# Patient Record
Sex: Female | Born: 1960
Health system: Southern US, Community
[De-identification: ages and names within clinical notes are randomized; demographics above are authoritative.]

## PROBLEM LIST (undated history)

## (undated) DIAGNOSIS — E119 Type 2 diabetes mellitus without complications: Secondary | ICD-10-CM

## (undated) DIAGNOSIS — G43009 Migraine without aura, not intractable, without status migrainosus: Secondary | ICD-10-CM

## (undated) DIAGNOSIS — I2510 Atherosclerotic heart disease of native coronary artery without angina pectoris: Secondary | ICD-10-CM

## (undated) DIAGNOSIS — E785 Hyperlipidemia, unspecified: Secondary | ICD-10-CM

## (undated) DIAGNOSIS — I739 Peripheral vascular disease, unspecified: Secondary | ICD-10-CM

## (undated) DIAGNOSIS — I2699 Other pulmonary embolism without acute cor pulmonale: Secondary | ICD-10-CM

## (undated) DIAGNOSIS — F419 Anxiety disorder, unspecified: Secondary | ICD-10-CM

## (undated) DIAGNOSIS — R51 Headache: Secondary | ICD-10-CM

## (undated) DIAGNOSIS — F319 Bipolar disorder, unspecified: Secondary | ICD-10-CM

## (undated) DIAGNOSIS — I214 Non-ST elevation (NSTEMI) myocardial infarction: Secondary | ICD-10-CM

## (undated) DIAGNOSIS — I10 Essential (primary) hypertension: Secondary | ICD-10-CM

## (undated) HISTORY — DX: Peripheral vascular disease, unspecified: I73.9

## (undated) HISTORY — DX: Other pulmonary embolism without acute cor pulmonale: I26.99

## (undated) HISTORY — DX: Hyperlipidemia, unspecified: E78.5

## (undated) HISTORY — DX: Anxiety disorder, unspecified: F41.9

## (undated) HISTORY — DX: Essential (primary) hypertension: I10

## (undated) HISTORY — DX: Migraine without aura, not intractable, without status migrainosus: G43.009

## (undated) HISTORY — DX: Headache: R51

## (undated) HISTORY — DX: Non-ST elevation (NSTEMI) myocardial infarction: I21.4

## (undated) HISTORY — DX: Atherosclerotic heart disease of native coronary artery without angina pectoris: I25.10

---

## 1999-09-02 ENCOUNTER — Other Ambulatory Visit: Admission: RE | Admit: 1999-09-02 | Discharge: 1999-09-02 | Payer: Self-pay | Admitting: Family Medicine

## 2001-02-27 ENCOUNTER — Encounter: Payer: Self-pay | Admitting: Family Medicine

## 2001-02-27 ENCOUNTER — Encounter: Admission: RE | Admit: 2001-02-27 | Discharge: 2001-02-27 | Payer: Self-pay | Admitting: Family Medicine

## 2001-09-28 ENCOUNTER — Encounter: Payer: Self-pay | Admitting: Family Medicine

## 2001-09-28 ENCOUNTER — Encounter: Admission: RE | Admit: 2001-09-28 | Discharge: 2001-09-28 | Payer: Self-pay | Admitting: Family Medicine

## 2002-01-16 ENCOUNTER — Other Ambulatory Visit: Admission: RE | Admit: 2002-01-16 | Discharge: 2002-01-16 | Payer: Self-pay | Admitting: Obstetrics and Gynecology

## 2003-03-11 ENCOUNTER — Other Ambulatory Visit: Admission: RE | Admit: 2003-03-11 | Discharge: 2003-03-11 | Payer: Self-pay | Admitting: Obstetrics and Gynecology

## 2003-07-23 ENCOUNTER — Encounter (HOSPITAL_COMMUNITY): Admission: RE | Admit: 2003-07-23 | Discharge: 2003-10-17 | Payer: Self-pay | Admitting: Family Medicine

## 2004-04-02 ENCOUNTER — Other Ambulatory Visit: Admission: RE | Admit: 2004-04-02 | Discharge: 2004-04-02 | Payer: Self-pay | Admitting: Obstetrics and Gynecology

## 2004-04-24 ENCOUNTER — Emergency Department (HOSPITAL_COMMUNITY): Admission: EM | Admit: 2004-04-24 | Discharge: 2004-04-25 | Payer: Self-pay

## 2004-05-31 DIAGNOSIS — I2510 Atherosclerotic heart disease of native coronary artery without angina pectoris: Secondary | ICD-10-CM | POA: Insufficient documentation

## 2004-05-31 DIAGNOSIS — I25119 Atherosclerotic heart disease of native coronary artery with unspecified angina pectoris: Secondary | ICD-10-CM | POA: Insufficient documentation

## 2004-05-31 HISTORY — DX: Atherosclerotic heart disease of native coronary artery without angina pectoris: I25.10

## 2004-05-31 HISTORY — PX: CORONARY ARTERY BYPASS GRAFT: SHX141

## 2004-07-07 ENCOUNTER — Ambulatory Visit: Payer: Self-pay | Admitting: Cardiology

## 2004-07-07 ENCOUNTER — Inpatient Hospital Stay (HOSPITAL_COMMUNITY): Admission: EM | Admit: 2004-07-07 | Discharge: 2004-07-14 | Payer: Self-pay | Admitting: Emergency Medicine

## 2004-07-28 ENCOUNTER — Ambulatory Visit: Payer: Self-pay | Admitting: *Deleted

## 2004-10-13 ENCOUNTER — Ambulatory Visit: Payer: Self-pay | Admitting: Cardiology

## 2004-12-31 ENCOUNTER — Encounter: Admission: RE | Admit: 2004-12-31 | Discharge: 2004-12-31 | Payer: Self-pay | Admitting: Cardiothoracic Surgery

## 2005-01-03 ENCOUNTER — Emergency Department (HOSPITAL_COMMUNITY): Admission: EM | Admit: 2005-01-03 | Discharge: 2005-01-03 | Payer: Self-pay | Admitting: Emergency Medicine

## 2005-01-30 ENCOUNTER — Emergency Department (HOSPITAL_COMMUNITY): Admission: EM | Admit: 2005-01-30 | Discharge: 2005-01-30 | Payer: Self-pay | Admitting: Family Medicine

## 2005-03-03 IMAGING — CR DG CHEST 2V
2 series · 2 of 2 positions shown · non-contrast
Comparison: 07/07/04.

CLINICAL DATA: Preoperative respiratory exam.  Chest pain. 
 CHEST ? TWO VIEW:

[view not recorded (1 of 2)]
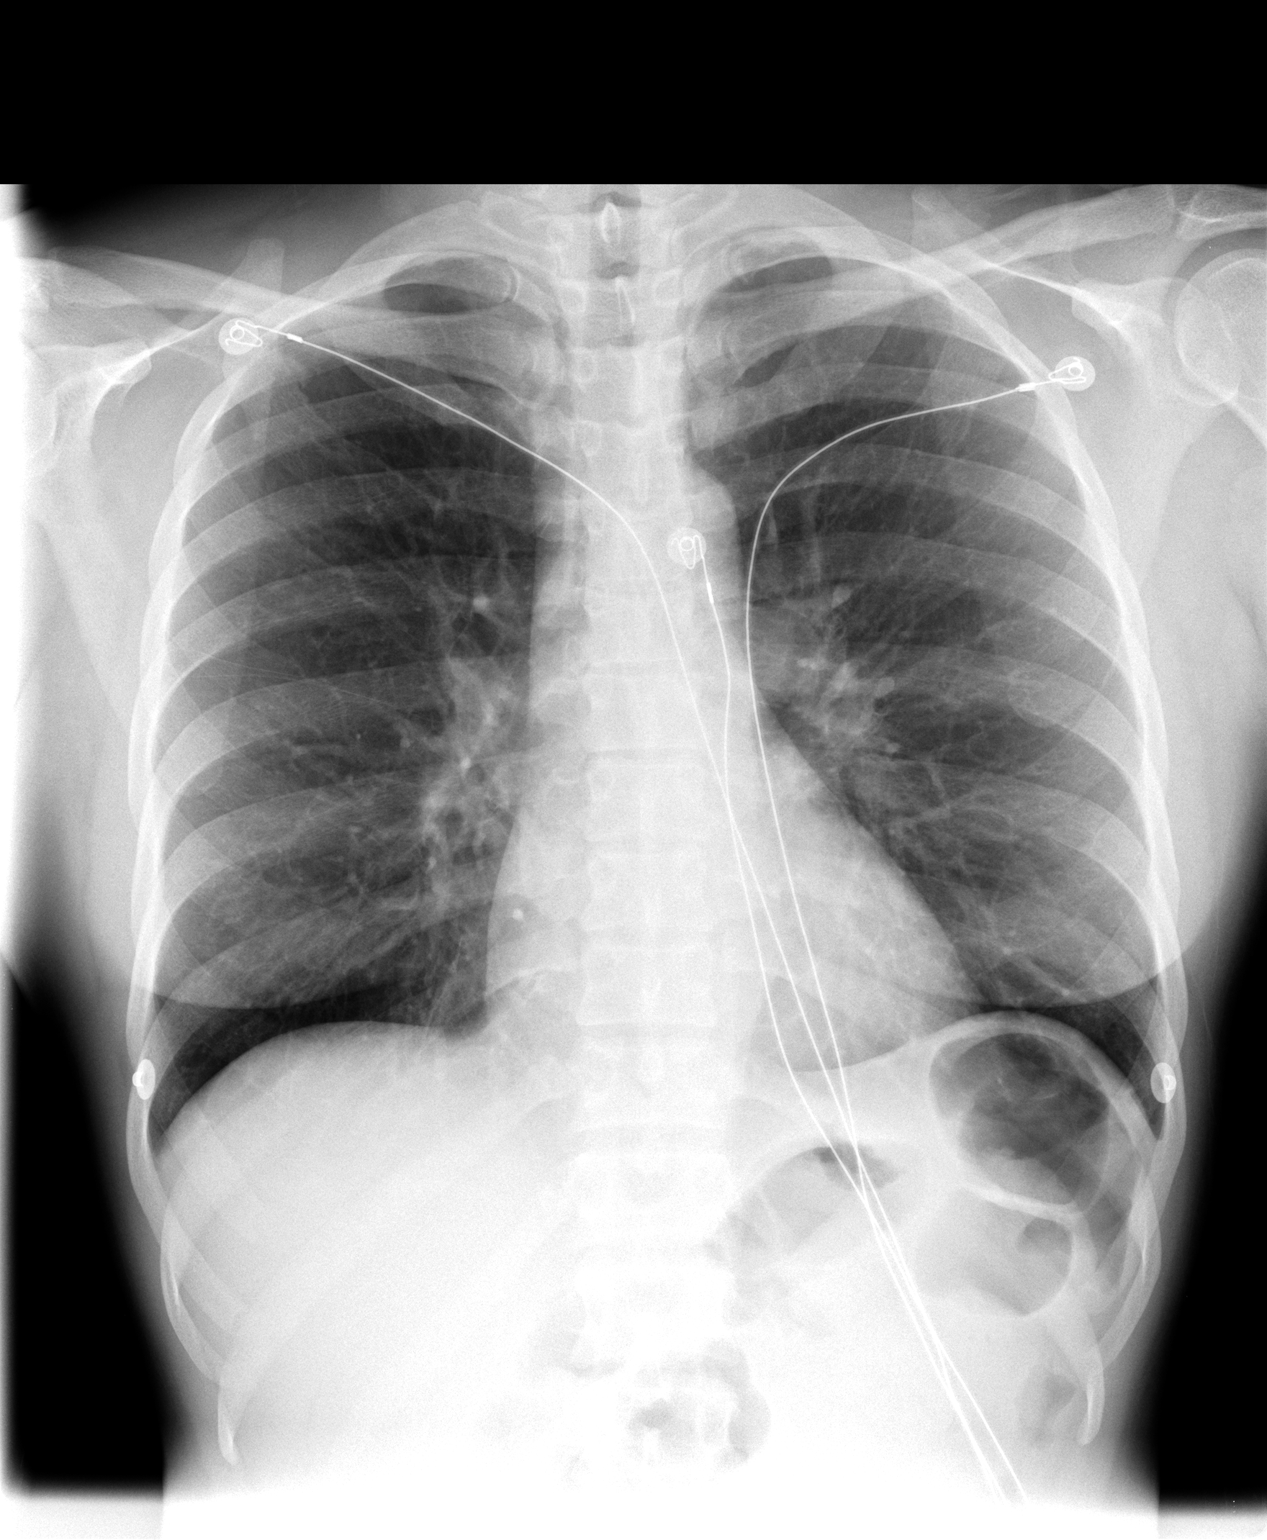

[view not recorded (2 of 2)]
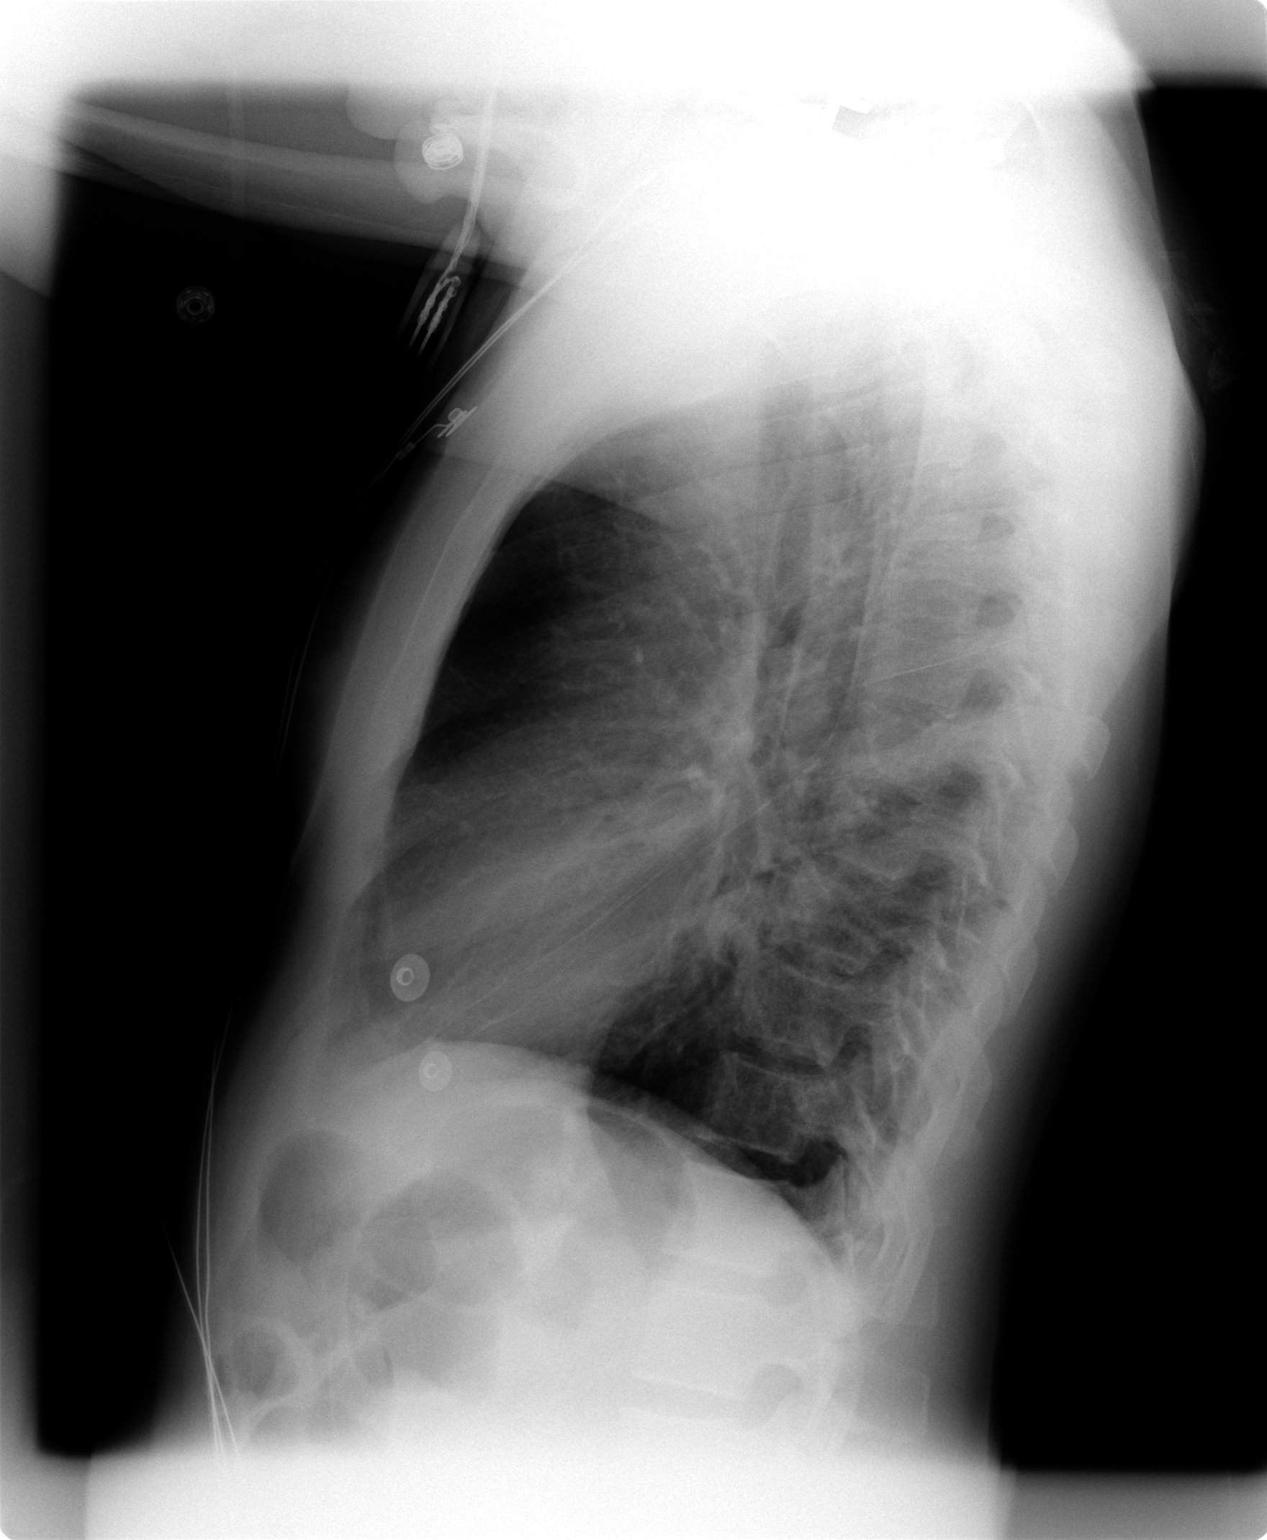

[2 of 2 positions shown; findings below may reference images not displayed]

Heart size and vascularity are normal and the lungs are clear.  No bony abnormality.
IMPRESSION: Normal chest.

## 2005-03-05 IMAGING — CR DG CHEST 1V PORT
1 series · 1 of 1 positions shown · non-contrast
Comparison: 07/09/04.

CLINICAL DATA: Postop CABG. 
 PORTABLE CHEST ONE VIEW 07/10/04:
 Exam 0666 hours.

[view not recorded]
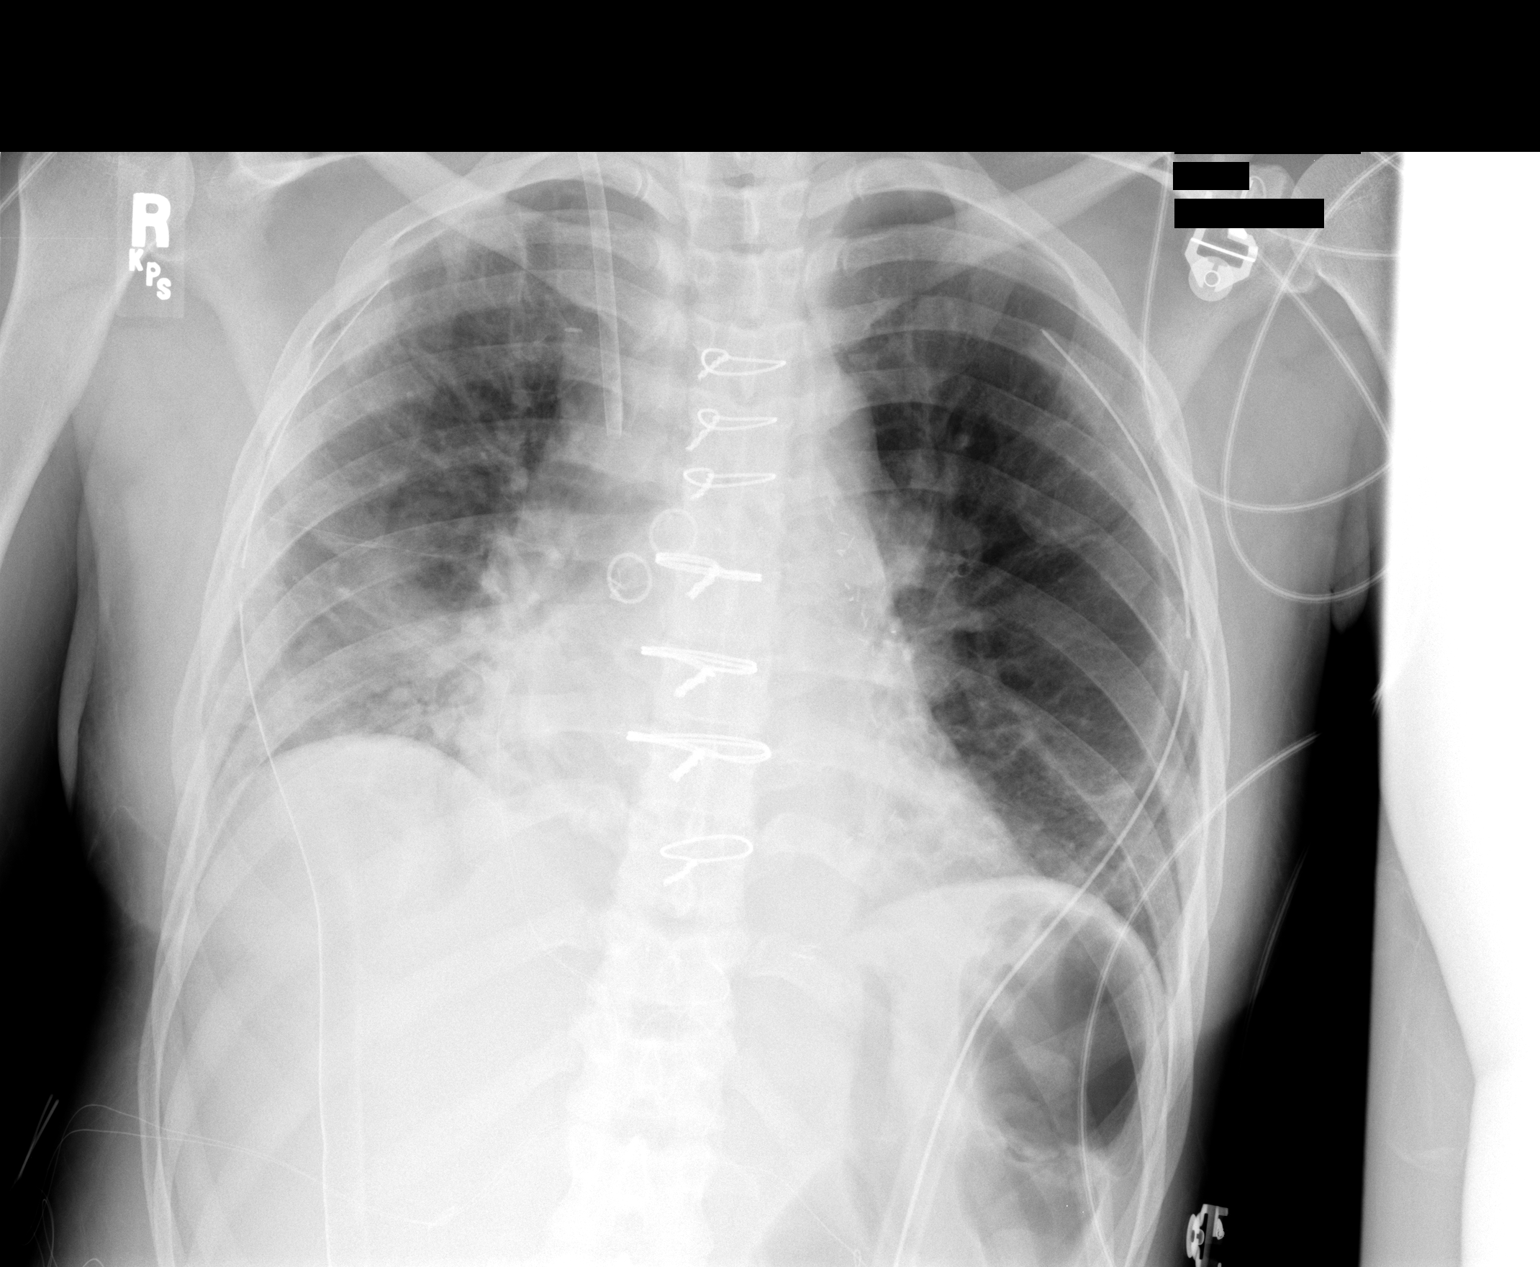

[1 of 1 positions shown; findings below may reference images not displayed]

FINDINGS: The Swan Ganz catheter and central chest tubes have been removed in the interval.  Lateral chest tubes remain bilaterally, and there is a right IJ sheath extending into the superior vena cava.  There is no pneumothorax.  Bilateral perihilar and lower lobe pulmonary opacities, remain, most compatible with atelectasis.  The aeration of the left lung base has somewhat improved.
IMPRESSION: Improving left basilar aeration and no pneumothorax following partial withdrawal of the support system.

## 2005-05-03 ENCOUNTER — Ambulatory Visit: Payer: Self-pay | Admitting: Cardiology

## 2005-05-22 ENCOUNTER — Emergency Department (HOSPITAL_COMMUNITY): Admission: EM | Admit: 2005-05-22 | Discharge: 2005-05-23 | Payer: Self-pay | Admitting: Emergency Medicine

## 2005-05-23 ENCOUNTER — Emergency Department (HOSPITAL_COMMUNITY): Admission: EM | Admit: 2005-05-23 | Discharge: 2005-05-23 | Payer: Self-pay | Admitting: *Deleted

## 2005-05-26 ENCOUNTER — Other Ambulatory Visit: Admission: RE | Admit: 2005-05-26 | Discharge: 2005-05-26 | Payer: Self-pay | Admitting: Obstetrics and Gynecology

## 2005-05-31 HISTORY — PX: CARDIAC CATHETERIZATION: SHX172

## 2005-06-23 ENCOUNTER — Ambulatory Visit: Payer: Self-pay | Admitting: Cardiology

## 2005-06-28 ENCOUNTER — Ambulatory Visit: Payer: Self-pay | Admitting: Cardiology

## 2005-06-30 ENCOUNTER — Inpatient Hospital Stay (HOSPITAL_BASED_OUTPATIENT_CLINIC_OR_DEPARTMENT_OTHER): Admission: RE | Admit: 2005-06-30 | Discharge: 2005-06-30 | Payer: Self-pay | Admitting: Cardiology

## 2005-06-30 ENCOUNTER — Ambulatory Visit: Payer: Self-pay | Admitting: Cardiology

## 2005-07-12 ENCOUNTER — Ambulatory Visit: Payer: Self-pay | Admitting: Internal Medicine

## 2005-07-23 ENCOUNTER — Ambulatory Visit: Payer: Self-pay | Admitting: Cardiology

## 2005-08-09 ENCOUNTER — Ambulatory Visit: Payer: Self-pay | Admitting: Internal Medicine

## 2005-09-28 ENCOUNTER — Ambulatory Visit: Payer: Self-pay | Admitting: Internal Medicine

## 2005-10-12 ENCOUNTER — Ambulatory Visit: Payer: Self-pay | Admitting: Internal Medicine

## 2005-10-21 ENCOUNTER — Encounter: Admission: RE | Admit: 2005-10-21 | Discharge: 2005-10-21 | Payer: Self-pay | Admitting: Cardiothoracic Surgery

## 2005-12-16 ENCOUNTER — Ambulatory Visit: Payer: Self-pay | Admitting: Cardiology

## 2005-12-31 ENCOUNTER — Ambulatory Visit (HOSPITAL_COMMUNITY): Admission: RE | Admit: 2005-12-31 | Discharge: 2005-12-31 | Payer: Self-pay | Admitting: Cardiology

## 2006-01-12 ENCOUNTER — Encounter: Admission: RE | Admit: 2006-01-12 | Discharge: 2006-01-12 | Payer: Self-pay | Admitting: Internal Medicine

## 2006-07-05 ENCOUNTER — Ambulatory Visit: Payer: Self-pay | Admitting: Cardiology

## 2006-07-05 LAB — CONVERTED CEMR LAB
ALT: 12 units/L (ref 0–40)
AST: 15 units/L (ref 0–37)
Albumin: 3.7 g/dL (ref 3.5–5.2)
Alkaline Phosphatase: 86 units/L (ref 39–117)
Bilirubin, Direct: 0.1 mg/dL (ref 0.0–0.3)
Cholesterol: 178 mg/dL (ref 0–200)
HDL: 44.2 mg/dL (ref >39.0)
LDL Cholesterol: 118 mg/dL — ABNORMAL HIGH (ref 0–99)
Total Bilirubin: 0.4 mg/dL (ref 0.3–1.2)
Total CHOL/HDL Ratio: 4
Total Protein: 6.9 g/dL (ref 6.0–8.3)
Triglycerides: 80 mg/dL (ref 0–149)
VLDL: 16 mg/dL (ref 0–40)

## 2006-08-15 ENCOUNTER — Ambulatory Visit: Payer: Self-pay | Admitting: Cardiology

## 2006-08-15 LAB — CONVERTED CEMR LAB
ALT: 13 units/L (ref 0–40)
AST: 22 units/L (ref 0–37)
Albumin: 3.7 g/dL (ref 3.5–5.2)
Alkaline Phosphatase: 77 units/L (ref 39–117)
Bilirubin, Direct: 0.1 mg/dL (ref 0.0–0.3)
Cholesterol: 125 mg/dL (ref 0–200)
HDL: 51.6 mg/dL (ref >39.0)
LDL Cholesterol: 63 mg/dL (ref 0–99)
Total Bilirubin: 0.5 mg/dL (ref 0.3–1.2)
Total CHOL/HDL Ratio: 2.4
Total Protein: 6.5 g/dL (ref 6.0–8.3)
Triglycerides: 54 mg/dL (ref 0–149)
VLDL: 11 mg/dL (ref 0–40)

## 2006-09-02 ENCOUNTER — Ambulatory Visit: Payer: Self-pay | Admitting: Internal Medicine

## 2006-09-02 LAB — CONVERTED CEMR LAB
T3, Free: 3.5 pg/mL (ref 2.3–4.2)
T4, Total: 7.2 ug/dL (ref 5.0–12.5)
TSH: 0.54 microintl units/mL (ref 0.35–5.50)

## 2006-10-08 ENCOUNTER — Emergency Department (HOSPITAL_COMMUNITY): Admission: EM | Admit: 2006-10-08 | Discharge: 2006-10-08 | Payer: Self-pay | Admitting: Emergency Medicine

## 2007-01-31 ENCOUNTER — Encounter: Payer: Self-pay | Admitting: *Deleted

## 2007-01-31 DIAGNOSIS — G43009 Migraine without aura, not intractable, without status migrainosus: Secondary | ICD-10-CM | POA: Insufficient documentation

## 2007-01-31 DIAGNOSIS — I2510 Atherosclerotic heart disease of native coronary artery without angina pectoris: Secondary | ICD-10-CM | POA: Insufficient documentation

## 2007-01-31 DIAGNOSIS — I10 Essential (primary) hypertension: Secondary | ICD-10-CM | POA: Diagnosis present

## 2007-01-31 DIAGNOSIS — E785 Hyperlipidemia, unspecified: Secondary | ICD-10-CM | POA: Insufficient documentation

## 2007-01-31 DIAGNOSIS — I25119 Atherosclerotic heart disease of native coronary artery with unspecified angina pectoris: Secondary | ICD-10-CM | POA: Insufficient documentation

## 2007-01-31 DIAGNOSIS — F411 Generalized anxiety disorder: Secondary | ICD-10-CM | POA: Insufficient documentation

## 2007-01-31 HISTORY — DX: Migraine without aura, not intractable, without status migrainosus: G43.009

## 2007-04-20 ENCOUNTER — Encounter: Admission: RE | Admit: 2007-04-20 | Discharge: 2007-04-20 | Payer: Self-pay | Admitting: Cardiothoracic Surgery

## 2007-04-20 ENCOUNTER — Ambulatory Visit: Payer: Self-pay | Admitting: Cardiothoracic Surgery

## 2007-06-09 ENCOUNTER — Emergency Department (HOSPITAL_COMMUNITY): Admission: EM | Admit: 2007-06-09 | Discharge: 2007-06-09 | Payer: Self-pay | Admitting: Emergency Medicine

## 2008-02-01 ENCOUNTER — Emergency Department (HOSPITAL_COMMUNITY): Admission: EM | Admit: 2008-02-01 | Discharge: 2008-02-01 | Payer: Self-pay | Admitting: Emergency Medicine

## 2008-02-08 ENCOUNTER — Emergency Department (HOSPITAL_COMMUNITY): Admission: EM | Admit: 2008-02-08 | Discharge: 2008-02-09 | Payer: Self-pay | Admitting: Emergency Medicine

## 2008-04-26 ENCOUNTER — Emergency Department (HOSPITAL_COMMUNITY): Admission: EM | Admit: 2008-04-26 | Discharge: 2008-04-26 | Payer: Self-pay | Admitting: Emergency Medicine

## 2008-06-12 DIAGNOSIS — R5383 Other fatigue: Secondary | ICD-10-CM

## 2008-06-12 DIAGNOSIS — R5381 Other malaise: Secondary | ICD-10-CM | POA: Insufficient documentation

## 2008-07-15 ENCOUNTER — Ambulatory Visit: Payer: Self-pay | Admitting: Cardiology

## 2008-07-18 ENCOUNTER — Ambulatory Visit: Payer: Self-pay | Admitting: Cardiology

## 2008-07-18 ENCOUNTER — Ambulatory Visit: Payer: Self-pay

## 2008-07-18 LAB — CONVERTED CEMR LAB
ALT: 16 units/L (ref 0–35)
AST: 16 units/L (ref 0–37)
Albumin: 4.1 g/dL (ref 3.5–5.2)
Alkaline Phosphatase: 87 units/L (ref 39–117)
Bilirubin, Direct: 0.1 mg/dL (ref 0.0–0.3)
Cholesterol: 216 mg/dL (ref 0–200)
Direct LDL: 145.2 mg/dL
HDL: 50.9 mg/dL (ref >39.0)
Total Bilirubin: 0.7 mg/dL (ref 0.3–1.2)
Total CHOL/HDL Ratio: 4.2
Total Protein: 7.2 g/dL (ref 6.0–8.3)
Triglycerides: 73 mg/dL (ref 0–149)
VLDL: 15 mg/dL (ref 0–40)

## 2008-09-23 ENCOUNTER — Emergency Department (HOSPITAL_COMMUNITY): Admission: EM | Admit: 2008-09-23 | Discharge: 2008-09-23 | Payer: Self-pay | Admitting: Emergency Medicine

## 2009-04-17 ENCOUNTER — Encounter (INDEPENDENT_AMBULATORY_CARE_PROVIDER_SITE_OTHER): Payer: Self-pay | Admitting: *Deleted

## 2009-07-11 LAB — HM MAMMOGRAPHY: HM Mammogram: NORMAL

## 2009-07-24 ENCOUNTER — Ambulatory Visit: Payer: Self-pay | Admitting: Cardiology

## 2009-08-08 ENCOUNTER — Telehealth: Payer: Self-pay | Admitting: Cardiology

## 2009-11-28 LAB — HM PAP SMEAR: HM Pap smear: NORMAL

## 2010-03-11 ENCOUNTER — Encounter: Admission: RE | Admit: 2010-03-11 | Discharge: 2010-03-11 | Payer: Self-pay | Admitting: Obstetrics and Gynecology

## 2010-06-20 ENCOUNTER — Encounter: Payer: Self-pay | Admitting: Obstetrics and Gynecology

## 2010-06-20 ENCOUNTER — Encounter: Payer: Self-pay | Admitting: Family Medicine

## 2010-06-21 ENCOUNTER — Encounter: Payer: Self-pay | Admitting: Internal Medicine

## 2010-06-21 ENCOUNTER — Encounter: Payer: Self-pay | Admitting: Obstetrics and Gynecology

## 2010-06-21 ENCOUNTER — Encounter: Payer: Self-pay | Admitting: Cardiothoracic Surgery

## 2010-06-30 NOTE — Assessment & Plan Note (Signed)
Summary: 1 YR F/U  Medications Added SIMVASTATIN 80 MG TABS (SIMVASTATIN) 1 by mouth daily      Allergies Added: NKDA  Visit Type:  Follow-up Primary Provider:  None  CC:  CAD.  History of Present Illness: The patient presents for followup. Since I last saw her she has had no new cardiovascular complaints. She is back to working. Her insurance with her new job has not yet kicked in. She still needs generic medications. She is exercising routinely. With her walker she does daily she is not getting any chest pressure, neck or arm discomfort. Not having any palpitations, presyncope or syncope. She's not having any PND or orthopnea. She denies any of the arm discomfort that was her previous angina. Of note I reviewed her lipid profile from last year. I had to switch her for cost considerations to simvastatin 80 mg. She has not yet had a repeat lipid profile. At that time her LDL was in the 140s.  Current Medications (verified): 1)  Simvastatin 80 Mg Tabs (Simvastatin) .Marland Kitchen.. 1 By Mouth Daily 2)  Toprol Xl 50 Mg  Tb24 (Metoprolol Succinate) .... One By Mouth Qd 3)  Adult Aspirin Low Strength 81 Mg  Tbdp (Aspirin) .... One By Mouth Qd 4)  Alprazolam 0.5 Mg  Tabs (Alprazolam) .... One Three Times A Day As Needed 5)  Nitroquick 0.4 Mg  Subl (Nitroglycerin) .... Take As Needed For Chest Pain  Allergies (verified): No Known Drug Allergies  Past History:  Past Surgical History: Coronary artery bypass   (The last catheterization was in January 2007.  The left main was normal, the LAD had focal 95% stenosis followed by mid long 75% stenosis.  The circumflex had proximal severe diffuse disease.  OM1 and OM2 had proximal severe diffuse disease.  The right coronary artery had 50% mid stenosis followed by long 70% stenosis of the PDA.  The PDA had proximal 80% stenosis.  LIMA to the LAD was widely patent, a free right internal mammary artery to the PDA was patent.  Saphenous vein graft and OM1  and OM2 sequential was widely patent, a free radial was anastomosed to the proximal segment of the saphenous vein graft.  The distal anastomosis was to the ramus intermediate.  It was free of high-grade disease.  The EF was 65%.),  Review of Systems       As stated in the HPI and negative for all other systems.   Vital Signs:  Patient profile:   50 year old female Height:      65 inches Weight:      138 pounds BMI:     23.05 Pulse rate:   71 / minute Resp:     16 per minute BP sitting:   108 / 64  (right arm)  Vitals Entered By: Marrion Coy, CNA (July 24, 2009 3:37 PM)  Physical Exam  General:  Well developed, well nourished, in no acute distress. Head:  normocephalic and atraumatic Eyes:  PERRLA/EOM intact; conjunctiva and lids normal. Mouth:  Teeth, gums and palate normal. Oral mucosa normal. Neck:  Neck supple, no JVD. No masses, thyromegaly or abnormal cervical nodes. Chest Wall:  well-healed sternotomy scar Lungs:  Clear bilaterally to auscultation and percussion. Abdomen:  Bowel sounds positive; abdomen soft and non-tender without masses, organomegaly, or hernias noted. No hepatosplenomegaly. Msk:  Back normal, normal gait. Muscle strength and tone normal. Extremities:  sore on the right radial region Neurologic:  Alert and oriented x 3. Skin:  Intact  without lesions or rashes. Cervical Nodes:  no significant adenopathy Axillary Nodes:  no significant adenopathy Inguinal Nodes:  no significant adenopathy Psych:  Normal affect.   Detailed Cardiovascular Exam  Neck    Carotids: Carotids full and equal bilaterally without bruits.      Neck Veins: Normal, no JVD.    Heart    Inspection: no deformities or lifts noted.      Palpation: normal PMI with no thrills palpable.      Auscultation: regular rate and rhythm, S1, S2 without murmurs, rubs, gallops, or clicks.    Vascular    Abdominal Aorta: no palpable masses, pulsations, or audible bruits.       Femoral Pulses: normal femoral pulses bilaterally.      Pedal Pulses: normal pedal pulses bilaterally.      Radial Pulses: absent right radial pulse, normal left      Peripheral Circulation: no clubbing, cyanosis, or edema noted with normal capillary refill.     EKG  Procedure date:  07/24/2009  Findings:      sinus rhythm, rate 71, axis within normal limits, intervals within normal limits, no acute ST-T wave changes.  Impression & Recommendations:  Problem # 1:  CORONARY ARTERY DISEASE (ICD-414.00) The patient is doing well aspect of this. She's had no new symptoms. She continues dissipated secondary risk reduction. At this point no change in therapy or further evaluation is indicated.  Problem # 2:  DYSLIPIDEMIA (ICD-272.4) She will come back in the next few days for a lipid profile. She understands the black box warning of simvastatin 80 mg but cannot afford alternatives and agrees to accept the small risk associated with this medicine. We have discussed this. If however she is not at target I will try to convince her even further to switch. I think is more dangerous to have her on no statin rather than 80 mg simvastatin.  Problem # 3:  HYPERTENSION (ICD-401.9) Herblood pressure is well controlled and she will continue to is as listed.  Patient Instructions: 1)  Your physician recommends that you schedule a follow-up appointment in: 12 months with Dr Antoine Poche 2)  Your physician recommends that you return for a FASTING lipid and liver profile: when you can  272.4  v58.69  3)  Your physician recommends that you continue on your current medications as directed. Please refer to the Current Medication list given to you today.

## 2010-06-30 NOTE — Progress Notes (Signed)
Summary: pt need somthing for pain   Phone Note Call from Patient Call back at Work Phone [phone]   Caller: Patient Summary of Call: Pt want something in for pain. Pain in right arm Initial call taken by: Judie Grieve,  August 08, 2009 3:47 PM  Follow-up for Phone Call        Unable to reach pt by phone, requested pt have pain evaluated by [facility] or Urgent Care unless she felt as though the pain is cardiac related in which case she could call MD on call which is Dr Antoine Poche, otherwise she should call back on Monday if there is anything else we can do. Follow-up by: Charolotte Capuchin, RN,  August 08, 2009 5:57 PM

## 2010-06-30 NOTE — Letter (Signed)
Summary: Appointment - Reminder 2  [facility], Main Office  [address]   [place], [place] [address]   Phone: [phone]  Fax: [phone]     April 17, 2009 MRN: [id number]   Bailey Greene [address] [place], [place]  [address]   Dear [name],  Our records indicate that it is time to schedule a follow-up appointment. Dr.Hochrein recommended that you follow up with [place] in Feb,2011 . It is very important that we reach you to schedule this appointment. We look forward to participating in your health care needs. Please contact us at the number listed above at your earliest convenience to schedule your appointment.  If you are unable to make an appointment at this time, give [place] a call so we can update our records.     Sincerely, GESILA,DAVIS  Glass blower/designer

## 2010-07-08 ENCOUNTER — Encounter: Payer: Self-pay | Admitting: Cardiology

## 2010-08-06 ENCOUNTER — Telehealth: Payer: Self-pay | Admitting: Cardiology

## 2010-08-11 NOTE — Progress Notes (Signed)
Summary: NEED TO KNOW IF PT NEED PREMEDS   Phone Note Other Incoming   Caller: DR.LUKE JOHNSON/ [phone] Summary of Call: PT HAVING TEETH PULLED AND DR.JOHNSON  NEED TO KNOW IF PT NEED PREMEDS BEFORE  DENTAL WORK Initial call taken by: Judie Grieve,  August 06, 2010 9:32 AM  Follow-up for Phone Call        I talked with Steward Drone at Dr [facility] Rummage is aware no indication according to [facility] recommendations for antibiotics prior to dental appt--pt's last OV here 07/24/09

## 2010-08-11 NOTE — Letter (Signed)
Summary: [employer]/Black Box Warning  [employer]/Black Box Warning   Imported By: Erle Crocker 08/07/2010 16:07:39  _____________________________________________________________________  External Attachment:    Type:   Image     Comment:   External Document

## 2010-09-07 ENCOUNTER — Other Ambulatory Visit: Payer: Self-pay | Admitting: Cardiology

## 2010-09-07 MED ORDER — SIMVASTATIN 80 MG PO TABS: 80.0000 mg | ORAL_TABLET | Freq: Every day | ORAL | Status: DC

## 2010-09-20 ENCOUNTER — Emergency Department (HOSPITAL_COMMUNITY)
Admission: EM | Admit: 2010-09-20 | Discharge: 2010-09-20 | Disposition: A | Discharge status: Home / Self Care | Payer: Managed Care, Other (non HMO) | Attending: Emergency Medicine | Admitting: Emergency Medicine

## 2010-09-20 DIAGNOSIS — Z79899 Other long term (current) drug therapy: Secondary | ICD-10-CM | POA: Insufficient documentation

## 2010-09-20 DIAGNOSIS — R6889 Other general symptoms and signs: Secondary | ICD-10-CM | POA: Insufficient documentation

## 2010-09-20 DIAGNOSIS — J3489 Other specified disorders of nose and nasal sinuses: Secondary | ICD-10-CM | POA: Insufficient documentation

## 2010-09-20 DIAGNOSIS — R51 Headache: Secondary | ICD-10-CM | POA: Insufficient documentation

## 2010-09-20 DIAGNOSIS — I2510 Atherosclerotic heart disease of native coronary artery without angina pectoris: Secondary | ICD-10-CM | POA: Insufficient documentation

## 2010-09-20 DIAGNOSIS — E785 Hyperlipidemia, unspecified: Secondary | ICD-10-CM | POA: Insufficient documentation

## 2010-09-20 DIAGNOSIS — K089 Disorder of teeth and supporting structures, unspecified: Secondary | ICD-10-CM | POA: Insufficient documentation

## 2010-09-20 DIAGNOSIS — I10 Essential (primary) hypertension: Secondary | ICD-10-CM | POA: Insufficient documentation

## 2010-09-20 DIAGNOSIS — K029 Dental caries, unspecified: Secondary | ICD-10-CM | POA: Insufficient documentation

## 2010-09-22 ENCOUNTER — Encounter: Payer: Self-pay | Admitting: Cardiology

## 2010-09-22 ENCOUNTER — Encounter: Payer: Self-pay | Admitting: *Deleted

## 2010-09-23 ENCOUNTER — Ambulatory Visit (INDEPENDENT_AMBULATORY_CARE_PROVIDER_SITE_OTHER): Payer: Managed Care, Other (non HMO) | Admitting: Cardiology

## 2010-09-23 ENCOUNTER — Encounter: Payer: Self-pay | Admitting: Cardiology

## 2010-09-23 VITALS — BP 120/70 | HR 82 | Ht 65.0 in | Wt 146.0 lb

## 2010-09-23 DIAGNOSIS — I10 Essential (primary) hypertension: Secondary | ICD-10-CM

## 2010-09-23 DIAGNOSIS — E785 Hyperlipidemia, unspecified: Secondary | ICD-10-CM

## 2010-09-23 DIAGNOSIS — E010 Iodine-deficiency related diffuse (endemic) goiter: Secondary | ICD-10-CM | POA: Insufficient documentation

## 2010-09-23 DIAGNOSIS — I2510 Atherosclerotic heart disease of native coronary artery without angina pectoris: Secondary | ICD-10-CM

## 2010-09-23 DIAGNOSIS — E049 Nontoxic goiter, unspecified: Secondary | ICD-10-CM

## 2010-09-23 NOTE — Assessment & Plan Note (Signed)
>>  ASSESSMENT AND PLAN FOR ESSENTIAL HYPERTENSION WRITTEN ON 09/23/2010  3:38 PM BY HOCHREIN, JAMES, MD  Her blood pressure is controlled and she will continue meds as listed.

## 2010-09-23 NOTE — Assessment & Plan Note (Signed)
Her blood pressure is controlled and she will continue meds as listed. 

## 2010-09-23 NOTE — Assessment & Plan Note (Signed)
She will come back for fasting lipid profile with a goal LDL less than 100 and HDL greater than 40.

## 2010-09-23 NOTE — Assessment & Plan Note (Signed)
There has been some time since her last catheterization (2007). She's not exercising as much as I would like. I would like to screen her with an exercise treadmill test and give her a prescription for exercise.

## 2010-09-23 NOTE — Progress Notes (Signed)
HPI The patient presents for followup of her known coronary disease. Since I last saw her she has had no new cardiovascular complaints. She has been exercising occasionally. She is not getting it chest pressure, neck or arm discomfort. She did not have any palpitations, presyncope or syncope. She is not having any PND or orthopnea. She has had no weight gain or edema. She was supposed to come back last year for lipids but apparently this was not done.  Allergies no known allergies  Current Outpatient Prescriptions  Medication Sig Dispense Refill  . metoprolol (TOPROL-XL) 50 MG 24 hr tablet Take 50 mg by mouth daily.        . simvastatin (ZOCOR) 80 MG tablet Take 80 mg by mouth at bedtime.        Marland Kitchen aspirin 81 MG tablet Take 81 mg by mouth daily.        . nitroGLYCERIN (NITROLINGUAL) 0.4 MG/SPRAY spray Place 1 spray under the tongue every 5 (five) minutes as needed.        . simvastatin (ZOCOR) 80 MG tablet Take 1 tablet (80 mg total) by mouth at bedtime.  30 tablet  4    Past Medical History  Diagnosis Date  . HTN (hypertension)   . Anxiety   . CAD (coronary artery disease)   . Dyslipidemia   . Headache     Past Surgical History  Procedure Date  . Coronary artery bypass graft      Coronary artery bypass grafting x5 with a left  internal  mammary to the left anterior descending coronary artery.  Free right  internal mammary to the diagonal coronary artery, sequential reverse  saphenous vein graft to the first and second obtuse marginal, right  artery bypass to the posterior descending coronary artery with endo-vein harvesting.    ROS:  As stated in the HPI and negative for all other systems.  PHYSICAL EXAM BP 120/70  Pulse 82  Ht 5\' 5"  (1.651 m)  Wt 146 lb (66.225 kg)  BMI 24.30 kg/m2 GENERAL:  Well appearing HEENT:  Pupils equal round and reactive, fundi not visualized, oral mucosa unremarkable, dentures NECK:  No jugular venous distention, waveform within normal limits, carotid  upstroke brisk and symmetric, no bruits, positive diffuse thyromegaly LYMPHATICS:  No cervical, inguinal adenopathy LUNGS:  Clear to auscultation bilaterally BACK:  No CVA tenderness CHEST:  Unremarkable HEART:  PMI not displaced or sustained,S1 and S2 within normal limits, no S3, no S4, no clicks, no rubs, no murmurs ABD:  Flat, positive bowel sounds normal in frequency in pitch, no bruits, no rebound, no guarding, no midline pulsatile mass, no hepatomegaly, no splenomegaly EXT:  2 plus pulses throughout except no right radial pulse, no edema, no cyanosis no clubbing, right radial scar SKIN:  No rashes no nodules NEURO:  Cranial nerves II through XII grossly intact, motor grossly intact throughout PSYCH:  Cognitively intact, oriented to person place and time   EKG:  Sinus rhythm, rate 82, axis within normal limits, intervals within normal limits, no acute ST-T wave changes.  ASSESSMENT AND PLAN

## 2010-09-23 NOTE — Patient Instructions (Signed)
You are being scheduled for a treadmill.  On that same day please have a fasting lipid panel and TSH You are being scheduled for an ultrasound of your neck due to the goiter you have Continue current medications

## 2010-09-23 NOTE — Assessment & Plan Note (Signed)
She will get a TSH and thyroid ultrasound.

## 2010-10-05 ENCOUNTER — Telehealth: Payer: Self-pay | Admitting: Internal Medicine

## 2010-10-05 NOTE — Telephone Encounter (Signed)
I cannot write a note to excuse her from work for one week for this problem. She should contact her primary care for an appt and to discuss the headache and the letter.

## 2010-10-05 NOTE — Telephone Encounter (Signed)
Pt would like to speak nurse concerning the lump on her neck.

## 2010-10-05 NOTE — Telephone Encounter (Signed)
Per pt call - states she had to go home from work today d/t a severe h/a and the only thing she notices that is different is the "lump in her neck".  Pt is scheduled for a thyroid u/s on Wednesday and wants a note to stay out of work for one week.  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  Pt is determined her headache is coming from her enlarged thyroid and continues to ask for a note to remain out of work until after the testing.  I instructed her that I will ask Dr Antoine Poche about it and call her back with recommendations.  She states understanding.

## 2010-10-06 NOTE — Telephone Encounter (Signed)
Pt aware to contact her primary care MD for evaluation of h/a and for out of work note

## 2010-10-07 ENCOUNTER — Other Ambulatory Visit (INDEPENDENT_AMBULATORY_CARE_PROVIDER_SITE_OTHER): Payer: Managed Care, Other (non HMO) | Admitting: *Deleted

## 2010-10-07 ENCOUNTER — Ambulatory Visit (HOSPITAL_COMMUNITY)
Admission: RE | Admit: 2010-10-07 | Discharge: 2010-10-07 | Disposition: A | Discharge status: Home / Self Care | Payer: Managed Care, Other (non HMO) | Source: Ambulatory Visit | Attending: Cardiology | Admitting: Cardiology

## 2010-10-07 ENCOUNTER — Encounter: Payer: Managed Care, Other (non HMO) | Admitting: *Deleted

## 2010-10-07 ENCOUNTER — Ambulatory Visit (INDEPENDENT_AMBULATORY_CARE_PROVIDER_SITE_OTHER): Payer: Managed Care, Other (non HMO) | Admitting: Cardiology

## 2010-10-07 DIAGNOSIS — I2510 Atherosclerotic heart disease of native coronary artery without angina pectoris: Secondary | ICD-10-CM

## 2010-10-07 DIAGNOSIS — I10 Essential (primary) hypertension: Secondary | ICD-10-CM

## 2010-10-07 DIAGNOSIS — E785 Hyperlipidemia, unspecified: Secondary | ICD-10-CM

## 2010-10-07 DIAGNOSIS — E049 Nontoxic goiter, unspecified: Secondary | ICD-10-CM | POA: Insufficient documentation

## 2010-10-07 LAB — TSH: TSH: 0.54 u[IU]/mL (ref 0.35–5.50)

## 2010-10-07 LAB — LIPID PANEL
Cholesterol: 163 mg/dL (ref 0–200)
HDL: 53.8 mg/dL (ref >39.00)
LDL Cholesterol: 94 mg/dL (ref 0–99)
Total CHOL/HDL Ratio: 3
Triglycerides: 74 mg/dL (ref 0.0–149.0)
VLDL: 14.8 mg/dL (ref 0.0–40.0)

## 2010-10-07 NOTE — Progress Notes (Signed)
Exercise Treadmill Test  Pre-Exercise Testing Evaluation Rhythm: sinus tachycardia  Rate: 114   PR:  .14 QRS:  .08  QT:  .32 QTc: .44     Test  Exercise Tolerance Test Ordering MD: Angelina Sheriff, MD  Interpreting MD:  Angelina Sheriff, MD  Unique Test No: 1  Treadmill:  1  Indication for ETT: CAD  Contraindication to ETT: No   Stress Modality: exercise - treadmill  Cardiac Imaging Performed: non   Protocol: standard Bruce - maximal  Max BP:  161/89  Max MPHR (bpm): 173 85% MPR (bpm):  147  MPHR obtained (bpm):  171 % MPHR obtained: 98  Reached 85% MPHR (min:sec): 3:50 Total Exercise Time (min-sec):  7:00  Workload in METS:  8.5 Borg Scale: 15  Reason ETT Terminated:  desired heart rate attained    ST Segment Analysis At Rest: normal ST segments - no evidence of significant ST depression With Exercise: no evidence of significant ST depression  Other Information Arrhythmia:  No Angina during ETT:  She did have some atypical chest discomfor mild. Quality of ETT:  diagnostic  ETT Interpretation:  normal - no evidence of ischemia by ST analysis  Comments: The patient had an reasonable exercise tolerance.  There was atypical chest pain.  There was an appropriate level of dyspnea.  There were no arrhythmias, a normal heart rate response and normal BP response.  There were no ischemic ST T wave changes and a normal heart rate recovery.  She did reach her target heart rate early in stage II.  Recommendations: Negative adequate ETT.  No further testing is indicated.  Based on the above I gave the patient a prescription for exercise.

## 2010-10-13 NOTE — Assessment & Plan Note (Signed)
[facility]                            CARDIOLOGY OFFICE NOTE   Bailey Greene, Bailey Greene            MRN:          [id number]  DATE:07/15/2008                            DOB:          26-Aug-1960    REASON FOR PRESENTATION:  Evaluate the patient with coronary artery  disease and arm pain.   HISTORY OF PRESENT ILLNESS:  This patient returns after a couple of year  absence.  She had coronary artery disease with bypass grafting in 2007.  However, she lost her job and her insurance, and so she has not followed  up with anybody.  She does still take her medications.  She has not been  exercising as much as I would like.  She is now working for [employer] in  [place].   She is concerned because she has been having some left arm discomfort.  This has been similar to what she had prior to her bypass.  It has not  been as severe.  It happens at rest.  It is moderate-to-mild discomfort.  It may last all day when it happens.  There is no associated chest  discomfort.  There is no associated nausea, vomiting, or diaphoresis.  It goes away on its own.  She was getting this sporadically, but has not  had any within a week.  She denies any PND or orthopnea.  She had no  palpitation, presyncope, or syncope.   PAST MEDICAL HISTORY:  Coronary artery disease (status post CABG.  The  last catheterization was in January 2007.  The left main was normal, the  LAD had focal 95% stenosis followed by mid long 75% stenosis.  The  circumflex had proximal severe diffuse disease.  OM1 and OM2 had  proximal severe diffuse disease.  The right coronary artery had 50% mid  stenosis followed by long 70% stenosis of the PDA.  The PDA had proximal  80% stenosis.  LIMA to the LAD was widely patent, a free right internal  mammary artery to the PDA was patent.  Saphenous vein graft and OM1 and  OM2 sequential was widely patent, a free radial was anastomosed to the  proximal segment  of the saphenous vein graft.  The distal anastomosis  was to the ramus intermediate.  It was free of high-grade disease.  The  EF was 65%.), previous tobacco use, and dyslipidemia.   ALLERGIES:  None.   MEDICATIONS:  1. Toprol 50 mg daily.  2. Lipitor 40 mg daily.  3. Aspirin 81 mg daily.   REVIEW OF SYSTEMS:  As stated in the HPI and otherwise negative for all  other systems.   PHYSICAL EXAMINATION:  GENERAL:  The patient is pleasant and in no  distress.  VITAL SIGNS:  Blood pressure 116/76, heart rate 89 and regular, weight  139 pounds, body mass index 23.  HEENT:  Eyelids unremarkable; pupils equal, round, and reactive to  light; fundi not visualized; oral mucosa unremarkable.  NECK:  No jugular venous distention at 90 degrees; carotid upstroke  brisk and symmetric; no bruits, no thyromegaly.  LYMPHATICS:  No cervical, axillary, or inguinal adenopathy.  LUNGS:  Clear to auscultation bilaterally.  BACK:  No costovertebral angle tenderness.  CHEST:  Unremarkable except for a well-healed sternotomy scar.  HEART:  PMI not displaced or sustained; S1 and S2 within normal limits;  no S3, no S4; no clicks, no rubs, no murmurs.  ABDOMEN:  Flat; positive bowel sounds, normal in frequency and pitch; no  bruits, no rebound, no guarding; no midline pulsatile mass; no  hepatomegaly, no splenomegaly.  SKIN:  No rashes, no nodules.  EXTREMITIES:  Left radial 2+ pulse, absent right radial, dorsalis pedis  2+, posterior tibialis 2+; no cyanosis, no clubbing, no edema.  NEURO:  Oriented to person, place, and time; cranial nerves II-XII  grossly intact; motor grossly intact.   EKG; sinus rhythm, rate 89, axis within normal limits, intervals within  normal limits, no acute ST-T wave changes.   ASSESSMENT AND PLAN:  1. Coronary artery disease.  The patient does have some arm discomfort      similar to previous angina.  She needs a stress perfusion study to      further evaluate her coronary  anatomy.  The possibility of      obstructive coronary artery disease causing these symptoms is at      least moderately high.  She will have an exercise perfusion study.  2. Dyslipidemia.  She has not had her lipids checked in a while.  Her      last lipid profile in 2008 demonstrated total 125, triglycerides      54, HDL 51, and LDL 63.  This was at target.  However, she wants to      switch to generic, so I am going to switch her simvastatin 80 mg      daily.  I will get a lipid profile when she comes back for her      fasting lipid or for her stress test.  3. Tobacco.  She is not smoking.  She is encouraged not to pick them      up again.  4. Headaches.  The patient did have an ER visit for headaches in      September.  These are no longer a problem.  5. Followup.  I will see her again in 1 year or sooner if she has any      problems.     Rollene Rotunda, MD, [facility]  Electronically Signed    JH/MedQ  DD: 07/15/2008  DT: 07/16/2008  Job #: [id number]

## 2010-10-13 NOTE — Assessment & Plan Note (Signed)
OFFICE VISIT   Bailey Greene, Bailey Greene  DOB:  August 20, 1960                                        April 20, 2007  CHART #:  [id number]   The patient returns today in follow-up after her coronary artery bypass  grafting x5 done in 07/2004.  During her evaluation, a 4 mm right lower  lobe, noncalcified nodule was noted in 07/2004 and again in 12/2004.  It  was a low probability scan.  The patient has been a smoker in the past  but quit more than 20 years ago.  From a cardiac standpoint, she has  been doing well.  She has had no recurrent angina or evidence of  congestive heart failure.  She has had no hemoptysis.   PHYSICAL EXAMINATION:  VITAL SIGNS:  Blood pressure is 125/81, pulse 96,  respiratory rate 18, O2 saturation 99%.  CHEST:  Her sternum is stable  and well healed.  RIGHT UPPER EXTREMITY:  The right forearm incision  from the radial artery harvest reveals she has developed a keloid in the  upper portion of it.  She had been referred to plastic surgery for this  and had some treatment with medication, but she does not remember what  it was.  Re-excision of the scar has not been attempted, but that could  be a consideration if it continues to bother her.   Today, a repeat CT scan was done and shows a stable 4 mm nodule in the  medial right lower lobe that has been followed and has been stable for  more than 2 years.  At this point, no further CT scanning to follow this  is recommended.  We discussed the findings with the patient.  I plan to  see her back p.r.n.   Sheliah Plane, MD  Electronically Signed   EG/MEDQ  D:  04/20/2007  T:  04/20/2007  Job:  [id number]   cc:   Rollene Rotunda, MD, [facility]  Lynett Fish, M.D.

## 2010-10-16 NOTE — Cardiovascular Report (Signed)
Bailey Greene, Bailey Greene NO.:  [id number]   MEDICAL RECORD NO.:  [id number]          PATIENT TYPE:  OIB   LOCATION:  1962                         FACILITY:  [facility]   PHYSICIAN:  Rollene Rotunda, M.D.   DATE OF BIRTH:  1960/07/09   DATE OF PROCEDURE:  06/30/2005  DATE OF DISCHARGE:                              CARDIAC CATHETERIZATION   PRIMARY CARE PHYSICIAN:  Thomos Lemons, D.O.   PROCEDURE:  Left heart catheterization/coronary arteriography.   INDICATIONS FOR PROCEDURE:  Evaluate patient with chest pain and previous  CABG.   PROCEDURE:  Left heart catheterization performed via the right femoral  artery.  The artery was cannulated using the anterior wall puncture.  A #4  French arterial sheath was inserted via the modified Seldinger technique.  Preformed Judkins and pigtail catheter were utilized.  Patient tolerated the  procedure well and left the lab in stable condition.   RESULTS:  Hemodynamics:  AO 95/72, LV 95/12.   Coronaries:  Left main was normal.  The LAD had severe diffuse proximal disease with focal 95% stenosis  compromising septal perforators.  There was long mid 75% stenosis.  The LAD  distal wrapped the apex.  It consisted of two small diagonals.  These  vessels were free of disease.  Circumflex in the AV groove was normal.  There was a small ramus  intermediate (referred to as a diagonal in the operative note) that had  proximal severe diffuse disease, OM-1 and OM-2 both had proximal severe  diffuse disease.  All three of these vessels were seen to fill via grafts.  The right coronary artery was a long dominant vessel.  It had proximal mid  long 50% stenosis.  There was a distal long 70% stenosis before the PDA.  The proximal portion of the PDA had 80% stenosis.  The distal vessel was  free of high grade disease and seemed to fill via a graft.   Grafts:  LIMA to the LAD was widely patent.  Free RIMA to the PDA was patent, though somewhat narrow  in caliber.  Saphenous vein graft sequential to OM-1 and OM-2 was widely patent.  A free radial appeared to be anastomosed at its proximal segment to the  origin of the saphenous vein graft.  The distal anastomosis was to the ramus intermediate (diagonal).  This was a  small graft but was patent and free of high grade disease.   Left ventriculogram:  The left ventriculogram was obtained in the RAO  projection.  EF was 65% with normal wall motion.   CONCLUSION:  Severe three vessel coronary artery disease.  Patent grafts.  Well preserved ejection fraction.   PLAN:  The patient will continue to have medical management and aggressive  risk reduction.           ______________________________  Rollene Rotunda, M.D.     JH/MEDQ  D:  06/30/2005  T:  06/30/2005  Job:  [id number]   cc:   Thomos Lemons, D.O. [facility]  [address] [place], [place] [address]

## 2010-10-16 NOTE — Consult Note (Signed)
NAME:  Bailey Greene, Bailey Greene   ACCOUNT NO.:  [id number]   MEDICAL RECORD NO.:  [id number]          PATIENT TYPE:  INP   LOCATION:  1824                         FACILITY:  [facility]   PHYSICIAN:  Rollene Rotunda, M.D.   DATE OF BIRTH:  1961-04-03   DATE OF CONSULTATION:  07/07/2004  DATE OF DISCHARGE:                                   CONSULTATION   PRIMARY CARE PHYSICIAN:  Dr. Mosetta Putt.   REASON FOR CONSULTATION:  Evaluate patient with arm pain.   HISTORY OF PRESENT ILLNESS:  The patient is a lovely 50 year old African-  American female with no prior cardiac history.  She said that two days prior  to presentation, she noticed left arm discomfort.  It started down near her  thumb and traveled up her left arm and into her upper chest.  It would come  and go, lasting several minutes at a time.  It was not provokable.  It was  not brought on by movement or activity.  It occurred at rest.  It was  severe, 10/10.  She did not have it yesterday for the most part.  However,  last night when she was getting ready to go to bed, she noticed it, and it  was again very severe.  It kept her up.  She finally presented to the  emergency room at 3:30 when it did not go away.  There, she was not noted to  have diagnostic EKG changes, though there was very minimal ST elevation in  the anterior leads and subtle T wave inversion in the anterior leads.  She  was given sublingual nitroglycerin, and she thinks this helped.  She was  also treated with morphine and Dilaudid and now is completely painfree.  We  are now consulted.  She is on heparin.  Her cardiac markers are elevated as  described below.   She says that she had no prior history before this.  She does not exercise  routinely, but she does activities of daily living without bringing on any  chest discomfort, neck discomfort, arm discomfort or activity-induced  nausea, vomiting or excessive diaphoresis.  She had no palpitations,  presyncope or syncope.   She says the discomfort that she was having is unlike any discomfort in the  past.  It is a sharp, throbbing discomfort.  It is 10/10 without associated  nausea, vomiting, diaphoresis or shortness of breath.  There has been  radiation to her upper chest but nowhere else.  She has had no jaw  discomfort.   PAST MEDICAL HISTORY:  The patient has no history of hypertension, diabetes  or hyperlipidemia.  She does have tobacco abuse.   PAST SURGICAL HISTORY:  None.   ALLERGIES:  None.   MEDICATIONS:  Triphasil birth control pills and a muscle relaxant (exact  medication unknown).   SOCIAL HISTORY:  The patient smokes 10-12 cigarettes and has done so for  about 16 years.  She is married.  She has one adult daughter, 56 years old.  She adopted her 29 year old niece.  She works outside the home.  She does  not use any illicit drugs.   FAMILY HISTORY:  Contributory for her father having his first myocardial  infarction in his 30s and dying of coronary artery disease at age 94.  Her  mother died with lupus.   REVIEW OF SYSTEMS:  As stated is in the HPI and otherwise positive for  spasms of her lumbar back.   PHYSICAL EXAMINATION:  GENERAL APPEARANCE:  The patient is in no distress.  VITAL SIGNS:  Blood pressure 105/71, heart rate 100 and regular, afebrile.  HEENT:  Eyes are unremarkable.  Pupils are equal, round and reactive to  light.  Fundi are not visualized.  Oral mucosa unremarkable.  NECK:  No jugular venous distention.  Wave form within normal limits.  Carotid upstrokes brisk and symmetric.  No bruits.  No thyromegaly.  LYMPHATIC:  No cervical, axillary or inguinal adenopathy.  LUNGS:  Clear to auscultation bilaterally.  BACK:  No costovertebral angle tenderness.  CHEST:  Unremarkable.  HEART:  PMI nondisplaced and sustained.  S1 and S2 within normal limits.  No  S3.  No S4.  No murmurs.  ABDOMEN:  Flat.  Positive bowel sounds, normal in frequency and  pitch.  No  bruits, rebound, guarding or midline pulsatile mass.  No hepatomegaly or  splenomegaly.  SKIN:  No rashes.  No nodules.  EXTREMITIES:  Two-plus pulses throughout.  No edema, cyanosis or clubbing.  NEURO:  Oriented to person, place and time.  Cranial nerves II-XII grossly  intact.  Motor grossly intact.   LABORATORY:  Sodium 139, potassium 3.7, chloride 106, BUN 8, creatinine 0.9.  WBC 10.6, hemoglobin 9.8, platelets 392.  Occult fecal blood negative.  Troponins 0.22/0.25, peak CK/MB 253/10.4 with an index of 4.1.   Chest x-ray pending.   ASSESSMENT/PLAN:  1.  Chest discomfort/elevated enzymes/abnormal EKG.  The patient has an      acute coronary syndrome.  She is being managed with heparin and aspirin,      and I will use low-dose beta blockers.  She will be taken to the      catheterization lab.  Risks and benefits have been described.  We will      check a urine pregnancy test, though she is on birth control pills and      denies being pregnant.  2.  Anemia.  The patient is slightly anemic with a negative fecal occult      blood.  We will follow this and check iron.  3.  Risk reduction.  We will get a smoking cessation consult.  We will check      a lipid panel.      JH/MEDQ  D:  07/07/2004  T:  07/07/2004  Job:  [id number]   cc:   Mosetta Putt, M.D.  [address] [place]  [place] [address]  Fax: [phone]

## 2010-10-16 NOTE — Discharge Summary (Signed)
[name], [name] NO.:  [id number]   MEDICAL RECORD NO.:  [id number]          PATIENT TYPE:  INP   LOCATION:  2006                         FACILITY:  [facility]   PHYSICIAN:  Sheliah Plane, MD    DATE OF BIRTH:  May 15, 1961   DATE OF ADMISSION:  07/07/2004  DATE OF DISCHARGE:                                 DISCHARGE SUMMARY   PRIMARY DIAGNOSIS:  Coronary artery disease.   SECONDARY DISCHARGE DIAGNOSIS:  Tobacco abuse.   IN-HOSPITAL OPERATIONS/PROCEDURES:  1.  Cardiac catheterization on July 07, 2004.  2.  Coronary artery bypass grafting x5 with a left internal mammary artery      to the left anterior descending artery, a inferior right internal      mammary to the diagonal coronary artery, a sequential reverse saphenous      vein graft to the first and second obtuse marginal, and a right radial      artery bypass to the posterior descending coronary artery.   HISTORY AND PHYSICAL AND HOSPITAL COURSE:  Bailey Greene is a 50 year old  female who presented with nausea and shortness of breath of approximately 1  month's duration with exertion.  Two days ago, she noted the onset of left  arm primarily but with radiation to the chest.  She attributed this to  carpal tunnel, she does a lot of sitting and typing at a computer.  However,  the pain came on Sunday, 2 days ago, and then again yesterday.  It returned  last evening.  She came to the emergency room on July 07, 2004.  Total CK  was 253 with an MB of 10.4.  EKG was consistent with a questionable septal  infarct.  Because of the symptoms, the patient was given nitro with some  relief and became pain-free after starting heparin.  She underwent cardiac  catheterization on July 07, 2004.  This showed an ejection fraction of 45  to 50%.  There was mid distal anterolateral wall apical and distal inferior  wall hypokinesis.  The left main was not significant.  The left anterior  descending showed a 70%  lesion, followed by 95% segmental disease.  The  circumflex showed a proximal 50%, followed by mid 80% obstruction.  The  right coronary artery had a long 60 to 70% mid lesion, followed by a 60%  narrowing proximal to the PDA.  For details of the patient's past medical  history and physical examination please see dictated history and physical.   On July 08, 2004, Ms. Bessard underwent coronary artery bypass grafting  x5 with a left internal mammary artery to the left anterior descending,  inferior right internal mammary to diagonal coronary artery, sequential  reverse saphenous vein grafts to the first and second obtuse marginal, and a  right radial artery bypass to the posterior descending artery.  The patient  tolerated the procedure well and was transferred up to the intensive care  unit in stable condition.   Postoperatively, the patient was extubated within 24 hours after surgery.  The patient had an unremarkable postoperative course.  She did have some  sinus tachycardia associated  with hypotension.  The patient was monitored  and placed on a Lopressor regimen.  Her blood pressure did elevate and her  heart rate remained slightly tachycardic in the high 90s and low 100s.  The  remainder of her hospital stay was unremarkable.  She was discharged to home  on postoperative day #6 in stable condition.  The patient was ambulating  well, positive postoperative bowel movement, appetite within normal limits.  At discharge, all of her incisions were dry and intact and healing well.   Bailey Greene received instructions on diet, activity level and care of her  incisions.  She was told that she was restricted to drive until released to  do so and no heavy lifting over 10 pounds.  She acknowledged an  understanding.  Bailey Greene was also educated on tobacco cessation since she  does have a history of tobacco abuse.  Currently, the patient has a nicotine  patch, but states that she is doing to  attempt cold [place].  A follow-up  appointment was made with Dr. Tyrone Sage for 3 weeks, July 30, 2004.  A follow-  up appointment is to be made with Ms. Laurin Coder cardiologist for 2 weeks.   DISCHARGE MEDICATIONS:  1.  Aspirin 325 mg p.o. daily.  2.  Toprol XL 25 mg p.o. daily.  3.  Lipitor 20 mg p.o. q.h.s.  4.  Tylox q.4h. p.r.n. pain.  5.  Lasix 40 mg p.o. daily x7 days.  6.  Potassium chloride 40 mEq p.o. daily x7 days.   Currently, we will hold off on starting the patient on an ACE inhibitor due  to episodes of hypotension.   Bailey Greene was given instructions if she develops any drainage or opening  of any of her incisions or any fevers, she is to contact us.  The patient  again acknowledged an understanding.      KMD/MEDQ  D:  07/13/2004  T:  07/13/2004  Job:  [id number]

## 2010-10-16 NOTE — H&P (Signed)
NAME:  Bailey Greene, Bailey Greene   ACCOUNT NO.:  [id number]   MEDICAL RECORD NO.:  [id number]          PATIENT TYPE:  EMS   LOCATION:  MAJO                         FACILITY:  [facility]   PHYSICIAN:  Gertha Calkin, M.D.DATE OF BIRTH:  November 18, 1960   DATE OF ADMISSION:  07/07/2004  DATE OF DISCHARGE:                                HISTORY & PHYSICAL   PRIMARY CARE PHYSICIAN:  Mosetta Putt, M.D.   HISTORY OF PRESENT ILLNESS:  The patient is a 50 year old pleasant African-  American female who has no significant past medical history, who presents  with atypical chest pain.  This chest pain began initially as her usual  carpal tunnel pain in the left forearm earlier yesterday morning but then  around 11:15 last night moved to her chest.  She described the pain as  sharp, nonradiating, had no associated nausea, vomiting, diaphoresis,  palpitations, neck or jaw tenderness.  Her risk factors include positive  family history.  Father had a triple bypass at age 48.  Also her father had  hypertension, diabetes.  Currently she is a tobacco user.  Unknown lipid  profile index.   PAST MEDICAL HISTORY:  None other than some lower back pain and occasional  headaches.   MEDICATIONS:  Over-the-counter ibuprofen and Motrin.  She states that she  has been using ibuprofen for her headaches and back pains approximately for  the past the three years but not routine daily use.  She denies any reflux  symptoms.   ALLERGIES:  None.   SOCIAL HISTORY:  She is married, has no alcohol or IV drug use.  Positive  tobacco, approximately half a pack a week.  She is in [employer].   FAMILY HISTORY:  Positive for father MI at 28 requiring triple bypass  surgery.  She has one brother and two sisters, no medical issues known to  her.  She lives in [place].   REVIEW OF SYSTEMS:  No reflux, headaches, blurred vision, weight changes,  night sweats, nausea, vomiting, constipation, diarrhea, no hematemesis,  hemoptysis, hematochezia or melanotic stools.  No abdominal pain, no GU  symptoms.  Other review of systems negative.  No travel history.  No recent  history of trauma or falls.  No recent surgeries.  Positive for birth  control pills.   PHYSICAL EXAMINATION:  VITAL SIGNS:  Temperature 98.2, blood pressure  118/80, pulse of 98, respiratory rate 20, satting 98% room air.  GENERAL:  She is in no acute distress at time of exam.  HEENT:  Unremarkable.  CARDIOVASCULAR:   Regular rate and rhythm, no murmurs, rubs or gallops.  CHEST:  Clear to auscultation bilaterally with good air movement.  ABDOMEN:  Soft, nontender, nondistended. No organomegaly.  No CVA  tenderness.  Positive bowel sounds.  RECTAL:  Good rectal tone, minimal stool in vault.  Heme negative.  EXTREMITIES:  Without cyanosis, clubbing or edema.  Peripheral pulses are  2+, symmetric upper and lower extremities.  NEUROLOGIC:  Cranial nerves 2 through 12 are intact.  No focal strength  deficits.  No focal sensation deficits.   LABORATORY DATA:  Chest x-ray:  No acute disease, no cardiomegaly.  CT chest  negative for pulmonary embolus or infiltrates.  EKG:  Normal sinus rhythm;  however, there are Q-waves in the anterior leads and T-wave inversion in V1  and V2 and EKG changes consistent with left atrial enlargement.   i-STAT:  creatinine was 0.9.  Point of care markers positive troponin I of  0.22, high normal is 0.05.  i-STAT sodium is 139, potassium 3.7, chloride  106, BUN of 8, glucose 117, bicarb 25, hemoglobin 12.2, hematocrit 36, PTT  of 29.   ASSESSMENT/PLAN:  1.  Atypical chest pain.  2.  Tobacco abuse.  3.  Deep venous thrombosis prophylaxis and GI prophylaxis.   Plan is to admit to tele.  Rule out with enzymes, serial EKGs, check TSH,  fasting lipid profile and 2-D echocardiogram given her abnormal EKG.  Will  empirically start heparin since she is heme negative and follow up with  labs.  Provide tobacco  cessation and nicotine patch during  hospitalization.  Will consult with cardiology if needed.      JD/MEDQ  D:  07/07/2004  T:  07/07/2004  Job:  [id number]   cc:   Mosetta Putt, M.D.  [address] [place]  [place] [address]  Fax: [phone]

## 2010-10-16 NOTE — Cardiovascular Report (Signed)
NAMENEYSA, ARTS NO.:  [id number]   MEDICAL RECORD NO.:  [id number]          PATIENT TYPE:  INP   LOCATION:  1824                         FACILITY:  [facility]   PHYSICIAN:  Arturo Morton. Riley Kill, M.D. [facility] OF BIRTH:  [birth year]   DATE OF PROCEDURE:  07/07/2004  DATE OF DISCHARGE:                              CARDIAC CATHETERIZATION   INDICATIONS:  This is a 50 year old who presents with positive enzymes,  abnormal EKG. She was brought to the catheterization laboratory for urgent  evaluation.   PROCEDURE:  1.  Left heart catheterization.  2.  Selective coronary arteriography.  3.  Selective left ventriculography.  4.  Subclavian angiography.   PROCEDURE:  The patient was brought to the cath lab, prepped and draped in  usual fashion.  Through an anterior puncture, her right femoral artery was  easily entered, a 6-French sheath was placed. Ventriculography was performed  in the RAO projection, and left coronary arteriography was then performed.  During the middle of the procedure, the drapes came up off of the leg, and  the right femoral sheath was then replaced with double-glove technique.  Following this, RCA angiography was performed, followed by bilateral  subclavian angiography. She tolerated the procedure without complication.  The femoral sheath was sewn into place.  Dr. Antoine Poche came to the  laboratory, and it was a consensus opinion that revascularization surgery  should be warranted. I called the CVTS office, and Verlon Au made arrangements  to contact Dr. Tyrone Sage to see her promptly. Intravenous heparin was then  administered, and femoral sheath was sewn into place. She was taken to the  holding area.  I reviewed the films with the family in the viewing room in  detail.   HEMODYNAMIC DATA:  1.  Central aortic pressure 100/72, mean 86.  2.  Left ventricle 99/15.  3.  No gradient pullback across the aortic valve.   ANGIOGRAPHIC DATA:  .  1.   Ventriculography was performed in the RAO projection. Estimated ejection      fraction would be in the 45-50% range. There is mid distal anterolateral      wall, apical and distal inferior wall hypokinesis.  2.  The left main is free of critical disease.  3.  Left anterior descending artery demonstrates 30% proximal narrowing.      There is then diffuse narrowing of 70% crossing the origin of the      diagonal and then a 95% area of segmental disease. There is 70%      narrowing of the large diagonal branch. This represents second diagonal.  4.  The circumflex demonstrates a subtotally occluded small first marginal.      Distal to this, the vessel then trifurcates.  The superior branch has      about 50% narrowing, then 80% mid stenosis. The inferior branch has 40%      ostial narrowing and then probably about 70% mid eccentric stenosis. The      posterolateral branch is smaller and has 75-80% narrowing.  5.  The right coronary artery is a vessel that provides some      collateralization  to the LAD system and has long 60-70% mid-narrowing,      60% narrowing proximal to the PDA.  The proximal PDA has 40%, then there      is a 50% area of narrowing going into the posterolateral system.   CONCLUSION:  1.  Mild reduction in left ventricular function.  2.  Severe critical three-vessel coronary artery disease.   DISPOSITION:  The patient needs revascularization surgery. Surgical consult  was being obtained.      TDS/MEDQ  D:  07/07/2004  T:  07/07/2004  Job:  [id number]   cc:   Rollene Rotunda, M.D.   Mosetta Putt, M.D.  [address] [place]  [place] [address]  Fax: [phone]   CV Lab

## 2010-10-16 NOTE — Letter (Signed)
August 26, 2008    Bailey Greene. Bailey Overlie, MD  [address]  [place], [place] [address]   RE:  Bailey Greene  MRN:  [id number]  /  DOB:  09/08/1960   Dear Dr. Marcelle Greene:   This is a letter concerning Bailey Greene.  The patient is  under my care for coronary disease status post bypass surgery.  She did  have some chest discomfort earlier this year.  However, she had a stress  perfusion study in February that demonstrated a well-preserved ejection  fraction of 69% with no evidence of ischemia or infarct.  Therefore,  based on this and according to ACC/AHA guidelines, the patient is at  acceptable risk for planned hysterectomy.  She should continue the  medications as listed.   If you have any questions about this patient, please do not hesitate to  give me a call.  My cell phone number is [phone].  My beeper number is  [phone].    Sincerely,      Rollene Rotunda, MD, [facility]  Electronically Signed    JH/MedQ  DD: 08/26/2008  DT: 08/27/2008  Job #: [id number]

## 2010-10-16 NOTE — Consult Note (Signed)
NAMESAVANA, SPINA NO.:  [id number]   MEDICAL RECORD NO.:  [id number]          PATIENT TYPE:  INP   LOCATION:  1824                         FACILITY:  [facility]   PHYSICIAN:  Sheliah Plane, MD    DATE OF BIRTH:  11/10/1960   DATE OF CONSULTATION:  07/07/2004  DATE OF DISCHARGE:                                   CONSULTATION   REFERRING PHYSICIAN:  Arturo Morton. Riley Kill, M.D.   PRIMARY CARE PHYSICIAN:  Mosetta Putt, M.D.   REASON FOR CONSULTATION:  Severe coronary artery disease.   HISTORY OF PRESENT ILLNESS:  The patient is a 50 year old female who  presented with mild shortness of breath of approximately 1 month's duration  with exertion.  Two days ago, she noted the onset of left arm pain  primarily, but with radiation into the chest.  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  However, the pain came and went on Sunday, 2 days ago, and then  again yesterday.  It returned last night.  She came to the emergency room  this morning.  Total CK was 253 with MB of 10.4.  EKG was consistent with a  question of a septal infarct.  Because of the symptoms, she was given  nitroglycerin with some relief of symptoms and then became pain free after  starting heparin.  She underwent cardiac catheterization today.  She has no  previous cardiac history.  She has no known hypertension, no known diabetes.  She does smoke x12-13 years with approximately 10 cigarettes per day.  She  is unaware of her lipid status.   FAMILY HISTORY:  Father had myocardial infarction at age 109.  Her mother  died of lupus.  She has one brother age 70, two sisters 63 and 56.  One  sister has lupus and diabetes.  One sister has lupus and sickle cell.   PAST MEDICAL HISTORY:  The patient has had no previous stroke, no  claudication or renal insufficiency.  She has had no previous  hospitalizations and no other medical problems.   PAST  SURGICAL HISTORY:  None.   SOCIAL HISTORY:  She has two children ages 19 and 64.  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  She is married.   MEDICATIONS:  1.  Birth control pills only.  2.  Occasional Advil.   ALLERGIES:  No known drug allergies.   REVIEW OF SYSTEMS:  CARDIOVASCULAR:  Positive for chest pain and mild  exertional shortness of breath.  She denies resting shortness of breath,  denies orthopnea, presyncope, syncope, palpitations or lower extremity  edema.  CONSTITUTIONAL:  No fevers, chills or weight loss.  RESPIRATORY:  Mild shortness of breath with exertion.  GASTROINTESTINAL:  No change in  bowel habits.  No blood in her stool.  MUSCULOSKELETAL:  No indications of  arthritis.  GENITOURINARY:  No urinary symptoms.  Denies any recent  infections.  HEMATOLOGIC:  No problems.  She notes that she has been tested  for sickle cell and this  was negative.  She has one sister with sickle cell.  PSYCHIATRIC:  Denies history.  Denies claudication.  The rest of review of  systems is negative.   PHYSICAL EXAMINATION:  VITAL SIGNS:  Blood pressure 121/78, pulse 86,  respirations 16.  GENERAL:  The patient is awake, alert and neurologically intact.  HEENT:  Pupils equal round and reactive to light.  NECK:  Without jugular venous distention.  LUNGS:  Clear bilaterally.  CARDIAC:  Regular rate and rhythm without murmurs, rubs or gallops.  ABDOMEN:  Benign without palpable masses.  EXTREMITIES:  The patient has a right femoral sheath in.  Full left femoral  pulses.  She has no pedal edema.  Bilateral PT/DP pulses.  The patient is  left-handed.  Left and right hand have both full radial and ulnar pulses.  Dopplers pending.   LABORATORY DATA AND X-RAY FINDINGS:  Sodium 138, creatinine 0.8, BUN 7.  Troponin I 0.62, CK53, MB 10.4.  Urine pregnancy test is negative.  PT is  12.2.  Lipid profile is pending.  Total cholesterol 186.   Cardiac  catheterization was performed by Dr. Riley Kill and reviewed.  The  patient had 40% ejection fraction with significant anterior apical  hypokinesis.  She has a 90-95% LAD lesion, 70% diagonal lesion, very small  intermediate and OM-1 with 50-60% stenosis at trifurcation, 70-80% mid  lesion.  The right coronary artery has a long 60-80% stenosis in the body of  the right coronary and 50-60% at the takeoff of the PD with a reasonable  distal vessel.   RECOMMENDATIONS:  With the patient's unstable anginal presentation,  decreased left ventricular function and significant three-vessel coronary  artery disease not amenable to angioplasty, agree with the recommendation of  coronary artery bypass grafting.  The risks and options have been discussed  with the patient, her husband and daughter in detail.  The risks of death,  stroke, infection, myocardial infarction, bleeding and blood transfusion  have all been discussed.  The patient has had her questions answered and is  willing to proceed.  She currently is pain free, but with her unstable  anginal presentation, will plan to proceed with bypass surgery on this  admission.  Plan for surgery tomorrow morning.  Will continue on heparin  until that time.  Also discussed with the patient the possible use of radial  arteries.  Will obtain preop Doppler studies.  The risk of neurovascular  compromise in the hand was discussed with the patient.  She is left-handed.      EG/MEDQ  D:  07/07/2004  T:  07/07/2004  Job:  [id number]

## 2010-10-16 NOTE — Op Note (Signed)
Bailey Greene, NAM NO.:  [id number]   MEDICAL RECORD NO.:  [id number]          PATIENT TYPE:  INP   LOCATION:  2315                         FACILITY:  [facility]   PHYSICIAN:  Sheliah Plane, MD    DATE OF BIRTH:  05-12-1961   DATE OF PROCEDURE:  07/08/2004  DATE OF DISCHARGE:                                 OPERATIVE REPORT   PREOPERATIVE DIAGNOSIS:  Coronary occlusive disease with unstable angina.   POSTOPERATIVE DIAGNOSIS:  Coronary occlusive disease with unstable angina.   SURGICAL PROCEDURE:  Coronary artery bypass grafting x5 with a left internal  mammary to the left anterior descending coronary artery.  Free right  internal mammary to the diagonal coronary artery, sequential reverse  saphenous vein graft to the first and second obtuse marginal, right radial  artery bypass to the posterior descending coronary artery with endo-vein  harvesting.   SURGEON:  Dr. Tyrone Sage.   FIRST ASSISTANT:  Toribio Harbour, Nurse Practitioner.   BRIEF HISTORY:  The patient is a 50 year old female who presented with a  several day history of unstable anginal symptoms.  Cardiac catheterization  was performed by Dr. Riley Kill which demonstrated greater than 90% stenosis of  the LAD and diagonal, 80% stenosis of the second OM and 70% stenosis of the  first OM and distal circumflex involving a trifurcation, diffuse 70% disease  throughout the right coronary artery with anterior apical hypokinesis.  Because of the patient's unstable anginal symptoms and severe three vessel  coronary artery disease, coronary artery bypass grafting was recommended to  the patient who agreed and signed informed consent.  The patient was left-  handed and types frequently with her work.  Palmar arches were intact  bilaterally.   The patient underwent general endotracheal anesthesia without incident.  The  skin of the chest and legs was prepped with Betadine and draped in the usual  sterile  manner.  The right arm was prepped out also.  Using the curvilinear  incision over the volar aspect of the right forearm, the radial artery was  identified and dissection carried down onto the radial artery and using the  harmonics scalpel, the radial artery was harvested and was of excellent  quality and caliber.  Proximally and distally, the vessel ends were divided  and ligated.  The incision was then closed with a running 3-0 Vicryl suture  in the subcutaneous tissue and a 4-0 subcuticular stitch in the skin edges.  A median sternotomy was performed and the left internal mammary artery was  dissected down its pedicle graft.  The distal artery was divided, had good  free flow.  The vessel was hydrostatically dilated with heparinized saline.  In a similar fashion, the right internal mammary artery was dissected down  as initially as a pedicle graft however, this mammary artery was slightly  shorter and as a pedicle graft, was not of sufficient length to reach the  posterior descending so the vessel was divided proximally and used as a free  graft to the diagonal.  Both the right mammary and the radial were flushed  with heparinized saline with _________ blood.  The pericardium was opened.  The patient had evidence of anterior apical hypokinesis.  She was  systemically heparinized.  The ascending aorta and the right atrium were  cannulated and the aortic root underwent cardioplegia.  The needle was  introduced into the ascending aorta.  The patient was placed on  cardiopulmonary bypass at 2.4 liters per minute/m2.  The sites of the  anastomosis were selected and dissected out epicardially.  The patient's  body temperature was cooled to 30 degrees.  The aortic cross clamp was about  500 cc and cold blood potassium cardioplegia was administered with rapid  diastolic arrest _________ myocardial septal temperature was monitored  throughout the cross clamp.  Attention was turned first to the OM1  and OM2  vessels.  Each of these were opened and admitted a 1.5 mm probe.  Using the  diamond-type, a side-to-side anastomosis was carried out with a second  reverse saphenous vein graft to the first distal part of the first OM.  The  distal extent of the same vein was then carried onto the distal part of the  second OM which was also anastomosed with a running 7-0 Prolene.  Attention  was then turned to the diagonal coronary artery which was opened and was 1.3  to 1.4 mm in size.  Using a running 8-0 Prolene, the free right internal  mammary artery was anastomosed to the diagonal coronary artery.  Attention  was then turned to the posterior descending which was opened and admitted a  1.5 mm probe.  Using a running 8-0 Prolene, the right radial artery graft  was anastomosed to the posterior descending coronary artery.  Attention was  then turned to the left anterior descending coronary artery which was opened  and that portion of the vessel admitted a 1.5 mm probe distally.  Using a  running 8-0 Prolene, the left internal mammary artery was anastomosed to the  left anterior descending coronary artery.  Throughout the procedure,  intermittent blood cardioplegia was administered around the grafts, aortic  cross clamp was removed with a total cross clamp time of 69 minutes.  The  patient spontaneously converted to a sinus rhythm.  Prior to removal of the  cross clamp, the bulldog was removed from the mammary artery with no  evidence of appropriate rise of myocardial septal temperature.  A partial  occlusion clamp was placed on the ascending aorta. Two punch aortotomies  were performed.  The vein graft to the OM1 and 2 was anastomosed to the  ascending aorta.  Off the hood of this vein graft was brought the hood of  the free right internal mammary artery to the diagonal.  Through a separate  punch aortotomy, the proximal right radial artery graft was anastomosed to the ascending aorta.  Air was  evacuated from the grafts and a partial  occlusion clamp was removed.  The sites of anastomosis were inspected and  free of bleeding.  The patient was then ventilated, weaned from  cardiopulmonary bypass and low dose Dopamine.  She remained hemodynamically  stable, was decannulated in the usual fashion.  Protamine sulfate was  administered.  Two right ventricular pacing wires were applied.  Graft  markers were applied.  The fluoroscopy tubes were placed in the left and  right chest. Two mediastinal tubes were left in place.  The pericardium was  reapproximated.  The sternum was closed with a #6 stainless steel wire.  The  fascia was closed with interrupted 0 Vicryl, a running  3-0 Vicryl in the  subcutaneous tissue, a4-0 subcuticular stitch in the skin edges.  Dry  dressings were applied.  Sponge and needle was reported as correct at the  completion of the procedure.  Because of low hematocrit while on bypass, the  patient did require packed red blood cells and because of diffuse oozing  post pump platelet administration was also carried out.      EG/MEDQ  D:  07/09/2004  T:  07/09/2004  Job:  [id number]   cc:   Rollene Rotunda, M.D.

## 2010-10-16 NOTE — Assessment & Plan Note (Signed)
[facility]                              CARDIOLOGY OFFICE NOTE   [name], Greene            MRN:          [id number]  DATE:12/16/2005                            DOB:          1960-12-05    PRIMARY CARE PHYSICIAN:  Barbette Hair. Artist Pais, DO   REASON FOR EVALUATION:  Evaluate patient for coronary disease and fatigue.   HISTORY OF PRESENT ILLNESS:  The patient presents for follow up.  She has  done relatively well since Bailey last saw her, though, she continues to complain  of fatigue.  This keeps her from exercising as much as she knows she should.  She tries to get on a treadmill a couple of times a week for about a half  hour at a time.  With this, she does not have any chest discomfort, neck  discomfort, arm discomfort, activity induced nausea, vomiting or excessive  diaphoresis.  She has no palpitations, presyncope or syncope.  However, she  does not think her exercise tolerance is as it should be.  She says she  sleeps okay.   Of note, she did quit her job which is causing stress and probably related  to her chest pain.   PAST MEDICAL HISTORY:  1.  Coronary artery disease (see the July 23, 2005, note for details).      The patient had a LIMA to the LAD, RIMA to the PDA, saphenous vein graft      sequential to the first obtuse marginal and second obtuse marginal and a      pre-radial to an intermediate.  EF 65%.  2.  Previous tobacco use.  3.  Dyslipidemia.   ALLERGIES:  None.   CURRENT MEDICATIONS:  1.  Aspirin 81 mg daily.  2.  Lipitor 40 mg q.h.s.  3.  Toprol XL 50 mg daily.   REVIEW OF SYSTEMS:  As stated in the HPI, otherwise, negative for other  systems.   PHYSICAL EXAMINATION:  GENERAL APPEARANCE:  The patient is in no distress.  VITAL SIGNS:  Blood pressure 104/66, heart rate 102 and regular, weight 162  pounds.  HEENT:  Eyes:  Unremarkable.  Pupils equal, round and reactive to light.  Fundi not visualized.  Oral  mucosa unremarkable .  NECK:  No JVD, wave form within normal limits.  Carotid upstrokes brisk and  symmetric.  No bruits, thyromegaly.  LYMPHATICS:  No cervical, axillary, inguinal adenopathy.  LUNGS:  Clear to auscultation bilaterally.  BACK:  No costovertebral angle tenderness.  CHEST:  Well-healed sternotomy scar.  HEART:  PMI not displaced, but sustained.  S1, S2 within normal limits.  No  S3, S4 or murmurs.  ABDOMEN:  Flat, positive bowel sounds, normal in frequency and pitch.  No  bruits, rebound, guarding.  No midline pulsatile mass or organomegaly.  SKIN:  No rashes.  No nodules.  EXTREMITIES:  Left radial 2+, Dorsalis pedis pulses 2+, posterior tibialis  2+.  Well-healed right radial harvest site with keloid.  NEUROLOGICAL:  Oriented to person, place and time.  Cranial nerves II-XII  grossly intact.  Motor grossly intact.   ASSESSMENT/PLAN:  1.  Coronary disease.  The patient is having no ongoing symptoms.  No      further cardiovascular testing is suggested.  She will continue with the      medications as listed with the exception as below.  2.  Fatigue.  She does have some thyromegaly.  Bailey am getting a TSH today.  Bailey      have also talked with Dr. Artist Pais and given the thyromegaly, she should have      ultrasound and we will arrange this.  Finally, Bailey am going to have her      stop the Lipitor just for three weeks to see if any of her fatigue      improves.  If it does, she will go back on the Lipitor to see if her      symptoms recur.  If it does not, she will go back on the Lipitor.      Ultimately, if we prove Lipitor as the culprit, Bailey would switch to      another statin.   FOLLOWUP:  Bailey will see the patient back in about four months or so if needed.                               Rollene Rotunda, MD, [facility]    JH/MedQ  DD:  12/16/2005  DT:  12/17/2005  Job #:  [id number]   cc:   Barbette Hair. Artist Pais, DO

## 2010-12-23 ENCOUNTER — Encounter: Payer: Self-pay | Admitting: Family

## 2010-12-23 ENCOUNTER — Other Ambulatory Visit: Payer: Self-pay | Admitting: Hematology & Oncology

## 2010-12-23 ENCOUNTER — Ambulatory Visit (INDEPENDENT_AMBULATORY_CARE_PROVIDER_SITE_OTHER): Payer: Managed Care, Other (non HMO) | Admitting: Family

## 2010-12-23 VITALS — BP 100/70 | HR 66 | Temp 97.8°F | Resp 16 | Ht 64.75 in | Wt 143.0 lb

## 2010-12-23 DIAGNOSIS — R609 Edema, unspecified: Secondary | ICD-10-CM

## 2010-12-23 DIAGNOSIS — M7989 Other specified soft tissue disorders: Secondary | ICD-10-CM

## 2010-12-23 DIAGNOSIS — E049 Nontoxic goiter, unspecified: Secondary | ICD-10-CM

## 2010-12-23 LAB — TSH: TSH: 0.85 u[IU]/mL (ref 0.350–4.500)

## 2010-12-23 MED ORDER — TOPIRAMATE 25 MG PO TABS: 25.0000 mg | ORAL_TABLET | Freq: Two times a day (BID) | ORAL | Status: DC

## 2010-12-23 MED ORDER — CITALOPRAM HYDROBROMIDE 20 MG PO TABS: 20.0000 mg | ORAL_TABLET | Freq: Every day | ORAL | Status: DC

## 2010-12-23 NOTE — Patient Instructions (Addendum)
Citalopram 20mg .  1/2 tab once daily for 7 days, then increase to a full tablet once daily on week two. Follow up in 1 month. Complete lab work on the first floor.

## 2010-12-23 NOTE — Progress Notes (Signed)
  Subjective:    Patient ID: Bailey Greene, female    DOB: 1961-04-22, 50 y.o.   MRN: [id number]  HPI  [name] is a 50 year old female who presents today to establish care.  1. Edema-  Pt reports + swelling in her left arm.  Notes increased pain in her left arm.  + associated soreness of the left arm.  Denies associated erythema, warmth, or fever.  Symptoms started 5 days ago.   Denies associated shortness of breath.  2. CAD- reports CABG surgery 2006 (at [facility]).  Dad- had CAD- deceased at age 69  3. Goiter- She reports that she has a history of enlarged thyroid.   4. HTN- She is currently on toprol, and well controlled. No associated S/S related to HTN.   Quality: chronic Modifying factor: meds Duration: Quite sometime Associated S/S: None.  The patient denies the following associated symptoms: Chest pain, dyspnea, blurred vision, or lower extremity edema.     Review of Systems  Constitutional: Negative for unexpected weight change.  HENT: Negative for hearing loss.   Eyes: Negative for visual disturbance.  Respiratory: Negative for shortness of breath.   Cardiovascular: Negative for chest pain.  Gastrointestinal: Negative for nausea, vomiting and diarrhea.  Genitourinary:       LMP was July   BP Readings from Last 3 Encounters:  12/23/10 100/70  09/23/10 120/70  07/24/09 108/64       Objective:   Physical Exam  Constitutional: She appears well-developed and well-nourished.  HENT:  Head: Normocephalic and atraumatic.  Cardiovascular: Normal rate and regular rhythm.   Pulmonary/Chest: Effort normal and breath sounds normal.  Musculoskeletal:       + mild swelling noted of the left forearm and hand without tenderness or erythema.  Slight prominence of veins noted in left forearm.  Psychiatric: She has a normal mood and affect. Her speech is normal and behavior is normal.          Assessment & Plan:

## 2010-12-24 ENCOUNTER — Telehealth: Payer: Self-pay | Admitting: *Deleted

## 2010-12-24 ENCOUNTER — Other Ambulatory Visit (HOSPITAL_BASED_OUTPATIENT_CLINIC_OR_DEPARTMENT_OTHER): Payer: Managed Care, Other (non HMO)

## 2010-12-24 NOTE — Telephone Encounter (Signed)
Received call from imaging that pt did not show for her ultrasound at 10:30 this morning. I have not received any messages from pt today. Attempted to contact pt at home number and left message to return my call.

## 2010-12-25 ENCOUNTER — Ambulatory Visit (HOSPITAL_BASED_OUTPATIENT_CLINIC_OR_DEPARTMENT_OTHER)
Admission: RE | Admit: 2010-12-25 | Discharge: 2010-12-25 | Disposition: A | Discharge status: Home / Self Care | Payer: Managed Care, Other (non HMO) | Source: Ambulatory Visit | Attending: Family | Admitting: Family

## 2010-12-25 ENCOUNTER — Encounter: Payer: Self-pay | Admitting: Family

## 2010-12-25 DIAGNOSIS — M7989 Other specified soft tissue disorders: Secondary | ICD-10-CM

## 2010-12-25 DIAGNOSIS — M79609 Pain in unspecified limb: Secondary | ICD-10-CM | POA: Insufficient documentation

## 2010-12-25 DIAGNOSIS — R609 Edema, unspecified: Secondary | ICD-10-CM

## 2010-12-25 NOTE — Telephone Encounter (Signed)
Pt returned my call at 5pm yesterday. U/s has been scheduled for today at 2:30pm.

## 2010-12-28 DIAGNOSIS — M7989 Other specified soft tissue disorders: Secondary | ICD-10-CM | POA: Insufficient documentation

## 2010-12-28 NOTE — Assessment & Plan Note (Signed)
TSH normal.  She had an ultrasound 4/12- results reviewed: Three small cystic lesions. Little change from prior study.  None meet criteria for biopsy.

## 2010-12-28 NOTE — Assessment & Plan Note (Signed)
No erythema, afebrile. Does not appear to be cellulitis. Clinical concern for possibility of DVT- but ultrasound was negative.

## 2011-01-20 ENCOUNTER — Telehealth: Payer: Self-pay | Admitting: *Deleted

## 2011-01-20 NOTE — Telephone Encounter (Signed)
Records received from Dr Edmon Crape and forwarded to Provider for review.

## 2011-01-27 ENCOUNTER — Encounter: Payer: Self-pay | Admitting: Family

## 2011-01-27 ENCOUNTER — Ambulatory Visit (INDEPENDENT_AMBULATORY_CARE_PROVIDER_SITE_OTHER): Payer: Managed Care, Other (non HMO) | Admitting: Family

## 2011-01-27 DIAGNOSIS — F411 Generalized anxiety disorder: Secondary | ICD-10-CM

## 2011-01-27 DIAGNOSIS — M7989 Other specified soft tissue disorders: Secondary | ICD-10-CM

## 2011-01-27 MED ORDER — PAROXETINE HCL 20 MG PO TABS: 20.0000 mg | ORAL_TABLET | Freq: Every day | ORAL | Status: DC

## 2011-01-27 NOTE — Assessment & Plan Note (Signed)
She reports no significant improvement. Will try switching her from citalopram to paxil and plan to bring her back in 4-6 weeks. If no improvement at that time consider addition of a prn benzo.

## 2011-01-27 NOTE — Patient Instructions (Signed)
Stop citalopram, start paroxetine. Follow up in 4-6 weeks.

## 2011-01-27 NOTE — Progress Notes (Signed)
Subjective:    Patient ID: Bailey Greene, female    DOB: [birth year], 50 y.o.   MRN: [id number]  HPI  Bailey Greene is a 50 yr old female who presents today for follow up.  Anxiety- reports that she gets, "overly excited about things." She reports that the citalopram- makes her sleepy.  She continues citalopram 20mg  once daily.  Symptoms occur about once a day.  Happens the most at work.  She works in Clinical biochemist.  Reports tearfulness, not every day.  About 3 times a week.  She is sleeping well once she falls asleep, though it takes her about an hour.    Swelling of left arm- Pt reports resolution.   Review of Systems See HPI  Past Medical History  Diagnosis Date  . HTN (hypertension)   . Anxiety   . CAD (coronary artery disease)   . Dyslipidemia   . Headache   . Migraine     History   Social History  . Marital Status: Legally Separated    Spouse Name: N/A    Number of Children: N/A  . Years of Education: N/A   Occupational History  . Not on file.   Social History Main Topics  . Smoking status: Former Games developer  . Smokeless tobacco: Not on file  . Alcohol Use: No  . Drug Use: No  . Sexually Active: Not on file   Other Topics Concern  . Not on file   Social History Narrative   Regular exercise:  3 days weeklyCaffeine Use:  1 soda dailyOne child biological daughter born in 43 and an adopted niece.[employer] [place]- Mortgage specialistMarried- may be divorcing.     Past Surgical History  Procedure Date  . Coronary artery bypass graft 2006     Coronary artery bypass grafting x5 with a left  internal  mammary to the left anterior descending coronary artery.  Free right  internal mammary to the diagonal coronary artery, sequential reverse  saphenous vein graft to the first and second obtuse marginal, right  artery bypass to the posterior descending coronary artery with endo-vein harvesting.  . Cesarean section 1984    Family History  Problem Relation Age of Onset   . Lupus Mother   . Heart attack Father   . Diabetes Paternal Grandmother   . Hypertension Paternal Grandmother   . Stroke Neg Hx   . Kidney disease Neg Hx     No Known Allergies  Current Outpatient Prescriptions on File Prior to Visit  Medication Sig Dispense Refill  . aspirin 81 MG tablet Take 81 mg by mouth daily.        . metoprolol (TOPROL-XL) 50 MG 24 hr tablet Take 50 mg by mouth daily.        . nitroGLYCERIN (NITROLINGUAL) 0.4 MG/SPRAY spray Place 1 spray under the tongue every 5 (five) minutes as needed.        . simvastatin (ZOCOR) 80 MG tablet Take 1 tablet (80 mg total) by mouth at bedtime.  30 tablet  4  . SUMAtriptan (IMITREX) 100 MG tablet Take 1 tablet by mouth as needed.        BP 116/76  Pulse 78  Temp(Src) 97.6 F (36.4 C) (Oral)  Resp 16  Ht 5' 4.75" (1.645 m)  Wt 143 lb 0.6 oz (64.883 kg)  BMI 23.99 kg/m2  LMP 11/28/2009       Objective:   Physical Exam  Constitutional: She appears well-developed and well-nourished.  HENT:  Head: Normocephalic  and atraumatic.  Cardiovascular: Normal rate and regular rhythm.   Pulmonary/Chest: Effort normal and breath sounds normal.  Psychiatric: She has a normal mood and affect. Her behavior is normal. Judgment and thought content normal.          Assessment & Plan:

## 2011-01-27 NOTE — Assessment & Plan Note (Signed)
Resolved

## 2011-03-03 ENCOUNTER — Ambulatory Visit: Payer: Managed Care, Other (non HMO) | Admitting: Family

## 2011-03-03 DIAGNOSIS — Z0289 Encounter for other administrative examinations: Secondary | ICD-10-CM

## 2011-03-16 ENCOUNTER — Other Ambulatory Visit: Payer: Self-pay

## 2011-03-16 MED ORDER — SIMVASTATIN 80 MG PO TABS: 80.0000 mg | ORAL_TABLET | Freq: Every day | ORAL | Status: DC

## 2011-05-19 ENCOUNTER — Other Ambulatory Visit: Payer: Self-pay | Admitting: *Deleted

## 2011-05-19 MED ORDER — METOPROLOL SUCCINATE ER 50 MG PO TB24: 50.0000 mg | ORAL_TABLET | Freq: Every day | ORAL | Status: DC

## 2011-06-21 ENCOUNTER — Ambulatory Visit (INDEPENDENT_AMBULATORY_CARE_PROVIDER_SITE_OTHER): Payer: Managed Care, Other (non HMO)

## 2011-06-21 DIAGNOSIS — N949 Unspecified condition associated with female genital organs and menstrual cycle: Secondary | ICD-10-CM

## 2011-06-21 DIAGNOSIS — M62838 Other muscle spasm: Secondary | ICD-10-CM

## 2011-07-05 ENCOUNTER — Ambulatory Visit: Payer: Managed Care, Other (non HMO)

## 2011-07-05 ENCOUNTER — Ambulatory Visit (INDEPENDENT_AMBULATORY_CARE_PROVIDER_SITE_OTHER): Payer: Managed Care, Other (non HMO) | Admitting: Internal Medicine

## 2011-07-05 VITALS — BP 109/71 | HR 76 | Temp 98.1°F | Resp 16 | Ht 65.0 in | Wt 146.0 lb

## 2011-07-05 DIAGNOSIS — M47812 Spondylosis without myelopathy or radiculopathy, cervical region: Secondary | ICD-10-CM

## 2011-07-05 DIAGNOSIS — M546 Pain in thoracic spine: Secondary | ICD-10-CM

## 2011-07-05 DIAGNOSIS — M542 Cervicalgia: Secondary | ICD-10-CM

## 2011-07-05 DIAGNOSIS — M5030 Other cervical disc degeneration, unspecified cervical region: Secondary | ICD-10-CM

## 2011-07-05 DIAGNOSIS — M545 Low back pain, unspecified: Secondary | ICD-10-CM

## 2011-07-05 MED ORDER — PREDNISONE 20 MG PO TABS: ORAL_TABLET | ORAL | Status: DC

## 2011-07-05 MED ORDER — MELOXICAM 15 MG PO TABS: 15.0000 mg | ORAL_TABLET | Freq: Every day | ORAL | Status: DC

## 2011-07-05 MED ORDER — MELOXICAM 15 MG PO TABS: 15.0000 mg | ORAL_TABLET | Freq: Every day | ORAL | Status: AC

## 2011-07-05 NOTE — Progress Notes (Signed)
  Subjective:    Patient ID: Bailey Greene, female    DOB: Aug 02, 1960, 51 y.o.   MRN: [id number]  HPI    Review of Systems     Objective:   Physical Exam       [facility] reading (PRIMARY) by  Dr. Merla Riches.narrow at 3-4 with degen changes   Assessment & Plan:

## 2011-07-05 NOTE — Progress Notes (Signed)
  Subjective:    Patient ID: Bailey Greene, female    DOB: 06/10/1960, 51 y.o.   MRN: [id number]  HPI Patient presents with persistent neck pain, worse on the left than the right. She was seen here 06/21/2011 for same. Cyclobenzaprine and Vicodin have helped only minimally and temporarily. Switched from ibuprofen to Aleve to try to increase efficacy and produced GI upset. She continues to have pain from the neck radiating down into the back on the left.  Low back pain began in the last several days after she helped her daughter move.  No paresthesias. No extremity weakness. No saddle anesthesia. No loss of bowel or bladder control.   Review of Systems  Constitutional: Negative for fever and chills.  Genitourinary: Negative for dysuria, urgency, frequency and hematuria.  Musculoskeletal: Positive for myalgias and back pain. Negative for arthralgias and gait problem.  Neurological: Negative for weakness.       Objective:   Physical Exam  Vitals reviewed. Constitutional: She is oriented to person, place, and time. She appears well-developed and well-nourished.  HENT:  Head: Normocephalic.  Eyes: Pupils are equal, round, and reactive to light.  Neck: Normal range of motion. Neck supple. No thyromegaly present.  Cardiovascular: Normal rate, regular rhythm and normal heart sounds.   Pulmonary/Chest: Effort normal and breath sounds normal.  Musculoskeletal: Normal range of motion.       Cervical back: She exhibits tenderness, pain and spasm. She exhibits normal range of motion, no bony tenderness, no swelling, no edema and no deformity.       Thoracic back: She exhibits tenderness and pain.       Back:  Neurological: She is alert and oriented to person, place, and time. She has normal strength and normal reflexes. No cranial nerve deficit or sensory deficit.  Skin: Skin is warm and dry.          Assessment & Plan:  1. 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  Prednisone taper followed by daily meloxicam. Refer for physical therapy. If symptoms persist will proceed with MRI.  2. Back pain. Exacerbated by activity. Treatment as above for neck pain.

## 2011-07-05 NOTE — Patient Instructions (Signed)
Do not take Ibuprofen or Naproxen.  Begin the Meloxicam AFTER completing the Prednisone.  You have been referred to physical therapy.  If your symptoms don't improve, we'll need to do additional evaluation.

## 2011-07-06 ENCOUNTER — Encounter: Payer: Self-pay | Admitting: Family

## 2011-07-06 ENCOUNTER — Telehealth: Payer: Self-pay | Admitting: Family

## 2011-07-06 ENCOUNTER — Ambulatory Visit (INDEPENDENT_AMBULATORY_CARE_PROVIDER_SITE_OTHER): Payer: Managed Care, Other (non HMO) | Admitting: Family

## 2011-07-06 DIAGNOSIS — Z0000 Encounter for general adult medical examination without abnormal findings: Secondary | ICD-10-CM | POA: Insufficient documentation

## 2011-07-06 LAB — HEPATIC FUNCTION PANEL
ALT: 18 U/L (ref 0–35)
AST: 19 U/L (ref 0–37)
Albumin: 5 g/dL (ref 3.5–5.2)
Alkaline Phosphatase: 93 U/L (ref 39–117)
Bilirubin, Direct: 0.2 mg/dL (ref 0.0–0.3)
Indirect Bilirubin: 0.6 mg/dL (ref 0.0–0.9)
Total Bilirubin: 0.8 mg/dL (ref 0.3–1.2)
Total Protein: 7.2 g/dL (ref 6.0–8.3)

## 2011-07-06 LAB — CBC WITH DIFFERENTIAL/PLATELET
Basophils Absolute: 0 10*3/uL (ref 0.0–0.1)
Basophils Relative: 0 % (ref 0–1)
Eosinophils Absolute: 0.1 10*3/uL (ref 0.0–0.7)
Eosinophils Relative: 2 % (ref 0–5)
HCT: 40.9 % (ref 36.0–46.0)
Hemoglobin: 14.3 g/dL (ref 12.0–15.0)
Lymphocytes Relative: 49 % — ABNORMAL HIGH (ref 12–46)
Lymphs Abs: 3 10*3/uL (ref 0.7–4.0)
MCH: 30.8 pg (ref 26.0–34.0)
MCHC: 35 g/dL (ref 30.0–36.0)
MCV: 88.1 fL (ref 78.0–100.0)
Monocytes Absolute: 0.5 10*3/uL (ref 0.1–1.0)
Monocytes Relative: 8 % (ref 3–12)
Neutro Abs: 2.5 10*3/uL (ref 1.7–7.7)
Neutrophils Relative %: 41 % — ABNORMAL LOW (ref 43–77)
Platelets: 228 10*3/uL (ref 150–400)
RBC: 4.64 MIL/uL (ref 3.87–5.11)
RDW: 13.6 % (ref 11.5–15.5)
WBC: 6.1 10*3/uL (ref 4.0–10.5)

## 2011-07-06 LAB — LIPID PANEL
Cholesterol: 166 mg/dL (ref 0–200)
HDL: 59 mg/dL (ref >39)
LDL Cholesterol: 90 mg/dL (ref 0–99)
Total CHOL/HDL Ratio: 2.8 Ratio
Triglycerides: 85 mg/dL (ref <150)
VLDL: 17 mg/dL (ref 0–40)

## 2011-07-06 LAB — TSH: TSH: 0.845 u[IU]/mL (ref 0.350–4.500)

## 2011-07-06 NOTE — Telephone Encounter (Signed)
Notified pt. 

## 2011-07-06 NOTE — Telephone Encounter (Signed)
Reviewed EKG with Dr. Antoine Poche today.  He is not concerned about TWI in V1/V2- looks "ok."   Bethann Berkshire, would you please call pt this afternoon and let her know that I reviewed ekg with Dr. Antoine Poche and he thinks it looks ok.

## 2011-07-06 NOTE — Assessment & Plan Note (Addendum)
51 yr old female for cpx today.  Will obtain fasting laboratories.  Ordered mammogram.  Reports Tdap up to date, declines flu shot. Encouraged her to continue her regular walking. EKG performed in the office is reviewed- see phone note from today.

## 2011-07-06 NOTE — Progress Notes (Signed)
Subjective:    Patient ID: Bailey Greene, female    DOB: 27-Nov-1960, 51 y.o.   MRN: [id number]  HPI  CPX-  Last tetanus-  <10 yrs ago.  Not sure where she had it. Last mammogram 2 yrs ago. Declines flu shot.   She reports that she is exercising- walking 3 times a week for 45 minutes.  Last pap smear >1 yr ago.  Declines pap smear today- she follows with GYN.    Review of Systems  Constitutional: Negative for unexpected weight change.  HENT: Positive for hearing loss.   Eyes: Negative for visual disturbance.  Respiratory: Negative for cough.   Cardiovascular: Negative for chest pain, palpitations and leg swelling.  Gastrointestinal: Negative for nausea, vomiting and diarrhea.  Genitourinary:       Last period was 2 years ago. Hot flashes have stopped  Musculoskeletal: Negative for joint swelling and arthralgias.  Neurological: Negative for headaches.  Hematological: Negative for adenopathy.  Psychiatric/Behavioral:       Denies depression/anxiety   Past Medical History  Diagnosis Date  . HTN (hypertension)   . Anxiety   . CAD (coronary artery disease)   . Dyslipidemia   . Headache   . Migraine     History   Social History  . Marital Status: Legally Separated    Spouse Name: N/A    Number of Children: N/A  . Years of Education: N/A   Occupational History  . Not on file.   Social History Main Topics  . Smoking status: Former Games developer  . Smokeless tobacco: Not on file  . Alcohol Use: No  . Drug Use: No  . Sexually Active: Not on file   Other Topics Concern  . Not on file   Social History Narrative   Regular exercise:  3 days weeklyCaffeine Use:  1 soda dailyOne child biological daughter born in 53 and an adopted niece.[employer] [place]- Mortgage specialistMarried- may be divorcing.     Past Surgical History  Procedure Date  . Coronary artery bypass graft 2006     Coronary artery bypass grafting x5 with a left  internal  mammary to the left anterior  descending coronary artery.  Free right  internal mammary to the diagonal coronary artery, sequential reverse  saphenous vein graft to the first and second obtuse marginal, right  artery bypass to the posterior descending coronary artery with endo-vein harvesting.  . Cesarean section 1984    Family History  Problem Relation Age of Onset  . Lupus Mother   . Heart attack Father   . Diabetes Paternal Grandmother   . Hypertension Paternal Grandmother   . Stroke Neg Hx   . Kidney disease Neg Hx     No Known Allergies  Current Outpatient Prescriptions on File Prior to Visit  Medication Sig Dispense Refill  . aspirin 81 MG tablet Take 81 mg by mouth daily.        . cyclobenzaprine (FLEXERIL) 5 MG tablet Take 5 mg by mouth 2 (two) times daily as needed.      . meloxicam (MOBIC) 15 MG tablet Take 1 tablet (15 mg total) by mouth daily.  30 tablet  1  . metoprolol (TOPROL-XL) 50 MG 24 hr tablet Take 1 tablet (50 mg total) by mouth daily.  30 tablet  6  . nitroGLYCERIN (NITROLINGUAL) 0.4 MG/SPRAY spray Place 1 spray under the tongue every 5 (five) minutes as needed.        . simvastatin (ZOCOR) 80 MG tablet Take  1 tablet (80 mg total) by mouth at bedtime.  30 tablet  4  . SUMAtriptan (IMITREX) 100 MG tablet Take 1 tablet by mouth as needed.        BP 100/82  Pulse 92  Temp(Src) 98.2 F (36.8 C) (Oral)  Resp 16  Wt 145 lb 0.6 oz (65.79 kg)  SpO2 98%  LMP 11/28/2009       Objective:   Physical Exam Physical Exam  Constitutional: He is oriented to person, place, and time. He appears well-developed and well-nourished. No distress.  HENT:  Head: Normocephalic and atraumatic.  Right Ear: Tympanic membrane and ear canal normal.  Left Ear: Tympanic membrane and ear canal normal.  Mouth/Throat: Oropharynx is clear and moist.  Eyes: Pupils are equal, round, and reactive to light. No scleral icterus.  Neck: Normal range of motion.  thyromegaly present R>L Cardiovascular: Normal rate and  regular rhythm.   No murmur heard. Pulmonary/Chest: Effort normal and breath sounds normal. No respiratory distress. He has no wheezes. He has no rales. He exhibits no tenderness.  Abdominal: Soft. Bowel sounds are normal. He exhibits no distension and no mass. There is no tenderness. There is no rebound and no guarding.  Musculoskeletal: He exhibits no edema.  Lymphadenopathy:    He has no cervical adenopathy.  Neurological: He is alert and oriented to person, place, and time. He has normal reflexes. He exhibits normal muscle tone. Coordination normal.  Skin: Skin is warm and dry.  Psychiatric: He has a normal mood and affect. His behavior is normal. Judgment and thought content normal.  Breasts: Examined lying Right: Without masses, retractions, discharge or axillary adenopathy.  Left: Without masses, retractions, discharge or axillary adenopathy.  Pelvic: deferred to GYN         Assessment & Plan:          Assessment & Plan:

## 2011-07-06 NOTE — Patient Instructions (Signed)
Please complete your blood work prior to leaving today.  Schedule mammogram on the first floor.  Schedule Pap smear with your GYN. Follow up in 6 months, sooner if problems or concerns.

## 2011-07-07 ENCOUNTER — Encounter: Payer: Self-pay | Admitting: Family

## 2011-07-07 LAB — BASIC METABOLIC PANEL WITH GFR
BUN: 11 mg/dL (ref 6–23)
CO2: 27 mEq/L (ref 19–32)
Calcium: 10.5 mg/dL (ref 8.4–10.5)
Chloride: 105 mEq/L (ref 96–112)
Creat: 0.87 mg/dL (ref 0.50–1.10)
GFR, Est African American: >89 mL/min
GFR, Est Non African American: 79 mL/min
Glucose, Bld: 78 mg/dL (ref 70–99)
Potassium: 4.3 mEq/L (ref 3.5–5.3)
Sodium: 142 mEq/L (ref 135–145)

## 2011-07-13 DIAGNOSIS — Z0271 Encounter for disability determination: Secondary | ICD-10-CM

## 2011-08-11 ENCOUNTER — Other Ambulatory Visit: Payer: Self-pay | Admitting: Cardiology

## 2011-08-11 MED ORDER — METOPROLOL SUCCINATE ER 50 MG PO TB24: 50.0000 mg | ORAL_TABLET | Freq: Every day | ORAL | Status: DC

## 2011-08-11 MED ORDER — SIMVASTATIN 80 MG PO TABS: 80.0000 mg | ORAL_TABLET | Freq: Every day | ORAL | Status: DC

## 2011-08-30 HISTORY — PX: BUNIONECTOMY: SHX129

## 2012-01-05 ENCOUNTER — Ambulatory Visit: Payer: Managed Care, Other (non HMO) | Admitting: Family

## 2012-02-25 ENCOUNTER — Other Ambulatory Visit: Payer: Self-pay | Admitting: Cardiology

## 2012-04-26 ENCOUNTER — Other Ambulatory Visit: Payer: Self-pay | Admitting: Cardiology

## 2012-06-22 ENCOUNTER — Encounter: Payer: Self-pay | Admitting: Family

## 2012-06-22 ENCOUNTER — Telehealth: Payer: Self-pay | Admitting: Family

## 2012-06-22 ENCOUNTER — Other Ambulatory Visit: Payer: Self-pay | Admitting: Family

## 2012-06-22 ENCOUNTER — Ambulatory Visit (INDEPENDENT_AMBULATORY_CARE_PROVIDER_SITE_OTHER): Payer: Managed Care, Other (non HMO) | Admitting: Family

## 2012-06-22 VITALS — BP 100/70 | HR 90 | Temp 98.5°F | Resp 16 | Ht 64.75 in | Wt 149.0 lb

## 2012-06-22 DIAGNOSIS — G43009 Migraine without aura, not intractable, without status migrainosus: Secondary | ICD-10-CM

## 2012-06-22 DIAGNOSIS — Z0000 Encounter for general adult medical examination without abnormal findings: Secondary | ICD-10-CM

## 2012-06-22 DIAGNOSIS — Z23 Encounter for immunization: Secondary | ICD-10-CM

## 2012-06-22 DIAGNOSIS — Z1231 Encounter for screening mammogram for malignant neoplasm of breast: Secondary | ICD-10-CM

## 2012-06-22 DIAGNOSIS — I10 Essential (primary) hypertension: Secondary | ICD-10-CM

## 2012-06-22 DIAGNOSIS — F411 Generalized anxiety disorder: Secondary | ICD-10-CM

## 2012-06-22 DIAGNOSIS — E049 Nontoxic goiter, unspecified: Secondary | ICD-10-CM

## 2012-06-22 LAB — HEPATIC FUNCTION PANEL
ALT: 23 U/L (ref 0–35)
AST: 21 U/L (ref 0–37)
Albumin: 5.1 g/dL (ref 3.5–5.2)
Alkaline Phosphatase: 115 U/L (ref 39–117)
Bilirubin, Direct: 0.1 mg/dL (ref 0.0–0.3)
Indirect Bilirubin: 0.5 mg/dL (ref 0.0–0.9)
Total Bilirubin: 0.6 mg/dL (ref 0.3–1.2)
Total Protein: 7.2 g/dL (ref 6.0–8.3)

## 2012-06-22 LAB — CBC WITH DIFFERENTIAL/PLATELET
Basophils Absolute: 0 10*3/uL (ref 0.0–0.1)
Basophils Relative: 0 % (ref 0–1)
Eosinophils Absolute: 0.1 10*3/uL (ref 0.0–0.7)
Eosinophils Relative: 1 % (ref 0–5)
HCT: 41.7 % (ref 36.0–46.0)
Hemoglobin: 14.4 g/dL (ref 12.0–15.0)
Lymphocytes Relative: 43 % (ref 12–46)
Lymphs Abs: 2.7 10*3/uL (ref 0.7–4.0)
MCH: 30.5 pg (ref 26.0–34.0)
MCHC: 34.5 g/dL (ref 30.0–36.0)
MCV: 88.3 fL (ref 78.0–100.0)
Monocytes Absolute: 0.5 10*3/uL (ref 0.1–1.0)
Monocytes Relative: 8 % (ref 3–12)
Neutro Abs: 3.1 10*3/uL (ref 1.7–7.7)
Neutrophils Relative %: 48 % (ref 43–77)
Platelets: 229 10*3/uL (ref 150–400)
RBC: 4.72 MIL/uL (ref 3.87–5.11)
RDW: 14.1 % (ref 11.5–15.5)
WBC: 6.4 10*3/uL (ref 4.0–10.5)

## 2012-06-22 LAB — BASIC METABOLIC PANEL WITH GFR
BUN: 10 mg/dL (ref 6–23)
CO2: 30 mEq/L (ref 19–32)
Calcium: 10.4 mg/dL (ref 8.4–10.5)
Chloride: 103 mEq/L (ref 96–112)
Creat: 0.72 mg/dL (ref 0.50–1.10)
GFR, Est African American: >89 mL/min
GFR, Est Non African American: >89 mL/min
Glucose, Bld: 134 mg/dL — ABNORMAL HIGH (ref 70–99)
Potassium: 4.4 mEq/L (ref 3.5–5.3)
Sodium: 139 mEq/L (ref 135–145)

## 2012-06-22 LAB — LIPID PANEL
Cholesterol: 240 mg/dL — ABNORMAL HIGH (ref 0–200)
HDL: 70 mg/dL (ref >39)
LDL Cholesterol: 148 mg/dL — ABNORMAL HIGH (ref 0–99)
Total CHOL/HDL Ratio: 3.4 Ratio
Triglycerides: 112 mg/dL (ref <150)
VLDL: 22 mg/dL (ref 0–40)

## 2012-06-22 LAB — T4, FREE: Free T4: 1.27 ng/dL (ref 0.80–1.80)

## 2012-06-22 LAB — TSH: TSH: 0.846 u[IU]/mL (ref 0.350–4.500)

## 2012-06-22 LAB — T3, FREE: T3, Free: 3.1 pg/mL (ref 2.3–4.2)

## 2012-06-22 MED ORDER — SERTRALINE HCL 50 MG PO TABS: 50.0000 mg | ORAL_TABLET | Freq: Every day | ORAL | Status: DC

## 2012-06-22 MED ORDER — METOPROLOL SUCCINATE ER 50 MG PO TB24: ORAL_TABLET | ORAL | Status: DC

## 2012-06-22 MED ORDER — KETOROLAC TROMETHAMINE 60 MG/2ML IM SOLN
60.0000 mg | Freq: Once | INTRAMUSCULAR | Status: AC
  Administered 2012-06-22: 60 mg via INTRAMUSCULAR

## 2012-06-22 MED ORDER — SUMATRIPTAN SUCCINATE 100 MG PO TABS: ORAL_TABLET | ORAL | Status: DC

## 2012-06-22 NOTE — Assessment & Plan Note (Signed)
Deteriorated.  Will continue Beta blocker for prophylaxis, IM toradol here today in office, imitrex prn (pt was advised re: rare risk of serotonin syndrome in conjunction with zoloft).

## 2012-06-22 NOTE — Assessment & Plan Note (Signed)
We discussed continuing healthy diet/exercise.  Obtain fasting labs, due for mammo/baseline Dexa.  Tdap, declines flu shot.

## 2012-06-22 NOTE — Assessment & Plan Note (Signed)
She is concerned re: decreased BP.  Will cut toprol down to 25mg  once daily.

## 2012-06-22 NOTE — Assessment & Plan Note (Signed)
>>  ASSESSMENT AND PLAN FOR ESSENTIAL HYPERTENSION WRITTEN ON 06/22/2012  9:04 AM BY O'SULLIVAN, Anthonio Mizzell, NP  She is concerned re: decreased BP.  Will cut toprol  down to 25mg  once daily.

## 2012-06-22 NOTE — Patient Instructions (Addendum)
Complete your blood work prior to leaving. Schedule bone density at the front desk.  Start Sertraline 1/2 tablet by mouth daily for one week, then increase to a full tablet once daily on second week as tolerated. Schedule mammogram and neck ultrasound on the first floor.  Please schedule a follow up appointment in 1 month.

## 2012-06-22 NOTE — Assessment & Plan Note (Signed)
Thyroid tender and swollen today.  Repeat thyroid ultrasound, TSH, free T3/T4.

## 2012-06-22 NOTE — Assessment & Plan Note (Signed)
Deteriorated. Sertraline I instructed pt to start 1/2 tablet once daily for 1 week and then increase to a full tablet once daily on week two as tolerated.  We discussed common side effects such as nausea, drowsiness and weight gain.  Also discussed rare but serious side effect of suicide ideation.  She is instructed to discontinue medication go directly to ED if this occurs.  Pt verbalizes understanding.  Plan follow up in 1 month to evaluate progress.

## 2012-06-22 NOTE — Progress Notes (Signed)
Subjective:    Patient ID: Opel Lejeune, female    DOB: 07-Nov-1960, 52 y.o.   MRN: [id number]  HPI   Patient presents today for complete physical.  LMP 2011  Immunizations: Due for tetanus.  Declines flu Diet: Diet is generally healthy Exercise: walks/excercise video 2x a week. Dexa: never Pap Smear: July 2013 normal Mammogram: due   Anxiety- reports that this has worsened.  Reports that she has been having panic attacks at work 1-2 times.  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  Migraines- since Monday- tried toprol and Excedrin migraine.  Started Monday- did not go to work on Tuesday or Wednesday.  Intermittent HA.  + nausea, + photophobia. HA worse right frontal area.   Review of Systems  Constitutional:       Has had some weight gain  HENT: Negative for hearing loss and congestion.   Eyes: Negative for visual disturbance.  Respiratory: Negative for cough.   Cardiovascular: Negative for leg swelling.  Gastrointestinal: Negative for nausea, vomiting and diarrhea.  Genitourinary: Negative for dysuria and frequency.  Musculoskeletal: Negative for myalgias and arthralgias.  Skin: Negative for rash.  Neurological: Positive for headaches.  Hematological: Does not bruise/bleed easily.  Psychiatric/Behavioral:       Denies depression   Past Medical History  Diagnosis Date  . HTN (hypertension)   . Anxiety   . CAD (coronary artery disease)   . Dyslipidemia   . Headache   . Migraine     History   Social History  . Marital Status: Legally Separated    Spouse Name: N/A    Number of Children: N/A  . Years of Education: N/A   Occupational History  . Not on file.   Social History Main Topics  . Smoking status: Former Games developer  . Smokeless tobacco: Never Used  . Alcohol Use: No  . Drug Use: No  . Sexually Active: Not on file   Other Topics Concern  . Not on file   Social History Narrative   Regular exercise:  3 days weeklyCaffeine Use:  1  soda dailyOne child biological daughter born in 25 and an adopted niece.[employer] [place]- Mortgage specialistMarried- may be divorcing.     Past Surgical History  Procedure Date  . Coronary artery bypass graft 2006     Coronary artery bypass grafting x5 with a left  internal  mammary to the left anterior descending coronary artery.  Free right  internal mammary to the diagonal coronary artery, sequential reverse  saphenous vein graft to the first and second obtuse marginal, right  artery bypass to the posterior descending coronary artery with endo-vein harvesting.  . Cesarean section 1984  . Bunionectomy 08/2011    right foot    Family History  Problem Relation Age of Onset  . Lupus Mother   . Heart attack Father   . Diabetes Paternal Grandmother   . Hypertension Paternal Grandmother   . Stroke Neg Hx   . Kidney disease Neg Hx     No Known Allergies  Current Outpatient Prescriptions on File Prior to Visit  Medication Sig Dispense Refill  . aspirin 81 MG tablet Take 81 mg by mouth daily.        . cyclobenzaprine (FLEXERIL) 5 MG tablet Take 5 mg by mouth 2 (two) times daily as needed.      . meloxicam (MOBIC) 15 MG tablet Take 1 tablet (15 mg total) by mouth daily.  30 tablet  1  .  metoprolol succinate (TOPROL-XL) 50 MG 24 hr tablet One half tablet by mouth daily  45 tablet  1  . nitroGLYCERIN (NITROLINGUAL) 0.4 MG/SPRAY spray Place 1 spray under the tongue every 5 (five) minutes as needed.        . simvastatin (ZOCOR) 80 MG tablet TAKE 1 TABLET AT BEDTIME  30 tablet  0  . SUMAtriptan (IMITREX) 100 MG tablet One tablet at start of migraine.  May repeat once 2 hours later as needed, max 2 tabs per day.  10 tablet  3  . sertraline (ZOLOFT) 50 MG tablet Take 1 tablet (50 mg total) by mouth daily.  30 tablet  0    BP 100/70  Pulse 90  Temp 98.5 F (36.9 C) (Oral)  Resp 16  Ht 5' 4.75" (1.645 m)  Wt 149 lb (67.586 kg)  BMI 24.99 kg/m2  SpO2 100%  LMP 11/28/2009         Objective:   Physical Exam  Physical Exam  Constitutional: She is oriented to person, place, and time. She appears well-developed and well-nourished. No distress.  HENT:  Head: Normocephalic and atraumatic.  Right Ear: Tympanic membrane and ear canal normal.  Left Ear: Tympanic membrane and ear canal normal.  Mouth/Throat: Oropharynx is clear and moist.  Eyes: Pupils are equal, round, and reactive to light. No scleral icterus.  Neck: Normal range of motion. Thyromegaly is noted Cardiovascular: Normal rate and regular rhythm.   No murmur heard. Pulmonary/Chest: Effort normal and breath sounds normal. No respiratory distress. He has no wheezes. She has no rales. She exhibits no tenderness.  Abdominal: Soft. Bowel sounds are normal. He exhibits no distension and no mass. There is no tenderness. There is no rebound and no guarding.  Musculoskeletal: She exhibits no edema.  Lymphadenopathy:    She has no cervical adenopathy.  Neurological: She is alert and oriented to person, place, and time. She has normal reflexes. She exhibits normal muscle tone. Coordination normal.  Skin: Skin is warm and dry.  Psychiatric: She has a normal mood and affect. Her behavior is normal. Judgment and thought content normal.  Breast pelvic- deferred to GYN        Assessment & Plan:        Assessment & Plan:

## 2012-06-22 NOTE — Telephone Encounter (Signed)
Patient is requesting a copy of the form for her physical to be mailed to her.

## 2012-06-23 LAB — URINALYSIS, ROUTINE W REFLEX MICROSCOPIC
Bilirubin Urine: NEGATIVE
Glucose, UA: NEGATIVE mg/dL
Hgb urine dipstick: NEGATIVE
Ketones, ur: NEGATIVE mg/dL
Leukocytes, UA: NEGATIVE
Nitrite: NEGATIVE
Protein, ur: NEGATIVE mg/dL
Specific Gravity, Urine: 1.014 (ref 1.005–1.030)
Urobilinogen, UA: 0.2 mg/dL (ref 0.0–1.0)
pH: 6 (ref 5.0–8.0)

## 2012-06-25 ENCOUNTER — Other Ambulatory Visit: Payer: Self-pay | Admitting: Cardiology

## 2012-06-26 ENCOUNTER — Inpatient Hospital Stay: Admission: RE | Admit: 2012-06-26 | Payer: Managed Care, Other (non HMO) | Source: Ambulatory Visit

## 2012-06-28 ENCOUNTER — Telehealth: Payer: Self-pay | Admitting: Family

## 2012-06-28 NOTE — Telephone Encounter (Signed)
Notified pt that waist circumference is missing from her form. She will get measurement and call back with info. Form forwarded to Provider for signature. Advised pt that FMLA paperwork has not been received yet. Gave fax # and asked her to refax it tomorrow. Pt needs FMLA completed for recent migraine (last Tues, Wed and Thursday and for possible future episodes as she states she is currently having headaches every day). Awaiting paperwork.

## 2012-06-28 NOTE — Telephone Encounter (Signed)
Patient had faxed FMLA forms yesterday and would like to know if Melissa had received the fax.  Also, patient wants to know if the insurance form for her physical had been faxed.

## 2012-06-29 NOTE — Telephone Encounter (Signed)
Received FMLA paperwork via fax. Copy is illegible. Left detailed message on pt's cell# requesting her to drop off a copy to our office for completion as faxed copy was unreadable.

## 2012-07-02 ENCOUNTER — Other Ambulatory Visit: Payer: Self-pay | Admitting: Cardiology

## 2012-07-04 NOTE — Telephone Encounter (Signed)
Form completed and faxed to [employer] at [phone] and mailed to pt's home address.

## 2012-07-04 NOTE — Telephone Encounter (Signed)
Both sets received and forwarded to Provider for completion.

## 2012-07-04 NOTE — Telephone Encounter (Signed)
Left detailed message informing patient that we have not received FMLA paperwork as of yet. I informed her to refax or drop paperwork off at our office.

## 2012-07-04 NOTE — Telephone Encounter (Signed)
Pt called this morning stating she was refaxing FMLA paperwork and deadline is 07/05/12. Advised pt I could not guarantee completion by that date and to contact human resources for an extension. Please call pt and let her know that I still have not received her papers.

## 2012-07-05 ENCOUNTER — Inpatient Hospital Stay: Admission: RE | Admit: 2012-07-05 | Payer: Managed Care, Other (non HMO) | Source: Ambulatory Visit

## 2012-07-05 NOTE — Addendum Note (Signed)
Addended by: Sandford Craze on: 07/05/2012 09:39 AM   Modules accepted: Orders

## 2012-07-07 ENCOUNTER — Ambulatory Visit (INDEPENDENT_AMBULATORY_CARE_PROVIDER_SITE_OTHER): Payer: Managed Care, Other (non HMO) | Admitting: Family

## 2012-07-07 ENCOUNTER — Encounter: Payer: Self-pay | Admitting: Family

## 2012-07-07 VITALS — BP 120/80 | HR 79 | Temp 99.1°F | Resp 16 | Ht 64.75 in | Wt 150.0 lb

## 2012-07-07 DIAGNOSIS — J329 Chronic sinusitis, unspecified: Secondary | ICD-10-CM | POA: Insufficient documentation

## 2012-07-07 DIAGNOSIS — G43909 Migraine, unspecified, not intractable, without status migrainosus: Secondary | ICD-10-CM

## 2012-07-07 DIAGNOSIS — G43009 Migraine without aura, not intractable, without status migrainosus: Secondary | ICD-10-CM

## 2012-07-07 MED ORDER — AMITRIPTYLINE HCL 25 MG PO TABS: 25.0000 mg | ORAL_TABLET | Freq: Every day | ORAL | Status: DC

## 2012-07-07 MED ORDER — KETOROLAC TROMETHAMINE 30 MG/ML IJ SOLN
60.0000 mg | Freq: Once | INTRAMUSCULAR | Status: AC
  Administered 2012-07-07: 60 mg via INTRAMUSCULAR

## 2012-07-07 MED ORDER — AMOXICILLIN-POT CLAVULANATE 875-125 MG PO TABS: 1.0000 | ORAL_TABLET | Freq: Two times a day (BID) | ORAL | Status: DC

## 2012-07-07 NOTE — Assessment & Plan Note (Signed)
Toradol IM in office, add elavil for prophylaxis, continue prn imitrex, beta blocker, refer to neur for further eval.

## 2012-07-07 NOTE — Assessment & Plan Note (Signed)
Low grade temp, right maxillary sinus pressure, rx with augmentin- may be contributing to HA as well.

## 2012-07-07 NOTE — Progress Notes (Signed)
Subjective:    Patient ID: Bailey Greene, female    DOB: 10-08-1960, 52 y.o.   MRN: [id number]  HPI  Bailey Greene is a 52 yr old female who presents today with chief complaint of headache.  Headache waxes and wanes.  Reports that HA is located on the right side of her face. She took excedrin.  She denies cold symptoms.  She does not some pressure adjacent to the right nostril.  She notes some improvement with imitrex, "eventually."   Review of Systems See HPI  Past Medical History  Diagnosis Date  . HTN (hypertension)   . Anxiety   . CAD (coronary artery disease)   . Dyslipidemia   . Headache   . Migraine     History   Social History  . Marital Status: Legally Separated    Spouse Name: N/A    Number of Children: N/A  . Years of Education: N/A   Occupational History  . Not on file.   Social History Main Topics  . Smoking status: Former Games developer  . Smokeless tobacco: Never Used  . Alcohol Use: No  . Drug Use: No  . Sexually Active: Not on file   Other Topics Concern  . Not on file   Social History Narrative   Regular exercise:  3 days weeklyCaffeine Use:  1 soda dailyOne child biological daughter born in 68 and an adopted niece.[employer] [place]- Mortgage specialistMarried- may be divorcing.     Past Surgical History  Procedure Date  . Coronary artery bypass graft 2006     Coronary artery bypass grafting x5 with a left  internal  mammary to the left anterior descending coronary artery.  Free right  internal mammary to the diagonal coronary artery, sequential reverse  saphenous vein graft to the first and second obtuse marginal, right  artery bypass to the posterior descending coronary artery with endo-vein harvesting.  . Cesarean section 1984  . Bunionectomy 08/2011    right foot    Family History  Problem Relation Age of Onset  . Lupus Mother   . Heart attack Father   . Diabetes Paternal Grandmother   . Hypertension Paternal Grandmother   . Stroke Neg  Hx   . Kidney disease Neg Hx     No Known Allergies  Current Outpatient Prescriptions on File Prior to Visit  Medication Sig Dispense Refill  . aspirin 81 MG tablet Take 81 mg by mouth daily.        . cyclobenzaprine (FLEXERIL) 5 MG tablet Take 5 mg by mouth 2 (two) times daily as needed.      . metoprolol succinate (TOPROL-XL) 50 MG 24 hr tablet TAKE 1 TABLET DAILY  30 tablet  0  . nitroGLYCERIN (NITROLINGUAL) 0.4 MG/SPRAY spray Place 1 spray under the tongue every 5 (five) minutes as needed.        . sertraline (ZOLOFT) 50 MG tablet Take 1 tablet (50 mg total) by mouth daily.  30 tablet  0  . simvastatin (ZOCOR) 80 MG tablet TAKE 1 TABLET AT BEDTIME  30 tablet  6  . SUMAtriptan (IMITREX) 100 MG tablet One tablet at start of migraine.  May repeat once 2 hours later as needed, max 2 tabs per day.  10 tablet  3    BP 120/80  Pulse 79  Temp 99.1 F (37.3 C) (Oral)  Resp 16  Ht 5' 4.75" (1.645 m)  Wt 150 lb (68.04 kg)  BMI 25.15 kg/m2  SpO2 99%  LMP  11/28/2009       Objective:   Physical Exam  Constitutional: She is oriented to person, place, and time. She appears well-developed and well-nourished. No distress.  HENT:  Head: Normocephalic and atraumatic.  Right Ear: Tympanic membrane and ear canal normal.  Left Ear: Tympanic membrane and ear canal normal.       R maxillary sinus tenderness to palpation  Cardiovascular: Normal rate and regular rhythm.   Pulmonary/Chest: Effort normal and breath sounds normal.  Abdominal: Soft. Bowel sounds are normal. She exhibits no distension and no mass. There is no tenderness. There is no rebound and no guarding.  Musculoskeletal: She exhibits no edema.  Neurological: She is alert and oriented to person, place, and time.  Skin: Skin is warm and dry.  Psychiatric: She has a normal mood and affect. Her behavior is normal. Judgment and thought content normal.          Assessment & Plan:

## 2012-07-07 NOTE — Telephone Encounter (Signed)
Forms completed and faxed to [facility]. Pt notified.

## 2012-07-07 NOTE — Patient Instructions (Addendum)
You will be contacted about your referral to neurology.  Please let us know if you have not heard back within 1 week about your referral. Follow up in 6 weeks, sooner if symptoms worsen or if they do not improve.

## 2012-07-07 NOTE — Addendum Note (Signed)
Addended by: Mervin Kung A on: 07/07/2012 12:17 PM   Modules accepted: Orders

## 2012-07-17 ENCOUNTER — Telehealth: Payer: Self-pay | Admitting: Family

## 2012-07-17 NOTE — Telephone Encounter (Signed)
Pt called back and left message on voicemail that she can only take Imitrex at certain times of a day and she continues to have headaches. Wants to know if we can prescribe something else for her to try? Please advise.

## 2012-07-17 NOTE — Telephone Encounter (Signed)
Patient Information:  Caller Name: Kairie  Phone: [phone]  Patient: Bailey Greene, Bailey Greene  Gender: Female  DOB: Oct 05, 1960  Age: 52 Years  PCP: Sandford Craze (Adults only)  Pregnant: No  Office Follow Up:  Does the office need to follow up with this patient?: No  Instructions For The Office: N/A  RN Note:  Pt states she has had ongoing migraine for 3 weeks.  Pain is severe.  No appts available, pt agrees to go to ED.  Symptoms  Reason For Call & Symptoms: Migraine  Reviewed Health History In EMR: Yes  Reviewed Medications In EMR: Yes  Reviewed Allergies In EMR: Yes  Reviewed Surgeries / Procedures: Yes  Date of Onset of Symptoms: 06/26/2012  Treatments Tried: Imitrex  Treatments Tried Worked: No OB / GYN:  LMP: Unknown  Guideline(s) Used:  Headache  Disposition Per Guideline:   Go to ED Now (or to Office with PCP Approval)  Reason For Disposition Reached:   Severe pain in one eye  Advice Given:  Call Back If:  You become worse.

## 2012-07-18 NOTE — Telephone Encounter (Signed)
[name], What is status of pt's neuro referral.

## 2012-07-18 NOTE — Telephone Encounter (Signed)
Patient referred to [facility], Neurology , Dr Tat will not see her,  Dr Smiley Houseman not   Credentialed ,  I sent request to [facility] yesterday

## 2012-07-23 ENCOUNTER — Encounter (HOSPITAL_COMMUNITY): Payer: Self-pay | Admitting: Emergency Medicine

## 2012-07-23 ENCOUNTER — Emergency Department (HOSPITAL_COMMUNITY)
Admission: EM | Admit: 2012-07-23 | Discharge: 2012-07-23 | Disposition: A | Discharge status: Home / Self Care | Payer: Managed Care, Other (non HMO) | Attending: Emergency Medicine | Admitting: Emergency Medicine

## 2012-07-23 DIAGNOSIS — Z87891 Personal history of nicotine dependence: Secondary | ICD-10-CM | POA: Insufficient documentation

## 2012-07-23 DIAGNOSIS — G43909 Migraine, unspecified, not intractable, without status migrainosus: Secondary | ICD-10-CM

## 2012-07-23 DIAGNOSIS — F411 Generalized anxiety disorder: Secondary | ICD-10-CM | POA: Insufficient documentation

## 2012-07-23 DIAGNOSIS — Z79899 Other long term (current) drug therapy: Secondary | ICD-10-CM | POA: Insufficient documentation

## 2012-07-23 DIAGNOSIS — Z951 Presence of aortocoronary bypass graft: Secondary | ICD-10-CM | POA: Insufficient documentation

## 2012-07-23 DIAGNOSIS — E785 Hyperlipidemia, unspecified: Secondary | ICD-10-CM | POA: Insufficient documentation

## 2012-07-23 DIAGNOSIS — I10 Essential (primary) hypertension: Secondary | ICD-10-CM | POA: Insufficient documentation

## 2012-07-23 DIAGNOSIS — I2510 Atherosclerotic heart disease of native coronary artery without angina pectoris: Secondary | ICD-10-CM | POA: Insufficient documentation

## 2012-07-23 MED ORDER — METOCLOPRAMIDE HCL 5 MG/ML IJ SOLN
10.0000 mg | Freq: Once | INTRAMUSCULAR | Status: AC
  Administered 2012-07-23: 10 mg via INTRAVENOUS
  Filled 2012-07-23: qty 2

## 2012-07-23 MED ORDER — SODIUM CHLORIDE 0.9 % IV BOLUS (SEPSIS)
1000.0000 mL | Freq: Once | INTRAVENOUS | Status: AC
  Administered 2012-07-23: 1000 mL via INTRAVENOUS

## 2012-07-23 MED ORDER — DIPHENHYDRAMINE HCL 50 MG/ML IJ SOLN
25.0000 mg | Freq: Once | INTRAMUSCULAR | Status: AC
  Administered 2012-07-23: 25 mg via INTRAVENOUS
  Filled 2012-07-23: qty 1

## 2012-07-23 MED ORDER — KETOROLAC TROMETHAMINE 30 MG/ML IJ SOLN
30.0000 mg | Freq: Once | INTRAMUSCULAR | Status: AC
  Administered 2012-07-23: 30 mg via INTRAVENOUS
  Filled 2012-07-23: qty 1

## 2012-07-23 NOTE — ED Provider Notes (Signed)
History     CSN: [id number]  Arrival date & time 07/23/12  1316   First MD Initiated Contact with Patient 07/23/12 1504      Chief Complaint  Patient presents with  . Headache    (Consider location/radiation/quality/duration/timing/severity/associated sxs/prior treatment) HPI Patient is a 52 yo female who presents with a  Headache.  The patient has had migraine headaches for 2 years.  Usually they can last hours to days, they occur on the right side of her face and complains of photosensitivity when she has these migraines.  The headache she currently has started last night.  Denies nausea, vomiting, fever, neck stiffness,weakness, numbness, changes in her migraines, trauma, blurry vision or diplopia.  Her PCP started her on Imitrex which doesn't help too much.  She has tried OTC medications but they have failed to give her any relief.  She is scheduling at appointment to go see a headache specialist.     Past Medical History  Diagnosis Date  . HTN (hypertension)   . Anxiety   . CAD (coronary artery disease)   . Dyslipidemia   . Headache   . Migraine     Past Surgical History  Procedure Laterality Date  . Coronary artery bypass graft  2006     Coronary artery bypass grafting x5 with a left  internal  mammary to the left anterior descending coronary artery.  Free right  internal mammary to the diagonal coronary artery, sequential reverse  saphenous vein graft to the first and second obtuse marginal, right  artery bypass to the posterior descending coronary artery with endo-vein harvesting.  . Cesarean section  1984  . Bunionectomy  08/2011    right foot    Family History  Problem Relation Age of Onset  . Lupus Mother   . Heart attack Father   . Diabetes Paternal Grandmother   . Hypertension Paternal Grandmother   . Stroke Neg Hx   . Kidney disease Neg Hx     History  Substance Use Topics  . Smoking status: Former Games developer  . Smokeless tobacco: Never Used  . Alcohol Use:  No    OB History   Grav Para Term Preterm Abortions TAB SAB Ect Mult Living                  Review of Systems All other systems negative except as documented in the HPI. All pertinent positives and negatives as reviewed in the HPI.  Allergies  Review of patient's allergies indicates no known allergies.  Home Medications   Current Outpatient Rx  Name  Route  Sig  Dispense  Refill  . amitriptyline (ELAVIL) 25 MG tablet   Oral   Take 1 tablet (25 mg total) by mouth at bedtime.   30 tablet   2   . amoxicillin-clavulanate (AUGMENTIN) 875-125 MG per tablet   Oral   Take 1 tablet by mouth 2 (two) times daily.   20 tablet   0   . ibuprofen (ADVIL,MOTRIN) 200 MG tablet   Oral   Take 400 mg by mouth every 6 (six) hours as needed for pain.         . metoprolol succinate (TOPROL-XL) 50 MG 24 hr tablet      TAKE 1 TABLET DAILY   30 tablet   0     Patient needs to schedule follow up appointment fo ...   . naproxen sodium (ANAPROX) 220 MG tablet   Oral   Take 440 mg by  mouth 2 (two) times daily as needed (for pain).         . simvastatin (ZOCOR) 80 MG tablet      TAKE 1 TABLET AT BEDTIME   30 tablet   6     .Marland KitchenPatient needs to contact office to schedule  App ...   . SUMAtriptan (IMITREX) 100 MG tablet      One tablet at start of migraine.  May repeat once 2 hours later as needed, max 2 tabs per day.   10 tablet   3     BP 123/86  Pulse 82  Temp(Src) 98.2 F (36.8 C)  Resp 16  SpO2 96%  LMP 11/28/2009  Physical Exam  Nursing note and vitals reviewed. Constitutional: She is oriented to person, place, and time. She appears well-developed and well-nourished. No distress.  HENT:  Head: Normocephalic and atraumatic.  Mouth/Throat: Oropharynx is clear and moist.  Eyes: Conjunctivae and EOM are normal. Pupils are equal, round, and reactive to light. Right eye exhibits no discharge. Left eye exhibits no discharge.  Neck: Normal range of motion. Neck supple.   Cardiovascular: Normal rate, regular rhythm and normal heart sounds.  Exam reveals no gallop and no friction rub.   No murmur heard. Pulmonary/Chest: Effort normal and breath sounds normal. No respiratory distress. She has no wheezes. She has no rales.  Musculoskeletal: Normal range of motion. She exhibits no edema.  Lymphadenopathy:    She has no cervical adenopathy.  Neurological: She is alert and oriented to person, place, and time. No cranial nerve deficit. She exhibits normal muscle tone. Coordination normal.  Skin: Skin is warm and dry. No rash noted. She is not diaphoretic.  Psychiatric: She has a normal mood and affect. Her behavior is normal. Judgment and thought content normal.    ED Course  Procedures (including critical care time)  04:07PM Patient is feeling completely resolved of her HA. The patient will receive the rest of her fluids. She is given the plan and agrees. All questions were answered.   MDM          Carlyle Dolly, PA-C 07/23/12 1630

## 2012-07-23 NOTE — ED Notes (Signed)
Pt. Stated. i've had a headache for 2 weeks now. I'm on Imitrx

## 2012-07-23 NOTE — ED Notes (Signed)
MD at bedside. 

## 2012-07-23 NOTE — ED Notes (Signed)
Blanket given, husband at the bedside

## 2012-07-24 NOTE — ED Provider Notes (Signed)
Medical screening examination/treatment/procedure(s) were performed by non-physician practitioner and as supervising physician I was immediately available for consultation/collaboration.   Carleene Cooper III, MD 07/24/12 [phone]

## 2012-07-26 ENCOUNTER — Ambulatory Visit (INDEPENDENT_AMBULATORY_CARE_PROVIDER_SITE_OTHER)
Admission: RE | Admit: 2012-07-26 | Discharge: 2012-07-26 | Disposition: A | Discharge status: Home / Self Care | Payer: Managed Care, Other (non HMO) | Source: Ambulatory Visit | Attending: Family | Admitting: Family

## 2012-07-26 ENCOUNTER — Other Ambulatory Visit: Payer: Self-pay | Admitting: Specialist

## 2012-07-26 DIAGNOSIS — G44309 Post-traumatic headache, unspecified, not intractable: Secondary | ICD-10-CM

## 2012-07-26 DIAGNOSIS — Z0000 Encounter for general adult medical examination without abnormal findings: Secondary | ICD-10-CM

## 2012-07-27 ENCOUNTER — Ambulatory Visit (HOSPITAL_BASED_OUTPATIENT_CLINIC_OR_DEPARTMENT_OTHER)
Admission: RE | Admit: 2012-07-27 | Discharge: 2012-07-27 | Disposition: A | Discharge status: Home / Self Care | Payer: Managed Care, Other (non HMO) | Source: Ambulatory Visit | Attending: Family | Admitting: Family

## 2012-07-27 ENCOUNTER — Encounter: Payer: Self-pay | Admitting: Family

## 2012-07-27 ENCOUNTER — Telehealth: Payer: Self-pay | Admitting: Family

## 2012-07-27 ENCOUNTER — Ambulatory Visit (INDEPENDENT_AMBULATORY_CARE_PROVIDER_SITE_OTHER): Payer: Managed Care, Other (non HMO) | Admitting: Family

## 2012-07-27 VITALS — BP 102/76 | HR 95 | Temp 98.5°F | Resp 16 | Ht 64.75 in | Wt 151.1 lb

## 2012-07-27 DIAGNOSIS — Z1231 Encounter for screening mammogram for malignant neoplasm of breast: Secondary | ICD-10-CM

## 2012-07-27 DIAGNOSIS — G43009 Migraine without aura, not intractable, without status migrainosus: Secondary | ICD-10-CM

## 2012-07-27 DIAGNOSIS — F411 Generalized anxiety disorder: Secondary | ICD-10-CM

## 2012-07-27 DIAGNOSIS — E041 Nontoxic single thyroid nodule: Secondary | ICD-10-CM | POA: Insufficient documentation

## 2012-07-27 DIAGNOSIS — I2510 Atherosclerotic heart disease of native coronary artery without angina pectoris: Secondary | ICD-10-CM

## 2012-07-27 DIAGNOSIS — E049 Nontoxic goiter, unspecified: Secondary | ICD-10-CM

## 2012-07-27 MED ORDER — SERTRALINE HCL 50 MG PO TABS: 50.0000 mg | ORAL_TABLET | Freq: Every day | ORAL | Status: DC

## 2012-07-27 NOTE — Assessment & Plan Note (Signed)
She is not on aspirin. Will add.

## 2012-07-27 NOTE — Progress Notes (Signed)
Subjective:    Patient ID: Bailey Greene, female    DOB: Oct 27, 1960, 52 y.o.   MRN: [id number]  HPI  [name] is a 52 yr old female who presents today for follow up of her anxiety.  Last visit sertraline was added to her regimen. She reports tolerating sertraline without side effects.  Has noted improvement in her anxiety symptoms. Reports that things are going well at home.  Migraine- she [facility].  Imitrex was d/c'd. She is no longer taking any otc NSAIDS.  Topamax and prn baclofen was added to her regimen. She was recently seen in the ED for her headache.  She has taken the week off from work due to HA and anxiety to "get myself together."    Review of Systems    see HPI  Past Medical History  Diagnosis Date  . HTN (hypertension)   . Anxiety   . CAD (coronary artery disease)   . Dyslipidemia   . Headache   . Migraine     History   Social History  . Marital Status: Legally Separated    Spouse Name: N/A    Number of Children: N/A  . Years of Education: N/A   Occupational History  . Not on file.   Social History Main Topics  . Smoking status: Former Games developer  . Smokeless tobacco: Never Used  . Alcohol Use: No  . Drug Use: No  . Sexually Active: Not on file   Other Topics Concern  . Not on file   Social History Narrative   Regular exercise:  3 days weekly   Caffeine Use:  1 soda daily   One child biological daughter born in 77 and an adopted niece.   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           Past Surgical History  Procedure Laterality Date  . Coronary artery bypass graft  2006     Coronary artery bypass grafting x5 with a left  internal  mammary to the left anterior descending coronary artery.  Free right  internal mammary to the diagonal coronary artery, sequential reverse  saphenous vein graft to the first and second obtuse marginal, right  artery bypass to the posterior  descending coronary artery with endo-vein harvesting.  . Cesarean section  1984  . Bunionectomy  08/2011    right foot    Family History  Problem Relation Age of Onset  . Lupus Mother   . Heart attack Father   . Diabetes Paternal Grandmother   . Hypertension Paternal Grandmother   . Stroke Neg Hx   . Kidney disease Neg Hx     No Known Allergies  Current Outpatient Prescriptions on File Prior to Visit  Medication Sig Dispense Refill  . amitriptyline (ELAVIL) 25 MG tablet Take 1 tablet (25 mg total) by mouth at bedtime.  30 tablet  2  . metoprolol succinate (TOPROL-XL) 50 MG 24 hr tablet TAKE 1 TABLET DAILY  30 tablet  0  . simvastatin (ZOCOR) 80 MG tablet TAKE 1 TABLET AT BEDTIME  30 tablet  6   No current facility-administered medications on file prior to visit.    BP 102/76  Pulse 95  Temp(Src) 98.5 F (36.9 C) (Oral)  Resp 16  Ht 5' 4.75" (1.645 m)  Wt 151 lb 1.3 oz (68.529 kg)  BMI 25.32 kg/m2  SpO2 96%  LMP 11/28/2009    Objective:   Physical  Exam  Constitutional: She appears well-developed and well-nourished. No distress.  Psychiatric: She has a normal mood and affect. Her behavior is normal. Judgment and thought content normal.          Assessment & Plan:

## 2012-07-27 NOTE — Telephone Encounter (Signed)
Left voicemail requesting pt to call office 

## 2012-07-27 NOTE — Telephone Encounter (Signed)
Pt.notified

## 2012-07-27 NOTE — Telephone Encounter (Signed)
Pls call pt and let her know that I have reviewed her chart.  I would like her to add aspirin 81mg  once daily for heart protection.

## 2012-07-27 NOTE — Assessment & Plan Note (Signed)
Improved on sertraline. Continue same.

## 2012-07-27 NOTE — Patient Instructions (Addendum)
Please schedule a follow up appointment in 3 months.

## 2012-07-27 NOTE — Assessment & Plan Note (Signed)
Now following at [facility] clinic, defer management to neuro.

## 2012-08-01 ENCOUNTER — Telehealth: Payer: Self-pay | Admitting: *Deleted

## 2012-08-01 NOTE — Telephone Encounter (Signed)
Received call from pt requesting appt with [place] due to continued headaches that are affecting her ability to perform her routine job duties. Spoke with Raffaella Edison and advised her that she should [facility] as she was referred to them for management of her headaches. Pt voices understanding and will contact them.

## 2012-08-02 ENCOUNTER — Ambulatory Visit: Payer: Managed Care, Other (non HMO) | Admitting: Family

## 2012-08-04 ENCOUNTER — Other Ambulatory Visit: Payer: Managed Care, Other (non HMO)

## 2012-08-11 ENCOUNTER — Telehealth: Payer: Self-pay | Admitting: *Deleted

## 2012-08-11 NOTE — Telephone Encounter (Signed)
Pt called stating she needs additional FMLA paperwork completed for most recent office visit as well as thyroid ultrasound date. Pt states she will have dates that need coverage and she will drop form off at office today.

## 2012-08-11 NOTE — Telephone Encounter (Signed)
Pt dropped off paperwork and it has been forwarded to Bartolo Montanye. Pt would like [place] to call her when paperwork is complete.

## 2012-08-14 ENCOUNTER — Telehealth: Payer: Self-pay | Admitting: *Deleted

## 2012-08-14 NOTE — Telephone Encounter (Signed)
Pt left message requesting status of FMLA paperwork she dropped off on Friday.  Please advise.

## 2012-08-14 NOTE — Telephone Encounter (Signed)
Paperwork is not yet ready, it should be ready in the next 1-2 days and we will contact her when complete.

## 2012-08-14 NOTE — Telephone Encounter (Signed)
Notified pt. 

## 2012-08-15 NOTE — Telephone Encounter (Signed)
Paperwork is complete.  Please advise her that there is a charge for [employer] paperwork.

## 2012-08-15 NOTE — Telephone Encounter (Signed)
Forms faxed to [employer] leave [employer] disability at [phone]. Notified pt.

## 2012-08-16 ENCOUNTER — Telehealth: Payer: Self-pay | Admitting: *Deleted

## 2012-08-16 NOTE — Telephone Encounter (Signed)
Received fax from [facility] stating insurance will now cover #12 tablets for a 25 day supply of sumatriptan and they are requesting new rx. Please advise if ok to send.

## 2012-08-17 MED ORDER — SUMATRIPTAN SUCCINATE 100 MG PO TABS: ORAL_TABLET | ORAL | Status: DC

## 2012-08-17 NOTE — Telephone Encounter (Signed)
OK to send pls.

## 2012-08-23 ENCOUNTER — Ambulatory Visit (INDEPENDENT_AMBULATORY_CARE_PROVIDER_SITE_OTHER): Payer: Managed Care, Other (non HMO) | Admitting: Family

## 2012-08-23 ENCOUNTER — Telehealth: Payer: Self-pay | Admitting: Family

## 2012-08-23 ENCOUNTER — Encounter: Payer: Self-pay | Admitting: Family

## 2012-08-23 ENCOUNTER — Other Ambulatory Visit: Payer: Self-pay | Admitting: Cardiology

## 2012-08-23 VITALS — BP 98/70 | HR 100 | Temp 98.4°F | Resp 16 | Wt 143.1 lb

## 2012-08-23 DIAGNOSIS — R634 Abnormal weight loss: Secondary | ICD-10-CM | POA: Insufficient documentation

## 2012-08-23 DIAGNOSIS — I2510 Atherosclerotic heart disease of native coronary artery without angina pectoris: Secondary | ICD-10-CM

## 2012-08-23 DIAGNOSIS — G43009 Migraine without aura, not intractable, without status migrainosus: Secondary | ICD-10-CM

## 2012-08-23 DIAGNOSIS — F411 Generalized anxiety disorder: Secondary | ICD-10-CM

## 2012-08-23 LAB — TSH: TSH: 0.534 u[IU]/mL (ref 0.350–4.500)

## 2012-08-23 MED ORDER — METOPROLOL SUCCINATE ER 50 MG PO TB24: 50.0000 mg | ORAL_TABLET | Freq: Every day | ORAL | Status: DC

## 2012-08-23 NOTE — Patient Instructions (Addendum)
Complete your lab work prior to leaving.  Please follow up in 6 weeks. Make sure that you are eating and drinking adequately throughout the day. You can try adding ensure one can twice daily if continued weight loss.

## 2012-08-23 NOTE — Assessment & Plan Note (Addendum)
Unchanged. She continues to follow with the headache clinic for trigger point injections. She has requested a continuous time off from work to try to get her headaches under control.  A letter has been provided to the pt for medical leave through 4/14.

## 2012-08-23 NOTE — Assessment & Plan Note (Signed)
Stable per patient, but will continue to monitor closely off of zoloft.

## 2012-08-23 NOTE — Assessment & Plan Note (Signed)
Add ASA 81mg  once daily for cardiac protection.  Continue toprol and statin.

## 2012-08-23 NOTE — Assessment & Plan Note (Signed)
Likely due to topamax.  Will repeat TSH.  She tells me that the MD at [facility] center is aware of her weight loss since starting topamax.  I have advised her to make sure she is getting enough calories throughout the day and if not to add ensure.

## 2012-08-23 NOTE — Telephone Encounter (Signed)
Pls call pt and let her know that I reviewed her medications and would like her to add aspirin 81mg  once daily for cardiac protections.

## 2012-08-23 NOTE — Progress Notes (Signed)
Subjective:    Patient ID: Bailey Greene, female    DOB: [birth year], 52 y.o.   MRN: [id number]  HPI  Bailey Greene is a 52 yr old female who presents today for follow up.  1) Migraines-  Reports that her migraines have been severe.  Reports that she wakes up every morning with a headache.  Last 1-2 hours.    Recently started trigger point injections.  She had her first session on Friday and will continue every 2 weeks for 4 sessions.  She reports that she felt good for first few days after trigger point injections.  On topamax and toprol. Reports that she is having trouble completing her work due to the headaches.  2) Weight loss- notes 7 pound weight loss in last 1 month. This is since being started on topamax for her headaches.    3) Anxiety- She reports that she is doing well off of the zoloft.  She felt that the zoloft was causing her to be nauseas and so she stopped.     Review of Systems See HPI  Past Medical History  Diagnosis Date  . HTN (hypertension)   . Anxiety   . CAD (coronary artery disease)   . Dyslipidemia   . Headache   . Migraine     History   Social History  . Marital Status: Legally Separated    Spouse Name: N/A    Number of Children: N/A  . Years of Education: N/A   Occupational History  . Not on file.   Social History Main Topics  . Smoking status: Former Games developer  . Smokeless tobacco: Never Used  . Alcohol Use: No  . Drug Use: No  . Sexually Active: Not on file   Other Topics Concern  . Not on file   Social History Narrative   Regular exercise:  3 days weekly   Caffeine Use:  1 soda daily   One child biological daughter born in 49 and an adopted niece.   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           Past Surgical History  Procedure Laterality Date  . Coronary artery bypass graft  2006     Coronary artery bypass grafting x5 with a left  internal  mammary to the left anterior descending coronary  artery.  Free right  internal mammary to the diagonal coronary artery, sequential reverse  saphenous vein graft to the first and second obtuse marginal, right  artery bypass to the posterior descending coronary artery with endo-vein harvesting.  . Cesarean section  1984  . Bunionectomy  08/2011    right foot    Family History  Problem Relation Age of Onset  . Lupus Mother   . Heart attack Father   . Diabetes Paternal Grandmother   . Hypertension Paternal Grandmother   . Stroke Neg Hx   . Kidney disease Neg Hx     No Known Allergies  Current Outpatient Prescriptions on File Prior to Visit  Medication Sig Dispense Refill  . metoprolol succinate (TOPROL-XL) 50 MG 24 hr tablet TAKE 1 TABLET DAILY  30 tablet  0  . simvastatin (ZOCOR) 80 MG tablet TAKE 1 TABLET AT BEDTIME  30 tablet  6  . topiramate (TOPAMAX) 25 MG tablet Take 75 mg by mouth 2 (two) times daily.       No current facility-administered medications on file prior to visit.    BP 98/70  Pulse 100  Temp(Src)  98.4 F (36.9 C) (Oral)  Resp 16  Wt 143 lb 1.9 oz (64.919 kg)  BMI 23.99 kg/m2  SpO2 99%  LMP 11/28/2009       Objective:   Physical Exam  Constitutional: She is oriented to person, place, and time. She appears well-developed and well-nourished. No distress.  HENT:  Head: Normocephalic and atraumatic.  Cardiovascular: Normal rate and regular rhythm.   No murmur heard. Pulmonary/Chest: Effort normal and breath sounds normal. No respiratory distress. She has no wheezes. She has no rales. She exhibits no tenderness.  Musculoskeletal: She exhibits no edema.  Neurological: She is alert and oriented to person, place, and time.  Psychiatric: Her behavior is normal. Judgment and thought content normal.  Tearful  Upon discussion of her severe headaches          Assessment & Plan:

## 2012-08-24 ENCOUNTER — Telehealth: Payer: Self-pay | Admitting: *Deleted

## 2012-08-24 NOTE — Telephone Encounter (Signed)
Received FMLA paperwork and forwarded to Provider.

## 2012-08-25 NOTE — Telephone Encounter (Signed)
Patient informed, understood & agreed/SLS  

## 2012-08-30 DIAGNOSIS — Z0279 Encounter for issue of other medical certificate: Secondary | ICD-10-CM

## 2012-08-31 ENCOUNTER — Encounter: Payer: Self-pay | Admitting: Family

## 2012-08-31 ENCOUNTER — Telehealth: Payer: Self-pay | Admitting: Family

## 2012-08-31 NOTE — Telephone Encounter (Signed)
I am completing her disability paperwork. I need to know the dated she was out of work pls.

## 2012-09-01 NOTE — Telephone Encounter (Signed)
Spoke to patient and informed her that forms were ready for pickup.

## 2012-09-01 NOTE — Telephone Encounter (Signed)
Spoke with pt, dates began 08/16/12 through 09/11/12.

## 2012-09-01 NOTE — Telephone Encounter (Signed)
pls call pt and let her know that her disability forms are available for pick up at the front desk.

## 2012-09-04 ENCOUNTER — Telehealth: Payer: Self-pay | Admitting: *Deleted

## 2012-09-04 NOTE — Telephone Encounter (Signed)
Received fax from Corky Sing, RN from [facility] Condition Management Program requesting (recent lab work, medications and treatment plan). Signed release granting access to PHI was not included in this fax. 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 She is aware and gives verbal consent to release information. Info printed and faxed to [phone], ph) [phone]  Ext [phone].

## 2012-10-04 ENCOUNTER — Ambulatory Visit: Payer: Managed Care, Other (non HMO) | Admitting: Family

## 2012-10-04 DIAGNOSIS — Z0289 Encounter for other administrative examinations: Secondary | ICD-10-CM

## 2012-10-05 ENCOUNTER — Ambulatory Visit: Payer: Managed Care, Other (non HMO) | Admitting: Cardiology

## 2012-10-27 ENCOUNTER — Ambulatory Visit: Payer: Managed Care, Other (non HMO) | Admitting: Family

## 2012-10-27 DIAGNOSIS — Z0289 Encounter for other administrative examinations: Secondary | ICD-10-CM

## 2012-11-30 ENCOUNTER — Telehealth: Payer: Self-pay | Admitting: *Deleted

## 2012-11-30 MED ORDER — SIMVASTATIN 80 MG PO TABS: ORAL_TABLET | ORAL | Status: DC

## 2012-11-30 MED ORDER — METOPROLOL SUCCINATE ER 50 MG PO TB24: 50.0000 mg | ORAL_TABLET | Freq: Every day | ORAL | Status: DC

## 2012-11-30 NOTE — Telephone Encounter (Signed)
Received message from pt requesting refills on metoprolol and simvastatin. Refills sent. Left detailed message on cell# re: completion and need to follow up with cardiology for further refills per Rx note in EPIC.

## 2012-12-20 ENCOUNTER — Ambulatory Visit (INDEPENDENT_AMBULATORY_CARE_PROVIDER_SITE_OTHER): Payer: Managed Care, Other (non HMO) | Admitting: Physician Assistant

## 2012-12-20 ENCOUNTER — Encounter: Payer: Self-pay | Admitting: Physician Assistant

## 2012-12-20 VITALS — BP 110/70 | HR 104 | Ht 65.0 in | Wt 148.0 lb

## 2012-12-20 DIAGNOSIS — I10 Essential (primary) hypertension: Secondary | ICD-10-CM

## 2012-12-20 DIAGNOSIS — E785 Hyperlipidemia, unspecified: Secondary | ICD-10-CM

## 2012-12-20 DIAGNOSIS — I2510 Atherosclerotic heart disease of native coronary artery without angina pectoris: Secondary | ICD-10-CM

## 2012-12-20 MED ORDER — METOPROLOL SUCCINATE ER 50 MG PO TB24: 50.0000 mg | ORAL_TABLET | Freq: Every day | ORAL | Status: DC

## 2012-12-20 MED ORDER — SIMVASTATIN 80 MG PO TABS: ORAL_TABLET | ORAL | Status: DC

## 2012-12-20 NOTE — Assessment & Plan Note (Signed)
Patient's doing well without chest pain. Unfortunately she has stopped all her medications. Had a long discussion with her about health maintenance. She says she'll restart her medications. Weight loss recommended and exercise program also recommended

## 2012-12-20 NOTE — Patient Instructions (Addendum)
PLEASE FOLLOW A LOW FAT DIET  EXERCISE MORE   NO CHANGES WERE MADE TODAY  REFILLS WERE SENT IN FOR METOPROLOL AND SIMVASTATIN  PLEASE FOLLOW UP WITH DR. HOCHREIN IN 2-3 MONTHS

## 2012-12-20 NOTE — Assessment & Plan Note (Signed)
Cholesterol panel was much worse in January. Patient was not taking her Zocor regularly and not eating properly. Recommend getting back on her Zocor on a regular basis and low-fat diet.

## 2012-12-20 NOTE — Assessment & Plan Note (Signed)
Blood pressure is stable. Recommend taking Toprol on a regular basis.

## 2012-12-20 NOTE — Progress Notes (Signed)
HPI:  This is a 52 year old African American female patient of Dr. Antoine Poche who has history of coronary artery disease status post CABG x5 in 2006 followup cath in 2007 showed patent grafts and improved LV function EF 65%. She had a normal stress test in May 2012 The patient hasn't been seen since then. She comes in today because she needs her medicines refilled. She does admit to taking them sporadically and not on a regular basis. She saw Dr. Lendell Caprice back in January because she was having a lot of trouble with migraines and she was told her cholesterol was high. The patient has not been taking her Zocor regularly, has gained weight and is not exercising. She knows she has to do better and taking care of her health. She is not smoking.  Patient denies any chest pain, palpitations, dyspnea, dyspnea on exertion, dizziness, or presyncope.  No Known Allergies  Current Outpatient Prescriptions on File Prior to Visit: aspirin EC 81 MG tablet, Take 81 mg by mouth daily., Disp: , Rfl:  topiramate (TOPAMAX) 25 MG tablet, Take 75 mg by mouth 2 (two) times daily., Disp: , Rfl:   No current facility-administered medications on file prior to visit.   Past Medical History:   HTN (hypertension)                                           Anxiety                                                      CAD (coronary artery disease)                                Dyslipidemia                                                 Headache(784.0)                                              Migraine                                                     COMMON MIGRAINE                                 01/31/2007       Comment:Qualifier: Diagnosis of  By: Cheri Guppy    Past Surgical History:   CORONARY ARTERY BYPASS GRAFT                     2006           Comment: Coronary artery bypass grafting x5 with a left  internal  mammary to the left anterior               descending coronary artery.  Free right                 internal mammary to the diagonal coronary               artery, sequential reverse  saphenous vein               graft to the first and second obtuse marginal,               right  artery bypass to the posterior               descending coronary artery with endo-vein               harvesting.   CESAREAN SECTION                                 1984         BUNIONECTOMY                                     08/2011         Comment:right foot  Review of patient's family history indicates:   Lupus                          Mother                   Heart attack                   Father                   Diabetes                       Paternal Grandmother     Hypertension                   Paternal Grandmother     Stroke                         Neg Hx                   Kidney disease                 Neg Hx                   Social History   Marital Status: Legally Separated   Spouse Name:                      Years of Education:                 Number of children:             Occupational History   None on file  Social History Main Topics   Smoking Status: Former Smoker                   Packs/Day: 0.00  Years:         Smokeless Status: Never Used  Alcohol Use: No             Drug Use: No             Sexual Activity: Not on file        Other Topics            Concern   None on file  Social History Narrative   Regular exercise:  3 days weekly   Caffeine Use:  1 soda daily   One child biological daughter born in 59 and an adopted niece.   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       ROS: See history of present illness otherwise negative   PHYSICAL EXAM: Well-nournished, in no acute distress. Neck: No JVD, HJR, Bruit, or thyroid enlargement  Lungs: No tachypnea, clear without wheezing, rales, or rhonchi  Cardiovascular: Rapid rate and rhythm 106 beats per minute, PMI not displaced, 2/6 systolic murmur at the  left sternal border, no gallops, bruit, thrill, or heave.  Abdomen: BS normal. Soft without organomegaly, masses, lesions or tenderness.  Extremities: without cyanosis, clubbing or edema. Good distal pulses bilateral  SKin: Warm, no lesions or rashes   Musculoskeletal: No deformities  Neuro: no focal signs  BP 110/70  Pulse 104  Ht 5\' 5"  (1.651 m)  Wt 148 lb (67.132 kg)  BMI 24.63 kg/m2  LMP 11/28/2009

## 2012-12-20 NOTE — Assessment & Plan Note (Signed)
>>  ASSESSMENT AND PLAN FOR ESSENTIAL HYPERTENSION WRITTEN ON 12/20/2012  3:13 PM BY LENZE, MICHELE M, PA-C  Blood pressure is stable. Recommend taking Toprol  on a regular basis.

## 2013-01-10 ENCOUNTER — Ambulatory Visit (INDEPENDENT_AMBULATORY_CARE_PROVIDER_SITE_OTHER): Payer: Managed Care, Other (non HMO) | Admitting: Family Medicine

## 2013-01-10 VITALS — BP 118/78 | HR 81 | Temp 98.2°F | Resp 16 | Ht 65.8 in | Wt 150.8 lb

## 2013-01-10 DIAGNOSIS — M545 Low back pain, unspecified: Secondary | ICD-10-CM

## 2013-01-10 MED ORDER — CYCLOBENZAPRINE HCL 10 MG PO TABS: 10.0000 mg | ORAL_TABLET | Freq: Three times a day (TID) | ORAL | Status: DC | PRN

## 2013-01-10 MED ORDER — MELOXICAM 15 MG PO TABS: 15.0000 mg | ORAL_TABLET | Freq: Every day | ORAL | Status: DC

## 2013-01-10 NOTE — Patient Instructions (Signed)
Thank you for coming in today 1. Please continue to move - walking is good. Frequent breaks at work 2. Take meloxicam once daily for 10 days then as needed. 3. Take muscle relaxer at night. 4. Use heat 3 x per day 5. Do back exercises at least once per day.

## 2013-01-10 NOTE — Progress Notes (Signed)
  Subjective:    Patient ID: Bailey Greene, female    DOB: 03-21-1961, 52 y.o.   MRN: [id number]  Back Pain Pertinent negatives include no fever.   Patient presents with back pain. Started with low back but now moving up back. Started on Sunday. No injury or trigger. H/o problems with LBP. Worsened with work today. Sits all day doing customer service. Taking aleve and motrin and tried heating pad and ice. These did not help. No radiation down back. No numbness, tingling, or weakness. Previously low back pain improves with muscle relaxer and pain medication and anti-inflammatory.   Review of Systems  Constitutional: Negative for fever, chills and unexpected weight change.  Respiratory: Negative for shortness of breath.   Genitourinary: Negative for difficulty urinating.  Musculoskeletal: Positive for back pain.       Objective:   Physical Exam  Constitutional: She appears well-developed and well-nourished.  HENT:  Head: Normocephalic and atraumatic.  Eyes: Conjunctivae and EOM are normal.  Neck: Normal range of motion.  Cardiovascular: Normal rate and regular rhythm.   Pulmonary/Chest: Effort normal.  Musculoskeletal:       Thoracic back: She exhibits decreased range of motion, tenderness, bony tenderness, pain and spasm.       Lumbar back: She exhibits decreased range of motion, tenderness, bony tenderness, pain and spasm.  Skin: Skin is warm and dry.       Assessment & Plan:  1. Muscular back pain - Mobic 15 mg x 10 days then prn - Flexeril qhs prn spasm - LBP home exercises - Heat tid - Frequent walking breaks - Consider massage therapy

## 2013-01-10 NOTE — Progress Notes (Signed)
Patient discussed with Dr. Voss. Agree with assessment and plan of care per her note.   

## 2013-02-27 ENCOUNTER — Encounter: Payer: Self-pay | Admitting: Cardiology

## 2013-02-27 ENCOUNTER — Other Ambulatory Visit: Payer: Self-pay | Admitting: Cardiology

## 2013-02-27 ENCOUNTER — Ambulatory Visit (INDEPENDENT_AMBULATORY_CARE_PROVIDER_SITE_OTHER): Payer: Managed Care, Other (non HMO) | Admitting: Cardiology

## 2013-02-27 VITALS — BP 128/86 | HR 84 | Ht 65.0 in | Wt 152.0 lb

## 2013-02-27 DIAGNOSIS — E785 Hyperlipidemia, unspecified: Secondary | ICD-10-CM

## 2013-02-27 DIAGNOSIS — I2510 Atherosclerotic heart disease of native coronary artery without angina pectoris: Secondary | ICD-10-CM

## 2013-02-27 DIAGNOSIS — Z0000 Encounter for general adult medical examination without abnormal findings: Secondary | ICD-10-CM

## 2013-02-27 MED ORDER — METOPROLOL SUCCINATE ER 50 MG PO TB24: 50.0000 mg | ORAL_TABLET | Freq: Every day | ORAL | Status: DC

## 2013-02-27 NOTE — Progress Notes (Signed)
HPI The patient presents for followup of a history of coronary artery disease status post CABG x5 in 2006 followup cath in 2007 showed patent grafts and improved LV function EF 65%. She had a normal stress test in May 2012.  She was gone from our clinic for a while and actually out of medications.  She saw our PA not long ago and she was describing some palpitations however, she had been off of her beta blocker. She says she does get some racing heartbeats occasionally and she might have some chest discomfort at that time but this is with emotional stress. She's been doing her treadmill exercises recently and does not get any of this discomfort.  She does not think that her symptoms are similar to her previous angina.  No Known Allergies  Current Outpatient Prescriptions  Medication Sig Dispense Refill  . aspirin EC 81 MG tablet Take 81 mg by mouth daily.      . metoprolol succinate (TOPROL-XL) 50 MG 24 hr tablet Take 50 mg by mouth daily. Take with or immediately following a meal.      . simvastatin (ZOCOR) 80 MG tablet TAKE 1 TABLET AT BEDTIME  90 tablet  0   No current facility-administered medications for this visit.    Past Medical History  Diagnosis Date  . HTN (hypertension)   . Anxiety   . CAD (coronary artery disease)   . Dyslipidemia   . Headache(784.0)   . Migraine   . 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    Qualifier: Diagnosis of  By: Cheri Guppy      Past Surgical History  Procedure Laterality Date  . Coronary artery bypass graft  2006     Coronary artery bypass grafting x5 with a left  internal  mammary to the left anterior descending coronary artery.  Free right  internal mammary to the diagonal coronary artery, sequential reverse  saphenous vein graft to the first and second obtuse marginal, right  artery bypass to the posterior descending coronary artery with endo-vein harvesting.  . Cesarean section  1984  . Bunionectomy  08/2011    right foot    ROS:  As stated in  the HPI and negative for all other systems.  PHYSICAL EXAM BP 128/86  Pulse 84  Ht 5\' 5"  (1.651 m)  Wt 152 lb (68.947 kg)  BMI 25.29 kg/m2  LMP 11/28/2009 GENERAL:  Well appearing HEENT:  Pupils equal round and reactive, fundi not visualized, oral mucosa unremarkable NECK:  No jugular venous distention, waveform within normal limits, carotid upstroke brisk and symmetric, no bruits, no thyromegaly LYMPHATICS:  No cervical, inguinal adenopathy LUNGS:  Clear to auscultation bilaterally BACK:  No CVA tenderness CHEST:  Unremarkable HEART:  PMI not displaced or sustained,S1 and S2 within normal limits, no S3, no S4, no clicks, no rubs, no murmurs ABD:  Flat, positive bowel sounds normal in frequency in pitch, no bruits, no rebound, no guarding, no midline pulsatile mass, no hepatomegaly, no splenomegaly EXT:  2 plus pulses throughout, no edema, no cyanosis no clubbing SKIN:  No rashes no nodules NEURO:  Cranial nerves II through XII grossly intact, motor grossly intact throughout PSYCH:  Cognitively intact, oriented to person place and time  EKG:  Sinus rhythm, rate 86, axis within normal limits, intervals within normal limits, no acute ST-T wave changes.  ASSESSMENT AND PLAN  CAD:  Her symptoms are somewhat atypical.  She's had no new symptoms since her stress test in 2012. No change  in therapy is indicated.  DYSLIPIDEMIA:  The patient will come back for fasting lipid profile now that she is back on her simvastatin.  HTN:  Her blood pressure is well controlled. She will continue on meds as listed.

## 2013-02-27 NOTE — Patient Instructions (Addendum)
The current medical regimen is effective;  continue present plan and medications.  Please return fasting for Lipid profile.  Follow up in 1 year with Dr Antoine Poche.  You will receive a letter in the mail 2 months before you are due.  Please call us when you receive this letter to schedule your follow up appointment.

## 2013-03-19 ENCOUNTER — Other Ambulatory Visit: Payer: Managed Care, Other (non HMO)

## 2013-04-17 ENCOUNTER — Other Ambulatory Visit: Payer: Self-pay

## 2013-04-17 DIAGNOSIS — I2510 Atherosclerotic heart disease of native coronary artery without angina pectoris: Secondary | ICD-10-CM

## 2013-04-17 MED ORDER — SIMVASTATIN 80 MG PO TABS: ORAL_TABLET | ORAL | Status: DC

## 2013-04-30 ENCOUNTER — Ambulatory Visit (INDEPENDENT_AMBULATORY_CARE_PROVIDER_SITE_OTHER): Payer: Managed Care, Other (non HMO) | Admitting: Family

## 2013-04-30 ENCOUNTER — Encounter: Payer: Self-pay | Admitting: Family

## 2013-04-30 VITALS — BP 106/78 | HR 94 | Temp 98.4°F | Resp 16 | Ht 64.75 in | Wt 153.0 lb

## 2013-04-30 DIAGNOSIS — M79609 Pain in unspecified limb: Secondary | ICD-10-CM

## 2013-04-30 DIAGNOSIS — M79641 Pain in right hand: Secondary | ICD-10-CM

## 2013-04-30 NOTE — Progress Notes (Signed)
Subjective:    Patient ID: Bailey Greene, female    DOB: 02/14/1961, 52 y.o.   MRN: [id number]  HPI  Bailey Greene is a 52 yr old female who presents today with chief complaint of bilateral "cysts" on her hands.  Reports present x several years but recently worsening. Reports associated  Pain, hand numbness. Types a lot at work.   Review of Systems   See HPI  Past Medical History  Diagnosis Date  . HTN (hypertension)   . Anxiety   . CAD (coronary artery disease)   . Dyslipidemia   . Headache(784.0)   . Migraine   . 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    Qualifier: Diagnosis of  By: Cheri Guppy      History   Social History  . Marital Status: Legally Separated    Spouse Name: N/A    Number of Children: N/A  . Years of Education: N/A   Occupational History  . Not on file.   Social History Main Topics  . Smoking status: Former Games developer  . Smokeless tobacco: Never Used  . Alcohol Use: No  . Drug Use: No  . Sexual Activity: Yes   Other Topics Concern  . Not on file   Social History Narrative   Regular exercise:  3 days weekly   Caffeine Use:  1 soda daily   One child biological daughter born in 38 and an adopted niece.   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           Past Surgical History  Procedure Laterality Date  . Coronary artery bypass graft  2006     Coronary artery bypass grafting x5 with a left  internal  mammary to the left anterior descending coronary artery.  Free right  internal mammary to the diagonal coronary artery, sequential reverse  saphenous vein graft to the first and second obtuse marginal, right  artery bypass to the posterior descending coronary artery with endo-vein harvesting.  . Cesarean section  1984  . Bunionectomy  08/2011    right foot    Family History  Problem Relation Age of Onset  . Lupus Mother   . Heart attack Father   . Diabetes Paternal Grandmother   . Hypertension Paternal  Grandmother   . Stroke Neg Hx   . Kidney disease Neg Hx     No Known Allergies  Current Outpatient Prescriptions on File Prior to Visit  Medication Sig Dispense Refill  . aspirin EC 81 MG tablet Take 81 mg by mouth daily.      . metoprolol succinate (TOPROL-XL) 50 MG 24 hr tablet Take 1 tablet (50 mg total) by mouth daily. Take with or immediately following a meal.  30 tablet  11  . simvastatin (ZOCOR) 80 MG tablet TAKE 1 TABLET AT BEDTIME  90 tablet  4   No current facility-administered medications on file prior to visit.    BP 106/78  Pulse 94  Temp(Src) 98.4 F (36.9 C) (Oral)  Resp 16  Ht 5' 4.75" (1.645 m)  Wt 153 lb (69.4 kg)  BMI 25.65 kg/m2  SpO2 97%  LMP 11/28/2009       Objective:   Physical Exam  Constitutional: She is oriented to person, place, and time. She appears well-developed and well-nourished. No distress.  HENT:  Head: Normocephalic and atraumatic.  Cardiovascular: Normal rate and regular rhythm.   No murmur heard. Pulmonary/Chest: Effort normal and breath sounds normal. No respiratory distress. She  has no wheezes. She has no rales. She exhibits no tenderness.  Musculoskeletal: She exhibits no edema.  Decreased flexion bilateral wrists + mild swelling at carpometacapal joint bilaterally R>L  Neurological: She is alert and oriented to person, place, and time.  Psychiatric: She has a normal mood and affect. Her behavior is normal. Judgment and thought content normal.          Assessment & Plan:  Discussed with pt re: to schedule a follow up appointment some time in the next few months for routine follow up.She declines flu shot.

## 2013-04-30 NOTE — Patient Instructions (Signed)
You will be contacted about your referral to Dr. Pearletha Forge.  Continue to wear your wrist brace.

## 2013-05-02 ENCOUNTER — Ambulatory Visit (INDEPENDENT_AMBULATORY_CARE_PROVIDER_SITE_OTHER): Payer: Managed Care, Other (non HMO) | Admitting: Family Medicine

## 2013-05-02 ENCOUNTER — Encounter: Payer: Self-pay | Admitting: Family Medicine

## 2013-05-02 VITALS — BP 133/91 | HR 97 | Ht 64.0 in | Wt 153.0 lb

## 2013-05-02 DIAGNOSIS — G5600 Carpal tunnel syndrome, unspecified upper limb: Secondary | ICD-10-CM

## 2013-05-02 MED ORDER — PREDNISONE (PAK) 10 MG PO TABS: ORAL_TABLET | ORAL | Status: DC

## 2013-05-02 NOTE — Patient Instructions (Signed)
You have carpal tunnel syndrome. Wear the wrist splint at nighttime and as often as possible during the day Take prednisone dose pack as directed for 6 days. After this can start taking aleve 2 tabs twice a day with food OR ibuprofen 600mg  three times a day with food for pain and inflammation. Corticosteroid injection is a consideration to help with pain and inflammation if not improving. Consider nerve conduction studies if symptoms persist beyond 6 weeks. Surgery tends to work well for this if you do not improve with conservative care. Follow up with me in 5-6 weeks.

## 2013-05-03 ENCOUNTER — Encounter: Payer: Self-pay | Admitting: Family Medicine

## 2013-05-03 DIAGNOSIS — G5600 Carpal tunnel syndrome, unspecified upper limb: Secondary | ICD-10-CM | POA: Insufficient documentation

## 2013-05-03 NOTE — Progress Notes (Signed)
Patient ID: Bailey Greene, female   DOB: 03/13/1961, 52 y.o.   MRN: [id number]  PCP: Lemont Fillers., NP  Subjective:   HPI: Patient is a 52 y.o. female here for right hand pain.  Patient reports over the past 2 weeks she has had pain volar aspect of right hand/wrist. Associated with tingling into middle 3 digits though starting to lose feeling in thumb too. Started getting same symptoms on left side also. Just started wearing a brace on right - didn't tolerate cockup wrist splint she was provided though. Worse at night. Taking otc aleve.  Past Medical History  Diagnosis Date  . HTN (hypertension)   . Anxiety   . CAD (coronary artery disease)   . Dyslipidemia   . Headache(784.0)   . Migraine   . 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    Qualifier: Diagnosis of  By: Cheri Guppy      Current Outpatient Prescriptions on File Prior to Visit  Medication Sig Dispense Refill  . aspirin EC 81 MG tablet Take 81 mg by mouth daily.      . metoprolol succinate (TOPROL-XL) 50 MG 24 hr tablet Take 1 tablet (50 mg total) by mouth daily. Take with or immediately following a meal.  30 tablet  11  . simvastatin (ZOCOR) 80 MG tablet TAKE 1 TABLET AT BEDTIME  90 tablet  4   No current facility-administered medications on file prior to visit.    Past Surgical History  Procedure Laterality Date  . Coronary artery bypass graft  2006     Coronary artery bypass grafting x5 with a left  internal  mammary to the left anterior descending coronary artery.  Free right  internal mammary to the diagonal coronary artery, sequential reverse  saphenous vein graft to the first and second obtuse marginal, right  artery bypass to the posterior descending coronary artery with endo-vein harvesting.  . Cesarean section  1984  . Bunionectomy  08/2011    right foot    No Known Allergies  History   Social History  . Marital Status: Legally Separated    Spouse Name: N/A    Number of Children: N/A  .  Years of Education: N/A   Occupational History  . Not on file.   Social History Main Topics  . Smoking status: Former Games developer  . Smokeless tobacco: Never Used  . Alcohol Use: No  . Drug Use: No  . Sexual Activity: Yes   Other Topics Concern  . Not on file   Social History Narrative   Regular exercise:  3 days weekly   Caffeine Use:  1 soda daily   One child biological daughter born in 7 and an adopted niece.   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           Family History  Problem Relation Age of Onset  . Lupus Mother   . Heart attack Father   . Diabetes Father   . Hypertension Father   . Diabetes Paternal Grandmother   . Hypertension Paternal Grandmother   . Stroke Neg Hx   . Kidney disease Neg Hx   . Hyperlipidemia Neg Hx   . Sudden death Neg Hx     BP 133/91  Pulse 97  Ht 5\' 4"  (1.626 m)  Wt 153 lb (69.4 kg)  BMI 26.25 kg/m2  LMP 11/28/2009  Review of Systems: See HPI above.    Objective:  Physical Exam:  Gen: NAD  Bilateral hands/wrists: No gross  deformity, swelling, bruising, atrophy. TTP volar wrists.  No other TTP wrists, hands. FROM wrists, digits.  4/5 strength finger abduction.  5/5 finger extension and thumb opposition. + tinels bilterally.  Negative phalens. Sensation diminished in palms and to 2nd-4th digits.    Assessment & Plan:  1. Bilateral carpal tunnel syndrome - wrist braces at bedtime and as often as possible during the day.  Prednisone dose pack then transition to nsaids.  Consider injections, nerve conduction studies if not improving.

## 2013-05-03 NOTE — Assessment & Plan Note (Signed)
wrist braces at bedtime and as often as possible during the day. Prednisone dose pack then transition to nsaids. Consider injections, nerve conduction studies if not improving.

## 2013-05-07 ENCOUNTER — Encounter: Payer: Managed Care, Other (non HMO) | Admitting: Family

## 2013-05-17 ENCOUNTER — Telehealth: Payer: Self-pay | Admitting: Family Medicine

## 2013-05-17 NOTE — Telephone Encounter (Signed)
Would recommend continuing with the braces and ensure she finished the prednisone.  We could try injections next week if she wanted to.  I've been filling out her FMLA paperwork also but am confused - is it for the two days she missed that are listed on the first page (Dec 2 and 3)?  I want to make sure I'm completing this properly.

## 2013-05-28 ENCOUNTER — Encounter: Payer: Self-pay | Admitting: Family Medicine

## 2013-05-28 ENCOUNTER — Ambulatory Visit (INDEPENDENT_AMBULATORY_CARE_PROVIDER_SITE_OTHER): Payer: Managed Care, Other (non HMO) | Admitting: Family Medicine

## 2013-05-28 VITALS — BP 129/82 | HR 97 | Ht 65.0 in | Wt 151.0 lb

## 2013-05-28 DIAGNOSIS — G5600 Carpal tunnel syndrome, unspecified upper limb: Secondary | ICD-10-CM

## 2013-05-28 NOTE — Patient Instructions (Signed)
You have carpal tunnel syndrome. Continue the wrist braces as you have been. Consider either the cortisone injections or the nerve conduction studies - call me with which one you would like to do.

## 2013-05-30 ENCOUNTER — Encounter: Payer: Self-pay | Admitting: Family Medicine

## 2013-05-30 NOTE — Progress Notes (Signed)
Patient ID: Bailey Greene, female   DOB: [birth year], 52 y.o.   MRN: [id number]  PCP: Lemont Fillers., NP  Subjective:   HPI: Patient is a 52 y.o. female here for right hand pain.  12/3: Patient reports over the past 2 weeks she has had pain volar aspect of right hand/wrist. Associated with tingling into middle 3 digits though starting to lose feeling in thumb too. Started getting same symptoms on left side also. Just started wearing a brace on right - didn't tolerate cockup wrist splint she was provided though. Worse at night. Taking otc aleve.  12/29: Patient reports she feels about the same. Tingling into middle 3rd digits, thumb some also. Wearing braces on both sides. Taking aleve.  Past Medical History  Diagnosis Date  . HTN (hypertension)   . Anxiety   . CAD (coronary artery disease)   . Dyslipidemia   . Headache(784.0)   . Migraine   . 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    Qualifier: Diagnosis of  By: Cheri Guppy      Current Outpatient Prescriptions on File Prior to Visit  Medication Sig Dispense Refill  . aspirin EC 81 MG tablet Take 81 mg by mouth daily.      . metoprolol succinate (TOPROL-XL) 50 MG 24 hr tablet Take 1 tablet (50 mg total) by mouth daily. Take with or immediately following a meal.  30 tablet  11  . predniSONE (STERAPRED UNI-PAK) 10 MG tablet 6 tabs po day 1, 5 tabs po day 2, 4 tabs po day 3, 3 tabs po day 4, 2 tabs po day 5, 1 tab po day 6  21 tablet  0  . simvastatin (ZOCOR) 80 MG tablet TAKE 1 TABLET AT BEDTIME  90 tablet  4   No current facility-administered medications on file prior to visit.    Past Surgical History  Procedure Laterality Date  . Coronary artery bypass graft  2006     Coronary artery bypass grafting x5 with a left  internal  mammary to the left anterior descending coronary artery.  Free right  internal mammary to the diagonal coronary artery, sequential reverse  saphenous vein graft to the first and second  obtuse marginal, right  artery bypass to the posterior descending coronary artery with endo-vein harvesting.  . Cesarean section  1984  . Bunionectomy  08/2011    right foot    No Known Allergies  History   Social History  . Marital Status: Legally Separated    Spouse Name: N/A    Number of Children: N/A  . Years of Education: N/A   Occupational History  . Not on file.   Social History Main Topics  . Smoking status: Former Games developer  . Smokeless tobacco: Never Used  . Alcohol Use: No  . Drug Use: No  . Sexual Activity: Yes   Other Topics Concern  . Not on file   Social History Narrative   Regular exercise:  3 days weekly   Caffeine Use:  1 soda daily   One child biological daughter born in 19 and an adopted niece.   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           Family History  Problem Relation Age of Onset  . Lupus Mother   . Heart attack Father   . Diabetes Father   . Hypertension Father   . Diabetes Paternal Grandmother   . Hypertension Paternal Grandmother   . Stroke Neg Hx   .  Kidney disease Neg Hx   . Hyperlipidemia Neg Hx   . Sudden death Neg Hx     BP 129/82  Pulse 97  Ht 5\' 5"  (1.651 m)  Wt 151 lb (68.493 kg)  BMI 25.13 kg/m2  LMP 11/28/2009  Review of Systems: See HPI above.    Objective:  Physical Exam:  Gen: NAD  Bilateral hands/wrists: No gross deformity, swelling, bruising, atrophy. TTP volar wrists.  No other TTP wrists, hands. FROM wrists, digits.  4/5 strength finger abduction.  5/5 finger extension and thumb opposition. + tinels bilterally.  Negative phalens. Sensation diminished in palms and to 2nd-4th digits.    Assessment & Plan:  1. Bilateral carpal tunnel syndrome - Discussed moving ahead with injections or nerve conduction studies.  She would like to consider these options before moving forward - will call us regarding what she decides.  Continue wrist braces, aleve in meantime.

## 2013-05-30 NOTE — Assessment & Plan Note (Signed)
Bilateral carpal tunnel syndrome - Discussed moving ahead with injections or nerve conduction studies.  She would like to consider these options before moving forward - will call us regarding what she decides.  Continue wrist braces, aleve in meantime.

## 2013-06-11 ENCOUNTER — Ambulatory Visit: Payer: Managed Care, Other (non HMO) | Admitting: Family Medicine

## 2013-07-11 ENCOUNTER — Encounter: Payer: Self-pay | Admitting: Family Medicine

## 2013-07-11 ENCOUNTER — Ambulatory Visit (INDEPENDENT_AMBULATORY_CARE_PROVIDER_SITE_OTHER): Payer: Managed Care, Other (non HMO) | Admitting: Family Medicine

## 2013-07-11 ENCOUNTER — Ambulatory Visit: Payer: Managed Care, Other (non HMO) | Admitting: Family Medicine

## 2013-07-11 VITALS — BP 114/75 | HR 83 | Ht 65.0 in | Wt 152.0 lb

## 2013-07-11 DIAGNOSIS — G5600 Carpal tunnel syndrome, unspecified upper limb: Secondary | ICD-10-CM

## 2013-07-17 ENCOUNTER — Encounter: Payer: Self-pay | Admitting: Family Medicine

## 2013-07-17 NOTE — Assessment & Plan Note (Signed)
Bilateral injections given today into carpal tunnels.  Continue with bracing, nsaids.  Consider NCVs/EMGs if does not improve with injections.  After informed written consent patient was seated in chair in exam room.  Ultrasound used to identify carpal tunnel between left median nerve and ulnar artery.  After alcohol prep, left carpal tunnel was injected with 2:1 marcaine: depomedrol.  Patient tolerated procedure well without immediate complications.  After informed written consent patient was seated in chair in exam room.  Ultrasound used to identify carpal tunnel between right median nerve and ulnar artery.  After alcohol prep, right carpal tunnel was injected with 2:1 marcaine: depomedrol.  Patient tolerated procedure well without immediate complications.

## 2013-07-17 NOTE — Progress Notes (Signed)
Patient ID: Bailey Greene, female   DOB: 1960/09/26, 53 y.o.   MRN: [id number]  PCP: Lemont Fillers., NP  Subjective:   HPI: Patient is a 53 y.o. female here for right hand pain.  12/3: Patient reports over the past 2 weeks she has had pain volar aspect of right hand/wrist. Associated with tingling into middle 3 digits though starting to lose feeling in thumb too. Started getting same symptoms on left side also. Just started wearing a brace on right - didn't tolerate cockup wrist splint she was provided though. Worse at night. Taking otc aleve.  12/29: Patient reports she feels about the same. Tingling into middle 3rd digits, thumb some also. Wearing braces on both sides. Taking aleve.  07/11/13: Patient returns requesting injections for bilateral carpal tunnel. Despite bracing she continues to have tingling, pain in hands into thumb through middle digits. No other complaints.  Past Medical History  Diagnosis Date  . HTN (hypertension)   . Anxiety   . CAD (coronary artery disease)   . Dyslipidemia   . Headache(784.0)   . Migraine   . 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    Qualifier: Diagnosis of  By: Cheri Guppy      Current Outpatient Prescriptions on File Prior to Visit  Medication Sig Dispense Refill  . aspirin EC 81 MG tablet Take 81 mg by mouth daily.      . metoprolol succinate (TOPROL-XL) 50 MG 24 hr tablet Take 1 tablet (50 mg total) by mouth daily. Take with or immediately following a meal.  30 tablet  11  . predniSONE (STERAPRED UNI-PAK) 10 MG tablet 6 tabs po day 1, 5 tabs po day 2, 4 tabs po day 3, 3 tabs po day 4, 2 tabs po day 5, 1 tab po day 6  21 tablet  0  . simvastatin (ZOCOR) 80 MG tablet TAKE 1 TABLET AT BEDTIME  90 tablet  4   No current facility-administered medications on file prior to visit.    Past Surgical History  Procedure Laterality Date  . Coronary artery bypass graft  2006     Coronary artery bypass grafting x5 with a left   internal  mammary to the left anterior descending coronary artery.  Free right  internal mammary to the diagonal coronary artery, sequential reverse  saphenous vein graft to the first and second obtuse marginal, right  artery bypass to the posterior descending coronary artery with endo-vein harvesting.  . Cesarean section  1984  . Bunionectomy  08/2011    right foot    No Known Allergies  History   Social History  . Marital Status: Legally Separated    Spouse Name: N/A    Number of Children: N/A  . Years of Education: N/A   Occupational History  . Not on file.   Social History Main Topics  . Smoking status: Former Games developer  . Smokeless tobacco: Never Used  . Alcohol Use: No  . Drug Use: No  . Sexual Activity: Yes   Other Topics Concern  . Not on file   Social History Narrative   Regular exercise:  3 days weekly   Caffeine Use:  1 soda daily   One child biological daughter born in 27 and an adopted niece.   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           Family History  Problem Relation Age of Onset  . Lupus Mother   . Heart attack Father   .  Diabetes Father   . Hypertension Father   . Diabetes Paternal Grandmother   . Hypertension Paternal Grandmother   . Stroke Neg Hx   . Kidney disease Neg Hx   . Hyperlipidemia Neg Hx   . Sudden death Neg Hx     BP 114/75  Pulse 83  Ht 5\' 5"  (1.651 m)  Wt 152 lb (68.947 kg)  BMI 25.29 kg/m2  LMP 11/28/2009  Review of Systems: See HPI above.    Objective:  Physical Exam:  Gen: NAD  Bilateral hands/wrists: No gross deformity, swelling, bruising, atrophy. TTP volar wrists.  No other TTP wrists, hands. FROM wrists, digits.  4/5 strength finger abduction.  5/5 finger extension and thumb opposition. + tinels bilterally.  Negative phalens. Sensation diminished in palms and to 1st-4th digits.    Assessment & Plan:  1. Bilateral carpal tunnel syndrome - Bilateral injections given today  into carpal tunnels.  Continue with bracing, nsaids.  Consider NCVs/EMGs if does not improve with injections.  After informed written consent patient was seated in chair in exam room.  Ultrasound used to identify carpal tunnel between left median nerve and ulnar artery.  After alcohol prep, left carpal tunnel was injected with 2:1 marcaine: depomedrol.  Patient tolerated procedure well without immediate complications.  After informed written consent patient was seated in chair in exam room.  Ultrasound used to identify carpal tunnel between right median nerve and ulnar artery.  After alcohol prep, right carpal tunnel was injected with 2:1 marcaine: depomedrol.  Patient tolerated procedure well without immediate complications.

## 2013-07-19 ENCOUNTER — Encounter: Payer: Self-pay | Admitting: Family Medicine

## 2013-07-19 ENCOUNTER — Ambulatory Visit (INDEPENDENT_AMBULATORY_CARE_PROVIDER_SITE_OTHER): Payer: Managed Care, Other (non HMO) | Admitting: Family Medicine

## 2013-07-19 VITALS — BP 121/83 | HR 85 | Ht 65.0 in | Wt 152.0 lb

## 2013-07-19 DIAGNOSIS — G5600 Carpal tunnel syndrome, unspecified upper limb: Secondary | ICD-10-CM

## 2013-07-19 NOTE — Progress Notes (Addendum)
Patient ID: Bailey Greene, female   DOB: 07/15/1960, 53 y.o.   MRN: [id number]  PCP: Bailey Fillers'SULLIVAN,MELISSA S., NP  Subjective:   HPI: Patient is a 53 y.o. female here for right hand pain.  12/3: Patient reports over the past 2 weeks she has had pain volar aspect of right hand/wrist. Associated with tingling into middle 3 digits though starting to lose feeling in thumb too. Started getting same symptoms on left side also. Just started wearing a brace on right - didn't tolerate cockup wrist splint she was provided though. Worse at night. Taking otc aleve.  12/29: Patient reports she feels about the same. Tingling into middle 3rd digits, thumb some also. Wearing braces on both sides. Taking aleve.  07/11/13: Patient returns requesting injections for bilateral carpal tunnel. Despite bracing she continues to have tingling, pain in hands into thumb through middle digits. No other complaints.  07/19/13: Patient reports unfortunately injections have not helped with her wrists. Continues with tingling in middle 3rd digits, thumb. Worse at work with typing. Some swelling still in wrists.  Past Medical History  Diagnosis Date  . HTN (hypertension)   . Anxiety   . CAD (coronary artery disease)   . Dyslipidemia   . Headache(784.0)   . Migraine   . 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    Qualifier: Diagnosis of  By: Cheri GuppyAlleyne, Tiffany      Current Outpatient Prescriptions on File Prior to Visit  Medication Sig Dispense Refill  . aspirin EC 81 MG tablet Take 81 mg by mouth daily.      . metoprolol succinate (TOPROL-XL) 50 MG 24 hr tablet Take 1 tablet (50 mg total) by mouth daily. Take with or immediately following a meal.  30 tablet  11  . predniSONE (STERAPRED UNI-PAK) 10 MG tablet 6 tabs po day 1, 5 tabs po day 2, 4 tabs po day 3, 3 tabs po day 4, 2 tabs po day 5, 1 tab po day 6  21 tablet  0  . simvastatin (ZOCOR) 80 MG tablet TAKE 1 TABLET AT BEDTIME  90 tablet  4   No current  facility-administered medications on file prior to visit.    Past Surgical History  Procedure Laterality Date  . Coronary artery bypass graft  2006     Coronary artery bypass grafting x5 with a left  internal  mammary to the left anterior descending coronary artery.  Free right  internal mammary to the diagonal coronary artery, sequential reverse  saphenous vein graft to the first and second obtuse marginal, right  artery bypass to the posterior descending coronary artery with endo-vein harvesting.  . Cesarean section  1984  . Bunionectomy  08/2011    right foot    No Known Allergies  History   Social History  . Marital Status: Legally Separated    Spouse Name: N/A    Number of Children: N/A  . Years of Education: N/A   Occupational History  . Not on file.   Social History Main Topics  . Smoking status: Former Games developermoker  . Smokeless tobacco: Never Used  . Alcohol Use: No  . Drug Use: No  . Sexual Activity: Yes   Other Topics Concern  . Not on file   Social History Narrative   Regular exercise:  3 days weekly   Caffeine Use:  1 soda daily   One child biological daughter born in [id number] and an adopted niece.   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  Family History  Problem Relation Age of Onset  . Lupus Mother   . Heart attack Father   . Diabetes Father   . Hypertension Father   . Diabetes Paternal Grandmother   . Hypertension Paternal Grandmother   . Stroke Neg Hx   . Kidney disease Neg Hx   . Hyperlipidemia Neg Hx   . Sudden death Neg Hx     BP 121/83  Pulse 85  Ht 5\' 5"  (1.651 m)  Wt 152 lb (68.947 kg)  BMI 25.29 kg/m2  LMP 11/28/2009  Review of Systems: See HPI above.    Objective:  Physical Exam:  Gen: NAD  Bilateral hands/wrists: No gross deformity, swelling, bruising, atrophy. TTP volar wrists.  No other TTP wrists, hands. FROM wrists, digits.  4/5 strength finger abduction.  5/5 finger extension and thumb  opposition. + tinels bilterally.  Negative phalens. Sensation diminished in palms and to 1st-4th digits.    Assessment & Plan:  1. Bilateral carpal tunnel syndrome - Injections and wrist braces not helping her to this point.  Will move forward with NCVs/EMGs to confirm diagnosis, rule out other nerve impingement conditions. Continue bracing in meantime.  Out of work until we have the results - likely will need surgical referral if diagnosis confirmed.  Addendum:  NCV/EMGs reviewed - confirm moderate right and mild left carpal tunnel.  Given she has not improved with conservative treatment will go ahead with hand surgery referral to discuss releases.

## 2013-07-19 NOTE — Patient Instructions (Signed)
We will go ahead with nerve conduction studies at this point to assess the severity of carpal tunnel and ensure it's not a different nerve impingement.  I will contact you the business day following these to go over results. Out of work in meantime.

## 2013-07-19 NOTE — Assessment & Plan Note (Signed)
Injections and wrist braces not helping her to this point.  Will move forward with NCVs/EMGs to confirm diagnosis, rule out other nerve impingement conditions. Continue bracing in meantime.  Out of work until we have the results - likely will need surgical referral if diagnosis confirmed.

## 2013-07-20 NOTE — Addendum Note (Signed)
Addended by: Lenda KelpHUDNALL, SHANE R on: 07/20/2013 09:20 AM   Modules accepted: Orders

## 2013-08-14 ENCOUNTER — Encounter (INDEPENDENT_AMBULATORY_CARE_PROVIDER_SITE_OTHER): Payer: Self-pay | Admitting: Radiology

## 2013-08-14 ENCOUNTER — Ambulatory Visit (INDEPENDENT_AMBULATORY_CARE_PROVIDER_SITE_OTHER): Payer: Managed Care, Other (non HMO) | Admitting: Diagnostic Neuroimaging

## 2013-08-14 DIAGNOSIS — R200 Anesthesia of skin: Secondary | ICD-10-CM

## 2013-08-14 DIAGNOSIS — G5600 Carpal tunnel syndrome, unspecified upper limb: Secondary | ICD-10-CM

## 2013-08-14 DIAGNOSIS — R209 Unspecified disturbances of skin sensation: Secondary | ICD-10-CM

## 2013-08-14 DIAGNOSIS — Z0289 Encounter for other administrative examinations: Secondary | ICD-10-CM

## 2013-08-14 NOTE — Procedures (Signed)
   [facility]  NCS (NERVE CONDUCTION STUDY) WITH EMG (ELECTROMYOGRAPHY) REPORT   STUDY DATE: 08/14/13 PATIENT NAME: Bailey Greene DOB: 05/07/1961 MRN: [id number]  ORDERING CLINICIAN: Norton BlizzardShane Hudnall, MD  TECHNOLOGIST: Kaylyn LimSue Fox ELECTROMYOGRAPHER: Glenford BayleyVikram R. Caryl Fate, MD  CLINICAL INFORMATION: 53 year old female with right greater than left hand numbness/pain.  FINDINGS: NERVE CONDUCTION STUDY: Bilateral median motor responses have prolonged distal latencies (right 5.4 ms, left 4.8 ms, normal less than or equal to 4.2 ms), normal amplitudes, normal conduction velocities and normal F-wave latencies. Bilateral ulnar motor responses and F-wave latencies are normal.  Right median sensory response is normal amplitude and slow conduction velocity (47 m/s). Left median and bilateral ulnar sensory responses are normal.  Right median transcarpal mixed nerve responses normal amplitude and slow conduction velocity (40 m/s). Left median and bilateral ulnar transcarpal mixed nerve responses are normal.  NEEDLE ELECTROMYOGRAPHY: Needle examination of right upper extremity (deltoid, biceps, triceps, flexor carpi radialis, first dorsal interosseous) shows no abnormal spontaneous activity at rest and normal motor unit recruitment on exertion.  IMPRESSION:  Abnormal study demonstrating moderate right and mild left median neuropathies at the wrists consistent with moderate right and mild left carpal tunnel syndromes.   INTERPRETING PHYSICIAN:  Suanne MarkerVIKRAM R. Arlesia Kiel, MD Certified in Neurology, Neurophysiology and Neuroimaging  [facility] [address], [address] [place], [place] [id number] [phone]

## 2013-08-15 NOTE — Addendum Note (Signed)
Addended by: Lenda KelpHUDNALL, SHANE R on: 08/15/2013 03:42 PM   Modules accepted: Orders

## 2013-08-16 ENCOUNTER — Telehealth: Payer: Self-pay | Admitting: Family Medicine

## 2013-08-16 DIAGNOSIS — G5600 Carpal tunnel syndrome, unspecified upper limb: Secondary | ICD-10-CM

## 2013-08-16 MED ORDER — HYDROCODONE-ACETAMINOPHEN 5-325 MG PO TABS: 1.0000 | ORAL_TABLET | Freq: Four times a day (QID) | ORAL | Status: DC | PRN

## 2013-08-16 NOTE — Telephone Encounter (Signed)
Patient would like to try the Norco 5/325.

## 2013-08-16 NOTE — Telephone Encounter (Signed)
Please let me know when her hand surgeon referral appointment is.  We can give her something stronger for until she gets in with them.  Thanks!

## 2013-08-16 NOTE — Telephone Encounter (Signed)
Notified by MA patient's appointment is 3/24.  If patient would like she can have Norco 5/325, 1 tab every 6 hours as needed for severe pain, 20 tablets with 0 refills.  Will print this out if she wants.

## 2013-08-20 ENCOUNTER — Encounter: Payer: Managed Care, Other (non HMO) | Admitting: Diagnostic Neuroimaging

## 2014-01-28 ENCOUNTER — Telehealth: Payer: Self-pay | Admitting: Family

## 2014-01-28 DIAGNOSIS — E041 Nontoxic single thyroid nodule: Secondary | ICD-10-CM

## 2014-01-28 NOTE — Telephone Encounter (Signed)
Please contact pt and let her know that I reviewed her record and see that she is due for a follow up thyroid ultrasound to monitor the thyroid cyst/nodule which was noted on previous thyroid US.  I have pended below.

## 2014-01-28 NOTE — Telephone Encounter (Signed)
Left detailed message on voicemail and to call if any questions. 

## 2014-01-30 ENCOUNTER — Ambulatory Visit (HOSPITAL_BASED_OUTPATIENT_CLINIC_OR_DEPARTMENT_OTHER): Payer: Managed Care, Other (non HMO)

## 2014-03-26 ENCOUNTER — Other Ambulatory Visit: Payer: Self-pay | Admitting: Cardiology

## 2014-04-03 ENCOUNTER — Ambulatory Visit (INDEPENDENT_AMBULATORY_CARE_PROVIDER_SITE_OTHER): Payer: Managed Care, Other (non HMO) | Admitting: Cardiology

## 2014-04-03 ENCOUNTER — Encounter: Payer: Self-pay | Admitting: Cardiology

## 2014-04-03 VITALS — BP 119/81 | HR 77 | Ht 65.0 in | Wt 149.5 lb

## 2014-04-03 DIAGNOSIS — I2510 Atherosclerotic heart disease of native coronary artery without angina pectoris: Secondary | ICD-10-CM

## 2014-04-03 DIAGNOSIS — I10 Essential (primary) hypertension: Secondary | ICD-10-CM

## 2014-04-03 MED ORDER — METOPROLOL SUCCINATE ER 25 MG PO TB24: 25.0000 mg | ORAL_TABLET | Freq: Every day | ORAL | Status: DC

## 2014-04-03 NOTE — Patient Instructions (Signed)
Start Toprol XL 25 mg daily  Lipid Profile [facility]    Your physician wants you to follow-up in: 1 year. You will receive a reminder letter in the mail two months in advance. If you don't receive a letter, please call our office to schedule the follow-up appointment.

## 2014-04-03 NOTE — Progress Notes (Signed)
HPI The patient presents for followup of a history of coronary artery disease status post CABG x5 in 2006 followup cath in 2007 showed patent grafts and improved LV function EF 65%. She had a normal stress test in May 2012.  Since I last saw her she has done well. She denies any cardiovascular symptoms.  She denies any chest pressure, neck or arm discomfort. She has no palpitations, presyncope or syncope. She has no weight gain or edema. She does work routinely and does some exercising at a gym.  No Known Allergies  Current Outpatient Prescriptions  Medication Sig Dispense Refill  . aspirin EC 81 MG tablet Take 81 mg by mouth daily.    . metoprolol succinate (TOPROL-XL) 50 MG 24 hr tablet TAKE 1 TABLET (50 MG TOTAL) BY MOUTH DAILY. 30 tablet 0  . simvastatin (ZOCOR) 80 MG tablet TAKE 1 TABLET AT BEDTIME 90 tablet 4   No current facility-administered medications for this visit.    Past Medical History  Diagnosis Date  . HTN (hypertension)   . Anxiety   . CAD (coronary artery disease)   . Dyslipidemia   . Headache(784.0)   . Migraine   . 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    Qualifier: Diagnosis of  By: Cheri GuppyAlleyne, Tiffany      Past Surgical History  Procedure Laterality Date  . Coronary artery bypass graft  2006     Coronary artery bypass grafting x5 with a left  internal  mammary to the left anterior descending coronary artery.  Free right  internal mammary to the diagonal coronary artery, sequential reverse  saphenous vein graft to the first and second obtuse marginal, right  artery bypass to the posterior descending coronary artery with endo-vein harvesting.  . Cesarean section  1984  . Bunionectomy  08/2011    right foot    ROS:  As stated in the HPI and negative for all other systems.  PHYSICAL EXAM BP 119/81 mmHg  Pulse 77  Ht 5\' 5"  (1.651 m)  Wt 149 lb 8 oz (67.813 kg)  BMI 24.88 kg/m2  LMP 11/28/2009 GENERAL:  Well appearing HEENT:  Pupils equal round and reactive, fundi  not visualized, oral mucosa unremarkable NECK:  No jugular venous distention, waveform within normal limits, carotid upstroke brisk and symmetric, no bruits, no thyromegaly LYMPHATICS:  No cervical, inguinal adenopathy LUNGS:  Clear to auscultation bilaterally BACK:  No CVA tenderness CHEST:  Unremarkable HEART:  PMI not displaced or sustained,S1 and S2 within normal limits, no S3, no S4, no clicks, no rubs, no murmurs ABD:  Flat, positive bowel sounds normal in frequency in pitch, no bruits, no rebound, no guarding, no midline pulsatile mass, no hepatomegaly, no splenomegaly EXT:  2 plus pulses throughout, no edema, no cyanosis no clubbing SKIN:  No rashes no nodules NEURO:  Cranial nerves II through XII grossly intact, motor grossly intact throughout PSYCH:  Cognitively intact, oriented to person place and time  EKG:  Sinus rhythm, rate 77, axis within normal limits, intervals within normal limits, no acute ST-T wave changes.  04/03/2014  ASSESSMENT AND PLAN  CAD:  Her symptoms are somewhat atypical.  She's had no new symptoms since her stress test in 2012. No change in therapy is indicated.  DYSLIPIDEMIA:   She has not had her blood checked and we will put her in the computer to have this done she understands the importance of this. I  HTN:  Her blood pressure is well controlled and actually is  lower than previous. I am going to reduce her metoprolol slightly.

## 2014-04-04 ENCOUNTER — Other Ambulatory Visit: Payer: Self-pay | Admitting: Cardiology

## 2014-04-04 MED ORDER — METOPROLOL SUCCINATE ER 25 MG PO TB24: 25.0000 mg | ORAL_TABLET | Freq: Every day | ORAL | Status: DC

## 2014-06-05 ENCOUNTER — Other Ambulatory Visit: Payer: Self-pay | Admitting: Cardiology

## 2014-11-10 ENCOUNTER — Other Ambulatory Visit: Payer: Self-pay | Admitting: Cardiology

## 2014-11-11 ENCOUNTER — Other Ambulatory Visit: Payer: Self-pay | Admitting: *Deleted

## 2014-11-11 MED ORDER — METOPROLOL SUCCINATE ER 25 MG PO TB24: 25.0000 mg | ORAL_TABLET | Freq: Every day | ORAL | Status: DC

## 2014-11-28 ENCOUNTER — Other Ambulatory Visit: Payer: Self-pay | Admitting: Cardiology

## 2014-11-29 ENCOUNTER — Other Ambulatory Visit: Payer: Self-pay | Admitting: *Deleted

## 2014-11-29 MED ORDER — SIMVASTATIN 80 MG PO TABS: 80.0000 mg | ORAL_TABLET | Freq: Every day | ORAL | Status: DC

## 2014-11-29 NOTE — Telephone Encounter (Signed)
Please advise on refill. Patient has not had her lipids checked since 2014, and Dr Antoine PocheHochrein discussed the importance of this with her at her last ov. Labs were scheduled twice for her but she cancelled both appointments.

## 2014-12-26 ENCOUNTER — Telehealth: Payer: Self-pay | Admitting: Cardiology

## 2014-12-26 ENCOUNTER — Ambulatory Visit (INDEPENDENT_AMBULATORY_CARE_PROVIDER_SITE_OTHER): Payer: Managed Care, Other (non HMO) | Admitting: Internal Medicine

## 2014-12-26 ENCOUNTER — Encounter: Payer: Self-pay | Admitting: Internal Medicine

## 2014-12-26 VITALS — BP 120/56 | HR 77 | Ht 65.0 in | Wt 149.7 lb

## 2014-12-26 DIAGNOSIS — R0602 Shortness of breath: Secondary | ICD-10-CM

## 2014-12-26 DIAGNOSIS — F411 Generalized anxiety disorder: Secondary | ICD-10-CM

## 2014-12-26 DIAGNOSIS — R0789 Other chest pain: Secondary | ICD-10-CM | POA: Diagnosis not present

## 2014-12-26 DIAGNOSIS — Z951 Presence of aortocoronary bypass graft: Secondary | ICD-10-CM | POA: Diagnosis not present

## 2014-12-26 DIAGNOSIS — R079 Chest pain, unspecified: Secondary | ICD-10-CM

## 2014-12-26 DIAGNOSIS — E785 Hyperlipidemia, unspecified: Secondary | ICD-10-CM

## 2014-12-26 MED ORDER — NITROGLYCERIN 0.4 MG SL SUBL: 0.4000 mg | SUBLINGUAL_TABLET | SUBLINGUAL | Status: DC | PRN

## 2014-12-26 NOTE — Telephone Encounter (Signed)
Patient states she has been experiencing "chest pain and tightness" for the past two days. She states that it "comes and goes" on it's own accord. She is also having left arm pain as well. She denies recent injury. Denies SOB or dizziness. She verbalizes that she feels the pain is cardiac in nature. Was hoping to be seen today in office. Scheduled to see Dr. Rennis Golden at 2:30pm, as Dr. Antoine Poche is not in office today. Patient verbalized understanding and appreciation for working her in today's schedule.

## 2014-12-26 NOTE — Patient Instructions (Signed)
Your physician has requested that you have an exercise stress [facility] tomorrow July 29th per Dr. Rennis Golden. For further information please visit [email or web address] Please follow instruction sheet, as given.  Your physician has recommended you make the following change in your medication..  >> Dr. Rennis Golden prescribed nitroglycerin to use as needed for chest pain  >> dissolve 1 tablet under tongue every 5 minutes as needed for pain - MAX 3 doses  Your physician recommends that you schedule a follow-up appointment with Dr. Antoine Poche next week

## 2014-12-26 NOTE — Telephone Encounter (Signed)
Pt c/o of Chest Pain: STAT if CP now or developed within 24 hours  1. Are you having CP right now? No   2. Are you experiencing any other symptoms (ex. SOB, nausea, vomiting, sweating)? Pain in her left arm   3. How long have you been experiencing CP? 2 days   4. Is your CP continuous or coming and going? Coming and going   5. Have you taken Nitroglycerin? No  ?

## 2014-12-27 ENCOUNTER — Encounter (HOSPITAL_COMMUNITY): Payer: Self-pay

## 2014-12-27 ENCOUNTER — Ambulatory Visit (HOSPITAL_COMMUNITY): Payer: Managed Care, Other (non HMO) | Attending: Cardiology

## 2014-12-27 DIAGNOSIS — R002 Palpitations: Secondary | ICD-10-CM | POA: Insufficient documentation

## 2014-12-27 DIAGNOSIS — I2510 Atherosclerotic heart disease of native coronary artery without angina pectoris: Secondary | ICD-10-CM | POA: Insufficient documentation

## 2014-12-27 DIAGNOSIS — R0602 Shortness of breath: Secondary | ICD-10-CM | POA: Diagnosis not present

## 2014-12-27 DIAGNOSIS — Z951 Presence of aortocoronary bypass graft: Secondary | ICD-10-CM | POA: Insufficient documentation

## 2014-12-27 DIAGNOSIS — R079 Chest pain, unspecified: Secondary | ICD-10-CM | POA: Insufficient documentation

## 2014-12-27 LAB — MYOCARDIAL PERFUSION IMAGING
Estimated workload: 9.3 METS
Exercise duration (min): 7 min
Exercise duration (sec): 30 s
LV dias vol: 80 mL
LV sys vol: 21 mL
MPHR: 168 {beats}/min
Peak HR: 146 {beats}/min
Percent HR: 87 %
RATE: 0.31
Rest HR: 68 {beats}/min
SDS: 3
SRS: 4
SSS: 7
TID: 1.04

## 2014-12-27 MED ORDER — TECHNETIUM TC 99M SESTAMIBI GENERIC - CARDIOLITE
10.9000 | Freq: Once | INTRAVENOUS | Status: AC | PRN
  Administered 2014-12-27: 10.9 via INTRAVENOUS

## 2014-12-27 MED ORDER — TECHNETIUM TC 99M SESTAMIBI GENERIC - CARDIOLITE
31.9000 | Freq: Once | INTRAVENOUS | Status: AC | PRN
  Administered 2014-12-27: 31.9 via INTRAVENOUS

## 2014-12-27 NOTE — Progress Notes (Signed)
CC: Chest pain  HPI The patient presents for followup of a history of coronary artery disease status post CABG x5 in 2006 followup cath in 2007 showed patent grafts and improved LV function EF 65%. She had a normal stress test in May 2012.  Since I last saw her she has done well. She denies any cardiovascular symptoms.  She denies any chest pressure, neck or arm discomfort. She has no palpitations, presyncope or syncope. She has no weight gain or edema. She does work routinely and does some exercising at a gym.  I saw Bailey Greene today's a work in for Dr. Antoine Poche for chest pain. Her past medical history as above is significant for bypass in 2006. Her last stress test was in 2012 which was normal. She reports she's been under significant stress recently and increased anxiety. She is also complaining of pressure and heaviness in the left chest with radiation down her left arm. She's had a couple episodes which were present at rest. She is also felt a little more short of breath with exercise. Her EKG today shows no evidence of ischemia, however given her compelling history I cannot exclude coronary disease.  No Known Allergies  Current Outpatient Prescriptions  Medication Sig Dispense Refill  . aspirin EC 81 MG tablet Take 81 mg by mouth daily.    . metoprolol succinate (TOPROL XL) 25 MG 24 hr tablet Take 1 tablet (25 mg total) by mouth daily. 90 tablet 3  . simvastatin (ZOCOR) 80 MG tablet Take 1 tablet (80 mg total) by mouth at bedtime. 90 tablet 3  . nitroGLYCERIN (NITROSTAT) 0.4 MG SL tablet Place 1 tablet (0.4 mg total) under the tongue every 5 (five) minutes as needed for chest pain. 25 tablet 3   No current facility-administered medications for this visit.    Past Medical History  Diagnosis Date  . HTN (hypertension)   . Anxiety   . CAD (coronary artery disease)   . Dyslipidemia   . Headache(784.0)   . Migraine   . 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    Qualifier: Diagnosis of  By:  Cheri Guppy    . Hyperlipidemia     Past Surgical History  Procedure Laterality Date  . Coronary artery bypass graft  2006     Coronary artery bypass grafting x5 with a left  internal  mammary to the left anterior descending coronary artery.  Free right  internal mammary to the diagonal coronary artery, sequential reverse  saphenous vein graft to the first and second obtuse marginal, right  artery bypass to the posterior descending coronary artery with endo-vein harvesting.  . Cesarean section  1984  . Bunionectomy  08/2011    right foot    ROS:  As stated in the HPI and negative for all other systems.  PHYSICAL EXAM BP 120/56 mmHg  Pulse 77  Ht  (1.651 m)  Wt 149 lb 11.2 oz (67.903 kg)  BMI 24.91 kg/m2  LMP 11/28/2009 GENERAL:  Well appearing HEENT:  Pupils equal round and reactive, fundi not visualized, oral mucosa unremarkable NECK:  No jugular venous distention, waveform within normal limits, carotid upstroke brisk and symmetric, no bruits, no thyromegaly LYMPHATICS:  No cervical, inguinal adenopathy LUNGS:  Clear to auscultation bilaterally BACK:  No CVA tenderness CHEST:  Unremarkable HEART:  PMI not displaced or sustained,S1 and S2 within normal limits, no S3, no S4, no clicks, no rubs, no murmurs ABD:  Flat, positive bowel sounds normal in frequency in pitch,  no bruits, no rebound, no guarding, no midline pulsatile mass, no hepatomegaly, no splenomegaly EXT:  2 plus pulses throughout, no edema, no cyanosis no clubbing SKIN:  No rashes no nodules NEURO:  Cranial nerves II through XII grossly intact, motor grossly intact throughout PSYCH:  Cognitively intact, oriented to person place and time  EKG:  Sinus rhythm, rate 77, axis within normal limits, intervals within normal limits, no acute ST-T wave changes.  12/27/2014  ASSESSMENT AND PLAN  CHEST PAIN: 2 days of progressive increase in chest discomfort and left arm pain. Some shortness of breath and symptoms  similar to prior to her bypass. I recommend she undergo nuclear stress testing and follow-up with Dr. Antoine Poche.  CAD: CABG 5 in 2006, plan for repeat stress testing.  DYSLIPIDEMIA:   She has not had her blood checked and we will put her in the computer to have this done she understands the importance of this. I  HTN:  Her blood pressure is well controlled and actually is lower than previous. I am going to reduce her metoprolol slightly.  Anxiety: Under significant life stress  Chrystie Nose, MD, [facility] Attending Cardiologist [facility]

## 2014-12-31 ENCOUNTER — Telehealth: Payer: Self-pay | Admitting: Internal Medicine

## 2014-12-31 NOTE — Telephone Encounter (Signed)
Returned call to patient 12/27/14 Celine Ahr results not available.Dr.Hilty out of office this week.Will sent message to Dr.Hilty.

## 2014-12-31 NOTE — Telephone Encounter (Signed)
Patient is returning a call regarding her test results.

## 2014-12-31 NOTE — Telephone Encounter (Signed)
Left message for pt to call.

## 2014-12-31 NOTE — Telephone Encounter (Signed)
Pt called in stating that she had a stress test on 7/29 and would like to know if those results are available. Please call  Thanks

## 2015-01-06 NOTE — Telephone Encounter (Signed)
Patient would like her test results--ok to leave a message.

## 2015-01-06 NOTE — Telephone Encounter (Signed)
LM for patient that MD has been out of office and she will be notified of results when he has reviewed them.

## 2015-01-07 NOTE — Telephone Encounter (Signed)
Stress test was negative.  Dr. Rexene Edison

## 2015-01-08 NOTE — Telephone Encounter (Signed)
Patient notified of results.

## 2015-01-09 ENCOUNTER — Telehealth: Payer: Self-pay | Admitting: Internal Medicine

## 2015-01-09 NOTE — Telephone Encounter (Signed)
Patient brought FMLA forms by office late on 01/08/15 for Dr Rennis Golden to complete and sign..  Received signed Auth, Check Pmt and FMLA forms ([employer]  Sent to [employer] to process.  Sent via Courier on 01/09/15. lp

## 2015-01-16 ENCOUNTER — Telehealth: Payer: Self-pay | Admitting: Internal Medicine

## 2015-01-16 NOTE — Telephone Encounter (Signed)
Received FMLA forms back from [employer] for Dr Rennis Golden to review and sign.  FMLA forms put in Dr Blanchie Dessert correspondence to review for 01/20/15. lp

## 2015-01-24 ENCOUNTER — Telehealth: Payer: Self-pay | Admitting: Internal Medicine

## 2015-01-24 NOTE — Telephone Encounter (Signed)
Received signed FMLA forms ([employer]) back from Dr Rennis Golden.  Faxed to [facility] & mailed patient copy of form. lp

## 2015-03-31 ENCOUNTER — Ambulatory Visit (INDEPENDENT_AMBULATORY_CARE_PROVIDER_SITE_OTHER): Payer: Managed Care, Other (non HMO)

## 2015-03-31 ENCOUNTER — Telehealth: Payer: Self-pay | Admitting: Cardiology

## 2015-03-31 ENCOUNTER — Ambulatory Visit (INDEPENDENT_AMBULATORY_CARE_PROVIDER_SITE_OTHER): Payer: Managed Care, Other (non HMO) | Admitting: Internal Medicine

## 2015-03-31 VITALS — BP 120/72 | HR 82 | Temp 98.3°F | Resp 16 | Ht 65.0 in | Wt 151.6 lb

## 2015-03-31 DIAGNOSIS — M545 Low back pain, unspecified: Secondary | ICD-10-CM

## 2015-03-31 DIAGNOSIS — S39012A Strain of muscle, fascia and tendon of lower back, initial encounter: Secondary | ICD-10-CM

## 2015-03-31 MED ORDER — CYCLOBENZAPRINE HCL 10 MG PO TABS: 10.0000 mg | ORAL_TABLET | Freq: Every day | ORAL | Status: DC

## 2015-03-31 MED ORDER — MELOXICAM 15 MG PO TABS: 15.0000 mg | ORAL_TABLET | Freq: Every day | ORAL | Status: DC

## 2015-03-31 NOTE — Telephone Encounter (Signed)
Left message to call.

## 2015-03-31 NOTE — Progress Notes (Signed)
Subjective:  This chart was scribed for Ellamae Siaobert Koya Hunger, MD by Andrew Auaven Small, ED Scribe. This patient was seen in room 3 and the patient's care was started at 3:12 PM.   Patient ID: Bailey Greene, female    DOB: 09/16/1960, 54 y.o.   MRN: [id number]  HPI Chief Complaint  Patient presents with  . Back Pain    lower and middle back, x 1 week, describes pain as sharp, worse when moving around   HPI Comments: Bailey Greene is a 54 y.o. female who presents to the [facility] complaining of sharp middle-low back pain that began 1 week. She denies injury or fall. Went to zumba 1 day prior to back pain but this is normal for her. She has worsening pain with certain movements such as lifting heavy objects, sitting for long periods of time and changing position. She feels as if she needs to pop her back. She denies hx of back pain.  She denies dysuria and abdominal pain.  Pt has not been to work for a week due to pain.  No hx of osteoporosis. Is postmenopausal  Hx of CAD. CABG x 5 10 years ago.  Hx of Carpal tunnel.   Past Medical History  Diagnosis Date  . HTN (hypertension)   . Anxiety   . CAD (coronary artery disease)   . Dyslipidemia   . Headache(784.0)   . Migraine   . 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    Qualifier: Diagnosis of  By: Cheri GuppyAlleyne, Tiffany    . Hyperlipidemia    Prior to Admission medications   Medication Sig Start Date End Date Taking? Authorizing Provider  aspirin EC 81 MG tablet Take 81 mg by mouth daily.   Yes Historical Provider, MD  metoprolol succinate (TOPROL XL) 25 MG 24 hr tablet Take 1 tablet (25 mg total) by mouth daily. 11/11/14  Yes Rollene RotundaJames Hochrein, MD  nitroGLYCERIN (NITROSTAT) 0.4 MG SL tablet Place 1 tablet (0.4 mg total) under the tongue every 5 (five) minutes as needed for chest pain. 12/26/14  Yes Chrystie NoseKenneth C Hilty, MD  simvastatin (ZOCOR) 80 MG tablet Take 1 tablet (80 mg total) by mouth at bedtime. 11/29/14  Yes Rollene RotundaJames Hochrein, MD   Review  of Systems  Constitutional: Negative for fever and chills.  Respiratory: Negative for cough and shortness of breath.   Gastrointestinal: Negative for nausea, abdominal pain, diarrhea and constipation.  Genitourinary: Negative for dysuria and difficulty urinating.  Musculoskeletal: Positive for myalgias and back pain. Negative for gait problem.  Neurological: Negative for weakness and numbness.       Objective:   Physical Exam  Constitutional: She is oriented to person, place, and time. She appears well-developed and well-nourished. No distress.  HENT:  Head: Normocephalic and atraumatic.  Eyes: Conjunctivae and EOM are normal.  Neck: Neck supple.  Cardiovascular: Normal rate, regular rhythm and normal heart sounds.   Pulmonary/Chest: Effort normal and breath sounds normal. She has no wheezes. She has no rales.  Musculoskeletal: Normal range of motion.  TTP over the upper lumbar area and bilaterally over lumbar muscles. Straight leg raise is negative. Pain is worse with twisting and bending. TTP especially over spinous process L2-3. No sensory or motor loss in extremities.   Neurological: She is alert and oriented to person, place, and time.  Skin: Skin is warm and dry.  Psychiatric: She has a normal mood and affect. Her behavior is normal.  Nursing note and vitals reviewed.  Filed Vitals:  03/31/15 1422  BP: 120/72  Pulse: 82  Temp: 98.3 F (36.8 C)  TempSrc: Oral  Resp: 16  Height:  (1.651 m)  Weight: 151 lb 9.6 oz (68.765 kg)  SpO2: 96%   [facility] reading (PRIMARY) by Dr. Merla Riches. Lumbar spine-Spine appears normal without compression fracture  Assessment & Plan:   1. Midline low back pain without sciatica   2. Lumbar strain, initial encounter      Heat/stretch OOW this week(and last) Meds ordered this encounter  Medications  . cyclobenzaprine (FLEXERIL) 10 MG tablet    Sig: Take 1 tablet (10 mg total) by mouth at bedtime.    Dispense:  15 tablet    Refill:   0  . meloxicam (MOBIC) 15 MG tablet    Sig: Take 1 tablet (15 mg total) by mouth daily.    Dispense:  30 tablet    Refill:  0  f/u 1 week if not ready for work  By signing my name below, I, Raven Small, attest that this documentation has been prepared under the direction and in the presence of Ellamae Sia, MD.  Electronically Signed: Andrew Au, ED Scribe. 03/31/2015. 3:33 PM.  I have completed the patient encounter in its entirety as documented by the scribe, with editing by me where necessary. Natallia Stellmach P. Merla Riches, M.D.

## 2015-03-31 NOTE — Telephone Encounter (Signed)
Patient states she has had chest pain off and on for the past 2-3 days. She did schedule to see Dr. [place] LionsHochrien tomorrow at 8:00 am.

## 2015-04-01 ENCOUNTER — Encounter: Payer: Self-pay | Admitting: Cardiology

## 2015-04-01 ENCOUNTER — Ambulatory Visit (INDEPENDENT_AMBULATORY_CARE_PROVIDER_SITE_OTHER): Payer: Managed Care, Other (non HMO) | Admitting: Cardiology

## 2015-04-01 VITALS — BP 118/80 | HR 89 | Ht 65.0 in | Wt 150.3 lb

## 2015-04-01 DIAGNOSIS — E785 Hyperlipidemia, unspecified: Secondary | ICD-10-CM

## 2015-04-01 DIAGNOSIS — I2510 Atherosclerotic heart disease of native coronary artery without angina pectoris: Secondary | ICD-10-CM | POA: Diagnosis not present

## 2015-04-01 DIAGNOSIS — I10 Essential (primary) hypertension: Secondary | ICD-10-CM

## 2015-04-01 MED ORDER — ISOSORBIDE MONONITRATE ER 30 MG PO TB24: 30.0000 mg | ORAL_TABLET | Freq: Every day | ORAL | Status: DC

## 2015-04-01 NOTE — Patient Instructions (Addendum)
Your physician wants you to follow-up in: 6 Months. You will receive a reminder letter in the mail two months in advance. If you don't receive a letter, please call our office to schedule the follow-up appointment.  Your physician has recommended you make the following change in your medication: START Isosorbide 30 mg daily  Your physician recommends that you return for lab work Fasting Lipids

## 2015-04-01 NOTE — Progress Notes (Signed)
HPI The patient presents for followup of a history of coronary artery disease status post CABG x5 in 2006 followup cath in 2007 showed patent grafts and improved LV function EF 65%. She had a normal stress test in May 2012.  She presented recently with chest pain.  She again had a Lexiscan Myoview that was negative for ischemia.  She returns for follow up.  She reports that her symptoms happen with stress at work.  She can exercise on her treadmill without bringing on any of these symptoms. She actually says that since she's been off work and she is not needing nitroglycerin. She reports a lot of anxiety related.   No Known Allergies  Current Outpatient Prescriptions  Medication Sig Dispense Refill  . aspirin EC 81 MG tablet Take 81 mg by mouth daily.    . cyclobenzaprine (FLEXERIL) 10 MG tablet Take 1 tablet (10 mg total) by mouth at bedtime. 15 tablet 0  . meloxicam (MOBIC) 15 MG tablet Take 1 tablet (15 mg total) by mouth daily. 30 tablet 0  . metoprolol succinate (TOPROL XL) 25 MG 24 hr tablet Take 1 tablet (25 mg total) by mouth daily. 90 tablet 3  . nitroGLYCERIN (NITROSTAT) 0.4 MG SL tablet Place 1 tablet (0.4 mg total) under the tongue every 5 (five) minutes as needed for chest pain. 25 tablet 3  . simvastatin (ZOCOR) 80 MG tablet Take 1 tablet (80 mg total) by mouth at bedtime. 90 tablet 3   No current facility-administered medications for this visit.    Past Medical History  Diagnosis Date  . HTN (hypertension)   . Anxiety   . CAD (coronary artery disease)   . Dyslipidemia   . Headache(784.0)   . Migraine   . 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    Qualifier: Diagnosis of  By: Cheri Guppy    . Hyperlipidemia     Past Surgical History  Procedure Laterality Date  . Coronary artery bypass graft  2006     Coronary artery bypass grafting x5 with a left  internal  mammary to the left anterior descending coronary artery.  Free right  internal mammary to the diagonal coronary  artery, sequential reverse  saphenous vein graft to the first and second obtuse marginal, right  artery bypass to the posterior descending coronary artery with endo-vein harvesting.  . Cesarean section  1984  . Bunionectomy  08/2011    right foot    ROS:  As stated in the HPI and negative for all other systems.  PHYSICAL EXAM BP 118/80 mmHg  Pulse 89  Ht  (1.651 m)  Wt 150 lb 4.8 oz (68.176 kg)  BMI 25.01 kg/m2  LMP 11/28/2009 GENERAL:  Well appearing NECK:  No jugular venous distention, waveform within normal limits, carotid upstroke brisk and symmetric, no bruits, no thyromegaly LYMPHATICS:  No cervical, inguinal adenopathy LUNGS:  Clear to auscultation bilaterally CHEST:  Unremarkable HEART:  PMI not displaced or sustained,S1 and S2 within normal limits, no S3, no S4, no clicks, no rubs, no murmurs ABD:  Flat, positive bowel sounds normal in frequency in pitch, no bruits, no rebound, no guarding, no midline pulsatile mass, no hepatomegaly, no splenomegaly EXT:  2 plus pulses throughout, no edema, no cyanosis no clubbing SKIN:  No rashes no nodules PSYCH:  Cognitively intact, oriented to person place and time, somewhat anxious appearing and slightly tearful   ASSESSMENT AND PLAN   CAD:  She has some chronic chest pain symptoms and symptoms  related to stress and anxiety. She had a low risk nuclear study. I'm going to start her on Imdur 30 mg daily. She'll let me know if her symptoms worsen.  DYSLIPIDEMIA:   I will have her come back for a lipid profile.    HTN:  Her blood pressure is well controlled and actually is lower than previous. I am going to reduce her metoprolol slightly.

## 2015-04-02 NOTE — Telephone Encounter (Signed)
Seen by Dr. Antoine PocheHochrein on 11/1 - started on Isosorbide & instructed to f/u in 6 mo.

## 2015-04-28 ENCOUNTER — Telehealth: Payer: Self-pay | Admitting: Cardiology

## 2015-04-28 NOTE — Telephone Encounter (Signed)
Received signed Attending Physician Statement Monia Pouch([employer]) back from Dr Antoine PocheHochrein.  Faxed signed Attending Physician Statement Monia Pouch([employer] to [place] on 04/28/15. lp

## 2015-05-06 ENCOUNTER — Other Ambulatory Visit: Payer: Self-pay | Admitting: Internal Medicine

## 2015-05-07 NOTE — Telephone Encounter (Signed)
Do you want to RF? 

## 2015-05-07 NOTE — Telephone Encounter (Signed)
Meds ordered this encounter  Medications  . meloxicam (MOBIC) 15 MG tablet    Sig: TAKE 1 TABLET (15 MG TOTAL) BY MOUTH DAILY.    Dispense:  30 tablet    Refill:  1   Offer fu if not getting well

## 2015-05-07 NOTE — Telephone Encounter (Signed)
LMOM for pt that Dr Merla Richesoolittle RFd her mobic, and to offer f/up if she is not improving.

## 2015-09-11 ENCOUNTER — Telehealth: Payer: Self-pay | Admitting: Cardiology

## 2015-09-11 NOTE — Telephone Encounter (Signed)
[facility] [employer].  Received signed AUTH[facility]Money [facility] and [facility] forms.  Sent to [employer] @ [employer] to process and return for DR Hochrein to complete and sign. Sent to [employer] via [employer] on 09/11/15. lp

## 2015-09-18 ENCOUNTER — Telehealth: Payer: Self-pay | Admitting: Cardiology

## 2015-09-18 NOTE — Telephone Encounter (Signed)
Received [facility] back [facility] for [facility] Antoine PocheHochrein to review, complete and sign.  Forms given to Rande BruntN Thomas for Dr Antoine PocheHochrein to sign. lp

## 2015-09-26 ENCOUNTER — Telehealth: Payer: Self-pay | Admitting: Cardiology

## 2015-09-26 NOTE — Telephone Encounter (Signed)
Received signed FMLA forms back from Dr Antoine PocheHochrein.  Notified patient and [facility]ed to [facility] 09/26/15. lp

## 2015-11-03 ENCOUNTER — Other Ambulatory Visit: Payer: Self-pay | Admitting: Cardiology

## 2015-11-03 NOTE — Telephone Encounter (Signed)
Rx(s) sent to pharmacy electronically.  

## 2015-12-09 ENCOUNTER — Other Ambulatory Visit: Payer: Self-pay | Admitting: Cardiology

## 2016-02-04 ENCOUNTER — Other Ambulatory Visit: Payer: Self-pay | Admitting: Cardiology

## 2016-03-02 ENCOUNTER — Telehealth: Payer: Self-pay | Admitting: Cardiology

## 2016-03-02 NOTE — Telephone Encounter (Signed)
Received FMLA forms from [employer] @ [employer] for Dr Antoine PocheHochrein to review, complete and sign.  Forms given to Max FickleNya Thomas, for Dr Antoine PocheHochrein to sign. lp

## 2016-03-03 ENCOUNTER — Telehealth: Payer: Self-pay | Admitting: Cardiology

## 2016-03-03 NOTE — Telephone Encounter (Signed)
Received Signed FMLA Forms back from Dr Antoine PocheHochrein.  Notified patient forms signed and ready.  (LMOM) lp

## 2016-05-06 ENCOUNTER — Other Ambulatory Visit: Payer: Self-pay | Admitting: Cardiology

## 2016-05-20 ENCOUNTER — Ambulatory Visit: Payer: Managed Care, Other (non HMO) | Admitting: Cardiology

## 2016-06-02 ENCOUNTER — Telehealth: Payer: Self-pay | Admitting: Cardiology

## 2016-06-02 NOTE — Telephone Encounter (Signed)
Received F[facility]A forms from [facility] [employer] for Dr Antoine PocheHochrein to review, complete and sign.  Forms given to Nya T for Dr Hochrein's schedule on 1/4/1. lp

## 2016-06-16 ENCOUNTER — Other Ambulatory Visit: Payer: Self-pay | Admitting: Cardiology

## 2016-06-16 ENCOUNTER — Ambulatory Visit: Payer: Managed Care, Other (non HMO) | Admitting: Cardiology

## 2016-07-07 NOTE — Progress Notes (Signed)
HPI The patient presents for followup of a history of coronary artery disease status post CABG x5 in 2006 followup cath in 2007 showed patent grafts and improved LV function EF 65%. She had a normal stress test in May 2012 and again in July 2016.  Since I last saw her she has had continued chest discomfort occasionally. She's had these on and off for some time and seems to be a similar pattern to previous. She said she hasn't used nitroglycerin in at least a month. She can walk on a treadmill without bringing on the symptoms. She might get them she stressed at work. She might get when she lies down. She might have her heart racing at night occasionally when she lies down but this is not routine. She doesn't describe any new shortness of breath, PND or orthopnea. She doesn't have any weight gain or edema. She doesn't have any presyncope or syncope.   No Known Allergies  Current Outpatient Prescriptions  Medication Sig Dispense Refill  . aspirin EC 81 MG tablet Take 81 mg by mouth daily.    . isosorbide mononitrate (IMDUR) 30 MG 24 hr tablet Take 1 tablet (30 mg total) by mouth daily. 90 tablet 3  . meloxicam (MOBIC) 15 MG tablet TAKE 1 TABLET (15 MG TOTAL) BY MOUTH DAILY. 30 tablet 1  . metoprolol succinate (TOPROL-XL) 25 MG 24 hr tablet Take 1 tablet (25 mg total) by mouth daily. 30 tablet 0  . nitroGLYCERIN (NITROSTAT) 0.4 MG SL tablet Place 1 tablet (0.4 mg total) under the tongue every 5 (five) minutes as needed for chest pain. 25 tablet 3  . simvastatin (ZOCOR) 80 MG tablet TAKE 1 TABLET (80 MG TOTAL) BY MOUTH AT BEDTIME. 90 tablet 3   No current facility-administered medications for this visit.     Past Medical History:  Diagnosis Date  . Anxiety   . CAD (coronary artery disease)   . 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   Qualifier: Diagnosis of  By: Cheri GuppyAlleyne, Tiffany    . Dyslipidemia   . Headache(784.0)   . HTN (hypertension)   . Hyperlipidemia   . Migraine     Past Surgical History:    Procedure Laterality Date  . BUNIONECTOMY  08/2011   right foot  . CESAREAN SECTION  1984  . CORONARY ARTERY BYPASS GRAFT  2006    Coronary artery bypass grafting x5 with a left  internal  mammary to the left anterior descending coronary artery.  Free right  internal mammary to the diagonal coronary artery, sequential reverse  saphenous vein graft to the first and second obtuse marginal, right  artery bypass to the posterior descending coronary artery with endo-vein harvesting.    ROS:   As stated in the HPI and negative for all other systems.  PHYSICAL EXAM BP 116/74   Pulse 80   Ht 5\' 4"  (1.626 m)   Wt 147 lb 6.4 oz (66.9 kg)   LMP 11/28/2009   BMI 25.30 kg/m  GENERAL:  Well appearing NECK:  No jugular venous distention, waveform within normal limits, carotid upstroke brisk and symmetric, no bruits, no thyromegaly LYMPHATICS:  No cervical, inguinal adenopathy LUNGS:  Clear to auscultation bilaterally CHEST:  Unremarkable HEART:  PMI not displaced or sustained,S1 and S2 within normal limits, no S3, no S4, no clicks, no rubs, no murmurs ABD:  Flat, positive bowel sounds normal in frequency in pitch, no bruits, no rebound, no guarding, no midline pulsatile mass, no hepatomegaly, no splenomegaly EXT:  2 plus pulses throughout, no edema, no cyanosis no clubbing SKIN:  No rashes no nodules PSYCH:  Cognitively intact, oriented to person place and time, somewhat anxious appearing and slightly tearful  EKG:  Sinus rhythm, rate 80, axis within normal limits, poor anterior R wave progression, no acute ST-T wave changes.  ASSESSMENT AND PLAN   CAD:  The patient has a vague pattern of chest discomfort that is difficult to sort out.  I will bring the patient back for a POET (Plain Old Exercise Test). This will allow me to screen for obstructive coronary disease, risk stratify and very importantly provide a prescription for exercise.  DYSLIPIDEMIA:   She is going to come back for fasting  lipid profile and liver enzymes. She's not had other blood work recently saw a CBC and basic metabolic profile as well.  HTN:  The blood pressure is at target. No change in medications is indicated. We will continue with therapeutic lifestyle changes (TLC).

## 2016-07-08 ENCOUNTER — Other Ambulatory Visit: Payer: Self-pay | Admitting: Cardiology

## 2016-07-08 ENCOUNTER — Encounter: Payer: Self-pay | Admitting: Cardiology

## 2016-07-08 ENCOUNTER — Ambulatory Visit (INDEPENDENT_AMBULATORY_CARE_PROVIDER_SITE_OTHER): Payer: Managed Care, Other (non HMO) | Admitting: Cardiology

## 2016-07-08 VITALS — BP 116/74 | HR 80 | Ht 64.0 in | Wt 147.4 lb

## 2016-07-08 DIAGNOSIS — E785 Hyperlipidemia, unspecified: Secondary | ICD-10-CM

## 2016-07-08 DIAGNOSIS — Z79899 Other long term (current) drug therapy: Secondary | ICD-10-CM | POA: Diagnosis not present

## 2016-07-08 DIAGNOSIS — I25119 Atherosclerotic heart disease of native coronary artery with unspecified angina pectoris: Secondary | ICD-10-CM

## 2016-07-08 DIAGNOSIS — I10 Essential (primary) hypertension: Secondary | ICD-10-CM | POA: Diagnosis not present

## 2016-07-08 NOTE — Patient Instructions (Addendum)
Medication Instructions: Your physician recommends that you continue on your current medications as directed. Please refer to the Current Medication list given to you today.  Labwork: CBC,BMP,FASTING LIPID LIVER  Testing/Procedures: Your physician has requested that you have an exercise tolerance test. For further information please visit [email or web address]. Please also follow instruction sheet, as given.  Follow-Up: Your physician wants you to follow-up in: 12 months [facility]. You will receive a reminder letter in the mail two months in advance. If you don't receive a letter, please call our office to schedule the follow-up appointment.  Any Other Special Instructions Will Be Listed Below (If Applicable).   If you need a refill on your cardiac medications before your next appointment, please call your pharmacy.

## 2016-07-15 ENCOUNTER — Encounter (HOSPITAL_COMMUNITY): Payer: Managed Care, Other (non HMO)

## 2016-07-28 ENCOUNTER — Ambulatory Visit (INDEPENDENT_AMBULATORY_CARE_PROVIDER_SITE_OTHER): Payer: 59 | Admitting: Physician Assistant

## 2016-07-28 ENCOUNTER — Telehealth: Payer: Self-pay | Admitting: Emergency Medicine

## 2016-07-28 ENCOUNTER — Ambulatory Visit (INDEPENDENT_AMBULATORY_CARE_PROVIDER_SITE_OTHER): Payer: 59

## 2016-07-28 VITALS — BP 108/74 | HR 90 | Temp 98.5°F | Resp 18 | Ht 64.0 in | Wt 145.0 lb

## 2016-07-28 DIAGNOSIS — M5010 Cervical disc disorder with radiculopathy, unspecified cervical region: Secondary | ICD-10-CM

## 2016-07-28 DIAGNOSIS — Z131 Encounter for screening for diabetes mellitus: Secondary | ICD-10-CM

## 2016-07-28 DIAGNOSIS — E1165 Type 2 diabetes mellitus with hyperglycemia: Secondary | ICD-10-CM

## 2016-07-28 DIAGNOSIS — E118 Type 2 diabetes mellitus with unspecified complications: Secondary | ICD-10-CM

## 2016-07-28 DIAGNOSIS — IMO0002 Reserved for concepts with insufficient information to code with codable children: Secondary | ICD-10-CM

## 2016-07-28 LAB — POCT GLYCOSYLATED HEMOGLOBIN (HGB A1C): Hemoglobin A1C: 8.6

## 2016-07-28 LAB — GLUCOSE, POCT (MANUAL RESULT ENTRY): POC Glucose: 130 mg/dl — AB (ref 70–99)

## 2016-07-28 MED ORDER — TRAMADOL HCL 50 MG PO TABS: 50.0000 mg | ORAL_TABLET | Freq: Three times a day (TID) | ORAL | 0 refills | Status: DC | PRN

## 2016-07-28 MED ORDER — METFORMIN HCL 1000 MG PO TABS: ORAL_TABLET | ORAL | 3 refills | Status: DC

## 2016-07-28 NOTE — Progress Notes (Signed)
07/31/2016 1:44 PM   DOB: 03/30/1961 / MRN: [id number]  SUBJECTIVE:  Bailey Greene is a 56 y.o. female presenting for left arm pain that started 8 days ago.  She denies any acute injury to the area and points to the bicep.  Denies any known injury. She denies fever, chills, nausea, abdominal or chest pain, SOB, DOE.  Denies leg swelling. Tells me the pain is not getting better or worse.  Has tried aleve and tylenol.   She has No Known Allergies.   She  has a past medical history of Anxiety; CAD (coronary artery disease); COMMON MIGRAINE (01/31/2007); Dyslipidemia; Headache(784.0); HTN (hypertension); Hyperlipidemia; and Migraine.    She  reports that she quit smoking about 3 years ago. Her smoking use included Cigarettes. She has a 4.00 pack-year smoking history. She has never used smokeless tobacco. She reports that she does not drink alcohol or use drugs. She  reports that she currently engages in sexual activity. The patient  has a past surgical history that includes Coronary artery bypass graft (2006); Cesarean section (1984); and Bunionectomy (08/2011).  Her family history includes Diabetes in her father and paternal grandmother; Heart attack in her father; Hypertension in her father and paternal grandmother; Lupus in her mother.  Review of Systems  Constitutional: Negative for fever.  Musculoskeletal: Positive for myalgias and neck pain. Negative for back pain, falls and joint pain.  Skin: Negative for rash.  Neurological: Positive for sensory change. Negative for dizziness, focal weakness and headaches.  Endo/Heme/Allergies: Negative for polydipsia.    The problem list and medications were reviewed and updated by myself where necessary and exist elsewhere in the encounter.   OBJECTIVE:  BP 108/74 (BP Location: Right Arm, Patient Position: Sitting, Cuff Size: Small)   Pulse 90   Temp 98.5 F (36.9 C) (Oral)   Resp 18   Ht 5' 4" (1.626 m)   Wt 145 lb (65.8 kg)   LMP 11/28/2009    SpO2 100%   BMI 24.89 kg/m   Physical Exam  Constitutional: She is oriented to person, place, and time. She appears well-developed and well-nourished.  HENT:  Mouth/Throat: No oropharyngeal exudate.  Pulmonary/Chest: Effort normal and breath sounds normal.  Musculoskeletal: She exhibits no edema, tenderness or deformity.  Neurological: She is alert and oriented to person, place, and time. She displays normal reflexes. No cranial nerve deficit. She exhibits normal muscle tone. Coordination normal.  Skin: Skin is warm and dry.  Psychiatric: She has a normal mood and affect.    Results for orders placed or performed in visit on 07/28/16 (from the past 72 hour(s))  POCT glucose (manual entry)     Status: Abnormal   Collection Time: 07/28/16  3:52 PM  Result Value Ref Range   POC Glucose 130 (A) 70 - 99 mg/dl  POCT glycosylated hemoglobin (Hb A1C)     Status: None   Collection Time: 07/28/16  4:19 PM  Result Value Ref Range   Hemoglobin A1C 8.6   CMP14+EGFR     Status: Abnormal   Collection Time: 07/28/16  4:25 PM  Result Value Ref Range   Glucose 120 (H) 65 - 99 mg/dL   BUN 7 6 - 24 mg/dL   Creatinine, Ser 0.68 0.57 - 1.00 mg/dL   GFR calc non Af Amer 100 >59 mL/min/1.73   GFR calc Af Amer 115 >59 mL/min/1.73   BUN/Creatinine Ratio 10 9 - 23   Sodium 143 134 - 144 mmol/L   Potassium 4.1 3.5 -   5.2 mmol/L   Chloride 100 96 - 106 mmol/L   CO2 23 18 - 29 mmol/L   Calcium 10.4 (H) 8.7 - 10.2 mg/dL   Total Protein 7.5 6.0 - 8.5 g/dL   Albumin 5.0 3.5 - 5.5 g/dL   Globulin, Total 2.5 1.5 - 4.5 g/dL   Albumin/Globulin Ratio 2.0 1.2 - 2.2   Bilirubin Total 0.6 0.0 - 1.2 mg/dL   Alkaline Phosphatase 119 (H) 39 - 117 IU/L   AST 16 0 - 40 IU/L   ALT 15 0 - 32 IU/L   Lab Results  Component Value Date   CHOL 240 (H) 06/22/2012   HDL 70 06/22/2012   LDLCALC 148 (H) 06/22/2012   LDLDIRECT 145.2 07/18/2008   TRIG 112 06/22/2012   CHOLHDL 3.4 06/22/2012     No results  found.   ASSESSMENT AND PLAN:  Hallee was seen today for arm pain.  Diagnoses and all orders for this visit:  Cervical disc disorder with radiculopathy of cervical region Comments: Unfortunately she is a poor candidate for PO steroids.  Will avoid NSAIDs given history of CABG.  Will start tramadol.  Orders: -     DG Cervical Spine Complete; Future -     traMADol (ULTRAM) 50 MG tablet; Take 1 tablet (50 mg total) by mouth every 8 (eight) hours as needed.  Encounter for screening examination for intermediate hyperglycemia and diabetes mellitus -     POCT glucose (manual entry) -     POCT glycosylated hemoglobin (Hb A1C)  Uncontrolled type 2 diabetes mellitus with complication, without long-term current use of insulin (HCC) Comments: Starting metformin.  Follow up in 3-6 weeks for recheck and will begin diabetic standard of care at that time.  Orders: -     CMP14+EGFR -     metFORMIN (GLUCOPHAGE) 1000 MG tablet; Take 1/2 tab in the morning for 7 days. This progress to 1/2 tab in the morning and night for 7 days. At day 15 begin taking a full tab in the morning and night and continue.    The patient is advised to call or return to clinic if she does not see an improvement in symptoms, or to seek the care of the closest emergency department if she worsens with the above plan.   Philis Fendt, MHS, PA-C [facility] 07/31/2016 1:44 PM

## 2016-07-28 NOTE — Patient Instructions (Signed)
We recommend that you schedule a mammogram for breast cancer screening. Typically, you do not need a referral to do this. Please contact a local imaging center to schedule your mammogram.  [facility] - [phone]  *ask for the Radiology Department [facility]) - [phone] or [phone]  [facility] - [phone] [facility] - [phone] [facility] - [phone]  *ask for the Radiology Department  [facility] - [phone]  *ask for the Radiology Department [facility] - [phone]  *ask for the Mammography Department [facility] - [phone]     IF you received an x-ray today, you will receive an invoice from [facility]. Please contact [facility] at [phone] with questions or concerns regarding your invoice.   IF you received labwork today, you will receive an invoice from [facility]. Please contact [facility] at [phone] with questions or concerns regarding your invoice.   Our billing staff will not be able to assist you with questions regarding bills from these companies.  You will be contacted with the lab results as soon as they are available. The fastest way to get your results is to activate your My Chart account. Instructions are located on the last page of this paperwork. If you have not heard from us regarding the results in 2 weeks, please contact this office.      

## 2016-07-28 NOTE — Telephone Encounter (Signed)
Patient states, when she went to pick up Tramadol script, she was told medication need PA  Key: [id number]

## 2016-07-29 DIAGNOSIS — E119 Type 2 diabetes mellitus without complications: Secondary | ICD-10-CM

## 2016-07-29 DIAGNOSIS — E118 Type 2 diabetes mellitus with unspecified complications: Secondary | ICD-10-CM

## 2016-07-29 HISTORY — DX: Type 2 diabetes mellitus without complications: E11.9

## 2016-07-29 LAB — CMP14+EGFR
ALT: 15 IU/L (ref 0–32)
AST: 16 IU/L (ref 0–40)
Albumin/Globulin Ratio: 2 (ref 1.2–2.2)
Albumin: 5 g/dL (ref 3.5–5.5)
Alkaline Phosphatase: 119 IU/L — ABNORMAL HIGH (ref 39–117)
BUN/Creatinine Ratio: 10 (ref 9–23)
BUN: 7 mg/dL (ref 6–24)
Bilirubin Total: 0.6 mg/dL (ref 0.0–1.2)
CO2: 23 mmol/L (ref 18–29)
Calcium: 10.4 mg/dL — ABNORMAL HIGH (ref 8.7–10.2)
Chloride: 100 mmol/L (ref 96–106)
Creatinine, Ser: 0.68 mg/dL (ref 0.57–1.00)
GFR calc Af Amer: 115 mL/min/{1.73_m2} (ref >59)
GFR calc non Af Amer: 100 mL/min/{1.73_m2} (ref >59)
Globulin, Total: 2.5 g/dL (ref 1.5–4.5)
Glucose: 120 mg/dL — ABNORMAL HIGH (ref 65–99)
Potassium: 4.1 mmol/L (ref 3.5–5.2)
Sodium: 143 mmol/L (ref 134–144)
Total Protein: 7.5 g/dL (ref 6.0–8.5)

## 2016-07-30 ENCOUNTER — Ambulatory Visit: Payer: Managed Care, Other (non HMO) | Admitting: Family

## 2016-07-30 NOTE — Telephone Encounter (Signed)
Received PA via fax for tramadol.  It is approved until 07/30/2017.  Patient aware.

## 2016-08-04 ENCOUNTER — Telehealth (HOSPITAL_COMMUNITY): Payer: Self-pay

## 2016-08-04 NOTE — Telephone Encounter (Signed)
Encounter complete. 

## 2016-08-05 ENCOUNTER — Ambulatory Visit (HOSPITAL_COMMUNITY)
Admission: RE | Admit: 2016-08-05 | Discharge: 2016-08-05 | Disposition: A | Discharge status: Home / Self Care | Payer: Managed Care, Other (non HMO) | Source: Ambulatory Visit | Attending: Internal Medicine | Admitting: Internal Medicine

## 2016-08-05 DIAGNOSIS — I25119 Atherosclerotic heart disease of native coronary artery with unspecified angina pectoris: Secondary | ICD-10-CM | POA: Insufficient documentation

## 2016-08-05 LAB — EXERCISE TOLERANCE TEST
Estimated workload: 8.5 METS
Exercise duration (min): 7 min
Exercise duration (sec): 1 s
MPHR: 167 {beats}/min
Peak HR: 151 {beats}/min
Percent HR: 90 %
RPE: 17
Rest HR: 77 {beats}/min

## 2016-08-09 ENCOUNTER — Ambulatory Visit: Payer: 59 | Admitting: Physician Assistant

## 2016-08-13 ENCOUNTER — Ambulatory Visit (INDEPENDENT_AMBULATORY_CARE_PROVIDER_SITE_OTHER): Payer: 59 | Admitting: Physician Assistant

## 2016-08-13 VITALS — BP 118/80 | HR 78 | Temp 98.7°F | Resp 16 | Ht 64.0 in | Wt 146.0 lb

## 2016-08-13 DIAGNOSIS — Z23 Encounter for immunization: Secondary | ICD-10-CM | POA: Diagnosis not present

## 2016-08-13 DIAGNOSIS — F17200 Nicotine dependence, unspecified, uncomplicated: Secondary | ICD-10-CM | POA: Diagnosis not present

## 2016-08-13 DIAGNOSIS — E1165 Type 2 diabetes mellitus with hyperglycemia: Secondary | ICD-10-CM | POA: Diagnosis not present

## 2016-08-13 DIAGNOSIS — IMO0002 Reserved for concepts with insufficient information to code with codable children: Secondary | ICD-10-CM

## 2016-08-13 DIAGNOSIS — E118 Type 2 diabetes mellitus with unspecified complications: Secondary | ICD-10-CM | POA: Diagnosis not present

## 2016-08-13 DIAGNOSIS — Z8669 Personal history of other diseases of the nervous system and sense organs: Secondary | ICD-10-CM

## 2016-08-13 LAB — POCT GLYCOSYLATED HEMOGLOBIN (HGB A1C): Hemoglobin A1C: 7.6

## 2016-08-13 MED ORDER — NICOTINE 7 MG/24HR TD PT24: 7.0000 mg | MEDICATED_PATCH | Freq: Every day | TRANSDERMAL | 1 refills | Status: DC

## 2016-08-13 NOTE — Progress Notes (Signed)
08/13/2016 11:12 AM   DOB: 06/15/1960 / MRN: [id number]  SUBJECTIVE:  Bailey Greene is a 56 y.o. female presenting for diabetic follow up.  Former smoker. Is taking a statin.  She has a history of CAD and is s/p bypass managed by cardiology.  Medication compliant. Staed on metformin at my last visit and she is now taking 2 grams of metformin daily.    Tells me she has a history of right sided cluster HA and at one point was being managed by [facility] and was getting botox injections which caused her HA to go "into remission." She denies HA at this time but does think the problem is re-emerging.  She would like me to refer her back to her HA doctor at the [facility] for re-evaluation. She denies any weakness, paresthesia, confusion with the HA.    Current Outpatient Prescriptions:  .  aspirin EC 81 MG tablet, Take 81 mg by mouth daily., Disp: , Rfl:  .  metFORMIN (GLUCOPHAGE) 1000 MG tablet, Take 1/2 tab in the morning for 7 days. This progress to 1/2 tab in the morning and night for 7 days. At day 15 begin taking a full tab in the morning and night and continue., Disp: 60 tablet, Rfl: 3 .  metoprolol succinate (TOPROL-XL) 25 MG 24 hr tablet, Take 1 tablet (25 mg total) by mouth daily., Disp: 90 tablet, Rfl: 3 .  nitroGLYCERIN (NITROSTAT) 0.4 MG SL tablet, Place 1 tablet (0.4 mg total) under the tongue every 5 (five) minutes as needed for chest pain., Disp: 25 tablet, Rfl: 3 .  simvastatin (ZOCOR) 80 MG tablet, TAKE 1 TABLET (80 MG TOTAL) BY MOUTH AT BEDTIME., Disp: 90 tablet, Rfl: 3 .  traMADol (ULTRAM) 50 MG tablet, Take 1 tablet (50 mg total) by mouth every 8 (eight) hours as needed., Disp: 60 tablet, Rfl: 0   Depression screen Bailey Greene 2/9 08/13/2016  Decreased Interest 0  Down, Depressed, Hopeless 0  PHQ - 2 Score 0     She has No Known Allergies.   She  has a past medical history of Anxiety; CAD (coronary artery disease); COMMON MIGRAINE (01/31/2007); Dyslipidemia;  Headache(784.0); HTN (hypertension); Hyperlipidemia; and Migraine.    She  reports that she quit smoking about 3 years ago. Her smoking use included Cigarettes. She has a 4.00 pack-year smoking history. She has never used smokeless tobacco. She reports that she does not drink alcohol or use drugs. She  reports that she currently engages in sexual activity. The patient  has a past surgical history that includes Coronary artery bypass graft (2006); Cesarean section (1984); and Bunionectomy (08/2011).  Her family history includes Diabetes in her father and paternal grandmother; Heart attack in her father; Hypertension in her father and paternal grandmother; Lupus in her mother.  Review of Systems  Constitutional: Negative for chills and fever.  Respiratory: Negative for cough.   Gastrointestinal: Negative for nausea.  Genitourinary: Negative for dysuria, frequency and urgency.  Musculoskeletal: Negative for myalgias.  Endo/Heme/Allergies: Negative for polydipsia.    The problem list and medications were reviewed and updated by myself where necessary and exist elsewhere in the encounter.   OBJECTIVE:  BP 118/80 (BP Location: Right Arm, Patient Position: Sitting, Cuff Size: Small)   Pulse 78   Temp 98.7 F (37.1 C) (Oral)   Resp 16   Ht 5\' 4"  (1.626 m)   Wt 146 lb (66.2 kg)   LMP 11/28/2009   SpO2 97%   BMI  25.06 kg/m    Physical Exam  Constitutional: She is oriented to person, place, and time. She appears well-nourished. No distress.  Eyes: EOM are normal. Pupils are equal, round, and reactive to light.  Cardiovascular: Normal rate.   Pulmonary/Chest: Effort normal.  Abdominal: She exhibits no distension.  Neurological: She is alert and oriented to person, place, and time. No cranial nerve deficit. Gait normal.  Skin: Skin is dry. She is not diaphoretic.  Psychiatric: She has a normal mood and affect.  Vitals reviewed.   Diabetic Foot Exam - Simple   Simple Foot Form Diabetic  Foot exam was performed with the following findings:  Yes 08/13/2016 10:32 AM  Visual Inspection Sensation Testing Pulse Check Comments      No results found for this or any previous visit (from the past 72 hour(s)).  No results found.  ASSESSMENT AND PLAN:  Bailey Greene was seen today for follow-up.  Diagnoses and all orders for this visit:  Uncontrolled type 2 diabetes mellitus with complication, without long-term current use of insulin (HCC): A1c down one point.  Pneumococcal 23 administered today.  Screening nephrosis.  Optho appointment pending. Will see her back in three months for a recheck.  -     Renal Function Panel -     Hepatic Function Panel -     Microalbumin, urine -     POCT glycosylated hemoglobin (Hb A1C) -     Ambulatory referral to Ophthalmology  Need for prophylactic vaccination against Streptococcus pneumoniae (pneumococcus) -     Pneumococcal polysaccharide vaccine 23-valent greater than or equal to 2yo subcutaneous/IM    The patient is advised to call or return to clinic if she does not see an improvement in symptoms, or to seek the care of the closest emergency department if she worsens with the above plan.   Deliah Boston, MHS, PA-C [facility] 08/13/2016 11:12 AM

## 2016-08-13 NOTE — Patient Instructions (Signed)
     IF you received an x-ray today, you will receive an invoice from [facility]. Please contact [facility] at [phone] with questions or concerns regarding your invoice.   IF you received labwork today, you will receive an invoice from [facility]. Please contact [facility] at [phone] with questions or concerns regarding your invoice.   Our billing staff will not be able to assist you with questions regarding bills from these companies.  You will be contacted with the lab results as soon as they are available. The fastest way to get your results is to activate your My Chart account. Instructions are located on the last page of this paperwork. If you have not heard from us regarding the results in 2 weeks, please contact this office.     

## 2016-08-14 LAB — HEPATIC FUNCTION PANEL
ALT: 16 IU/L (ref 0–32)
AST: 20 IU/L (ref 0–40)
Alkaline Phosphatase: 96 IU/L (ref 39–117)
Bilirubin Total: 0.3 mg/dL (ref 0.0–1.2)
Bilirubin, Direct: 0.11 mg/dL (ref 0.00–0.40)
Total Protein: 6.8 g/dL (ref 6.0–8.5)

## 2016-08-14 LAB — RENAL FUNCTION PANEL
Albumin: 4.6 g/dL (ref 3.5–5.5)
BUN/Creatinine Ratio: 13 (ref 9–23)
BUN: 11 mg/dL (ref 6–24)
CO2: 25 mmol/L (ref 18–29)
Calcium: 9.8 mg/dL (ref 8.7–10.2)
Chloride: 100 mmol/L (ref 96–106)
Creatinine, Ser: 0.83 mg/dL (ref 0.57–1.00)
GFR calc Af Amer: 93 mL/min/{1.73_m2} (ref >59)
GFR calc non Af Amer: 81 mL/min/{1.73_m2} (ref >59)
Glucose: 82 mg/dL (ref 65–99)
Phosphorus: 3.6 mg/dL (ref 2.5–4.5)
Potassium: 4.1 mmol/L (ref 3.5–5.2)
Sodium: 141 mmol/L (ref 134–144)

## 2016-08-14 LAB — MICROALBUMIN, URINE: Microalbumin, Urine: 12.2 ug/mL

## 2016-08-17 ENCOUNTER — Encounter: Payer: Self-pay | Admitting: Family

## 2016-08-17 ENCOUNTER — Ambulatory Visit (INDEPENDENT_AMBULATORY_CARE_PROVIDER_SITE_OTHER): Payer: 59 | Admitting: Family

## 2016-08-17 VITALS — BP 116/85 | HR 84 | Temp 98.5°F | Resp 16 | Ht 64.0 in | Wt 142.0 lb

## 2016-08-17 DIAGNOSIS — IMO0001 Reserved for inherently not codable concepts without codable children: Secondary | ICD-10-CM

## 2016-08-17 DIAGNOSIS — E1165 Type 2 diabetes mellitus with hyperglycemia: Secondary | ICD-10-CM | POA: Diagnosis not present

## 2016-08-17 DIAGNOSIS — G43809 Other migraine, not intractable, without status migrainosus: Secondary | ICD-10-CM

## 2016-08-17 DIAGNOSIS — Z1231 Encounter for screening mammogram for malignant neoplasm of breast: Secondary | ICD-10-CM

## 2016-08-17 DIAGNOSIS — E049 Nontoxic goiter, unspecified: Secondary | ICD-10-CM | POA: Diagnosis not present

## 2016-08-17 DIAGNOSIS — Z1239 Encounter for other screening for malignant neoplasm of breast: Secondary | ICD-10-CM

## 2016-08-17 DIAGNOSIS — E785 Hyperlipidemia, unspecified: Secondary | ICD-10-CM

## 2016-08-17 DIAGNOSIS — I2510 Atherosclerotic heart disease of native coronary artery without angina pectoris: Secondary | ICD-10-CM

## 2016-08-17 LAB — TSH: TSH: 0.46 u[IU]/mL (ref 0.35–4.50)

## 2016-08-17 LAB — LIPID PANEL
Cholesterol: 157 mg/dL (ref 0–200)
HDL: 60.6 mg/dL (ref >39.00)
LDL Cholesterol: 74 mg/dL (ref 0–99)
NonHDL: 96.86
Total CHOL/HDL Ratio: 3
Triglycerides: 114 mg/dL (ref 0.0–149.0)
VLDL: 22.8 mg/dL (ref 0.0–40.0)

## 2016-08-17 LAB — T3, FREE: T3, Free: 4.4 pg/mL — ABNORMAL HIGH (ref 2.3–4.2)

## 2016-08-17 LAB — T4, FREE: Free T4: 0.78 ng/dL (ref 0.60–1.60)

## 2016-08-17 MED ORDER — SITAGLIPTIN PHOSPHATE 100 MG PO TABS: 100.0000 mg | ORAL_TABLET | Freq: Every day | ORAL | 3 refills | Status: DC

## 2016-08-17 MED ORDER — ONDANSETRON HCL 4 MG PO TABS: 4.0000 mg | ORAL_TABLET | Freq: Three times a day (TID) | ORAL | 0 refills | Status: DC | PRN

## 2016-08-17 MED ORDER — SUMATRIPTAN SUCCINATE 50 MG PO TABS: ORAL_TABLET | ORAL | 2 refills | Status: DC

## 2016-08-17 NOTE — Progress Notes (Signed)
Subjective:    Patient ID: Bailey PerchesJacqueline Greene, female    DOB: 10/11/1960, 56 y.o.   MRN: [id number]  HPI  [name] is a 56 yr old female who presents today to re-establish care. Her last visit with our clinic was 12/1/14She was previously followed at urgent care.   Hx of Cluster Headaches- she has been followed by the headache wellness clinic in the past and also received botox injections which helped her.  She reports that her headaches are "excrutiating." reports that her headaches began 2-3 weeks ago. Last HA like this was in 2015.  She reports associated right sided facial drooping during the severe headache. Lasts 30-40 minutes.  Aleve/advil not helping.  Getting 3-4 headaches a day. She will call the [facility] wellness center to schedule follow up.  Reports associated nausea with her her headaches. Also has blurring in the right eye when this occurs.    CAD- s/p cabg- She is followed by Dr. Antoine PocheHochrein.  She is on a beta blocker and aspirin 81mg  once daily.  DM2- maintained on metformin.  Metformin was started back on 07/28/16.  Notes that diarrhea started when she got back on metormin, even at the 500mg  dose.  She does not have a glucometer.    Lab Results  Component Value Date   HGBA1C 7.6 08/13/2016   HGBA1C 8.6 07/28/2016   Lab Results  Component Value Date   LDLCALC 148 (H) 06/22/2012   CREATININE 0.83 08/13/2016   Hyperlipidemia- currently on statin.   Lab Results  Component Value Date   CHOL 240 (H) 06/22/2012   HDL 70 06/22/2012   LDLCALC 148 (H) 06/22/2012   LDLDIRECT 145.2 07/18/2008   TRIG 112 06/22/2012   CHOLHDL 3.4 06/22/2012          Review of Systems See HPI  Past Medical History:  Diagnosis Date  . Anxiety   . CAD (coronary artery disease)   . 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   Qualifier: Diagnosis of  By: Cheri GuppyAlleyne, Tiffany    . Dyslipidemia   . Headache(784.0)   . HTN (hypertension)   . Hyperlipidemia   . Migraine      Social History   Social History    . Marital status: Legally Separated    Spouse name: N/A  . Number of children: N/A  . Years of education: N/A   Occupational History  . Not on file.   Social History Main Topics  . Smoking status: Former Smoker    Packs/day: 0.50    Years: 8.00    Types: Cigarettes    Quit date: 12/25/2012  . Smokeless tobacco: Never Used  . Alcohol use No  . Drug use: No  . Sexual activity: Yes   Other Topics Concern  . Not on file   Social History Narrative   Regular exercise:  3 days weekly   Caffeine Use:  1 soda daily   One child biological daughter born in [id number] and an adopted niece.   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           Past Surgical History:  Procedure Laterality Date  . BUNIONECTOMY  08/2011   right foot  . CESAREAN SECTION  1984  . CORONARY ARTERY BYPASS GRAFT  2006    Coronary artery bypass grafting x5 with a left  internal  mammary to the left anterior descending coronary artery.  Free right  internal mammary to the diagonal coronary artery, sequential reverse  saphenous  vein graft to the first and second obtuse marginal, right  artery bypass to the posterior descending coronary artery with endo-vein harvesting.    Family History  Problem Relation Age of Onset  . Lupus Mother   . Heart attack Father   . Diabetes Father   . Hypertension Father   . Diabetes Paternal Grandmother   . Hypertension Paternal Grandmother   . Stroke Neg Hx   . Kidney disease Neg Hx   . Hyperlipidemia Neg Hx   . Sudden death Neg Hx     No Known Allergies  Current Outpatient Prescriptions on File Prior to Visit  Medication Sig Dispense Refill  . aspirin EC 81 MG tablet Take 81 mg by mouth daily.    . metoprolol succinate (TOPROL-XL) 25 MG 24 hr tablet Take 1 tablet (25 mg total) by mouth daily. 90 tablet 3  . nicotine (NICODERM CQ) 7 mg/24hr patch Place 1 patch (7 mg total) onto the skin daily. 28 patch 1  . nitroGLYCERIN (NITROSTAT) 0.4 MG SL  tablet Place 1 tablet (0.4 mg total) under the tongue every 5 (five) minutes as needed for chest pain. 25 tablet 3  . simvastatin (ZOCOR) 80 MG tablet TAKE 1 TABLET (80 MG TOTAL) BY MOUTH AT BEDTIME. 90 tablet 3  . traMADol (ULTRAM) 50 MG tablet Take 1 tablet (50 mg total) by mouth every 8 (eight) hours as needed. 60 tablet 0   No current facility-administered medications on file prior to visit.     BP 116/85 (BP Location: Right Arm, Patient Position: Sitting, Cuff Size: Normal)   Pulse 84   Temp 98.5 F (36.9 C) (Oral)   Resp 16   Ht 5\' 4"  (1.626 m)   Wt 142 lb (64.4 kg)   LMP 11/28/2009   SpO2 98%   BMI 24.37 kg/m       Objective:   Physical Exam  Constitutional: She is oriented to person, place, and time. She appears well-developed and well-nourished.  HENT:  Head: Normocephalic and atraumatic.  Eyes: EOM are normal.  Cardiovascular: Normal rate, regular rhythm and normal heart sounds.   No murmur heard. Pulmonary/Chest: Effort normal and breath sounds normal. No respiratory distress. She has no wheezes.  Neurological: She is alert and oriented to person, place, and time. No cranial nerve deficit. She exhibits normal muscle tone. Coordination normal.  + facial symmetry, tongue is midline.   Psychiatric: She has a normal mood and affect. Her behavior is normal. Judgment and thought content normal.          Assessment & Plan:  Migraine- uncontrolled.   Begin imitrex as needed for migraines. (max 2 tabs/24 hours). For nausea you may try zofran as needed.  Stop aleve and motrin. Schedule follow up with the headache wellness center.  DM2- not tolerating metformin due to diarrhea. D/c metformin due to diarrhea, begin [place] instead. Check sugars daily- goal fasting sugar 80-110, 140 or less after meals. Pt will be given a glucometer, teaching and strips today.   CAD- following with cardiology. Clinically stable.   Goiter- repeat thyroid US, check TFT's.

## 2016-08-17 NOTE — Patient Instructions (Signed)
Begin imitrex as needed for migraines. (max 2 tabs/24 hours). For nausea you may try zofran as needed.  Stop aleve and motrin. Schedule follow up with the headache wellness center. Stop metformin due to diarrhea, begin [place]uv[place] instead. Check sugars daily- goal fasting sugar 80-110, 140 or less after meals.

## 2016-08-17 NOTE — Progress Notes (Signed)
Pre visit review using our clinic review tool, if applicable. No additional management support is needed unless otherwise documented below in the visit not 

## 2016-08-18 ENCOUNTER — Telehealth: Payer: Self-pay | Admitting: *Deleted

## 2016-08-18 DIAGNOSIS — E049 Nontoxic goiter, unspecified: Secondary | ICD-10-CM

## 2016-08-18 MED ORDER — ACCU-CHEK FASTCLIX LANCETS MISC: 1 refills | Status: DC

## 2016-08-18 MED ORDER — GLUCOSE BLOOD VI STRP: ORAL_STRIP | 1 refills | Status: DC

## 2016-08-18 NOTE — Addendum Note (Signed)
Addended by: Mervin KungFERGERSON, Hailley Byers A on: 08/18/2016 08:10 AM   Modules accepted: Orders

## 2016-08-18 NOTE — Telephone Encounter (Signed)
[facility] [place] [facility] are unable to schedule thyroid biopsy until thyroid u/s has been done. Was this supposed to be thyroid u/s instead of biopsy?

## 2016-08-18 NOTE — Telephone Encounter (Signed)
Yes, it should have been thyroid US.  Order has been changed.

## 2016-08-31 ENCOUNTER — Ambulatory Visit (HOSPITAL_BASED_OUTPATIENT_CLINIC_OR_DEPARTMENT_OTHER): Payer: 59

## 2016-09-13 ENCOUNTER — Telehealth: Payer: Self-pay | Admitting: Family

## 2016-09-13 MED ORDER — SUMATRIPTAN SUCCINATE 50 MG PO TABS: ORAL_TABLET | ORAL | 2 refills | Status: DC

## 2016-09-13 NOTE — Telephone Encounter (Signed)
Refill sent, but lets bring her back in since migraines are not well controlled.

## 2016-09-13 NOTE — Telephone Encounter (Signed)
°  Relation to WU:JWJX Call back number:[phone] [phone] Gin[place] Otto, [place] - [address] [phone] (Phone) [phone] (Fax)     Reason for call:  Patient requesting a refill SUMAtriptan (IMITREX) 50 MG tablet

## 2016-09-13 NOTE — Telephone Encounter (Signed)
Spoke with pt and advised her that there should still be 2 refills on rx that was sent on 08/17/16. Pt states that she has already used both refills and only has about 6 tablets left. States she tries not to use them but is having to take 1-2 tablets almost every day.  Please advise?

## 2016-09-14 ENCOUNTER — Ambulatory Visit: Payer: 59 | Admitting: Family

## 2016-09-14 NOTE — Telephone Encounter (Signed)
Left detailed message on pt's voicemail re: refill authorization and to call if any further questions/concerns.

## 2016-09-14 NOTE — Telephone Encounter (Signed)
Patient scheduled for 09/27/2016 at 5:3pm with PCP.

## 2016-09-20 ENCOUNTER — Ambulatory Visit (HOSPITAL_BASED_OUTPATIENT_CLINIC_OR_DEPARTMENT_OTHER)
Admission: RE | Admit: 2016-09-20 | Discharge: 2016-09-20 | Disposition: A | Discharge status: Home / Self Care | Payer: 59 | Source: Ambulatory Visit | Attending: Family | Admitting: Family

## 2016-09-20 DIAGNOSIS — E049 Nontoxic goiter, unspecified: Secondary | ICD-10-CM

## 2016-09-20 DIAGNOSIS — E042 Nontoxic multinodular goiter: Secondary | ICD-10-CM | POA: Insufficient documentation

## 2016-09-22 ENCOUNTER — Other Ambulatory Visit: Payer: Self-pay | Admitting: Family

## 2016-09-22 DIAGNOSIS — E042 Nontoxic multinodular goiter: Secondary | ICD-10-CM

## 2016-09-27 ENCOUNTER — Encounter: Payer: Self-pay | Admitting: Family

## 2016-09-27 ENCOUNTER — Ambulatory Visit (INDEPENDENT_AMBULATORY_CARE_PROVIDER_SITE_OTHER): Payer: 59 | Admitting: Family

## 2016-09-27 VITALS — BP 120/84 | HR 103 | Temp 98.4°F | Resp 16 | Ht 64.0 in | Wt 136.4 lb

## 2016-09-27 DIAGNOSIS — E042 Nontoxic multinodular goiter: Secondary | ICD-10-CM

## 2016-09-27 DIAGNOSIS — G43909 Migraine, unspecified, not intractable, without status migrainosus: Secondary | ICD-10-CM | POA: Diagnosis not present

## 2016-09-27 DIAGNOSIS — IMO0001 Reserved for inherently not codable concepts without codable children: Secondary | ICD-10-CM

## 2016-09-27 DIAGNOSIS — E1165 Type 2 diabetes mellitus with hyperglycemia: Secondary | ICD-10-CM | POA: Diagnosis not present

## 2016-09-27 NOTE — Progress Notes (Signed)
Pre visit review using our clinic review tool, if applicable. No additional management support is needed unless otherwise documented below in the visit note. 

## 2016-09-27 NOTE — Progress Notes (Signed)
Subjective:    Patient ID: Bailey Greene, female    DOB: 08/19/1960, 56 y.o.   MRN: [id number]  HPI   Bailey Greene is a 56 yr old female who presents today for follow up.  Migraines- reports that she saw neurology at the [facility]. They gave her an rx for verapamil. She will pick up rx today.  She notes migraines are unchanged.  DM2- was placed on januvia in place of metformin. She is losing weight she believes due to anorexia from her uncontrolled migraines.  Lab Results  Component Value Date   HGBA1C 7.6 08/13/2016   Goiter- notes some tenderness on the right side of her thyroid.       Review of Systems    see HPI  Past Medical History:  Diagnosis Date  . Anxiety   . CAD (coronary artery disease)   . 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   Qualifier: Diagnosis of  By: Cheri Guppy    . Dyslipidemia   . Headache(784.0)   . HTN (hypertension)   . Hyperlipidemia   . Migraine      Social History   Social History  . Marital status: Legally Separated    Spouse name: N/A  . Number of children: N/A  . Years of education: N/A   Occupational History  . Not on file.   Social History Main Topics  . Smoking status: Former Smoker    Packs/day: 0.50    Years: 8.00    Types: Cigarettes    Quit date: 12/25/2012  . Smokeless tobacco: Never Used  . Alcohol use No  . Drug use: No  . Sexual activity: Yes   Other Topics Concern  . Not on file   Social History Narrative   Regular exercise:  3 days weekly   Caffeine Use:  1 soda daily   One child biological daughter born in 10 and an adopted niece.   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           Past Surgical History:  Procedure Laterality Date  . BUNIONECTOMY  08/2011   right foot  . CESAREAN SECTION  1984  . CORONARY ARTERY BYPASS GRAFT  2006    Coronary artery bypass grafting x5 with a left  internal  mammary to the left anterior descending coronary artery.  Free right   internal mammary to the diagonal coronary artery, sequential reverse  saphenous vein graft to the first and second obtuse marginal, right  artery bypass to the posterior descending coronary artery with endo-vein harvesting.    Family History  Problem Relation Age of Onset  . Lupus Mother   . Heart attack Father   . Diabetes Father   . Hypertension Father   . Diabetes Paternal Grandmother   . Hypertension Paternal Grandmother   . Stroke Neg Hx   . Kidney disease Neg Hx   . Hyperlipidemia Neg Hx   . Sudden death Neg Hx     No Known Allergies  Current Outpatient Prescriptions on File Prior to Visit  Medication Sig Dispense Refill  . ACCU-CHEK FASTCLIX LANCETS MISC Use to check blood sugar once a day.  DX  E11.65 100 each 1  . aspirin EC 81 MG tablet Take 81 mg by mouth daily.    Marland Kitchen glucose blood (ACCU-CHEK GUIDE) test strip Use to check blood sugar once a day.  DX  E11.65 100 each 1  . metoprolol succinate (TOPROL-XL) 25 MG 24 hr tablet  Take 1 tablet (25 mg total) by mouth daily. 90 tablet 3  . nicotine (NICODERM CQ) 7 mg/24hr patch Place 1 patch (7 mg total) onto the skin daily. 28 patch 1  . nitroGLYCERIN (NITROSTAT) 0.4 MG SL tablet Place 1 tablet (0.4 mg total) under the tongue every 5 (five) minutes as needed for chest pain. 25 tablet 3  . ondansetron (ZOFRAN) 4 MG tablet Take 1 tablet (4 mg total) by mouth every 8 (eight) hours as needed for nausea or vomiting. 20 tablet 0  . simvastatin (ZOCOR) 80 MG tablet TAKE 1 TABLET (80 MG TOTAL) BY MOUTH AT BEDTIME. 90 tablet 3  . sitaGLIPtin (JANUVIA) 100 MG tablet Take 1 tablet (100 mg total) by mouth daily. 30 tablet 3  . SUMAtriptan (IMITREX) 50 MG tablet 1 tablet by mouth at start of migraine, may repeat in 2 hours (max 2 tabs/24) 10 tablet 2  . traMADol (ULTRAM) 50 MG tablet Take 1 tablet (50 mg total) by mouth every 8 (eight) hours as needed. 60 tablet 0   No current facility-administered medications on file prior to visit.     BP  120/84 (BP Location: Right Arm, Cuff Size: Normal)   Pulse (!) 103   Temp 98.4 F (36.9 C) (Oral)   Resp 16   Ht  (1.626 m)   Wt 136 lb 6.4 oz (61.9 kg)   LMP 11/28/2009   SpO2 100% Comment: room air  BMI 23.41 kg/m    Objective:   Physical Exam  Constitutional: She appears well-developed and well-nourished.  Neck: Thyromegaly present.  Right thyroid lobe more prominent than left.  Cardiovascular: Normal rate, regular rhythm and normal heart sounds.   No murmur heard. Pulmonary/Chest: Effort normal and breath sounds normal. No respiratory distress. She has no wheezes.  Psychiatric: She has a normal mood and affect. Her behavior is normal. Judgment and thought content normal.          Assessment & Plan:  Goiter- with weight loss, mild elevation of T4- will refer to endocrinology.  Migraines- uncontrolled. Advised her to start verapamil and follow up as scheduled with neuro.   DM2- Will see how her follow up A1C is on [place] in June. Continue same.  Lab Results  Component Value Date   HGBA1C 7.6 08/13/2016   HGBA1C 8.6 07/28/2016   Lab Results  Component Value Date   LDLCALC 74 08/17/2016   CREATININE 0.83 08/13/2016

## 2016-09-27 NOTE — Patient Instructions (Signed)
Please complete lab work prior to leaving. You will be contacted about your appointment with endocrinology for your thyroid.

## 2016-09-28 ENCOUNTER — Encounter: Payer: Self-pay | Admitting: Family

## 2016-09-28 LAB — T3, FREE: T3, Free: 3.4 pg/mL (ref 2.3–4.2)

## 2016-09-28 LAB — T4, FREE: Free T4: 0.86 ng/dL (ref 0.60–1.60)

## 2016-09-28 LAB — TSH: TSH: 0.4 u[IU]/mL (ref 0.35–4.50)

## 2016-10-11 ENCOUNTER — Telehealth: Payer: Self-pay | Admitting: *Deleted

## 2016-10-11 NOTE — Telephone Encounter (Signed)
[facility] "Response Requested: Alternative Requested", Januvia 100 mg, with "Pharmacy Comments: Alternative requested: Insurance requires 90-day supply" Last filled by MD on 08/17/16, #30x3 Last AEX - 09/27/16  Phoned pharmacy to see if medication needed PA and//or just a 90-day supply for Insurance compliance, was informed that "not sure, just send the PA and see what response you get" by [facility]. PA Initiated via CMM, Key [id number], awaiting response/SLS 05/14

## 2016-10-12 MED ORDER — SITAGLIPTIN PHOSPHATE 100 MG PO TABS
100.0000 mg | ORAL_TABLET | Freq: Every day | ORAL | 1 refills | Status: DC
Start: 2016-10-12 — End: 2017-04-21

## 2016-10-12 NOTE — Telephone Encounter (Signed)
[facility] that insurance requires 90 day supply and requesting 90 day Rx. Refill sent.

## 2016-10-12 NOTE — Telephone Encounter (Signed)
Completed update questions on PA via CMM, awaiting response/SLS 05/15

## 2016-10-12 NOTE — Addendum Note (Signed)
Addended by: Mervin KungFERGERSON, Musa Rewerts A on: 10/12/2016 04:15 PM   Modules accepted: Orders

## 2016-10-29 ENCOUNTER — Encounter: Payer: Self-pay | Admitting: Endocrinology

## 2016-11-15 ENCOUNTER — Encounter: Payer: 59 | Admitting: Physician Assistant

## 2016-11-20 ENCOUNTER — Other Ambulatory Visit: Payer: Self-pay | Admitting: Family Medicine

## 2016-11-20 DIAGNOSIS — M5010 Cervical disc disorder with radiculopathy, unspecified cervical region: Secondary | ICD-10-CM

## 2016-11-22 ENCOUNTER — Telehealth: Payer: Self-pay

## 2016-11-22 NOTE — Telephone Encounter (Signed)
F[facility]ed tramadol [facility] mg [facility] per [phone] Called pt left v/m that her medication has been faxed to her pharmacy.

## 2016-12-10 ENCOUNTER — Other Ambulatory Visit: Payer: Self-pay | Admitting: Cardiology

## 2016-12-21 ENCOUNTER — Other Ambulatory Visit: Payer: 59

## 2017-04-21 ENCOUNTER — Other Ambulatory Visit: Payer: Self-pay | Admitting: Family

## 2017-04-24 NOTE — Telephone Encounter (Signed)
Needs OV prior to additional refills.  

## 2017-04-26 NOTE — Telephone Encounter (Signed)
Left message at pt's phone number for her to call back ASAP to set up appointment. Once she is scheduled we can give her a refill.

## 2017-04-29 NOTE — Telephone Encounter (Signed)
Patient is now scheduled for f/up on 05-02-17. Melissa sent rx on 04-24-17 for a 30 day supply of the Januvia.

## 2017-05-02 ENCOUNTER — Ambulatory Visit: Payer: 59 | Admitting: Family

## 2017-05-02 ENCOUNTER — Telehealth: Payer: Self-pay | Admitting: *Deleted

## 2017-05-02 NOTE — Telephone Encounter (Signed)
Noted, no charge.  

## 2017-05-02 NOTE — Telephone Encounter (Signed)
Bailey BradfordKimberly-- I wasn't sure who to send this to about no charge for late cancellation?

## 2017-05-02 NOTE — Telephone Encounter (Signed)
Melissa-- please advise charge or no charge for late cancellation?  Copied from CRM #[id number]. Topic: Quick Communication - Appointment Cancellation >> May 02, 2017  7:36 AM Crist InfanteHarrald, Kathy J wrote: Patient called to cancel appointment scheduled for today this am at 8:20. Patient {HAS NOT rescheduled their appointment.  States she could not get off work.  Will call back to reschedule. Pt states she called Sunday to cancel and was told to call back this am.  Route to department's PEC pool.

## 2017-06-29 NOTE — Progress Notes (Signed)
HPI The patient presents for followup of a history of coronary artery disease status post CABG x5 in 2006 followup cath in 2007 showed patent grafts and improved LV function EF 65%. She had a normal stress test in May 2012 and again in July 2016 and again in March of 2018.  She continues to get arm and some chest discomfort.  She thinks this is worse than it was last year but similar to the previous discomfort when she had a negative treadmill test.  She gets this at work with emotional stress.  She might have it somewhat at home.  However, she does not get it when she is exercising on the treadmill.  She is able to do that without bringing on any discomfort.  She has no shortness of breath, PND or orthopnea.  She has no palpita   No Known Allergies  Current Outpatient Medications  Medication Sig Dispense Refill  . glucose blood (ACCU-CHEK GUIDE) test strip Use to check blood sugar once a day.  DX  E11.65 100 each 1  . metoprolol succinate (TOPROL-XL) 25 MG 24 hr tablet Take 1 tablet (25 mg total) by mouth daily. 90 tablet 3  . simvastatin (ZOCOR) 80 MG tablet TAKE 1 TABLET (80 MG TOTAL) BY MOUTH AT BEDTIME. 90 tablet 3   No current facility-administered medications for this visit.     Past Medical History:  Diagnosis Date  . Anxiety   . CAD (coronary artery disease)   . 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   Qualifier: Diagnosis of  By: Cheri Guppy    . Dyslipidemia   . Headache(784.0)   . HTN (hypertension)   . Hyperlipidemia   . Migraine     Past Surgical History:  Procedure Laterality Date  . BUNIONECTOMY  08/2011   right foot  . CESAREAN SECTION  1984  . CORONARY ARTERY BYPASS GRAFT  2006    Coronary artery bypass grafting x5 with a left  internal  mammary to the left anterior descending coronary artery.  Free right  internal mammary to the diagonal coronary artery, sequential reverse  saphenous vein graft to the first and second obtuse marginal, right  artery bypass to the  posterior descending coronary artery with endo-vein harvesting.    ROS:   As stated in the HPI and negative for all other systems.  PHYSICAL EXAM BP 133/81   Pulse 71   Ht 5\' 4"  (1.626 m)   Wt 141 lb 6.4 oz (64.1 kg)   LMP 11/28/2009   BMI 24.27 kg/m   GENERAL:  Well appearing NECK:  No jugular venous distention, waveform within normal limits, carotid upstroke brisk and symmetric, no bruits, no thyromegaly LUNGS:  Clear to auscultation bilaterally CHEST:  Unremarkable HEART:  PMI not displaced or sustained,S1 and S2 within normal limits, no S3, no S4, no clicks, no rubs, no murmurs ABD:  Flat, positive bowel sounds normal in frequency in pitch, no bruits, no rebound, no guarding, no midline pulsatile mass, no hepatomegaly, no splenomegaly EXT:  2 plus pulses upper and decreased DP/PT bilateral, no edema, no cyanosis no clubbing   EKG:  Sinus rhythm, rate 71, axis within normal limits, poor anterior R wave progression, no acute ST-T wave changes.   ASSESSMENT AND PLAN   CAD:   The patient has some chest discomfort which has been a chronic complaint over time and somewhat difficult to sort out.  She has had multiple negative stress tests in the past.  At this point  I would think this is less likely to be angina rather than her stress or other etiology and will start with a POET (Plain Old Exercise Treadmill).  I will bring her back however for follow up after this if it is negative to see if symptoms are progressing.  If it is abnormal she will need a cardiac cath.   DYSLIPIDEMIA:   She will need a lipid profile when she returns for the POET (Plain Old Exercise Treadmill)  HTN:   The blood pressure is at target. No change in medications is indicated. We will continue with therapeutic lifestyle changes (TLC).

## 2017-06-30 ENCOUNTER — Ambulatory Visit: Payer: 59 | Admitting: Cardiology

## 2017-06-30 ENCOUNTER — Encounter: Payer: Self-pay | Admitting: Cardiology

## 2017-06-30 VITALS — BP 133/81 | HR 71 | Ht 64.0 in | Wt 141.4 lb

## 2017-06-30 DIAGNOSIS — I25119 Atherosclerotic heart disease of native coronary artery with unspecified angina pectoris: Secondary | ICD-10-CM | POA: Diagnosis not present

## 2017-06-30 DIAGNOSIS — R079 Chest pain, unspecified: Secondary | ICD-10-CM

## 2017-06-30 DIAGNOSIS — R0602 Shortness of breath: Secondary | ICD-10-CM

## 2017-06-30 NOTE — Patient Instructions (Signed)
Medication Instructions:  Continue current medications  If you need a refill on your cardiac medications before your next appointment, please call your pharmacy.  Labwork: None Ordered   Testing/Procedures: Your physician has requested that you have an exercise tolerance test. For further information please visit [email or web address]. Please also follow instruction sheet, as given.   Follow-Up: Your physician wants you to follow-up in: 6 Weeks with PA.    [facility] you [facility]!!

## 2017-07-01 NOTE — Addendum Note (Signed)
Addended by: Barrie DunkerHOMAS, NYASHA N on: 07/01/2017 08:21 AM   Modules accepted: Orders

## 2017-07-08 ENCOUNTER — Other Ambulatory Visit: Payer: Self-pay | Admitting: Cardiology

## 2017-07-08 ENCOUNTER — Telehealth (HOSPITAL_COMMUNITY): Payer: Self-pay

## 2017-07-08 ENCOUNTER — Ambulatory Visit: Payer: 59 | Admitting: Cardiology

## 2017-07-08 NOTE — Telephone Encounter (Signed)
REFILL 

## 2017-07-08 NOTE — Telephone Encounter (Signed)
Encounter complete. 

## 2017-07-13 ENCOUNTER — Telehealth: Payer: Self-pay | Admitting: *Deleted

## 2017-07-13 ENCOUNTER — Ambulatory Visit (HOSPITAL_COMMUNITY): Admission: RE | Admit: 2017-07-13 | Payer: 59 | Source: Ambulatory Visit | Attending: Cardiology | Admitting: Cardiology

## 2017-07-13 NOTE — Telephone Encounter (Signed)
-----   Message from Rollene RotundaJames Hochrein, MD sent at 06/30/2017  5:07 PM EST ----- Needs fasting lipid and liver at time of POET (Plain Old Exercise Treadmill)

## 2017-07-13 NOTE — Telephone Encounter (Signed)
Called and spoke with pt about her GXT, pt stated she had to cancel it because she could get the day off from work, pt stated she will look at her schedule and called beck to reschedule GXT, advised pt to get GXT in the morning because she will also need blood work fasting, pt vocie understanding.

## 2017-07-18 ENCOUNTER — Encounter (HOSPITAL_COMMUNITY): Payer: Self-pay

## 2017-07-18 ENCOUNTER — Emergency Department (HOSPITAL_COMMUNITY): Payer: 59

## 2017-07-18 ENCOUNTER — Other Ambulatory Visit: Payer: Self-pay

## 2017-07-18 ENCOUNTER — Observation Stay (HOSPITAL_COMMUNITY)
Admission: EM | Admit: 2017-07-18 | Discharge: 2017-07-19 | Disposition: A | Discharge status: Home / Self Care | Payer: 59 | Attending: Family Medicine | Admitting: Family Medicine

## 2017-07-18 DIAGNOSIS — E785 Hyperlipidemia, unspecified: Secondary | ICD-10-CM | POA: Insufficient documentation

## 2017-07-18 DIAGNOSIS — I10 Essential (primary) hypertension: Secondary | ICD-10-CM | POA: Insufficient documentation

## 2017-07-18 DIAGNOSIS — Z79899 Other long term (current) drug therapy: Secondary | ICD-10-CM | POA: Diagnosis not present

## 2017-07-18 DIAGNOSIS — Z7984 Long term (current) use of oral hypoglycemic drugs: Secondary | ICD-10-CM | POA: Insufficient documentation

## 2017-07-18 DIAGNOSIS — Z7982 Long term (current) use of aspirin: Secondary | ICD-10-CM | POA: Diagnosis not present

## 2017-07-18 DIAGNOSIS — R739 Hyperglycemia, unspecified: Secondary | ICD-10-CM

## 2017-07-18 DIAGNOSIS — Z87891 Personal history of nicotine dependence: Secondary | ICD-10-CM | POA: Diagnosis not present

## 2017-07-18 DIAGNOSIS — F419 Anxiety disorder, unspecified: Secondary | ICD-10-CM | POA: Insufficient documentation

## 2017-07-18 DIAGNOSIS — R110 Nausea: Secondary | ICD-10-CM | POA: Insufficient documentation

## 2017-07-18 DIAGNOSIS — E1165 Type 2 diabetes mellitus with hyperglycemia: Secondary | ICD-10-CM | POA: Diagnosis not present

## 2017-07-18 DIAGNOSIS — Z9889 Other specified postprocedural states: Secondary | ICD-10-CM | POA: Diagnosis not present

## 2017-07-18 DIAGNOSIS — R0602 Shortness of breath: Secondary | ICD-10-CM | POA: Insufficient documentation

## 2017-07-18 DIAGNOSIS — Z8249 Family history of ischemic heart disease and other diseases of the circulatory system: Secondary | ICD-10-CM | POA: Insufficient documentation

## 2017-07-18 DIAGNOSIS — R202 Paresthesia of skin: Secondary | ICD-10-CM | POA: Insufficient documentation

## 2017-07-18 DIAGNOSIS — Z951 Presence of aortocoronary bypass graft: Secondary | ICD-10-CM | POA: Insufficient documentation

## 2017-07-18 DIAGNOSIS — I2510 Atherosclerotic heart disease of native coronary artery without angina pectoris: Secondary | ICD-10-CM | POA: Insufficient documentation

## 2017-07-18 DIAGNOSIS — R072 Precordial pain: Secondary | ICD-10-CM

## 2017-07-18 DIAGNOSIS — R079 Chest pain, unspecified: Secondary | ICD-10-CM | POA: Diagnosis not present

## 2017-07-18 DIAGNOSIS — R0789 Other chest pain: Secondary | ICD-10-CM | POA: Diagnosis not present

## 2017-07-18 HISTORY — DX: Type 2 diabetes mellitus without complications: E11.9

## 2017-07-18 LAB — I-STAT TROPONIN, ED
Troponin i, poc: 0 ng/mL (ref 0.00–0.08)
Troponin i, poc: 0 ng/mL (ref 0.00–0.08)

## 2017-07-18 LAB — CBC
HCT: 41 % (ref 36.0–46.0)
Hemoglobin: 14 g/dL (ref 12.0–15.0)
MCH: 30.3 pg (ref 26.0–34.0)
MCHC: 34.1 g/dL (ref 30.0–36.0)
MCV: 88.7 fL (ref 78.0–100.0)
Platelets: 193 10*3/uL (ref 150–400)
RBC: 4.62 MIL/uL (ref 3.87–5.11)
RDW: 14.2 % (ref 11.5–15.5)
WBC: 7.1 10*3/uL (ref 4.0–10.5)

## 2017-07-18 LAB — BASIC METABOLIC PANEL
Anion gap: 11 (ref 5–15)
BUN: 6 mg/dL (ref 6–20)
CO2: 23 mmol/L (ref 22–32)
Calcium: 9.9 mg/dL (ref 8.9–10.3)
Chloride: 106 mmol/L (ref 101–111)
Creatinine, Ser: 0.7 mg/dL (ref 0.44–1.00)
GFR calc Af Amer: >60 mL/min (ref >60)
GFR calc non Af Amer: >60 mL/min (ref >60)
Glucose, Bld: 189 mg/dL — ABNORMAL HIGH (ref 65–99)
Potassium: 4 mmol/L (ref 3.5–5.1)
Sodium: 140 mmol/L (ref 135–145)

## 2017-07-18 LAB — I-STAT BETA HCG BLOOD, ED (MC, WL, AP ONLY): I-stat hCG, quantitative: <5 m[IU]/mL (ref <5)

## 2017-07-18 LAB — HEMOGLOBIN A1C
Hgb A1c MFr Bld: 7.8 % — ABNORMAL HIGH (ref 4.8–5.6)
Mean Plasma Glucose: 177.16 mg/dL

## 2017-07-18 MED ORDER — ASPIRIN 81 MG PO CHEW
81.0000 mg | CHEWABLE_TABLET | Freq: Every day | ORAL | Status: DC
  Filled 2017-07-18: qty 1

## 2017-07-18 MED ORDER — ATORVASTATIN CALCIUM 40 MG PO TABS: 40.0000 mg | ORAL_TABLET | Freq: Every day | ORAL | Status: DC

## 2017-07-18 MED ORDER — SODIUM CHLORIDE 0.9 % IV SOLN
INTRAVENOUS | Status: AC
  Administered 2017-07-19: 02:00:00 via INTRAVENOUS

## 2017-07-18 MED ORDER — METOPROLOL SUCCINATE ER 25 MG PO TB24
25.0000 mg | ORAL_TABLET | Freq: Every day | ORAL | Status: DC
  Administered 2017-07-19: 25 mg via ORAL
  Filled 2017-07-18: qty 1

## 2017-07-18 MED ORDER — ASPIRIN 81 MG PO CHEW
324.0000 mg | CHEWABLE_TABLET | Freq: Once | ORAL | Status: AC
  Administered 2017-07-18: 324 mg via ORAL
  Filled 2017-07-18: qty 4

## 2017-07-18 MED ORDER — ACETAMINOPHEN 325 MG PO TABS: 650.0000 mg | ORAL_TABLET | Freq: Four times a day (QID) | ORAL | Status: DC | PRN

## 2017-07-18 MED ORDER — ENOXAPARIN SODIUM 40 MG/0.4ML ~~LOC~~ SOLN: 40.0000 mg | Freq: Every day | SUBCUTANEOUS | Status: DC

## 2017-07-18 MED ORDER — ACETAMINOPHEN 650 MG RE SUPP: 650.0000 mg | Freq: Four times a day (QID) | RECTAL | Status: DC | PRN

## 2017-07-18 NOTE — ED Triage Notes (Signed)
Pt presents to the ed with complaints of chest pain x 4 days radiating into her left arm. Endorses shortness of breath and nausea as well, NAD in triage.

## 2017-07-18 NOTE — H&P (Signed)
TRH H&P   Patient Demographics:    Bailey Greene, is a 57 y.o. female  MRN: [id number]   DOB - 1961-03-26  Admit Date - 07/18/2017  Outpatient Primary MD for the patient is Sandford Craze, NP  Referring MD/NP/PA: Loreen Freud  Outpatient Specialists:  Rollene Rotunda  Patient coming from: home  Chief Complaint  Patient presents with  . Chest Pain      HPI:    Bailey Greene  is a 57 y.o. female, w hypertension, hyperlipidemia, CAD s/p CABG, apparently c/o chest pressure for the past 4 days.  Slightly worse today and therefore presented to ED.  Slight dyspnea with exertion.  Pt denies fever, chills, cough, palp, n/v, abd pain, diarrhea, brbpr.  Pt states no recent stress testing. Pt will be admitted for w/up of chest pain.   In ED,  CXR IMPRESSION: No active cardiopulmonary disease.  EKG nsr at 95, nl axis, no st-t changes c/w ischemia.   Trop 0.00  Wbc 7.1, Hgb 14.0, Plt 193 Na 140, K 4.0, Bun 6, Creatinine 0.70  A1c=7.8  Pt will be admitted for chest pain.     Review of systems:    In addition to the HPI above,    No Fever-chills, No Headache, No changes with Vision or hearing, No problems swallowing food or Liquids, No Cough No Abdominal pain, No Nausea or Vommitting, Bowel movements are regular, No Blood in stool or Urine, No dysuria, No new skin rashes or bruises, No new joints pains-aches,  No new weakness, tingling, numbness in any extremity, No recent weight gain or loss, No polyuria, polydypsia or polyphagia, No significant Mental Stressors.  A full 10 point Review of Systems was done, except as stated above, all other Review of Systems were negative.   With Past History of the following :    Past Medical History:  Diagnosis Date  . Anxiety   . CAD (coronary artery disease)   . 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   Qualifier:  Diagnosis of  By: Cheri Guppy    . Dyslipidemia   . Headache(784.0)   . HTN (hypertension)   . Hyperlipidemia   . Migraine       Past Surgical History:  Procedure Laterality Date  . BUNIONECTOMY  08/2011   right foot  . CESAREAN SECTION  1984  . CORONARY ARTERY BYPASS GRAFT  2006    Coronary artery bypass grafting x5 with a left  internal  mammary to the left anterior descending coronary artery.  Free right  internal mammary to the diagonal coronary artery, sequential reverse  saphenous vein graft to the first and second obtuse marginal, right  artery bypass to the posterior descending coronary artery with endo-vein harvesting.      Social History:     Social History   Tobacco Use  . Smoking status: Former Smoker    Packs/day: 0.50  Years: 8.00    Pack years: 4.00    Types: Cigarettes    Last attempt to quit: 12/25/2012    Years since quitting: 4.5  . Smokeless tobacco: Never Used  Substance Use Topics  . Alcohol use: No    Alcohol/week: 0.0 oz     Lives - at home  Mobility - walks by self   Family History :     Family History  Problem Relation Age of Onset  . Lupus Mother   . Heart attack Father   . Diabetes Father   . Hypertension Father   . Diabetes Paternal Grandmother   . Hypertension Paternal Grandmother   . Stroke Neg Hx   . Kidney disease Neg Hx   . Hyperlipidemia Neg Hx   . Sudden death Neg Hx       Home Medications:   Prior to Admission medications   Medication Sig Start Date End Date Taking? Authorizing Provider  aspirin 81 MG chewable tablet Chew 81 mg by mouth daily.   Yes [provider]  glucose blood (ACCU-CHEK GUIDE) test strip Use to check blood sugar once a day.  DX  E11.65 08/18/16  Yes Sandford Craze, NP  metoprolol succinate (TOPROL-XL) 25 MG 24 hr tablet TAKE 1 TABLET (25 MG TOTAL) BY MOUTH DAILY. 07/08/17  Yes Rollene Rotunda, MD  simvastatin (ZOCOR) 80 MG tablet TAKE 1 TABLET (80 MG TOTAL) BY MOUTH AT  BEDTIME. Patient taking differently: 80mg  by mouth every night at bedtime 12/10/16  Yes Rollene Rotunda, MD     Allergies:    No Known Allergies   Physical Exam:   Vitals  Blood pressure 108/76, pulse 89, temperature 98 F (36.7 C), temperature source Oral, resp. rate 14, weight 64 kg (141 lb), last menstrual period 11/28/2009, SpO2 98 %.   1. General  lying in bed in NAD,    2. Normal affect and insight, Not Suicidal or Homicidal, Awake Alert, Oriented X 3.  3. No F.N deficits, ALL C.Nerves Intact, Strength 5/5 all 4 extremities, Sensation intact all 4 extremities, Plantars down going.  4. Ears and Eyes appear Normal, Conjunctivae clear, PERRLA. Moist Oral Mucosa.  5. Supple Neck, No JVD, No cervical lymphadenopathy appriciated, No Carotid Bruits.  6. Symmetrical Chest wall movement, Good air movement bilaterally, CTAB.  7. RRR, No Gallops, Rubs or Murmurs, No Parasternal Heave.  8. Positive Bowel Sounds, Abdomen Soft, No tenderness, No organomegaly appriciated,No rebound -guarding or rigidity.  9.  No Cyanosis, Normal Skin Turgor, No Skin Rash or Bruise.  10. Good muscle tone,  joints appear normal , no effusions, Normal ROM.  11. No Palpable Lymph Nodes in Neck or Axillae     Data Review:    CBC Recent Labs  Lab 07/18/17 1347  WBC 7.1  HGB 14.0  HCT 41.0  PLT 193  MCV 88.7  MCH 30.3  MCHC 34.1  RDW 14.2   ------------------------------------------------------------------------------------------------------------------  Chemistries  Recent Labs  Lab 07/18/17 1347  NA 140  K 4.0  CL 106  CO2 23  GLUCOSE 189*  BUN 6  CREATININE 0.70  CALCIUM 9.9   ------------------------------------------------------------------------------------------------------------------ estimated creatinine clearance is 69.4 mL/min (by C-G formula based on SCr of 0.7  mg/dL). ------------------------------------------------------------------------------------------------------------------ No results for input(s): TSH, T4TOTAL, T3FREE, THYROIDAB in the last 72 hours.  Invalid input(s): FREET3  Coagulation profile No results for input(s): INR, PROTIME in the last 168 hours. ------------------------------------------------------------------------------------------------------------------- No results for input(s): DDIMER in the last 72 hours. -------------------------------------------------------------------------------------------------------------------  Cardiac  Enzymes No results for input(s): CKMB, TROPONINI, MYOGLOBIN in the last 168 hours.  Invalid input(s): CK ------------------------------------------------------------------------------------------------------------------ No results found for: BNP   ---------------------------------------------------------------------------------------------------------------  Urinalysis    Component Value Date/Time   COLORURINE YELLOW 06/22/2012 0840   APPEARANCEUR CLEAR 06/22/2012 0840   LABSPEC 1.014 06/22/2012 0840   PHURINE 6.0 06/22/2012 0840   GLUCOSEU NEG 06/22/2012 0840   HGBUR NEG 06/22/2012 0840   BILIRUBINUR NEG 06/22/2012 0840   KETONESUR NEG 06/22/2012 0840   PROTEINUR NEG 06/22/2012 0840   UROBILINOGEN 0.2 06/22/2012 0840   NITRITE NEG 06/22/2012 0840   LEUKOCYTESUR NEG 06/22/2012 0840    ----------------------------------------------------------------------------------------------------------------   Imaging Results:    Dg Chest 2 View  Result Date: 07/18/2017 CLINICAL DATA:  Chest pain for the past 4 days with shortness of breath. EXAM: CHEST  2 VIEW COMPARISON:  Chest x-ray dated July 11, 2004. FINDINGS: The heart size and mediastinal contours are within normal limits. Prior CABG. Normal pulmonary vascularity. No focal consolidation, pleural effusion, or pneumothorax. No  acute osseous abnormality. IMPRESSION: No active cardiopulmonary disease. Electronically Signed   By: Obie DredgeWilliam T Derry M.D.   On: 07/18/2017 15:48       Assessment & Plan:    Principal Problem:   Chest pain Active Problems:   Hyperglycemia   Chest pain Tele Trop I q6h x3 Check cardiac echo NPO after [facility] consult, defer to cardiology regarding stress testing vs catheterization  Hyperglycemia, diabetes Start on Metformin 500mg  po qday FSBS Q4h, ISS  CAD s/p CABG Cont aspirin, metoprolol, simvastatin  => lipitor    DVT Prophylaxis  Lovenox - SCDs   AM Labs Ordered, also please review Full Orders  Family Communication: Admission, patients condition and plan of care including tests being ordered have been discussed with the patient  who indicate understanding and agree with the plan and Code Status.  Code Status FULL CODE  Likely DC to  home  Condition GUARDED    Consults called: cardiology by email  Admission status: observation  Time spent in minutes : 45    Pearson GrippeJames Yeray Tomas M.D on 07/18/2017 at 9:54 PM  Between 7am to 7pm - Pager - [phone]   After 7pm go to [email or web address] - password [email or web address]  [facility] - Office  [phone]

## 2017-07-18 NOTE — ED Provider Notes (Signed)
[facility] EMERGENCY DEPARTMENT Provider Note   CSN: [id number] Arrival date & time: 07/18/17  1339     History   Chief Complaint Chief Complaint  Patient presents with  . Chest Pain    HPI Bailey Greene is a 57 y.o. female.  HPI Patient with roughly 4 days of episodic left-sided upper chest pain radiating into her left arm.  Describes tingling sensation in her arm.  Has some mild shortness of breath and nausea associated with this as well.  Currently the patient is pain-free.  Last had symptoms earlier today prompting her visit to the emergency department.  Denies any new lower extremity swelling or pain.  No abdominal pain.  No fever or chills. Past Medical History:  Diagnosis Date  . Anxiety   . CAD (coronary artery disease)   . 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   Qualifier: Diagnosis of  By: Cheri Guppy    . Dyslipidemia   . Headache(784.0)   . HTN (hypertension)   . Hyperlipidemia   . Migraine     Patient Active Problem List   Diagnosis Date Noted  . Chest pain 12/27/2014  . 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 Carpal tunnel syndrome 05/03/2013  . Loss of weight 08/23/2012  . General medical examination 07/06/2011  . Goiter 09/23/2010  . Dyslipidemia 01/31/2007  . Anxiety state 01/31/2007  . 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  . Essential hypertension 01/31/2007  . Coronary atherosclerosis 01/31/2007    Past Surgical History:  Procedure Laterality Date  . BUNIONECTOMY  08/2011   right foot  . CESAREAN SECTION  1984  . CORONARY ARTERY BYPASS GRAFT  2006    Coronary artery bypass grafting x5 with a left  internal  mammary to the left anterior descending coronary artery.  Free right  internal mammary to the diagonal coronary artery, sequential reverse  saphenous vein graft to the first and second obtuse marginal, right  artery bypass to the posterior descending coronary artery with endo-vein harvesting.    OB History    No data available       Home  Medications    Prior to Admission medications   Medication Sig Start Date End Date Taking? Authorizing Provider  aspirin 81 MG chewable tablet Chew 81 mg by mouth daily.   Yes [provider]  glucose blood (ACCU-CHEK GUIDE) test strip Use to check blood sugar once a day.  DX  E11.65 08/18/16  Yes Sandford Craze, NP  metoprolol succinate (TOPROL-XL) 25 MG 24 hr tablet TAKE 1 TABLET (25 MG TOTAL) BY MOUTH DAILY. 07/08/17  Yes Rollene Rotunda, MD  simvastatin (ZOCOR) 80 MG tablet TAKE 1 TABLET (80 MG TOTAL) BY MOUTH AT BEDTIME. Patient taking differently: 80mg  by mouth every night at bedtime 12/10/16  Yes Rollene Rotunda, MD    Family History Family History  Problem Relation Age of Onset  . Lupus Mother   . Heart attack Father   . Diabetes Father   . Hypertension Father   . Diabetes Paternal Grandmother   . Hypertension Paternal Grandmother   . Stroke Neg Hx   . Kidney disease Neg Hx   . Hyperlipidemia Neg Hx   . Sudden death Neg Hx     Social History Social History   Tobacco Use  . Smoking status: Former Smoker    Packs/day: 0.50    Years: 8.00    Pack years: 4.00    Types: Cigarettes    Last attempt to quit: 12/25/2012    Years  since quitting: 4.5  . Smokeless tobacco: Never Used  Substance Use Topics  . Alcohol use: No    Alcohol/week: 0.0 oz  . Drug use: No     Allergies   Patient has no known allergies.   Review of Systems Review of Systems  Constitutional: Negative for chills and fever.  HENT: Negative for trouble swallowing.   Eyes: Negative for visual disturbance.  Respiratory: Positive for shortness of breath. Negative for cough.   Cardiovascular: Positive for chest pain. Negative for palpitations and leg swelling.  Gastrointestinal: Positive for nausea. Negative for abdominal pain, constipation, diarrhea and vomiting.  Musculoskeletal: Positive for myalgias. Negative for back pain, neck pain and neck stiffness.  Skin: Negative for rash and  wound.  Neurological: Positive for light-headedness and numbness. Negative for dizziness, tremors, weakness and headaches.  All other systems reviewed and are negative.    Physical Exam Updated Vital Signs BP 108/76   Pulse 89   Temp 98 F (36.7 C) (Oral)   Resp 14   Wt 64 kg (141 lb)   LMP 11/28/2009   SpO2 98%   BMI 24.20 kg/m   Physical Exam  Constitutional: She is oriented to person, place, and time. She appears well-developed and well-nourished. No distress.  HENT:  Head: Normocephalic and atraumatic.  Mouth/Throat: Oropharynx is clear and moist. No oropharyngeal exudate.  Eyes: EOM are normal. Pupils are equal, round, and reactive to light.  Neck: Normal range of motion. Neck supple. No JVD present. No tracheal deviation present. No thyromegaly present.  Patient does have some mild tenderness of the left trapezius  Cardiovascular: Normal rate and regular rhythm. Exam reveals no gallop and no friction rub.  No murmur heard. Pulmonary/Chest: Effort normal and breath sounds normal. No stridor. No respiratory distress. She has no wheezes. She has no rales. She exhibits no tenderness.  Abdominal: Soft. Bowel sounds are normal. There is no tenderness. There is no rebound and no guarding.  Musculoskeletal: Normal range of motion. She exhibits no edema or tenderness.  No lower extremity swelling, asymmetry or tenderness.  Distal pulses are 2+.  Lymphadenopathy:    She has no cervical adenopathy.  Neurological: She is alert and oriented to person, place, and time.  Moves all extremities without focal deficit.  Sensation fully intact.  Skin: Skin is warm and dry. Capillary refill takes less than 2 seconds. No rash noted. She is not diaphoretic. No erythema.  Psychiatric: She has a normal mood and affect. Her behavior is normal.  Nursing note and vitals reviewed.    ED Treatments / Results  Labs (all labs ordered are listed, but only abnormal results are displayed) Labs  Reviewed  BASIC METABOLIC PANEL - Abnormal; Notable for the following components:      Result Value   Glucose, Bld 189 (*)    All other components within normal limits  CBC  I-STAT TROPONIN, ED  I-STAT BETA HCG BLOOD, ED ([facility], [facility], [facility] ONLY)  I-STAT TROPONIN, ED    EKG  EKG Interpretation  Date/Time:  Monday July 18 2017 13:48:40 EST Ventricular Rate:  96 PR Interval:  124 QRS Duration: 70 QT Interval:  336 QTC Calculation: 424 R Axis:   29 Text Interpretation:  Normal sinus rhythm Biatrial enlargement Abnormal ECG Confirmed by Loren RacerYelverton, Tayler Heiden ([id number]) on 07/18/2017 8:18:52 PM       Radiology Dg Chest 2 View  Result Date: 07/18/2017 CLINICAL DATA:  Chest pain for the past 4 days with shortness of breath. EXAM:  CHEST  2 VIEW COMPARISON:  Chest x-ray dated July 11, 2004. FINDINGS: The heart size and mediastinal contours are within normal limits. Prior CABG. Normal pulmonary vascularity. No focal consolidation, pleural effusion, or pneumothorax. No acute osseous abnormality. IMPRESSION: No active cardiopulmonary disease. Electronically Signed   By: Obie Dredge M.D.   On: 07/18/2017 15:48    Procedures Procedures (including critical care time)  Medications Ordered in ED Medications  aspirin chewable tablet 324 mg (324 mg Oral Given 07/18/17 2131)     Initial Impression / Assessment and Plan / ED Course  I have reviewed the triage vital signs and the nursing notes.  Pertinent labs & imaging results that were available during my care of the patient were reviewed by me and considered in my medical decision making (see chart for details).     Patient remains chest pain-free.  EKG without concerning findings.  Initial troponin is normal.  Given aspirin in the emergency department.  Discussed with Dr. Mayford Knife.  Recommends admission to hospitalist to rule out MI.  Final Clinical Impressions(s) / ED Diagnoses   Final diagnoses:  Precordial chest pain    ED Discharge  Orders    None       Loren Racer, MD 07/18/17 2154

## 2017-07-18 NOTE — ED Notes (Signed)
IV team at bedside 

## 2017-07-18 NOTE — ED Notes (Signed)
ED Provider at bedside. 

## 2017-07-19 ENCOUNTER — Encounter (HOSPITAL_COMMUNITY): Payer: Self-pay | Admitting: Physician Assistant

## 2017-07-19 ENCOUNTER — Other Ambulatory Visit: Payer: Self-pay | Admitting: Physician Assistant

## 2017-07-19 ENCOUNTER — Observation Stay (HOSPITAL_BASED_OUTPATIENT_CLINIC_OR_DEPARTMENT_OTHER): Payer: 59

## 2017-07-19 DIAGNOSIS — R079 Chest pain, unspecified: Secondary | ICD-10-CM | POA: Diagnosis not present

## 2017-07-19 DIAGNOSIS — Z720 Tobacco use: Secondary | ICD-10-CM | POA: Insufficient documentation

## 2017-07-19 DIAGNOSIS — E785 Hyperlipidemia, unspecified: Secondary | ICD-10-CM | POA: Diagnosis not present

## 2017-07-19 DIAGNOSIS — Z87891 Personal history of nicotine dependence: Secondary | ICD-10-CM | POA: Insufficient documentation

## 2017-07-19 LAB — CBC
HCT: 39.3 % (ref 36.0–46.0)
Hemoglobin: 13.4 g/dL (ref 12.0–15.0)
MCH: 30.2 pg (ref 26.0–34.0)
MCHC: 34.1 g/dL (ref 30.0–36.0)
MCV: 88.5 fL (ref 78.0–100.0)
Platelets: 176 10*3/uL (ref 150–400)
RBC: 4.44 MIL/uL (ref 3.87–5.11)
RDW: 13.9 % (ref 11.5–15.5)
WBC: 7.7 10*3/uL (ref 4.0–10.5)

## 2017-07-19 LAB — COMPREHENSIVE METABOLIC PANEL
ALT: 15 U/L (ref 14–54)
AST: 18 U/L (ref 15–41)
Albumin: 3.7 g/dL (ref 3.5–5.0)
Alkaline Phosphatase: 98 U/L (ref 38–126)
Anion gap: 11 (ref 5–15)
BUN: 8 mg/dL (ref 6–20)
CO2: 24 mmol/L (ref 22–32)
Calcium: 9.6 mg/dL (ref 8.9–10.3)
Chloride: 108 mmol/L (ref 101–111)
Creatinine, Ser: 0.72 mg/dL (ref 0.44–1.00)
GFR calc Af Amer: >60 mL/min (ref >60)
GFR calc non Af Amer: >60 mL/min (ref >60)
Glucose, Bld: 122 mg/dL — ABNORMAL HIGH (ref 65–99)
Potassium: 3.6 mmol/L (ref 3.5–5.1)
Sodium: 143 mmol/L (ref 135–145)
Total Bilirubin: 1.3 mg/dL — ABNORMAL HIGH (ref 0.3–1.2)
Total Protein: 6 g/dL — ABNORMAL LOW (ref 6.5–8.1)

## 2017-07-19 LAB — ECHOCARDIOGRAM COMPLETE
Ao-asc: 27 cm
Area-P 1/2: 2.72 cm2
E decel time: 278 msec
E/e' ratio: 7.75
FS: 24 % — AB (ref 28–44)
IVS/LV PW RATIO, ED: 1.05
LA ID, A-P, ES: 35 mm
LA diam end sys: 35 mm
LA diam index: 2.05 cm/m2
LA vol A4C: 27.4 ml
LA vol index: 25 mL/m2
LA vol: 42.8 mL
LV E/e' medial: 7.75
LV E/e'average: 7.75
LV PW d: 10.7 mm — AB (ref 0.6–1.1)
LV e' LATERAL: 9.14 cm/s
LVOT area: 3.46 cm2
LVOT diameter: 21 mm
Lateral S' vel: 9.46 cm/s
MV Dec: 278
MV Peak grad: 2 mmHg
MV pk A vel: 73.7 m/s
MV pk E vel: 70.8 m/s
P 1/2 time: 81 ms
TAPSE: 18.3 mm
TDI e' lateral: 9.14
TDI e' medial: 5.55
Weight: 2256 oz

## 2017-07-19 LAB — CBG MONITORING, ED
Glucose-Capillary: 175 mg/dL — ABNORMAL HIGH (ref 65–99)
Glucose-Capillary: 120 mg/dL — ABNORMAL HIGH (ref 65–99)
Glucose-Capillary: 165 mg/dL — ABNORMAL HIGH (ref 65–99)
Glucose-Capillary: 106 mg/dL — ABNORMAL HIGH (ref 65–99)

## 2017-07-19 LAB — TROPONIN I
Troponin I: <0.03 ng/mL (ref <0.03)
Troponin I: <0.03 ng/mL (ref <0.03)

## 2017-07-19 LAB — HIV ANTIBODY (ROUTINE TESTING W REFLEX): HIV Screen 4th Generation wRfx: NONREACTIVE

## 2017-07-19 MED ORDER — METFORMIN HCL 500 MG PO TABS: 500.0000 mg | ORAL_TABLET | Freq: Every evening | ORAL | Status: DC

## 2017-07-19 MED ORDER — INSULIN ASPART 100 UNIT/ML ~~LOC~~ SOLN
0.0000 [IU] | SUBCUTANEOUS | Status: DC
  Administered 2017-07-19 (×2): 2 [IU] via SUBCUTANEOUS
  Filled 2017-07-19 (×2): qty 1

## 2017-07-19 MED ORDER — LIVING WELL WITH DIABETES BOOK
Freq: Once | Status: DC
  Filled 2017-07-19: qty 1

## 2017-07-19 MED ORDER — METFORMIN HCL 500 MG PO TABS: 500.0000 mg | ORAL_TABLET | Freq: Every evening | ORAL | 0 refills | Status: DC

## 2017-07-19 NOTE — Discharge Summary (Signed)
Physician Discharge Summary [facility]    Patient: Bailey Greene                   Admit date: 07/18/2017   DOB: 02/25/1961             Discharge date:07/19/2017/12:36 PM ZOX:[id number]                           PCP: Sandford Craze, NP Recommendations for Outpatient Follow-up:   1.  Please follow-up with your primary care physician within 1-2 weeks. 2.  Please follow-up with a cardiologist 2-4 weeks    Discharge Condition: Stable  CODE STATUS:  Full code   Diet recommendation:    Diabetic diet  / healthy diet  ----------------------------------------------------------------------------------------------------------------------  Discharge Diagnoses:   Principal Problem:   Chest pain with moderate risk for cardiac etiology Active Problems:   Hyperglycemia   History of present illness :  JacquelinePetersis a56 y.o.female,w hypertension, hyperlipidemia, CAD s/p CABG, apparently c/o chest pressure for the past 4 days. Slightly worse today and therefore presented to ED. Slight dyspnea with exertion. Pt denies fever, chills, cough, palp, n/v, abd pain, diarrhea, brbpr. Pt states no recent stress testing. Pt will be admitted for w/up of chest pain.   Hospital course / Brief Summary:    Chest pain with moderate risk for cardiac etiology -history of coronary artery disease, and CABG  -Arctic enzymes x3 sets negative -No acute changes on EKG, no change in a monitor greater than 12 hours -N.p.o. -Cardiology has seen and evaluated the patient  2D echocardiogram -reviewed essentially within normal limits with ejection fraction of 55-60%  Cardio just has cleared the patient to discharge, no need to pursue with left heart catheterization as his cardiac enzymes were negative, echocardiogram was within normal limits.  Diabetes mellitus/hyperglycemia Patient has been started on metformin 500 p.o. daily, will continue Checking fasting blood sugars, sliding  scale insulin Hemoglobin A1c = 7.8 Past 12-hour blood sugars 189, 122   Consultations:  Etiology  Procedures: 2D echocardiogram ----------------------------------------------------------------------------------------------------------------------  Discharge Instructions:   Discharge Instructions    Activity as tolerated - No restrictions   Complete by:  As directed    Ambulatory referral to Nutrition and Diabetic Education   Complete by:  As directed    Diet Carb Modified   Complete by:  As directed    Diabetic diet   Discharge instructions   Complete by:  As directed    These follow-up with your PCP. Monitor your blood sugars closely New medication Metformin for your diabetes Please follow-up with PCP nutrition diabetic education   Increase activity slowly   Complete by:  As directed        Medication List    TAKE these medications   aspirin 81 MG chewable tablet Chew 81 mg by mouth daily.   glucose blood test strip Commonly known as:  ACCU-CHEK GUIDE Use to check blood sugar once a day.  DX  E11.65   metFORMIN 500 MG tablet Commonly known as:  GLUCOPHAGE Take 1 tablet (500 mg total) by mouth every evening.   metoprolol succinate 25 MG 24 hr tablet Commonly known as:  TOPROL-XL TAKE 1 TABLET (25 MG TOTAL) BY MOUTH DAILY.   simvastatin 80 MG tablet Commonly known as:  ZOCOR TAKE 1 TABLET (80 MG TOTAL) BY MOUTH AT BEDTIME. What changed:  See the new instructions.      Follow-up Information    [facility] Follow up on 07/27/2017.   Specialty:  Cardiology Why:  Stress test at 10:00 am, please arrive at 9:30 am.  Contact information: [address] [place] [address] [phone]         No Known Allergies Procedures/Studies: Dg Chest 2 View  Result Date: 07/18/2017 CLINICAL DATA:  Chest pain for the past 4 days with shortness of breath. EXAM: CHEST  2 VIEW COMPARISON:  Chest x-ray dated July 11, 2004.  FINDINGS: The heart size and mediastinal contours are within normal limits. Prior CABG. Normal pulmonary vascularity. No focal consolidation, pleural effusion, or pneumothorax. No acute osseous abnormality. IMPRESSION: No active cardiopulmonary disease. Electronically Signed   By: Obie Dredge M.D.   On: 07/18/2017 15:48      Subjective: Patient was seen and examined 07/19/2017, 12:36 PM Patient stable  Today. No acute distress.  No issues overnight Stable for discharge.  Discharge Exam:  Vitals:   07/19/17 0800 07/19/17 0900 07/19/17 0901 07/19/17 1000  BP: 102/60 114/83 114/83 117/79  Pulse: 81 88 88 (!) 104  Resp: 14 12  10   Temp:      TempSrc:      SpO2: 97% 98%  96%  Weight:        General: Pt lying comfortably in bed & appears in no obvious distress. Cardiovascular: S1 & S2 heard, RRR, S1/S2 +. No murmurs, rubs, gallops or clicks. No JVD or pedal edema. Respiratory: Clear to auscultation without wheezing, rhonchi or crackles. No increased work of breathing. Abdominal:  Non distended, non tender & soft. No organomegaly or masses appreciated. Normal bowel sounds heard. CNS: Alert and oriented. No focal deficits. Extremities: no edema, no cyanosis    The results of significant diagnostics from this hospitalization (including imaging, microbiology, ancillary and laboratory) are listed below for reference.     Microbiology: No results found for this or any previous visit (from the past 240 hour(s)).   Labs: CBC: Recent Labs  Lab 07/18/17 1347 07/19/17 0331  WBC 7.1 7.7  HGB 14.0 13.4  HCT 41.0 39.3  MCV 88.7 88.5  PLT 193 176   Basic Metabolic Panel: Recent Labs  Lab 07/18/17 1347 07/19/17 0331  NA 140 143  K 4.0 3.6  CL 106 108  CO2 23 24  GLUCOSE 189* 122*  BUN 6 8  CREATININE 0.70 0.72  CALCIUM 9.9 9.6   Liver Function Tests: Recent Labs  Lab 07/19/17 0331  AST 18  ALT 15  ALKPHOS 98  BILITOT 1.3*  PROT 6.0*  ALBUMIN 3.7   BNP (last 3  results) No results for input(s): BNP in the last 8760 hours. Cardiac Enzymes: Recent Labs  Lab 07/18/17 2329 07/19/17 0331  TROPONINI <0.03 <0.03   CBG: Recent Labs  Lab 07/19/17 0121 07/19/17 0345 07/19/17 0812 07/19/17 1225  GLUCAP 175* 120* 165* 106*   Hgb A1c Recent Labs    07/18/17 2329  HGBA1C 7.8*   Lipid Profile No results for input(s): CHOL, HDL, LDLCALC, TRIG, CHOLHDL, LDLDIRECT in the last 72 hours. Thyroid function studies No results for input(s): TSH, T4TOTAL, T3FREE, THYROIDAB in the last 72 hours.  Invalid input(s): FREET3 Anemia work up No results for input(s): VITAMINB12, FOLATE, FERRITIN, TIBC, IRON, RETICCTPCT in the last 72 hours. Urinalysis    Component Value Date/Time   COLORURINE YELLOW 06/22/2012 0840   APPEARANCEUR CLEAR 06/22/2012 0840   LABSPEC 1.014 06/22/2012 0840   PHURINE 6.0 06/22/2012 0840   GLUCOSEU NEG 06/22/2012 0840  HGBUR NEG 06/22/2012 0840   BILIRUBINUR NEG 06/22/2012 0840   KETONESUR NEG 06/22/2012 0840   PROTEINUR NEG 06/22/2012 0840   UROBILINOGEN 0.2 06/22/2012 0840   NITRITE NEG 06/22/2012 0840   LEUKOCYTESUR NEG 06/22/2012 0840    Time coordinating discharge: Over 30 minutes  SIGNED: Kendell BaneSeyed A Shreyan Hinz, MD, FACP, FHM. [facility],  Pager [phone]  If 7PM-7AM, please contact night-coverage [email or web address], Password [email or web address] 07/19/2017, 12:36 PM

## 2017-07-19 NOTE — Consult Note (Signed)
Cardiology Consultation:   Patient ID: Bailey Greene; [id number]; 1960-12-14   Admit date: 07/18/2017 Date of Consult: 07/19/2017  Primary Care Provider: Sandford Craze, NP Primary Cardiologist: Dr Antoine Poche Primary Electrophysiologist:  n/a   Patient Profile:   Bailey Greene is a 57 y.o. female with a hx of CABG 2006 w/ LIMA-LAD, RIMA-Diag, SVG-OM1-OM2, R radial-PDA, nl ETT 07/2016, patent grafts 2007 cath, DM, HTN, HLD, migraines, tob use, who is being seen today for the evaluation of chest pain at the request of Dr Flossie Dibble.  History of Present Illness:   Ms. Forsberg was seen by Dr Antoine Poche 06/30/2017, c/o CP in the setting of emotional stress, but not while exercising.   Pt has been having the CP for years, it has a sharp quality, is also squeezing and tight. It will reach 8/10. It is in her L chest. She will press a hand to her chest and the pain will resolve in 5-10 minutes. She feels the sx are similar to her pre-CABG sx. No SOB, N&V or diaphoresis.  No association w/ exertion, meals, deep inspiration, position changes. Used to have SL NTG, it did not help, does not have now. Tried ASA last pm, it seemed to help.  The CP that got her here started 3 or so days ago. Her usual CP but it did not resolve. She became concerned yesterday when the sx did not resolve and came to the ER. She got ASA 324 mg, no other rx. She was more worried about this episode because she had tingling in her hands that reminded her of her pre-CABG sx. After the ASA, the sx gradually improved and she woke this am w/out CP.  She has not been sick recently, she has not been exercising much, just hasn't felt like it.    Past Medical History:  Diagnosis Date  . Anxiety   . CAD (coronary artery disease) 2006   CABG w/ LIMA-LAD, RIMA-Diag, SVG-OM1-OM2, R radial-PDA  . 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   Qualifier: Diagnosis of  By: Cheri Guppy    . Diabetes mellitus type 2 in nonobese (HCC) 07/2016  .  Dyslipidemia   . Headache(784.0)   . HTN (hypertension)   . Hyperlipidemia   . Migraine     Past Surgical History:  Procedure Laterality Date  . BUNIONECTOMY  08/2011   right foot  . CARDIAC CATHETERIZATION  2007   severe native 3 v dz, all grafts patent (LIMA-LAD, RIMA-Diag, SVG-OM1-OM2, R radial-PDA)  . CESAREAN SECTION  1984  . CORONARY ARTERY BYPASS GRAFT  2006    Coronary artery bypass grafting x5 with a left  internal  mammary to the left anterior descending coronary artery.  Free right  internal mammary to the diagonal coronary artery, sequential reverse  saphenous vein graft to the first and second obtuse marginal, right  artery bypass to the posterior descending coronary artery with endo-vein harvesting.     Prior to Admission medications   Medication Sig Start Date End Date Taking? Authorizing Provider  aspirin 81 MG chewable tablet Chew 81 mg by mouth daily.   Yes [provider]  glucose blood (ACCU-CHEK GUIDE) test strip Use to check blood sugar once a day.  DX  E11.65 08/18/16  Yes Sandford Craze, NP  metoprolol succinate (TOPROL-XL) 25 MG 24 hr tablet TAKE 1 TABLET (25 MG TOTAL) BY MOUTH DAILY. 07/08/17  Yes Rollene Rotunda, MD  simvastatin (ZOCOR) 80 MG tablet TAKE 1 TABLET (80 MG TOTAL) BY MOUTH AT BEDTIME. Patient taking differently:  80mg  by mouth every night at bedtime 12/10/16  Yes Rollene Rotunda, MD    Inpatient Medications: Scheduled Meds: . aspirin  81 mg Oral Daily  . atorvastatin  40 mg Oral q1800  . enoxaparin (LOVENOX) injection  40 mg Subcutaneous Daily  . insulin aspart  0-9 Units Subcutaneous Q4H  . living well with diabetes book   Does not apply Once  . metFORMIN  500 mg Oral QPM  . metoprolol succinate  25 mg Oral Daily   Continuous Infusions:  PRN Meds: acetaminophen **OR** acetaminophen  Allergies:   No Known Allergies  Social History:   Social History   Socioeconomic History  . Marital status: Legally Separated    Spouse  name: Not on file  . Number of children: Not on file  . Years of education: Not on file  . Highest education level: Not on file  Social Needs  . Financial resource strain: Not on file  . Food insecurity - worry: Not on file  . Food insecurity - inability: Not on file  . Transportation needs - medical: Not on file  . Transportation needs - non-medical: Not on file  Occupational History  . Not on file  Tobacco Use  . Smoking status: Former Smoker    Packs/day: 0.50    Years: 8.00    Pack years: 4.00    Types: Cigarettes    Last attempt to quit: 12/25/2012    Years since quitting: 4.5  . Smokeless tobacco: Never Used  Substance and Sexual Activity  . Alcohol use: No    Alcohol/week: 0.0 oz  . Drug use: No  . Sexual activity: Yes  Other Topics Concern  . Not on file  Social History Narrative   Regular exercise:  3 days weekly   Caffeine Use:  1 soda daily   One child biological daughter born in 7 and an adopted niece.   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           Family History:   Family History  Problem Relation Age of Onset  . Lupus Mother   . Heart attack Father   . Diabetes Father   . Hypertension Father   . Diabetes Paternal Grandmother   . Hypertension Paternal Grandmother   . Stroke Neg Hx   . Kidney disease Neg Hx   . Hyperlipidemia Neg Hx   . Sudden death Neg Hx    Family Status:  Family Status  Relation Name Status  . Mother  Deceased  . Father  Deceased  . PGM  (Not Specified)  . Neg Hx  (Not Specified)    ROS:  Please see the history of present illness.  All other ROS reviewed and negative.     Physical Exam/Data:   Vitals:   07/19/17 0800 07/19/17 0900 07/19/17 0901 07/19/17 1000  BP: 102/60 114/83 114/83 117/79  Pulse: 81 88 88 (!) 104  Resp: 14 12  10   Temp:      TempSrc:      SpO2: 97% 98%  96%  Weight:        Intake/Output Summary (Last 24 hours) at 07/19/2017 1208 Last data filed at 07/19/2017  1024 Gross per 24 hour  Intake 667.5 ml  Output -  Net 667.5 ml   Filed Weights   07/18/17 1345  Weight: 141 lb (64 kg)   Body mass index is 24.2 kg/m.  General:  Well nourished, well developed, in no acute distress  HEENT: normal Lymph: no adenopathy Neck: no JVD Endocrine:  No thryomegaly Vascular: No carotid bruits; 4/4 extremity pulses 2+, without bruits  Cardiac:  normal S1, S2; RRR; no murmur Lungs:  clear to auscultation bilaterally, no wheezing, rhonchi or rales  Abd: soft, nontender, no hepatomegaly  Ext: no edema Musculoskeletal:  No deformities, BUE and BLE strength normal and equal Skin: warm and dry  Neuro:  CNs 2-12 intact, no focal abnormalities noted Psych:  Normal affect   EKG:  The EKG was personally reviewed and demonstrates:  SR, HR 71, nl intervals, no acute ischemic changes Telemetry:  Telemetry was personally reviewed and demonstrates:  SR  Relevant CV Studies:  ECHO: 07/19/2017 - Left ventricle: The cavity size was normal. There was mild   concentric hypertrophy. Systolic function was normal. The   estimated ejection fraction was in the range of 55% to 60%. Wall   motion was normal; there were no regional wall motion   abnormalities. Doppler parameters are consistent with abnormal   left ventricular relaxation (grade 1 diastolic dysfunction).   Doppler parameters are consistent with indeterminate ventricular   filling pressure. - Aortic valve: There was no regurgitation. - Mitral valve: Transvalvular velocity was within the normal range.   There was no evidence for stenosis. There was no regurgitation. - Left atrium: The atrium was mildly dilated. - Right ventricle: The cavity size was normal. Wall thickness was   normal. Systolic function was normal. - Tricuspid valve: There was no regurgitation.   MYOVIEW: 12/27/2014  Nuclear stress EF: 74%.  Horizontal ST segment depression ST segment depression of 3 mm was noted during stress in the  II, III and aVF leads, and returning to baseline after less than 1 minute of recovery.  This is a low risk study.  The left ventricular ejection fraction is hyperdynamic (>65%).  No scintigraphic evidence of ischemia or scar. Good exercise tolerance. Vigorous LV systolic function.  CATH: 06/30/2005 Coronaries:  Left main was normal.  The LAD had severe diffuse proximal disease with focal 95% stenosis  compromising septal perforators.  There was long mid 75% stenosis.  The LAD  distal wrapped the apex.  It consisted of two small diagonals.  These  vessels were free of disease.  Circumflex in the AV groove was normal.  There was a small ramus  intermediate (referred to as a diagonal in the operative note) that had  proximal severe diffuse disease, OM-1 and OM-2 both had proximal severe  diffuse disease.  All three of these vessels were seen to fill via grafts.  The right coronary artery was a long dominant vessel.  It had proximal mid  long 50% stenosis.  There was a distal long 70% stenosis before the PDA.  The proximal portion of the PDA had 80% stenosis.  The distal vessel was  free of high grade disease and seemed to fill via a graft.  Grafts:  LIMA to the LAD was widely patent.  Free RIMA to the PDA was patent, though somewhat narrow in caliber.  Saphenous vein graft sequential to OM-1 and OM-2 was widely patent.  A free radial appeared to be anastomosed at its proximal segment to the  origin of the saphenous vein graft.  The distal anastomosis was to the ramus intermediate (diagonal).  This was a  small graft but was patent and free of high grade disease.  Left ventriculogram:  The left ventriculogram was obtained in the RAO  projection.  EF was 65% with normal  wall motion.  CONCLUSION:  Severe three vessel coronary artery disease.  Patent grafts.  Well preserved ejection fraction.    ETT: 08/05/2016  Blood pressure demonstrated a normal response to exercise.  There  was no ST segment deviation noted during stress.  ETT with fair exercise tolerance (7:01); no chest pain; normal BP response; no diagnostic ST changes; negative adequate ETT; Duke treadmill score 7.  Laboratory Data:  Chemistry Recent Labs  Lab 07/18/17 1347 07/19/17 0331  NA 140 143  K 4.0 3.6  CL 106 108  CO2 23 24  GLUCOSE 189* 122*  BUN 6 8  CREATININE 0.70 0.72  CALCIUM 9.9 9.6  GFRNONAA >60 >60  GFRAA >60 >60  ANIONGAP 11 11    Lab Results  Component Value Date   ALT 15 07/19/2017   AST 18 07/19/2017   ALKPHOS 98 07/19/2017   BILITOT 1.3 (H) 07/19/2017   Hematology Recent Labs  Lab 07/18/17 1347 07/19/17 0331  WBC 7.1 7.7  RBC 4.62 4.44  HGB 14.0 13.4  HCT 41.0 39.3  MCV 88.7 88.5  MCH 30.3 30.2  MCHC 34.1 34.1  RDW 14.2 13.9  PLT 193 176   Cardiac Enzymes Recent Labs  Lab 07/18/17 2329 07/19/17 0331  TROPONINI <0.03 <0.03    Recent Labs  Lab 07/18/17 1425 07/18/17 2336  TROPIPOC 0.00 0.00    BNPNo results for input(s): BNP, PROBNP in the last 168 hours.  DDimer No results for input(s): DDIMER in the last 168 hours. TSH:  Lab Results  Component Value Date   TSH 0.40 09/27/2016   Lipids: Lab Results  Component Value Date   CHOL 157 08/17/2016   HDL 60.60 08/17/2016   LDLCALC 74 08/17/2016   LDLDIRECT 145.2 07/18/2008   TRIG 114.0 08/17/2016   CHOLHDL 3 08/17/2016   HgbA1c: Lab Results  Component Value Date   HGBA1C 7.8 (H) 07/18/2017   Magnesium: No results found for: MG Lipid Panel     Component Value Date/Time   CHOL 157 08/17/2016 1225   TRIG 114.0 08/17/2016 1225   HDL 60.60 08/17/2016 1225   CHOLHDL 3 08/17/2016 1225   VLDL 22.8 08/17/2016 1225   LDLCALC 74 08/17/2016 1225   LDLDIRECT 145.2 07/18/2008 [id number]    Radiology/Studies:  Dg Chest 2 View  Result Date: 07/18/2017 CLINICAL DATA:  Chest pain for the past 4 days with shortness of breath. EXAM: CHEST  2 VIEW COMPARISON:  Chest x-ray dated July 11, 2004.  FINDINGS: The heart size and mediastinal contours are within normal limits. Prior CABG. Normal pulmonary vascularity. No focal consolidation, pleural effusion, or pneumothorax. No acute osseous abnormality. IMPRESSION: No active cardiopulmonary disease. Electronically Signed   By: Obie DredgeWilliam T Derry M.D.   On: 07/18/2017 15:48    Assessment and Plan:   Principal Problem: 1.  Chest pain with moderate risk for cardiac etiology - ECG not acute and ez neg despite > 24 hr of pain - some relief w/ ASA, consider trial of NSAIDs or their prn use if MV is normal - echo w/ nl EF, no WMA - can do MV as outpt, cannot get today>>have arranged for next week. - no further inpt workup indicated.  - OK for d/c from a cardiac standpoint.   Active Problems: 2.  Hyperglycemia, hx DM2 - Per IM  3.  Tobacco use - cessation encouraged    For questions or updates, please contact [facility] Please consult [email or web address] for contact info under Cardiology/STEMI.   Signed, Bjorn Loserhonda  Barrett, PA-C  07/19/2017 12:08 PM    Patient seen and examined. Agree with assessment and plan.  29-year-old American female who is known CAD and in 2006 underwent CABG revascularization surgery with a LIMA to the LAD, RIMA to the diagonal, marginal, vein graft to the OM1 and OM 2, and right radial graft to the PDA.  A repeat cardiac catheterization in 2007 showed patent grafts.  She is followed by Dr. Antoine Poche.  She has history diabetes mellitus, hypertension, hyperlipidemia, migraine headaches, as well as tobacco use.  Her last treadmill test was in March 2018, which was a graded exercise treadmill test without nuclear imaging.  She has a membership to [employer] where she exercises on the treadmill.  She denies any significant exertional chest tightness.  Over the past 3 days she has noticed a left shoulder chest discomfort.  She denied any severe pressure.  She'll likely presented to the emergency room.  ECG showed nonspecific ST  changes in lead 2.  Troponins have been negative.  Her pain later improved following aspirin therapy.  She admits to being under increased anxiety and stress.  We are asked to see today for further evaluation with her being nothing by mouth for possible cardiac testing.  Presently, she is pain-free.  Resting pulses in the 80s and she is on Toprol-XL 25 mg daily.  She is on insulin.  She has been on atorvastatin 40 mg daily.  Her last lipid study in March 2018 showed an LDL at 24.  Physical exam is notable for blood pressure 118/80; pulse in the 80s.  HEENT is unremarkable.  She did not have carotid bruits.  She did not have chest wall tenderness to palpation.  Slightly decreased breath sounds to her lungs without wheezing.  Rhythm was regular with no S3 or S4 gallop. There is a very faint 1/6 systolic murmur.  Abdomen soft and nontender without about a spot likely.  Pulses are 2+.  There is no edema.  Neurologic exam is grossly nonfocal.  Ms. Blandino is 13 years status post CABG revascularization and has cardiac risk factors of tobacco use, diabetes mellitus, hypertension, and hyperlipidemia.  She was nothing by mouth today.  We called down to the nuclear lab to see if there is any possibility of obtaining a nuclear stress test today.  However, the scheduled did not allow this.  As result, the patient seems stable.  Enzymes are negative.  There have not been new ECG changes.  He will slightly titrate beta blocker therapy for more improved rate control.  The patient can most likely be discharged later today.  We will arrange for an exercise treadmill nuclear perfusion study to be done as soon as possible, within the week and she will hold beta blocker therapy for 48 hours prior to her exercise study.  She will then follow-up with Dr. Antoine Poche, who is her primary cardiologist.   Lennette Bihari, MD, [facility] 07/19/2017 1:04 PM

## 2017-07-19 NOTE — Discharge Instructions (Signed)
° ° °  [facility] on 07/28/2017 at 10:00 am  No food after midnight before. Can have clear liquids such as water or broth up to 4 hours before.  No caffeine/decaf products 24hr before, including meds such as Excedrin or Goody-Powders. Call if there are any questions.  OK to take am meds with a sip of water. Arrive about 15 min early for paperwork. Wear comfortable clothes and shoes.  Do NOT take: Beta blockers such as metoprolol/lopressor/Toprol XL or calcium channel blockers such as cardizem/Diltiazem or verapmil/Calan for 48 hours before the test.    Discuss with MD what to do about diabetes meds if you take these.  When you arrive in the lab, the technician will inject a small amount of radioactive tracer. After a waiting period, resting pictures will be obtained.   You will be prepped for the stress portion of the test. With the stress (medical or treadmill), another small amount of radioactive tracer will be injected.  You will get a second set of pictures after a waiting period.   The whole test will take several hours.

## 2017-07-19 NOTE — ED Notes (Signed)
Attempted to call report to [facility]. [facility] has called ECHO to see if procedure can be done now, since discharge info states patient to be discharged after ECHO complete.

## 2017-07-19 NOTE — ED Notes (Signed)
Carb-Modified. Ordered lunch tray: Tabithia took order.

## 2017-07-19 NOTE — Progress Notes (Signed)
  Echocardiogram 2D Echocardiogram has been performed.  Jewelz Kobus G Lanetta Figuero 07/19/2017, 11:47 AM

## 2017-07-19 NOTE — Progress Notes (Signed)
PROGRESS NOTE    Patient: Bailey Greene     PCP: Sandford Craze, NP                    DOB: Dec 24, 1960            DOA: 07/18/2017 ZOX:[id number]             DOS: 07/19/2017, 12:15 PM   LOS: 0 days   Date of Service: The patient was seen and examined on 07/19/2017  Subjective:   Patient was seen and examined this morning, chest pain-free, no acute distress, still n.p.o.  ----------------------------------------------------------------------------------------------------------------------  Brief Narrative:   Bailey Greene  is a 57 y.o. female, w hypertension, hyperlipidemia, CAD s/p CABG, apparently c/o chest pressure for the past 4 days.  Slightly worse today and therefore presented to ED.  Slight dyspnea with exertion.  Pt denies fever, chills, cough, palp, n/v, abd pain, diarrhea, brbpr.  Pt states no recent stress testing. Pt will be admitted for w/up of chest pain.  Assessment & Plan:    Chest pain with moderate risk for cardiac etiology -history of coronary artery disease, and CABG  -Arctic enzymes x3 sets negative -No acute changes on EKG, no change in a monitor greater than 12 hours -N.p.o. -Cardiology has seen and evaluated the patient  2D echocardiogram -reviewed essentially within normal limits with ejection fraction of 55-60%    Diabetes mellitus/hyperglycemia Patient has been started on metformin 500 p.o. daily, will continue Checking fasting blood sugars, sliding scale insulin Hemoglobin A1c = 7.8 Past 12-hour blood sugars 189, 122    DVT prophylaxis:   SCDs/compression stockings     Code Status:         Full code  Family Communication:  The above findings and plan of care has been discussed with patient and family in detail, they expressed understanding and agreement of above. Disposition Plan:  1-2 days,            Home   Consultants:   Cardiology  Procedures:    2D echocardiogram:   Antimicrobials:  Anti-infectives (From admission,  onward)   None        Objective: Vitals:   07/19/17 0800 07/19/17 0900 07/19/17 0901 07/19/17 1000  BP: 102/60 114/83 114/83 117/79  Pulse: 81 88 88 (!) 104  Resp: 14 12  10   Temp:      TempSrc:      SpO2: 97% 98%  96%  Weight:        Intake/Output Summary (Last 24 hours) at 07/19/2017 1215 Last data filed at 07/19/2017 1024 Gross per 24 hour  Intake 667.5 ml  Output -  Net 667.5 ml   Filed Weights   07/18/17 1345  Weight: 64 kg (141 lb)    Examination:  General exam: Appears calm and comfortable  Psychiatry: Judgement and insight appear normal. Mood & affect appropriate. HEENT: WNLs Respiratory system: Clear to auscultation. Respiratory effort normal. Cardiovascular system: S1 & S2 heard, RRR. No JVD, murmurs, rubs, gallops or clicks. No pedal edema. Gastrointestinal system: Abd. nondistended, soft and nontender. No organomegaly or masses felt. Normal bowel sounds heard. Central nervous system: Alert and oriented. No focal neurological deficits. Extremities: Symmetric 5 x 5 power. Skin: No rashes, lesions or ulcers Wounds:   Data Reviewed: I have personally reviewed following labs and imaging studies  CBC: Recent Labs  Lab 07/18/17 1347 07/19/17 0331  WBC 7.1 7.7  HGB 14.0 13.4  HCT 41.0 39.3  MCV 88.7 88.5  PLT 193 176   Basic Metabolic Panel: Recent Labs  Lab 07/18/17 1347 07/19/17 0331  NA 140 143  K 4.0 3.6  CL 106 108  CO2 23 24  GLUCOSE 189* 122*  BUN 6 8  CREATININE 0.70 0.72  CALCIUM 9.9 9.6   GFR: Estimated Creatinine Clearance: 69.4 mL/min (by C-G formula based on SCr of 0.72 mg/dL). Liver Function Tests: Recent Labs  Lab 07/19/17 0331  AST 18  ALT 15  ALKPHOS 98  BILITOT 1.3*  PROT 6.0*  ALBUMIN 3.7   No results for input(s): LIPASE, AMYLASE in the last 168 hours. No results for input(s): AMMONIA in the last 168 hours. Coagulation Profile: No results for input(s): INR, PROTIME in the last 168 hours. Cardiac  Enzymes: Recent Labs  Lab 07/18/17 2329 07/19/17 0331  TROPONINI <0.03 <0.03   BNP (last 3 results) No results for input(s): PROBNP in the last 8760 hours. HbA1C: Recent Labs    07/18/17 2329  HGBA1C 7.8*   CBG: Recent Labs  Lab 07/19/17 0121 07/19/17 0345 07/19/17 0812  GLUCAP 175* 120* 165*   Lipid Profile: No results for input(s): CHOL, HDL, LDLCALC, TRIG, CHOLHDL, LDLDIRECT in the last 72 hours. Thyroid Function Tests: No results for input(s): TSH, T4TOTAL, FREET4, T3FREE, THYROIDAB in the last 72 hours. Anemia Panel: No results for input(s): VITAMINB12, FOLATE, FERRITIN, TIBC, IRON, RETICCTPCT in the last 72 hours. Sepsis Labs: No results for input(s): PROCALCITON, LATICACIDVEN in the last 168 hours.  No results found for this or any previous visit (from the past 240 hour(s)).    Radiology Studies: Dg Chest 2 View  Result Date: 07/18/2017 CLINICAL DATA:  Chest pain for the past 4 days with shortness of breath. EXAM: CHEST  2 VIEW COMPARISON:  Chest x-ray dated July 11, 2004. FINDINGS: The heart size and mediastinal contours are within normal limits. Prior CABG. Normal pulmonary vascularity. No focal consolidation, pleural effusion, or pneumothorax. No acute osseous abnormality. IMPRESSION: No active cardiopulmonary disease. Electronically Signed   By: Obie DredgeWilliam T Derry M.D.   On: 07/18/2017 15:48    Scheduled Meds: . aspirin  81 mg Oral Daily  . atorvastatin  40 mg Oral q1800  . enoxaparin (LOVENOX) injection  40 mg Subcutaneous Daily  . insulin aspart  0-9 Units Subcutaneous Q4H  . living well with diabetes book   Does not apply Once  . metFORMIN  500 mg Oral QPM  . metoprolol succinate  25 mg Oral Daily   Continuous Infusions:  Time spent: >25 minutes  Kendell BaneSeyed A Lily Kernen, MD [facility],  Pager [phone]  If 7PM-7AM, please contact night-coverage [email or web address]   Password [email or web address]  07/19/2017, 12:15 PM

## 2017-07-25 ENCOUNTER — Telehealth (HOSPITAL_COMMUNITY): Payer: Self-pay | Admitting: *Deleted

## 2017-07-25 ENCOUNTER — Telehealth (HOSPITAL_COMMUNITY): Payer: Self-pay

## 2017-07-25 ENCOUNTER — Telehealth: Payer: Self-pay | Admitting: Cardiology

## 2017-07-25 NOTE — Telephone Encounter (Signed)
New Message:    Please call,she said she needs to ask some clinical questions.

## 2017-07-25 NOTE — Telephone Encounter (Signed)
Patient given detailed instructions per Myocardial Perfusion Study Information Sheet for the test on 07/27/2017 at 10:00. Patient notified to arrive 15 minutes early and that it is imperative to arrive on time for appointment to keep from having the test rescheduled.  If you need to cancel or reschedule your appointment, please call the office within 24 hours of your appointment. . Patient verbalized understanding.EHK

## 2017-07-25 NOTE — Telephone Encounter (Signed)
Left message on voicemail per DPR in reference to upcoming appointment scheduled on 07/27/17 at 1000 with detailed instructions given per Myocardial Perfusion Study Information Sheet for the test. LM to arrive 15 minutes early, and that it is imperative to arrive on time for appointment to keep from having the test rescheduled. If you need to cancel or reschedule your appointment, please call the office within 24 hours of your appointment. Failure to do so may result in a cancellation of your appointment, and a $50 no show fee. Phone number given for call back for any questions.

## 2017-07-25 NOTE — Telephone Encounter (Signed)
Left message for Bailey Greene to call

## 2017-07-26 NOTE — Telephone Encounter (Signed)
LMTCB

## 2017-07-27 ENCOUNTER — Ambulatory Visit (HOSPITAL_COMMUNITY): Payer: 59 | Attending: Cardiovascular Disease

## 2017-07-27 DIAGNOSIS — Z8249 Family history of ischemic heart disease and other diseases of the circulatory system: Secondary | ICD-10-CM | POA: Diagnosis not present

## 2017-07-27 DIAGNOSIS — R0602 Shortness of breath: Secondary | ICD-10-CM | POA: Insufficient documentation

## 2017-07-27 DIAGNOSIS — R079 Chest pain, unspecified: Secondary | ICD-10-CM | POA: Insufficient documentation

## 2017-07-27 DIAGNOSIS — R931 Abnormal findings on diagnostic imaging of heart and coronary circulation: Secondary | ICD-10-CM | POA: Diagnosis not present

## 2017-07-27 LAB — MYOCARDIAL PERFUSION IMAGING
Estimated workload: 10.1 METS
Exercise duration (min): 8 min
Exercise duration (sec): 0 s
LV dias vol: 92 mL (ref 46–106)
LV sys vol: 34 mL
MPHR: 166 {beats}/min
Peak HR: 148 {beats}/min
Percent HR: 89 %
RATE: 0.26
Rest HR: 77 {beats}/min
SDS: 0
SRS: 3
SSS: 3
TID: 0.89

## 2017-07-27 MED ORDER — TECHNETIUM TC 99M TETROFOSMIN IV KIT
10.5000 | PACK | Freq: Once | INTRAVENOUS | Status: AC | PRN
  Administered 2017-07-27: 10.5 via INTRAVENOUS
  Filled 2017-07-27: qty 11

## 2017-07-27 MED ORDER — TECHNETIUM TC 99M TETROFOSMIN IV KIT
30.6000 | PACK | Freq: Once | INTRAVENOUS | Status: AC | PRN
  Administered 2017-07-27: 30.6 via INTRAVENOUS
  Filled 2017-07-27: qty 31

## 2017-08-01 ENCOUNTER — Ambulatory Visit: Payer: 59 | Admitting: Registered"

## 2017-08-15 ENCOUNTER — Ambulatory Visit: Payer: 59 | Admitting: Adult Health

## 2017-08-30 NOTE — Progress Notes (Signed)
Cardiology Office Note   Date:  08/30/2017   ID:  Bailey Greene, DOB 08/07/1960, MRN [id number]  PCP:  Sandford Craze'Sullivan, Melissa, NP  Cardiologist:  Dr. Antoine PocheHochrein  No chief complaint on file.    History of Present Illness: Bailey Greene is a 57 y.o. female who presents for post discharge, with known history of coronary artery disease status post CABG in 2006, LIMA to LAD, RIMA to diagonal, SVG to OM1 and OM 2, right radial PDA,patent grafts 2007 cath, DM, HTN, HLD, migraines, tob use.  Patient was seen on consultation on 07/19/2017 after being admitted for chest pain in setting of emotional stress.  Apparently the patient has had a long-standing history of sharp chest pain, symptoms were found to be similar per patient to free CABG symptoms.  The patient comes to the office today tearful.  She states that since being home from the hospital her sister has died, and she and her husband have separated.  She believes that her recurrence of chest pain was related to stress and anxiety around these events.  She denies any severe chest pain.  But does begin to have chest pressure when she becomes upset.  She has not required nitroglycerin sublingual.  She is medically compliant.  She does complain of some mild myalgias and is concerned that her statin may be causing this.  The patient works as a Building control surveyormortgage broker, but is out on FMLA at this time and has begun to take some walks and become more active hospitalization is normally very sedentary at her job.  She is working with her [employer] and does have a case number concerning FMLA and she is requesting the chart be faxed to her case worker to verify that she is been compliant with follow-up appointments.  Past Medical History:  Diagnosis Date  . Anxiety   . CAD (coronary artery disease) 2006   CABG w/ LIMA-LAD, RIMA-Diag, SVG-OM1-OM2, R radial-PDA  . 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   Qualifier: Diagnosis of  By: Cheri GuppyAlleyne, Tiffany    . Diabetes  mellitus type 2 in nonobese (HCC) 07/2016  . Dyslipidemia   . Headache(784.0)   . HTN (hypertension)   . Hyperlipidemia   . Migraine     Past Surgical History:  Procedure Laterality Date  . BUNIONECTOMY  08/2011   right foot  . CARDIAC CATHETERIZATION  2007   severe native 3 v dz, all grafts patent (LIMA-LAD, RIMA-Diag, SVG-OM1-OM2, R radial-PDA)  . CESAREAN SECTION  1984  . CORONARY ARTERY BYPASS GRAFT  2006    Coronary artery bypass grafting x5 with a left  internal  mammary to the left anterior descending coronary artery.  Free right  internal mammary to the diagonal coronary artery, sequential reverse  saphenous vein graft to the first and second obtuse marginal, right  artery bypass to the posterior descending coronary artery with endo-vein harvesting.     Current Outpatient Medications  Medication Sig Dispense Refill  . aspirin 81 MG chewable tablet Chew 81 mg by mouth daily.    Marland Kitchen. glucose blood (ACCU-CHEK GUIDE) test strip Use to check blood sugar once a day.  DX  E11.65 100 each 1  . metFORMIN (GLUCOPHAGE) 500 MG tablet Take 1 tablet (500 mg total) by mouth every evening. 30 tablet 0  . metoprolol succinate (TOPROL-XL) 25 MG 24 hr tablet TAKE 1 TABLET (25 MG TOTAL) BY MOUTH DAILY. 90 tablet 1  . simvastatin (ZOCOR) 80 MG tablet TAKE 1 TABLET (80 MG TOTAL) BY MOUTH AT  BEDTIME. (Patient taking differently: 80mg  by mouth every night at bedtime) 90 tablet 3   No current facility-administered medications for this visit.     Allergies:   Patient has no known allergies.    Social History:  The patient  reports that she quit smoking about 4 years ago. Her smoking use included cigarettes. She has a 4.00 pack-year smoking history. She has never used smokeless tobacco. She reports that she does not drink alcohol or use drugs.   Family History:  The patient's family history includes Diabetes in her father and paternal grandmother; Heart attack in her father; Hypertension in her father  and paternal grandmother; Lupus in her mother.    ROS: All other systems are reviewed and negative. Unless otherwise mentioned in H&P    PHYSICAL EXAM: VS:  LMP 11/28/2009  , BMI There is no height or weight on file to calculate BMI. GEN: Well nourished, well developed, in no acute distress  HEENT: normal  Neck: no JVD, carotid bruits, or masses Cardiac: RRR; tachycardic no murmurs, rubs, or gallops,no edema  Respiratory:  clear to auscultation bilaterally, normal work of breathing GI: soft, nontender, nondistended, + BS MS: no deformity or atrophy  Skin: warm and dry, no rash Neuro:  Strength and sensation are intact Psych: Tearful, anxious.  EKG: Not completed during this office visit.  Recent Labs: 09/27/2016: TSH 0.40 07/19/2017: ALT 15; BUN 8; Creatinine, Ser 0.72; Hemoglobin 13.4; Platelets 176; Potassium 3.6; Sodium 143    Lipid Panel    Component Value Date/Time   CHOL 157 08/17/2016 1225   TRIG 114.0 08/17/2016 1225   HDL 60.60 08/17/2016 1225   CHOLHDL 3 08/17/2016 1225   VLDL 22.8 08/17/2016 1225   LDLCALC 74 08/17/2016 1225   LDLDIRECT 145.2 07/18/2008 0821      Wt Readings from Last 3 Encounters:  07/27/17 141 lb (64 kg)  07/18/17 141 lb (64 kg)  06/30/17 141 lb 6.4 oz (64.1 kg)      Other studies Reviewed: Relevant CV Studies:  ECHO: 07/19/2017 - Left ventricle: The cavity size was normal. There was mild concentric hypertrophy. Systolic function was normal. The estimated ejection fraction was in the range of 55% to 60%. Wall motion was normal; there were no regional wall motion abnormalities. Doppler parameters are consistent with abnormal left ventricular relaxation (grade 1 diastolic dysfunction). Doppler parameters are consistent with indeterminate ventricular filling pressure. - Aortic valve: There was no regurgitation. - Mitral valve: Transvalvular velocity was within the normal range. There was no evidence for stenosis.  There was no regurgitation. - Left atrium: The atrium was mildly dilated. - Right ventricle: The cavity size was normal. Wall thickness was normal. Systolic function was normal. - Tricuspid valve: There was no regurgitation.   MYOVIEW: 12/27/2014  Nuclear stress EF: 74%.  Horizontal ST segment depression ST segment depression of 3 mm was noted during stress in the II, III and aVF leads, and returning to baseline after less than 1 minute of recovery.  This is a low risk study.  The left ventricular ejection fraction is hyperdynamic (>65%). No scintigraphic evidence of ischemia or scar. Good exercise tolerance. Vigorous LV systolic function.  CATH: 06/30/2005 Coronaries: Left main was normal. The LAD had severe diffuse proximal disease with focal 95% stenosis compromising septal perforators. There was long mid 75% stenosis. The LAD distal wrapped the apex. It consisted of two small diagonals. These vessels were free of disease. Circumflex in the AV groove was normal. There was a  small ramus intermediate (referred to as a diagonal in the operative note) that had proximal severe diffuse disease, OM-1 and OM-2 both had proximal severe diffuse disease. All three of these vessels were seen to fill via grafts. The right coronary artery was a long dominant vessel. It had proximal mid long 50% stenosis. There was a distal long 70% stenosis before the PDA. The proximal portion of the PDA had 80% stenosis. The distal vessel was free of high grade disease and seemed to fill via a graft. Grafts: LIMA to the LAD was widely patent. Free RIMA to the PDA was patent, though somewhat narrow in caliber. Saphenous vein graft sequential to OM-1 and OM-2 was widely patent. A free radial appeared to be anastomosed at its proximal segment to the origin of the saphenous vein graft. The distal anastomosis was to the ramus intermediate (diagonal). This was a small  graft but was patent and free of high grade disease. Left ventriculogram: The left ventriculogram was obtained in the RAO projection. EF was 65% with normal wall motion. CONCLUSION: Severe three vessel coronary artery disease. Patent grafts. Well preserved ejection fraction.    ETT: 08/05/2016  Blood pressure demonstrated a normal response to exercise.  There was no ST segment deviation noted during stress. ETT with fair exercise tolerance (7:01); no chest pain; normal BP response; no diagnostic ST changes; negative adequate ETT; Duke treadmill score 7.     ASSESSMENT AND PLAN:  1.  Chest pain: She was ruled out for ACS during recent hospitalization.  The patient now believes this is related to recent extreme stress with the death of her sister and separation from her husband.  I am going to refill her nitroglycerin to use sublingual as needed for recurrent pain.  She will continue antianginal medications to include metoprolol.  Can consider adding Ranexa if she continues to have recurrent discomfort for the short-term.  2.  Coronary artery disease: History of coronary artery bypass grafting in 2006.  She is medically compliant at this time.  She was ruled out for ACS as above  3.  Situational grief and anxiety: Her sister died 2 weeks prior to her appointment and hospitalization, and she and her husband have recently separated.  I have asked her if she would like the names of some grief counselors which may relieve some of her anxiety, provide her support, and hopefully decrease her chest discomfort.  She is very acceptable of this through [facility] or psychiatric counselors who are available and taking new patients.  4.  Diabetes: The patient has stopped taking metformin.  She states it is causing a lot of GI upset and diarrhea.  She is to call her PCP to make a follow-up appointment for alternative medications.  We will also send a staff message to alert them.  5.   Hypercholesterolemia: I have asked her to stop taking her simvastatin for a few days to see if her symptoms are resolved.  May need to consider reducing dose or changing to a different statin if this is indeed causing her symptoms.  I have advised her that if her symptoms do not go away she is to return to taking the medication.  The patient will call us either way to inform us of her current status. Current medicines are reviewed at length with the patient today.    Labs/ tests ordered today include: none  Bettey Mare. Liborio Nixon, ANP, AACC   08/30/2017 9:28 AM    [facility]  [facility] [address], [place], [place] [address] Phone: [phone]; Fax: [phone]

## 2017-08-31 ENCOUNTER — Ambulatory Visit: Payer: 59 | Admitting: Adult Health

## 2017-08-31 ENCOUNTER — Encounter: Payer: Self-pay | Admitting: Adult Health

## 2017-08-31 VITALS — BP 114/68 | HR 93 | Ht 64.0 in | Wt 141.4 lb

## 2017-08-31 DIAGNOSIS — E7800 Pure hypercholesterolemia, unspecified: Secondary | ICD-10-CM

## 2017-08-31 DIAGNOSIS — I10 Essential (primary) hypertension: Secondary | ICD-10-CM

## 2017-08-31 DIAGNOSIS — F4321 Adjustment disorder with depressed mood: Secondary | ICD-10-CM

## 2017-08-31 DIAGNOSIS — I2510 Atherosclerotic heart disease of native coronary artery without angina pectoris: Secondary | ICD-10-CM | POA: Diagnosis not present

## 2017-08-31 NOTE — Patient Instructions (Signed)
Medication Instructions:  Hold simvastatin for a week then re-start to see if it still is making you have muscle ache  If you need a refill on your cardiac medications before your next appointment, please call your pharmacy.  Special Instructions: Please call [facility] for Physician referral at: [phone] -or- [email or web address].  Follow-Up: Your physician wants you to follow-up in: 3 months with Dr Antoine PocheHochrein   [facility] you [facility]!!

## 2017-09-01 ENCOUNTER — Telehealth: Payer: Self-pay | Admitting: Family

## 2017-09-01 MED ORDER — SITAGLIPTIN PHOSPHATE 100 MG PO TABS: 100.0000 mg | ORAL_TABLET | Freq: Every day | ORAL | 5 refills | Status: DC

## 2017-09-01 NOTE — Telephone Encounter (Signed)
Patient states she was using the Januvia- which she liked- but when she was in the hospital they switched her back to metformin which she can not take due to SE- diarrhea. She states she would love to use the Januvia if her PCP is OK with that.

## 2017-09-01 NOTE — Telephone Encounter (Signed)
Received note from cardiology that she is not taking her diabetes medication. Did she try the [place]uv[place]?

## 2017-09-01 NOTE — Telephone Encounter (Signed)
Rx sent for Januvia.

## 2017-09-01 NOTE — Telephone Encounter (Signed)
Attempted to reach pt and left detailed message on voicemail and to call with response.

## 2017-09-02 NOTE — Telephone Encounter (Signed)
Per patient she picked up rx yesterday.

## 2017-09-27 ENCOUNTER — Ambulatory Visit: Payer: 59 | Admitting: Adult Health

## 2017-12-10 NOTE — Progress Notes (Deleted)
HPI The patient presents for followup of a history of coronary artery disease status post CABG x5 in 2006 followup cath in 2007 showed patent grafts and improved LV function EF 65%. She had a normal stress test in May 2012 and again in July 2016 and again in March of 2018.  ***   She continues to get arm and some chest discomfort.  She thinks this is worse than it was last year but similar to the previous discomfort when she had a negative treadmill test.  She gets this at work with emotional stress.  She might have it somewhat at home.  However, she does not get it when she is exercising on the treadmill.  She is able to do that without bringing on any discomfort.  She has no shortness of breath, PND or orthopnea.  She has no palpita   No Known Allergies  Current Outpatient Medications  Medication Sig Dispense Refill  . aspirin 81 MG chewable tablet Chew 81 mg by mouth daily.    Marland Kitchen glucose blood (ACCU-CHEK GUIDE) test strip Use to check blood sugar once a day.  DX  E11.65 100 each 1  . metoprolol succinate (TOPROL-XL) 25 MG 24 hr tablet TAKE 1 TABLET (25 MG TOTAL) BY MOUTH DAILY. 90 tablet 1  . simvastatin (ZOCOR) 80 MG tablet TAKE 1 TABLET (80 MG TOTAL) BY MOUTH AT BEDTIME. (Patient taking differently: 80mg  by mouth every night at bedtime) 90 tablet 3  . sitaGLIPtin (JANUVIA) 100 MG tablet Take 1 tablet (100 mg total) by mouth daily. 30 tablet 5   No current facility-administered medications for this visit.     Past Medical History:  Diagnosis Date  . Anxiety   . CAD (coronary artery disease) 2006   CABG w/ LIMA-LAD, RIMA-Diag, SVG-OM1-OM2, R radial-PDA  . 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   Qualifier: Diagnosis of  By: Cheri Guppy    . Diabetes mellitus type 2 in nonobese (HCC) 07/2016  . Dyslipidemia   . Headache(784.0)   . HTN (hypertension)   . Hyperlipidemia   . Migraine     Past Surgical History:  Procedure Laterality Date  . BUNIONECTOMY  08/2011   right foot  .  CARDIAC CATHETERIZATION  2007   severe native 3 v dz, all grafts patent (LIMA-LAD, RIMA-Diag, SVG-OM1-OM2, R radial-PDA)  . CESAREAN SECTION  1984  . CORONARY ARTERY BYPASS GRAFT  2006    Coronary artery bypass grafting x5 with a left  internal  mammary to the left anterior descending coronary artery.  Free right  internal mammary to the diagonal coronary artery, sequential reverse  saphenous vein graft to the first and second obtuse marginal, right  artery bypass to the posterior descending coronary artery with endo-vein harvesting.    ROS:   ***  PHYSICAL EXAM LMP 11/28/2009   GENERAL:  Well appearing NECK:  No jugular venous distention, waveform within normal limits, carotid upstroke brisk and symmetric, no bruits, no thyromegaly LUNGS:  Clear to auscultation bilaterally CHEST:  Well healed sternotomy scar. HEART:  PMI not displaced or sustained,S1 and S2 within normal limits, no S3, no S4, no clicks, no rubs, *** murmurs ABD:  Flat, positive bowel sounds normal in frequency in pitch, no bruits, no rebound, no guarding, no midline pulsatile mass, no hepatomegaly, no splenomegaly EXT:  2 plus pulses throughout, no edema, no cyanosis no clubbing   *** GENERAL:  Well appearing NECK:  No jugular venous distention, waveform within normal limits, carotid upstroke brisk  and symmetric, no bruits, no thyromegaly LUNGS:  Clear to auscultation bilaterally CHEST:  Unremarkable HEART:  PMI not displaced or sustained,S1 and S2 within normal limits, no S3, no S4, no clicks, no rubs, no murmurs ABD:  Flat, positive bowel sounds normal in frequency in pitch, no bruits, no rebound, no guarding, no midline pulsatile mass, no hepatomegaly, no splenomegaly EXT:  2 plus pulses upper and decreased DP/PT bilateral, no edema, no cyanosis no clubbing   EKG:  Sinus rhythm, rate ***, axis within normal limits, poor anterior R wave progression, no acute ST-T wave changes.   ASSESSMENT AND PLAN   CAD:  ***    The patient has some chest discomfort which has been a chronic complaint over time and somewhat difficult to sort out.  She has had multiple negative stress tests in the past.  At this point I would think this is less likely to be angina rather than her stress or other etiology and will start with a POET (Plain Old Exercise Treadmill).  I will bring her back however for follow up after this if it is negative to see if symptoms are progressing.  If it is abnormal she will need a cardiac cath.   DYSLIPIDEMIA:  ***  Sh e will need a lipid profile when she returns for the POET (Plain Old Exercise Treadmill)  HTN:   The blood pressure is ***  at target. No change in medications is indicated. We will continue with therapeutic lifestyle changes (TLC).  SITUATIONAL DEPRESSION:  ***

## 2017-12-12 ENCOUNTER — Ambulatory Visit: Payer: 59 | Admitting: Cardiology

## 2017-12-13 ENCOUNTER — Ambulatory Visit: Payer: 59 | Admitting: Cardiology

## 2017-12-19 ENCOUNTER — Other Ambulatory Visit: Payer: Self-pay | Admitting: Cardiology

## 2018-01-10 ENCOUNTER — Other Ambulatory Visit: Payer: Self-pay | Admitting: Cardiology

## 2018-01-19 NOTE — Progress Notes (Signed)
HPI The patient presents for followup of a history of coronary artery disease status post CABG x5 in 2006 followup cath in 2007 showed patent grafts and improved LV function EF 65%. She had a normal stress test in May 2012 and again in July 2016 and again in March of 2018.   She had a low risk perfusion study in Feb 2019.   The patient denies any new symptoms such as chest discomfort, neck or arm discomfort. There has been no new shortness of breath, PND or orthopnea. There have been no reported palpitations, presyncope or syncope.    She has been under lots of stress and has anxiety and is being treated for this.  The patient denies any new symptoms such as chest discomfort, neck or arm discomfort. There has been no new shortness of breath, PND or orthopnea. There have been no reported palpitations, presyncope or syncope.   She is exercising on the elliptical and treadmill.     No Known Allergies  Current Outpatient Medications  Medication Sig Dispense Refill  . aspirin 81 MG chewable tablet Chew 81 mg by mouth daily.    Marland Kitchen. glucose blood (ACCU-CHEK GUIDE) test strip Use to check blood sugar once a day.  DX  E11.65 100 each 1  . metoprolol succinate (TOPROL-XL) 25 MG 24 hr tablet TAKE 1 TABLET BY MOUTH EVERY DAY 90 tablet 1  . simvastatin (ZOCOR) 80 MG tablet 80mg  by mouth every night at bedtime 90 tablet 0  . sitaGLIPtin (JANUVIA) 100 MG tablet Take 1 tablet (100 mg total) by mouth daily. 30 tablet 5   No current facility-administered medications for this visit.     Past Medical History:  Diagnosis Date  . Anxiety   . CAD (coronary artery disease) 2006   CABG w/ LIMA-LAD, RIMA-Diag, SVG-OM1-OM2, R radial-PDA  . 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   Qualifier: Diagnosis of  By: Cheri GuppyAlleyne, Tiffany    . Diabetes mellitus type 2 in nonobese (HCC) 07/2016  . Dyslipidemia   . Headache(784.0)   . HTN (hypertension)   . Hyperlipidemia   . Migraine     Past Surgical History:  Procedure Laterality  Date  . BUNIONECTOMY  08/2011   right foot  . CARDIAC CATHETERIZATION  2007   severe native 3 v dz, all grafts patent (LIMA-LAD, RIMA-Diag, SVG-OM1-OM2, R radial-PDA)  . CESAREAN SECTION  1984  . CORONARY ARTERY BYPASS GRAFT  2006    Coronary artery bypass grafting x5 with a left  internal  mammary to the left anterior descending coronary artery.  Free right  internal mammary to the diagonal coronary artery, sequential reverse  saphenous vein graft to the first and second obtuse marginal, right  artery bypass to the posterior descending coronary artery with endo-vein harvesting.    ROS:   As stated in the HPI and negative for all other systems.   PHYSICAL EXAM BP 108/62 (BP Location: Left Arm, Patient Position: Sitting, Cuff Size: Normal)   Pulse 92   Ht 5\' 5"  (1.651 m)   Wt 142 lb (64.4 kg)   LMP 11/28/2009   BMI 23.63 kg/m   GENERAL:  Well appearing NECK:  No jugular venous distention, waveform within normal limits, carotid upstroke brisk and symmetric, no bruits, no thyromegaly LUNGS:  Clear to auscultation bilaterally CHEST:  Unremarkable HEART:  PMI not displaced or sustained,S1 and S2 within normal limits, no S3, no S4, no clicks, no rubs, no murmurs ABD:  Flat, positive bowel sounds normal  in frequency in pitch, no bruits, no rebound, no guarding, no midline pulsatile mass, no hepatomegaly, no splenomegaly EXT:  2 plus pulses throughout, no edema, no cyanosis no clubbing   EKG:  NA.   ASSESSMENT AND PLAN   CAD:     The patient has no new sypmtoms.  No further cardiovascular testing is indicated.  We will continue with aggressive risk reduction and meds as listed.  DYSLIPIDEMIA:   I will order a fasting lipid profile.    HTN:    The blood pressure is at target. No change in medications is indicated. We will continue with therapeutic lifestyle changes (TLC).

## 2018-01-20 ENCOUNTER — Ambulatory Visit: Payer: 59 | Admitting: Cardiology

## 2018-01-20 ENCOUNTER — Encounter: Payer: Self-pay | Admitting: Cardiology

## 2018-01-20 VITALS — BP 108/62 | HR 92 | Ht 65.0 in | Wt 142.0 lb

## 2018-01-20 DIAGNOSIS — I10 Essential (primary) hypertension: Secondary | ICD-10-CM | POA: Diagnosis not present

## 2018-01-20 DIAGNOSIS — Z79899 Other long term (current) drug therapy: Secondary | ICD-10-CM | POA: Diagnosis not present

## 2018-01-20 DIAGNOSIS — I2510 Atherosclerotic heart disease of native coronary artery without angina pectoris: Secondary | ICD-10-CM | POA: Diagnosis not present

## 2018-01-20 DIAGNOSIS — E785 Hyperlipidemia, unspecified: Secondary | ICD-10-CM

## 2018-01-20 NOTE — Patient Instructions (Addendum)
Medication Instructions:  Continue current medications  If you need a refill on your cardiac medications before your next appointment, please call your pharmacy.  Labwork: Your physician recommends that you return for lab work in: Fasting Lipids   Testing/Procedures: None Ordered   Follow-Up: Your physician wants you to follow-up in: 1 Year. You should receive a reminder letter in the mail two months in advance. If you do not receive a letter, please call our office in [phone] to schedule your follow-up appointment.       Thank you for choosing [facility]!!

## 2018-02-06 ENCOUNTER — Telehealth: Payer: Self-pay | Admitting: Cardiology

## 2018-02-06 NOTE — Telephone Encounter (Signed)
02/06/2018 Received FMLA form back from Dr. Antoine Poche, I faxed out paperwork to [employer] mailed copy to to patient an original given to [facility] to go to the scan center.

## 2018-02-25 ENCOUNTER — Other Ambulatory Visit: Payer: Self-pay | Admitting: Family

## 2018-03-17 ENCOUNTER — Other Ambulatory Visit: Payer: Self-pay | Admitting: Cardiology

## 2018-04-03 ENCOUNTER — Other Ambulatory Visit: Payer: Self-pay | Admitting: Family

## 2018-04-12 ENCOUNTER — Ambulatory Visit (INDEPENDENT_AMBULATORY_CARE_PROVIDER_SITE_OTHER): Payer: 59 | Admitting: Mental Health

## 2018-04-12 DIAGNOSIS — F411 Generalized anxiety disorder: Secondary | ICD-10-CM

## 2018-04-12 DIAGNOSIS — F331 Major depressive disorder, recurrent, moderate: Secondary | ICD-10-CM | POA: Diagnosis not present

## 2018-04-12 NOTE — Progress Notes (Signed)
[facility] Counselor Initial Adult Exam- Part I  Name: Bailey PerchesJacqueline Greene Date: 04/12/2018 MRN: [id number] DOB: 03/31/1961 PCP: Bailey Greene, Bailey Greene  Time spent: 45 minutes   Guardian/Payee:Patient    Paperwork requested:  No   Reason for Visit /Presenting Problem:  Daily Panic attacks, anxiety, depression, emotional dysregulation, insomnia, arm pain, stress  Mental Status Exam:   Appearance:   Casual     Behavior:  weeping  Motor:  Restlestness  Speech/Language:   tearful  Affect:  Depressed, Labile, Tearful and anxious  Mood:  anxious, depressed, sad and panicky, stressed  Thought process:  discouraged  Thought content:    negative  Sensory/Perceptual disturbances:    WNL  Orientation:  oriented to person, place and time/date  Attention:  Poor  Concentration:  Poor  Memory:  WNL  Fund of knowledge:   Good  Insight:    Fair  Judgment:   Good  Impulse Control:  Fair   Reported Symptoms:  Panic attacks, Sleep disturbance, Isolation and withdrawal, Fatigue, Physical aches and pain and insominia  Risk Assessment: Danger to Self:  No Self-injurious Behavior: No Danger to Others: No Duty to Warn:no Physical Aggression / Violence:No  Access to Firearms a concern: No  Gang Involvement:No  Patient / guardian was educated about steps to take if suicide or homicide risk level increases between visits: no While future psychiatric events cannot be accurately predicted, the patient does not currently require acute inpatient psychiatric care and does not [facility] criteria.  Substance Abuse History: Current substance abuse: No     Past Psychiatric History:   No previous psychological problems have been observed Outpatient Providers:Bailey Greene, [facility] [place] History of Psych Hospitalization: No  Psychological Testing: unknown    Medical History/Surgical History:not reviewed Past Medical History:  Diagnosis Date  .  Anxiety   . CAD (coronary artery disease) 2006   CABG w/ LIMA-LAD, RIMA-Diag, SVG-OM1-OM2, R radial-PDA  . 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   Qualifier: Diagnosis of  By: Cheri GuppyAlleyne, Tiffany    . Diabetes mellitus type 2 in nonobese (HCC) 07/2016  . Dyslipidemia   . Headache(784.0)   . HTN (hypertension)   . Hyperlipidemia   . Migraine     Past Surgical History:  Procedure Laterality Date  . BUNIONECTOMY  08/2011   right foot  . CARDIAC CATHETERIZATION  2007   severe native 3 v dz, all grafts patent (LIMA-LAD, RIMA-Diag, SVG-OM1-OM2, R radial-PDA)  . CESAREAN SECTION  1984  . CORONARY ARTERY BYPASS GRAFT  2006    Coronary artery bypass grafting x5 with a left  internal  mammary to the left anterior descending coronary artery.  Free right  internal mammary to the diagonal coronary artery, sequential reverse  saphenous vein graft to the first and second obtuse marginal, right  artery bypass to the posterior descending coronary artery with endo-vein harvesting.    Medications: Current Outpatient Medications  Medication Sig Dispense Refill  . aspirin 81 MG chewable tablet Chew 81 mg by mouth daily.    Marland Kitchen. glucose blood (ACCU-CHEK GUIDE) test strip Use to check blood sugar once a day.  DX  E11.65 100 each 1  . metoprolol succinate (TOPROL-XL) 25 MG 24 hr tablet TAKE 1 TABLET BY MOUTH EVERY DAY 90 tablet 1  . simvastatin (ZOCOR) 80 MG tablet TAKE 1 TABLET BY MOUTH AT BEDTIME 90 tablet 3  . sitaGLIPtin (JANUVIA) 100 MG tablet Take 1 tablet (100 mg total) by mouth daily. 30 tablet 0  No current facility-administered medications for this visit.     No Known Allergies  Diagnoses:    ICD-10-CM   1. Generalized anxiety disorder F41.1      Part II to be continued at next session:   No.   Ulice Bold, LPC

## 2018-04-18 ENCOUNTER — Ambulatory Visit: Payer: 59 | Admitting: Psychiatry

## 2018-04-18 DIAGNOSIS — F411 Generalized anxiety disorder: Secondary | ICD-10-CM | POA: Diagnosis not present

## 2018-04-18 DIAGNOSIS — F329 Major depressive disorder, single episode, unspecified: Secondary | ICD-10-CM | POA: Diagnosis not present

## 2018-04-18 DIAGNOSIS — F32A Depression, unspecified: Secondary | ICD-10-CM

## 2018-04-18 MED ORDER — DULOXETINE HCL 30 MG PO CPEP: ORAL_CAPSULE | ORAL | 1 refills | Status: DC
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
## 2018-04-24 ENCOUNTER — Ambulatory Visit: Payer: 59 | Admitting: Psychiatry

## 2018-04-25 ENCOUNTER — Ambulatory Visit (INDEPENDENT_AMBULATORY_CARE_PROVIDER_SITE_OTHER): Payer: 59 | Admitting: Mental Health

## 2018-04-25 ENCOUNTER — Encounter: Payer: Self-pay | Admitting: Mental Health

## 2018-04-25 DIAGNOSIS — F331 Major depressive disorder, recurrent, moderate: Secondary | ICD-10-CM

## 2018-04-25 DIAGNOSIS — F411 Generalized anxiety disorder: Secondary | ICD-10-CM | POA: Diagnosis not present
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
## 2018-05-01 ENCOUNTER — Telehealth: Payer: Self-pay | Admitting: Mental Health

## 2018-05-01 NOTE — Telephone Encounter (Signed)
Pt. Called to talk with you about your alprazalam idea.  She had bad panic attacks over the weekend.  Please call

## 2018-05-03 ENCOUNTER — Other Ambulatory Visit: Payer: Self-pay | Admitting: Psychiatry

## 2018-05-03 MED ORDER — ALPRAZOLAM 0.5 MG PO TABS: ORAL_TABLET | ORAL | 0 refills | Status: DC

## 2018-05-03 NOTE — Progress Notes (Signed)
Pt here for st disability forms. Also desires increase in xanax. Xanax .5mg  1-2 prn up to twice a day. #120

## 2018-05-04 ENCOUNTER — Other Ambulatory Visit: Payer: Self-pay

## 2018-05-04 ENCOUNTER — Telehealth: Payer: Self-pay | Admitting: Psychiatry

## 2018-05-04 MED ORDER — ALPRAZOLAM 1 MG PO TABS: ORAL_TABLET | ORAL | 0 refills | Status: DC

## 2018-05-04 NOTE — Telephone Encounter (Signed)
Need the Disability paperwork completed today! Please call with the status of completion. Thanks.

## 2018-05-04 NOTE — Telephone Encounter (Signed)
Nurse to call Forms completed and faxed today.

## 2018-05-04 NOTE — Telephone Encounter (Signed)
Pt aware.

## 2018-05-11 ENCOUNTER — Ambulatory Visit: Payer: 59 | Admitting: Psychiatry

## 2018-05-11 DIAGNOSIS — F411 Generalized anxiety disorder: Secondary | ICD-10-CM

## 2018-05-11 DIAGNOSIS — F32A Depression, unspecified: Secondary | ICD-10-CM

## 2018-05-11 DIAGNOSIS — F329 Major depressive disorder, single episode, unspecified: Secondary | ICD-10-CM

## 2018-05-11 MED ORDER — DULOXETINE HCL 60 MG PO CPEP: 60.0000 mg | ORAL_CAPSULE | Freq: Every day | ORAL | 0 refills | Status: DC

## 2018-05-11 MED ORDER — ALPRAZOLAM 0.5 MG PO TABS: ORAL_TABLET | ORAL | 0 refills | Status: DC

## 2018-05-11 NOTE — Progress Notes (Addendum)
  05/18/18-old chart(2012) reviewed. Meds tried Depakote, vistaril, remeron,wellbutrin,celexa Dx with mood disorder as had manic sx. Will follow She had a PHQ 9 today.  7 out of 9 items occur nearly every day.    [facility] Med Check  Patient ID: Bailey Greene,  MRN: [id number]  PCP: Sandford Craze'Sullivan, Melissa, NP  Date of Evaluation: 05/11/2018 Time spent:20 minutes  Chief Complaint:   HISTORY/CURRENT STATUS: HPI patient seen initially 04/18/2018.  Diagnosis of increased depression anxiety with panic attacks and insomnia.  Started her on Cymbalta 30 mg a day for a week and then 60 mg a day. Currently she feels things are the same with depression and anxiety. Continues with PA 3/day. Takes xanax 0.5mg /day. Cymbalta is 60mg /day. Continues with personal stressors. Hopeless at times but denies suicidal thoughts.  Individual Medical History/ Review of Systems: Changes? :No   Allergies: Patient has no known allergies.  Current Medications:  Current Outpatient Medications:  .  ALPRAZolam (XANAX) 0.5 MG tablet, 2 tabs for anxiety bid, Disp: 120 tablet, Rfl: 0 .  aspirin 81 MG chewable tablet, Chew 81 mg by mouth daily., Disp: , Rfl:  .  DULoxetine (CYMBALTA) 60 MG capsule, Take 1 capsule (60 mg total) by mouth daily., Disp: 30 capsule, Rfl: 0 .  glucose blood (ACCU-CHEK GUIDE) test strip, Use to check blood sugar once a day.  DX  E11.65, Disp: 100 each, Rfl: 1 .  metoprolol succinate (TOPROL-XL) 25 MG 24 hr tablet, TAKE 1 TABLET BY MOUTH EVERY DAY, Disp: 90 tablet, Rfl: 1 .  simvastatin (ZOCOR) 80 MG tablet, TAKE 1 TABLET BY MOUTH AT BEDTIME, Disp: 90 tablet, Rfl: 3 Medication Side Effects: diarrhea and nausea  Family Medical/ Social History: Changes? No  MENTAL HEALTH EXAM:  Last menstrual period 11/28/2009.There is no height or weight on file to calculate BMI.  General Appearance: Casual  Eye Contact:  Good  Speech:  Clear and Coherent  Volume:  Normal  Mood:  Depressed  Affect:   Appropriate  Thought Process:  Linear  Orientation:  Full (Time, Place, and Person)  Thought Content: WDL   Suicidal Thoughts:  No  Homicidal Thoughts:  No  Memory:  WNL  Judgement:  Good  Insight:  Good  Psychomotor Activity:  Normal  Concentration:  Concentration: Good  Recall:  Good  Fund of Knowledge: Good  Language: Good  Assets:  Desire for Improvement  ADL's:  Intact  Cognition: WNL  Prognosis:  Fair    DIAGNOSES:    ICD-10-CM   1. Anxiety state F41.1   2. Depression, unspecified depression type F32.9     Receiving Psychotherapy: Yes    RECOMMENDATIONS: only on cymbalta 60 mg per day for 3 weeks.  When taking Xanax 0.5 mg once a day.  Past medications she believes the Zoloft and possibly either Prozac or Paxil.  I will continue her on the Cymbalta for now to start BuSpar 15 mg 1/2 tablet twice daily for a week and then 1 tablet twice daily.  She is to increase her Xanax to 0.5 mg to 2 pills 2 times a day Will get old chart to see past meds used. buspar written rx.   Anne Fulay Jazelle Achey, PA-C

## 2018-05-15 ENCOUNTER — Telehealth: Payer: Self-pay | Admitting: Psychiatry

## 2018-05-15 NOTE — Telephone Encounter (Signed)
I have patients 2012 chart. she has had manic episodes in the past.  During these manic times she felt up most of the year.  She also has sudden onset depression.  On occasion patient has been on in the past including Wellbutrin, Celexa, Vistaril, Remeron. We will discuss manic symptoms with patient and revisit.

## 2018-05-16 ENCOUNTER — Ambulatory Visit (INDEPENDENT_AMBULATORY_CARE_PROVIDER_SITE_OTHER): Payer: 59 | Admitting: Mental Health

## 2018-05-16 ENCOUNTER — Encounter: Payer: Self-pay | Admitting: Mental Health

## 2018-05-16 DIAGNOSIS — F411 Generalized anxiety disorder: Secondary | ICD-10-CM | POA: Diagnosis not present

## 2018-05-16 DIAGNOSIS — F331 Major depressive disorder, recurrent, moderate: Secondary | ICD-10-CM

## 2018-05-16 NOTE — Progress Notes (Signed)
      [facility] Counselor/Therapist Progress Note  Patient ID: Bailey PerchesJacqueline Stecher, MRN: [id number],    Date: 05/16/2018  Time Spent: 45 minutes  Treatment Type: Individual Therapy  Reported Symptoms: Depressed mood, Anxious Mood, Panic Attacks, Anhedonia, Sleep disturbance, Appetite disturbance, Irritability, Fatigue, Isolation and withdrawal, Concentration impairments and Sleep disturbance  Mental Status Exam:  Appearance:   Casual     Behavior:  Appropriate and Sharing  Motor:  Normal  Speech/Language:   Normal Rate  Affect:  Tearful and overwhelmed, overburdened, burned out, stressed,xhausted  Mood:  angry, anxious, depressed, irritable, labile and sad  Thought process:  negative, disouraged, aggravated  Thought content:    Obsessions, Rumination and racing, intrusive  Sensory/Perceptual disturbances:    WNL  Orientation:  oriented to person, place and time/date  Attention:  Good  Concentration:  Fair  Memory:  WNL  Fund of knowledge:   Good  Insight:    Good  Judgment:   Good  Impulse Control:  Good   Risk Assessment: Danger to Self:  No Self-injurious Behavior: No Danger to Others: No Duty to Warn:no Physical Aggression / Violence:No  Access to Firearms a concern: No  Gang Involvement:No   Subjective: Depressed and tearful. Anxious and overwhelmed. Marriage is unhappy and non supportive. Feels she is stuck. Sleeping is interrupted and poor appetite. Frantic and worried.  Interventions: Cognitive Behavioral Therapy, Insight-Oriented, Interpersonal and supportive  Diagnosis:   ICD-10-CM   1. Major depressive disorder, recurrent episode, moderate (HCC) F33.1   2. Generalized anxiety disorder F41.1     Plan: Stabilize mood            Restore restful sleep and normal appetite            Self care program            Work/life balance            Pro-active positive choices for well being  Ulice Boldarson Chyrl Elwell, [facility]

## 2018-06-01 ENCOUNTER — Ambulatory Visit (INDEPENDENT_AMBULATORY_CARE_PROVIDER_SITE_OTHER): Payer: 59 | Admitting: Mental Health

## 2018-06-01 ENCOUNTER — Encounter: Payer: Self-pay | Admitting: Mental Health

## 2018-06-01 DIAGNOSIS — F331 Major depressive disorder, recurrent, moderate: Secondary | ICD-10-CM | POA: Diagnosis not present

## 2018-06-01 NOTE — Progress Notes (Signed)
      [facility] Counselor/Therapist Progress Note  Patient ID: Bailey Greene, MRN: [id number],    Date: 06/01/2018  Time Spent: 45 minutes  Treatment Type: Individual Therapy  Reported Symptoms: Anxious, depressed, tearful, nervous about trying to return to work. Sleeping better but  Not entirely restful.   Mental Status Exam:  Appearance:   Casual     Behavior:  tearful, agitated  Motor:  Normal  Speech/Language:   weepy  Affect:  Depressed, Tearful and anxious  Mood:  anxious, depressed and irritable  Thought process:  normal and downcast  Thought content:    Rumination  Sensory/Perceptual disturbances:    WNL  Orientation:  oriented to person, place and time/date  Attention:  Fair  Concentration:  Fair  Memory:  WNL  Fund of knowledge:   Good  Insight:    Fair  Judgment:   Good  Impulse Control:  Good   Risk Assessment: Danger to Self:  No Self-injurious Behavior: No Danger to Others: No Duty to Warn:no Physical Aggression / Violence:No  Access to Firearms a concern: No  Gang Involvement:No   Subjective: Returned from visit to daughter in [place]. Felt better out of town and away from stressors. Told sister she had to move out. Husband is working again and next week husband will start working 2-10 schedule that will allow her some privacy. Job is very stressful and does not feel able to handle it yet.  Interventions: Cognitive Behavioral Therapy, Solution-Oriented/Positive Psychology, Insight-Oriented, Family Systems and Interpersonal  Diagnosis:   ICD-10-CM   1. Major depressive disorder, recurrent episode, moderate (HCC) F33.1     Plan: Restore restful sleep           Return to gym on consistent basis           Recover appetite           Work/ life balance           RTW January 19           RTO 2 weeks  Ulice Bold, [facility]

## 2018-06-05 ENCOUNTER — Encounter: Payer: Self-pay | Admitting: Family

## 2018-06-06 ENCOUNTER — Telehealth: Payer: Self-pay

## 2018-06-06 NOTE — Telephone Encounter (Signed)
[facility] [address] for 90 day duloxetine 60 mg. Pt has f/up 06/08/2018 will give refill then

## 2018-06-08 ENCOUNTER — Ambulatory Visit: Payer: 59 | Admitting: Psychiatry

## 2018-06-08 DIAGNOSIS — F39 Unspecified mood [affective] disorder: Secondary | ICD-10-CM

## 2018-06-08 DIAGNOSIS — F411 Generalized anxiety disorder: Secondary | ICD-10-CM

## 2018-06-08 MED ORDER — ALPRAZOLAM 0.5 MG PO TABS: ORAL_TABLET | ORAL | 0 refills | Status: DC

## 2018-06-08 MED ORDER — DULOXETINE HCL 60 MG PO CPEP: 60.0000 mg | ORAL_CAPSULE | Freq: Every day | ORAL | 0 refills | Status: DC

## 2018-06-08 MED ORDER — BUSPIRONE HCL 15 MG PO TABS: ORAL_TABLET | ORAL | 0 refills | Status: DC

## 2018-06-08 MED ORDER — DIVALPROEX SODIUM ER 500 MG PO TB24: ORAL_TABLET | ORAL | 0 refills | Status: DC

## 2018-06-08 NOTE — Progress Notes (Signed)
[facility] Med Check  Patient ID: Bailey Greene,  MRN: [id number]  PCP: Sandford Craze, NP  Date of Evaluation: 06/08/2018 Time spent:20 minutes  Chief Complaint:   HISTORY/CURRENT STATUS: HPI patient seen 05/11/2018.  She continued to have anxiety and depression.  Also insomnia.  I increased her Xanax continue her Cymbalta 60 mg, and started her on BuSpar.  Review of the old chart shows she has had some manic symptoms. Currently the depression is much better.  She has started back to work.  She still has symptoms of crying spells decreased motivation and energy denies suicidal thoughts. Anxiety and panic attacks only slightly better. Question about mood swings more and she does have up moods when she, talks more, decreased sleep, goal oriented.  This can last a day or 2 and happen several times a week She also has down moods where she will crash it usually lasts a day and it happens 3 times a week down moods may follow-up moods abruptly. Anger she is easily irritated.  She does state she has had mood swings over the years.  Individual Medical History/ Review of Systems: Changes? :No   Allergies: Patient has no known allergies.  Current Medications:  Current Outpatient Medications:  .  ALPRAZolam (XANAX) 0.5 MG tablet, 2 tabs for anxiety bid, Disp: 120 tablet, Rfl: 0 .  DULoxetine (CYMBALTA) 60 MG capsule, Take 1 capsule (60 mg total) by mouth daily., Disp: 30 capsule, Rfl: 0 .  aspirin 81 MG chewable tablet, Chew 81 mg by mouth daily., Disp: , Rfl:  .  busPIRone (BUSPAR) 15 MG tablet, 2 in am and 1 hs for a week Then 2 twice daily, Disp: 120 tablet, Rfl: 0 .  divalproex (DEPAKOTE ER) 500 MG 24 hr tablet, 1 a day for a week then 2/day, Disp: 60 tablet, Rfl: 0 .  glucose blood (ACCU-CHEK GUIDE) test strip, Use to check blood sugar once a day.  DX  E11.65, Disp: 100 each, Rfl: 1 .  metoprolol succinate (TOPROL-XL) 25 MG 24 hr tablet, TAKE 1 TABLET BY MOUTH EVERY DAY, Disp: 90  tablet, Rfl: 1 .  simvastatin (ZOCOR) 80 MG tablet, TAKE 1 TABLET BY MOUTH AT BEDTIME, Disp: 90 tablet, Rfl: 3 Medication Side Effects: none  Family Medical/ Social History: Changes? No  MENTAL HEALTH EXAM:  Last menstrual period 11/28/2009.There is no height or weight on file to calculate BMI.  General Appearance: Casual  Eye Contact:  Good  Speech:  Clear and Coherent  Volume:  Normal  Mood:  Euthymic  Affect:  Appropriate  Thought Process:  Linear  Orientation:  Full (Time, Place, and Person)  Thought Content: Logical   Suicidal Thoughts:  No  Homicidal Thoughts:  No  Memory:  WNL  Judgement:  Good  Insight:  Good  Psychomotor Activity:  Normal  Concentration:  Concentration: Good  Recall:  Good  Fund of Knowledge: Good  Language: Good  Assets:  Desire for Improvement  ADL's:  Intact  Cognition: WNL  Prognosis:  Good    DIAGNOSES:    ICD-10-CM   1. Episodic mood disorder (HCC) F39   2. Anxiety state F41.1     Receiving Psychotherapy: Yes    RECOMMENDATIONS: As mentioned patient's depression is much better however she still having anxiety.  She attributes much of her improvement to BuSpar.  We will increase the BuSpar to 15 mg 2 in the morning 1 at night for a week, then 2 twice daily. She gives history of mood swings.  I am going to start her on Depakote for mood stabilization, Depakote ER 500 mg a day for a week and then 2 tablets  a day.  She may continue the Xanax. I have given her a mood diary to keep.   Anne Fu, PA-C

## 2018-06-12 ENCOUNTER — Telehealth: Payer: Self-pay | Admitting: Psychiatry

## 2018-06-15 ENCOUNTER — Ambulatory Visit: Payer: 59 | Admitting: Mental Health

## 2018-06-21 ENCOUNTER — Other Ambulatory Visit: Payer: Self-pay | Admitting: Psychiatry

## 2018-06-29 ENCOUNTER — Ambulatory Visit (INDEPENDENT_AMBULATORY_CARE_PROVIDER_SITE_OTHER): Payer: 59 | Admitting: Mental Health

## 2018-06-29 ENCOUNTER — Encounter: Payer: Self-pay | Admitting: Mental Health

## 2018-06-29 DIAGNOSIS — F331 Major depressive disorder, recurrent, moderate: Secondary | ICD-10-CM

## 2018-06-29 DIAGNOSIS — F411 Generalized anxiety disorder: Secondary | ICD-10-CM

## 2018-06-29 NOTE — Progress Notes (Signed)
      [facility] Counselor/Therapist Progress Note  Patient ID: Bailey Greene, MRN: [id number],    Date: 06/29/2018  Time Spent: 45 minutes  Treatment Type: Individual Therapy  Reported Symptoms: Depressed mood, Anxious Mood, Panic Attacks, Appetite disturbance and Irritability  Mental Status Exam:  Appearance:   Casual     Behavior:  Appropriate and Sharing  Motor:  Normal  Speech/Language:   Normal Rate  Affect:  Negative, Depressed and Tearful  Mood:  angry, anxious and irritable  Thought process:  normal  Thought content:    Obsessions  Sensory/Perceptual disturbances:    WNL  Orientation:  oriented to person, place and time/date  Attention:  Good  Concentration:  Fair  Memory:  WNL  Fund of knowledge:   Good  Insight:    Good  Judgment:   Good  Impulse Control:  Fair   Risk Assessment: Danger to Self:  No Self-injurious Behavior: No Danger to Others: No Duty to Warn:no Physical Aggression / Violence:No  Access to Firearms a concern: No  Gang Involvement:No   Subjective:  Marriage is conflictual and considering separation. Has been separated before. Crying spells. Panic attacks almost daily. Appetite is still problematic. Sleeps okay with xanax in middle of night. Able to enjoy activities with daughter. Going to [place] in March to see older sister. Work is okay. She is youngest child. Everyone turns to her. Overwhelmed.    Interventions: Cognitive Behavioral Therapy, Solution-Oriented/Positive Psychology, Insight-Oriented and Interpersonal  Diagnosis:   ICD-10-CM   1. Major depressive disorder, recurrent episode, moderate (HCC) F33.1   2. Generalized anxiety disorder F41.1     Plan:   Stabilize mood             Improve appetite             Self care program             Work/life balance             Self comforting techniques             Validation and support  Ulice Boldarson Terresa Marlett, [facility]

## 2018-07-06 ENCOUNTER — Ambulatory Visit: Payer: 59 | Admitting: Psychiatry

## 2018-07-06 DIAGNOSIS — F39 Unspecified mood [affective] disorder: Secondary | ICD-10-CM

## 2018-07-06 MED ORDER — DIVALPROEX SODIUM ER 500 MG PO TB24: ORAL_TABLET | ORAL | 0 refills | Status: DC

## 2018-07-06 MED ORDER — BUSPIRONE HCL 30 MG PO TABS: ORAL_TABLET | ORAL | 0 refills | Status: DC

## 2018-07-06 MED ORDER — MIRTAZAPINE 15 MG PO TABS: ORAL_TABLET | ORAL | 0 refills | Status: DC

## 2018-07-06 NOTE — Progress Notes (Signed)
[facility] Check  Patient ID: Bailey Greene,  MRN: [id number]  PCP: Sandford Craze, NP  Date of Evaluation: 07/06/2018 Time spent:20 minutes  Chief Complaint:   HISTORY/CURRENT STATUS: HPI patient last seen 1 month ago.  She continued to have anxiety and depression.  But the depression is much better.  She has been able to start back to work.  Does have a crying spells.  Her anxiety and panic attacks only slightly better she continues to have mood swings she will have down moods to come right after up moods.  Also has anger.  We increased her BuSpar, started on Depakote ER, and continue Xanax as needed. She has a little bit better she has decreased depression.  She is sleeping better.  Anxiety and panic attacks are about the same.  The anxiety and panic attacks are her main concerns today.  Individual Medical History/ Review of Systems: Changes? :No   Allergies: Patient has no known allergies.  Current Medications:  Current Outpatient Medications:  .  ALPRAZolam (XANAX) 0.5 MG tablet, 2 tabs for anxiety bid, Disp: 120 tablet, Rfl: 0 .  aspirin 81 MG chewable tablet, Chew 81 mg by mouth daily., Disp: , Rfl:  .  busPIRone (BUSPAR) 15 MG tablet, Take 2 tablets in the morning and 1 at bedtime for 1 week, then 2 tablets twice a day, Disp: 120 tablet, Rfl: 0 .  divalproex (DEPAKOTE ER) 500 MG 24 hr tablet, 1 a day for a week then 2/day, Disp: 60 tablet, Rfl: 0 .  DULoxetine (CYMBALTA) 60 MG capsule, Take 1 capsule (60 mg total) by mouth daily., Disp: 30 capsule, Rfl: 0 .  glucose blood (ACCU-CHEK GUIDE) test strip, Use to check blood sugar once a day.  DX  E11.65, Disp: 100 each, Rfl: 1 .  metoprolol succinate (TOPROL-XL) 25 MG 24 hr tablet, TAKE 1 TABLET BY MOUTH EVERY DAY, Disp: 90 tablet, Rfl: 1 .  simvastatin (ZOCOR) 80 MG tablet, TAKE 1 TABLET BY MOUTH AT BEDTIME, Disp: 90 tablet, Rfl: 3 Medication Side Effects: none  Family Medical/ Social History: Changes?no  MENTAL  HEALTH EXAM:  Last menstrual period 11/28/2009.There is no height or weight on file to calculate BMI.  General Appearance: Casual  Eye Contact:  Good  Speech:  Normal Rate  Volume:  Normal  Mood:  Euthymic  Affect:  Appropriate  Thought Process:  Linear  Orientation:  Full (Time, Place, and Person)  Thought Content: Logical   Suicidal Thoughts:  No  Homicidal Thoughts:  No  Memory:  WNL  Judgement:  Good  Insight:  Good  Psychomotor Activity:  Normal  Concentration:  Concentration: Good  Recall:  Good  Fund of Knowledge: Good  Language: Good  Assets:  Desire for Improvement  ADL's:  Intact  Cognition: WNL  Prognosis:  Good    DIAGNOSES: No diagnosis found.  Receiving Psychotherapy: No    RECOMMENDATIONS: We will keep BuSpar 30 mg twice daily, Depakote ER 500 mg 2 a day, Xanax 0.52 tablets twice daily.  To help with anxiety and with the sleep and starting her on Remeron 15 to 30 mg at bedtime.  I gave her a mood diary. She is to return in 4 weeks.   Anne Fu, PA-C

## 2018-07-07 ENCOUNTER — Other Ambulatory Visit: Payer: Self-pay | Admitting: Cardiology

## 2018-07-13 ENCOUNTER — Ambulatory Visit: Payer: 59 | Admitting: Mental Health

## 2018-07-29 ENCOUNTER — Other Ambulatory Visit: Payer: Self-pay | Admitting: Psychiatry

## 2018-07-31 ENCOUNTER — Ambulatory Visit: Payer: 59 | Admitting: Psychiatry

## 2018-07-31 DIAGNOSIS — F411 Generalized anxiety disorder: Secondary | ICD-10-CM | POA: Diagnosis not present

## 2018-07-31 DIAGNOSIS — Z0289 Encounter for other administrative examinations: Secondary | ICD-10-CM

## 2018-07-31 DIAGNOSIS — F339 Major depressive disorder, recurrent, unspecified: Secondary | ICD-10-CM | POA: Diagnosis not present

## 2018-07-31 MED ORDER — MIRTAZAPINE 15 MG PO TABS: ORAL_TABLET | ORAL | 1 refills | Status: DC

## 2018-07-31 MED ORDER — DIVALPROEX SODIUM ER 500 MG PO TB24: ORAL_TABLET | ORAL | 1 refills | Status: DC

## 2018-07-31 MED ORDER — BUSPIRONE HCL 30 MG PO TABS: ORAL_TABLET | ORAL | 1 refills | Status: DC

## 2018-07-31 MED ORDER — ALPRAZOLAM 0.5 MG PO TABS: ORAL_TABLET | ORAL | 1 refills | Status: DC

## 2018-07-31 NOTE — Progress Notes (Signed)
[facility] Check  Patient ID: Bailey Greene,  MRN: [id number]  PCP: Sandford Craze, NP  Date of Evaluation: 07/31/2018 Time spent:20 minutes  Chief Complaint:   HISTORY/CURRENT STATUS: HPI patient seen last 07/06/2018.  Anxiety was the same depression was slightly better.  We had put her on Remeron but that got mixed up so she did not start it. Stress and anxiety are still a big issue both at home and at work.  She is having twice a day panic attacks.  She also continues to have mood swings with up and down down moods last one day , manic sx.last 2 to 3 days.  Sleep is slightly better.  Patient did not start Remeron at last visit.  Individual Medical History/ Review of Systems: Changes? :No   Allergies: Patient has no known allergies.  Current Medications:  Current Outpatient Medications:  .  ALPRAZolam (XANAX) 0.5 MG tablet, 2 tabs for anxiety bid, Disp: 120 tablet, Rfl: 0 .  aspirin 81 MG chewable tablet, Chew 81 mg by mouth daily., Disp: , Rfl:  .  busPIRone (BUSPAR) 30 MG tablet, 1 tab bid, Disp: 60 tablet, Rfl: 0 .  divalproex (DEPAKOTE ER) 500 MG 24 hr tablet, 1 a day for a week then 2/day, Disp: 60 tablet, Rfl: 0 .  glucose blood (ACCU-CHEK GUIDE) test strip, Use to check blood sugar once a day.  DX  E11.65, Disp: 100 each, Rfl: 1 .  metoprolol succinate (TOPROL-XL) 25 MG 24 hr tablet, TAKE 1 TABLET BY MOUTH EVERY DAY, Disp: 90 tablet, Rfl: 1 .  mirtazapine (REMERON) 15 MG tablet, 1-2 tabs hs, Disp: 60 tablet, Rfl: 0 .  simvastatin (ZOCOR) 80 MG tablet, TAKE 1 TABLET BY MOUTH AT BEDTIME, Disp: 90 tablet, Rfl: 3 Medication Side Effects: no  Family Medical/ Social History: Changes? no  MENTAL HEALTH EXAM:  Last menstrual period 11/28/2009.There is no height or weight on file to calculate BMI.  General Appearance: Casual  Eye Contact:  Good  Speech:  Normal Rate  Volume:  Normal  Mood:  Anxious and Depressed  Affect:  Appropriate  Thought Process:  Linear   Orientation:  Full (Time, Place, and Person)  Thought Content: Logical   Suicidal Thoughts:  No  Homicidal Thoughts:  No  Memory:  WNL  Judgement:  Good  Insight:  Good  Psychomotor Activity:  Increased  Concentration:  Concentration: Good  Recall:  Good  Fund of Knowledge: Good  Language: Good  Assets:  Desire for Improvement  ADL's:  Intact  Cognition: WNL  Prognosis:  Good    DIAGNOSES: No diagnosis found.  Receiving Psychotherapy: Yes    RECOMMENDATIONS: Patient is to start Remeron 15 to 30 mg at bedtime this will help with anxiety and depression sleep.  Patient will continue her BuSpar Depakote and Xanax.  Will fill out forms for her accommodations. Return in 1 month.    Anne Fu, PA-C

## 2018-07-31 NOTE — Telephone Encounter (Signed)
HAS OFFICE VISIT TODAY

## 2018-08-01 ENCOUNTER — Ambulatory Visit: Payer: 59 | Admitting: Psychiatry

## 2018-08-08 ENCOUNTER — Ambulatory Visit (INDEPENDENT_AMBULATORY_CARE_PROVIDER_SITE_OTHER): Payer: 59 | Admitting: Mental Health

## 2018-08-08 ENCOUNTER — Ambulatory Visit: Payer: 59 | Admitting: Mental Health

## 2018-08-08 DIAGNOSIS — F331 Major depressive disorder, recurrent, moderate: Secondary | ICD-10-CM | POA: Diagnosis not present

## 2018-08-08 NOTE — Progress Notes (Signed)
      [facility] Counselor/Therapist Progress Note  Patient ID: Bailey Greene, MRN: [id number],    Date: 08/08/2018  Time Spent: 45 minutes  Treatment Type: Individual Therapy  Reported Symptoms:   Mental Status Exam:  Appearance:   Casual     Behavior:  Agitated  Motor:  Normal  Speech/Language:   Normal Rate  Affect:  Depressed  Mood:  anxious and depressed  Thought process:  normal  Thought content:    WNL  Sensory/Perceptual disturbances:    WNL  Orientation:  oriented to person, place and time/date  Attention:  Good  Concentration:  Good  Memory:  WNL  Fund of knowledge:   Good  Insight:    Good  Judgment:   Good  Impulse Control:  Good   Risk Assessment: Danger to Self:  No Self-injurious Behavior: No Danger to Others: No Duty to Warn:no Physical Aggression / Violence:No  Access to Firearms a concern: No  Gang Involvement:No   Subjective:  Stressful job situation at call center. Things are calmer at home with husband. Sleeping well with xanax, though some nightmares. Went to [place] to visit daughter recently. Still having mood swings and manic periods of 2-3 days.  Interventions: Cognitive Behavioral Therapy, Solution-Oriented/Positive Psychology, Insight-Oriented and Interpersonal  Diagnosis:   ICD-10-CM   1. Major depressive disorder, recurrent episode, moderate (HCC) F33.1     Plan: Stabilize mood           Maintain restful sleep, good appetite           Work/Life balances           Self  Comforting techniques  Ulice Bold, LPC

## 2018-08-10 ENCOUNTER — Ambulatory Visit: Payer: 59 | Admitting: Mental Health

## 2018-08-25 ENCOUNTER — Other Ambulatory Visit: Payer: Self-pay | Admitting: Psychiatry

## 2018-08-29 ENCOUNTER — Ambulatory Visit: Payer: 59 | Admitting: Mental Health

## 2018-08-31 ENCOUNTER — Ambulatory Visit: Payer: 59 | Admitting: Psychiatry

## 2018-09-25 ENCOUNTER — Other Ambulatory Visit: Payer: Self-pay

## 2018-09-25 ENCOUNTER — Ambulatory Visit (INDEPENDENT_AMBULATORY_CARE_PROVIDER_SITE_OTHER): Payer: 59 | Admitting: Mental Health

## 2018-09-25 ENCOUNTER — Encounter: Payer: Self-pay | Admitting: Mental Health

## 2018-09-25 DIAGNOSIS — F411 Generalized anxiety disorder: Secondary | ICD-10-CM | POA: Diagnosis not present

## 2018-09-25 DIAGNOSIS — F331 Major depressive disorder, recurrent, moderate: Secondary | ICD-10-CM

## 2018-09-25 NOTE — Progress Notes (Addendum)
[facility] Counselor/Therapist Progress Note  Patient ID: Bailey Greene, MRN: [id number],    Date: 09/25/2018    I connected with patient by a video enabled telemedicine application or telephone, with their informed consent, and verified patient privacy and that I am speaking with the correct person using two identifiers.  I was located at home and patient at home. Corona Virus Pandemic.   Time Spent: 40 minutes  Treatment Type: Individual Therapy  Reported Symptoms: Anxious and stressed. Finding quarantine at home stressful. Working from home. Not sleeping well.  Mental Status Exam:  Appearance:   unseen     Behavior:  Appropriate, Sharing and Agitated  Motor:  Restlestness as reported  Speech/Language:   Clear and Coherent  Affect:  Full Range  Mood:  anxious  Thought process:  normal  Thought content:    WNL  Sensory/Perceptual disturbances:    WNL  Orientation:  oriented to person, place and time/date  Attention:  Good  Concentration:  Good  Memory:  WNL  Fund of knowledge:   Good  Insight:    Good  Judgment:   Good  Impulse Control:  Good   Risk Assessment: Danger to Self:  No Self-injurious Behavior: No Danger to Others: No Duty to Warn:no Physical Aggression / Violence:No  Access to Firearms a concern: No  Gang Involvement:No   Subjective: Has been working from home since mid-March due to [employer] Pandemic. Anxious and stressed. Sister is in the hospital with kidney failure. Talks to her sister twice a day. Trying to work from home and talk to people about their mortgage payments and has heard a lot of hard stories. Upset about the passing of her medical provider [employer]. Feels confined with husband also home from work. Husband employed by [employer]. [place] in [place] is doing well. Not able to sleep at night due to waking up repeatedly.  Interventions: Solution-Oriented/Positive Psychology, Insight-Oriented, Interpersonal and  supportive  Diagnosis:   ICD-10-CM   1. Generalized anxiety disorder F41.1   2. Major depressive disorder, recurrent episode, moderate (HCC) F33.1     Plan:   Treatment Plan   Patient Name: Bailey PerchesJacqueline Greene   Date: September 25, 2018   Didactic topic to be discussed:           Anxiety:                   Locus of control                              Work/Life balance           Depression                             Problem-solving                              Relationships                                   Boundaries                                     Coping srategies  Communication                    Recovery from trauma                    Self-care                                     Validation  Other: Assertive communication     Goals:  Patient  1. Maintains mood stabiity:  decreased symptoms of     depression     anxiety  2.   Practices pro-active self-care:   restful sleep, nutrition, exercise, socialization  3.   Effective utilizes boundaries and sets limits  4.   Utliizes coping strategies and problem solving techniques for stress management  5.   Feels accurately heard, understood and validated  Other:  Improve marital satisfaction      Tenny Craw [facility]     Ulice Bold, [facility]

## 2018-10-02 ENCOUNTER — Telehealth: Payer: Self-pay | Admitting: Psychiatry

## 2018-10-02 ENCOUNTER — Other Ambulatory Visit: Payer: Self-pay

## 2018-10-02 MED ORDER — ALPRAZOLAM 0.5 MG PO TABS: ORAL_TABLET | ORAL | 0 refills | Status: DC

## 2018-10-02 NOTE — Telephone Encounter (Signed)
Former patient of Clay's need refill on [facility] [address] pt has appt. With Dr. Jennelle Human 05/22

## 2018-10-02 NOTE — Telephone Encounter (Signed)
[facility]

## 2018-10-20 ENCOUNTER — Ambulatory Visit: Payer: 59 | Admitting: Psychiatry

## 2018-10-20 ENCOUNTER — Other Ambulatory Visit: Payer: Self-pay

## 2018-10-20 ENCOUNTER — Encounter: Payer: Self-pay | Admitting: Psychiatry

## 2018-10-20 DIAGNOSIS — F4001 Agoraphobia with panic disorder: Secondary | ICD-10-CM | POA: Diagnosis not present

## 2018-10-20 DIAGNOSIS — F3162 Bipolar disorder, current episode mixed, moderate: Secondary | ICD-10-CM | POA: Diagnosis not present

## 2018-10-20 MED ORDER — DIVALPROEX SODIUM ER 500 MG PO TB24: 1500.0000 mg | ORAL_TABLET | Freq: Every day | ORAL | 1 refills | Status: DC

## 2018-10-20 NOTE — Progress Notes (Signed)
Bailey Greene [id number] 1960-10-31 58 y.o.  Virtual Visit via Telephone Note  I connected with@ on 10/20/18 at 10:00 AM EDT by telephone and verified that I am speaking with the correct person using two identifiers.   I discussed the limitations, risks, security and privacy concerns of performing an evaluation and management service by telephone and the availability of in person appointments. I also discussed with the patient that there may be a patient responsible charge related to this service. The patient expressed understanding and agreed to proceed.   I discussed the assessment and treatment plan with the patient. The patient was provided an opportunity to ask questions and all were answered. The patient agreed with the plan and demonstrated an understanding of the instructions.   The patient was advised to call back or seek an in-person evaluation if the symptoms worsen or if the condition fails to improve as anticipated.  I provided 30 minutes of non-face-to-face time during this encounter.  The patient was located at home.  The provider was located at home.   Lauraine Rinne, MD   Subjective:   Patient ID:  Bailey Greene is a 58 y.o. (DOB [birth year]) female.  Chief Complaint:  Chief Complaint  Patient presents with  . Follow-up    Medication Management  . Anxiety  . Manic Behavior    HPI Bailey Greene presents for follow-up of bipolar disorder and panic attacks and general anxiety.  Last seen July 31, 2018.  Mirtazapine 15 to 30 mg at bedtime was added to help with anxiety, depression, and sleep.  She was continued on buspirone, Depakote, alprazolam.  PD MP only shows Xanax.  Worse with more anxiety with a lot of stress.  Seeing therapist and that helps.  More panic than worry.  Feels SOB.  Can be out of the blue or stress.  Some avoidance issues with H. Crying spells can affect work.  Lately the anxiety seems worse than the depression but she does have  rapid mood swings and cycling including anger and irritability outbursts that last a day or 2.  These can be associated with reduced need for sleep and racing thoughts and hyperactivity.  She denies abusing substances  Past Psychiatric Medication Trials: Hydroxyzine, mirtazapine, Depakote, Xanax, buspirone, duloxetine, citalopram, Wellbutrin Notes and chart were reviewed with the patient regarding prior history and symptoms. Review of Systems:  Review of Systems  Gastrointestinal: Negative for diarrhea and nausea.  Neurological: Negative for tremors and weakness.    Medications: I have reviewed the patient's current medications.  Current Outpatient Medications  Medication Sig Dispense Refill  . ALPRAZolam (XANAX) 0.5 MG tablet 2 tabs for anxiety bid 120 tablet 0  . aspirin 81 MG chewable tablet Chew 81 mg by mouth daily.    . busPIRone (BUSPAR) 30 MG tablet TAKE 1 TABLET BY MOUTH TWICE A DAY 180 tablet 0  . divalproex (DEPAKOTE ER) 500 MG 24 hr tablet 2 a day (Patient taking differently: Take 1,000 mg by mouth daily. ) 60 tablet 1  . glucose blood (ACCU-CHEK GUIDE) test strip Use to check blood sugar once a day.  DX  E11.65 100 each 1  . metoprolol succinate (TOPROL-XL) 25 MG 24 hr tablet TAKE 1 TABLET BY MOUTH EVERY DAY 90 tablet 1  . mirtazapine (REMERON) 15 MG tablet 1-2 tabs hs 60 tablet 1  . simvastatin (ZOCOR) 80 MG tablet TAKE 1 TABLET BY MOUTH AT BEDTIME 90 tablet 3   No current facility-administered medications for this  visit.     Medication Side Effects: None  Allergies: No Known Allergies  Past Medical History:  Diagnosis Date  . Anxiety   . CAD (coronary artery disease) 2006   CABG w/ LIMA-LAD, RIMA-Diag, SVG-OM1-OM2, R radial-PDA  . 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   Qualifier: Diagnosis of  By: Cheri GuppyAlleyne, Tiffany    . Diabetes mellitus type 2 in nonobese (HCC) 07/2016  . Dyslipidemia   . Headache(784.0)   . HTN (hypertension)   . Hyperlipidemia   . Migraine      Family History  Problem Relation Age of Onset  . Lupus Mother   . Heart attack Father   . Diabetes Father   . Hypertension Father   . Diabetes Paternal Grandmother   . Hypertension Paternal Grandmother   . Stroke Neg Hx   . Kidney disease Neg Hx   . Hyperlipidemia Neg Hx   . Sudden death Neg Hx     Social History   Socioeconomic History  . Marital status: Legally Separated    Spouse name: Not on file  . Number of children: Not on file  . Years of education: Not on file  . Highest education level: Not on file  Occupational History  . Not on file  Social Needs  . Financial resource strain: Not on file  . Food insecurity:    Worry: Not on file    Inability: Not on file  . Transportation needs:    Medical: Not on file    Non-medical: Not on file  Tobacco Use  . Smoking status: Current Some Day Smoker    Packs/day: 0.50    Years: 8.00    Pack years: 4.00    Types: Cigarettes    Last attempt to quit: 12/25/2012    Years since quitting: 5.8  . Smokeless tobacco: Never Used  Substance and Sexual Activity  . Alcohol use: No    Alcohol/week: 0.0 standard drinks  . Drug use: Never  . Sexual activity: Yes    Partners: Male    Comment: married  Lifestyle  . Physical activity:    Days per week: Not on file    Minutes per session: Not on file  . Stress: Not on file  Relationships  . Social connections:    Talks on phone: Not on file    Gets together: Not on file    Attends religious service: Not on file    Active member of club or organization: Not on file    Attends meetings of clubs or organizations: Not on file    Relationship status: Not on file  . Intimate partner violence:    Fear of current or ex partner: Not on file    Emotionally abused: Not on file    Physically abused: Not on file    Forced sexual activity: Not on file  Other Topics Concern  . Not on file  Social History Narrative   Regular exercise:  3 days weekly   Caffeine Use:  1 soda daily    One child biological daughter born in [id number] and an adopted niece.   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           Past Medical History, Surgical history, Social history, and Family history were reviewed and updated as appropriate.   Please see review of systems for further details on the patient's review from today.   Objective:   Physical Exam:  LMP 11/28/2009   Physical Exam Neurological:  Mental Status: She is alert and oriented to person, place, and time.     Cranial Nerves: No dysarthria.  Psychiatric:        Attention and Perception: Attention normal.        Mood and Affect: Mood is anxious. Affect is labile.        Speech: Speech normal.        Behavior: Behavior is cooperative.        Thought Content: Thought content normal. Thought content is not paranoid or delusional. Thought content does not include homicidal or suicidal ideation. Thought content does not include homicidal or suicidal plan.        Cognition and Memory: Cognition and memory normal.        Judgment: Judgment normal.     Comments: Insight fair.  She describes rapid mood swings throughout the week and panic attacks.     Lab Review:     Component Value Date/Time   NA 143 07/19/2017 0331   NA 141 08/13/2016 1047   K 3.6 07/19/2017 0331   CL 108 07/19/2017 0331   CO2 24 07/19/2017 0331   GLUCOSE 122 (H) 07/19/2017 0331   BUN 8 07/19/2017 0331   BUN 11 08/13/2016 1047   CREATININE 0.72 07/19/2017 0331   CREATININE 0.72 06/22/2012 0840   CALCIUM 9.6 07/19/2017 0331   PROT 6.0 (L) 07/19/2017 0331   PROT 6.8 08/13/2016 1047   ALBUMIN 3.7 07/19/2017 0331   ALBUMIN 4.6 08/13/2016 1047   AST 18 07/19/2017 0331   ALT 15 07/19/2017 0331   ALKPHOS 98 07/19/2017 0331   BILITOT 1.3 (H) 07/19/2017 0331   BILITOT 0.3 08/13/2016 1047   GFRNONAA >60 07/19/2017 0331   GFRNONAA >89 06/22/2012 0840   GFRAA >60 07/19/2017 0331   GFRAA >89 06/22/2012 0840        Component Value Date/Time   WBC 7.7 07/19/2017 0331   RBC 4.44 07/19/2017 0331   HGB 13.4 07/19/2017 0331   HCT 39.3 07/19/2017 0331   PLT 176 07/19/2017 0331   MCV 88.5 07/19/2017 0331   MCH 30.2 07/19/2017 0331   MCHC 34.1 07/19/2017 0331   RDW 13.9 07/19/2017 0331   LYMPHSABS 2.7 06/22/2012 0840   MONOABS 0.5 06/22/2012 0840   EOSABS 0.1 06/22/2012 0840   BASOSABS 0.0 06/22/2012 0840    No results found for: POCLITH, LITHIUM   No results found for: PHENYTOIN, PHENOBARB, VALPROATE, CBMZ   .res Assessment: Plan:    Moderate mixed bipolar I disorder (HCC)  Panic disorder with agoraphobia   Ms. Moorehead has uncontrolled mood swings throughout the week with rapid very rapid ultra-rapid cycling.  She also describes ongoing panic attacks some of which are precipitated by chronic ongoing stress and some occur out of the blue.  Her symptoms affect her both at home and at work.  The Depakote should be doing a better job of controlling the mood swings.  Early on she had nausea and diarrhea with the medication but that has resolved.  We have not obtained laboratory testing lately due to the presence of Covid virus and want to minimize her exposure.  Given the absence of side effects at this time and ongoing symptoms the logical next step is to increase the Depakote.   Increase Depakote to 1500 at hs.  It was explained to her that in the context of rapid cycling we typically have to control the mood cycling before were going to be able to manage the anxiety.  She continued to use the Xanax as needed for panic attacks.  Hopefully the Depakote will help with the panic attacks.  If we can control the rapid cycling then perhaps she could tolerate a low-dose of an SSRI to stop the panic though those can trigger rapid cycling.  That is the reason we are not starting an SSRI at this time.  Alternatives for ins anxiety and panic could include switching to carbamazepine, clonidine, propranolol among  others  We will continue the buspirone and mirtazapine the buspirone is not particularly effective for panic.  Week we will reevaluate those medications as we make progress with the mood cycling.  She has FMLA for him intermittent leave due to panic attacks.  Follow-up 4 weeks  Meredith Staggers MD, DFAPA  Please see After Visit Summary for patient specific instructions.  No future appointments.  No orders of the defined types were placed in this encounter.     -------------------------------

## 2018-11-02 ENCOUNTER — Ambulatory Visit: Payer: 59 | Admitting: Mental Health

## 2018-11-02 ENCOUNTER — Encounter: Payer: Self-pay | Admitting: Mental Health

## 2018-11-02 ENCOUNTER — Other Ambulatory Visit: Payer: Self-pay

## 2018-11-02 DIAGNOSIS — F411 Generalized anxiety disorder: Secondary | ICD-10-CM

## 2018-11-02 DIAGNOSIS — F331 Major depressive disorder, recurrent, moderate: Secondary | ICD-10-CM | POA: Diagnosis not present

## 2018-11-02 NOTE — Progress Notes (Signed)
[facility] Counselor/Therapist Progress Note  Patient ID: Carre Berezin, MRN: [id number],    Date: 11/02/2018  Time Spent: 45 minutes  Treatment Type: Individual Therapy  Reported Symptoms: depression, discouragement, anxiety, frustration  Mental Status Exam:  Appearance:   Casual     Behavior:  Rigid  Motor:  Normal  Speech/Language:   Negative  Affect:  Depressed and Tearful  Mood:  anxious, depressed, irritable and sad  Thought process:  normal  Thought content:    worry  Sensory/Perceptual disturbances:    WNL  Orientation:  oriented to person, place and time/date  Attention:  Good  Concentration:  Good  Memory:  WNL  Fund of knowledge:   Good  Insight:    Good  Judgment:   Good  Impulse Control:  Good   Risk Assessment: Danger to Self:  No Self-injurious Behavior: No Danger to Others: No Duty to Warn:no Physical Aggression / Violence:No  Access to Firearms a concern: No  Gang Involvement:No   Subjective: Very stressful situation at home. Also working from home. Sister is seriously ill with heart and kidney issues losing weight at alarming rate and staying with her. Has some issues with job currently. Difficulty falling asleep. Sleep interrupted. Hard to quieten mind at night so worried.  Interventions: Solution-Oriented/Positive Psychology, Insight-Oriented, Family Systems, Interpersonal and supportive  Diagnosis:   ICD-10-CM   1. Generalized anxiety disorder F41.1   2. Major depressive disorder, recurrent episode, moderate (HCC) F33.1       Treatment Plan   Patient Name:   Date:   Didactic topic to be discussed:           Anxiety:                   Locus of control                              Work/Life balance           Depression                             Problem-solving                              Relationships                                   Boundaries                                     Coping srategies                              Communication                    Recovery from trauma                    Self-care                                     Validation  Other     Goals:  Patient  1. Maintains mood stabiity:  decreased symptoms of     depression     anxiety  2.   Practices pro-active self-care:   restful sleep, nutrition, exercise, socialization  3.   Effective utilizes boundaries and sets limits  4.   Utliizes coping strategies and problem solving techniques for stress management  5.   Feels accurately heard, understood and validated  Other      Tenny CrawCarson M. Sharaya Boruff [facility]     Ulice Boldarson Kailer Heindel, [facility]

## 2018-11-17 ENCOUNTER — Other Ambulatory Visit: Payer: Self-pay

## 2018-11-17 MED ORDER — BUSPIRONE HCL 30 MG PO TABS: ORAL_TABLET | ORAL | 0 refills | Status: DC

## 2018-11-20 ENCOUNTER — Other Ambulatory Visit: Payer: Self-pay

## 2018-11-20 ENCOUNTER — Ambulatory Visit (INDEPENDENT_AMBULATORY_CARE_PROVIDER_SITE_OTHER): Payer: 59 | Admitting: Mental Health

## 2018-11-20 ENCOUNTER — Encounter (INDEPENDENT_AMBULATORY_CARE_PROVIDER_SITE_OTHER): Payer: Self-pay

## 2018-11-20 ENCOUNTER — Encounter: Payer: Self-pay | Admitting: Mental Health

## 2018-11-20 DIAGNOSIS — F4001 Agoraphobia with panic disorder: Secondary | ICD-10-CM

## 2018-11-20 DIAGNOSIS — F3162 Bipolar disorder, current episode mixed, moderate: Secondary | ICD-10-CM | POA: Diagnosis not present

## 2018-11-20 NOTE — Progress Notes (Signed)
[facility] Counselor/Therapist Progress Note  Patient ID: Gerhard PerchesJacqueline Postel, MRN: [id number],    Date: 11/20/2018  Time Spent: 45 minutes  Treatment Type: Individual Therapy  Reported Symptoms: Panic attacks, major stress, anxiety, depression, loss of appetite, interrupted sleep, upset. Having meltdowns, unable to focus.  Mental Status Exam:  Appearance:   Casual     Behavior:  Agitated  Motor:  Restlestness  Speech/Language:   Pressured  Affect:  Depressed and agtated, tense  Mood:  anxious, depressed, irritable, labile and sad  Thought process:  worried  Thought content:    Rumination  Sensory/Perceptual disturbances:    WNL  Orientation:  oriented to person, place and time/date  Attention:  Fair  Concentration:  Fair  Memory:  WNL  Fund of knowledge:   Good  Insight:    Good  Judgment:   Good  Impulse Control:  Good   Risk Assessment: Danger to Self:  No Self-injurious Behavior: No Danger to Others: No Duty to Warn:no Physical Aggression / Violence:No  Access to Firearms a concern: No  Gang Involvement:No   Subjective: Under extreme pressure. Sister had to be moved in with her because was unable to care for herself. Sister became septic. Sister on dialysis and has dementia. Trying to make arrangement to put sister in assisted living where she will have around the clock care. Annice PihJackie is having panic attacks and unable to do her job for lack of focus and concentration. She and husband have had a conflctual relationship which resulted in his moving out of the house. Has no time to do self care. Depressed and anxious.  Interventions: Solution-Oriented/Positive Psychology, Insight-Oriented, Interpersonal and supportive  Diagnosis:   ICD-10-CM   1. Moderate mixed bipolar I disorder (HCC)  F31.62   2. Panic disorder with agoraphobia  F40.01       Treatment Plan   Patient Name:   Date:   Didactic topic to be discussed:           Anxiety:                    Locus of control                              Work/Life balance           Depression                             Problem-solving                              Relationships                                   Boundaries                                     Coping srategies                             Communication                    Recovery from trauma  Self-care                                     Validation  Other     Goals:  Patient  1. Maintains mood stabiity:  decreased symptoms of     depression     anxiety  2.   Practices pro-active self-care:   restful sleep, nutrition, exercise, socialization  3.   Effective utilizes boundaries and sets limits  4.   Utliizes coping strategies and problem solving techniques for stress management  5.   Feels accurately heard, understood and validated  Other: [employer] of sister      Logan Bores [place], [facility]

## 2018-11-22 ENCOUNTER — Other Ambulatory Visit: Payer: Self-pay

## 2018-11-22 ENCOUNTER — Ambulatory Visit: Payer: 59 | Admitting: Psychiatry

## 2018-11-22 ENCOUNTER — Encounter: Payer: Self-pay | Admitting: Psychiatry

## 2018-11-22 ENCOUNTER — Encounter

## 2018-11-22 DIAGNOSIS — F4001 Agoraphobia with panic disorder: Secondary | ICD-10-CM | POA: Diagnosis not present

## 2018-11-22 DIAGNOSIS — F3162 Bipolar disorder, current episode mixed, moderate: Secondary | ICD-10-CM | POA: Diagnosis not present

## 2018-11-22 DIAGNOSIS — F411 Generalized anxiety disorder: Secondary | ICD-10-CM

## 2018-11-22 DIAGNOSIS — F5105 Insomnia due to other mental disorder: Secondary | ICD-10-CM

## 2018-11-22 MED ORDER — PAROXETINE HCL 20 MG PO TABS: ORAL_TABLET | ORAL | 1 refills | Status: DC

## 2018-11-22 NOTE — Patient Instructions (Signed)
Reduce mirtazapine to 1/2 or 1 tablet for sleep  Start paroxetine for panic

## 2018-11-22 NOTE — Progress Notes (Signed)
Bailey PerchesJacqueline Dierolf [id number] 02/01/1961 58 y.o.   Subjective:   Patient ID:  Bailey Greene is a 58 y.o. (DOB 06/17/1960) female.  Chief Complaint:  Chief Complaint  Patient presents with  . Anxiety    med managment  . Panic Attack    Anxiety Symptoms include decreased concentration and nervous/anxious behavior. Patient reports no nausea.     Bailey PerchesJacqueline Nau presents for follow-up of bipolar disorder and panic attacks and general anxiety.  Recent visit 5/22,2020.  Increased Depakote then to 1500 mg daily.  Boss rec LOA for 4 weeks.  Going through separation is really tough.  Panic attacks at work interfering.  Had this job 10 years.  Likes the job but hard to concentrate and stay focused.  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  Panic incr pulse and SOB, fear, sweating and shakey.  Panic lasts 20 mins and increased frequency.  Several in a day.  Going on for 2 mos but getting worse.  Boss can tell from listening to her calls and drop in production.  Mood swings are better.  Trouble staying asleep.  Caffeine 1 coffee and 1 soda daily.  PD MP only shows Xanax.  Worse with more anxiety with a lot of stress.  Seeing therapist and that helps.  More panic than worry.  Feels SOB.  Can be out of the blue or stress.  Some avoidance issues with H. Crying spells can affect work.  Lately the anxiety seems worse than the depression but she does have rapid mood swings and cycling including anger and irritability outbursts that last a day or 2.  These can be associated with reduced need for sleep and racing thoughts and hyperactivity.  She denies abusing substances  Past Psychiatric Medication Trials: Hydroxyzine, mirtazapine, Depakote, Xanax, buspirone, duloxetine, citalopram, Wellbutrin Notes and chart were reviewed with the patient regarding prior history and symptoms. Review of Systems:  Review of Systems  Gastrointestinal: Negative for diarrhea and nausea.  Neurological:  Negative for tremors and weakness.  Psychiatric/Behavioral: Positive for decreased concentration, dysphoric mood and sleep disturbance. The patient is nervous/anxious.     Medications: I have reviewed the patient's current medications.  Current Outpatient Medications  Medication Sig Dispense Refill  . ALPRAZolam (XANAX) 0.5 MG tablet 2 tabs for anxiety bid 120 tablet 0  . aspirin 81 MG chewable tablet Chew 81 mg by mouth daily.    . busPIRone (BUSPAR) 30 MG tablet TAKE 1 TABLET BY MOUTH TWICE A DAY 180 tablet 0  . divalproex (DEPAKOTE ER) 500 MG 24 hr tablet Take 3 tablets (1,500 mg total) by mouth at bedtime. 90 tablet 1  . metoprolol succinate (TOPROL-XL) 25 MG 24 hr tablet TAKE 1 TABLET BY MOUTH EVERY DAY 90 tablet 1  . mirtazapine (REMERON) 15 MG tablet 1-2 tabs hs 60 tablet 1  . simvastatin (ZOCOR) 80 MG tablet TAKE 1 TABLET BY MOUTH AT BEDTIME 90 tablet 3  . glucose blood (ACCU-CHEK GUIDE) test strip Use to check blood sugar once a day.  DX  E11.65 100 each 1  . PARoxetine (PAXIL) 20 MG tablet 1/2 tablet at night for 6 days then 1 nightly 30 tablet 1   No current facility-administered medications for this visit.     Medication Side Effects: None  Allergies: No Known Allergies  Past Medical History:  Diagnosis Date  . Anxiety   . CAD (coronary artery disease) 2006   CABG w/ LIMA-LAD, RIMA-Diag, SVG-OM1-OM2, R radial-PDA  . 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  Qualifier: Diagnosis of  By: Garen Grams    . Diabetes mellitus type 2 in nonobese ([place]) 07/2016  . Dyslipidemia   . Headache(784.0)   . HTN (hypertension)   . Hyperlipidemia   . Migraine     Family History  Problem Relation Age of Onset  . Lupus Mother   . Heart attack Father   . Diabetes Father   . Hypertension Father   . Diabetes Paternal Grandmother   . Hypertension Paternal Grandmother   . Stroke Neg Hx   . Kidney disease Neg Hx   . Hyperlipidemia Neg Hx   . Sudden death Neg Hx     Social History    Socioeconomic History  . Marital status: Legally Separated    Spouse name: Not on file  . Number of children: Not on file  . Years of education: Not on file  . Highest education level: Not on file  Occupational History  . Not on file  Social Needs  . Financial resource strain: Not on file  . Food insecurity    Worry: Not on file    Inability: Not on file  . Transportation needs    Medical: Not on file    Non-medical: Not on file  Tobacco Use  . Smoking status: Current Some Day Smoker    Packs/day: 0.50    Years: 8.00    Pack years: 4.00    Types: Cigarettes    Last attempt to quit: 12/25/2012    Years since quitting: 5.9  . Smokeless tobacco: Never Used  Substance and Sexual Activity  . Alcohol use: No    Alcohol/week: 0.0 standard drinks  . Drug use: Never  . Sexual activity: Yes    Partners: Male    Comment: married  Lifestyle  . Physical activity    Days per week: Not on file    Minutes per session: Not on file  . Stress: Not on file  Relationships  . Social Herbalist on phone: Not on file    Gets together: Not on file    Attends religious service: Not on file    Active member of club or organization: Not on file    Attends meetings of clubs or organizations: Not on file    Relationship status: Not on file  . Intimate partner violence    Fear of current or ex partner: Not on file    Emotionally abused: Not on file    Physically abused: Not on file    Forced sexual activity: Not on file  Other Topics Concern  . Not on file  Social History Narrative   Regular exercise:  3 days weekly   Caffeine Use:  1 soda daily   One child biological daughter born in 11 and an adopted niece.   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           Past Medical History, Surgical history, Social history, and Family history were reviewed and updated as appropriate.   Please see review of systems for further details on the patient's  review from today.   Objective:   Physical Exam:  LMP 11/28/2009   Physical Exam Constitutional:      General: She is not in acute distress.    Appearance: She is well-developed.  Musculoskeletal:        General: No deformity.  Neurological:     Mental Status: She is alert and oriented to person, place, and  time.     Coordination: Coordination normal.  Psychiatric:        Attention and Perception: She is inattentive. She does not perceive auditory hallucinations.        Mood and Affect: Mood is anxious and depressed. Affect is not labile, blunt, angry or inappropriate.        Speech: Speech normal.        Behavior: Behavior normal.        Thought Content: Thought content normal. Thought content does not include homicidal or suicidal ideation. Thought content does not include homicidal or suicidal plan.        Cognition and Memory: Cognition normal.        Judgment: Judgment normal.     Comments: Insight intact. No auditory or visual hallucinations. No delusions.      Lab Review:     Component Value Date/Time   NA 143 07/19/2017 0331   NA 141 08/13/2016 1047   K 3.6 07/19/2017 0331   CL 108 07/19/2017 0331   CO2 24 07/19/2017 0331   GLUCOSE 122 (H) 07/19/2017 0331   BUN 8 07/19/2017 0331   BUN 11 08/13/2016 1047   CREATININE 0.72 07/19/2017 0331   CREATININE 0.72 06/22/2012 0840   CALCIUM 9.6 07/19/2017 0331   PROT 6.0 (L) 07/19/2017 0331   PROT 6.8 08/13/2016 1047   ALBUMIN 3.7 07/19/2017 0331   ALBUMIN 4.6 08/13/2016 1047   AST 18 07/19/2017 0331   ALT 15 07/19/2017 0331   ALKPHOS 98 07/19/2017 0331   BILITOT 1.3 (H) 07/19/2017 0331   BILITOT 0.3 08/13/2016 1047   GFRNONAA >60 07/19/2017 0331   GFRNONAA >89 06/22/2012 0840   GFRAA >60 07/19/2017 0331   GFRAA >89 06/22/2012 0840       Component Value Date/Time   WBC 7.7 07/19/2017 0331   RBC 4.44 07/19/2017 0331   HGB 13.4 07/19/2017 0331   HCT 39.3 07/19/2017 0331   PLT 176 07/19/2017 0331   MCV 88.5  07/19/2017 0331   MCH 30.2 07/19/2017 0331   MCHC 34.1 07/19/2017 0331   RDW 13.9 07/19/2017 0331   LYMPHSABS 2.7 06/22/2012 0840   MONOABS 0.5 06/22/2012 0840   EOSABS 0.1 06/22/2012 0840   BASOSABS 0.0 06/22/2012 0840    No results found for: POCLITH, LITHIUM   No results found for: PHENYTOIN, PHENOBARB, VALPROATE, CBMZ   .res Assessment: Plan:    Adela LankJacqueline was seen today for anxiety and panic attack.  Diagnoses and all orders for this visit:  Moderate mixed bipolar I disorder (HCC)  Panic disorder with agoraphobia  Generalized anxiety disorder  Insomnia due to mental condition  Other orders -     PARoxetine (PAXIL) 20 MG tablet; 1/2 tablet at night for 6 days then 1 nightly   Greater than 50% of face to face time with patient was spent on counseling and coordination of care. We discussed the following: Ms. Noe Genseters has a little less uncontrolled mood swings throughout the week with rapid very rapid ultra-rapid cycling since the increase in Depakote to 1500 mg.  She also describes ongoing panic attacks some of which are precipitated by chronic ongoing stress and some occur out of the blue.  Her symptoms affect her both at home and at work.  These are severe and her bosses suggest that she take a leave of absence for 30 days to try to get them under better control.  They are affecting her work function.  It was explained to her that  in the context of rapid cycling we typically have to control the mood cycling before were going to be able to manage the anxiety.  She continued to use the Xanax as needed for panic attacks.   Alternatives for ins anxiety and panic could include switching to carbamazepine, clonidine, propranolol among others  Because of the severity of the panic disorder we are going to need to start an SSRI and will start paroxetine 10 mg daily and increase to 20 mg at night.  It was explained to her this could worsen her mood cycling and to let us know if that  occurs.  We will continue the buspirone and mirtazapine the buspirone is not particularly effective for panic.  Week we will reevaluate those medications as we make progress with the mood cycling.   Reduce mirtazapine to 1/2 or 1 tablet for sleep  She has FMLA for him intermittent leave due to panic attacks.  Start FT medical leave today until July 27 for panic disorder.  This appt was 30 mins.   Follow-up 4 weeks  Meredith Staggersarey Cottle MD, DFAPA  Please see After Visit Summary for patient specific instructions.  Future Appointments  Date Time Provider Department Center  11/28/2018  5:00 PM Cottle, Steva Readyarey G Jr., MD [facility] None    No orders of the defined types were placed in this encounter.     -------------------------------

## 2018-11-28 ENCOUNTER — Ambulatory Visit: Payer: 59 | Admitting: Psychiatry

## 2018-12-03 IMAGING — US US THYROID
1 series · 13 of 25 positions shown · non-contrast
Comparison: Prior thyroid ultrasound 07/27/2012 and 10/07/2010

CLINICAL DATA: Goiter. 55-year-old female with a history of
thyromegaly and multiple thyroid nodules

EXAM:
THYROID ULTRASOUND
TECHNIQUE: Ultrasound examination of the thyroid gland and adjacent soft
tissues was performed.

[Series 1: us thyroid · 0.07mm/px · 13 of 43 slices shown]
[im 1/43]
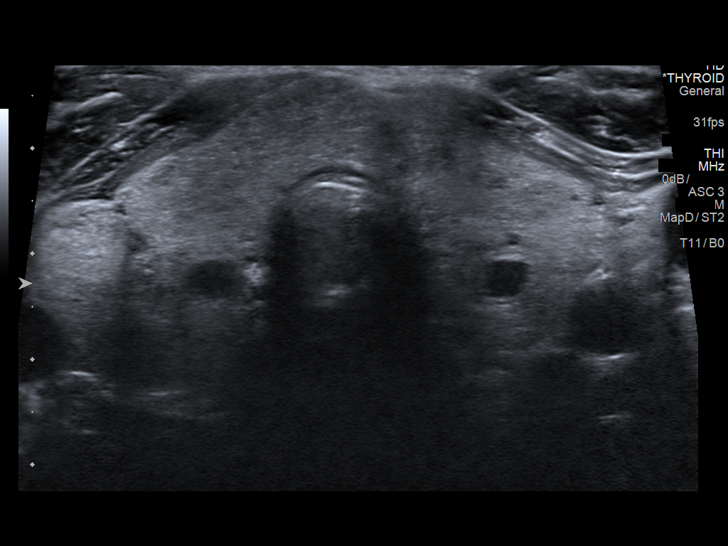
[im 4/43]
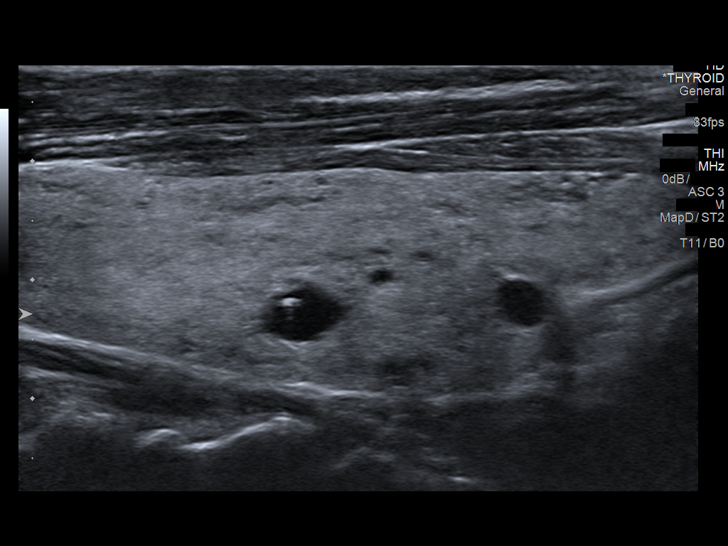
[im 8/43]
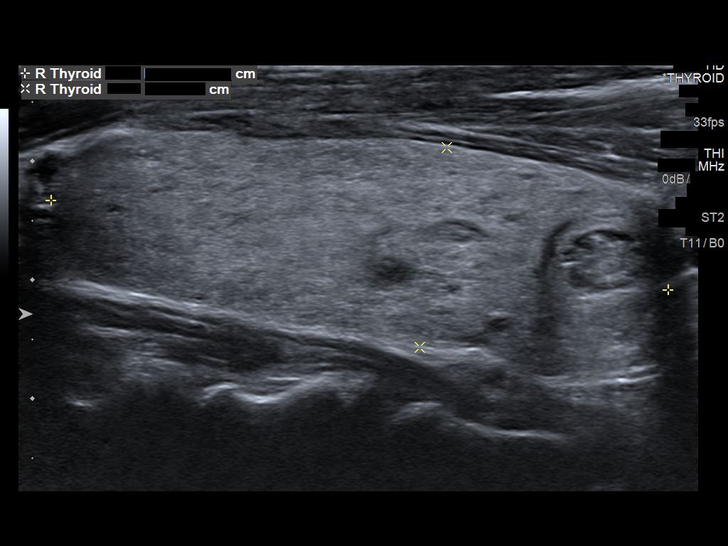
[im 11/43]
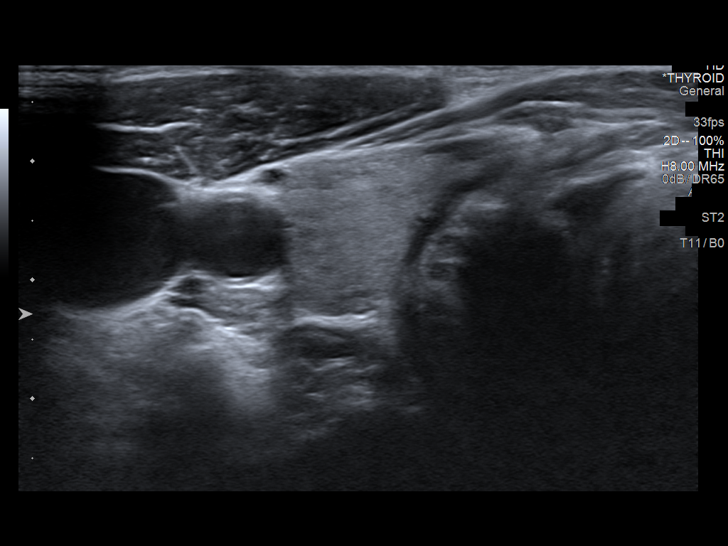
[im 15/43]
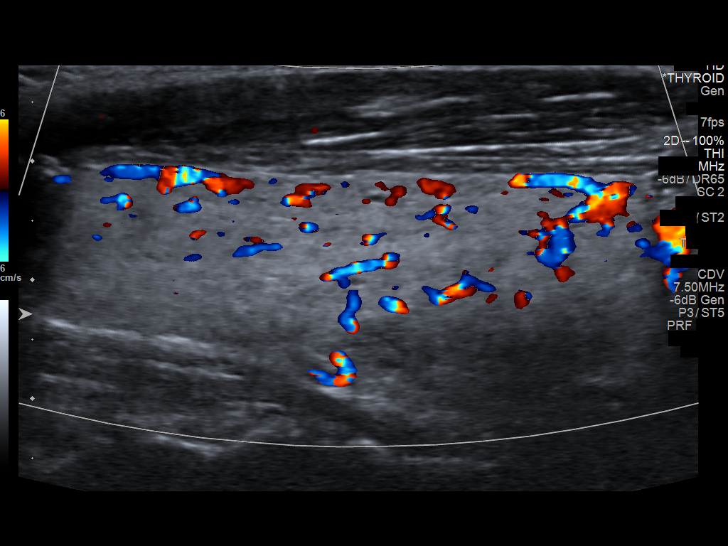
[im 18/43]
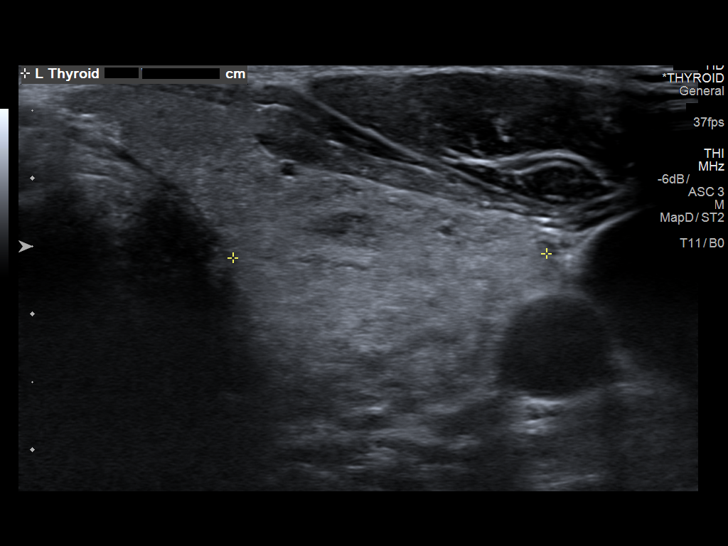
[im 22/43]
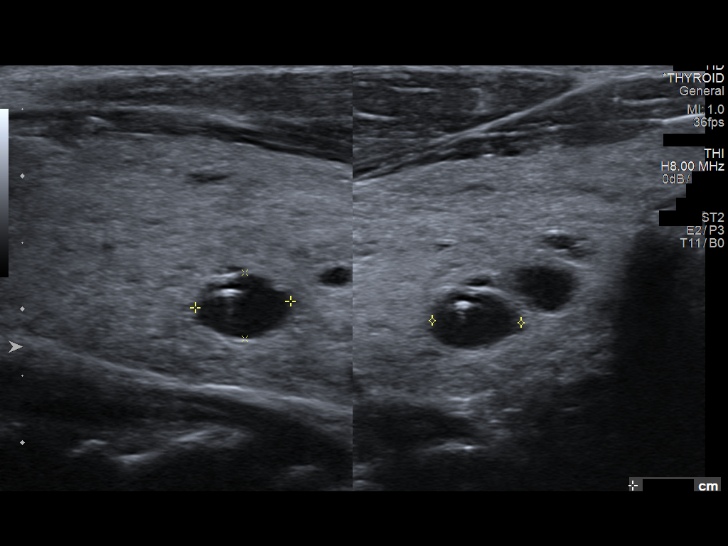
[im 25/43]
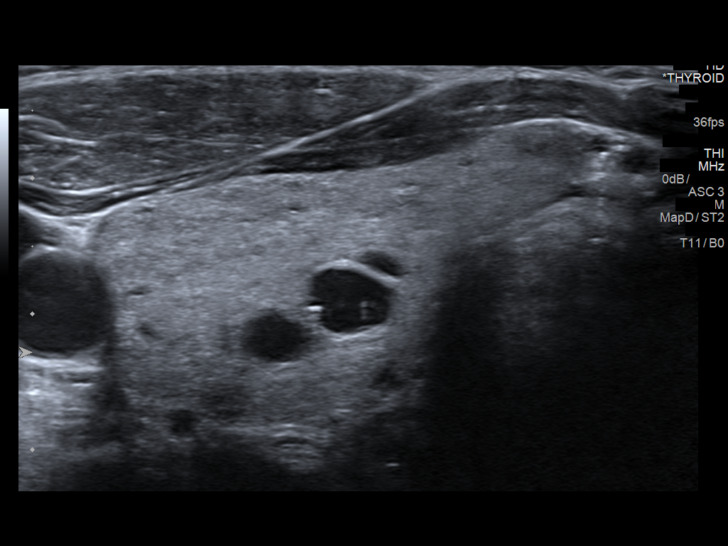
[im 29/43]
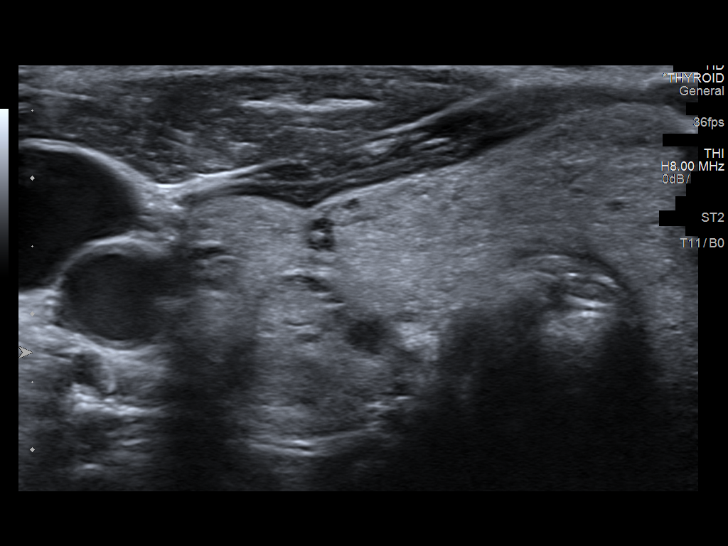
[im 32/43]
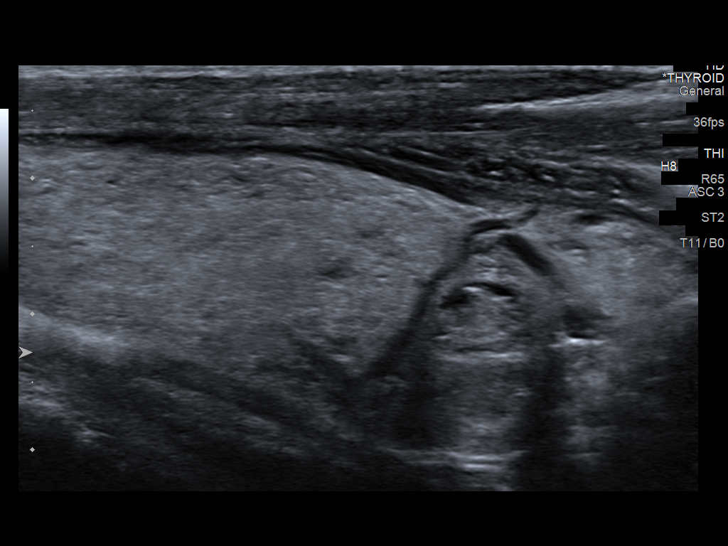
[im 36/43]
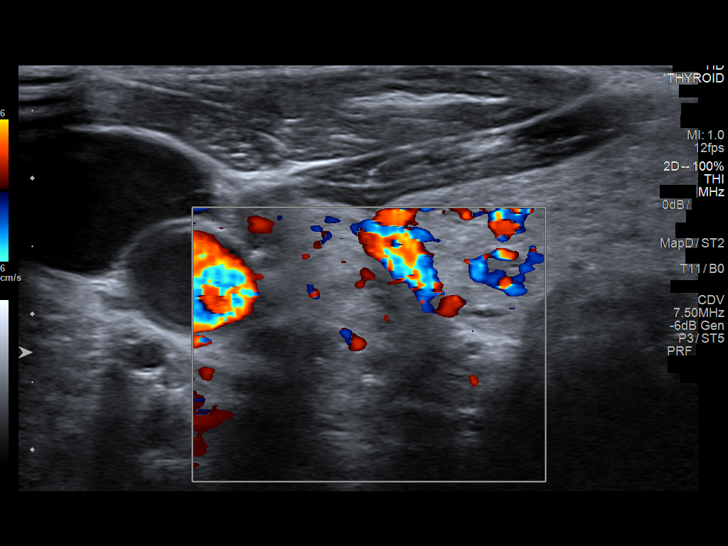
[im 39/43]
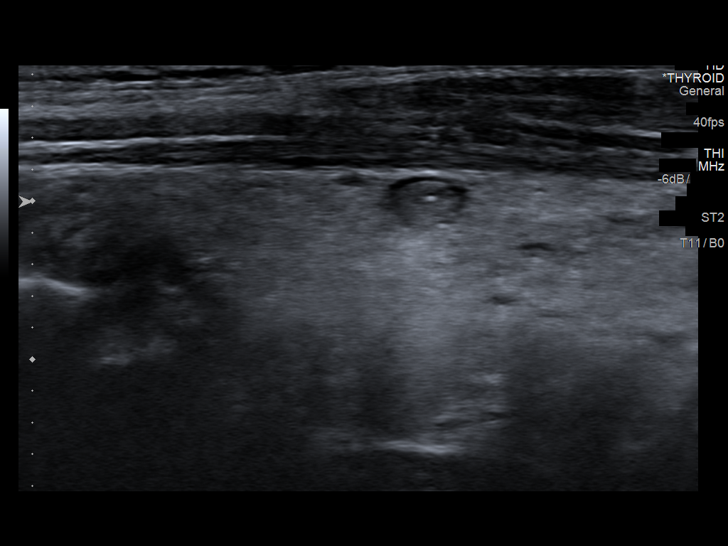
[im 43/43]
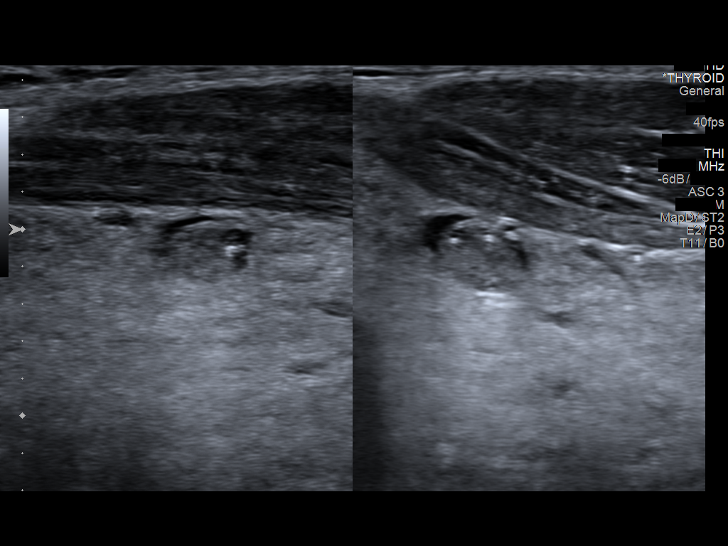

[13 of 25 positions shown; findings below may reference images not displayed]

FINDINGS: Parenchymal Echotexture: Moderately heterogenous

Isthmus: 0.7 cm

Right lobe: 5.3 x 1.7 x 2.6 cm

Left lobe: 5.7 x 1.8 x 2.3 cm

_________________________________________________________

Estimated total number of nodules >/= 1 cm: 0

Number of spongiform nodules >/=  2 cm not described below (TR1): 0

Number of mixed cystic and solid nodules >/= 1.5 cm not described
below (TR2): 0

_________________________________________________________

Numerous small thyroid nodules are noted bilaterally. The majority
of the thyroid nodules are anechoic with colloid artifact consistent
with benign colloid cysts. Additionally, there are benign spongiform
nodules and a single hypoechoic solid nodule which measures less
than 1 cm in maximal size. None of these nodules meet criteria for
biopsy or imaging follow-up.
IMPRESSION: 1. Enlarged and heterogeneous thyroid gland containing multiple
bilateral thyroid nodules which are either benign in appearance, or
of a size that does not meet criteria for either biopsy or dedicated
imaging follow-up.
The above is in keeping with the ACR TI-RADS recommendations - [HOSPITAL] 3882;[DATE].

## 2018-12-07 ENCOUNTER — Ambulatory Visit: Payer: 59 | Admitting: Mental Health

## 2018-12-15 ENCOUNTER — Telehealth: Payer: Self-pay | Admitting: Psychiatry

## 2018-12-15 NOTE — Telephone Encounter (Signed)
Nevin Bloodgood called the Social worker from [employer] stating that Kennyth Lose has a star day back to work of July 28th. However she would like to go back to work gradually. So she wants to do 20 hours for 2 weeks and then 30 hour for a week and then go to 40 hours a week. They need Dr. Clovis Pu 's approval for this to happen. All she needs is for Dr. Clovis Pu or to call and leave a message on her confidential voicemail that it is ok for her to do that. Please call her at [phone]

## 2018-12-15 NOTE — Telephone Encounter (Signed)
Call to ok gradual start

## 2018-12-15 NOTE — Telephone Encounter (Signed)
Left voice mail

## 2018-12-16 ENCOUNTER — Other Ambulatory Visit: Payer: Self-pay | Admitting: Psychiatry

## 2018-12-31 ENCOUNTER — Other Ambulatory Visit: Payer: Self-pay | Admitting: Cardiology

## 2019-01-16 ENCOUNTER — Other Ambulatory Visit: Payer: Self-pay | Admitting: Psychiatry

## 2019-01-17 ENCOUNTER — Other Ambulatory Visit: Payer: Self-pay

## 2019-01-17 MED ORDER — DIVALPROEX SODIUM ER 500 MG PO TB24: 1500.0000 mg | ORAL_TABLET | Freq: Every day | ORAL | 0 refills | Status: DC

## 2019-01-18 ENCOUNTER — Other Ambulatory Visit: Payer: Self-pay

## 2019-01-18 ENCOUNTER — Ambulatory Visit (INDEPENDENT_AMBULATORY_CARE_PROVIDER_SITE_OTHER): Payer: 59 | Admitting: Psychiatry

## 2019-01-18 ENCOUNTER — Encounter: Payer: Self-pay | Admitting: Psychiatry

## 2019-01-18 ENCOUNTER — Encounter

## 2019-01-18 DIAGNOSIS — F3162 Bipolar disorder, current episode mixed, moderate: Secondary | ICD-10-CM

## 2019-01-18 DIAGNOSIS — F411 Generalized anxiety disorder: Secondary | ICD-10-CM | POA: Diagnosis not present

## 2019-01-18 DIAGNOSIS — F4001 Agoraphobia with panic disorder: Secondary | ICD-10-CM | POA: Diagnosis not present

## 2019-01-18 DIAGNOSIS — F5105 Insomnia due to other mental disorder: Secondary | ICD-10-CM | POA: Diagnosis not present

## 2019-01-18 MED ORDER — PAROXETINE HCL 20 MG PO TABS: 20.0000 mg | ORAL_TABLET | Freq: Every day | ORAL | 0 refills | Status: DC

## 2019-01-18 MED ORDER — QUETIAPINE FUMARATE 25 MG PO TABS: 25.0000 mg | ORAL_TABLET | Freq: Every day | ORAL | 0 refills | Status: DC

## 2019-01-18 NOTE — Progress Notes (Signed)
Bailey Greene [id number] 12/29/1960 58 y.o.   Subjective:   Patient ID:  Bailey Greene is a 58 y.o. (DOB 10/30/1960) female.  Chief Complaint:  Chief Complaint  Patient presents with  . Follow-up    Medication Management  . Other    Bipolar  . Anxiety    med changes  . Sleeping Problem    Anxiety Patient reports no decreased concentration, nausea or nervous/anxious behavior.     Bailey Greene presents for follow-up of bipolar disorder and panic attacks and general anxiety.  Recent visit 5/22,2020.  Increased Depakote then to 1500 mg daily.  Boss rec LOA for 4 weeks.  Going through separation is really tough.  Panic attacks at work interfering.  Had this job 10 years.  Likes the job but hard to concentrate and stay focused.  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  Panic incr pulse and SOB, fear, sweating and shakey.  Panic lasts 20 mins and increased frequency.  Several in a day.  Going on for 2 mos but getting worse.  Boss can tell from listening to her calls and drop in production.  At follow up visit November 22, 2018.  Mood swings are better.  Trouble staying asleep.  Caffeine 1 coffee and 1 soda daily.She was still having severe anxiety plus panic.  We discussed the risk of SSRIs trickling triggering mood swings but the severity of the anxiety was such that we decided to initiate fluoxetine 10 mg daily to increase to 20 mg daily.  We also were using low-dose mirtazapine to help with sleep.  Seen improvement.  Initially dizzy and nausea.  Some panic but not like it was.  GI SE resolved.  Trying to push through avoidance.  Able to exercise and walk better and big improvement in motivation.  Don't sleep as much.  To bet 1030 and awaken 330 and can't go back to sleep.  Some drowsiness.  Seldom nap.  Not hyper.  3-4 nights week EMA.  No other sleeper beside mirtazapine.  No NM usually.  Not anxious upon awakening.    RTW July 24 graduated and starting Monday RTW FT and  doing well.  PDMP only shows Xanax.  She denies abusing substances  Past Psychiatric Medication Trials: Hydroxyzine, mirtazapine, Depakote, Xanax, buspirone, duloxetine, citalopram, Wellbutrin Notes and chart were reviewed with the patient regarding prior history and symptoms.  Review of Systems:  Review of Systems  Gastrointestinal: Negative for diarrhea and nausea.  Neurological: Negative for tremors and weakness.  Psychiatric/Behavioral: Positive for sleep disturbance. Negative for decreased concentration and dysphoric mood. The patient is not nervous/anxious.     Medications: I have reviewed the patient's current medications.  Current Outpatient Medications  Medication Sig Dispense Refill  . ALPRAZolam (XANAX) 0.5 MG tablet 2 tabs for anxiety bid 120 tablet 0  . aspirin 81 MG chewable tablet Chew 81 mg by mouth daily.    . busPIRone (BUSPAR) 30 MG tablet TAKE 1 TABLET BY MOUTH TWICE A DAY 180 tablet 0  . divalproex (DEPAKOTE ER) 500 MG 24 hr tablet Take 3 tablets (1,500 mg total) by mouth at bedtime. 270 tablet 0  . glucose blood (ACCU-CHEK GUIDE) test strip Use to check blood sugar once a day.  DX  E11.65 100 each 1  . metoprolol succinate (TOPROL-XL) 25 MG 24 hr tablet TAKE 1 TABLET BY MOUTH EVERY DAY 90 tablet 1  . PARoxetine (PAXIL) 20 MG tablet Take 1 tablet (20 mg total) by mouth daily. 90 tablet  0  . simvastatin (ZOCOR) 80 MG tablet TAKE 1 TABLET BY MOUTH AT BEDTIME 90 tablet 3  . QUEtiapine (SEROQUEL) 25 MG tablet Take 1-2 tablets (25-50 mg total) by mouth at bedtime. 180 tablet 0   No current facility-administered medications for this visit.     Medication Side Effects: ? EMA with Paxil not manic  Allergies: No Known Allergies  Past Medical History:  Diagnosis Date  . Anxiety   . CAD (coronary artery disease) 2006   CABG w/ LIMA-LAD, RIMA-Diag, SVG-OM1-OM2, R radial-PDA  . 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   Qualifier: Diagnosis of  By: Cheri GuppyAlleyne, Tiffany    . Diabetes  mellitus type 2 in nonobese (HCC) 07/2016  . Dyslipidemia   . Headache(784.0)   . HTN (hypertension)   . Hyperlipidemia   . Migraine     Family History  Problem Relation Age of Onset  . Lupus Mother   . Heart attack Father   . Diabetes Father   . Hypertension Father   . Diabetes Paternal Grandmother   . Hypertension Paternal Grandmother   . Stroke Neg Hx   . Kidney disease Neg Hx   . Hyperlipidemia Neg Hx   . Sudden death Neg Hx     Social History   Socioeconomic History  . Marital status: Legally Separated    Spouse name: Not on file  . Number of children: Not on file  . Years of education: Not on file  . Highest education level: Not on file  Occupational History  . Not on file  Social Needs  . Financial resource strain: Not on file  . Food insecurity    Worry: Not on file    Inability: Not on file  . Transportation needs    Medical: Not on file    Non-medical: Not on file  Tobacco Use  . Smoking status: Current Some Day Smoker    Packs/day: 0.50    Years: 8.00    Pack years: 4.00    Types: Cigarettes    Last attempt to quit: 12/25/2012    Years since quitting: 6.0  . Smokeless tobacco: Never Used  Substance and Sexual Activity  . Alcohol use: No    Alcohol/week: 0.0 standard drinks  . Drug use: Never  . Sexual activity: Yes    Partners: Male    Comment: married  Lifestyle  . Physical activity    Days per week: Not on file    Minutes per session: Not on file  . Stress: Not on file  Relationships  . Social Musicianconnections    Talks on phone: Not on file    Gets together: Not on file    Attends religious service: Not on file    Active member of club or organization: Not on file    Attends meetings of clubs or organizations: Not on file    Relationship status: Not on file  . Intimate partner violence    Fear of current or ex partner: Not on file    Emotionally abused: Not on file    Physically abused: Not on file    Forced sexual activity: Not on file   Other Topics Concern  . Not on file  Social History Narrative   Regular exercise:  3 days weekly   Caffeine Use:  1 soda daily   One child biological daughter born in [id number] and an adopted niece.   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  Past Medical History, Surgical history, Social history, and Family history were reviewed and updated as appropriate.   Please see review of systems for further details on the patient's review from today.   Objective:   Physical Exam:  LMP 11/28/2009   Physical Exam Constitutional:      General: She is not in acute distress.    Appearance: She is well-developed.  Musculoskeletal:        General: No deformity.  Neurological:     Mental Status: She is alert and oriented to person, place, and time.     Coordination: Coordination normal.  Psychiatric:        Attention and Perception: She is attentive. She does not perceive auditory hallucinations.        Mood and Affect: Mood is not anxious or depressed. Affect is not labile, blunt, angry or inappropriate.        Speech: Speech normal.        Behavior: Behavior normal.        Thought Content: Thought content normal. Thought content does not include homicidal or suicidal ideation. Thought content does not include homicidal or suicidal plan.        Cognition and Memory: Cognition normal.        Judgment: Judgment normal.     Comments: Insight intact. No auditory or visual hallucinations. No delusions.  Residual anxiety and depression are adequately controlled.     Lab Review:     Component Value Date/Time   NA 143 07/19/2017 0331   NA 141 08/13/2016 1047   K 3.6 07/19/2017 0331   CL 108 07/19/2017 0331   CO2 24 07/19/2017 0331   GLUCOSE 122 (H) 07/19/2017 0331   BUN 8 07/19/2017 0331   BUN 11 08/13/2016 1047   CREATININE 0.72 07/19/2017 0331   CREATININE 0.72 06/22/2012 0840   CALCIUM 9.6 07/19/2017 0331   PROT 6.0 (L) 07/19/2017 0331   PROT 6.8  08/13/2016 1047   ALBUMIN 3.7 07/19/2017 0331   ALBUMIN 4.6 08/13/2016 1047   AST 18 07/19/2017 0331   ALT 15 07/19/2017 0331   ALKPHOS 98 07/19/2017 0331   BILITOT 1.3 (H) 07/19/2017 0331   BILITOT 0.3 08/13/2016 1047   GFRNONAA >60 07/19/2017 0331   GFRNONAA >89 06/22/2012 0840   GFRAA >60 07/19/2017 0331   GFRAA >89 06/22/2012 0840       Component Value Date/Time   WBC 7.7 07/19/2017 0331   RBC 4.44 07/19/2017 0331   HGB 13.4 07/19/2017 0331   HCT 39.3 07/19/2017 0331   PLT 176 07/19/2017 0331   MCV 88.5 07/19/2017 0331   MCH 30.2 07/19/2017 0331   MCHC 34.1 07/19/2017 0331   RDW 13.9 07/19/2017 0331   LYMPHSABS 2.7 06/22/2012 0840   MONOABS 0.5 06/22/2012 0840   EOSABS 0.1 06/22/2012 0840   BASOSABS 0.0 06/22/2012 0840    No results found for: POCLITH, LITHIUM   No results found for: PHENYTOIN, PHENOBARB, VALPROATE, CBMZ   .res Assessment: Plan:    Estell was seen today for follow-up, other, anxiety and sleeping problem.  Diagnoses and all orders for this visit:  Moderate mixed bipolar I disorder (HCC)  Panic disorder with agoraphobia -     PARoxetine (PAXIL) 20 MG tablet; Take 1 tablet (20 mg total) by mouth daily.  Generalized anxiety disorder -     PARoxetine (PAXIL) 20 MG tablet; Take 1 tablet (20 mg total) by mouth daily.  Insomnia due to mental condition -  QUEtiapine (SEROQUEL) 25 MG tablet; Take 1-2 tablets (25-50 mg total) by mouth at bedtime.   Greater than 50% of face to face time with patient was spent on counseling and coordination of care. We discussed the following: Ms. Noe Genseters has a little less uncontrolled mood swings throughout the week with rapid very rapid ultra-rapid cycling since the increase in Depakote to 1500 mg.  She also describes ongoing panic attacks some of which are precipitated by chronic ongoing stress and some occur out of the blue.  Her symptoms affect her both at home and at work.  These are severe and her bosses  suggest that she take a leave of absence for 30 days to try to get them under better control.  They are affecting her work function.  It was explained to her that in the context of rapid cycling we typically have to control the mood cycling before were going to be able to manage the anxiety.  She continued to use the Xanax as needed for panic attacks.   Alternatives for ins anxiety and panic could include switching to carbamazepine, clonidine, propranolol among others  Because of the severity of the panic disorder we  started paroxetine 20 mg at night.  It was explained to her this could worsen her mood cycling and to let us know if that occurs.  Fortunately because no mood swings and her panic and anxiety symptoms are markedly improved.  Good response.    No indication to change Depakote nor paroxetine.  DC mirtazapine DT EMA Quetiapine 25 mg 1-2 mg HS.  Disc SE in detail.   Discussed potential metabolic side effects associated with atypical antipsychotics, as well as potential risk for movement side effects. Advised pt to contact office if movement side effects occur.  Metabolic side effects are unlikely at this low dose of quetiapine and almost no risk of EPS at this low-dose.  Hold alprazolam for sleep.  Prefer not to use chronic bz bc tolerance risks.  We discussed the short-term risks associated with benzodiazepines including sedation and increased fall risk among others.  Discussed long-term side effect risk including dependence, potential withdrawal symptoms, and the potential eventual dose-related risk of dementia.  She has FMLA for him intermittent leave due to panic attacks.  Start FT medical leave today until July 27 for panic disorder.  This appt was 30 mins.   Follow-up 3 mos  Meredith Staggersarey Cottle MD, DFAPA  Please see After Visit Summary for patient specific instructions.  Future Appointments  Date Time Provider Department Center  04/19/2019  4:00 PM Cottle, Steva Readyarey G Jr., MD [facility]  None    No orders of the defined types were placed in this encounter.     -------------------------------

## 2019-01-19 ENCOUNTER — Telehealth: Payer: Self-pay | Admitting: Psychiatry

## 2019-01-19 NOTE — Telephone Encounter (Signed)
Patient indicated in her visit yesterday that she was not sleeping through the night with Xanax.  She is already at 1 mg at night and I do not want to increase that dosage further.  We discussed the possibility of switching from mirtazapine to a longer acting medicine to try to keep her asleep such as quetiapine.  There is certainly a risk quetiapine can give her a hangover which I described discussed with her yesterday.  If she has changed her mind and does not want to take the medicine for that reason then I respect her decision.    If she is worried about weight gain that risk is very low with such a tiny dose of quetiapine.  There is also virtually no risk of other dopamine antagonistic side effects at such a low dose either.  We will keep this option for the future in case she decides as needed.

## 2019-01-19 NOTE — Telephone Encounter (Signed)
Left detailed message with information and to call back with any questions or concerns. 

## 2019-01-19 NOTE — Telephone Encounter (Signed)
Pt called stating she doesn't want to start Seroquel. Would like to continue Xanax. Have concerns about job and taking Seroquel.

## 2019-01-19 NOTE — Telephone Encounter (Signed)
Pt stated she would like to continue on her Seroquel. She is concerned the new med may cause weight gain. Please call to confirm this would be acceptable.

## 2019-01-24 ENCOUNTER — Other Ambulatory Visit: Payer: Self-pay

## 2019-01-24 ENCOUNTER — Telehealth: Payer: Self-pay | Admitting: Psychiatry

## 2019-01-24 MED ORDER — ALPRAZOLAM 0.5 MG PO TABS: ORAL_TABLET | ORAL | 1 refills | Status: DC

## 2019-01-24 NOTE — Telephone Encounter (Signed)
Pt left message. Need refill on Xanax. Did not pick up Seroquel. Did not want to take Seroquel.  Nurse please return call @ [phone]

## 2019-03-19 ENCOUNTER — Ambulatory Visit: Payer: 59 | Admitting: Cardiology

## 2019-03-21 NOTE — Progress Notes (Signed)
HPI The patient presents for followup of a history of coronary artery disease status post CABG x5 in 2006 followup cath in 2007 showed patent grafts and improved LV function EF 65%. She had a normal stress test in May 2012 and again in July 2016 and again in March of 2018.   She had a low risk perfusion study in Feb 2019.   Since I last saw her.  She has done relatively well.  She still has stress and anxiety but she is now working with a psychiatrist and psychologist and has improvement around this.  She gets some chest discomfort when she is stressed but this is been unchanged from when she had her stress test last year that was negative.  She does not have palpitations, presyncope or syncope.  She has no shortness of breath, PND or orthopnea.    No Known Allergies  Current Outpatient Medications  Medication Sig Dispense Refill  . ALPRAZolam (XANAX) 0.5 MG tablet 2 tabs for anxiety bid 120 tablet 1  . aspirin 81 MG chewable tablet Chew 81 mg by mouth daily.    . busPIRone (BUSPAR) 30 MG tablet TAKE 1 TABLET BY MOUTH TWICE A DAY 180 tablet 0  . divalproex (DEPAKOTE ER) 500 MG 24 hr tablet Take 3 tablets (1,500 mg total) by mouth at bedtime. 270 tablet 0  . glucose blood (ACCU-CHEK GUIDE) test strip Use to check blood sugar once a day.  DX  E11.65 100 each 1  . metoprolol succinate (TOPROL-XL) 25 MG 24 hr tablet TAKE 1 TABLET BY MOUTH EVERY DAY 90 tablet 1  . PARoxetine (PAXIL) 20 MG tablet Take 1 tablet (20 mg total) by mouth daily. 90 tablet 0  . QUEtiapine (SEROQUEL) 25 MG tablet Take 1-2 tablets (25-50 mg total) by mouth at bedtime. 180 tablet 0  . simvastatin (ZOCOR) 80 MG tablet TAKE 1 TABLET BY MOUTH AT BEDTIME 90 tablet 3   No current facility-administered medications for this visit.     Past Medical History:  Diagnosis Date  . Anxiety   . CAD (coronary artery disease) 2006   CABG w/ LIMA-LAD, RIMA-Diag, SVG-OM1-OM2, R radial-PDA  . 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   Qualifier:  Diagnosis of  By: Cheri Guppy    . Diabetes mellitus type 2 in nonobese (HCC) 07/2016  . Dyslipidemia   . Headache(784.0)   . HTN (hypertension)   . Hyperlipidemia   . Migraine     Past Surgical History:  Procedure Laterality Date  . BUNIONECTOMY  08/2011   right foot  . CARDIAC CATHETERIZATION  2007   severe native 3 v dz, all grafts patent (LIMA-LAD, RIMA-Diag, SVG-OM1-OM2, R radial-PDA)  . CESAREAN SECTION  1984  . CORONARY ARTERY BYPASS GRAFT  2006    Coronary artery bypass grafting x5 with a left  internal  mammary to the left anterior descending coronary artery.  Free right  internal mammary to the diagonal coronary artery, sequential reverse  saphenous vein graft to the first and second obtuse marginal, right  artery bypass to the posterior descending coronary artery with endo-vein harvesting.    ROS:   As stated in the HPI and negative for all other systems.   PHYSICAL EXAM BP 118/75   Pulse 64   Temp 97.9 F (36.6 C)   Ht 5\' 5"  (1.651 m)   Wt 158 lb 3.2 oz (71.8 kg)   LMP 11/28/2009   SpO2 97%   BMI 26.33 kg/m   GENERAL:  Well appearing NECK:  No jugular venous distention, waveform within normal limits, carotid upstroke brisk and symmetric, no bruits, no thyromegaly LUNGS:  Clear to auscultation bilaterally CHEST: Well healed sternotomy scar. HEART:  PMI not displaced or sustained,S1 and S2 within normal limits, no S3, no S4, no clicks, no rubs, no murmurs ABD:  Flat, positive bowel sounds normal in frequency in pitch, no bruits, no rebound, no guarding, no midline pulsatile mass, no hepatomegaly, no splenomegaly EXT:  2 plus pulses throughout, no edema, no cyanosis no clubbing   EKG: Sinus rhythm, rate 64, axis within normal limits, intervals within normal limits, no acute ST-T wave changes.   ASSESSMENT AND PLAN   CAD:   The patient has no new sypmtoms.  No further cardiovascular testing is indicated.  We will continue with aggressive risk reduction and  meds as listed.  DYSLIPIDEMIA:   We will have her come back for fasting lipid profile.  She is tolerating this high-dose Zocor for years I will make any changes.  HTN:    The blood pressure is at target.  No change in therapy.TLC).

## 2019-03-22 ENCOUNTER — Encounter: Payer: Self-pay | Admitting: Cardiology

## 2019-03-22 ENCOUNTER — Other Ambulatory Visit: Payer: Self-pay

## 2019-03-22 ENCOUNTER — Ambulatory Visit: Payer: 59 | Admitting: Cardiology

## 2019-03-22 VITALS — BP 118/75 | HR 64 | Temp 97.9°F | Ht 65.0 in | Wt 158.2 lb

## 2019-03-22 DIAGNOSIS — Z23 Encounter for immunization: Secondary | ICD-10-CM | POA: Diagnosis not present

## 2019-03-22 DIAGNOSIS — E785 Hyperlipidemia, unspecified: Secondary | ICD-10-CM | POA: Diagnosis not present

## 2019-03-22 DIAGNOSIS — I2510 Atherosclerotic heart disease of native coronary artery without angina pectoris: Secondary | ICD-10-CM | POA: Diagnosis not present

## 2019-03-22 DIAGNOSIS — I10 Essential (primary) hypertension: Secondary | ICD-10-CM

## 2019-03-22 NOTE — Patient Instructions (Signed)
Medication Instructions:  Your physician recommends that you continue on your current medications as directed. Please refer to the Current Medication list given to you today.  If you need a refill on your cardiac medications before your next appointment, please call your pharmacy.   Lab work: Cholesterol, Lipid, Hepatic Function If you have labs (blood work) drawn today and your tests are completely normal, you will receive your results only by: MyChart Message (if you have MyChart) OR A paper copy in the mail If you have any lab test that is abnormal or we need to change your treatment, we will call you to review the results.  Testing/Procedures: NONE  Follow-Up: At [facility], you and your health needs are our priority.  As part of our continuing mission to provide you with exceptional heart care, we have created designated Provider Care Teams.  These Care Teams include your primary Cardiologist (physician) and Advanced Practice Providers (APPs -  Physician Assistants and Nurse Practitioners) who all work together to provide you with the care you need, when you need it. You may see Rollene Rotunda, MD or one of the following Advanced Practice Providers on your designated Care Team:    Theodore Demark, PA-C  Joni Reining, DNP, ANP  Cadence Fransico Michael, NP  Your physician wants you to follow-up in: 1 year. You will receive a reminder letter in the mail two months in advance. If you don't receive a letter, please call our office to schedule the follow-up appointment.     Influenza, Adult Influenza, more commonly known as "the flu," is a viral infection that mainly affects the respiratory tract. The respiratory tract includes organs that help you breathe, such as the lungs, nose, and throat. The flu causes many symptoms similar to the common cold along with high fever and body aches. The flu spreads easily from person to person (is contagious). Getting a flu shot (influenza vaccination)  every year is the best way to prevent the flu. What are the causes? This condition is caused by the influenza virus. You can get the virus by:  Breathing in droplets that are in the air from an infected person's cough or sneeze.  Touching something that has been exposed to the virus (has been contaminated) and then touching your mouth, nose, or eyes. What increases the risk? The following factors may make you more likely to get the flu:  Not washing or sanitizing your hands often.  Having close contact with many people during cold and flu season.  Touching your mouth, eyes, or nose without first washing or sanitizing your hands.  Not getting a yearly (annual) flu shot. You may have a higher risk for the flu, including serious problems such as a lung infection (pneumonia), if you:  Are older than 65.  Are pregnant.  Have a weakened disease-fighting system (immune system). You may have a weakened immune system if you: ? Have HIV or AIDS. ? Are undergoing chemotherapy. ? Are taking medicines that reduce (suppress) the activity of your immune system.  Have a long-term (chronic) illness, such as heart disease, kidney disease, diabetes, or lung disease.  Have a liver disorder.  Are severely overweight (morbidly obese).  Have anemia. This is a condition that affects your red blood cells.  Have asthma. What are the signs or symptoms? Symptoms of this condition usually begin suddenly and last 4-14 days. They may include:  Fever and chills.  Headaches, body aches, or muscle aches.  Sore throat.  Cough.  Runny  or stuffy (congested) nose.  Chest discomfort.  Poor appetite.  Weakness or fatigue.  Dizziness.  Nausea or vomiting. How is this diagnosed? This condition may be diagnosed based on:  Your symptoms and medical history.  A physical exam.  Swabbing your nose or throat and testing the fluid for the influenza virus. How is this treated? If the flu is  diagnosed early, you can be treated with medicine that can help reduce how severe the illness is and how long it lasts (antiviral medicine). This may be given by mouth (orally) or through an IV. Taking care of yourself at home can help relieve symptoms. Your health care provider may recommend:  Taking over-the-counter medicines.  Drinking plenty of fluids. In many cases, the flu goes away on its own. If you have severe symptoms or complications, you may be treated in a hospital. Follow these instructions at home: Activity  Rest as needed and get plenty of sleep.  Stay home from work or school as told by your health care provider. Unless you are visiting your health care provider, avoid leaving home until your fever has been gone for 24 hours without taking medicine. Eating and drinking  Take an oral rehydration solution (ORS). This is a drink that is sold at pharmacies and retail stores.  Drink enough fluid to keep your urine pale yellow.  Drink clear fluids in small amounts as you are able. Clear fluids include water, ice chips, diluted fruit juice, and low-calorie sports drinks.  Eat bland, easy-to-digest foods in small amounts as you are able. These foods include bananas, applesauce, rice, lean meats, toast, and crackers.  Avoid drinking fluids that contain a lot of sugar or caffeine, such as energy drinks, regular sports drinks, and soda.  Avoid alcohol.  Avoid spicy or fatty foods. General instructions      Take over-the-counter and prescription medicines only as told by your health care provider.  Use a cool mist humidifier to add humidity to the air in your home. This can make it easier to breathe.  Cover your mouth and nose when you cough or sneeze.  Wash your hands with soap and water often, especially after you cough or sneeze. If soap and water are not available, use alcohol-based hand sanitizer.  Keep all follow-up visits as told by your health care provider. This  is important. How is this prevented?   Get an annual flu shot. You may get the flu shot in late summer, fall, or winter. Ask your health care provider when you should get your flu shot.  Avoid contact with people who are sick during cold and flu season. This is generally fall and winter. Contact a health care provider if:  You develop new symptoms.  You have: ? Chest pain. ? Diarrhea. ? A fever.  Your cough gets worse.  You produce more mucus.  You feel nauseous or you vomit. Get help right away if:  You develop shortness of breath or difficulty breathing.  Your skin or nails turn a bluish color.  You have severe pain or stiffness in your neck.  You develop a sudden headache or sudden pain in your face or ear.  You cannot eat or drink without vomiting. Summary  Influenza, more commonly known as "the flu," is a viral infection that primarily affects your respiratory tract.  Symptoms of the flu usually begin suddenly and last 4-14 days.  Getting an annual flu shot is the best way to prevent getting the flu.  Stay  home from work or school as told by your health care provider. Unless you are visiting your health care provider, avoid leaving home until your fever has been gone for 24 hours without taking medicine.  Keep all follow-up visits as told by your health care provider. This is important. This information is not intended to replace advice given to you by your health care provider. Make sure you discuss any questions you have with your health care provider. Document Released: 05/14/2000 Document Revised: 08/17/2018 Document Reviewed: 11/02/2017 Elsevier Patient Education  2020 [employer].

## 2019-04-13 ENCOUNTER — Other Ambulatory Visit: Payer: Self-pay | Admitting: Psychiatry

## 2019-04-19 ENCOUNTER — Encounter: Payer: Self-pay | Admitting: Psychiatry

## 2019-04-19 ENCOUNTER — Ambulatory Visit (INDEPENDENT_AMBULATORY_CARE_PROVIDER_SITE_OTHER): Payer: 59 | Admitting: Psychiatry

## 2019-04-19 ENCOUNTER — Other Ambulatory Visit: Payer: Self-pay

## 2019-04-19 DIAGNOSIS — F4001 Agoraphobia with panic disorder: Secondary | ICD-10-CM | POA: Diagnosis not present

## 2019-04-19 DIAGNOSIS — F3162 Bipolar disorder, current episode mixed, moderate: Secondary | ICD-10-CM

## 2019-04-19 DIAGNOSIS — F411 Generalized anxiety disorder: Secondary | ICD-10-CM | POA: Diagnosis not present

## 2019-04-19 DIAGNOSIS — F5105 Insomnia due to other mental disorder: Secondary | ICD-10-CM | POA: Diagnosis not present

## 2019-04-19 MED ORDER — DIVALPROEX SODIUM ER 500 MG PO TB24: 1500.0000 mg | ORAL_TABLET | Freq: Every day | ORAL | 0 refills | Status: DC

## 2019-04-19 MED ORDER — ALPRAZOLAM 0.5 MG PO TABS: ORAL_TABLET | ORAL | 3 refills | Status: DC

## 2019-04-19 MED ORDER — QUETIAPINE FUMARATE 25 MG PO TABS: 25.0000 mg | ORAL_TABLET | Freq: Every day | ORAL | 0 refills | Status: DC

## 2019-04-19 MED ORDER — PAROXETINE HCL 20 MG PO TABS: 20.0000 mg | ORAL_TABLET | Freq: Every day | ORAL | 0 refills | Status: DC

## 2019-04-19 MED ORDER — BUSPIRONE HCL 30 MG PO TABS: ORAL_TABLET | ORAL | 1 refills | Status: DC

## 2019-04-19 NOTE — Progress Notes (Signed)
Bailey Greene [id number] [birth year] 58 y.o.   Subjective:   Patient ID:  Bailey Greene is a 58 y.o. (DOB 1961-05-09) female.  Chief Complaint:  Chief Complaint  Patient presents with  . Follow-up    Medication Management  . Other    Moderate mixed bipolar I disorder     Anxiety Patient reports no decreased concentration, dizziness, nausea or nervous/anxious behavior.     [name] presents for follow-up of bipolar disorder and panic attacks and general anxiety.  Recent visit 5/22,2020.  Increased Depakote then to 1500 mg daily.  Boss rec LOA for 4 weeks.  Going through separation is really tough.  Panic attacks at work interfering.  Had this job 10 years.  Likes the job but hard to concentrate and stay focused.  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  Panic incr pulse and SOB, fear, sweating and shakey.  Panic lasts 20 mins and increased frequency.  Several in a day.  Going on for 2 mos but getting worse.  Boss can tell from listening to her calls and drop in production.  At follow up visit November 22, 2018.  Mood swings are better.  Trouble staying asleep.  Caffeine 1 coffee and 1 soda daily.She was still having severe anxiety plus panic.  We discussed the risk of SSRIs trickling triggering mood swings but the severity of the anxiety was such that we decided to initiate fluoxetine 10 mg daily to increase to 20 mg daily.  We also were using low-dose mirtazapine to help with sleep.  Last seen January 18, 2019.  She was granted medical leave for panic symptoms.  She was switched to paroxetine for panic from fluoxetine.  Since she was switched from mirtazapine to quetiapine 25 to 50 mg nightly for insomnia.   It is helping some with anxiety but a lot of stress.  No unusual mood swings without a change.   She's satisfied with the 20 mg paroxetine.  Sleep is much better with quetiapine and xanax at night.  5 hour and sometimes better depending on work schedule.    Seen  improvement.  Initially dizzy and nausea.  Some panic but not like it was.  GI SE resolved.  Trying to push through avoidance.  Able to exercise and walk better and big improvement in motivation.  Don't sleep as much.  To bet 1030 and awaken 330 and can't go back to sleep.  Some drowsiness.  Seldom nap.  Not hyper sleep quality has improved from 3 to 4 hours to 5 hours or more mainly affected by work schedule now.  No NM usually.  Not anxious upon awakening.    PDMP only shows Xanax.  She denies abusing substances  Past Psychiatric Medication Trials: Hydroxyzine, mirtazapine poor response for sleep, Depakote, Xanax, buspirone, duloxetine, citalopram, Wellbutrin, fluoxetine, paroxetine  quetiapine low-dose for sleep  Notes and chart were reviewed with the patient regarding prior history and symptoms.  Review of Systems:  Review of Systems  Gastrointestinal: Negative for diarrhea and nausea.  Neurological: Negative for dizziness, tremors and weakness.  Psychiatric/Behavioral: Positive for sleep disturbance. Negative for decreased concentration and dysphoric mood. The patient is not nervous/anxious.     Medications: I have reviewed the patient's current medications.  Current Outpatient Medications  Medication Sig Dispense Refill  . ALPRAZolam (XANAX) 0.5 MG tablet 2 tabs for anxiety bid 120 tablet 3  . aspirin 81 MG chewable tablet Chew 81 mg by mouth daily.    . busPIRone (BUSPAR) 30  MG tablet TAKE 1 TABLET BY MOUTH TWICE A DAY 180 tablet 1  . divalproex (DEPAKOTE ER) 500 MG 24 hr tablet Take 3 tablets (1,500 mg total) by mouth at bedtime. 270 tablet 0  . glucose blood (ACCU-CHEK GUIDE) test strip Use to check blood sugar once a day.  DX  E11.65 100 each 1  . metoprolol succinate (TOPROL-XL) 25 MG 24 hr tablet TAKE 1 TABLET BY MOUTH EVERY DAY 90 tablet 1  . PARoxetine (PAXIL) 20 MG tablet Take 1 tablet (20 mg total) by mouth daily. 90 tablet 0  . QUEtiapine (SEROQUEL) 25 MG tablet Take  1-2 tablets (25-50 mg total) by mouth at bedtime. 180 tablet 0  . simvastatin (ZOCOR) 80 MG tablet TAKE 1 TABLET BY MOUTH AT BEDTIME 90 tablet 3   No current facility-administered medications for this visit.     Medication Side Effects: ? EMA with Paxil not manic  Allergies: No Known Allergies  Past Medical History:  Diagnosis Date  . Anxiety   . CAD (coronary artery disease) 2006   CABG w/ LIMA-LAD, RIMA-Diag, SVG-OM1-OM2, R radial-PDA  . 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   Qualifier: Diagnosis of  By: Cheri Guppy    . Diabetes mellitus type 2 in nonobese (HCC) 07/2016  . Dyslipidemia   . Headache(784.0)   . HTN (hypertension)   . Hyperlipidemia   . Migraine     Family History  Problem Relation Age of Onset  . Lupus Mother   . Heart attack Father   . Diabetes Father   . Hypertension Father   . Diabetes Paternal Grandmother   . Hypertension Paternal Grandmother   . Stroke Neg Hx   . Kidney disease Neg Hx   . Hyperlipidemia Neg Hx   . Sudden death Neg Hx     Social History   Socioeconomic History  . Marital status: Legally Separated    Spouse name: Not on file  . Number of children: Not on file  . Years of education: Not on file  . Highest education level: Not on file  Occupational History  . Not on file  Social Needs  . Financial resource strain: Not on file  . Food insecurity    Worry: Not on file    Inability: Not on file  . Transportation needs    Medical: Not on file    Non-medical: Not on file  Tobacco Use  . Smoking status: Current Some Day Smoker    Packs/day: 0.50    Years: 8.00    Pack years: 4.00    Types: Cigarettes    Last attempt to quit: 12/25/2012    Years since quitting: 6.3  . Smokeless tobacco: Never Used  Substance and Sexual Activity  . Alcohol use: No    Alcohol/week: 0.0 standard drinks  . Drug use: Never  . Sexual activity: Yes    Partners: Male    Comment: married  Lifestyle  . Physical activity    Days per week: Not on  file    Minutes per session: Not on file  . Stress: Not on file  Relationships  . Social Musician on phone: Not on file    Gets together: Not on file    Attends religious service: Not on file    Active member of club or organization: Not on file    Attends meetings of clubs or organizations: Not on file    Relationship status: Not on file  . Intimate partner violence  Fear of current or ex partner: Not on file    Emotionally abused: Not on file    Physically abused: Not on file    Forced sexual activity: Not on file  Other Topics Concern  . Not on file  Social History Narrative   Regular exercise:  3 days weekly   Caffeine Use:  1 soda daily   One child biological daughter born in [id number] and an adopted niece.   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           Past Medical History, Surgical history, Social history, and Family history were reviewed and updated as appropriate.   Please see review of systems for further details on the patient's review from today.   Objective:   Physical Exam:  LMP 11/28/2009   Physical Exam Constitutional:      General: She is not in acute distress.    Appearance: She is well-developed.  Musculoskeletal:        General: No deformity.  Neurological:     Mental Status: She is alert and oriented to person, place, and time.     Coordination: Coordination normal.  Psychiatric:        Attention and Perception: She is attentive. She does not perceive auditory hallucinations.        Mood and Affect: Mood is anxious. Mood is not depressed. Affect is not labile, blunt, angry or inappropriate.        Speech: Speech normal.        Behavior: Behavior normal.        Thought Content: Thought content normal. Thought content does not include homicidal or suicidal ideation. Thought content does not include homicidal or suicidal plan.        Cognition and Memory: Cognition normal.        Judgment: Judgment normal.      Comments: Insight intact. No auditory or visual hallucinations. No delusions.  Residual anxiety and depression are adequately controlled.     Lab Review:     Component Value Date/Time   NA 143 07/19/2017 0331   NA 141 08/13/2016 1047   K 3.6 07/19/2017 0331   CL 108 07/19/2017 0331   CO2 24 07/19/2017 0331   GLUCOSE 122 (H) 07/19/2017 0331   BUN 8 07/19/2017 0331   BUN 11 08/13/2016 1047   CREATININE 0.72 07/19/2017 0331   CREATININE 0.72 06/22/2012 0840   CALCIUM 9.6 07/19/2017 0331   PROT 6.0 (L) 07/19/2017 0331   PROT 6.8 08/13/2016 1047   ALBUMIN 3.7 07/19/2017 0331   ALBUMIN 4.6 08/13/2016 1047   AST 18 07/19/2017 0331   ALT 15 07/19/2017 0331   ALKPHOS 98 07/19/2017 0331   BILITOT 1.3 (H) 07/19/2017 0331   BILITOT 0.3 08/13/2016 1047   GFRNONAA >60 07/19/2017 0331   GFRNONAA >89 06/22/2012 0840   GFRAA >60 07/19/2017 0331   GFRAA >89 06/22/2012 0840       Component Value Date/Time   WBC 7.7 07/19/2017 0331   RBC 4.44 07/19/2017 0331   HGB 13.4 07/19/2017 0331   HCT 39.3 07/19/2017 0331   PLT 176 07/19/2017 0331   MCV 88.5 07/19/2017 0331   MCH 30.2 07/19/2017 0331   MCHC 34.1 07/19/2017 0331   RDW 13.9 07/19/2017 0331   LYMPHSABS 2.7 06/22/2012 0840   MONOABS 0.5 06/22/2012 0840   EOSABS 0.1 06/22/2012 0840   BASOSABS 0.0 06/22/2012 0840    No results found for: POCLITH, LITHIUM  No results found for: PHENYTOIN, PHENOBARB, VALPROATE, CBMZ   .res Assessment: Plan:    Pricila was seen today for follow-up and other.  Diagnoses and all orders for this visit:  Moderate mixed bipolar I disorder (HCC) -     divalproex (DEPAKOTE ER) 500 MG 24 hr tablet; Take 3 tablets (1,500 mg total) by mouth at bedtime.  Panic disorder with agoraphobia -     ALPRAZolam (XANAX) 0.5 MG tablet; 2 tabs for anxiety bid -     PARoxetine (PAXIL) 20 MG tablet; Take 1 tablet (20 mg total) by mouth daily.  Generalized anxiety disorder -     busPIRone (BUSPAR) 30  MG tablet; TAKE 1 TABLET BY MOUTH TWICE A DAY -     PARoxetine (PAXIL) 20 MG tablet; Take 1 tablet (20 mg total) by mouth daily.  Insomnia due to mental condition -     QUEtiapine (SEROQUEL) 25 MG tablet; Take 1-2 tablets (25-50 mg total) by mouth at bedtime.   Greater than 50% of face to face time with patient was spent on counseling and coordination of care. We discussed the following: Ms. Zahniser has a little less uncontrolled mood swings throughout the week with rapid very rapid ultra-rapid cycling since the increase in Depakote to 1500 mg.  She also describes ongoing panic attacks some of which are precipitated by chronic ongoing stress and some occur out of the blue.  Her symptoms affect her both at home and at work.  Her symptoms have improved enough that they are not adversely affecting work function at this time.  She had had a brief leave of absence from work at the last visit but she is working now.  It was explained to her that in the context of rapid cycling we typically have to control the mood cycling before were going to be able to manage the anxiety.  She continued to use the Xanax as needed for panic attacks.   Alternatives for ins anxiety and panic could include switching to carbamazepine, clonidine, propranolol among others  Because of the severity of the panic disorder we  continue paroxetine 20 mg at night.  It was explained to her this could worsen her mood cycling and to let us know if that occurs.  Fortunately because no mood swings and her panic and anxiety symptoms are markedly improved.  Good response.  She still has residual anxiety but believes that stress related.  No indication to change Depakote nor paroxetine.  Quetiapine 25 mg 1-2 mg HS.  Disc SE in detail.   Discussed potential metabolic side effects associated with atypical antipsychotics, as well as potential risk for movement side effects. Advised pt to contact office if movement side effects occur.  Metabolic  side effects are unlikely at this low dose of quetiapine and almost no risk of EPS at this low-dose.    We discussed the short-term risks associated with benzodiazepines including sedation and increased fall risk among others.  Discussed long-term side effect risk including dependence, potential withdrawal symptoms, and the potential eventual dose-related risk of dementia.  This appt was 30 mins.   Follow-up 4 mos  Lynder Parents MD, DFAPA  Please see After Visit Summary for patient specific instructions.  No future appointments.  No orders of the defined types were placed in this encounter.     -------------------------------

## 2019-06-30 ENCOUNTER — Other Ambulatory Visit: Payer: Self-pay | Admitting: Cardiology

## 2019-07-13 ENCOUNTER — Other Ambulatory Visit: Payer: Self-pay | Admitting: Psychiatry

## 2019-07-13 DIAGNOSIS — F5105 Insomnia due to other mental disorder: Secondary | ICD-10-CM

## 2019-07-28 ENCOUNTER — Other Ambulatory Visit: Payer: Self-pay | Admitting: Psychiatry

## 2019-07-28 DIAGNOSIS — F3162 Bipolar disorder, current episode mixed, moderate: Secondary | ICD-10-CM

## 2019-07-28 DIAGNOSIS — F411 Generalized anxiety disorder: Secondary | ICD-10-CM

## 2019-07-28 DIAGNOSIS — F4001 Agoraphobia with panic disorder: Secondary | ICD-10-CM

## 2019-07-28 DIAGNOSIS — F5105 Insomnia due to other mental disorder: Secondary | ICD-10-CM

## 2019-10-23 ENCOUNTER — Other Ambulatory Visit: Payer: Self-pay

## 2019-10-23 ENCOUNTER — Encounter: Payer: Self-pay | Admitting: Psychiatry

## 2019-10-23 ENCOUNTER — Ambulatory Visit (INDEPENDENT_AMBULATORY_CARE_PROVIDER_SITE_OTHER): Payer: 59 | Admitting: Psychiatry

## 2019-10-23 DIAGNOSIS — F411 Generalized anxiety disorder: Secondary | ICD-10-CM

## 2019-10-23 DIAGNOSIS — F5105 Insomnia due to other mental disorder: Secondary | ICD-10-CM

## 2019-10-23 DIAGNOSIS — F4001 Agoraphobia with panic disorder: Secondary | ICD-10-CM

## 2019-10-23 DIAGNOSIS — F3162 Bipolar disorder, current episode mixed, moderate: Secondary | ICD-10-CM

## 2019-10-23 MED ORDER — ALPRAZOLAM 0.5 MG PO TABS: ORAL_TABLET | ORAL | 3 refills | Status: DC

## 2019-10-23 NOTE — Patient Instructions (Signed)
Increase paroxetine to 1-1/2 of the 20 mg tablets daily For sleep increase quetiapine to 2 the 25 mg tablets nightly As soon as convenient, get the blood test in the morning.  No fasting is required.

## 2019-10-23 NOTE — Progress Notes (Signed)
Bailey Greene [id number] 01/30/1961 59 y.o.   Subjective:   Patient ID:  Bailey Greene is a 59 y.o. (DOB 1960/11/06) female.  Chief Complaint:  Chief Complaint  Patient presents with  . Follow-up    worse  . Stress  . Anxiety    Anxiety Symptoms include nausea. Patient reports no decreased concentration, dizziness or nervous/anxious behavior.     Bailey Greene presents for follow-up of bipolar disorder and panic attacks and general anxiety.  visit 5/22,2020.  Increased Depakote then to 1500 mg daily.  Boss rec LOA for 4 weeks.  Going through separation is really tough.  Panic attacks at work interfering.  Had this job 10 years.  Likes the job but hard to concentrate and stay focused.  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  Panic incr pulse and SOB, fear, sweating and shakey.  Panic lasts 20 mins and increased frequency.  Several in a day.  Going on for 2 mos but getting worse.  Boss can tell from listening to her calls and drop in production.  At follow up visit November 22, 2018.  Mood swings are better.  Trouble staying asleep.  Caffeine 1 coffee and 1 soda daily.She was still having severe anxiety plus panic.  We discussed the risk of SSRIs trickling triggering mood swings but the severity of the anxiety was such that we decided to initiate fluoxetine 10 mg daily to increase to 20 mg daily.  We also were using low-dose mirtazapine to help with sleep.  seen January 18, 2019.  She was granted medical leave for panic symptoms.  She was switched to paroxetine for panic from fluoxetine.  Since she was switched from mirtazapine to quetiapine 25 to 50 mg nightly for insomnia.   November 2020 visit with the following noted: It is helping some with anxiety but a lot of stress.  No unusual mood swings without a change.   She's satisfied with the 20 mg paroxetine. Sleep is much better with quetiapine and xanax at night.  5 hour and sometimes better depending on work schedule.    No meds were changed.    10/23/2019 visit with the following noted: Anxiety, dep, panic attacks all worse lately for 2 mos.  Anger also worse mostly just at home bc manages it at work.  Several triggers.  Sister passed March 26 after illness.  Work and daughter are stressful.  Overwhelming. Not as good staying asleep.  Random panic.  Feels SOB.  Wanting to isolate. Spontaneous crying spell.s Poor concentration affecting work.     PDMP only shows Xanax.  She denies abusing substances  Past Psychiatric Medication Trials: Hydroxyzine, mirtazapine poor response for sleep, Depakote, Xanax, buspirone, duloxetine, citalopram, Wellbutrin, fluoxetine, paroxetine  quetiapine low-dose for sleep  Notes and chart were reviewed with the patient regarding prior history and symptoms.  Review of Systems:  Review of Systems  Gastrointestinal: Positive for diarrhea and nausea.  Neurological: Negative for dizziness, tremors and weakness.  Psychiatric/Behavioral: Positive for sleep disturbance. Negative for decreased concentration and dysphoric mood. The patient is not nervous/anxious.     Medications: I have reviewed the patient's current medications.  Current Outpatient Medications  Medication Sig Dispense Refill  . ALPRAZolam (XANAX) 0.5 MG tablet 2 tabs for anxiety bid 120 tablet 3  . aspirin 81 MG chewable tablet Chew 81 mg by mouth daily.    . busPIRone (BUSPAR) 30 MG tablet TAKE 1 TABLET BY MOUTH TWICE A DAY 180 tablet 1  . divalproex (DEPAKOTE ER)  500 MG 24 hr tablet TAKE 3 TABLETS (1,500 MG TOTAL) BY MOUTH AT BEDTIME. 270 tablet 0  . glucose blood (ACCU-CHEK GUIDE) test strip Use to check blood sugar once a day.  DX  E11.65 100 each 1  . metoprolol succinate (TOPROL-XL) 25 MG 24 hr tablet TAKE 1 TABLET BY MOUTH EVERY DAY 90 tablet 2  . PARoxetine (PAXIL) 20 MG tablet TAKE 1 TABLET BY MOUTH EVERY DAY 90 tablet 0  . QUEtiapine (SEROQUEL) 25 MG tablet TAKE 1-2 TABLETS (25-50 MG TOTAL) BY MOUTH  AT BEDTIME. (Patient taking differently: Take 25 mg by mouth at bedtime. ) 180 tablet 0  . simvastatin (ZOCOR) 80 MG tablet TAKE 1 TABLET BY MOUTH AT BEDTIME 90 tablet 3   No current facility-administered medications for this visit.    Medication Side Effects: ? EMA with Paxil not manic  Allergies: No Known Allergies  Past Medical History:  Diagnosis Date  . Anxiety   . CAD (coronary artery disease) 2006   CABG w/ LIMA-LAD, RIMA-Diag, SVG-OM1-OM2, R radial-PDA  . 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   Qualifier: Diagnosis of  By: Cheri Guppy    . Diabetes mellitus type 2 in nonobese (HCC) 07/2016  . Dyslipidemia   . Headache(784.0)   . HTN (hypertension)   . Hyperlipidemia   . Migraine     Family History  Problem Relation Age of Onset  . Lupus Mother   . Heart attack Father   . Diabetes Father   . Hypertension Father   . Diabetes Paternal Grandmother   . Hypertension Paternal Grandmother   . Stroke Neg Hx   . Kidney disease Neg Hx   . Hyperlipidemia Neg Hx   . Sudden death Neg Hx     Social History   Socioeconomic History  . Marital status: Legally Separated    Spouse name: Not on file  . Number of children: Not on file  . Years of education: Not on file  . Highest education level: Not on file  Occupational History  . Not on file  Tobacco Use  . Smoking status: Current Some Day Smoker    Packs/day: 0.50    Years: 8.00    Pack years: 4.00    Types: Cigarettes    Last attempt to quit: 12/25/2012    Years since quitting: 6.8  . Smokeless tobacco: Never Used  Substance and Sexual Activity  . Alcohol use: No    Alcohol/week: 0.0 standard drinks  . Drug use: Never  . Sexual activity: Yes    Partners: Male    Comment: married  Other Topics Concern  . Not on file  Social History Narrative   Regular exercise:  3 days weekly   Caffeine Use:  1 soda daily   One child biological daughter born in 72 and an adopted niece.   [employer]- Engineer, technical sales    Married- may be divorcing.          Social Determinants of Health   Financial Resource Strain:   . Difficulty of Paying Living Expenses:   Food Insecurity:   . Worried About Programme researcher, broadcasting/film/video in the Last Year:   . Barista in the Last Year:   Transportation Needs:   . Freight forwarder (Medical):   Marland Kitchen Lack of Transportation (Non-Medical):   Physical Activity:   . Days of Exercise per Week:   . Minutes of Exercise per Session:   Stress:   . Feeling of Stress :  Social Connections:   . Frequency of Communication with Friends and Family:   . Frequency of Social Gatherings with Friends and Family:   . Attends Religious Services:   . Active Member of Clubs or Organizations:   . Attends Banker Meetings:   Marland Kitchen Marital Status:   Intimate Partner Violence:   . Fear of Current or Ex-Partner:   . Emotionally Abused:   Marland Kitchen Physically Abused:   . Sexually Abused:     Past Medical History, Surgical history, Social history, and Family history were reviewed and updated as appropriate.   Please see review of systems for further details on the patient's review from today.   Objective:   Physical Exam:  LMP 11/28/2009   Physical Exam Constitutional:      General: She is not in acute distress.    Appearance: She is well-developed.  Musculoskeletal:        General: No deformity.  Neurological:     Mental Status: She is alert and oriented to person, place, and time.     Coordination: Coordination normal.  Psychiatric:        Attention and Perception: She is attentive. She does not perceive auditory hallucinations.        Mood and Affect: Mood is anxious and depressed. Affect is not labile, blunt, angry or inappropriate.        Speech: Speech normal.        Behavior: Behavior normal.        Thought Content: Thought content normal. Thought content does not include homicidal or suicidal ideation. Thought content does not include homicidal or suicidal plan.         Cognition and Memory: Cognition normal.        Judgment: Judgment normal.     Comments: Insight intact. No auditory or visual hallucinations. No delusions.       Lab Review:     Component Value Date/Time   NA 143 07/19/2017 0331   NA 141 08/13/2016 1047   K 3.6 07/19/2017 0331   CL 108 07/19/2017 0331   CO2 24 07/19/2017 0331   GLUCOSE 122 (H) 07/19/2017 0331   BUN 8 07/19/2017 0331   BUN 11 08/13/2016 1047   CREATININE 0.72 07/19/2017 0331   CREATININE 0.72 06/22/2012 0840   CALCIUM 9.6 07/19/2017 0331   PROT 6.0 (L) 07/19/2017 0331   PROT 6.8 08/13/2016 1047   ALBUMIN 3.7 07/19/2017 0331   ALBUMIN 4.6 08/13/2016 1047   AST 18 07/19/2017 0331   ALT 15 07/19/2017 0331   ALKPHOS 98 07/19/2017 0331   BILITOT 1.3 (H) 07/19/2017 0331   BILITOT 0.3 08/13/2016 1047   GFRNONAA >60 07/19/2017 0331   GFRNONAA >89 06/22/2012 0840   GFRAA >60 07/19/2017 0331   GFRAA >89 06/22/2012 0840       Component Value Date/Time   WBC 7.7 07/19/2017 0331   RBC 4.44 07/19/2017 0331   HGB 13.4 07/19/2017 0331   HCT 39.3 07/19/2017 0331   PLT 176 07/19/2017 0331   MCV 88.5 07/19/2017 0331   MCH 30.2 07/19/2017 0331   MCHC 34.1 07/19/2017 0331   RDW 13.9 07/19/2017 0331   LYMPHSABS 2.7 06/22/2012 0840   MONOABS 0.5 06/22/2012 0840   EOSABS 0.1 06/22/2012 0840   BASOSABS 0.0 06/22/2012 0840    No results found for: POCLITH, LITHIUM   No results found for: PHENYTOIN, PHENOBARB, VALPROATE, CBMZ   .res Assessment: Plan:    Meighan was seen today for follow-up, stress  and anxiety.  Diagnoses and all orders for this visit:  Moderate mixed bipolar I disorder (HCC)  Panic disorder with agoraphobia  Generalized anxiety disorder  Insomnia due to mental condition   Greater than 50% of face to face time with patient was spent on counseling and coordination of care. We discussed the following: Ms. Longfield has a little less uncontrolled mood swings throughout the week with rapid  very rapid ultra-rapid cycling since the increase in Depakote to 1500 mg.  She also describes ongoing panic attacks some of which are precipitated by chronic ongoing stress and some occur out of the blue.  Her symptoms affect her both at home and at work.  Her symptoms have improved enough that they are not adversely affecting work function at this time.  She had had a brief leave of absence from work at the last visit but she is working now.  It was explained to her that in the context of rapid cycling we typically have to control the mood cycling before were going to be able to manage the anxiety.  She continued to use the Xanax as needed for panic attacks.   Alternatives for ins anxiety and panic could include switching to carbamazepine, clonidine, propranolol among others  Because of the severity of the panic disorder increase paroxetine to 30 mg at night.  It was explained to her this could worsen her mood cycling and to let us know if that occurs.  Fortunately because no mood swings and her panic and anxiety symptoms are markedly improved.  Good response.  She still has residual anxiety but believes that stress related.  Check VPA level.   Increase Quetiapine 25 mg 2 mg HS.  Disc SE in detail.   Discussed potential metabolic side effects associated with atypical antipsychotics, as well as potential risk for movement side effects. Advised pt to contact office if movement side effects occur.  Metabolic side effects are unlikely at this low dose of quetiapine and almost no risk of EPS at this low-dose.    We discussed the short-term risks associated with benzodiazepines including sedation and increased fall risk among others.  Discussed long-term side effect risk including dependence, potential withdrawal symptoms, and the potential eventual dose-related risk of dementia.  Asks About FMLA same as last year.  This appt was 30 mins.   Follow-up 6 weeks  Lynder Parents MD, DFAPA  Please see  After Visit Summary for patient specific instructions. Increase paroxetine to 1-1/2 of the 20 mg tablets daily For sleep increase quetiapine to 2 the 25 mg tablets nightly As soon as convenient, get the blood test in the morning.  No fasting is required.  No future appointments.  No orders of the defined types were placed in this encounter.     -------------------------------

## 2019-10-31 ENCOUNTER — Telehealth: Payer: Self-pay | Admitting: Psychiatry

## 2019-10-31 NOTE — Telephone Encounter (Signed)
Received forms from [facility] 10/31/2019. Given to Nurse.

## 2019-10-31 NOTE — Telephone Encounter (Signed)
Received. Thank you.

## 2019-11-08 DIAGNOSIS — Z0289 Encounter for other administrative examinations: Secondary | ICD-10-CM

## 2019-11-29 ENCOUNTER — Other Ambulatory Visit: Payer: Self-pay | Admitting: Psychiatry

## 2019-11-29 ENCOUNTER — Ambulatory Visit: Payer: Managed Care, Other (non HMO) | Admitting: Psychiatry

## 2019-11-29 DIAGNOSIS — F3162 Bipolar disorder, current episode mixed, moderate: Secondary | ICD-10-CM

## 2019-12-25 ENCOUNTER — Other Ambulatory Visit: Payer: Self-pay | Admitting: Psychiatry

## 2019-12-25 DIAGNOSIS — F411 Generalized anxiety disorder: Secondary | ICD-10-CM

## 2019-12-25 DIAGNOSIS — F4001 Agoraphobia with panic disorder: Secondary | ICD-10-CM

## 2020-01-04 ENCOUNTER — Other Ambulatory Visit: Payer: Self-pay | Admitting: Psychiatry

## 2020-01-04 DIAGNOSIS — F411 Generalized anxiety disorder: Secondary | ICD-10-CM

## 2020-01-18 ENCOUNTER — Telehealth: Payer: 59 | Admitting: Physician Assistant

## 2020-01-18 DIAGNOSIS — N760 Acute vaginitis: Secondary | ICD-10-CM

## 2020-01-18 DIAGNOSIS — N898 Other specified noninflammatory disorders of vagina: Secondary | ICD-10-CM | POA: Diagnosis not present

## 2020-01-18 MED ORDER — FLUCONAZOLE 150 MG PO TABS: 150.0000 mg | ORAL_TABLET | Freq: Once | ORAL | 0 refills | Status: AC

## 2020-01-18 NOTE — Progress Notes (Signed)
Hi Angeliki,  I am sorry you are not feeling well.  It is difficult to know what is causing your itching without a physical exam.  I will treat you today for a possible yeast infection, as yeast can cause itching.  If you fail to improve after 24-48 hours, please schedule an appointment with your PCP -- if you cannot get an appointment with your PCP, you can go to one of our Urgent Cares close to you.   Based on what you shared with me it looks like you: May have a yeast vaginosis  Vaginosis is an inflammation of the vagina that can result in discharge, itching and pain. The cause is usually a change in the normal balance of vaginal bacteria or an infection. Vaginosis can also result from reduced estrogen levels after menopause.  The most common causes of vaginosis are:   Bacterial vaginosis which results from an overgrowth of one on several organisms that are normally present in your vagina.   Yeast infections which are caused by a naturally occurring fungus called candida.   Vaginal atrophy (atrophic vaginosis) which results from the thinning of the vagina from reduced estrogen levels after menopause.   Trichomoniasis which is caused by a parasite and is commonly transmitted by sexual intercourse.  Factors that increase your risk of developing vaginosis include: Marland Kitchen Medications, such as antibiotics and steroids . Uncontrolled diabetes . Use of hygiene products such as bubble bath, vaginal spray or vaginal deodorant . Douching . Wearing damp or tight-fitting clothing . Using an intrauterine device (IUD) for birth control . Hormonal changes, such as those associated with pregnancy, birth control pills or menopause . Sexual activity . Having a sexually transmitted infection  Your treatment plan is Diflucan 150mg  one pill once.   Be sure to take all of the medication as directed. Stop taking any medication if you develop a rash, tongue swelling or shortness of breath. Mothers who are  breast feeding should consider pumping and discarding their breast milk while on these antibiotics. However, there is no consensus that infant exposure at these doses would be harmful.  Remember that medication creams can weaken latex condoms.   HOME CARE:  Good hygiene may prevent some types of vaginosis from recurring and may relieve some symptoms:  . Avoid baths, hot tubs and whirlpool spas. Rinse soap from your outer genital area after a shower, and dry the area well to prevent irritation. Don't use scented or harsh soaps, such as those with deodorant or antibacterial action. Marland Kitchen Avoid irritants. These include scented tampons and pads. . Wipe from front to back after using the toilet. Doing so avoids spreading fecal bacteria to your vagina.  Other things that may help prevent vaginosis include:  Marland Kitchen Don't douche. Your vagina doesn't require cleansing other than normal bathing. Repetitive douching disrupts the normal organisms that reside in the vagina and can actually increase your risk of vaginal infection. Douching won't clear up a vaginal infection. . Use a latex condom. Both female and female latex condoms may help you avoid infections spread by sexual contact. . Wear cotton underwear. Also wear pantyhose with a cotton crotch. If you feel comfortable without it, skip wearing underwear to bed. Yeast thrives in Marland Kitchen Your symptoms should improve in the next day or two.  GET HELP RIGHT AWAY IF:  . You have pain in your lower abdomen ( pelvic area or over your ovaries) . You develop nausea or vomiting . You develop a fever .  Your discharge changes or worsens . You have persistent pain with intercourse . You develop shortness of breath, a rapid pulse, or you faint.  These symptoms could be signs of problems or infections that need to be evaluated by a medical provider now.  MAKE SURE YOU    Understand these instructions.  Will watch your condition.  Will get help  right away if you are not doing well or get worse.  Your e-visit answers were reviewed by a board certified advanced clinical practitioner to complete your personal care plan. Depending upon the condition, your plan could have included both over the counter or prescription medications. Please review your pharmacy choice to make sure that you have choses a pharmacy that is open for you to pick up any needed prescription, Your safety is important to [place]. If you have drug allergies check your prescription carefully.   You can use MyChart to ask questions about today's visit, request a non-urgent call back, or ask for a work or school excuse for 24 hours related to this e-Visit. If it has been greater than 24 hours you will need to follow up with your provider, or enter a new e-Visit to address those concerns. You will get a MyChart message within the next two days asking about your experience. I hope that your e-visit has been valuable and will speed your recovery.  Greater than 5 minutes, yet less than 10 minutes of time have been spent researching, coordinating and implementing care for this patient today.

## 2020-01-30 ENCOUNTER — Telehealth: Payer: 59 | Admitting: Psychiatry

## 2020-02-22 ENCOUNTER — Other Ambulatory Visit: Payer: Self-pay | Admitting: Psychiatry

## 2020-02-22 DIAGNOSIS — F3162 Bipolar disorder, current episode mixed, moderate: Secondary | ICD-10-CM

## 2020-02-26 NOTE — Telephone Encounter (Signed)
review 

## 2020-03-05 ENCOUNTER — Encounter: Payer: Self-pay | Admitting: Psychiatry

## 2020-03-05 ENCOUNTER — Ambulatory Visit (INDEPENDENT_AMBULATORY_CARE_PROVIDER_SITE_OTHER): Payer: 59 | Admitting: Psychiatry

## 2020-03-05 ENCOUNTER — Other Ambulatory Visit: Payer: Self-pay

## 2020-03-05 DIAGNOSIS — F411 Generalized anxiety disorder: Secondary | ICD-10-CM

## 2020-03-05 DIAGNOSIS — F3162 Bipolar disorder, current episode mixed, moderate: Secondary | ICD-10-CM | POA: Diagnosis not present

## 2020-03-05 DIAGNOSIS — F5105 Insomnia due to other mental disorder: Secondary | ICD-10-CM | POA: Diagnosis not present

## 2020-03-05 DIAGNOSIS — F4001 Agoraphobia with panic disorder: Secondary | ICD-10-CM

## 2020-03-05 MED ORDER — QUETIAPINE FUMARATE 25 MG PO TABS: 25.0000 mg | ORAL_TABLET | Freq: Every day | ORAL | 0 refills | Status: DC

## 2020-03-05 MED ORDER — BUSPIRONE HCL 30 MG PO TABS: 30.0000 mg | ORAL_TABLET | Freq: Two times a day (BID) | ORAL | 0 refills | Status: DC

## 2020-03-05 MED ORDER — DIVALPROEX SODIUM ER 500 MG PO TB24: 1500.0000 mg | ORAL_TABLET | Freq: Every day | ORAL | 0 refills | Status: DC

## 2020-03-05 MED ORDER — PAROXETINE HCL 20 MG PO TABS: 40.0000 mg | ORAL_TABLET | Freq: Every day | ORAL | 0 refills | Status: DC

## 2020-03-05 MED ORDER — ALPRAZOLAM 0.5 MG PO TABS: ORAL_TABLET | ORAL | 3 refills | Status: DC

## 2020-03-05 NOTE — Progress Notes (Signed)
Bailey Greene [id number] [birth year]Gerhard Perches64 59 y.o.   Subjective:   Patient ID:  Bailey PerchesJacqueline Greene is a 59 y.o. (DOB 03/03/1961) female.  Chief Complaint:  Chief Complaint  Patient presents with  . Follow-up  . Anxiety  . Depression    Anxiety Symptoms include nervous/anxious behavior. Patient reports no decreased concentration, dizziness or nausea.     Bailey PerchesJacqueline Greene presents for follow-up of bipolar disorder and panic attacks and general anxiety.  visit 5/22,2020.  Increased Depakote then to 1500 mg daily.  Boss rec LOA for 4 weeks.  Going through separation is really tough.  Panic attacks at work interfering.  Had this job 10 years.  Likes the job but hard to concentrate and stay focused.  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  Panic incr pulse and SOB, fear, sweating and shakey.  Panic lasts 20 mins and increased frequency.  Several in a day.  Going on for 2 mos but getting worse.  Boss can tell from listening to her calls and drop in production.  At follow up visit November 22, 2018.  Mood swings are better.  Trouble staying asleep.  Caffeine 1 coffee and 1 soda daily.She was still having severe anxiety plus panic.  We discussed the risk of SSRIs trickling triggering mood swings but the severity of the anxiety was such that we decided to initiate fluoxetine 10 mg daily to increase to 20 mg daily.  We also were using low-dose mirtazapine to help with sleep.  seen January 18, 2019.  She was granted medical leave for panic symptoms.  She was switched to paroxetine for panic from fluoxetine.  Since she was switched from mirtazapine to quetiapine 25 to 50 mg nightly for insomnia.   November 2020 visit with the following noted: It is helping some with anxiety but a lot of stress.  No unusual mood swings without a change.   She's satisfied with the 20 mg paroxetine. Sleep is much better with quetiapine and xanax at night.  5 hour and sometimes better depending on work schedule.   No meds  were changed.    10/23/2019 visit with the following noted: Anxiety, dep, panic attacks all worse lately for 2 mos.  Anger also worse mostly just at home bc manages it at work.  Several triggers.  Sister passed March 26 after illness.  Work and daughter are stressful.  Overwhelming. Not as good staying asleep.  Random panic.  Feels SOB.  Wanting to isolate. Spontaneous crying spell.s Poor concentration affecting work.    Plan: Increase paroxetine to 1-1/2 of the 20 mg tablets daily For sleep increase quetiapine to 2 the 25 mg tablets nightly  03/05/20 appt with following noted: Less anxious and sleeping is better.  Panic can be triggered at work with SOB and heart racing.  Happens about 2 times weekly. Main stress is work is overwhelming.  Talk with customers all day.   No clear mania.   Still some depression too. No SE  PDMP only shows Xanax.  She denies abusing substances  Past Psychiatric Medication Trials: Hydroxyzine, mirtazapine poor response for sleep, Depakote, Xanax, buspirone, duloxetine, citalopram, Wellbutrin, fluoxetine, paroxetine  quetiapine low-dose for sleep  Notes and chart were reviewed with the patient regarding prior history and symptoms.  Review of Systems:  Review of Systems  Gastrointestinal: Negative for diarrhea and nausea.  Neurological: Negative for dizziness, tremors and weakness.  Psychiatric/Behavioral: Negative for decreased concentration, dysphoric mood and sleep disturbance. The patient is nervous/anxious.  Medications: I have reviewed the patient's current medications.  Current Outpatient Medications  Medication Sig Dispense Refill  . ALPRAZolam (XANAX) 0.5 MG tablet 2 tabs for anxiety bid 120 tablet 3  . aspirin 81 MG chewable tablet Chew 81 mg by mouth daily.    . busPIRone (BUSPAR) 30 MG tablet Take 1 tablet (30 mg total) by mouth 2 (two) times daily. 180 tablet 0  . divalproex (DEPAKOTE ER) 500 MG 24 hr tablet Take 3 tablets (1,500 mg  total) by mouth at bedtime. 270 tablet 0  . glucose blood (ACCU-CHEK GUIDE) test strip Use to check blood sugar once a day.  DX  E11.65 100 each 1  . metoprolol succinate (TOPROL-XL) 25 MG 24 hr tablet TAKE 1 TABLET BY MOUTH EVERY DAY 90 tablet 2  . PARoxetine (PAXIL) 20 MG tablet Take 2 tablets (40 mg total) by mouth daily. 180 tablet 0  . QUEtiapine (SEROQUEL) 25 MG tablet Take 1-2 tablets (25-50 mg total) by mouth at bedtime. 180 tablet 0  . simvastatin (ZOCOR) 80 MG tablet TAKE 1 TABLET BY MOUTH AT BEDTIME 90 tablet 3   No current facility-administered medications for this visit.    Medication Side Effects: ? EMA with Paxil not manic  Allergies: No Known Allergies  Past Medical History:  Diagnosis Date  . Anxiety   . CAD (coronary artery disease) 2006   CABG w/ LIMA-LAD, RIMA-Diag, SVG-OM1-OM2, R radial-PDA  . 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   Qualifier: Diagnosis of  By: Cheri Guppy    . Diabetes mellitus type 2 in nonobese (HCC) 07/2016  . Dyslipidemia   . Headache(784.0)   . HTN (hypertension)   . Hyperlipidemia   . Migraine     Family History  Problem Relation Age of Onset  . Lupus Mother   . Heart attack Father   . Diabetes Father   . Hypertension Father   . Diabetes Paternal Grandmother   . Hypertension Paternal Grandmother   . Stroke Neg Hx   . Kidney disease Neg Hx   . Hyperlipidemia Neg Hx   . Sudden death Neg Hx     Social History   Socioeconomic History  . Marital status: Legally Separated    Spouse name: Not on file  . Number of children: Not on file  . Years of education: Not on file  . Highest education level: Not on file  Occupational History  . Not on file  Tobacco Use  . Smoking status: Current Some Day Smoker    Packs/day: 0.50    Years: 8.00    Pack years: 4.00    Types: Cigarettes    Last attempt to quit: 12/25/2012    Years since quitting: 7.1  . Smokeless tobacco: Never Used  Substance and Sexual Activity  . Alcohol use: No     Alcohol/week: 0.0 standard drinks  . Drug use: Never  . Sexual activity: Yes    Partners: Male    Comment: married  Other Topics Concern  . Not on file  Social History Narrative   Regular exercise:  3 days weekly   Caffeine Use:  1 soda daily   One child biological daughter born in 68 and an adopted niece.   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          Social Determinants of Health   Financial Resource Strain:   . Difficulty of Paying Living Expenses: Not on file  Food Insecurity:   . Worried About  Running Out of Food in the Last Year: Not on file  . Ran Out of Food in the Last Year: Not on file  Transportation Needs:   . Lack of Transportation (Medical): Not on file  . Lack of Transportation (Non-Medical): Not on file  Physical Activity:   . Days of Exercise per Week: Not on file  . Minutes of Exercise per Session: Not on file  Stress:   . Feeling of Stress : Not on file  Social Connections:   . Frequency of Communication with Friends and Family: Not on file  . Frequency of Social Gatherings with Friends and Family: Not on file  . Attends Religious Services: Not on file  . Active Member of Clubs or Organizations: Not on file  . Attends Banker Meetings: Not on file  . Marital Status: Not on file  Intimate Partner Violence:   . Fear of Current or Ex-Partner: Not on file  . Emotionally Abused: Not on file  . Physically Abused: Not on file  . Sexually Abused: Not on file    Past Medical History, Surgical history, Social history, and Family history were reviewed and updated as appropriate.   Please see review of systems for further details on the patient's review from today.   Objective:   Physical Exam:  LMP 11/28/2009   Physical Exam Constitutional:      General: She is not in acute distress.    Appearance: She is well-developed.  Musculoskeletal:        General: No deformity.  Neurological:     Mental Status:  She is alert and oriented to person, place, and time.     Coordination: Coordination normal.  Psychiatric:        Attention and Perception: She is attentive. She does not perceive auditory hallucinations.        Mood and Affect: Mood is anxious and depressed. Affect is not labile, blunt, angry, tearful or inappropriate.        Speech: Speech normal.        Behavior: Behavior normal.        Thought Content: Thought content normal. Thought content does not include homicidal or suicidal ideation. Thought content does not include homicidal or suicidal plan.        Cognition and Memory: Cognition normal.        Judgment: Judgment normal.     Comments: Insight intact. No auditory or visual hallucinations. No delusions.       Lab Review:     Component Value Date/Time   NA 143 07/19/2017 0331   NA 141 08/13/2016 1047   K 3.6 07/19/2017 0331   CL 108 07/19/2017 0331   CO2 24 07/19/2017 0331   GLUCOSE 122 (H) 07/19/2017 0331   BUN 8 07/19/2017 0331   BUN 11 08/13/2016 1047   CREATININE 0.72 07/19/2017 0331   CREATININE 0.72 06/22/2012 0840   CALCIUM 9.6 07/19/2017 0331   PROT 6.0 (L) 07/19/2017 0331   PROT 6.8 08/13/2016 1047   ALBUMIN 3.7 07/19/2017 0331   ALBUMIN 4.6 08/13/2016 1047   AST 18 07/19/2017 0331   ALT 15 07/19/2017 0331   ALKPHOS 98 07/19/2017 0331   BILITOT 1.3 (H) 07/19/2017 0331   BILITOT 0.3 08/13/2016 1047   GFRNONAA >60 07/19/2017 0331   GFRNONAA >89 06/22/2012 0840   GFRAA >60 07/19/2017 0331   GFRAA >89 06/22/2012 0840       Component Value Date/Time   WBC 7.7 07/19/2017 0331  RBC 4.44 07/19/2017 0331   HGB 13.4 07/19/2017 0331   HCT 39.3 07/19/2017 0331   PLT 176 07/19/2017 0331   MCV 88.5 07/19/2017 0331   MCH 30.2 07/19/2017 0331   MCHC 34.1 07/19/2017 0331   RDW 13.9 07/19/2017 0331   LYMPHSABS 2.7 06/22/2012 0840   MONOABS 0.5 06/22/2012 0840   EOSABS 0.1 06/22/2012 0840   BASOSABS 0.0 06/22/2012 0840    No results found for: POCLITH,  LITHIUM   No results found for: PHENYTOIN, PHENOBARB, VALPROATE, CBMZ   .res Assessment: Plan:    Bailey Greene was seen today for follow-up, anxiety and depression.  Diagnoses and all orders for this visit:  Moderate mixed bipolar I disorder (HCC) -     divalproex (DEPAKOTE ER) 500 MG 24 hr tablet; Take 3 tablets (1,500 mg total) by mouth at bedtime.  Panic disorder with agoraphobia -     ALPRAZolam (XANAX) 0.5 MG tablet; 2 tabs for anxiety bid -     PARoxetine (PAXIL) 20 MG tablet; Take 2 tablets (40 mg total) by mouth daily.  Generalized anxiety disorder -     busPIRone (BUSPAR) 30 MG tablet; Take 1 tablet (30 mg total) by mouth 2 (two) times daily. -     PARoxetine (PAXIL) 20 MG tablet; Take 2 tablets (40 mg total) by mouth daily.  Insomnia due to mental condition -     QUEtiapine (SEROQUEL) 25 MG tablet; Take 1-2 tablets (25-50 mg total) by mouth at bedtime.   Greater than 50% of face to face time with patient was spent on counseling and coordination of care. We discussed the following: Bailey Greene has a little less uncontrolled mood swings throughout the week with rapid very rapid ultra-rapid cycling since the increase in Depakote to 1500 mg.  She also describes ongoing panic attacks some of which are precipitated by chronic ongoing stress and some occur out of the blue.  Her symptoms affect her both at home and at work.  Her symptoms have improved enough that they are not adversely affecting work function at this time.  She had had a brief leave of absence from work at the last visit but she is working now.  It was explained to her that in the context of rapid cycling we typically have to control the mood cycling before were going to be able to manage the anxiety.  She continued to use the Xanax as needed for panic attacks.   Alternatives for ins anxiety and panic could include switching to carbamazepine, clonidine, propranolol among others  Because of the severity of the panic  disorder increase paroxetine to 40 mg at night.  It was explained to her this could worsen her mood cycling and to let us know if that occurs.  Fortunately because no mood swings and her panic and anxiety symptoms are markedly improved.  Good response.  She still has residual anxiety but believes that stress related.  No VPA level received.   Increase Quetiapine 25 mg 2 mg HS.  Disc SE in detail.   Discussed potential metabolic side effects associated with atypical antipsychotics, as well as potential risk for movement side effects. Advised pt to contact office if movement side effects occur.  Metabolic side effects are unlikely at this low dose of quetiapine and almost no risk of EPS at this low-dose.   We discussed the short-term risks associated with benzodiazepines including sedation and increased fall risk among others.  Discussed long-term side effect risk including dependence, potential withdrawal  symptoms, and the potential eventual dose-related risk of dementia.  Asks About FMLA same as last year.  This appt was 30 mins.   Follow-up 8 weeks  Meredith Staggers MD, DFAPA  Please see After Visit Summary for patient specific instructions.  As soon as convenient, get the blood test in the morning.  No fasting is required.  No future appointments.  No orders of the defined types were placed in this encounter.     -------------------------------

## 2020-03-11 LAB — HM PAP SMEAR: HM Pap smear: NORMAL

## 2020-03-11 LAB — RESULTS CONSOLE HPV: CHL HPV: NEGATIVE

## 2020-03-11 LAB — HM MAMMOGRAPHY

## 2020-03-24 ENCOUNTER — Telehealth: Payer: Self-pay | Admitting: Psychiatry

## 2020-03-24 NOTE — Telephone Encounter (Signed)
Received Health Care Provider Statement from [facility] Claims.

## 2020-03-31 ENCOUNTER — Telehealth: Payer: Self-pay | Admitting: Psychiatry

## 2020-03-31 NOTE — Telephone Encounter (Signed)
Bailey Greene called again for an update on her disability form from [employer]. I don;t see it as completed unless I'm missing it. I told her I would call her back. Thanks

## 2020-04-01 NOTE — Telephone Encounter (Signed)
Pt needs appt. Sched 12/8 which is first available at this time.

## 2020-04-01 NOTE — Telephone Encounter (Signed)
Paperwork completed and given to Dr. Jennelle Human to review and sign this morning.

## 2020-04-01 NOTE — Telephone Encounter (Signed)
This was not her renewal to her FMLA. [place] with [employer] was requesting records and notes regarding a leave effective 03/18/2020. Contacted patient and she is currently out of work now. Effective 03/18/2020 through 04/18/2020. She reports having an increase in her anxiety and panic attacks, with shortness of breathe and elevated heart rate. Her depression has also worsened, she is not sure what the flare up may be triggered by but felt she needed a break from work due to stressful situations.They suggest she take a 4 week leave. Patient isn't asking to change or do anything different with her medication at this time. Advised her to contact us next time so we are aware of a leave. She will also contact us back with worsening symptoms.   Will complete forms with more accurate information, have Dr. Jennelle Human review and sign.

## 2020-04-03 DIAGNOSIS — Z0289 Encounter for other administrative examinations: Secondary | ICD-10-CM

## 2020-04-03 NOTE — Progress Notes (Signed)
Cardiology Office Note   Date:  04/04/2020   ID:  Bailey Greene, DOB Nov 09, 1960, MRN [id number]  PCP:  Sandford Craze, NP  Cardiologist:   Rollene Rotunda, MD   Chief Complaint  Patient presents with  . Coronary Artery Disease      History of Present Illness: Bailey Greene is a 59 y.o. female who presents for followup of a history of coronary artery disease status post CABG x5 in 2006 followup cath in 2007 showed patent grafts and improved LV function EF 65%. She had a normal stress test in May 2012 and again in July 2016 and again in March of 2018.   She had a low risk perfusion study in Feb 2019.   Since I last saw her she has done well from a cardiovascular standpoint.  She still has stress at home but she is able to go walking and relieve the stress.  She walks routinely for exercise. The patient denies any new symptoms such as chest discomfort, neck or arm discomfort. There has been no new shortness of breath, PND or orthopnea. There have been no reported palpitations, presyncope or syncope.     Past Medical History:  Diagnosis Date  . Anxiety   . CAD (coronary artery disease) 2006   CABG w/ LIMA-LAD, RIMA-Diag, SVG-OM1-OM2, R radial-PDA  . 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   Qualifier: Diagnosis of  By: Cheri Guppy    . Diabetes mellitus type 2 in nonobese (HCC) 07/2016  . Dyslipidemia   . Headache(784.0)   . HTN (hypertension)   . Hyperlipidemia   . Migraine     Past Surgical History:  Procedure Laterality Date  . BUNIONECTOMY  08/2011   right foot  . CARDIAC CATHETERIZATION  2007   severe native 3 v dz, all grafts patent (LIMA-LAD, RIMA-Diag, SVG-OM1-OM2, R radial-PDA)  . CESAREAN SECTION  1984  . CORONARY ARTERY BYPASS GRAFT  2006    Coronary artery bypass grafting x5 with a left  internal  mammary to the left anterior descending coronary artery.  Free right  internal mammary to the diagonal coronary artery, sequential reverse  saphenous vein graft to  the first and second obtuse marginal, right  artery bypass to the posterior descending coronary artery with endo-vein harvesting.     Current Outpatient Medications  Medication Sig Dispense Refill  . ALPRAZolam (XANAX) 0.5 MG tablet 2 tabs for anxiety bid 120 tablet 3  . aspirin 81 MG chewable tablet Chew 81 mg by mouth daily.    . busPIRone (BUSPAR) 30 MG tablet Take 1 tablet (30 mg total) by mouth 2 (two) times daily. 180 tablet 0  . divalproex (DEPAKOTE ER) 500 MG 24 hr tablet Take 3 tablets (1,500 mg total) by mouth at bedtime. 270 tablet 0  . glucose blood (ACCU-CHEK GUIDE) test strip Use to check blood sugar once a day.  DX  E11.65 100 each 1  . metoprolol succinate (TOPROL-XL) 25 MG 24 hr tablet TAKE 1 TABLET BY MOUTH EVERY DAY 90 tablet 2  . PARoxetine (PAXIL) 20 MG tablet Take 2 tablets (40 mg total) by mouth daily. 180 tablet 0  . QUEtiapine (SEROQUEL) 25 MG tablet Take 1-2 tablets (25-50 mg total) by mouth at bedtime. 180 tablet 0  . simvastatin (ZOCOR) 80 MG tablet TAKE 1 TABLET BY MOUTH AT BEDTIME 90 tablet 3   No current facility-administered medications for this visit.    Allergies:   Patient has no known allergies.    ROS:  Please  see the history of present illness.   Otherwise, review of systems are positive for none.   All other systems are reviewed and negative.    PHYSICAL EXAM: VS:  BP 102/67   Pulse 80   Ht 5\' 5"  (1.651 m)   Wt 150 lb 12.8 oz (68.4 kg)   LMP 11/28/2009   SpO2 98%   BMI 25.09 kg/m  , BMI Body mass index is 25.09 kg/m. GENERAL:  Well appearing NECK:  No jugular venous distention, waveform within normal limits, carotid upstroke brisk and symmetric, no bruits, no thyromegaly LUNGS:  Clear to auscultation bilaterally CHEST:  Well healed sternotomy scar. HEART:  PMI not displaced or sustained,S1 and S2 within normal limits, no S3, no S4, no clicks, no rubs, no murmurs ABD:  Flat, positive bowel sounds normal in frequency in pitch, no bruits, no  rebound, no guarding, no midline pulsatile mass, no hepatomegaly, no splenomegaly EXT:  2 plus pulses throughout, no edema, no cyanosis no clubbing   EKG:  EKG is ordered today. The ekg ordered today demonstrates sinus rhythm, rate 80, axis within normal limits, intervals within normal limits, no acute ST-T wave changes.   Recent Labs: No results found for requested labs within last 8760 hours.    Lipid Panel    Component Value Date/Time   CHOL 157 08/17/2016 1225   TRIG 114.0 08/17/2016 1225   HDL 60.60 08/17/2016 1225   CHOLHDL 3 08/17/2016 1225   VLDL 22.8 08/17/2016 1225   LDLCALC 74 08/17/2016 1225   LDLDIRECT 145.2 07/18/2008 0821      Wt Readings from Last 3 Encounters:  04/04/20 150 lb 12.8 oz (68.4 kg)  03/22/19 158 lb 3.2 oz (71.8 kg)  01/20/18 142 lb (64.4 kg)      Other studies Reviewed: Additional studies/ records that were reviewed today include: Labs. Review of the above records demonstrates:  Please see elsewhere in the note.     ASSESSMENT AND PLAN:  CAD:   The patient has no new sypmtoms.  No further cardiovascular testing is indicated.  We will continue with aggressive risk reduction and meds as listed.  DYSLIPIDEMIA:    I will check a lipid profile liver enzymes.  She has been on high-dose Zocor for years with no problem so there is no indication to change.    HTN:    For blood pressure is at target.  No change in therapy.  DM: I will check an A1c.  If she is not at target she might be a reasonable candidate for SGLT2 inhibitors or GLP-1 receptor antagonist.   Current medicines are reviewed at length with the patient today.  The patient does not have concerns regarding medicines.  The following changes have been made:  no change  Labs/ tests ordered today include:   Orders Placed This Encounter  Procedures  . Lipid panel  . Hepatic function panel  . HgB A1c     Disposition:   FU with me in one year.     Signed, 01/22/18, MD   04/04/2020 9:54 AM    [facility]

## 2020-04-04 ENCOUNTER — Ambulatory Visit: Payer: 59 | Admitting: Cardiology

## 2020-04-04 ENCOUNTER — Telehealth: Payer: Self-pay

## 2020-04-04 ENCOUNTER — Other Ambulatory Visit: Payer: Self-pay

## 2020-04-04 ENCOUNTER — Encounter: Payer: Self-pay | Admitting: Cardiology

## 2020-04-04 VITALS — BP 102/67 | HR 80 | Ht 65.0 in | Wt 150.8 lb

## 2020-04-04 DIAGNOSIS — E785 Hyperlipidemia, unspecified: Secondary | ICD-10-CM | POA: Diagnosis not present

## 2020-04-04 DIAGNOSIS — I10 Essential (primary) hypertension: Secondary | ICD-10-CM

## 2020-04-04 DIAGNOSIS — I2510 Atherosclerotic heart disease of native coronary artery without angina pectoris: Secondary | ICD-10-CM

## 2020-04-04 NOTE — Telephone Encounter (Signed)
Per Dr. Antoine Poche, called to offer smoking cessation information to patient. Pt is agreeable to receiving info. In addition to reading material sent to pt, also recommended 1-800-QUIT-NOW as a resource.

## 2020-04-04 NOTE — Patient Instructions (Addendum)
Medication Instructions:  No changes *If you need a refill on your cardiac medications before your next appointment, please call your pharmacy*   Lab Work: Hemoglobin A1c, Lipid and liver - FASTING If you have labs (blood work) drawn today and your tests are completely normal, you will receive your results only by: Marland Kitchen MyChart Message (if you have MyChart) OR . A paper copy in the mail If you have any lab test that is abnormal or we need to change your treatment, we will call you to review the results.   Testing/Procedures: None ordered   Follow-Up: At [facility], you and your health needs are our priority.  As part of our continuing mission to provide you with exceptional heart care, we have created designated Provider Care Teams.  These Care Teams include your primary Cardiologist (physician) and Advanced Practice Providers (APPs -  Physician Assistants and Nurse Practitioners) who all work together to provide you with the care you need, when you need it.  We recommend signing up for the patient portal called "MyChart".  Sign up information is provided on this After Visit Summary.  MyChart is used to connect with patients for Virtual Visits (Telemedicine).  Patients are able to view lab/test results, encounter notes, upcoming appointments, etc.  Non-urgent messages can be sent to your provider as well.   To learn more about what you can do with MyChart, go to [email or web address].    Your next appointment:   12 month(s)  The format for your next appointment:   In Person  Provider:   Rollene Rotunda, MD   Other Instructions None

## 2020-04-11 ENCOUNTER — Other Ambulatory Visit: Payer: Self-pay | Admitting: Cardiology

## 2020-04-11 NOTE — Addendum Note (Signed)
Addended by: Brunetta Genera on: 04/11/2020 01:49 PM   Modules accepted: Orders

## 2020-05-07 ENCOUNTER — Ambulatory Visit: Payer: 59 | Admitting: Psychiatry

## 2020-05-25 ENCOUNTER — Other Ambulatory Visit: Payer: Self-pay | Admitting: Cardiology

## 2020-06-02 ENCOUNTER — Other Ambulatory Visit: Payer: Self-pay | Admitting: Psychiatry

## 2020-06-02 DIAGNOSIS — F411 Generalized anxiety disorder: Secondary | ICD-10-CM

## 2020-06-02 DIAGNOSIS — F4001 Agoraphobia with panic disorder: Secondary | ICD-10-CM

## 2020-06-02 DIAGNOSIS — F5105 Insomnia due to other mental disorder: Secondary | ICD-10-CM

## 2020-06-09 ENCOUNTER — Other Ambulatory Visit: Payer: Self-pay | Admitting: Psychiatry

## 2020-06-09 DIAGNOSIS — F411 Generalized anxiety disorder: Secondary | ICD-10-CM

## 2020-07-01 ENCOUNTER — Ambulatory Visit: Payer: 59 | Admitting: Psychiatry

## 2020-07-07 ENCOUNTER — Observation Stay (HOSPITAL_COMMUNITY)
Admission: EM | Admit: 2020-07-07 | Discharge: 2020-07-09 | Disposition: A | Discharge status: Home / Self Care | Payer: 59 | Attending: Internal Medicine | Admitting: Internal Medicine

## 2020-07-07 ENCOUNTER — Emergency Department (HOSPITAL_COMMUNITY): Payer: 59

## 2020-07-07 ENCOUNTER — Encounter (HOSPITAL_COMMUNITY): Payer: Self-pay | Admitting: Emergency Medicine

## 2020-07-07 ENCOUNTER — Other Ambulatory Visit: Payer: Self-pay

## 2020-07-07 DIAGNOSIS — R079 Chest pain, unspecified: Secondary | ICD-10-CM

## 2020-07-07 DIAGNOSIS — F17210 Nicotine dependence, cigarettes, uncomplicated: Secondary | ICD-10-CM | POA: Insufficient documentation

## 2020-07-07 DIAGNOSIS — Z20822 Contact with and (suspected) exposure to covid-19: Secondary | ICD-10-CM | POA: Diagnosis not present

## 2020-07-07 DIAGNOSIS — I10 Essential (primary) hypertension: Secondary | ICD-10-CM | POA: Diagnosis not present

## 2020-07-07 DIAGNOSIS — E119 Type 2 diabetes mellitus without complications: Secondary | ICD-10-CM

## 2020-07-07 DIAGNOSIS — Z86711 Personal history of pulmonary embolism: Secondary | ICD-10-CM | POA: Diagnosis present

## 2020-07-07 DIAGNOSIS — Z7984 Long term (current) use of oral hypoglycemic drugs: Secondary | ICD-10-CM | POA: Diagnosis not present

## 2020-07-07 DIAGNOSIS — I25119 Atherosclerotic heart disease of native coronary artery with unspecified angina pectoris: Secondary | ICD-10-CM | POA: Diagnosis present

## 2020-07-07 DIAGNOSIS — I2699 Other pulmonary embolism without acute cor pulmonale: Principal | ICD-10-CM | POA: Insufficient documentation

## 2020-07-07 DIAGNOSIS — Z79899 Other long term (current) drug therapy: Secondary | ICD-10-CM | POA: Insufficient documentation

## 2020-07-07 DIAGNOSIS — Z87891 Personal history of nicotine dependence: Secondary | ICD-10-CM | POA: Diagnosis present

## 2020-07-07 DIAGNOSIS — Z7982 Long term (current) use of aspirin: Secondary | ICD-10-CM | POA: Diagnosis not present

## 2020-07-07 DIAGNOSIS — I2510 Atherosclerotic heart disease of native coronary artery without angina pectoris: Secondary | ICD-10-CM | POA: Diagnosis not present

## 2020-07-07 DIAGNOSIS — R0602 Shortness of breath: Secondary | ICD-10-CM

## 2020-07-07 DIAGNOSIS — I2693 Single subsegmental pulmonary embolism without acute cor pulmonale: Secondary | ICD-10-CM

## 2020-07-07 DIAGNOSIS — R778 Other specified abnormalities of plasma proteins: Secondary | ICD-10-CM

## 2020-07-07 DIAGNOSIS — E785 Hyperlipidemia, unspecified: Secondary | ICD-10-CM | POA: Diagnosis present

## 2020-07-07 DIAGNOSIS — E118 Type 2 diabetes mellitus with unspecified complications: Secondary | ICD-10-CM

## 2020-07-07 DIAGNOSIS — Z720 Tobacco use: Secondary | ICD-10-CM | POA: Diagnosis present

## 2020-07-07 DIAGNOSIS — F319 Bipolar disorder, unspecified: Secondary | ICD-10-CM | POA: Diagnosis present

## 2020-07-07 HISTORY — DX: Bipolar disorder, unspecified: F31.9

## 2020-07-07 LAB — CBC
HCT: 46.8 % — ABNORMAL HIGH (ref 36.0–46.0)
Hemoglobin: 16.6 g/dL — ABNORMAL HIGH (ref 12.0–15.0)
MCH: 30.1 pg (ref 26.0–34.0)
MCHC: 35.5 g/dL (ref 30.0–36.0)
MCV: 84.9 fL (ref 80.0–100.0)
Platelets: 192 10*3/uL (ref 150–400)
RBC: 5.51 MIL/uL — ABNORMAL HIGH (ref 3.87–5.11)
RDW: 12.3 % (ref 11.5–15.5)
WBC: 11.8 10*3/uL — ABNORMAL HIGH (ref 4.0–10.5)
nRBC: 0 % (ref 0.0–0.2)

## 2020-07-07 LAB — TROPONIN I (HIGH SENSITIVITY)
Troponin I (High Sensitivity): 33 ng/L — ABNORMAL HIGH (ref <18)
Troponin I (High Sensitivity): 37 ng/L — ABNORMAL HIGH (ref <18)

## 2020-07-07 LAB — BASIC METABOLIC PANEL
Anion gap: 16 — ABNORMAL HIGH (ref 5–15)
BUN: 18 mg/dL (ref 6–20)
CO2: 26 mmol/L (ref 22–32)
Calcium: 9.7 mg/dL (ref 8.9–10.3)
Chloride: 95 mmol/L — ABNORMAL LOW (ref 98–111)
Creatinine, Ser: 1.08 mg/dL — ABNORMAL HIGH (ref 0.44–1.00)
GFR, Estimated: 60 mL/min — ABNORMAL LOW (ref >60)
Glucose, Bld: 414 mg/dL — ABNORMAL HIGH (ref 70–99)
Potassium: 3.6 mmol/L (ref 3.5–5.1)
Sodium: 137 mmol/L (ref 135–145)

## 2020-07-07 LAB — I-STAT BETA HCG BLOOD, ED (MC, WL, AP ONLY): I-stat hCG, quantitative: <5 m[IU]/mL (ref <5)

## 2020-07-07 NOTE — ED Triage Notes (Signed)
Pt reports n/v 1 week ago, no longer having n/v but now c/o chest pain.

## 2020-07-08 ENCOUNTER — Other Ambulatory Visit: Payer: Self-pay

## 2020-07-08 ENCOUNTER — Encounter (HOSPITAL_COMMUNITY): Payer: Self-pay | Admitting: Internal Medicine

## 2020-07-08 ENCOUNTER — Emergency Department (HOSPITAL_COMMUNITY): Payer: 59

## 2020-07-08 DIAGNOSIS — Z720 Tobacco use: Secondary | ICD-10-CM

## 2020-07-08 DIAGNOSIS — I2699 Other pulmonary embolism without acute cor pulmonale: Secondary | ICD-10-CM | POA: Diagnosis not present

## 2020-07-08 DIAGNOSIS — Z86711 Personal history of pulmonary embolism: Secondary | ICD-10-CM

## 2020-07-08 DIAGNOSIS — I2510 Atherosclerotic heart disease of native coronary artery without angina pectoris: Secondary | ICD-10-CM | POA: Diagnosis not present

## 2020-07-08 DIAGNOSIS — I10 Essential (primary) hypertension: Secondary | ICD-10-CM

## 2020-07-08 DIAGNOSIS — F319 Bipolar disorder, unspecified: Secondary | ICD-10-CM

## 2020-07-08 DIAGNOSIS — E119 Type 2 diabetes mellitus without complications: Secondary | ICD-10-CM | POA: Diagnosis not present

## 2020-07-08 DIAGNOSIS — E785 Hyperlipidemia, unspecified: Secondary | ICD-10-CM

## 2020-07-08 HISTORY — DX: Personal history of pulmonary embolism: Z86.711

## 2020-07-08 LAB — HEMOGLOBIN A1C
Hgb A1c MFr Bld: 13.8 % — ABNORMAL HIGH (ref 4.8–5.6)
Mean Plasma Glucose: 349.36 mg/dL

## 2020-07-08 LAB — CBG MONITORING, ED
Glucose-Capillary: 338 mg/dL — ABNORMAL HIGH (ref 70–99)
Glucose-Capillary: 283 mg/dL — ABNORMAL HIGH (ref 70–99)
Glucose-Capillary: 315 mg/dL — ABNORMAL HIGH (ref 70–99)

## 2020-07-08 LAB — TSH: TSH: 0.254 u[IU]/mL — ABNORMAL LOW (ref 0.350–4.500)

## 2020-07-08 LAB — POC SARS CORONAVIRUS 2 AG -  ED: SARS Coronavirus 2 Ag: NEGATIVE

## 2020-07-08 LAB — HIV ANTIBODY (ROUTINE TESTING W REFLEX): HIV Screen 4th Generation wRfx: NONREACTIVE

## 2020-07-08 LAB — GLUCOSE, CAPILLARY
Glucose-Capillary: 241 mg/dL — ABNORMAL HIGH (ref 70–99)
Glucose-Capillary: 292 mg/dL — ABNORMAL HIGH (ref 70–99)

## 2020-07-08 LAB — TROPONIN I (HIGH SENSITIVITY)
Troponin I (High Sensitivity): 38 ng/L — ABNORMAL HIGH (ref <18)
Troponin I (High Sensitivity): 36 ng/L — ABNORMAL HIGH (ref <18)

## 2020-07-08 LAB — RESP PANEL BY RT-PCR (FLU A&B, COVID) ARPGX2
Influenza A by PCR: NEGATIVE
Influenza B by PCR: NEGATIVE
SARS Coronavirus 2 by RT PCR: NEGATIVE

## 2020-07-08 MED ORDER — MORPHINE SULFATE (PF) 2 MG/ML IV SOLN: 2.0000 mg | INTRAVENOUS | Status: DC | PRN

## 2020-07-08 MED ORDER — ONDANSETRON HCL 4 MG/2ML IJ SOLN: 4.0000 mg | Freq: Four times a day (QID) | INTRAMUSCULAR | Status: DC | PRN

## 2020-07-08 MED ORDER — ATORVASTATIN CALCIUM 40 MG PO TABS
40.0000 mg | ORAL_TABLET | Freq: Every day | ORAL | Status: DC
  Administered 2020-07-08: 40 mg via ORAL
  Filled 2020-07-08: qty 1

## 2020-07-08 MED ORDER — NICOTINE 14 MG/24HR TD PT24: 14.0000 mg | MEDICATED_PATCH | Freq: Every day | TRANSDERMAL | Status: DC | PRN

## 2020-07-08 MED ORDER — ACETAMINOPHEN 650 MG RE SUPP: 650.0000 mg | Freq: Four times a day (QID) | RECTAL | Status: DC | PRN

## 2020-07-08 MED ORDER — HEPARIN BOLUS VIA INFUSION
4000.0000 [IU] | Freq: Once | INTRAVENOUS | Status: DC
  Filled 2020-07-08: qty 4000

## 2020-07-08 MED ORDER — IOHEXOL 350 MG/ML SOLN
75.0000 mL | Freq: Once | INTRAVENOUS | Status: AC | PRN
  Administered 2020-07-08: 75 mL via INTRAVENOUS

## 2020-07-08 MED ORDER — DOCUSATE SODIUM 100 MG PO CAPS
100.0000 mg | ORAL_CAPSULE | Freq: Two times a day (BID) | ORAL | Status: DC
  Administered 2020-07-08 – 2020-07-09 (×3): 100 mg via ORAL
  Filled 2020-07-08 (×3): qty 1

## 2020-07-08 MED ORDER — BISACODYL 5 MG PO TBEC: 5.0000 mg | DELAYED_RELEASE_TABLET | Freq: Every day | ORAL | Status: DC | PRN

## 2020-07-08 MED ORDER — HYDROCODONE-ACETAMINOPHEN 5-325 MG PO TABS: 1.0000 | ORAL_TABLET | ORAL | Status: DC | PRN

## 2020-07-08 MED ORDER — HEPARIN (PORCINE) 25000 UT/250ML-% IV SOLN
950.0000 [IU]/h | INTRAVENOUS | Status: DC
  Filled 2020-07-08: qty 250

## 2020-07-08 MED ORDER — ALBUTEROL SULFATE (2.5 MG/3ML) 0.083% IN NEBU: 2.5000 mg | INHALATION_SOLUTION | RESPIRATORY_TRACT | Status: DC | PRN

## 2020-07-08 MED ORDER — ONDANSETRON HCL 4 MG PO TABS: 4.0000 mg | ORAL_TABLET | Freq: Four times a day (QID) | ORAL | Status: DC | PRN

## 2020-07-08 MED ORDER — DIVALPROEX SODIUM ER 500 MG PO TB24
1500.0000 mg | ORAL_TABLET | Freq: Every day | ORAL | Status: DC
  Administered 2020-07-08: 1500 mg via ORAL
  Filled 2020-07-08 (×2): qty 3

## 2020-07-08 MED ORDER — ALPRAZOLAM 0.5 MG PO TABS
1.0000 mg | ORAL_TABLET | Freq: Two times a day (BID) | ORAL | Status: DC
  Administered 2020-07-08 – 2020-07-09 (×3): 1 mg via ORAL
  Filled 2020-07-08: qty 4
  Filled 2020-07-08 (×2): qty 2

## 2020-07-08 MED ORDER — INSULIN ASPART 100 UNIT/ML ~~LOC~~ SOLN
0.0000 [IU] | Freq: Three times a day (TID) | SUBCUTANEOUS | Status: DC
  Administered 2020-07-08: 7 [IU] via SUBCUTANEOUS
  Administered 2020-07-08: 3 [IU] via SUBCUTANEOUS
  Administered 2020-07-09: 2 [IU] via SUBCUTANEOUS
  Administered 2020-07-09: 3 [IU] via SUBCUTANEOUS

## 2020-07-08 MED ORDER — HYDRALAZINE HCL 20 MG/ML IJ SOLN: 5.0000 mg | INTRAMUSCULAR | Status: DC | PRN

## 2020-07-08 MED ORDER — BUSPIRONE HCL 15 MG PO TABS
30.0000 mg | ORAL_TABLET | Freq: Two times a day (BID) | ORAL | Status: DC
  Administered 2020-07-08 – 2020-07-09 (×3): 30 mg via ORAL
  Filled 2020-07-08 (×2): qty 2
  Filled 2020-07-08: qty 3
  Filled 2020-07-08: qty 2

## 2020-07-08 MED ORDER — APIXABAN 5 MG PO TABS
10.0000 mg | ORAL_TABLET | Freq: Two times a day (BID) | ORAL | Status: DC
  Administered 2020-07-08 – 2020-07-09 (×3): 10 mg via ORAL
  Filled 2020-07-08 (×3): qty 2

## 2020-07-08 MED ORDER — APIXABAN 5 MG PO TABS: 5.0000 mg | ORAL_TABLET | Freq: Two times a day (BID) | ORAL | Status: DC

## 2020-07-08 MED ORDER — PAROXETINE HCL 20 MG PO TABS
40.0000 mg | ORAL_TABLET | Freq: Every day | ORAL | Status: DC
  Administered 2020-07-08 – 2020-07-09 (×2): 40 mg via ORAL
  Filled 2020-07-08 (×5): qty 2

## 2020-07-08 MED ORDER — METOPROLOL SUCCINATE ER 25 MG PO TB24
25.0000 mg | ORAL_TABLET | Freq: Every day | ORAL | Status: DC
  Administered 2020-07-08 – 2020-07-09 (×2): 25 mg via ORAL
  Filled 2020-07-08 (×2): qty 1

## 2020-07-08 MED ORDER — LIVING WELL WITH DIABETES BOOK
Freq: Once | Status: AC
  Filled 2020-07-08: qty 1

## 2020-07-08 MED ORDER — LIFITEGRAST 5 % OP SOLN: 1.0000 [drp] | Freq: Two times a day (BID) | OPHTHALMIC | Status: DC

## 2020-07-08 MED ORDER — QUETIAPINE FUMARATE 25 MG PO TABS
25.0000 mg | ORAL_TABLET | Freq: Every day | ORAL | Status: DC
  Administered 2020-07-08: 25 mg via ORAL
  Filled 2020-07-08: qty 1

## 2020-07-08 MED ORDER — ONDANSETRON HCL 4 MG/2ML IJ SOLN
4.0000 mg | Freq: Once | INTRAMUSCULAR | Status: AC
  Administered 2020-07-08: 4 mg via INTRAVENOUS
  Filled 2020-07-08: qty 2

## 2020-07-08 MED ORDER — INSULIN ASPART 100 UNIT/ML ~~LOC~~ SOLN
0.0000 [IU] | Freq: Every day | SUBCUTANEOUS | Status: DC
  Administered 2020-07-08: 3 [IU] via SUBCUTANEOUS

## 2020-07-08 MED ORDER — SODIUM CHLORIDE 0.9% FLUSH
3.0000 mL | Freq: Two times a day (BID) | INTRAVENOUS | Status: DC
  Administered 2020-07-08 – 2020-07-09 (×2): 3 mL via INTRAVENOUS

## 2020-07-08 MED ORDER — POLYETHYLENE GLYCOL 3350 17 G PO PACK: 17.0000 g | PACK | Freq: Every day | ORAL | Status: DC | PRN

## 2020-07-08 MED ORDER — ACETAMINOPHEN 500 MG PO TABS
1000.0000 mg | ORAL_TABLET | Freq: Once | ORAL | Status: AC
  Administered 2020-07-08: 1000 mg via ORAL
  Filled 2020-07-08: qty 2

## 2020-07-08 MED ORDER — FENTANYL CITRATE (PF) 100 MCG/2ML IJ SOLN
50.0000 ug | Freq: Once | INTRAMUSCULAR | Status: AC
  Administered 2020-07-08: 50 ug via INTRAVENOUS
  Filled 2020-07-08: qty 2

## 2020-07-08 MED ORDER — LACTATED RINGERS IV SOLN: INTRAVENOUS | Status: DC

## 2020-07-08 MED ORDER — SODIUM CHLORIDE 0.9 % IV BOLUS
1000.0000 mL | Freq: Once | INTRAVENOUS | Status: AC
  Administered 2020-07-08: 1000 mL via INTRAVENOUS

## 2020-07-08 MED ORDER — ACETAMINOPHEN 325 MG PO TABS: 650.0000 mg | ORAL_TABLET | Freq: Four times a day (QID) | ORAL | Status: DC | PRN

## 2020-07-08 NOTE — ED Provider Notes (Signed)
Care assumed from [place], PA-C at shift change with CTA and cards eval pending.   In brief, this patient is a 60 y.o. F with PMH/o CABG who presents for evaluation of chest pain, SOB, and abdominal pain. She has had some associated nausea/vomting. No fever, cough, chills. Please see note from previous provider for full history/physical exam.    Physical Exam  BP 99/77   Pulse 79   Temp 99.6 F (37.6 C) (Oral)   Resp 16   Ht 5' 5" (1.651 m)   Wt 59 kg Comment: Patient reports she has lost a lot of weight  LMP 11/28/2009   SpO2 97%   BMI 21.63 kg/m   Physical Exam   NAD No evidence of respiratory distress.   ED Course/Procedures     Procedures   Results for orders placed or performed during the hospital encounter of 07/07/20 (from the past 24 hour(s))  Troponin I (High Sensitivity)     Status: Abnormal   Collection Time: 07/07/20  3:10 PM  Result Value Ref Range   Troponin I (High Sensitivity) 37 (H) <18 ng/L  POC SARS Coronavirus 2 Ag-ED - Nasal Swab (BD Veritor Kit)     Status: None   Collection Time: 07/08/20  6:02 AM  Result Value Ref Range   SARS Coronavirus 2 Ag NEGATIVE NEGATIVE  CBG monitoring, ED     Status: Abnormal   Collection Time: 07/08/20  6:13 AM  Result Value Ref Range   Glucose-Capillary 338 (H) 70 - 99 mg/dL     MDM   PLAN: Patient pending CTA.  If negative, will consult cards regarding patient's disposition.  MDM: Trop 33.  Her delta troponin is 37.  This is a slight bump from her.  BMP shows glucose of 414, BUN and creatinine within normal limits.  CBC shows leukocytosis of 11.8.  Hemoglobin 16.6.  I-STAT beta is negative.  Covid is negative.  CTA shows a small proximal segmental left upper lobe pulmonary embolus.  Low thrombus burden.  No evidence of heart strain.  At this time, patient is hemodynamically stable but she has a bump in her troponin which is new for her.  Given her history of CABG as well as her elevated troponins and PE  seen on today's scan, feel that admission is warranted.   Discussed patient with Dr. Lorin Mercy (hospitalist) who accepts patient for admission.  She recommends starting patient on Eliquis.  She would like cardiology consulted given patient's EKG changes as well as elevated troponins.  Discussed with Dr. Percival Spanish (Cards) who evaluated patient's EKG and tropes.  Given that her troponin from 34-37, this is not considered a delta.  He agrees with plan for admission, anticoagulation, trending troponins and repeating EKG.  Dr. Lorin Mercy updated on plan.    1. Chest pain, unspecified type   2. SOB (shortness of breath)   3. Single subsegmental pulmonary embolism without acute cor pulmonale (HCC)   4. Elevated troponin    Portions of this note were generated with Dragon dictation software. Dictation errors may occur despite best attempts at proofreading.     Volanda Napoleon, PA-C 07/08/20 1124    Little, Wenda Overland, MD 07/08/20 [phone]

## 2020-07-08 NOTE — H&P (Signed)
History and Physical    Bailey Greene KKX:[id number] DOB: 1961-01-27 DOA: 07/07/2020  PCP: Sandford Craze, NP Consultants:  [facility] - cardiology; [facility] - psychiatry Patient coming from:  Home - lives with husband; NOK: Gayla Doss Drenda Freeze, [phone]; Daughter, Azelie Noguera, [phone]  Chief Complaint: chest pain  HPI: Bailey Greene is a 60 y.o. female with medical history significant of HTN; HLD: DM; bipolar d/o; and CAD s/p CABG presenting with chest pain.  She had n/v and anorexia Monday-Tuesday last week, still with anorexia and chest discomfort the rest of the week and until yesterday.  This prompted her to come to the ER.   She had been SOB, worse with exertion.  No URI symptoms.  No known COVID contacts.  No LE edema.  N/V/D has resolved.  She thought she might have food poisoning.  No one else in the house has this.    ED Course:  Small PE.  H/o CABG, presented with CP/SOB.  P125 on arrival.  Troponin 34, 38.  COVID negative.  CTA +.  Will start Eliquis.   Review of Systems: As per HPI; otherwise review of systems reviewed and negative.   Ambulatory Status:  Ambulates without assistance  COVID Vaccine Status:  Complete plus booster  Past Medical History:  Diagnosis Date  . Anxiety   . Bipolar affective disorder (HCC)   . CAD (coronary artery disease) 2006   CABG w/ LIMA-LAD, RIMA-Diag, SVG-OM1-OM2, R radial-PDA  . 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   Qualifier: Diagnosis of  By: Cheri Guppy    . Diabetes mellitus type 2 in nonobese (HCC) 07/2016  . Dyslipidemia   . Headache(784.0)   . HTN (hypertension)   . Migraine     Past Surgical History:  Procedure Laterality Date  . BUNIONECTOMY  08/2011   right foot  . CARDIAC CATHETERIZATION  2007   severe native 3 v dz, all grafts patent (LIMA-LAD, RIMA-Diag, SVG-OM1-OM2, R radial-PDA)  . CESAREAN SECTION  1984  . CORONARY ARTERY BYPASS GRAFT  2006    Coronary artery bypass grafting x5 with a left   internal  mammary to the left anterior descending coronary artery.  Free right  internal mammary to the diagonal coronary artery, sequential reverse  saphenous vein graft to the first and second obtuse marginal, right  artery bypass to the posterior descending coronary artery with endo-vein harvesting.    Social History   Socioeconomic History  . Marital status: Legally Separated    Spouse name: Not on file  . Number of children: Not on file  . Years of education: Not on file  . Highest education level: Not on file  Occupational History  . Occupation: Armed forces training and education officer  Tobacco Use  . Smoking status: Current Every Day Smoker    Packs/day: 0.50    Years: 25.00    Pack years: 12.50    Types: Cigarettes  . Smokeless tobacco: Never Used  Substance and Sexual Activity  . Alcohol use: No    Alcohol/week: 0.0 standard drinks  . Drug use: Never  . Sexual activity: Yes    Partners: Male    Comment: married  Other Topics Concern  . Not on file  Social History Narrative   Regular exercise:  3 days weekly   Caffeine Use:  1 soda daily   One child biological daughter born in 72 and an adopted niece.   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  Social Determinants of Health   Financial Resource Strain: Not on file  Food Insecurity: Not on file  Transportation Needs: Not on file  Physical Activity: Not on file  Stress: Not on file  Social Connections: Not on file  Intimate Partner Violence: Not on file    No Known Allergies  Family History  Problem Relation Age of Onset  . Lupus Mother   . Heart attack Father   . Diabetes Father   . Hypertension Father   . Diabetes Paternal Grandmother   . Hypertension Paternal Grandmother   . Stroke Neg Hx   . Kidney disease Neg Hx   . Hyperlipidemia Neg Hx   . Sudden death Neg Hx     Prior to Admission medications   Medication Sig Start Date End Date Taking? Authorizing Provider   ALPRAZolam Prudy Feeler) 0.5 MG tablet 2 tabs for anxiety bid Patient taking differently: Take 1 mg by mouth 2 (two) times daily. 03/05/20  Yes [facility], Steva Ready., MD  aspirin 81 MG chewable tablet Chew 81 mg by mouth daily.   Yes [provider]  busPIRone (BUSPAR) 30 MG tablet TAKE 1 TABLET BY MOUTH 2 TIMES DAILY. Patient taking differently: Take 30 mg by mouth 2 times daily at 12 noon and 4 pm. 06/09/20  Yes [facility], Steva Ready., MD  divalproex (DEPAKOTE ER) 500 MG 24 hr tablet Take 3 tablets (1,500 mg total) by mouth at bedtime. 03/05/20  Yes [facility], Steva Ready., MD  glucose blood (ACCU-CHEK GUIDE) test strip Use to check blood sugar once a day.  DX  E11.65 08/18/16  Yes Sandford Craze, NP  metoprolol succinate (TOPROL-XL) 25 MG 24 hr tablet TAKE 1 TABLET BY MOUTH EVERY DAY Patient taking differently: Take 25 mg by mouth daily. 04/11/20  Yes Rollene Rotunda, MD  PARoxetine (PAXIL) 20 MG tablet TAKE 2 TABLETS BY MOUTH EVERY DAY Patient taking differently: Take 40 mg by mouth daily. 06/02/20  Yes [facility], Steva Ready., MD  QUEtiapine (SEROQUEL) 25 MG tablet TAKE 1-2 TABLETS (25-50 MG TOTAL) BY MOUTH AT BEDTIME. Patient taking differently: Take 25 mg by mouth at bedtime. 06/02/20  Yes [facility], Steva Ready., MD  simvastatin (ZOCOR) 80 MG tablet TAKE 1 TABLET BY MOUTH EVERYDAY AT BEDTIME Patient taking differently: Take 80 mg by mouth at bedtime. 05/26/20  Yes Rollene Rotunda, MD  XIIDRA 5 % SOLN Place 1 drop into both eyes in the morning and at bedtime. 05/29/20  Yes [provider]    Physical Exam: Vitals:   07/08/20 0840 07/08/20 0930 07/08/20 1000 07/08/20 1030  BP: 114/74 124/75 111/72 99/77  Pulse: 85 82 73 79  Resp: 13  16 16   Temp:      TempSrc:      SpO2: 98% 100% 96% 97%  Weight:   59 kg   Height:   5\' 5"  (1.651 m)      . General:  Appears calm and comfortable and is in NAD, appears fatigued . Eyes:  PERRL, EOMI, normal lids, iris . ENT:  grossly normal hearing,  lips & tongue, mmm; artificial dentition, some absent . Neck:  no LAD, masses or thyromegaly . Cardiovascular:  RRR, no m/r/g. No LE edema.  Respiratory:   CTA bilaterally with no wheezes/rales/rhonchi.  Normal respiratory effort. . Abdomen:  soft, NT, ND, NABS . Skin:  no rash or induration seen on limited exam . Musculoskeletal:  grossly normal tone BUE/BLE, good ROM, no bony abnormality . Lower  extremity:  No LE edema.  2+ distal pulses. Marland Kitchen Psychiatric:  Flat mood and affect, speech fluent and appropriate, AOx3 . Neurologic:  CN 2-12 grossly intact, moves all extremities in coordinated fashion    Radiological Exams on Admission: Independently reviewed - see discussion in A/P where applicable  DG Chest 2 View  Result Date: 07/07/2020 CLINICAL DATA:  Chest pain with cough and shortness of breath EXAM: CHEST - 2 VIEW COMPARISON:  July 18, 2017 FINDINGS: Lungs are clear. Heart size and pulmonary vascularity are normal. Patient is status post coronary artery bypass grafting. There is aortic atherosclerosis. No adenopathy. No pneumothorax. No appreciable bone lesions. IMPRESSION: Lungs clear. Heart size normal. Status post coronary artery bypass grafting. Aortic Atherosclerosis (ICD10-I70.0). Electronically Signed   By: Bretta Bang III M.D.   On: 07/07/2020 10:50   CT Angio Chest PE W and/or Wo Contrast  Result Date: 07/08/2020 CLINICAL DATA:  Chest pain, shortness of breath for 1 week, remote coronary bypass 2006 EXAM: CT ANGIOGRAPHY CHEST WITH CONTRAST TECHNIQUE: Multidetector CT imaging of the chest was performed using the standard protocol during bolus administration of intravenous contrast. Multiplanar CT image reconstructions and MIPs were obtained to evaluate the vascular anatomy. CONTRAST:  43mL OMNIPAQUE IOHEXOL 350 MG/ML SOLN COMPARISON:  04/20/2007 FINDINGS: Cardiovascular: Within a proximal left upper lobe segmental branch to the lingula, there is a small hypodense filling  defect at a branch point, image 64 series 5 compatible with a small left pulmonary embolus. Very low thrombus burden. No significant central or proximal hilar large pulmonary embolus. Right pulmonary arteries appear patent. No evidence of heart strain by CTA. Aorta atherosclerotic. Negative for aneurysm or dissection. Patent 3 vessel arch anatomy. Previous coronary bypass changes noted. Normal heart size. Left ventricular wall thickening suggesting LVH. No pericardial effusion. Mediastinum/Nodes: Nonspecific mild thyroid enlargement. Trachea and central airways appear patent. Esophagus nondilated. No hiatal hernia. Negative for adenopathy. Lungs/Pleura: Minor dependent bibasilar and inferior lingula subpleural atelectasis. Otherwise no acute airspace process, significant collapse or consolidation. No interstitial process or edema. Trachea central airways are patent. No pleural abnormality, effusion or pneumothorax. Upper Abdomen: No acute abnormality. Musculoskeletal: Remote median sternotomy. Minor midthoracic degenerative change. No acute osseous finding. Review of the MIP images confirms the above findings. IMPRESSION: Small proximal segmental left upper lobe pulmonary embolus. Very low thrombus burden. No evidence of heart strain. No significant central or proximal hilar large PE. Inferior lingula and bibasilar atelectasis Aortic Atherosclerosis (ICD10-I70.0). Electronically Signed   By: Judie Petit.  Shick M.D.   On: 07/08/2020 08:49    EKG: Independently reviewed.  Sinus tachycardia with rate 127; PVCs, LVH; significant ST depression that is clearly different from prior   Labs on Admission: I have personally reviewed the available labs and imaging studies at the time of the admission.  Pertinent labs:   Glucose 414 BUN 18/Creatinine 1.08/GFR 60 HS troponin 33, 37 POC COVID negative; PCR pending   Assessment/Plan Principal Problem:   Pulmonary embolism (HCC) Active Problems:   Essential hypertension    Coronary atherosclerosis   Tobacco abuse   Hyperlipidemia with target LDL less than 70   Diabetes mellitus type 2 in nonobese (HCC)   Bipolar affective disorder (HCC)   PE -Patient without prior episodes of thromboembolic disease presenting with new PE -Small clot burden, comfortable on room air -Will observe overnight on telemetry -Initiate anticoagulation - will start Eliquis -[place] O2 as needed - currently on room air -We discussed oral AC treatment options and risks/benefits including frequent  MD visits and dietary regulation with Coumadin vs. Significant expense with DOAC therapy -Will request TOC team consultation to assist with cost analysis of the various DOAC options based on her insurance -Smoking cessation has been strongly encouraged -The patient understands that thromboembolic disease can be catastrophic and even deadly and that she must be complaint with physician appointments and anticoagulation. -Mildly elevated troponin with EKG changes are likely associated with strain/demand ischemia; will continue to trend troponin and repeat EKG -Discussed with cardiology (Dr. Antoine Poche), who reviewed EKG and is happy to consult if needed.  DM -Patient's husband is diabetic and they both report that she has never been told she was diabetic -She had an A1c of 7.8 in 07/2017 -Will start sensitive-scale SSI for now -Diabetes coordinator consultation -Nutrition consult -We discussed long-term ramifications of uncontrolled DM and the importance of outpatient PCP f/u  N/V/D -Resolved but present last week -Possibly related to food poisoning - but also could be associated with uncontrolled DM -She was less mobile and this may have led to clot -She has continued anorexia -PO intake encouraged  HTN -Continue Toprol XL  HLD -Substitute Lipitor 40 mg daily for Zocor 80 mg as per formulary recommendations  Bipolar d/o -Continue Xanax, Buspar, Depakote, Paxil, Seroquel  CAD -s/p  CABG -Continue ASA  Tobacco dependence -Encourage cessation.   -This was discussed with the patient and should be reviewed on an ongoing basis.   -Patch ordered for prn use    Note: This patient has been tested and is negative for the novel coronavirus COVID-19. The patient has been fully vaccinated against COVID-19.   Level of care: Telemetry Cardiac DVT prophylaxis: Eliquis Code Status:  Full - confirmed with patient/family Family Communication: None present; I spoke with the patient's daughter by telephone at the time of admission. Disposition Plan:  The patient is from: home  Anticipated d/c is to: home without [facility] services  Anticipated d/c date will depend on clinical response to treatment, but possibly as early as tomorrow if she has excellent response to treatment  Patient is currently: acutely ill Consults called: TOC team; DM coordinator; nutrition Admission status:  It is my clinical opinion that referral for OBSERVATION is reasonable and necessary in this patient based on the above information provided. The aforementioned taken together are felt to place the patient at high risk for further clinical deterioration. However it is anticipated that the patient may be medically stable for discharge from the hospital within 24 to 48 hours.    Jonah Blue MD [facility]   How to contact the [facility] Attending or Consulting provider 7A - 7P or covering provider during after hours 7P -7A, for this patient?  1. Check the care team in [facility] and look for a) attending/consulting TR[facility] provider listed and b) the [facility] team listed 2. Log into [email or web address] and use [facility] universal password to access. If you do not have the password, please contact the hospital operator. 3. Locate the [facility] provider you are looking for under [facility] and page to a number that you can be directly reached. 4. If you still have difficulty reaching the provider, please page the [facility] (Director on  Call) for the Hospitalists listed on amion for assistance.   07/08/2020, 1:09 PM

## 2020-07-08 NOTE — Discharge Instructions (Signed)
Information on my medicine - ELIQUIS (apixaban)  This medication education was reviewed with me or my healthcare representative as part of my discharge preparation.    Why was Eliquis prescribed for you? Eliquis was prescribed to treat blood clots that may have been found in the veins of your legs (deep vein thrombosis) or in your lungs (pulmonary embolism) and to reduce the risk of them occurring again.  What do You need to know about Eliquis ? The starting dose is 10 mg (two 5 mg tablets) taken TWICE daily for the FIRST SEVEN (7) DAYS, then on (enter date)  07/15/20  the dose is reduced to ONE 5 mg tablet taken TWICE daily.  Eliquis may be taken with or without food.   Try to take the dose about the same time in the morning and in the evening. If you have difficulty swallowing the tablet whole please discuss with your pharmacist how to take the medication safely.  Take Eliquis exactly as prescribed and DO NOT stop taking Eliquis without talking to the doctor who prescribed the medication.  Stopping may increase your risk of developing a new blood clot.  Refill your prescription before you run out.  After discharge, you should have regular check-up appointments with your healthcare provider that is prescribing your Eliquis.    What do you do if you miss a dose? If a dose of ELIQUIS is not taken at the scheduled time, take it as soon as possible on the same day and twice-daily administration should be resumed. The dose should not be doubled to make up for a missed dose.  Important Safety Information A possible side effect of Eliquis is bleeding. You should call your healthcare provider right away if you experience any of the following: ? Bleeding from an injury or your nose that does not stop. ? Unusual colored urine (red or dark brown) or unusual colored stools (red or black). ? Unusual bruising for unknown reasons. ? A serious fall or if you hit your head (even if there is no  bleeding).  Some medicines may interact with Eliquis and might increase your risk of bleeding or clotting while on Eliquis. To help avoid this, consult your healthcare provider or pharmacist prior to using any new prescription or non-prescription medications, including herbals, vitamins, non-steroidal anti-inflammatory drugs (NSAIDs) and supplements.  This website has more information on Eliquis (apixaban): http://www.eliquis.com/eliquis/home   DIABETES:  Go to the following link and fill out basic information for Lantus Copay savings card and show to pharmacy Lantus savings card link [email or web address]://[email or web address]-[email or web address]

## 2020-07-08 NOTE — ED Provider Notes (Signed)
[facility] EMERGENCY DEPARTMENT Provider Note   CSN: [id number] Arrival date & time: 07/07/20  1014     History Chief Complaint  Patient presents with  . Chest Pain    Bailey Greene is a 60 y.o. female.  Patient with past medical history notable for CABG presents to the emergency department with chief complaint of chest pain, shortness of breath, and abdominal pain.  She reports associated nausea and vomiting.  She denies any fever, chills, cough.  She states that she has been having the symptoms for about a week.  She denies any successful treatments prior to arrival.  States that she has not been tolerating any oral intake for the past day or 2.  She states that due to the persistent symptoms, she came in department for evaluation of her she denies any known Covid exposure.  She is not anticoagulated.  The history is provided by the patient. No language interpreter was used.       Past Medical History:  Diagnosis Date  . Anxiety   . CAD (coronary artery disease) 2006   CABG w/ LIMA-LAD, RIMA-Diag, SVG-OM1-OM2, R radial-PDA  . 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   Qualifier: Diagnosis of  By: Cheri Guppy    . Diabetes mellitus type 2 in nonobese (HCC) 07/2016  . Dyslipidemia   . Headache(784.0)   . HTN (hypertension)   . Hyperlipidemia   . Migraine     Patient Active Problem List   Diagnosis Date Noted  . Tobacco abuse   . Hyperlipidemia with target LDL less than 70   . Hyperglycemia 07/18/2017  . Chest pain with moderate risk for cardiac etiology 12/27/2014  . 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 Carpal tunnel syndrome 05/03/2013  . Loss of weight 08/23/2012  . General medical examination 07/06/2011  . Goiter 09/23/2010  . Dyslipidemia 01/31/2007  . Anxiety state 01/31/2007  . 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  . Essential hypertension 01/31/2007  . Coronary atherosclerosis 01/31/2007    Past Surgical History:  Procedure Laterality Date  . BUNIONECTOMY   08/2011   right foot  . CARDIAC CATHETERIZATION  2007   severe native 3 v dz, all grafts patent (LIMA-LAD, RIMA-Diag, SVG-OM1-OM2, R radial-PDA)  . CESAREAN SECTION  1984  . CORONARY ARTERY BYPASS GRAFT  2006    Coronary artery bypass grafting x5 with a left  internal  mammary to the left anterior descending coronary artery.  Free right  internal mammary to the diagonal coronary artery, sequential reverse  saphenous vein graft to the first and second obtuse marginal, right  artery bypass to the posterior descending coronary artery with endo-vein harvesting.     OB History   No obstetric history on file.     Family History  Problem Relation Age of Onset  . Lupus Mother   . Heart attack Father   . Diabetes Father   . Hypertension Father   . Diabetes Paternal Grandmother   . Hypertension Paternal Grandmother   . Stroke Neg Hx   . Kidney disease Neg Hx   . Hyperlipidemia Neg Hx   . Sudden death Neg Hx     Social History   Tobacco Use  . Smoking status: Current Some Day Smoker    Packs/day: 0.50    Years: 8.00    Pack years: 4.00    Types: Cigarettes    Last attempt to quit: 12/25/2012    Years since quitting: 7.5  . Smokeless tobacco: Never Used  Substance Use Topics  .  Alcohol use: No    Alcohol/week: 0.0 standard drinks  . Drug use: Never    Home Medications Prior to Admission medications   Medication Sig Start Date End Date Taking? Authorizing Provider  ALPRAZolam Prudy Feeler) 0.5 MG tablet 2 tabs for anxiety bid 03/05/20   Cottle, Steva Ready., MD  aspirin 81 MG chewable tablet Chew 81 mg by mouth daily.    [provider]  busPIRone (BUSPAR) 30 MG tablet TAKE 1 TABLET BY MOUTH 2 TIMES DAILY. 06/09/20   Cottle, Steva Ready., MD  divalproex (DEPAKOTE ER) 500 MG 24 hr tablet Take 3 tablets (1,500 mg total) by mouth at bedtime. 03/05/20   Cottle, Steva Ready., MD  glucose blood (ACCU-CHEK GUIDE) test strip Use to check blood sugar once a day.  DX  E11.65 08/18/16    Sandford Craze, NP  metoprolol succinate (TOPROL-XL) 25 MG 24 hr tablet TAKE 1 TABLET BY MOUTH EVERY DAY 04/11/20   Rollene Rotunda, MD  PARoxetine (PAXIL) 20 MG tablet TAKE 2 TABLETS BY MOUTH EVERY DAY 06/02/20   Cottle, Steva Ready., MD  QUEtiapine (SEROQUEL) 25 MG tablet TAKE 1-2 TABLETS (25-50 MG TOTAL) BY MOUTH AT BEDTIME. 06/02/20   Cottle, Steva Ready., MD  simvastatin (ZOCOR) 80 MG tablet TAKE 1 TABLET BY MOUTH EVERYDAY AT BEDTIME 05/26/20   Rollene Rotunda, MD    Allergies    Patient has no known allergies.  Review of Systems   Review of Systems  All other systems reviewed and are negative.   Physical Exam Updated Vital Signs BP 100/85 (BP Location: Right Arm)   Pulse (!) 119   Temp 99.6 F (37.6 C) (Oral)   Resp 16   LMP 11/28/2009   SpO2 95%   Physical Exam Vitals and nursing note reviewed.  Constitutional:      General: She is not in acute distress.    Appearance: She is well-developed and well-nourished.  HENT:     Head: Normocephalic and atraumatic.  Eyes:     Conjunctiva/sclera: Conjunctivae normal.  Cardiovascular:     Rate and Rhythm: Regular rhythm. Tachycardia present.     Heart sounds: No murmur heard.   Pulmonary:     Effort: Pulmonary effort is normal. No respiratory distress.     Breath sounds: Normal breath sounds.  Abdominal:     Palpations: Abdomen is soft.     Tenderness: There is no abdominal tenderness.     Comments: Generalized abdominal discomfort, but no focal tenderness  Musculoskeletal:        General: No edema. Normal range of motion.     Cervical back: Neck supple.     Comments: No lower extremity edema, calf swelling, or tenderness  Skin:    General: Skin is warm and dry.  Neurological:     Mental Status: She is alert and oriented to person, place, and time.  Psychiatric:        Mood and Affect: Mood and affect and mood normal.        Behavior: Behavior normal.     ED Results / Procedures / Treatments   Labs (all labs  ordered are listed, but only abnormal results are displayed) Labs Reviewed  BASIC METABOLIC PANEL - Abnormal; Notable for the following components:      Result Value   Chloride 95 (*)    Glucose, Bld 414 (*)    Creatinine, Ser 1.08 (*)    GFR, Estimated 60 (*)    Anion gap  16 (*)    All other components within normal limits  CBC - Abnormal; Notable for the following components:   WBC 11.8 (*)    RBC 5.51 (*)    Hemoglobin 16.6 (*)    HCT 46.8 (*)    All other components within normal limits  TROPONIN I (HIGH SENSITIVITY) - Abnormal; Notable for the following components:   Troponin I (High Sensitivity) 33 (*)    All other components within normal limits  TROPONIN I (HIGH SENSITIVITY) - Abnormal; Notable for the following components:   Troponin I (High Sensitivity) 37 (*)    All other components within normal limits  I-STAT BETA HCG BLOOD, ED ([facility], [facility], [facility] ONLY)  POC SARS CORONAVIRUS 2 AG -  ED    EKG None  Radiology DG Chest 2 View  Result Date: 07/07/2020 CLINICAL DATA:  Chest pain with cough and shortness of breath EXAM: CHEST - 2 VIEW COMPARISON:  July 18, 2017 FINDINGS: Lungs are clear. Heart size and pulmonary vascularity are normal. Patient is status post coronary artery bypass grafting. There is aortic atherosclerosis. No adenopathy. No pneumothorax. No appreciable bone lesions. IMPRESSION: Lungs clear. Heart size normal. Status post coronary artery bypass grafting. Aortic Atherosclerosis (ICD10-I70.0). Electronically Signed   By: Bretta Bang III M.D.   On: 07/07/2020 10:50    Procedures Procedures   Medications Ordered in ED Medications  acetaminophen (TYLENOL) tablet 1,000 mg (has no administration in time range)  fentaNYL (SUBLIMAZE) injection 50 mcg (has no administration in time range)  ondansetron (ZOFRAN) injection 4 mg (has no administration in time range)  sodium chloride 0.9 % bolus 1,000 mL (has no administration in time range)    ED Course  I  have reviewed the triage vital signs and the nursing notes.  Pertinent labs & imaging results that were available during my care of the patient were reviewed by me and considered in my medical decision making (see chart for details).    MDM Rules/Calculators/A&P                         This patient complains of CP, SOB, n/v x 1 week, this involves an extensive number of treatment options, and is a complaint that carries with it a high risk of complications and morbidity.    Differential Dx PE, ACS, pneumonia, COVID  Pertinent Labs I reviewed the labs which were ordered in triage, patient has mildly elevated creatinine at 1.08, glucose is 414, troponin is 33, repeat is 37, mildly elevated anion gap.  Could be secondary to dehydration, will give some fluid.  Imaging Interpretation I ordered imaging studies which included CT PE is pending, chest x-ray reviewed and interpreted by myself shows no obvious acute abnormality.  Medications I ordered medication fentanyl and Zofran for pain and nausea.  Plan Patient signed out to [place], PA-C at shift change pending CT PE.  If PE study is negative, consider cardiology consultation for chest pain or shortness of breath in setting of mildly elevated troponins.    Final Clinical Impression(s) / ED Diagnoses Final diagnoses:  Chest pain, unspecified type  SOB (shortness of breath)    Rx / DC Orders ED Discharge Orders    None       Roxy Horseman, PA-C 07/08/20 [id number]    Marily Memos, MD 07/08/20 [phone]

## 2020-07-08 NOTE — ED Notes (Signed)
Lunch tray ordered 

## 2020-07-08 NOTE — Progress Notes (Signed)
Patient arrived to 5C19 in NAD, VS stable. Patient oriented to room and call bell in reach.

## 2020-07-08 NOTE — Progress Notes (Addendum)
ANTICOAGULATION CONSULT NOTE - Initial Consult  Pharmacy Consult for apixaban Indication: pulmonary embolus  No Known Allergies  Patient Measurements: Weight: 59 kg (130 lb) (Patient reports she has lost a lot of weight) Heparin Dosing Weight: 59kg  Vital Signs: Temp: 99.6 F (37.6 C) (02/07 2256) Temp Source: Oral (02/07 2256) BP: 114/74 (02/08 0840) Pulse Rate: 85 (02/08 0840)  Labs: Recent Labs    07/07/20 1038 07/07/20 1510  HGB 16.6*  --   HCT 46.8*  --   PLT 192  --   CREATININE 1.08*  --   TROPONINIHS 33* 37*    Estimated Creatinine Clearance: 51.7 mL/min (A) (by C-G formula based on SCr of 1.08 mg/dL (H)).   Medical History: Past Medical History:  Diagnosis Date  . Anxiety   . CAD (coronary artery disease) 2006   CABG w/ LIMA-LAD, RIMA-Diag, SVG-OM1-OM2, R radial-PDA  . 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   Qualifier: Diagnosis of  By: Cheri Guppy    . Diabetes mellitus type 2 in nonobese (HCC) 07/2016  . Dyslipidemia   . Headache(784.0)   . HTN (hypertension)   . Migraine     Medications:  Scheduled:  . heparin  4,000 Units Intravenous Once    Assessment: 60 yo admitted with shortness of breath, found to have L upper lobe pulmonary embolus.  No evidence of heart strain. No anticoagulation PTA. CBC stable. Pharmacy consulted to dose apixaban.  Goal of Therapy:  Monitor platelets by anticoagulation protocol: Yes   Plan:  Apixaban 10mg  BID x 7 days, then Apixaban 5mg  BID thereafter Monitor s/sx bleeding  , PharmD PGY-1 Acute Care Pharmacy Resident Office: [phone] 07/08/2020 10:15 AM

## 2020-07-08 NOTE — Progress Notes (Signed)
Inpatient Diabetes Program Recommendations  AACE/ADA: New Consensus Statement on Inpatient Glycemic Control (2015)  Target Ranges:  Prepandial:   less than 140 mg/dL      Peak postprandial:   less than 180 mg/dL (1-2 hours)      Critically ill patients:  140 - 180 mg/dL   Lab Results  Component Value Date   GLUCAP 315 (H) 07/08/2020   HGBA1C 13.8 (H) 07/08/2020    Review of Glycemic Control  Diabetes history: New diagnosis this admission  Pt on metformin (intolerance) and Januvia in the past see PCP note 08/2017  Current orders for Inpatient glycemic control:  Novolog 0-9 units tid + hs  A1c 13.8 this admission  Inpatient Diabetes Program Recommendations:    -  Add Lantus 10 units  -  Consider Januvia at time of d/c in addition to insulin  D/C: Lantus solostar insulin pen order # [id number] Insulin pen needles order # [id number] Glucose meter kit order # [id number]  Spoke with patient about new diabetes diagnosis. In reviewing pt chart pt had been placed on metformin in the past and due to intolerance (diarrhea) was placed on Januvia back in 08/2017. Pt has not seen her PCP since 08/2017 due to pandemic and working from home. Discussed basic pathophysiology of DM Type 2, basic home care, importance of checking CBGs and maintaining good CBG control to prevent long-term and short-term complications. Reviewed glucose and A1C goals.  Reviewed signs and symptoms of hyperglycemia and hypoglycemia along with treatment for both. Discussed impact of nutrition, exercise, stress, sickness, and medications on diabetes control. Reviewed Living Well with diabetes booklet and encouraged patient to read through entire book. Informed patient that she maybe prescribed long acting insulin at time of d/c. Discussed and showed pt and husband how to deliver insulin with insulin pen. discussed dietary changes in detail.  Asked patient to check her glucose 2 times per day and to keep a log book of glucose readings  and insulin taken. Explained how the doctor he follows up with can use the log book to continue to make insulin adjustments if needed. RNs to provide ongoing basic DM education at bedside with this patient and engage patient to actively check blood glucose and administer insulin injections.    Lantus savings card link (placed link in AVS) [email or web address]  Thanks,  Tama Headings RN, MSN, BC-ADM Inpatient Diabetes Coordinator Team Pager [phone] (8a-5p)

## 2020-07-09 ENCOUNTER — Other Ambulatory Visit: Payer: Self-pay | Admitting: Internal Medicine

## 2020-07-09 DIAGNOSIS — E119 Type 2 diabetes mellitus without complications: Secondary | ICD-10-CM

## 2020-07-09 DIAGNOSIS — I2699 Other pulmonary embolism without acute cor pulmonale: Secondary | ICD-10-CM

## 2020-07-09 LAB — CBC
HCT: 40.6 % (ref 36.0–46.0)
Hemoglobin: 13.8 g/dL (ref 12.0–15.0)
MCH: 29 pg (ref 26.0–34.0)
MCHC: 34 g/dL (ref 30.0–36.0)
MCV: 85.3 fL (ref 80.0–100.0)
Platelets: 161 10*3/uL (ref 150–400)
RBC: 4.76 MIL/uL (ref 3.87–5.11)
RDW: 12.4 % (ref 11.5–15.5)
WBC: 11.3 10*3/uL — ABNORMAL HIGH (ref 4.0–10.5)
nRBC: 0 % (ref 0.0–0.2)

## 2020-07-09 LAB — BASIC METABOLIC PANEL
Anion gap: 9 (ref 5–15)
BUN: 11 mg/dL (ref 6–20)
CO2: 27 mmol/L (ref 22–32)
Calcium: 9.1 mg/dL (ref 8.9–10.3)
Chloride: 101 mmol/L (ref 98–111)
Creatinine, Ser: 0.7 mg/dL (ref 0.44–1.00)
GFR, Estimated: >60 mL/min (ref >60)
Glucose, Bld: 185 mg/dL — ABNORMAL HIGH (ref 70–99)
Potassium: 3.1 mmol/L — ABNORMAL LOW (ref 3.5–5.1)
Sodium: 137 mmol/L (ref 135–145)

## 2020-07-09 LAB — GLUCOSE, CAPILLARY
Glucose-Capillary: 210 mg/dL — ABNORMAL HIGH (ref 70–99)
Glucose-Capillary: 185 mg/dL — ABNORMAL HIGH (ref 70–99)

## 2020-07-09 MED ORDER — INSULIN GLARGINE 100 UNIT/ML ~~LOC~~ SOLN
10.0000 [IU] | Freq: Every day | SUBCUTANEOUS | Status: DC
  Administered 2020-07-09: 10 [IU] via SUBCUTANEOUS
  Filled 2020-07-09: qty 0.1

## 2020-07-09 MED ORDER — SITAGLIPTIN PHOSPHATE 25 MG PO TABS
25.0000 mg | ORAL_TABLET | Freq: Every day | ORAL | 0 refills | Status: DC
Start: 2020-07-09 — End: 2020-08-04

## 2020-07-09 MED ORDER — LINAGLIPTIN 5 MG PO TABS
5.0000 mg | ORAL_TABLET | Freq: Every day | ORAL | Status: DC
  Administered 2020-07-09: 5 mg via ORAL
  Filled 2020-07-09: qty 1

## 2020-07-09 MED ORDER — BLOOD GLUCOSE MONITOR KIT: PACK | 0 refills | Status: AC

## 2020-07-09 MED ORDER — NYSTATIN 100000 UNIT/ML MT SUSP: 5.0000 mL | Freq: Four times a day (QID) | OROMUCOSAL | 0 refills | Status: AC

## 2020-07-09 MED ORDER — BASAGLAR KWIKPEN 100 UNIT/ML ~~LOC~~ SOPN: 10.0000 [IU] | PEN_INJECTOR | Freq: Every day | SUBCUTANEOUS | 0 refills | Status: DC

## 2020-07-09 MED ORDER — APIXABAN (ELIQUIS) VTE STARTER PACK (10MG AND 5MG): ORAL_TABLET | ORAL | 0 refills | Status: DC

## 2020-07-09 MED ORDER — INSULIN GLARGINE 100 UNIT/ML SOLOSTAR PEN: 10.0000 [IU] | PEN_INJECTOR | Freq: Every day | SUBCUTANEOUS | 0 refills | Status: DC

## 2020-07-09 MED ORDER — NYSTATIN 100000 UNIT/ML MT SUSP
5.0000 mL | Freq: Four times a day (QID) | OROMUCOSAL | Status: DC
  Administered 2020-07-09: 500000 [IU] via ORAL
  Filled 2020-07-09: qty 5

## 2020-07-09 MED FILL — JANUVIA 25 MG TABLET: 25 | 30 days supply | Qty: 30 | Fill #0

## 2020-07-09 MED FILL — BASAGLAR 100 UNIT/ML KWIKPE: 100 | 30 days supply | Qty: 3 | Fill #0

## 2020-07-09 MED FILL — ELIQUIS STARTER PACK 5 MG T: 5 | 30 days supply | Qty: 74 | Fill #0

## 2020-07-09 MED FILL — BD PEN NDL NANO 32GX5/32: 32G X 4 MM | 30 days supply | Qty: 100 | Fill #0

## 2020-07-09 NOTE — Care Management (Addendum)
Eliquis tablet 5 mg   PO BID X 30 days   Benefit check entered. Will provide co pay card.   If [facility] pharmacy used for first 30 day supply they will use 30 day free card.  Medications filled in [facility]. Patient received 30 days of Eliquis.  Per benefit check , patient's insurance does not cover Eliquis but does cover Xarelto and wants patient transitioned to Xarelto.  Dr Benjamine Mola aware and sent message to PCP.   NCM provided 30 day free card and co pay card for Xarelto  to patient and explained above. Patient voiced understanding.  Ronny Flurry RN

## 2020-07-09 NOTE — Discharge Summary (Signed)
Physician Discharge Summary  Bailey Greene ATF:[id number] DOB: [birth year] DOA: 07/07/2020  PCP: [name], NP  Admit date: 07/07/2020 Discharge date: 07/09/2020  Admitted From: home Discharge disposition: home   Recommendations for Outpatient Follow-Up:   Started on nystatin swish and swallow for thrush-- if not improved in next 7-10 days may need diflucan Uncontrolled DM needs adjustment of insulin Will need after 30 days to be transitioned over to xarelto (can use free coupon for this-- found out coverage is better on her insurance after discharge and filling of eliquis)---- being treated for small PE BMP at next visit to check electrolytes  -check TSH and free t4 at next office visit  Discharge Diagnosis:   Principal Problem:   Pulmonary embolism ([place]) Active Problems:   Essential hypertension   Coronary atherosclerosis   Tobacco abuse   Hyperlipidemia with target LDL less than 70   Diabetes mellitus type 2 in nonobese ([facility])   Bipolar affective disorder ([place])    Discharge Condition: Improved.  Diet recommendation: Low sodium, heart healthy.  Carbohydrate-modified.   Wound care: None.  Code status: Full.   History of Present Illness:   Bailey Greene is a 60 y.o. female with medical history significant of HTN; HLD: DM; bipolar d/o; and CAD s/p CABG presenting with chest pain.  She had n/v and anorexia Monday-Tuesday last week, still with anorexia and chest discomfort the rest of the week and until yesterday.  This prompted her to come to the ER.   She had been SOB, worse with exertion.  No URI symptoms.  No known COVID contacts.  No LE edema.  N/V/D has resolved.  She thought she might have food poisoning.  No one else in the house has this.   Hospital Course by Problem:   PE -Patient without prior episodes of thromboembolic disease presenting with new PE -Small clot burden, comfortable on room air -started on eliquis in ER -given free month  upon d/c -after DC found that insurance covers xarelto better so she can be switched as an outpatient in 30 days (can use coupon for free month of xarelto)  DM- type 2-- HgbA!c: 13.8 -Patient's husband is diabetic and they both report that she has never been told she was diabetic -She had an A1c of 7.8 in 07/2017 -recommendations from diabetic coordinator:  insulin pen plus januvia plus meter -patient to bring blood sugar log to PCP for adjustments  Thrush -nystatin- swish and swallow for 2 weeks-- patient to contact PCP if not improved after 7 days  N/V/D -Resolved but present last week -Possibly related to food poisoning - but also could be associated with uncontrolled DM -She was less mobile and this may have led to clot -She has continued anorexia -PO intake encouraged  HTN -Continue Toprol XL  HLD -Substitute Lipitor 40 mg daily for Zocor 80 mg as per formulary recommendations  Bipolar d/o -Continue Xanax, Buspar, Depakote, Paxil, Seroquel  CAD -s/p CABG -Continue ASA  Tobacco dependence -Encourage cessation.   -This was discussed with the patient and should be reviewed on an ongoing basis.       Medical Consultants:      Discharge Exam:   Vitals:   07/09/20 0131 07/09/20 0451  BP: 118/79 123/89  Pulse: 69 72  Resp: 18 18  Temp: 98.5 F (36.9 C) 98.6 F (37 C)  SpO2: 96% 98%   Vitals:   07/08/20 1555 07/08/20 1732 07/09/20 0131 07/09/20 0451  BP: 115/74 116/86 118/79  123/89  Pulse: 72 75 69 72  Resp: '17 18 18 18  ' Temp: 98.3 F (36.8 C) 98.3 F (36.8 C) 98.5 F (36.9 C) 98.6 F (37 C)  TempSrc: Oral Oral Oral Oral  SpO2: 99% 98% 96% 98%  Weight:      Height:        General exam: Appears calm and comfortable.     The results of significant diagnostics from this hospitalization (including imaging, microbiology, ancillary and laboratory) are listed below for reference.     Procedures and Diagnostic Studies:   DG Chest 2  View  Result Date: 07/07/2020 CLINICAL DATA:  Chest pain with cough and shortness of breath EXAM: CHEST - 2 VIEW COMPARISON:  July 18, 2017 FINDINGS: Lungs are clear. Heart size and pulmonary vascularity are normal. Patient is status post coronary artery bypass grafting. There is aortic atherosclerosis. No adenopathy. No pneumothorax. No appreciable bone lesions. IMPRESSION: Lungs clear. Heart size normal. Status post coronary artery bypass grafting. Aortic Atherosclerosis (ICD10-I70.0). Electronically Signed   By: Lowella Grip III M.D.   On: 07/07/2020 10:50   CT Angio Chest PE W and/or Wo Contrast  Result Date: 07/08/2020 CLINICAL DATA:  Chest pain, shortness of breath for 1 week, remote coronary bypass 2006 EXAM: CT ANGIOGRAPHY CHEST WITH CONTRAST TECHNIQUE: Multidetector CT imaging of the chest was performed using the standard protocol during bolus administration of intravenous contrast. Multiplanar CT image reconstructions and MIPs were obtained to evaluate the vascular anatomy. CONTRAST:  59m OMNIPAQUE IOHEXOL 350 MG/ML SOLN COMPARISON:  04/20/2007 FINDINGS: Cardiovascular: Within a proximal left upper lobe segmental branch to the lingula, there is a small hypodense filling defect at a branch point, image 64 series 5 compatible with a small left pulmonary embolus. Very low thrombus burden. No significant central or proximal hilar large pulmonary embolus. Right pulmonary arteries appear patent. No evidence of heart strain by CTA. Aorta atherosclerotic. Negative for aneurysm or dissection. Patent 3 vessel arch anatomy. Previous coronary bypass changes noted. Normal heart size. Left ventricular wall thickening suggesting LVH. No pericardial effusion. Mediastinum/Nodes: Nonspecific mild thyroid enlargement. Trachea and central airways appear patent. Esophagus nondilated. No hiatal hernia. Negative for adenopathy. Lungs/Pleura: Minor dependent bibasilar and inferior lingula subpleural atelectasis.  Otherwise no acute airspace process, significant collapse or consolidation. No interstitial process or edema. Trachea central airways are patent. No pleural abnormality, effusion or pneumothorax. Upper Abdomen: No acute abnormality. Musculoskeletal: Remote median sternotomy. Minor midthoracic degenerative change. No acute osseous finding. Review of the MIP images confirms the above findings. IMPRESSION: Small proximal segmental left upper lobe pulmonary embolus. Very low thrombus burden. No evidence of heart strain. No significant central or proximal hilar large PE. Inferior lingula and bibasilar atelectasis Aortic Atherosclerosis (ICD10-I70.0). Electronically Signed   By: MJerilynn Mages  Shick M.D.   On: 07/08/2020 08:49     Labs:   Basic Metabolic Panel: Recent Labs  Lab 07/07/20 1038 07/09/20 0306  NA 137 137  K 3.6 3.1*  CL 95* 101  CO2 26 27  GLUCOSE 414* 185*  BUN 18 11  CREATININE 1.08* 0.70  CALCIUM 9.7 9.1   GFR Estimated Creatinine Clearance: 69.8 mL/min (by C-G formula based on SCr of 0.7 mg/dL). Liver Function Tests: No results for input(s): AST, ALT, ALKPHOS, BILITOT, PROT, ALBUMIN in the last 168 hours. No results for input(s): LIPASE, AMYLASE in the last 168 hours. No results for input(s): AMMONIA in the last 168 hours. Coagulation profile No results for input(s): INR, PROTIME in the last  168 hours.  CBC: Recent Labs  Lab 07/07/20 1038 07/09/20 0306  WBC 11.8* 11.3*  HGB 16.6* 13.8  HCT 46.8* 40.6  MCV 84.9 85.3  PLT 192 161   Cardiac Enzymes: No results for input(s): CKTOTAL, CKMB, CKMBINDEX, TROPONINI in the last 168 hours. BNP: Invalid input(s): POCBNP CBG: Recent Labs  Lab 07/08/20 1426 07/08/20 1757 07/08/20 2139 07/09/20 0604 07/09/20 1106  GLUCAP 315* 241* 292* 210* 185*   D-Dimer No results for input(s): DDIMER in the last 72 hours. Hgb A1c Recent Labs    07/08/20 1437  HGBA1C 13.8*   Lipid Profile No results for input(s): CHOL, HDL, LDLCALC,  TRIG, CHOLHDL, LDLDIRECT in the last 72 hours.   Anemia work up No results for input(s): VITAMINB12, FOLATE, FERRITIN, TIBC, IRON, RETICCTPCT in the last 72 hours. Microbiology Recent Results (from the past 240 hour(s))  Resp Panel by RT-PCR (Flu A&B, Covid) Nasopharyngeal Swab     Status: None   Collection Time: 07/08/20 11:12 AM   Specimen: Nasopharyngeal Swab; Nasopharyngeal(NP) swabs in vial transport medium  Result Value Ref Range Status   SARS Coronavirus 2 by RT PCR NEGATIVE NEGATIVE Final    Comment: (NOTE) SARS-CoV-2 target nucleic acids are NOT DETECTED.  The SARS-CoV-2 RNA is generally detectable in upper respiratory specimens during the acute phase of infection. The lowest concentration of SARS-CoV-2 viral copies this assay can detect is 138 copies/mL. A negative result does not preclude SARS-Cov-2 infection and should not be used as the sole basis for treatment or other patient management decisions. A negative result may occur with  improper specimen collection/handling, submission of specimen other than nasopharyngeal swab, presence of viral mutation(s) within the areas targeted by this assay, and inadequate number of viral copies(<138 copies/mL). A negative result must be combined with clinical observations, patient history, and epidemiological information. The expected result is Negative.  Fact Sheet for Patients:  EntrepreneurPulse.com.au  Fact Sheet for Healthcare Providers:  IncredibleEmployment.be  This test is no t yet approved or cleared by the [place] FDA and  has been authorized for detection and/or diagnosis of SARS-CoV-2 by FDA under an Emergency Use Authorization (EUA). This EUA will remain  in effect (meaning this test can be used) for the duration of the COVID-19 declaration under Section 564(b)(1) of the Act, 21 U.S.C.section 360bbb-3(b)(1), unless the authorization is terminated  or revoked sooner.        Influenza A by PCR NEGATIVE NEGATIVE Final   Influenza B by PCR NEGATIVE NEGATIVE Final    Comment: (NOTE) The Xpert Xpress SARS-CoV-2/FLU/RSV plus assay is intended as an aid in the diagnosis of influenza from Nasopharyngeal swab specimens and should not be used as a sole basis for treatment. Nasal washings and aspirates are unacceptable for Xpert Xpress SARS-CoV-2/FLU/RSV testing.  Fact Sheet for Patients: EntrepreneurPulse.com.au  Fact Sheet for Healthcare Providers: IncredibleEmployment.be  This test is not yet approved or cleared by the [place] FDA and has been authorized for detection and/or diagnosis of SARS-CoV-2 by FDA under an Emergency Use Authorization (EUA). This EUA will remain in effect (meaning this test can be used) for the duration of the COVID-19 declaration under Section 564(b)(1) of the Act, 21 U.S.C. section 360bbb-3(b)(1), unless the authorization is terminated or revoked.  Performed at [facility], [place] [address] [place], [place] [address]      Discharge Instructions:   Discharge Instructions    Diet Carb Modified   Complete by: As directed    Increase activity  slowly   Complete by: As directed      Allergies as of 07/09/2020   No Known Allergies     Medication List    STOP taking these medications   aspirin 81 MG chewable tablet   glucose blood test strip Commonly known as: Accu-Chek Guide     TAKE these medications   ALPRAZolam 0.5 MG tablet Commonly known as: Xanax 2 tabs for anxiety bid What changed:   how much to take  how to take this  when to take this  additional instructions   Apixaban Starter Pack (460m and 558m Commonly known as: ELIQUIS STARTER PACK Take as directed on package: start with two-60m68mablets twice daily for 7 days. On day 8, switch to one-60mg59mblet twice daily.   Basaglar KwikPen 100 UNIT/ML Inject 10 Units into the skin daily.   blood glucose  meter kit and supplies Kit Dispense based on patient and insurance preference. Use up to four times daily as directed. (FOR ICD-9 250.00, 250.01).   busPIRone 30 MG tablet Commonly known as: BUSPAR TAKE 1 TABLET BY MOUTH 2 TIMES DAILY. What changed: when to take this   divalproex 500 MG 24 hr tablet Commonly known as: DEPAKOTE ER Take 3 tablets (1,500 mg total) by mouth at bedtime.   metoprolol succinate 25 MG 24 hr tablet Commonly known as: TOPROL-XL TAKE 1 TABLET BY MOUTH EVERY DAY   nystatin 100000 UNIT/ML suspension Commonly known as: MYCOSTATIN Take 5 mLs (500,000 Units total) by mouth 4 (four) times daily for 13 days. Swish and swallow   PARoxetine 20 MG tablet Commonly known as: PAXIL TAKE 2 TABLETS BY MOUTH EVERY DAY   QUEtiapine 25 MG tablet Commonly known as: SEROQUEL TAKE 1-2 TABLETS (25-50 MG TOTAL) BY MOUTH AT BEDTIME. What changed: how much to take   simvastatin 80 MG tablet Commonly known as: ZOCOR TAKE 1 TABLET BY MOUTH EVERYDAY AT BEDTIME What changed:   how much to take  how to take this  when to take this  additional instructions   sitaGLIPtin 25 MG tablet Commonly known as: Januvia Take 1 tablet (25 mg total) by mouth daily.   Xiidra 5 % Soln Generic drug: Lifitegrast Place 1 drop into both eyes in the morning and at bedtime.       Follow-up Information    O'SuDebbrah Alar Follow up in 1 week(s).   Specialty: Internal Medicine Contact information: [phone]        HochMinus Breeding .   Specialty: Cardiology Contact information: [id number] [phone]            Time coordinating discharge: 25 min  Signed:  JessGeradine Girt [facility] 07/09/2020, 1:37 PM

## 2020-07-09 NOTE — TOC Benefit Eligibility Note (Signed)
Transition of Care [facility]) Benefit Eligibility Note    Patient Details  Name: Bailey Greene MRN: [id number] Date of Birth: 1960-11-30   Medication/Dose: Lesia Hausen 20 mg. daily for 30 days  Covered?: Yes  Tier:  (?)  Prescription Coverage [facility]  Spoke with Person/Company/Phone Number:: Charolett Bumpers W/[facility] [employer] PH# [phone]  Co-Pay: Zero  Prior Approval: No  Deductible:  (No Deductible)       Renie Ora Phone Number: 07/09/2020, 12:57 PM

## 2020-07-10 ENCOUNTER — Telehealth: Payer: Self-pay

## 2020-07-10 ENCOUNTER — Telehealth: Payer: Self-pay | Admitting: Cardiology

## 2020-07-10 NOTE — Telephone Encounter (Signed)
[facility] received paperwork from [employer] of [place] on 2/10, paperwork was put in providers box.

## 2020-07-10 NOTE — Telephone Encounter (Signed)
Transition Care Management Unsuccessful Follow-up Telephone Call  Date of discharge and from where:  07/09/20-[place]  Attempts:  1st Attempt  Reason for unsuccessful TCM follow-up call:  Left voice message

## 2020-07-11 NOTE — Telephone Encounter (Signed)
Transition Care Management Follow-up Telephone Call Date of discharge and from where: 07/09/20-[place] How have you been since you were released from the hospital? Doing a litle better but very tired. Any questions or concerns? No  Items Reviewed: Did the pt receive and understand the discharge instructions provided? Yes  Medications obtained and verified? Yes  Other? Yes  Any new allergies since your discharge? No  Dietary orders reviewed? Yes Do you have support at home? Yes   Home Care and Equipment/Supplies: Were home health services ordered? no If so, what is the name of the agency? N/a  Has the agency set up a time to come to the patient's home? not applicable Were any new equipment or medical supplies ordered?  No What is the name of the medical supply agency? N/a Were you able to get the supplies/equipment? not applicable Do you have any questions related to the use of the equipment or supplies? N/a  Functional Questionnaire: (I = Independent and D = Dependent) ADLs: I  Bathing/Dressing- I  Meal Prep- I  Eating- I  Maintaining continence- I  Transferring/Ambulation- I  Managing Meds- I  Follow up appointments reviewed:  PCP Hospital f/u appt confirmed? Yes  Scheduled to see Sandford Craze on 07/18/20 @ 1:20. Specialist Hospital f/u appt confirmed? Yes  Scheduled to see Cardiology on 07/14/20 @ time unknown. Are transportation arrangements needed? No  If their condition worsens, is the pt aware to call PCP or go to the Emergency Dept.? Yes Was the patient provided with contact information for the PCP's office or ED? Yes Was to pt encouraged to call back with questions or concerns? Yes

## 2020-07-13 NOTE — Progress Notes (Unsigned)
Cardiology Office Note   Date:  07/14/2020   ID:  Bailey Greene, DOB 1960-06-29, MRN [id number]  PCP:  [name], NP  Cardiologist:   Minus Breeding, MD   Chief Complaint  Patient presents with  . Coronary Artery Disease      History of Present Illness: Bailey Greene is a 60 y.o. female who presents for followup of a history of coronary artery disease status post CABG x5 in 2006 followup cath in 2007 showed patent grafts and improved LV function EF 65%. She had a normal stress test in May 2012 and again in July 2016 and again in March of 2018.   She had a low risk perfusion study in Feb 2019.   Since I last saw her she was hospitalized last week.  I reviewed this.  It started with vomiting and anorexia.  She said she was really weak and could not really get around very much.  This went on for about a week.  She was having sporadic chest discomfort and her heart was racing.  In the hospital she was managed for blood sugars that were not controlled.  She had thrush.  Her nausea and vomiting was thought to be related maybe to her diabetes with questionably food poisoning.  She was found to have small pulmonary emboli.  She was started on anticoagulation eventually using Xarelto.  Aspirin was stopped.  I did review and she had no acute cardiac issues.  There were no acute EKG changes.  Her troponin was minimally elevated and stable.  She is very tearful in the office today.  She says she is never felt this bad.  She lost about 10 pounds during all of this.  She is not now describing chest pressure, neck or arm discomfort.  Is not having any PND or orthopnea.     Lab Results  Component Value Date   TSH 0.254 (L) 07/08/2020      Past Medical History:  Diagnosis Date  . Anxiety   . Bipolar affective disorder ([place])   . CAD (coronary artery disease) 2006   CABG w/ LIMA-LAD, RIMA-Diag, SVG-OM1-OM2, R radial-PDA  . 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   Qualifier: Diagnosis of   By: Garen Grams    . Diabetes mellitus type 2 in nonobese ([place]) 07/2016  . Dyslipidemia   . Headache(784.0)   . HTN (hypertension)   . Migraine     Past Surgical History:  Procedure Laterality Date  . BUNIONECTOMY  08/2011   right foot  . CARDIAC CATHETERIZATION  2007   severe native 3 v dz, all grafts patent (LIMA-LAD, RIMA-Diag, SVG-OM1-OM2, R radial-PDA)  . CESAREAN SECTION  1984  . CORONARY ARTERY BYPASS GRAFT  2006    Coronary artery bypass grafting x5 with a left  internal  mammary to the left anterior descending coronary artery.  Free right  internal mammary to the diagonal coronary artery, sequential reverse  saphenous vein graft to the first and second obtuse marginal, right  artery bypass to the posterior descending coronary artery with endo-vein harvesting.     Current Outpatient Medications  Medication Sig Dispense Refill  . ALPRAZolam (XANAX) 0.5 MG tablet 2 tabs for anxiety bid (Patient taking differently: Take 1 mg by mouth 2 (two) times daily.) 120 tablet 3  . APIXABAN (ELIQUIS) VTE STARTER PACK (10MG AND 5MG) Take as directed on package: start with two-11m tablets twice daily for 7 days. On day 8, switch to one-586mtablet twice daily. 1 each 0  .  blood glucose meter kit and supplies KIT Dispense based on patient and insurance preference. Use up to four times daily as directed. (FOR ICD-9 250.00, 250.01). 1 each 0  . busPIRone (BUSPAR) 30 MG tablet TAKE 1 TABLET BY MOUTH 2 TIMES DAILY. (Patient taking differently: Take 30 mg by mouth 2 times daily at 12 noon and 4 pm.) 180 tablet 0  . divalproex (DEPAKOTE ER) 500 MG 24 hr tablet Take 3 tablets (1,500 mg total) by mouth at bedtime. 270 tablet 0  . Insulin Glargine (BASAGLAR KWIKPEN) 100 UNIT/ML Inject 10 Units into the skin daily. 9 mL 0  . metoprolol succinate (TOPROL-XL) 25 MG 24 hr tablet TAKE 1 TABLET BY MOUTH EVERY DAY (Patient taking differently: Take 25 mg by mouth daily.) 90 tablet 3  . nystatin (MYCOSTATIN)  100000 UNIT/ML suspension Take 5 mLs (500,000 Units total) by mouth 4 (four) times daily for 13 days. Swish and swallow 473 mL 0  . PARoxetine (PAXIL) 20 MG tablet TAKE 2 TABLETS BY MOUTH EVERY DAY (Patient taking differently: Take 40 mg by mouth daily.) 180 tablet 0  . QUEtiapine (SEROQUEL) 25 MG tablet TAKE 1-2 TABLETS (25-50 MG TOTAL) BY MOUTH AT BEDTIME. (Patient taking differently: Take 25 mg by mouth at bedtime.) 180 tablet 0  . simvastatin (ZOCOR) 80 MG tablet TAKE 1 TABLET BY MOUTH EVERYDAY AT BEDTIME (Patient taking differently: Take 80 mg by mouth at bedtime.) 90 tablet 3  . sitaGLIPtin (JANUVIA) 25 MG tablet Take 1 tablet (25 mg total) by mouth daily. 30 tablet 0  . XIIDRA 5 % SOLN Place 1 drop into both eyes in the morning and at bedtime.     No current facility-administered medications for this visit.    Allergies:   Patient has no known allergies.    ROS:  Please see the history of present illness.   Otherwise, review of systems are positive for none.   All other systems are reviewed and negative.    PHYSICAL EXAM: VS:  BP 106/80   Pulse 78   Ht '5\' 4"'  (1.626 m)   Wt 133 lb (60.3 kg)   LMP 11/28/2009   SpO2 96%   BMI 22.83 kg/m  , BMI Body mass index is 22.83 kg/m. GENERAL:  Well appearing NECK:  No jugular venous distention, waveform within normal limits, carotid upstroke brisk and symmetric, no bruits, no thyromegaly LUNGS:  Clear to auscultation bilaterally CHEST:  Well healed sternotomy scar. HEART:  PMI not displaced or sustained,S1 and S2 within normal limits, no S3, no S4, no clicks, no rubs, no murmurs ABD:  Flat, positive bowel sounds normal in frequency in pitch, no bruits, no rebound, no guarding, no midline pulsatile mass, no hepatomegaly, no splenomegaly EXT:  2 plus pulses throughout, no edema, no cyanosis no clubbing   EKG:  EKG is  ordered today. The ekg ordered today demonstrates sinus rhythm, rate 78, axis within normal limits, intervals within  normal limits, no acute ST-T wave changes.   Recent Labs: 07/08/2020: TSH 0.254 07/09/2020: BUN 11; Creatinine, Ser 0.70; Hemoglobin 13.8; Platelets 161; Potassium 3.1; Sodium 137    Lipid Panel    Component Value Date/Time   CHOL 157 08/17/2016 1225   TRIG 114.0 08/17/2016 1225   HDL 60.60 08/17/2016 1225   CHOLHDL 3 08/17/2016 1225   VLDL 22.8 08/17/2016 1225   LDLCALC 74 08/17/2016 1225   LDLDIRECT 145.2 07/18/2008 0821      Wt Readings from Last 3 Encounters:  07/14/20 133  lb (60.3 kg)  07/08/20 130 lb (59 kg)  04/04/20 150 lb 12.8 oz (68.4 kg)      Other studies Reviewed: Additional studies/ records that were reviewed today include: Hospital records. Review of the above records demonstrates:    Please see elsewhere in the note.     ASSESSMENT AND PLAN:  CAD:   She essentially had negative enzymes during her recent hospitalization and I am not suspecting any acute coronary syndrome.  I am not planning further ischemia work-up.  She will continue risk reduction.  DYSLIPIDEMIA:    She is on high-dose statin.  I do not I do not see a recent lipid profile and I will ask her to come back for fasting lipid profile.  I actually do not see liver enzymes drawn during her hospitalization and I will order these as well  ABNORMAL THYROID STUDY:  I do note during the hospitalization that her TSH was low but I do not see that this was addressed.  I have sent a message to [name], NP to ask if she would kindly follow-up on this and manage accordingly.  Also the patient seems to have a slightly enlarged thyroid on my exam.  HTN:    For blood pressure is at target.  No change in therapy.   DM:   A1C was 13.8.  I will defer management to her primary care provider although I would suggest if they believe it appropriate to start her on SGLT2 inhibitor for the cardiac benefits.     PE: I think this was a secondary PE and she can complete 3 months of therapy and I do not think she  would need to be on extended DOAC.    TOBACCO: We again talked about the need to stop smoking.   Current medicines are reviewed at length with the patient today.  The patient does not have concerns regarding medicines.  The following changes have been made:  None  Labs/ tests ordered today include:   Orders Placed This Encounter  Procedures  . Lipid panel  . Hepatic function panel  . EKG 12-Lead     Disposition:   FU with me in 3 months.     Signed, Minus Breeding, MD  07/14/2020 1:24 PM    [facility]

## 2020-07-14 ENCOUNTER — Encounter: Payer: Self-pay | Admitting: Cardiology

## 2020-07-14 ENCOUNTER — Ambulatory Visit: Payer: 59 | Admitting: Cardiology

## 2020-07-14 ENCOUNTER — Other Ambulatory Visit: Payer: Self-pay

## 2020-07-14 VITALS — BP 106/80 | HR 78 | Ht 64.0 in | Wt 133.0 lb

## 2020-07-14 DIAGNOSIS — I10 Essential (primary) hypertension: Secondary | ICD-10-CM

## 2020-07-14 DIAGNOSIS — I2510 Atherosclerotic heart disease of native coronary artery without angina pectoris: Secondary | ICD-10-CM | POA: Diagnosis not present

## 2020-07-14 DIAGNOSIS — E785 Hyperlipidemia, unspecified: Secondary | ICD-10-CM | POA: Diagnosis not present

## 2020-07-14 DIAGNOSIS — E118 Type 2 diabetes mellitus with unspecified complications: Secondary | ICD-10-CM | POA: Diagnosis not present

## 2020-07-14 NOTE — Patient Instructions (Addendum)
Medication Instructions:  No changes  Labs: Your physician recommends that you return for lab work FASTING: Lipids / Liver  Follow-Up: At [facility], you and your health needs are our priority.  As part of our continuing mission to provide you with exceptional heart care, we have created designated Provider Care Teams.  These Care Teams include your primary Cardiologist (physician) and Advanced Practice Providers (APPs -  Physician Assistants and Nurse Practitioners) who all work together to provide you with the care you need, when you need it.   Your next appointment:   3 month(s)  The format for your next appointment:   In Person  Provider:   Judy Pimple, PA  Other Instructions Office will contact you once paperwork has been completed.

## 2020-07-15 NOTE — Telephone Encounter (Signed)
Pharmacy Transitions of Care Follow-up Telephone Call  Date of discharge: 07/09/20  Discharge Diagnosis: chest pain   How have you been since you were released from the hospital? Patient has been doing well. Felling much better than before she went to the hospital. Knows s/sx of bleeding to watch for   Medication changes made at discharge: yes  Medication changes obtained and verified? yes    Medication Accessibility:  Home Pharmacy: [facility] [address] [place]  Was the patient provided with refills on discharged medications? Only basaglar  Have all prescriptions been transferred from [facility] to home pharmacy? yes  . Is the patient able to afford medications? yes    Medication Review:  APIXABEN (ELIQUIS)  Apixaban 10 mg BID initiated on 07/09/20. Will switch to apixaban 5 mg BID after 7 days.  - Discussed importance of taking medication around the same time everyday  - Advised patient of medications to avoid (NSAIDs, ASA)  - Educated that Tylenol (acetaminophen) will be the preferred analgesic to prevent risk of bleeding  - Emphasized importance of monitoring for signs and symptoms of bleeding (abnormal bruising, prolonged bleeding, nose bleeds, bleeding from gums, discolored urine, black tarry stools)  - Advised patient to alert all providers of anticoagulation therapy prior to starting a new medication or having a procedure   Follow-up Appointments:  Saw Cardiologist yesterday (07/14/20) and has follow up with [facility] on 07/18/20 with Sandford Craze. Knows to get refill for maintenance Eliquis dose at this visit.  If their condition worsens, is the pt aware to call PCP or go to the Emergency Dept.? yes  Final Patient Assessment: Patient doing well. Has f/u scheduled and knows to get refills at that visit.

## 2020-07-16 ENCOUNTER — Other Ambulatory Visit: Payer: Self-pay | Admitting: Psychiatry

## 2020-07-16 DIAGNOSIS — F4001 Agoraphobia with panic disorder: Secondary | ICD-10-CM

## 2020-07-17 DIAGNOSIS — Z0279 Encounter for issue of other medical certificate: Secondary | ICD-10-CM

## 2020-07-17 LAB — HEPATIC FUNCTION PANEL
ALT: 14 IU/L (ref 0–32)
AST: 24 IU/L (ref 0–40)
Albumin: 3.6 g/dL — ABNORMAL LOW (ref 3.8–4.9)
Alkaline Phosphatase: 110 IU/L (ref 44–121)
Bilirubin Total: 0.3 mg/dL (ref 0.0–1.2)
Bilirubin, Direct: <0.1 mg/dL (ref 0.00–0.40)
Total Protein: 5.8 g/dL — ABNORMAL LOW (ref 6.0–8.5)

## 2020-07-17 LAB — LIPID PANEL
Chol/HDL Ratio: 2.4 ratio (ref 0.0–4.4)
Cholesterol, Total: 139 mg/dL (ref 100–199)
HDL: 58 mg/dL (ref >39)
LDL Chol Calc (NIH): 65 mg/dL (ref 0–99)
Triglycerides: 80 mg/dL (ref 0–149)
VLDL Cholesterol Cal: 16 mg/dL (ref 5–40)

## 2020-07-18 ENCOUNTER — Ambulatory Visit: Payer: 59 | Admitting: Family

## 2020-07-18 ENCOUNTER — Telehealth: Payer: Self-pay | Admitting: Family

## 2020-07-18 ENCOUNTER — Other Ambulatory Visit: Payer: Self-pay

## 2020-07-18 ENCOUNTER — Encounter: Payer: Self-pay | Admitting: Family

## 2020-07-18 VITALS — BP 116/75 | HR 77 | Temp 98.7°F | Resp 16 | Ht 64.0 in | Wt 140.6 lb

## 2020-07-18 DIAGNOSIS — Z23 Encounter for immunization: Secondary | ICD-10-CM

## 2020-07-18 DIAGNOSIS — E785 Hyperlipidemia, unspecified: Secondary | ICD-10-CM | POA: Diagnosis not present

## 2020-07-18 DIAGNOSIS — I10 Essential (primary) hypertension: Secondary | ICD-10-CM

## 2020-07-18 DIAGNOSIS — E119 Type 2 diabetes mellitus without complications: Secondary | ICD-10-CM

## 2020-07-18 DIAGNOSIS — L84 Corns and callosities: Secondary | ICD-10-CM | POA: Diagnosis not present

## 2020-07-18 DIAGNOSIS — I2510 Atherosclerotic heart disease of native coronary artery without angina pectoris: Secondary | ICD-10-CM

## 2020-07-18 DIAGNOSIS — Z720 Tobacco use: Secondary | ICD-10-CM

## 2020-07-18 MED ORDER — RIVAROXABAN 20 MG PO TABS: 20.0000 mg | ORAL_TABLET | Freq: Every day | ORAL | 1 refills | Status: DC

## 2020-07-18 NOTE — Telephone Encounter (Signed)
Please call Dr. Ashley Royalty office and request a copy of her eye exam.

## 2020-07-18 NOTE — Telephone Encounter (Signed)
Also, please initiate cologuard screening.

## 2020-07-18 NOTE — Telephone Encounter (Signed)
Order for cologuard faxed to [facility] with insurance and demographics.  Records release faxed to Dr. Ashley Royalty office.

## 2020-07-18 NOTE — Progress Notes (Signed)
Subjective:    Patient ID: Bailey Greene, female    DOB: 04-02-1961, 60 y.o.   MRN: [id number]  HPI  Patient is a 60 yr old female who presents today for hospital follow up. She is also here to re-establish care as her last visit with me was back in 2018.   She was admitted 07/07/2020 with pulmonary embolism. She was discharged on Eliquis with plans to switch to xarelto in 30 days due to insurance purposes.  She reports that sob "comes and goes" as well as the chest pain that she experienced that brough her to the hospital.   DM2- She is currently on basaglar 10 units and Januvia 36m  Lab Results  Component Value Date   HGBA1C 13.8 (H) 07/08/2020   HGBA1C 7.8 (H) 07/18/2017   HGBA1C 7.6 08/13/2016   Lab Results  Component Value Date   LDLCALC 65 07/17/2020   CREATININE 0.70 07/09/2020   Oral thrush- was treated with nystatin.  Reports that this is improved.   HTN- maintained on metoprolol.   BP Readings from Last 3 Encounters:  07/18/20 116/75  07/14/20 106/80  07/09/20 123/89   Hyperlipidemia- was on zocor 80. Lab Results  Component Value Date   CHOL 139 07/17/2020   HDL 58 07/17/2020   LDLCALC 65 07/17/2020   LDLDIRECT 145.2 07/18/2008   TRIG 80 07/17/2020   CHOLHDL 2.4 07/17/2020   Bipolar disorder- Reports mood has been stable. Sees Dr. CClovis Pu   Tobacco abuse- working on quitting.    CAD- following with Dr. HPercival Spanish   Review of Systems See HPI  Past Medical History:  Diagnosis Date  . Anxiety   . Bipolar affective disorder (HBrady   . CAD (coronary artery disease) 2006   CABG w/ LIMA-LAD, RIMA-Diag, SVG-OM1-OM2, R radial-PDA  . 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   Qualifier: Diagnosis of  By: AGaren Grams   . Diabetes [place] 2 [place]obese [place] 07[place]2016  . Dyslipidemia   . Headache(784.0)   . HTN (hypertension)   . Migraine      Social History   Socioeconomic History  . Marital status: Legally Separated    Spouse name: Not on file  .  Number of children: Not on file  . Years of education: Not on file  . Highest education level: Not on file  Occupational History  . Occupation: mInsurance claims handler Tobacco Use  . Smoking status: Current Every Day Smoker    Packs/day: 0.50    Years: 25.00    Pack years: 12.50    Types: Cigarettes  . Smokeless tobacco: Never Used  Vaping Use  . Vaping Use: Never used  Substance and Sexual Activity  . Alcohol use: No    Alcohol/week: 0.0 standard drinks  . Drug use: Never  . Sexual activity: Yes    Partners: Male    Comment: married  Other Topics Concern  . Not on file  Social History Narrative   Regular exercise:  3 days weekly   Caffeine Use:  1 soda daily   One child biological daughter born in 162and an adopted niece.   BEllisspecialist   Married- may be divorcing.          Social Determinants of Health   Financial Resource Strain: Not on file  Food Insecurity: Not on file  Transportation Needs: Not on file  Physical Activity: Not on file  Stress: Not on file  Social Connections: Not on file  Intimate  Partner Violence: Not on file    Past Surgical History:  Procedure Laterality Date  . BUNIONECTOMY  08/2011   right foot  . CARDIAC CATHETERIZATION  2007   severe native 3 v dz, all grafts patent (LIMA-LAD, RIMA-Diag, SVG-OM1-OM2, R radial-PDA)  . CESAREAN SECTION  1984  . CORONARY ARTERY BYPASS GRAFT  2006    Coronary artery bypass grafting x5 with a left  internal  mammary to the left anterior descending coronary artery.  Free right  internal mammary to the diagonal coronary artery, sequential reverse  saphenous vein graft to the first and second obtuse marginal, right  artery bypass to the posterior descending coronary artery with endo-vein harvesting.    Family History  Problem Relation Age of Onset  . Lupus Mother   . Heart attack Father   . Diabetes Father   . Hypertension Father   . Diabetes Paternal Grandmother   .  Hypertension Paternal Grandmother   . Stroke Neg Hx   . Kidney disease Neg Hx   . Hyperlipidemia Neg Hx   . Sudden death Neg Hx     No Known Allergies  Current Outpatient Medications on File Prior to Visit  Medication Sig Dispense Refill  . ALPRAZolam (XANAX) 0.5 MG tablet TAKE 2 TABLETS BY MOUTH TWICE A DAY AS NEEDED FOR ANXIETY 60 tablet 1  . APIXABAN (ELIQUIS) VTE STARTER PACK (10MG AND 5MG) Take as directed on package: start with two-29m tablets twice daily for 7 days. On day 8, switch to one-517mtablet twice daily. 1 each 0  . blood glucose meter kit and supplies KIT Dispense based on patient and insurance preference. Use up to four times daily as directed. (FOR ICD-9 250.00, 250.01). 1 each 0  . busPIRone (BUSPAR) 30 MG tablet TAKE 1 TABLET BY MOUTH 2 TIMES DAILY. (Patient taking differently: Take 30 mg by mouth 2 times daily at 12 noon and 4 pm.) 180 tablet 0  . divalproex (DEPAKOTE ER) 500 MG 24 hr tablet Take 3 tablets (1,500 mg total) by mouth at bedtime. 270 tablet 0  . Insulin Glargine (BASAGLAR KWIKPEN) 100 UNIT/ML Inject 10 Units into the skin daily. 9 mL 0  . metoprolol succinate (TOPROL-XL) 25 MG 24 hr tablet TAKE 1 TABLET BY MOUTH EVERY DAY (Patient taking differently: Take 25 mg by mouth daily.) 90 tablet 3  . nystatin (MYCOSTATIN) 100000 UNIT/ML suspension Take 5 mLs (500,000 Units total) by mouth 4 (four) times daily for 13 days. Swish and swallow 473 mL 0  . PARoxetine (PAXIL) 20 MG tablet TAKE 2 TABLETS BY MOUTH EVERY DAY (Patient taking differently: Take 40 mg by mouth daily.) 180 tablet 0  . QUEtiapine (SEROQUEL) 25 MG tablet TAKE 1-2 TABLETS (25-50 MG TOTAL) BY MOUTH AT BEDTIME. (Patient taking differently: Take 25 mg by mouth at bedtime.) 180 tablet 0  . simvastatin (ZOCOR) 80 MG tablet TAKE 1 TABLET BY MOUTH EVERYDAY AT BEDTIME (Patient taking differently: Take 80 mg by mouth at bedtime.) 90 tablet 3  . sitaGLIPtin (JANUVIA) 25 MG tablet Take 1 tablet (25 mg total) by  mouth daily. 30 tablet 0  . XIIDRA 5 % SOLN Place 1 drop into both eyes in the morning and at bedtime.     No current facility-administered medications on file prior to visit.    BP 116/75 (BP Location: Left Arm, Patient Position: Sitting, Cuff Size: Small)   Pulse 77   Temp 98.7 F (37.1 C) (Oral)   Resp 16   Ht  '5\' 4"'  (1.626 m)   Wt 140 lb 9.6 oz (63.8 kg)   LMP 11/28/2009   SpO2 100%   BMI 24.13 kg/m       Objective:   Physical Exam Constitutional:      Appearance: She is well-developed and well-nourished.  Cardiovascular:     Rate and Rhythm: Normal rate and regular rhythm.     Heart sounds: Normal heart sounds. No murmur heard.   Pulmonary:     Effort: Pulmonary effort is normal. No respiratory distress.     Breath sounds: Normal breath sounds. No wheezing.  Psychiatric:        Mood and Affect: Mood and affect normal.        Behavior: Behavior normal.        Thought Content: Thought content normal.        Judgment: Judgment normal.           Assessment & Plan:  DM2- uncontrolled. Advised pt to check her sugar twice daily and send me her readings via mychart. Will adjust insulin as needed.  PE- clinically stable.  Unprovoked.  Plan anticoagulation for at least 3 months.  Pt is advised to finish the eliquis starter pack and then switch to xarelto 31m once daily.    HTN- maintained on toprol xl 264monce daily. BP stable, continue same.  Bipolar disorder- clinically stable, management per psychiatry.  Tobacco abuse- pt counseled on importance of quitting.  CAD- clinically stable. Management per cardiology.   Hyperlipidemia- LDL at goal. Continue atorvastatin 803m  This visit occurred during the SARS-CoV-2 public health emergency.  Safety protocols were in place, including screening questions prior to the visit, additional usage of staff PPE, and extensive cleaning of exam room while observing appropriate contact time as indicated for disinfecting  solutions.

## 2020-07-18 NOTE — Patient Instructions (Addendum)
Please finish the Eliquis starter pack, then switch to xarelto 20mg  once daily.  Please check your sugars twice daily and send me your readings in 1 week.  Please send me the name of your Gynecologist and we will call for your records.  You should be contacted about scheduling your appointment with the podiatrist.

## 2020-07-19 LAB — MICROALBUMIN / CREATININE URINE RATIO
Creatinine, Urine: 38 mg/dL (ref 20–275)
Microalb Creat Ratio: 11 mcg/mg creat (ref <30)
Microalb, Ur: 0.4 mg/dL

## 2020-07-21 NOTE — Telephone Encounter (Signed)
Received paperwork from provider, faxed to [employer], and contacted patient to give update on status on paperwork from [facility] heartcare side on 2/21.

## 2020-07-21 NOTE — Progress Notes (Unsigned)
Cardiology Office Note:    Date:  07/23/2020   ID:  Bailey Greene, DOB [birth year], MRN [id number]  PCP:  [name], NP  Cardiologist:  Minus Breeding, MD  Electrophysiologist:  None   Referring MD: [name], NP   Chief Complaint: chest pain  History of Present Illness:    Bailey Greene is a 60 y.o. female with a history of CAD s/p CABG x5 (LIMA-LAD, RIMA-Diag, sequential SVG-OM1-OM2, right radial-PDA) in 2006 with cardiac cath in 2008 showing patent grafts and multiple negative stress tests since then most recently in 07/2017, hypertension, dyslipidemia, type 2 diabetes mellitus, migraine, bipolar affective disorder, and anxiety who is followed by Dr. Percival Spanish and presents today for further evaluation of chest pain.   Patient has a long-standing history of sharp chest pain in the setting of emotional stress. Most recent Echo in 07/2017 showed LVEF of 55-60% with normal wall motion and grade 1 diastolic dysfunction. Most recent ischemic evaluation was a Myoview in 07/2017 which was low risk with no evidence of ischemia.   Patient was recently admitted from 07/07/2020 to 07/09/2020 for acute PE after presenting with chest pain. It started with vomiting and anorexia which caused her to feel very weak. After about a week of this, she started having sporadic chest pain and felt like her heart was racing which is when she ultimately decided to go to the ED.She was diagnosed with a small PE and started on Eliquis. Aspirin was stopped. Troponin minimally elevated and flat (peaked at 38) - not consistent with ACS. She was also diagnosed with thrush and started on Nystatin. Blood sugar was also found to be poorly controlled (she reported had never been told she was diabetic). Hemoglobin A1c 13.7. She was started on insulin and Januvia.   She was seen by Dr. Percival Spanish on 07/14/2020. She was very tearful during this visit and reported she has never felt this bad. She was down about 10  lbs. However, she denied any acute cardiac symptoms. No additional ischemic work-up was recommended.  Patient reports continued left arm and chest tightness that occurs mostly with exertion but also at rest. Last for about 15 minutes and then resolves on its own. Pain is severe sometimes 9-10/10 on the pain scare. She does admit she has been anxious and stress recently since hospitalization; however, she also states it is similar to prior angina she had before CABG. Upon further questions, she reports that the chest pain only occurs with she is having rapid palpitations. She will also have some lightheadedness and dizziness with this but no syncope. This occurs multiple times per week (4-5 times per week) and can sometimes occurs twice per day. No chest pain outside of palpitations. She continues to have some shortness of breath with activity but states it is improving with treatment of PE. No shortness of breath at rest. No orthopnea or edema. Tolerating anticoagulation well.   Past Medical History:  Diagnosis Date  . Anxiety   . Bipolar affective disorder ([place])   . CAD (coronary artery disease) 2006   CABG w/ LIMA-LAD, RIMA-Diag, SVG-OM1-OM2, R radial-PDA  . 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   Qualifier: Diagnosis of  By: Garen Grams    . Diabetes mellitus type 2 in nonobese ([place]) 07/2016  . Dyslipidemia   . Headache(784.0)   . HTN (hypertension)   . Migraine     Past Surgical History:  Procedure Laterality Date  . BUNIONECTOMY  08/2011   right foot  . CARDIAC CATHETERIZATION  2007  severe native 3 v dz, all grafts patent (LIMA-LAD, RIMA-Diag, SVG-OM1-OM2, R radial-PDA)  . CESAREAN SECTION  1984  . CORONARY ARTERY BYPASS GRAFT  2006    Coronary artery bypass grafting x5 with a left  internal  mammary to the left anterior descending coronary artery.  Free right  internal mammary to the diagonal coronary artery, sequential reverse  saphenous vein graft to the first and second obtuse marginal,  right  artery bypass to the posterior descending coronary artery with endo-vein harvesting.    Current Medications: Current Meds  Medication Sig  . ALPRAZolam (XANAX) 0.5 MG tablet TAKE 2 TABLETS BY MOUTH TWICE A DAY AS NEEDED FOR ANXIETY  . blood glucose meter kit and supplies KIT Dispense based on patient and insurance preference. Use up to four times daily as directed. (FOR ICD-9 250.00, 250.01).  . busPIRone (BUSPAR) 30 MG tablet TAKE 1 TABLET BY MOUTH 2 TIMES DAILY. (Patient taking differently: Take 30 mg by mouth 2 times daily at 12 noon and 4 pm.)  . divalproex (DEPAKOTE ER) 500 MG 24 hr tablet Take 3 tablets (1,500 mg total) by mouth at bedtime.  . Insulin Glargine (BASAGLAR KWIKPEN) 100 UNIT/ML Inject 10 Units into the skin daily.  . nitroGLYCERIN (NITROSTAT) 0.4 MG SL tablet Place 1 tablet (0.4 mg total) under the tongue every 5 (five) minutes as needed for chest pain.  Marland Kitchen PARoxetine (PAXIL) 20 MG tablet TAKE 2 TABLETS BY MOUTH EVERY DAY (Patient taking differently: Take 40 mg by mouth daily.)  . QUEtiapine (SEROQUEL) 25 MG tablet TAKE 1-2 TABLETS (25-50 MG TOTAL) BY MOUTH AT BEDTIME. (Patient taking differently: Take 25 mg by mouth at bedtime.)  . rivaroxaban (XARELTO) 20 MG TABS tablet Take 1 tablet (20 mg total) by mouth daily with supper.  . simvastatin (ZOCOR) 80 MG tablet TAKE 1 TABLET BY MOUTH EVERYDAY AT BEDTIME (Patient taking differently: Take 80 mg by mouth at bedtime.)  . sitaGLIPtin (JANUVIA) 25 MG tablet Take 1 tablet (25 mg total) by mouth daily.  Marland Kitchen XIIDRA 5 % SOLN Place 1 drop into both eyes in the morning and at bedtime.  . [DISCONTINUED] metoprolol succinate (TOPROL-XL) 25 MG 24 hr tablet TAKE 1 TABLET BY MOUTH EVERY DAY (Patient taking differently: Take 25 mg by mouth daily.)     Allergies:   Patient has no known allergies.   Social History   Socioeconomic History  . Marital status: Legally Separated    Spouse name: Not on file  . Number of children: Not on  file  . Years of education: Not on file  . Highest education level: Not on file  Occupational History  . Occupation: Insurance claims handler  Tobacco Use  . Smoking status: Current Every Day Smoker    Packs/day: 0.50    Years: 25.00    Pack years: 12.50    Types: Cigarettes  . Smokeless tobacco: Never Used  Vaping Use  . Vaping Use: Never used  Substance and Sexual Activity  . Alcohol use: No    Alcohol/week: 0.0 standard drinks  . Drug use: Never  . Sexual activity: Yes    Partners: Male    Comment: married  Other Topics Concern  . Not on file  Social History Narrative   Regular exercise:  3 days weekly   Caffeine Use:  1 soda daily   One child biological daughter born in 62 and an adopted niece.   [employer] specialist   Married- may be divorcing.  Social Determinants of Health   Financial Resource Strain: Not on file  Food Insecurity: Not on file  Transportation Needs: Not on file  Physical Activity: Not on file  Stress: Not on file  Social Connections: Not on file     Family History: The patient's family history includes Diabetes in her father and paternal grandmother; Heart attack in her father; Hypertension in her father and paternal grandmother; Lupus in her mother. There is no history of Stroke, Kidney disease, Hyperlipidemia, or Sudden death.  ROS:   Please see the history of present illness.     EKGs/Labs/Other Studies Reviewed:    The following studies were reviewed today:  Cardiac Catheterization 06/30/2005: Coronaries: - Left main was normal. - The LAD had severe diffuse proximal disease with focal 95% stenosis compromising septal perforators.  There was long mid 75% stenosis.  The LAD distal wrapped the apex.  It consisted of two small diagonals.  Thesevessels were free of disease. - Circumflex in the AV groove was normal.   - There was a small ramus intermediate (referred to as a diagonal in the operative note) that had  proximal severe diffuse disease, OM-1 and OM-2 both had proximal severe diffuse disease.  All three of these vessels were seen to fill via grafts. - The right coronary artery was a long dominant vessel.  It had proximal mid long 50% stenosis.  There was a distal long 70% stenosis before the PDA. The proximal portion of the PDA had 80% stenosis.  The distal vessel was free of high grade disease and seemed to fill via a graft.  Grafts: - LIMA to the LAD was widely patent. - Free RIMA to the PDA was patent, though somewhat narrow in caliber. - Saphenous vein graft sequential to OM-1 and OM-2 was widely patent. - A free radial appeared to be anastomosed at its proximal segment to the  origin of the saphenous vein graft. The distal anastomosis was to the ramus intermediate (diagonal).  This was a  small graft but was patent and free of high grade disease.  Left ventriculogram: The left ventriculogram was obtained in the RAO  projection.  EF was 65% with normal wall motion.  Conclusion: Severe three vessel coronary artery disease.  Patent grafts. Well preserved ejection fraction. _______________  Echocardiogram 07/19/2017: Study Conclusions: - Left ventricle: The cavity size was normal. There was mild  concentric hypertrophy. Systolic function was normal. The  estimated ejection fraction was in the range of 55% to 60%. Wall  motion was normal; there were no regional wall motion  abnormalities. Doppler parameters are consistent with abnormal  left ventricular relaxation (grade 1 diastolic dysfunction).  Doppler parameters are consistent with indeterminate ventricular  filling pressure.  - Aortic valve: There was no regurgitation.  - Mitral valve: Transvalvular velocity was within the normal range.  There was no evidence for stenosis. There was no regurgitation.  - Left atrium: The atrium was mildly dilated.  - Right ventricle: The cavity size was normal. Wall thickness was   normal. Systolic function was normal.  - Tricuspid valve: There was no regurgitation.  _______________  Myoview 07/27/2017:  Nuclear stress EF: 64%. No wall motion abnormalities  The left ventricular ejection fraction is normal (55-65%).  There was no ST segment deviation noted during stress.  Defect 1: There is a small defect of mild severity present in the apex location.  This is a low risk study. No ischemia identified   EKG:  EKG ordered today. EKG  personally reviewed and demonstrates normal sinus rhythm, rate 84 bpm, with possible left atrial enlargement but no acute ST/T changes. Normal axis. Normal PR and QRS intervals. QTc 423 ms.   Recent Labs: 07/08/2020: TSH 0.254 07/09/2020: BUN 11; Creatinine, Ser 0.70; Hemoglobin 13.8; Platelets 161; Potassium 3.1; Sodium 137 07/17/2020: ALT 14  Recent Lipid Panel    Component Value Date/Time   CHOL 139 07/17/2020 1009   TRIG 80 07/17/2020 1009   HDL 58 07/17/2020 1009   CHOLHDL 2.4 07/17/2020 1009   CHOLHDL 3 08/17/2016 1225   VLDL 22.8 08/17/2016 1225   LDLCALC 65 07/17/2020 1009   LDLDIRECT 145.2 07/18/2008 0821    Physical Exam:    Vital Signs: BP 110/74   Pulse 84   Ht '5\' 4"'  (1.626 m)   Wt 139 lb 12.8 oz (63.4 kg)   LMP 11/28/2009   BMI 24.00 kg/m     Wt Readings from Last 3 Encounters:  07/23/20 139 lb 12.8 oz (63.4 kg)  07/18/20 140 lb 9.6 oz (63.8 kg)  07/14/20 133 lb (60.3 kg)     General: 60 y.o. female in no acute distress. HEENT: Normocephalic and atraumatic. Sclera clear.  Neck: Supple. No carotid bruits. No JVD. Heart: RRR. Distinct S1 and S2. No murmurs, gallops, or rubs. Radial pulses 2+ and equal bilaterally. Lungs: No increased work of breathing. Clear to ausculation bilaterally. No wheezes, rhonchi, or rales.  Abdomen: Soft, non-distended, and non-tender to palpation.  Extremities: No lower extremity edema.    Skin: Warm and dry. Neuro: Alert and oriented x3. No focal deficits. Psych: Normal  affect. Responds appropriately.  Assessment:    1. Palpitations   2. Chest pain of uncertain etiology   3. Coronary artery disease involving native coronary artery of native heart without angina pectoris   4. S/P CABG (coronary artery bypass graft)   5. Acute pulmonary embolism without acute cor pulmonale, unspecified pulmonary embolism type ([place])   6. Primary hypertension   7. Dyslipidemia   8. Type 2 diabetes mellitus with complication, without long-term current use of insulin (HCC)     Plan:    Palpitations - Patient reports palpitaitons with associated chest pain and lightheadedness/dizziness - EKG showed normal sinus rhythm with no ectopy. - Will increase Toprol-XL to 51m daily. - Potassium was low on 07/09/2020. Will repeat BMET and Magnesium today.  - Recent TSH normal. - Will check Echo and 1 week Zio monitor.  Chest Pain History of CAD s/p CABG - History of CABG x5 in 2006 with cardiac cath in 2007 showing patent grafts. She has had multiple stress test since then which have been negative most recently in 07/2017.  - Patient reports chest pain during episodes of palpitations. However, she does state it feels similar to prior cardiac pain. - EKG shows no acute ischemic changes. - No aspirin due to need for DOAC. - Continue beta-blocker and statin. - Will get Echo and Monitor first as above given chest pain only occurs with palpitations. - Will follow-up with patient in 1 month. If monitor and Echo unremarkable and she is still having pain, will consider repeat stress test. - Will prescribe sublingual Nitro to take as needed since she does not have this at home. Advised patient to let [place] if symptoms worsen prior to next visit.   Recent PE - Diagnosed with PE on 07/08/2020. Plan is for anticoagulation for at least 3 months. - Patient is going to finish Eliquis start Pack and then switch  to Xarelto 59m daily because insurance does not cover Eliquis. - Management per  PCP.  Hypertension - BP well controlled. - Will increase Toprol as above given palpitations.  Dyslipidemia - Recent lipid panel 07/17/2020: Total Cholesterol 139, Triglycerides 80, HDL 58, LDL 65. - LDL goal <70 given CAD. - Continue Simvastatin 83mdaily.  Type 2 Diabetes Mellitus - Hemoglobin A1c of 13.8 on 07/08/2020. - Management per PCP.  Disposition: Follow up with me in 1 month.   Medication Adjustments/Labs and Tests Ordered: Current medicines are reviewed at length with the patient today.  Concerns regarding medicines are outlined above.  Orders Placed This Encounter  Procedures  . Basic Metabolic Panel (BMET)  . Magnesium  . LONG TERM MONITOR (3-14 DAYS)  . EKG 12-Lead  . ECHOCARDIOGRAM COMPLETE   Meds ordered this encounter  Medications  . nitroGLYCERIN (NITROSTAT) 0.4 MG SL tablet    Sig: Place 1 tablet (0.4 mg total) under the tongue every 5 (five) minutes as needed for chest pain.    Dispense:  25 tablet    Refill:  3  . metoprolol succinate (TOPROL-XL) 50 MG 24 hr tablet    Sig: Take 1 tablet (50 mg total) by mouth daily.    Dispense:  90 tablet    Refill:  3    Patient Instructions  Medication Instructions:  INCREASE- Metoprolol Succinate 50 mg by mouth daily  *If you need a refill on your cardiac medications before your next appointment, please call your pharmacy*   Lab Work: BMP and Magnesium Today  If you have labs (blood work) drawn today and your tests are completely normal, you will receive your results only by: . Marland KitchenyChart Message (if you have MyChart) OR . A paper copy in the mail If you have any lab test that is abnormal or we need to change your treatment, we will call you to review the results.   Testing/Procedures: Your physician has recommended that you wear a Zio monitor for 7 days. Holter monitors are medical devices that record the heart's electrical activity. Doctors most often use these monitors to diagnose arrhythmias.  Arrhythmias are problems with the speed or rhythm of the heartbeat. The monitor is a small, portable device. You can wear one while you do your normal daily activities. This is usually used to diagnose what is causing palpitations/syncope (passing out).   Follow-Up: [facility] and your health needs are our priority.  As part of our continuing mission to provide you with exceptional heart care, we have created designated Provider Care Teams.  These Care Teams include your primary Cardiologist (physician) and Advanced Practice Providers (APPs -  Physician Assistants and Nurse Practitioners) who all work together to provide you with the care you need, when you need it.  We recommend signing up for the patient portal called "MyChart".  Sign up information is provided on this After Visit Summary.  MyChart is used to connect with patients for Virtual Visits (Telemedicine).  Patients are able to view lab/test results, encounter notes, upcoming appointments, etc.  Non-urgent messages can be sent to your provider as well.   To learn more about what you can do with MyChart, go to [email or web address]   Your next appointment:   Friday April 1st @ 2:45 PM  The format for your next appointment:   In Person  Provider:   You will see one of the following Advanced Practice Providers on your designated Care Team:    CaSande Rives  Signed, Darreld Mclean, PA-C  07/23/2020 11:53 AM    [facility]

## 2020-07-23 ENCOUNTER — Ambulatory Visit: Payer: 59 | Admitting: Student

## 2020-07-23 ENCOUNTER — Ambulatory Visit (INDEPENDENT_AMBULATORY_CARE_PROVIDER_SITE_OTHER): Payer: 59

## 2020-07-23 ENCOUNTER — Other Ambulatory Visit: Payer: Self-pay

## 2020-07-23 ENCOUNTER — Encounter: Payer: Self-pay | Admitting: Student

## 2020-07-23 ENCOUNTER — Encounter: Payer: Self-pay | Admitting: Radiology

## 2020-07-23 VITALS — BP 110/74 | HR 84 | Ht 64.0 in | Wt 139.8 lb

## 2020-07-23 DIAGNOSIS — I2510 Atherosclerotic heart disease of native coronary artery without angina pectoris: Secondary | ICD-10-CM

## 2020-07-23 DIAGNOSIS — R002 Palpitations: Secondary | ICD-10-CM | POA: Diagnosis not present

## 2020-07-23 DIAGNOSIS — I2699 Other pulmonary embolism without acute cor pulmonale: Secondary | ICD-10-CM

## 2020-07-23 DIAGNOSIS — E785 Hyperlipidemia, unspecified: Secondary | ICD-10-CM

## 2020-07-23 DIAGNOSIS — Z951 Presence of aortocoronary bypass graft: Secondary | ICD-10-CM | POA: Diagnosis not present

## 2020-07-23 DIAGNOSIS — I10 Essential (primary) hypertension: Secondary | ICD-10-CM

## 2020-07-23 DIAGNOSIS — R079 Chest pain, unspecified: Secondary | ICD-10-CM

## 2020-07-23 DIAGNOSIS — E118 Type 2 diabetes mellitus with unspecified complications: Secondary | ICD-10-CM

## 2020-07-23 MED ORDER — METOPROLOL SUCCINATE ER 50 MG PO TB24: 50.0000 mg | ORAL_TABLET | Freq: Every day | ORAL | 3 refills | Status: DC

## 2020-07-23 MED ORDER — NITROGLYCERIN 0.4 MG SL SUBL: 0.4000 mg | SUBLINGUAL_TABLET | SUBLINGUAL | 3 refills | Status: DC | PRN

## 2020-07-23 NOTE — Progress Notes (Signed)
Enrolled patient for a 7 day Zio XT Monitor to be mailed to patients home.  

## 2020-07-23 NOTE — Patient Instructions (Signed)
Medication Instructions:  INCREASE- Metoprolol Succinate 50 mg by mouth daily  *If you need a refill on your cardiac medications before your next appointment, please call your pharmacy*   Lab Work: BMP and Magnesium Today  If you have labs (blood work) drawn today and your tests are completely normal, you will receive your results only by: Marland Kitchen MyChart Message (if you have MyChart) OR . A paper copy in the mail If you have any lab test that is abnormal or we need to change your treatment, we will call you to review the results.   Testing/Procedures: Your physician has recommended that you wear a Zio monitor for 7 days. Holter monitors are medical devices that record the heart's electrical activity. Doctors most often use these monitors to diagnose arrhythmias. Arrhythmias are problems with the speed or rhythm of the heartbeat. The monitor is a small, portable device. You can wear one while you do your normal daily activities. This is usually used to diagnose what is causing palpitations/syncope (passing out).   Follow-Up: At [facility], you and your health needs are our priority.  As part of our continuing mission to provide you with exceptional heart care, we have created designated Provider Care Teams.  These Care Teams include your primary Cardiologist (physician) and Advanced Practice Providers (APPs -  Physician Assistants and Nurse Practitioners) who all work together to provide you with the care you need, when you need it.  We recommend signing up for the patient portal called "MyChart".  Sign up information is provided on this After Visit Summary.  MyChart is used to connect with patients for Virtual Visits (Telemedicine).  Patients are able to view lab/test results, encounter notes, upcoming appointments, etc.  Non-urgent messages can be sent to your provider as well.   To learn more about what you can do with MyChart, go to [email or web address].    Your next appointment:    Friday April 1st @ 2:45 PM  The format for your next appointment:   In Person  Provider:   You will see one of the following Advanced Practice Providers on your designated Care Team:    Marjie Skiff

## 2020-07-24 LAB — BASIC METABOLIC PANEL
BUN/Creatinine Ratio: 9 (ref 9–23)
BUN: 7 mg/dL (ref 6–24)
CO2: 21 mmol/L (ref 20–29)
Calcium: 9.4 mg/dL (ref 8.7–10.2)
Chloride: 104 mmol/L (ref 96–106)
Creatinine, Ser: 0.75 mg/dL (ref 0.57–1.00)
GFR calc Af Amer: 102 mL/min/{1.73_m2} (ref >59)
GFR calc non Af Amer: 89 mL/min/{1.73_m2} (ref >59)
Glucose: 130 mg/dL — ABNORMAL HIGH (ref 65–99)
Potassium: 4.5 mmol/L (ref 3.5–5.2)
Sodium: 141 mmol/L (ref 134–144)

## 2020-07-24 LAB — MAGNESIUM: Magnesium: 2 mg/dL (ref 1.6–2.3)

## 2020-07-25 ENCOUNTER — Encounter: Payer: Self-pay | Admitting: Family

## 2020-07-25 NOTE — Telephone Encounter (Signed)
Will request reports.

## 2020-07-28 ENCOUNTER — Other Ambulatory Visit: Payer: Self-pay | Admitting: Family

## 2020-07-28 ENCOUNTER — Ambulatory Visit: Payer: 59 | Admitting: Podiatry

## 2020-07-28 ENCOUNTER — Encounter: Payer: Self-pay | Admitting: Family

## 2020-07-28 DIAGNOSIS — R002 Palpitations: Secondary | ICD-10-CM | POA: Diagnosis not present

## 2020-07-28 MED ORDER — BASAGLAR KWIKPEN 100 UNIT/ML ~~LOC~~ SOPN: 12.0000 [IU] | PEN_INJECTOR | Freq: Every day | SUBCUTANEOUS | 0 refills | Status: DC

## 2020-07-28 NOTE — Telephone Encounter (Signed)
[facility] request faxed to  [facility]

## 2020-08-01 ENCOUNTER — Encounter: Payer: Self-pay | Admitting: Family

## 2020-08-04 ENCOUNTER — Other Ambulatory Visit: Payer: Self-pay

## 2020-08-04 ENCOUNTER — Ambulatory Visit: Payer: 59 | Admitting: Family

## 2020-08-04 VITALS — BP 120/75 | HR 86 | Temp 98.5°F | Resp 16 | Ht 64.0 in | Wt 139.0 lb

## 2020-08-04 DIAGNOSIS — H04123 Dry eye syndrome of bilateral lacrimal glands: Secondary | ICD-10-CM | POA: Diagnosis not present

## 2020-08-04 DIAGNOSIS — R519 Headache, unspecified: Secondary | ICD-10-CM | POA: Diagnosis not present

## 2020-08-04 DIAGNOSIS — F411 Generalized anxiety disorder: Secondary | ICD-10-CM

## 2020-08-04 MED ORDER — SITAGLIPTIN PHOSPHATE 25 MG PO TABS: 25.0000 mg | ORAL_TABLET | Freq: Every day | ORAL | 1 refills | Status: DC

## 2020-08-04 MED ORDER — NYSTATIN 100000 UNIT/ML MT SUSP: 5.0000 mL | Freq: Four times a day (QID) | OROMUCOSAL | 0 refills | Status: DC

## 2020-08-04 MED ORDER — TRAMADOL HCL 50 MG PO TABS
50.0000 mg | ORAL_TABLET | Freq: Three times a day (TID) | ORAL | 0 refills | Status: AC | PRN
Start: 2020-08-04 — End: 2020-08-09

## 2020-08-04 NOTE — Patient Instructions (Signed)
Please use tramadol as needed for moderate/severe headache. You may use tylenol as needed for less severe headache. Call if headache worsens or if not improved in the next few weeks. Go to ER if you develop a severe headache.

## 2020-08-04 NOTE — Progress Notes (Signed)
Subjective:    Patient ID: Bailey Greene, female    DOB: [birth year], 59 y.o.   MRN: [id number]  HPI  Patient is a 60 yr old female who presents today with chief complaint of headache. HA is intermittent.  Tylenol helps some. Has associated nausea and dizziness, no vomiting. Reports that her HA is not as severe as her previous migraines. Describes pain as high as an 8-9/10 when it occurs.    She notes significant anxiety recently.   Review of Systems See HPI  Past Medical History:  Diagnosis Date  . Anxiety   . Bipolar affective disorder ([place])   . CAD (coronary artery disease) 2006   CABG w/ LIMA-LAD, RIMA-Diag, SVG-OM1-OM2, R radial-PDA  . 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   Qualifier: Diagnosis of  By: Garen Grams    . Diabetes mellitus type 2 in nonobese ([place]) 07/2016  . Dyslipidemia   . Headache(784.0)   . HTN (hypertension)   . Migraine      Social History   Socioeconomic History  . Marital status: Legally Separated    Spouse name: Not on file  . Number of children: Not on file  . Years of education: Not on file  . Highest education level: Not on file  Occupational History  . Occupation: Insurance claims handler  Tobacco Use  . Smoking status: Current Every Day Smoker    Packs/day: 0.50    Years: 25.00    Pack years: 12.50    Types: Cigarettes  . Smokeless tobacco: Never Used  Vaping Use  . Vaping Use: Never used  Substance and Sexual Activity  . Alcohol use: No    Alcohol/week: 0.0 standard drinks  . Drug use: Never  . Sexual activity: Yes    Partners: Male    Comment: married  Other Topics Concern  . Not on file  Social History Narrative   Regular exercise:  3 days weekly   Caffeine Use:  1 soda daily   One child biological daughter born in 41 and an adopted niece.   [employer] specialist   Married- may be divorcing.          Social Determinants of Health   Financial Resource Strain: Not on file  Food Insecurity: Not on file   Transportation Needs: Not on file  Physical Activity: Not on file  Stress: Not on file  Social Connections: Not on file  Intimate Partner Violence: Not on file    Past Surgical History:  Procedure Laterality Date  . BUNIONECTOMY  08/2011   right foot  . CARDIAC CATHETERIZATION  2007   severe native 3 v dz, all grafts patent (LIMA-LAD, RIMA-Diag, SVG-OM1-OM2, R radial-PDA)  . CESAREAN SECTION  1984  . CORONARY ARTERY BYPASS GRAFT  2006    Coronary artery bypass grafting x5 with a left  internal  mammary to the left anterior descending coronary artery.  Free right  internal mammary to the diagonal coronary artery, sequential reverse  saphenous vein graft to the first and second obtuse marginal, right  artery bypass to the posterior descending coronary artery with endo-vein harvesting.    Family History  Problem Relation Age of Onset  . Lupus Mother   . Heart attack Father   . Diabetes Father   . Hypertension Father   . Diabetes Paternal Grandmother   . Hypertension Paternal Grandmother   . Stroke Neg Hx   . Kidney disease Neg Hx   . Hyperlipidemia Neg Hx   . Sudden  death Neg Hx     No Known Allergies  Current Outpatient Medications on File Prior to Visit  Medication Sig Dispense Refill  . ACCU-CHEK GUIDE test strip USE UP TO FOUR TIMES DAILY AS DIRECTED 200 strip 12  . Accu-Chek Softclix Lancets lancets USE UP TO FOUR TIMES DAILY AS DIRECTED 200 each 12  . ALPRAZolam (XANAX) 0.5 MG tablet TAKE 2 TABLETS BY MOUTH TWICE A DAY AS NEEDED FOR ANXIETY 60 tablet 1  . APIXABAN (ELIQUIS) VTE STARTER PACK (10MG AND 5MG) Take as directed on package: start with two-52m tablets twice daily for 7 days. On day 8, switch to one-511mtablet twice daily. 1 each 0  . blood glucose meter kit and supplies KIT Dispense based on patient and insurance preference. Use up to four times daily as directed. (FOR ICD-9 250.00, 250.01). 1 each 0  . busPIRone (BUSPAR) 30 MG tablet TAKE 1 TABLET BY MOUTH 2  TIMES DAILY. (Patient taking differently: Take 30 mg by mouth 2 times daily at 12 noon and 4 pm.) 180 tablet 0  . divalproex (DEPAKOTE ER) 500 MG 24 hr tablet Take 3 tablets (1,500 mg total) by mouth at bedtime. 270 tablet 0  . Insulin Glargine (BASAGLAR KWIKPEN) 100 UNIT/ML Inject 12 Units into the skin daily. 9 mL 0  . metoprolol succinate (TOPROL-XL) 50 MG 24 hr tablet Take 1 tablet (50 mg total) by mouth daily. 90 tablet 3  . nitroGLYCERIN (NITROSTAT) 0.4 MG SL tablet Place 1 tablet (0.4 mg total) under the tongue every 5 (five) minutes as needed for chest pain. 25 tablet 3  . PARoxetine (PAXIL) 20 MG tablet TAKE 2 TABLETS BY MOUTH EVERY DAY (Patient taking differently: Take 40 mg by mouth daily.) 180 tablet 0  . QUEtiapine (SEROQUEL) 25 MG tablet TAKE 1-2 TABLETS (25-50 MG TOTAL) BY MOUTH AT BEDTIME. (Patient taking differently: Take 25 mg by mouth at bedtime.) 180 tablet 0  . rivaroxaban (XARELTO) 20 MG TABS tablet Take 1 tablet (20 mg total) by mouth daily with supper. 30 tablet 1  . simvastatin (ZOCOR) 80 MG tablet TAKE 1 TABLET BY MOUTH EVERYDAY AT BEDTIME (Patient taking differently: Take 80 mg by mouth at bedtime.) 90 tablet 3  . XIIDRA 5 % SOLN Place 1 drop into both eyes in the morning and at bedtime.     No current facility-administered medications on file prior to visit.    BP 120/75 (BP Location: Right Arm, Patient Position: Sitting, Cuff Size: Small)   Pulse 86   Temp 98.5 F (36.9 C) (Oral)   Resp 16   Ht _0  (1.626 m)   Wt 139 lb (63 kg)   LMP 11/28/2009   SpO2 100%   BMI 23.86 kg/m       Objective:   Physical Exam Constitutional:      Appearance: She is well-developed and well-nourished.  Cardiovascular:     Rate and Rhythm: Normal rate and regular rhythm.     Heart sounds: Normal heart sounds. No murmur heard.   Pulmonary:     Effort: Pulmonary effort is normal. No respiratory distress.     Breath sounds: Normal breath sounds. No wheezing.  Neurological:      Mental Status: She is alert.     Cranial Nerves: No cranial nerve deficit.     Motor: No weakness.     Deep Tendon Reflexes:     Reflex Scores:      Patellar reflexes are 2+ on the right side  and 2+ on the left side. Psychiatric:        Attention and Perception: Attention normal.        Mood and Affect: Mood is anxious.        Speech: Speech normal.        Behavior: Behavior normal.        Thought Content: Thought content normal.        Cognition and Memory: Cognition normal.        Judgment: Judgment normal.           Assessment & Plan:  Headache-would avoid NSAIDs due to anticoagulation.  Should also avoid triptans due to her history of coronary artery disease.  She has no neurological deficits today.  I have advised the patient to use tramadol sparingly as needed.  If headache worsens her headache fails to improve in the next few days, she will let me know and I will plan referral to neurology.  Dry eyes-patient continues to complain of dry eyes.  I advised her to follow-up with her eye doctor.  Anxiety-she appears quite anxious today.  She does have an upcoming appointment with her psychiatrist, however she could not get in until the end of April.  She has a therapist, however is in between therapists as she has been reassigned.  She is waiting to have her first visit with her new therapist.  I encouraged her to follow through with this.  This visit occurred during the SARS-CoV-2 public health emergency.  Safety protocols were in place, including screening questions prior to the visit, additional usage of staff PPE, and extensive cleaning of exam room while observing appropriate contact time as indicated for disinfecting solutions.

## 2020-08-06 ENCOUNTER — Ambulatory Visit (INDEPENDENT_AMBULATORY_CARE_PROVIDER_SITE_OTHER): Payer: 59

## 2020-08-06 ENCOUNTER — Other Ambulatory Visit: Payer: Self-pay

## 2020-08-06 ENCOUNTER — Ambulatory Visit: Payer: 59 | Admitting: Podiatry

## 2020-08-06 DIAGNOSIS — E1149 Type 2 diabetes mellitus with other diabetic neurological complication: Secondary | ICD-10-CM

## 2020-08-06 DIAGNOSIS — R200 Anesthesia of skin: Secondary | ICD-10-CM

## 2020-08-06 DIAGNOSIS — E1151 Type 2 diabetes mellitus with diabetic peripheral angiopathy without gangrene: Secondary | ICD-10-CM | POA: Diagnosis not present

## 2020-08-06 DIAGNOSIS — R202 Paresthesia of skin: Secondary | ICD-10-CM

## 2020-08-06 DIAGNOSIS — E1140 Type 2 diabetes mellitus with diabetic neuropathy, unspecified: Secondary | ICD-10-CM

## 2020-08-06 NOTE — Progress Notes (Signed)
Subjective:   Patient ID: Bailey Greene, female   DOB: 60 y.o.   MRN: [id number]   HPI Patient presents stating that she has had a lot of numbness and tingling over the last 2 months and she is recently been diagnosed with diabetes with her A1c being 13.8.  Patient states she also smokes a half a pack per day and is now working diligently to get her sugar under control and stop smoking   Review of Systems  All other systems reviewed and are negative.       Objective:  Physical Exam Vitals and nursing note reviewed.  Constitutional:      Appearance: She is well-developed and well-nourished.  Cardiovascular:     Pulses: Intact distal pulses.  Pulmonary:     Effort: Pulmonary effort is normal.  Musculoskeletal:        General: Normal range of motion.  Skin:    General: Skin is warm.  Neurological:     Mental Status: She is alert.     Neurovascular status was found to be moderately diminished with diminishment of PT DP pulses sharp dull vibratory.  Upon questioning she does not appear to have claudication-like symptoms but right now she is in poor health due to recent diagnosis of diabetes with neuropathic symptoms      Assessment:  Concerns that this is related certainly to her diabetes with the hope that since her sugars been so high recently that if it comes down she will improve     Plan:  H&P and a great deal of education rendered to patient concerning diabetes vascular disease with neurological disease and the relationship of the 3.  Reviewed x-rays and at this point were just cannot put her on a watch but ultimately she may require medicine for neuropathy and other treatment and possible vascular consult but since she has no claudication we will just watch her closely.  Encouraged her to call questions concerns and gave all information today on diabetic foot exams to be done daily  X-rays indicated good foot structure no indications of pathology Charcot structure or  osteolysis

## 2020-08-06 NOTE — Patient Instructions (Signed)
Diabetes Mellitus and Foot Care Foot care is an important part of your health, especially when you have diabetes. Diabetes may cause you to have problems because of poor blood flow (circulation) to your feet and legs, which can cause your skin to:  Become thinner and drier.  Break more easily.  Heal more slowly.  Peel and crack. You may also have nerve damage (neuropathy) in your legs and feet, causing decreased feeling in them. This means that you may not notice minor injuries to your feet that could lead to more serious problems. Noticing and addressing any potential problems early is the best way to prevent future foot problems. How to care for your feet Foot hygiene  Wash your feet daily with warm water and mild soap. Do not use hot water. Then, pat your feet and the areas between your toes until they are completely dry. Do not soak your feet as this can dry your skin.  Trim your toenails straight across. Do not dig under them or around the cuticle. File the edges of your nails with an emery board or nail file.  Apply a moisturizing lotion or petroleum jelly to the skin on your feet and to dry, brittle toenails. Use lotion that does not contain alcohol and is unscented. Do not apply lotion between your toes.   Shoes and socks  Wear clean socks or stockings every day. Make sure they are not too tight. Do not wear knee-high stockings since they may decrease blood flow to your legs.  Wear shoes that fit properly and have enough cushioning. Always look in your shoes before you put them on to be sure there are no objects inside.  To break in new shoes, wear them for just a few hours a day. This prevents injuries on your feet. Wounds, scrapes, corns, and calluses  Check your feet daily for blisters, cuts, bruises, sores, and redness. If you cannot see the bottom of your feet, use a mirror or ask someone for help.  Do not cut corns or calluses or try to remove them with medicine.  If you  find a minor scrape, cut, or break in the skin on your feet, keep it and the skin around it clean and dry. You may clean these areas with mild soap and water. Do not clean the area with peroxide, alcohol, or iodine.  If you have a wound, scrape, corn, or callus on your foot, look at it several times a day to make sure it is healing and not infected. Check for: ? Redness, swelling, or pain. ? Fluid or blood. ? Warmth. ? Pus or a bad smell.   General tips  Do not cross your legs. This may decrease blood flow to your feet.  Do not use heating pads or hot water bottles on your feet. They may burn your skin. If you have lost feeling in your feet or legs, you may not know this is happening until it is too late.  Protect your feet from hot and cold by wearing shoes, such as at the beach or on hot pavement.  Schedule a complete foot exam at least once a year (annually) or more often if you have foot problems. Report any cuts, sores, or bruises to your health care provider immediately. Where to find more information  American Diabetes Association: www.diabetes.org  Association of Diabetes Care & Education Specialists: www.diabeteseducator.org Contact a health care provider if:  You have a medical condition that increases your risk of infection and   you have any cuts, sores, or bruises on your feet.  You have an injury that is not healing.  You have redness on your legs or feet.  You feel burning or tingling in your legs or feet.  You have pain or cramps in your legs and feet.  Your legs or feet are numb.  Your feet always feel cold.  You have pain around any toenails. Get help right away if:  You have a wound, scrape, corn, or callus on your foot and: ? You have pain, swelling, or redness that gets worse. ? You have fluid or blood coming from the wound, scrape, corn, or callus. ? Your wound, scrape, corn, or callus feels warm to the touch. ? You have pus or a bad smell coming from  the wound, scrape, corn, or callus. ? You have a fever. ? You have a red line going up your leg. Summary  Check your feet every day for blisters, cuts, bruises, sores, and redness.  Apply a moisturizing lotion or petroleum jelly to the skin on your feet and to dry, brittle toenails.  Wear shoes that fit properly and have enough cushioning.  If you have foot problems, report any cuts, sores, or bruises to your health care provider immediately.  Schedule a complete foot exam at least once a year (annually) or more often if you have foot problems. This information is not intended to replace advice given to you by your health care provider. Make sure you discuss any questions you have with your health care provider. Document Revised: 12/06/2019 Document Reviewed: 12/06/2019 Elsevier Patient Education  2021 [employer]  

## 2020-08-08 ENCOUNTER — Encounter: Payer: Self-pay | Admitting: Family

## 2020-08-14 ENCOUNTER — Ambulatory Visit (HOSPITAL_COMMUNITY): Payer: 59 | Attending: Cardiology

## 2020-08-14 ENCOUNTER — Other Ambulatory Visit: Payer: Self-pay

## 2020-08-14 DIAGNOSIS — R079 Chest pain, unspecified: Secondary | ICD-10-CM | POA: Diagnosis present

## 2020-08-14 DIAGNOSIS — R002 Palpitations: Secondary | ICD-10-CM

## 2020-08-14 LAB — COLOGUARD
COLOGUARD: NEGATIVE
Cologuard: NEGATIVE

## 2020-08-14 LAB — ECHOCARDIOGRAM COMPLETE
Area-P 1/2: 3.91 cm2
S' Lateral: 2.8 cm

## 2020-08-15 ENCOUNTER — Other Ambulatory Visit: Payer: Self-pay | Admitting: Psychiatry

## 2020-08-15 DIAGNOSIS — F4001 Agoraphobia with panic disorder: Secondary | ICD-10-CM

## 2020-08-18 NOTE — Telephone Encounter (Signed)
Controlled substance 

## 2020-08-19 ENCOUNTER — Encounter: Payer: Self-pay | Admitting: Family

## 2020-08-19 NOTE — Progress Notes (Signed)
Mailed out to patient 

## 2020-08-23 NOTE — Progress Notes (Deleted)
Cardiology Office Note:    Date:  08/23/2020   ID:  Bailey Greene, DOB [birth year], MRN [id number]  PCP:  Sandford Craze, NP  Cardiologist:  Rollene Rotunda, MD  Electrophysiologist:  None   Referring MD: Sandford Craze, NP   Chief Complaint: follow-up of chest pain  History of Present Illness:    Bailey Greene is a 60 y.o. female with a history of CAD s/p CABG x5 (LIMA-LAD, RIMA-Diag, sequential SVG-OM1-OM2, right radial-PDA) in 2006 with cardiac cath in 2008 showing patent grafts and multiple negative stress tests since then most recently in 07/2017, hypertension, dyslipidemia, type 2 diabetes mellitus, migraine, bipolar affective disorder, and anxiety who is followed by Dr. Antoine Poche and presents today for follow-up of chest pain.   Patient has a long-standing history of sharp chest pain in the setting of emotional stress. Most recent Echo in 07/2017 showed LVEF of 55-60% with normal wall motion and grade 1 diastolic dysfunction. Most recent ischemic evaluation was a Myoview in 07/2017 which was low risk with no evidence of ischemia.   Patient was recently admitted from 07/07/2020 to 07/09/2020 for acute PE after presenting with chest pain. It started with vomiting and anorexia which caused her to feel very weak. After about a week of this, she started having sporadic chest pain and felt like her heart was racing which is when she ultimately decided to go to the ED.She was diagnosed with a small PE and started on Eliquis. Aspirin was stopped. Troponin minimally elevated and flat (peaked at 38) - not consistent with ACS. She was also diagnosed with thrush and started on Nystatin. Blood sugar was also found to be poorly controlled (she reported had never been told she was diabetic). Hemoglobin A1c 13.7. She was started on insulin and Januvia.   She was seen by Dr. Antoine Poche on 07/14/2020 for follow-up. She was very tearful during this visit and reported she has never felt this bad. She  was down about 10 lbs. However, she denied any acute cardiac symptoms. No additional ischemic work-up was recommended.  I then saw the patient on 07/23/2020 at which time she continued to report left arm and chest tightness that occurred mostly with exertion but also at rest and felt similar to prior angina. However, upon further questioning it was revealed that chest pain only occurred when she was having rapid palpitations. Toprol-XL was increased and Echo and Zio monitor were ordered for further evaluation. Echo showed LVEF of 60-65% with normal wall motion. Monitor showed underlying normal sinus rhythm with one run of NSVT lasting 11 beats and one run of SVT lasting 6 beats but no sustained arrhythmias.   Patient presents today for follow-up. ***  Palpitations - Recent monitor showed underlying sinus rhythm with one short run of NSVT and SVT but no sustained arrhythmias. Echo showed normal LV function. - *** - Continue Toprol-XL to 50mg  daily.  Chronic Chest Pain History of CAD s/p CABG - History of CABG x5 in 2006 with cardiac cath in 2007 showing patent grafts. She has longstanding history of chest pain in the setting of emotional stress. She has had multiple stress test since then which have been negative most recently in 07/2017.  - *** - No aspirin due to need for DOAC. - Continue beta-blocker and statin. - Myoview ***  Recent PE - Diagnosed with PE on 07/08/2020. Plan is for anticoagulation for at least 3 months. - Patient is going to finish Eliquis start Pack and then switch to Xarelto 20mg  daily  because insurance does not cover Eliquis. - Management per PCP.  Hypertension - BP well controlled. *** - Continue Toprol-XL as above.  Dyslipidemia - Recent lipid panel 07/17/2020: Total Cholesterol 139, Triglycerides 80, HDL 58, LDL 65. - LDL goal <70 given CAD. - Continue Simvastatin 80mg  daily.  Type 2 Diabetes Mellitus - Hemoglobin A1c of 13.8 on 07/08/2020. - Management per  PCP.    Past Medical History:  Diagnosis Date  . Anxiety   . Bipolar affective disorder (HCC)   . CAD (coronary artery disease) 2006   CABG w/ LIMA-LAD, RIMA-Diag, SVG-OM1-OM2, R radial-PDA  . 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   Qualifier: Diagnosis of  By: Cheri GuppyAlleyne, Tiffany    . Diabetes mellitus type 2 in nonobese (HCC) 07/2016  . Dyslipidemia   . Headache(784.0)   . HTN (hypertension)   . Migraine     Past Surgical History:  Procedure Laterality Date  . BUNIONECTOMY  08/2011   right foot  . CARDIAC CATHETERIZATION  2007   severe native 3 v dz, all grafts patent (LIMA-LAD, RIMA-Diag, SVG-OM1-OM2, R radial-PDA)  . CESAREAN SECTION  1984  . CORONARY ARTERY BYPASS GRAFT  2006    Coronary artery bypass grafting x5 with a left  internal  mammary to the left anterior descending coronary artery.  Free right  internal mammary to the diagonal coronary artery, sequential reverse  saphenous vein graft to the first and second obtuse marginal, right  artery bypass to the posterior descending coronary artery with endo-vein harvesting.    Current Medications: No outpatient medications have been marked as taking for the 08/29/20 encounter (Appointment) with Corrin ParkerGoodrich, Wisdom Seybold E, PA-C.     Allergies:   Patient has no known allergies.   Social History   Socioeconomic History  . Marital status: Legally Separated    Spouse name: Not on file  . Number of children: Not on file  . Years of education: Not on file  . Highest education level: Not on file  Occupational History  . 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 Smoking status: Current Every Day Smoker    Packs/day: 0.50    Years: 25.00    Pack years: 12.50    Types: Cigarettes  . Smokeless tobacco: Never Used  Vaping Use  . Vaping Use: Never used  Substance and Sexual Activity  . Alcohol use: No    Alcohol/week: 0.0 standard drinks  . Drug use: Never  . Sexual activity: Yes    Partners: Male    Comment: married  Other  Topics Concern  . Not on file  Social History Narrative   Regular exercise:  3 days weekly   Caffeine Use:  1 soda daily   One child biological daughter born in [id number] and an adopted niece.   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          Social Determinants of Health   Financial Resource Strain: Not on file  Food Insecurity: Not on file  Transportation Needs: Not on file  Physical Activity: Not on file  Stress: Not on file  Social Connections: Not on file     Family History: The patient's ***family history includes Diabetes in her father and paternal grandmother; Heart attack in her father; Hypertension in her father and paternal grandmother; Lupus in her mother. There is no history of Stroke, Kidney disease, Hyperlipidemia, or Sudden death.  ROS:   Please see the history of present illness.    *** All  other systems reviewed and are negative.  EKGs/Labs/Other Studies Reviewed:    The following studies were reviewed today:  Myoview 07/27/2017:  Nuclear stress EF: 64%. No wall motion abnormalities  The left ventricular ejection fraction is normal (55-65%).  There was no ST segment deviation noted during stress.  Defect 1: There is a small defect of mild severity present in the apex location.  This is a low risk study. No ischemia identified _______________  [employer] Monitor 07/28/2020 to 08/04/2020: Patient had a min HR of 60 bpm, max HR of 203 bpm, and avg HR of 88 bpm. Predominant underlying rhythm was Sinus Rhythm. 1 run of Ventricular Tachycardia occurred lasting 6 beats with a max rate of 176 bpm (avg 166 bpm). 1 run of Supraventricular Tachycardia occurred lasting 11 beats with a max rate of 203 bpm (avg 184 bpm). Isolated SVEs were rare (<1.0%), SVE Couplets were rare (<1.0%), and SVE Triplets were rare (<1.0%). Isolated VEs were rare (<1.0%), and no VE Couplets or VE Triplets were present.   Conclusion: Normal sinus rhythm One run of non  sustained ventricular tachycardia with the longest being 11 beats One run of supraventricular tachycardia with the longest lasting six beats.   No sustained arrhythmias _______________  Echocardiogram 08/14/2020: Impressions: 1. Left ventricular ejection fraction, by estimation, is 60 to 65%. The  left ventricle has normal function. The left ventricle has no regional  wall motion abnormalities. Left ventricular diastolic parameters were  normal. The average left ventricular  global longitudinal strain is -19.5 %.  2. Right ventricular systolic function is normal. The right ventricular  size is normal.  3. The mitral valve is normal in structure. No evidence of mitral valve  regurgitation.  4. The aortic valve is normal in structure. Aortic valve regurgitation is  not visualized. No aortic stenosis is present.  EKG:  EKG not ordered today. EKG personally reviewed and demonstrates ***.  Recent Labs: 07/08/2020: TSH 0.254 07/09/2020: Hemoglobin 13.8; Platelets 161 07/17/2020: ALT 14 07/23/2020: BUN 7; Creatinine, Ser 0.75; Magnesium 2.0; Potassium 4.5; Sodium 141  Recent Lipid Panel    Component Value Date/Time   CHOL 139 07/17/2020 1009   TRIG 80 07/17/2020 1009   HDL 58 07/17/2020 1009   CHOLHDL 2.4 07/17/2020 1009   CHOLHDL 3 08/17/2016 1225   VLDL 22.8 08/17/2016 1225   LDLCALC 65 07/17/2020 1009   LDLDIRECT 145.2 07/18/2008 0821    Physical Exam:    Vital Signs: LMP 11/28/2009     Wt Readings from Last 3 Encounters:  08/04/20 139 lb (63 kg)  07/23/20 139 lb 12.8 oz (63.4 kg)  07/18/20 140 lb 9.6 oz (63.8 kg)     General: 60 y.o. female in no acute distress. HEENT: Normocephalic and atraumatic. Sclera clear. EOMs intact. Neck: Supple. No carotid bruits. No JVD. Heart: *** RRR. Distinct S1 and S2. No murmurs, gallops, or rubs. Radial and distal pedal pulses 2+ and equal bilaterally. Lungs: No increased work of breathing. Clear to ausculation bilaterally. No wheezes,  rhonchi, or rales.  Abdomen: Soft, non-distended, and non-tender to palpation. Bowel sounds present in all 4 quadrants.  MSK: Normal strength and tone for age. *** Extremities: No lower extremity edema.    Skin: Warm and dry. Neuro: Alert and oriented x3. No focal deficits. Psych: Normal affect. Responds appropriately.   Assessment:    No diagnosis found.  Plan:     Disposition: Follow up in ***   Medication Adjustments/Labs and Tests Ordered: Current medicines are reviewed  at length with the patient today.  Concerns regarding medicines are outlined above.  No orders of the defined types were placed in this encounter.  No orders of the defined types were placed in this encounter.   There are no Patient Instructions on file for this visit.   Signed, Corrin Parker, PA-C  08/23/2020 4:53 PM    [facility]

## 2020-08-29 ENCOUNTER — Ambulatory Visit: Payer: 59 | Admitting: Student

## 2020-09-01 ENCOUNTER — Other Ambulatory Visit: Payer: Self-pay | Admitting: Psychiatry

## 2020-09-01 DIAGNOSIS — F411 Generalized anxiety disorder: Secondary | ICD-10-CM

## 2020-09-01 DIAGNOSIS — F4001 Agoraphobia with panic disorder: Secondary | ICD-10-CM

## 2020-09-02 ENCOUNTER — Encounter: Payer: Self-pay | Admitting: Psychiatry

## 2020-09-02 ENCOUNTER — Other Ambulatory Visit: Payer: Self-pay

## 2020-09-02 ENCOUNTER — Other Ambulatory Visit: Payer: Self-pay | Admitting: Psychiatry

## 2020-09-02 ENCOUNTER — Ambulatory Visit (INDEPENDENT_AMBULATORY_CARE_PROVIDER_SITE_OTHER): Payer: 59 | Admitting: Psychiatry

## 2020-09-02 DIAGNOSIS — F3162 Bipolar disorder, current episode mixed, moderate: Secondary | ICD-10-CM

## 2020-09-02 DIAGNOSIS — F4001 Agoraphobia with panic disorder: Secondary | ICD-10-CM | POA: Diagnosis not present

## 2020-09-02 DIAGNOSIS — F5105 Insomnia due to other mental disorder: Secondary | ICD-10-CM

## 2020-09-02 DIAGNOSIS — F411 Generalized anxiety disorder: Secondary | ICD-10-CM | POA: Diagnosis not present

## 2020-09-02 NOTE — Progress Notes (Signed)
Bailey Greene [id number] February 23, 1961 60 y.o.   Subjective:   Patient ID:  Bailey Greene is a 60 y.o. (DOB Oct 22, 1960) female.  Chief Complaint:  Chief Complaint  Patient presents with  . Follow-up  . Moderate mixed bipolar I disorder ([place])  . Anxiety  . Sleeping Problem    Anxiety Symptoms include nervous/anxious behavior. Patient reports no decreased concentration, dizziness or nausea.     Bailey Greene presents for follow-up of bipolar disorder and panic attacks and general anxiety.  visit 5/22,2020.  Increased Depakote then to 1500 mg daily.  Boss rec LOA for 4 weeks.  Going through separation is really tough.  Panic attacks at work interfering.  Had this job 10 years.  Likes the job but hard to concentrate and stay focused.  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  Panic incr pulse and SOB, fear, sweating and shakey.  Panic lasts 20 mins and increased frequency.  Several in a day.  Going on for 2 mos but getting worse.  Boss can tell from listening to her calls and drop in production.  At follow up visit November 22, 2018.  Mood swings are better.  Trouble staying asleep.  Caffeine 1 coffee and 1 soda daily.She was still having severe anxiety plus panic.  We discussed the risk of SSRIs trickling triggering mood swings but the severity of the anxiety was such that we decided to initiate fluoxetine 10 mg daily to increase to 20 mg daily.  We also were using low-dose mirtazapine to help with sleep.  seen January 18, 2019.  She was granted medical leave for panic symptoms.  She was switched to paroxetine for panic from fluoxetine.  Since she was switched from mirtazapine to quetiapine 25 to 50 mg nightly for insomnia.   November 2020 visit with the following noted: It is helping some with anxiety but a lot of stress.  No unusual mood swings without a change.   She's satisfied with the 20 mg paroxetine. Sleep is much better with quetiapine and xanax at night.  5 hour and  sometimes better depending on work schedule.   No meds were changed.    10/23/2019 visit with the following noted: Anxiety, dep, panic attacks all worse lately for 2 mos.  Anger also worse mostly just at home bc manages it at work.  Several triggers.  Sister passed March 26 after illness.  Work and daughter are stressful.  Overwhelming. Not as good staying asleep.  Random panic.  Feels SOB.  Wanting to isolate. Spontaneous crying spell.s Poor concentration affecting work.    Plan: Increase paroxetine to 1-1/2 of the 20 mg tablets daily For sleep increase quetiapine to 2 the 25 mg tablets nightly  03/05/20 appt with following noted: Less anxious and sleeping is better.  Panic can be triggered at work with SOB and heart racing.  Happens about 2 times weekly. Main stress is work is overwhelming.  Talk with customers all day.   No clear mania.   Still some depression too. No SE Plan: no med changes except increase paroxetine to 40 for anxiety  09/02/2020 appt noted: Dx DM since here. Anxiety and panic worse since January.  Consistent with meds.  Stress dx DM.  Sister passed away.  Panic with SOB and occ sweats.  Panic daily. Usually before noon usually with trigger. Xanax makes her relax. 1 cup coffee AM.   Sleep 4-5 hours with quetiapine 25 mg.   And pretty consistent. Mood feels labile.  About to  lose it including irritable and angry.    PDMP only shows Xanax.  She denies abusing substances  Past Psychiatric Medication Trials: Hydroxyzine, mirtazapine poor response for sleep,  Depakote,  Xanax, buspirone,  duloxetine, citalopram, Wellbutrin, fluoxetine, paroxetine  quetiapine low-dose for sleep  Notes and chart were reviewed with the patient regarding prior history and symptoms.  Review of Systems:  Review of Systems  Gastrointestinal: Negative for diarrhea and nausea.  Endocrine: Negative for polyuria.  Neurological: Negative for dizziness, tremors and weakness.   Psychiatric/Behavioral: Negative for decreased concentration, dysphoric mood and sleep disturbance. The patient is nervous/anxious.     Medications: I have reviewed the patient's current medications.  Current Outpatient Medications  Medication Sig Dispense Refill  . ACCU-CHEK GUIDE test strip USE UP TO FOUR TIMES DAILY AS DIRECTED 200 strip 12  . Accu-Chek Softclix Lancets lancets USE UP TO FOUR TIMES DAILY AS DIRECTED 200 each 12  . ALPRAZolam (XANAX) 0.5 MG tablet TAKE 2 TABLETS BY MOUTH TWICE A DAY AS NEEDED FOR ANXIETY 60 tablet 0  . APIXABAN (ELIQUIS) VTE STARTER PACK (10MG AND 5MG) Take as directed on package: start with two-17m tablets twice daily for 7 days. On day 8, switch to one-549mtablet twice daily. 1 each 0  . APIXABAN (ELIQUIS) VTE STARTER PACK (10MG AND 5MG) TAKE AS DIRECTED ON PACKAGE: START WITH 2 TABLETS (5MG) BY MOUTH TWICE DAILY FOR 7 DAYS. ON DAY 8, SWITCH TO 1 TABLET (5MG) BY MOUTH TWICE DAILY (Patient taking differently: TAKE AS DIRECTED ON PACKAGE: START WITH 2 TABLETS (5MG) BY MOUTH TWICE DAILY FOR 7 DAYS. ON DAY 8, SWITCH TO 1 TABLET (5MG) BY MOUTH TWICE DAILY) 74 tablet 0  . blood glucose meter kit and supplies KIT Dispense based on patient and insurance preference. Use up to four times daily as directed. (FOR ICD-9 250.00, 250.01). 1 each 0  . busPIRone (BUSPAR) 30 MG tablet TAKE 1 TABLET BY MOUTH 2 TIMES DAILY. (Patient taking differently: Take 30 mg by mouth 2 times daily at 12 noon and 4 pm.) 180 tablet 0  . divalproex (DEPAKOTE ER) 500 MG 24 hr tablet Take 3 tablets (1,500 mg total) by mouth at bedtime. 270 tablet 0  . Insulin Glargine (BASAGLAR KWIKPEN) 100 UNIT/ML Inject 12 Units into the skin daily. 9 mL 0  . insulin glargine (LANTUS) 100 UNIT/ML Solostar Pen INJECT 10 UNITS INTO THE SKIN DAILY. 9 mL 0  . Insulin Pen Needle 32G X 4 MM MISC USE AS DIRECTED WITH INSULIN (Patient taking differently: See admin instructions. with insulin) 100 each 0  . metoprolol  succinate (TOPROL-XL) 50 MG 24 hr tablet Take 1 tablet (50 mg total) by mouth daily. 90 tablet 3  . nitroGLYCERIN (NITROSTAT) 0.4 MG SL tablet Place 1 tablet (0.4 mg total) under the tongue every 5 (five) minutes as needed for chest pain. 25 tablet 3  . PARoxetine (PAXIL) 20 MG tablet TAKE 2 TABLETS BY MOUTH EVERY DAY (Patient taking differently: Take 40 mg by mouth daily.) 180 tablet 0  . QUEtiapine (SEROQUEL) 25 MG tablet TAKE 1-2 TABLETS (25-50 MG TOTAL) BY MOUTH AT BEDTIME. (Patient taking differently: Take 25 mg by mouth at bedtime.) 180 tablet 0  . rivaroxaban (XARELTO) 20 MG TABS tablet Take 1 tablet (20 mg total) by mouth daily with supper. 30 tablet 1  . simvastatin (ZOCOR) 80 MG tablet TAKE 1 TABLET BY MOUTH EVERYDAY AT BEDTIME (Patient taking differently: Take 80 mg by mouth at bedtime.) 90 tablet 3  .  sitaGLIPtin (JANUVIA) 25 MG tablet Take 1 tablet (25 mg total) by mouth daily. 90 tablet 1  . XIIDRA 5 % SOLN Place 1 drop into both eyes in the morning and at bedtime.    Marland Kitchen nystatin (MYCOSTATIN) 100000 UNIT/ML suspension Take 5 mLs (500,000 Units total) by mouth 4 (four) times daily. (Patient not taking: Reported on 09/02/2020) 200 mL 0   No current facility-administered medications for this visit.    Medication Side Effects: ? EMA with Paxil not manic  Allergies: No Known Allergies  Past Medical History:  Diagnosis Date  . Anxiety   . Bipolar affective disorder ([place])   . CAD (coronary artery disease) 2006   CABG w/ LIMA-LAD, RIMA-Diag, SVG-OM1-OM2, R radial-PDA  . 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   Qualifier: Diagnosis of  By: Garen Grams    . Diabetes mellitus type 2 in nonobese ([place]) 07/2016  . Dyslipidemia   . Headache(784.0)   . HTN (hypertension)   . Migraine     Family History  Problem Relation Age of Onset  . Lupus Mother   . Heart attack Father   . Diabetes Father   . Hypertension Father   . Diabetes Paternal Grandmother   . Hypertension Paternal Grandmother   .  Stroke Neg Hx   . Kidney disease Neg Hx   . Hyperlipidemia Neg Hx   . Sudden death Neg Hx     Social History   Socioeconomic History  . Marital status: Legally Separated    Spouse name: Not on file  . Number of children: Not on file  . Years of education: Not on file  . Highest education level: Not on file  Occupational History  . Occupation: Insurance claims handler  Tobacco Use  . Smoking status: Current Every Day Smoker    Packs/day: 0.50    Years: 25.00    Pack years: 12.50    Types: Cigarettes  . Smokeless tobacco: Never Used  Vaping Use  . Vaping Use: Never used  Substance and Sexual Activity  . Alcohol use: No    Alcohol/week: 0.0 standard drinks  . Drug use: Never  . Sexual activity: Yes    Partners: Male    Comment: married  Other Topics Concern  . Not on file  Social History Narrative   Regular exercise:  3 days weekly   Caffeine Use:  1 soda daily   One child biological daughter born in 23 and an adopted niece.   [place] specialist   Married- may be divorcing.          Social Determinants of Health   Financial Resource Strain: Not on file  Food Insecurity: Not on file  Transportation Needs: Not on file  Physical Activity: Not on file  Stress: Not on file  Social Connections: Not on file  Intimate Partner Violence: Not on file    Past Medical History, Surgical history, Social history, and Family history were reviewed and updated as appropriate.   Please see review of systems for further details on the patient's review from today.   Objective:   Physical Exam:  LMP 11/28/2009   Physical Exam Constitutional:      General: She is not in acute distress.    Appearance: She is well-developed.  Musculoskeletal:        General: No deformity.  Neurological:     Mental Status: She is alert and oriented to person, place, and time.     Coordination: Coordination normal.  Psychiatric:  Attention and Perception: She is  attentive. She does not perceive auditory hallucinations.        Mood and Affect: Mood is anxious and depressed. Affect is not labile, blunt, angry, tearful or inappropriate.        Speech: Speech normal. Speech is not rapid and pressured.        Behavior: Behavior normal. Behavior is not agitated.        Thought Content: Thought content normal. Thought content does not include homicidal or suicidal ideation. Thought content does not include homicidal or suicidal plan.        Cognition and Memory: Cognition normal.        Judgment: Judgment normal.     Comments: Insight intact. No auditory or visual hallucinations. No delusions.       Lab Review:     Component Value Date/Time   NA 141 07/23/2020 1300   K 4.5 07/23/2020 1300   CL 104 07/23/2020 1300   CO2 21 07/23/2020 1300   GLUCOSE 130 (H) 07/23/2020 1300   GLUCOSE 185 (H) 07/09/2020 0306   BUN 7 07/23/2020 1300   CREATININE 0.75 07/23/2020 1300   CREATININE 0.72 06/22/2012 0840   CALCIUM 9.4 07/23/2020 1300   PROT 5.8 (L) 07/17/2020 1009   ALBUMIN 3.6 (L) 07/17/2020 1009   AST 24 07/17/2020 1009   ALT 14 07/17/2020 1009   ALKPHOS 110 07/17/2020 1009   BILITOT 0.3 07/17/2020 1009   GFRNONAA 89 07/23/2020 1300   GFRNONAA >60 07/09/2020 0306   GFRNONAA >89 06/22/2012 0840   GFRAA 102 07/23/2020 1300   GFRAA >89 06/22/2012 0840       Component Value Date/Time   WBC 11.3 (H) 07/09/2020 0306   RBC 4.76 07/09/2020 0306   HGB 13.8 07/09/2020 0306   HCT 40.6 07/09/2020 0306   PLT 161 07/09/2020 0306   MCV 85.3 07/09/2020 0306   MCH 29.0 07/09/2020 0306   MCHC 34.0 07/09/2020 0306   RDW 12.4 07/09/2020 0306   LYMPHSABS 2.7 06/22/2012 0840   MONOABS 0.5 06/22/2012 0840   EOSABS 0.1 06/22/2012 0840   BASOSABS 0.0 06/22/2012 0840    No results found for: POCLITH, LITHIUM   No results found for: PHENYTOIN, PHENOBARB, VALPROATE, CBMZ   .res Assessment: Plan:    Luverne was seen today for follow-up, moderate mixed  bipolar i disorder (hcc), anxiety and sleeping problem.  Diagnoses and all orders for this visit:  Moderate mixed bipolar I disorder (HCC)  Panic disorder with agoraphobia  Generalized anxiety disorder  Insomnia due to mental condition   Greater than 50% of face to face time with patient was spent on counseling and coordination of care. We discussed the following: Ms. Wentling has a little less uncontrolled mood swings throughout the week with rapid very rapid ultra-rapid cycling since the increase in Depakote to 1500 mg.  She also describes ongoing panic attacks some of which are precipitated by chronic ongoing stress and some occur out of the blue.  Her symptoms affect her both at home and at work.  Her symptoms have improved enough that they are not adversely affecting work function at this time.  She had had a brief leave of absence from work at the last visit but she is working now.  It was explained to her that in the context of rapid cycling we typically have to control the mood cycling before were going to be able to manage the anxiety.  She continued to use the Xanax as  needed for panic attacks.   Alternatives for ins anxiety and panic could include switching to carbamazepine, clonidine, propranolol among others  BC failure of alternatives, try alprazolam 0.5 mg AM before work.  Alternatives clonazepam, Ativan  Because of the severity of the panic continue paroxetine to 40 mg at night.  It was explained to her this could worsen her mood cycling and to let us know if that occurs.  Fortunately because no mood swings and her panic and anxiety symptoms are markedly improved.  Good response.  She still has residual anxiety but believes that stress related.  No VPA level received.   Increase Quetiapine 25 mg 2 mg HS.  Disc SE in detail.   Discussed potential metabolic side effects associated with atypical antipsychotics, as well as potential risk for movement side effects. Advised pt to  contact office if movement side effects occur.  Metabolic side effects are unlikely at this low dose of quetiapine and almost no risk of EPS at this low-dose.   We discussed the short-term risks associated with benzodiazepines including sedation and increased fall risk among others.  Discussed long-term side effect risk including dependence, potential withdrawal symptoms, and the potential eventual dose-related risk of dementia.  Asks About FMLA same as last year.  This appt was 30 mins.   Follow-up 3 mos  Lynder Parents MD, DFAPA  Please see After Visit Summary for patient specific instructions.  As soon as convenient, get the blood test in the morning.  No fasting is required.  Future Appointments  Date Time Provider [place]  10/01/2020 11:15 AM Kroeger, Lorelee Cover., PA-C [facility]  10/02/2020  1:45 PM Darreld Mclean, PA-C [facility]    No orders of the defined types were placed in this encounter.     -------------------------------

## 2020-09-06 ENCOUNTER — Ambulatory Visit
Admission: EM | Admit: 2020-09-06 | Discharge: 2020-09-06 | Disposition: A | Discharge status: Home / Self Care | Payer: 59 | Attending: Family Medicine | Admitting: Family Medicine

## 2020-09-06 ENCOUNTER — Other Ambulatory Visit: Payer: Self-pay

## 2020-09-06 DIAGNOSIS — L03213 Periorbital cellulitis: Secondary | ICD-10-CM | POA: Diagnosis not present

## 2020-09-06 MED ORDER — AMOXICILLIN-POT CLAVULANATE 875-125 MG PO TABS: 1.0000 | ORAL_TABLET | Freq: Two times a day (BID) | ORAL | 0 refills | Status: DC

## 2020-09-06 NOTE — ED Triage Notes (Signed)
Pt presents with  C/o right eye lid swelling and redness and also nasal congestion for past week

## 2020-09-08 NOTE — ED Provider Notes (Signed)
[facility]   [id number] 09/06/20 Arrival Time: 1221  ASSESSMENT & PLAN:  1. Preseptal cellulitis of right eye    Begin: Meds ordered this encounter  Medications  . amoxicillin-clavulanate (AUGMENTIN) 875-125 MG tablet    Sig: Take 1 tablet by mouth every 12 (twelve) hours.    Dispense:  20 tablet    Refill:  0   Agrees to proceed to ED should her symptoms worsen in any way.  Reviewed expectations re: course of current medical issues. Questions answered. Outlined signs and symptoms indicating need for more acute intervention. Patient verbalized understanding. After Visit Summary given.   SUBJECTIVE:  Bailey Greene is a 60 y.o. female who presents with complaint of redness/swelling around R eye; over past sev days. "Feels sore around my eye". No pain with EOM. Recent nasal congestion. No eye injury. Normal vision. Afebrile. Symptoms are stable. No OTC tx. Does not wear contact lenses.  OBJECTIVE:  Vitals:   09/06/20 1236  BP: 134/85  Pulse: 81  Resp: 20  Temp: 98 F (36.7 C)  SpO2: 95%    General appearance: alert; no distress HEENT: [place]; AT; PERRLA; no restriction of the extraocular movements OD: without reported pain; without conjunctival injection; without drainage; without corneal opacities; without limbal flush; with periorbital swelling and mild erythema that is warm to touch Neck: supple without LAD Lungs: clear to auscultation bilaterally; unlabored respirations Heart: regular rate and rhythm Skin: warm and dry Psychological: alert and cooperative; normal mood and affect    No Known Allergies  Past Medical History:  Diagnosis Date  . Anxiety   . Bipolar affective disorder (HCC)   . CAD (coronary artery disease) 2006   CABG w/ LIMA-LAD, RIMA-Diag, SVG-OM1-OM2, R radial-PDA  . 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   Qualifier: Diagnosis of  By: Cheri Guppy    . Diabetes mellitus type 2 in nonobese (HCC) 07/2016  . Dyslipidemia   .  Headache(784.0)   . HTN (hypertension)   . Migraine    Social History   Socioeconomic History  . Marital status: Legally Separated    Spouse name: Not on file  . Number of children: Not on file  . Years of education: Not on file  . Highest education level: Not on file  Occupational History  . Occupation: Armed forces training and education officer  Tobacco Use  . Smoking status: Current Every Day Smoker    Packs/day: 0.50    Years: 25.00    Pack years: 12.50    Types: Cigarettes  . Smokeless tobacco: Never Used  Vaping Use  . Vaping Use: Never used  Substance and Sexual Activity  . Alcohol use: No    Alcohol/week: 0.0 standard drinks  . Drug use: Never  . Sexual activity: Yes    Partners: Male    Comment: married  Other Topics Concern  . Not on file  Social History Narrative   Regular exercise:  3 days weekly   Caffeine Use:  1 soda daily   One child biological daughter born in 60 and an adopted niece.   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          Social Determinants of Health   Financial Resource Strain: Not on file  Food Insecurity: Not on file  Transportation Needs: Not on file  Physical Activity: Not on file  Stress: Not on file  Social Connections: Not on file  Intimate Partner Violence: Not on file   Family History  Problem Relation Age  of Onset  . Lupus Mother   . Heart attack Father   . Diabetes Father   . Hypertension Father   . Diabetes Paternal Grandmother   . Hypertension Paternal Grandmother   . Stroke Neg Hx   . Kidney disease Neg Hx   . Hyperlipidemia Neg Hx   . Sudden death Neg Hx    Past Surgical History:  Procedure Laterality Date  . BUNIONECTOMY  08/2011   right foot  . CARDIAC CATHETERIZATION  2007   severe native 3 v dz, all grafts patent (LIMA-LAD, RIMA-Diag, SVG-OM1-OM2, R radial-PDA)  . CESAREAN SECTION  1984  . CORONARY ARTERY BYPASS GRAFT  2006    Coronary artery bypass grafting x5 with a left  internal   mammary to the left anterior descending coronary artery.  Free right  internal mammary to the diagonal coronary artery, sequential reverse  saphenous vein graft to the first and second obtuse marginal, right  artery bypass to the posterior descending coronary artery with endo-vein harvesting.     Mardella Layman, MD 09/08/20 1012

## 2020-09-20 ENCOUNTER — Other Ambulatory Visit: Payer: Self-pay | Admitting: Family

## 2020-09-22 ENCOUNTER — Other Ambulatory Visit: Payer: Self-pay | Admitting: Psychiatry

## 2020-09-22 DIAGNOSIS — F4001 Agoraphobia with panic disorder: Secondary | ICD-10-CM

## 2020-09-23 NOTE — Progress Notes (Deleted)
Cardiology Office Note:    Date:  09/23/2020   ID:  Bailey Greene, DOB 1961-02-20, MRN [id number]  PCP:  Sandford Craze, NP  Cardiologist:  Rollene Rotunda, MD  Electrophysiologist:  None   Referring MD: Sandford Craze, NP   Chief Complaint: follow-up of chest pain and palpitations  History of Present Illness:    Bailey Greene is a 60 y.o. female with a history of of CAD s/p CABG x5 (LIMA-LAD, RIMA-Diag, sequential SVG-OM1-OM2, right radial-PDA) in 2006 with cardiac cath in 2008 showing patent grafts and multiple negative stress tests since then most recently in 07/2017, hypertension, dyslipidemia, type 2 diabetes mellitus, migraine, bipolar affective disorder, and anxiety who is followed by Dr. Antoine Poche and presents today for further evaluation of chest pain and palpitations.  Patient has a long-standing history of sharp chest pain in the setting of emotional stress. Most recent Echo in 07/2017 showed LVEF of 55-60% with normal wall motion and grade 1 diastolic dysfunction. Most recent ischemic evaluation was a Myoview in 07/2017 which was low risk with no evidence of ischemia.   Patient was recently admitted from 07/07/2020 to 07/09/2020 for acute PE after presenting with chest pain. It started with vomiting and anorexia which caused her to feel very weak. After about a week of this, she started having sporadic chest pain and felt like her heart was racing which is when she ultimately decided to go to the ED.She was diagnosed with a small PE and started on Eliquis. Aspirin was stopped. Troponin minimally elevated and flat (peaked at 38) - not consistent with ACS. She was also diagnosed with thrush and started on Nystatin. Blood sugar was also found to be poorly controlled (she reported had never been told she was diabetic). Hemoglobin A1c 13.7. She was started on insulin and Januvia.   She was last seen by me on 07/23/2020 at which time she continued to report left arm and chest  tightness that occurs mostly with exertional but also at rest. However, upon further question, it seem that chest pain was only occurring with rapid palpitations. She also had some lightheadedness/dizziness with this. Toprol-XL was increased and Echo and Monitor were ordered for further evaluation. Echo showed LVEF of 60-65% with normal wall motion and no significant valvular disease. Monitor showed underlying sinus rhythm with one run of NSVT of 11 beats and one run of SVT of 6 beats but no sustained arrhythmias.   Patient presents today for follow-up.  Chest Pain History of CAD s/p CABG - History of CABG x5 in 2006 with cardiac cath in 2007 showing patent grafts. She has had multiple stress test since then which have been negative most recently in 07/2017.  - At last visit, patient report chest pain in during episodes of rapid palpitations. Toprol was increased. Echo showed normal LV function with normal wall motion. Monitor showed short run of NSVT and SVT but no sustained arrhythmias.  - *** - No aspirin due to need for DOAC. - Continue beta-blocker and statin. - Myoview ***.  Palpitations - Recent Zio Monitor showed short run  of NSVT and SVT but no sustained arrhythmias. - *** - Continue Toprol-XL to 50mg  daily.  Recent PE - Diagnosed with PE on 07/08/2020. Plan is for anticoagulation for at least 3 months. - Currently on Xarelto. - Management per PCP.  Hypertension - BP well controlled. *** - Continue Toprol-XL as above.  Dyslipidemia - Recent lipid panel 07/17/2020: Total Cholesterol 139, Triglycerides 80, HDL 58, LDL 65. - LDL  goal <70 given CAD. - Continue Simvastatin 80mg  daily.  Type 2 Diabetes Mellitus - Hemoglobin A1c of 13.8 on 07/08/2020. - Management per PCP.  Past Medical History:  Diagnosis Date  . Anxiety   . Bipolar affective disorder (HCC)   . CAD (coronary artery disease) 2006   CABG w/ LIMA-LAD, RIMA-Diag, SVG-OM1-OM2, R radial-PDA  . COMMON MIGRAINE  01/31/2007   Qualifier: Diagnosis of  By: Cheri GuppyAlleyne, Tiffany    . Diabetes mellitus type 2 in nonobese (HCC) 07/2016  . Dyslipidemia   . Headache(784.0)   . HTN (hypertension)   . Migraine     Past Surgical History:  Procedure Laterality Date  . BUNIONECTOMY  08/2011   right foot  . CARDIAC CATHETERIZATION  2007   severe native 3 v dz, all grafts patent (LIMA-LAD, RIMA-Diag, SVG-OM1-OM2, R radial-PDA)  . CESAREAN SECTION  1984  . CORONARY ARTERY BYPASS GRAFT  2006    Coronary artery bypass grafting x5 with a left  internal  mammary to the left anterior descending coronary artery.  Free right  internal mammary to the diagonal coronary artery, sequential reverse  saphenous vein graft to the first and second obtuse marginal, right  artery bypass to the posterior descending coronary artery with endo-vein harvesting.    Current Medications: No outpatient medications have been marked as taking for the 10/02/20 encounter (Appointment) with Corrin ParkerGoodrich, Jahrel Borthwick E, PA-C.     Allergies:   Patient has no known allergies.   Social History   Socioeconomic History  . Marital status: Legally Separated    Spouse name: Not on file  . Number of children: Not on file  . Years of education: Not on file  . Highest education level: Not on file  Occupational History  . 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 Smoking status: Current Every Day Smoker    Packs/day: 0.50    Years: 25.00    Pack years: 12.50    Types: Cigarettes  . Smokeless tobacco: Never Used  Vaping Use  . Vaping Use: Never used  Substance and Sexual Activity  . Alcohol use: No    Alcohol/week: 0.0 standard drinks  . Drug use: Never  . Sexual activity: Yes    Partners: Male    Comment: married  Other Topics Concern  . Not on file  Social History Narrative   Regular exercise:  3 days weekly   Caffeine Use:  1 soda daily   One child biological daughter born in [id number] and an adopted niece.   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          Social Determinants of Health   Financial Resource Strain: Not on file  Food Insecurity: Not on file  Transportation Needs: Not on file  Physical Activity: Not on file  Stress: Not on file  Social Connections: Not on file     Family History: The patient's family history includes Diabetes in her father and paternal grandmother; Heart attack in her father; Hypertension in her father and paternal grandmother; Lupus in her mother. There is no history of Stroke, Kidney disease, Hyperlipidemia, or Sudden death.  ROS:   Please see the history of present illness.     EKGs/Labs/Other Studies Reviewed:    The following studies were reviewed today:  Cardiac Catheterization 06/30/2005: Coronaries: - Left main was normal. - The LAD had severe diffuse proximal disease with focal 95% stenosis compromising septal perforators. There was long mid 75% stenosis.  The LAD distal wrapped the apex. It consisted of two small diagonals. Thesevessels were free of disease. - Circumflex in the AV groove was normal.  - There was a small ramus intermediate (referred to as a diagonal in the operative note) that had proximal severe diffuse disease, OM-1 and OM-2 both had proximal severe diffuse disease. All three of these vessels were seen to fill via grafts. - The right coronary artery was a long dominant vessel. It had proximal mid long 50% stenosis. There was a distal long 70% stenosis before the PDA. The proximal portion of the PDA had 80% stenosis. The distal vessel was free of high grade disease and seemed to fill via a graft.  Grafts: - LIMA to the LAD was widely patent. - Free RIMA to the PDA was patent, though somewhat narrow in caliber. - Saphenous vein graft sequential to OM-1 and OM-2 was widely patent. - A free radial appeared to be anastomosed at its proximal segment to the origin of the saphenous vein graft. The distal anastomosis was to  the ramus intermediate (diagonal). This was a small graft but was patent and free of high grade disease.  Left ventriculogram: The left ventriculogram was obtained in the RAO projection. EF was 65% with normal wall motion.  Conclusion: Severe three vessel coronary artery disease. Patent grafts. Well preserved ejection fraction. _______________  Myoview 07/27/2017:  Nuclear stress EF: 64%. No wall motion abnormalities  The left ventricular ejection fraction is normal (55-65%).  There was no ST segment deviation noted during stress.  Defect 1: There is a small defect of mild severity present in the apex location.  This is a low risk study. No ischemia identified _______________  [facility] Monitor 07/28/2020 to 08/04/2020:  Patient had a min HR of 60 bpm, max HR of 203 bpm, and avg HR of 88 bpm. Predominant underlying rhythm was Sinus Rhythm. 1 run of Ventricular Tachycardia occurred lasting 6 beats with a max rate of 176 bpm (avg 166 bpm). 1 run of Supraventricular Tachycardia occurred lasting 11 beats with a max rate of 203 bpm (avg 184 bpm). Isolated SVEs were rare (<1.0%), SVE Couplets were rare (<1.0%), and SVE Triplets were rare (<1.0%). Isolated VEs were rare (<1.0%), and no VE Couplets or VE Triplets were present.   Normal sinus rhythm One run of non sustained ventricular tachycardia with the longest being 11 beats One run of supraventricular tachycardia with the longest lasting six beats.   No sustained arrhythmias.   _______________  Echocardiogram 08/14/2020: Impressions: 1. Left ventricular ejection fraction, by estimation, is 60 to 65%. The  left ventricle has normal function. The left ventricle has no regional  wall motion abnormalities. Left ventricular diastolic parameters were  normal. The average left ventricular  global longitudinal strain is -19.5 %.  2. Right ventricular systolic function is normal. The right ventricular  size is normal.  3. The mitral valve  is normal in structure. No evidence of mitral valve  regurgitation.  4. The aortic valve is normal in structure. Aortic valve regurgitation is  not visualized. No aortic stenosis is present.   Comparison(s): 07/19/17 EF 55-60%.    EKG:  EKG not ordered today.  Recent Labs: 07/08/2020: TSH 0.254 07/09/2020: Hemoglobin 13.8; Platelets 161 07/17/2020: ALT 14 07/23/2020: BUN 7; Creatinine, Ser 0.75; Magnesium 2.0; Potassium 4.5; Sodium 141  Recent Lipid Panel    Component Value Date/Time   CHOL 139 07/17/2020 1009   TRIG 80 07/17/2020 1009   HDL 58 07/17/2020 1009  CHOLHDL 2.4 07/17/2020 1009   CHOLHDL 3 08/17/2016 1225   VLDL 22.8 08/17/2016 1225   LDLCALC 65 07/17/2020 1009   LDLDIRECT 145.2 07/18/2008 0821    Physical Exam:    Vital Signs: LMP 11/28/2009     Wt Readings from Last 3 Encounters:  08/04/20 139 lb (63 kg)  07/23/20 139 lb 12.8 oz (63.4 kg)  07/18/20 140 lb 9.6 oz (63.8 kg)     General: 60 y.o. female in no acute distress. HEENT: Normocephalic and atraumatic. Sclera clear. EOMs intact. Neck: Supple. No carotid bruits. No JVD. Heart: *** RRR. Distinct S1 and S2. No murmurs, gallops, or rubs. Radial and distal pedal pulses 2+ and equal bilaterally. Lungs: No increased work of breathing. Clear to ausculation bilaterally. No wheezes, rhonchi, or rales.  Abdomen: Soft, non-distended, and non-tender to palpation. Bowel sounds present in all 4 quadrants.  MSK: Normal strength and tone for age. *** Extremities: No lower extremity edema.    Skin: Warm and dry. Neuro: Alert and oriented x3. No focal deficits. Psych: Normal affect. Responds appropriately.   Assessment:    No diagnosis found.  Plan:     Disposition: Follow up in ***   Medication Adjustments/Labs and Tests Ordered: Current medicines are reviewed at length with the patient today.  Concerns regarding medicines are outlined above.  No orders of the defined types were placed in this  encounter.  No orders of the defined types were placed in this encounter.   There are no Patient Instructions on file for this visit.   Signed, Corrin Parker, PA-C  09/23/2020 4:33 PM    [facility]

## 2020-09-26 ENCOUNTER — Telehealth (INDEPENDENT_AMBULATORY_CARE_PROVIDER_SITE_OTHER): Payer: 59 | Admitting: Family

## 2020-09-26 ENCOUNTER — Other Ambulatory Visit: Payer: Self-pay

## 2020-09-26 ENCOUNTER — Encounter: Payer: Self-pay | Admitting: Family

## 2020-09-26 ENCOUNTER — Telehealth: Payer: 59 | Admitting: Family

## 2020-09-26 VITALS — BP 143/93 | HR 92 | Ht 64.0 in | Wt 138.0 lb

## 2020-09-26 DIAGNOSIS — R519 Headache, unspecified: Secondary | ICD-10-CM | POA: Diagnosis not present

## 2020-09-26 DIAGNOSIS — G8929 Other chronic pain: Secondary | ICD-10-CM

## 2020-09-26 MED ORDER — DOXYCYCLINE HYCLATE 100 MG PO TABS: 100.0000 mg | ORAL_TABLET | Freq: Two times a day (BID) | ORAL | 0 refills | Status: DC

## 2020-09-26 NOTE — Progress Notes (Signed)
Bailey Greene is a 60 y.o. female with the following history as recorded in EpicCare:  Patient Active Problem List   Diagnosis Date Noted  . Pulmonary embolism ([place]) 07/08/2020  . HTN (hypertension)   . Bipolar affective disorder ([place])   . Tobacco abuse   . Hyperlipidemia with target LDL less than 70   . Hyperglycemia 07/18/2017  . Diabetes mellitus type 2 in nonobese ([place]) 07/2016  . Chest pain with moderate risk for cardiac etiology 12/27/2014  . 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 Carpal tunnel syndrome 05/03/2013  . Loss of weight 08/23/2012  . General medical examination 07/06/2011  . Goiter 09/23/2010  . Anxiety state 01/31/2007  . 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  . Essential hypertension 01/31/2007  . Coronary atherosclerosis 01/31/2007  . CAD (coronary artery disease) 2006    Current Outpatient Medications  Medication Sig Dispense Refill  . ACCU-CHEK GUIDE test strip USE UP TO FOUR TIMES DAILY AS DIRECTED 200 strip 12  . Accu-Chek Softclix Lancets lancets USE UP TO FOUR TIMES DAILY AS DIRECTED 200 each 12  . ALPRAZolam (XANAX) 0.5 MG tablet TAKE 2 TABLETS BY MOUTH TWICE A DAY AS NEEDED FOR ANXIETY 60 tablet 2  . blood glucose meter kit and supplies KIT Dispense based on patient and insurance preference. Use up to four times daily as directed. (FOR ICD-9 250.00, 250.01). 1 each 0  . busPIRone (BUSPAR) 30 MG tablet TAKE 1 TABLET BY MOUTH TWICE A DAY 180 tablet 0  . divalproex (DEPAKOTE ER) 500 MG 24 hr tablet Take 3 tablets (1,500 mg total) by mouth at bedtime. 270 tablet 0  . doxycycline (VIBRA-TABS) 100 MG tablet Take 1 tablet (100 mg total) by mouth 2 (two) times daily. 20 tablet 0  . Insulin Glargine (BASAGLAR KWIKPEN) 100 UNIT/ML Inject 12 Units into the skin daily. 9 mL 0  . insulin glargine (LANTUS) 100 UNIT/ML Solostar Pen INJECT 10 UNITS INTO THE SKIN DAILY. 9 mL 0  . Insulin Pen Needle (PEN NEEDLES) 33G X 4 MM MISC by Does not apply route.    . metoprolol succinate  (TOPROL-XL) 50 MG 24 hr tablet Take 1 tablet (50 mg total) by mouth daily. 90 tablet 3  . nitroGLYCERIN (NITROSTAT) 0.4 MG SL tablet Place 1 tablet (0.4 mg total) under the tongue every 5 (five) minutes as needed for chest pain. 25 tablet 3  . PARoxetine (PAXIL) 20 MG tablet TAKE 2 TABLETS BY MOUTH EVERY DAY 180 tablet 0  . QUEtiapine (SEROQUEL) 25 MG tablet TAKE 1-2 TABLETS (25-50 MG TOTAL) BY MOUTH AT BEDTIME. 180 tablet 0  . rivaroxaban (XARELTO) 20 MG TABS tablet Take 1 tablet (20 mg total) by mouth daily with supper. 30 tablet 3  . simvastatin (ZOCOR) 80 MG tablet TAKE 1 TABLET BY MOUTH EVERYDAY AT BEDTIME (Patient taking differently: Take 80 mg by mouth at bedtime.) 90 tablet 3  . sitaGLIPtin (JANUVIA) 25 MG tablet Take 1 tablet (25 mg total) by mouth daily. 90 tablet 1  . XIIDRA 5 % SOLN Place 1 drop into both eyes in the morning and at bedtime.     No current facility-administered medications for this visit.    Allergies: Patient has no known allergies.  Past Medical History:  Diagnosis Date  . Anxiety   . Bipolar affective disorder ([place])   . CAD (coronary artery disease) 2006   CABG w/ LIMA-LAD, RIMA-Diag, SVG-OM1-OM2, R radial-PDA  . 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   Qualifier: Diagnosis of  By: Garen Grams    .  Diabetes mellitus type 2 in nonobese ([place]) 07/2016  . Dyslipidemia   . Headache(784.0)   . HTN (hypertension)   . Migraine     Past Surgical History:  Procedure Laterality Date  . BUNIONECTOMY  08/2011   right foot  . CARDIAC CATHETERIZATION  2007   severe native 3 v dz, all grafts patent (LIMA-LAD, RIMA-Diag, SVG-OM1-OM2, R radial-PDA)  . CESAREAN SECTION  1984  . CORONARY ARTERY BYPASS GRAFT  2006    Coronary artery bypass grafting x5 with a left  internal  mammary to the left anterior descending coronary artery.  Free right  internal mammary to the diagonal coronary artery, sequential reverse  saphenous vein graft to the first and second obtuse marginal, right   artery bypass to the posterior descending coronary artery with endo-vein harvesting.    Family History  Problem Relation Age of Onset  . Lupus Mother   . Heart attack Father   . Diabetes Father   . Hypertension Father   . Diabetes Paternal Grandmother   . Hypertension Paternal Grandmother   . Stroke Neg Hx   . Kidney disease Neg Hx   . Hyperlipidemia Neg Hx   . Sudden death Neg Hx     Social History   Tobacco Use  . Smoking status: Current Every Day Smoker    Packs/day: 0.50    Years: 25.00    Pack years: 12.50    Types: Cigarettes  . Smokeless tobacco: Never Used  Substance Use Topics  . Alcohol use: No    Alcohol/week: 0.0 standard drinks    Subjective:    I connected with Olena Leatherwood on 09/26/20 at  3:00 PM EDT by a video enabled telemedicine application and verified that I am speaking with the correct person using two identifiers.   I discussed the limitations of evaluation and management by telemedicine and the availability of in person appointments. The patient expressed understanding and agreed to proceed.  Provider in office/ patient is at home; provider and patient are only 2 people on video call.   Complaining of persisting right sided headache/ right eye pain/ nasal pain; notes that eye "drains" at night/ pain is more noticeable at night; no vision changes; mentions that she has a history of cluster headaches- has taken "injections" in the past; was seen at [facility] with similar symptoms on 4/9 and treated with 10 days of Augmentin; has not not noticed any improvement in her symptoms;     Objective:  Vitals:   09/26/20 1454  BP: (!) 143/93  Pulse: 92  Weight: 138 lb (62.6 kg)  Height: _0  (1.626 m)    General: Well developed, well nourished, in no acute distress  Skin : Warm and dry.  Head: Normocephalic and atraumatic  Lungs: Respirations unlabored;  Neurologic: Alert and oriented; speech intact; face symmetrical;   Assessment:  1. Chronic  nonintractable headache, unspecified headache type     Plan:  Symptoms have now been present for almost 3 weeks; will update head CT And encouraged patient to call her eye doctor and neurologist to be seen for further evaluation; ? Atypical presentation of migraine or cluster headache; will re-treat with Doxycycline 100 mg bid x 10 days to cover in case Augmentin did not treat original infection; encouraged to see her PCP in person with continued symptoms/ concerns and she agrees;      No follow-ups on file.  Orders Placed This Encounter  Procedures  . CT Head Wo Contrast  Standing Status:   Future    Standing Expiration Date:   09/26/2021    Order Specific Question:   Is patient pregnant?    Answer:   No    Order Specific Question:   Preferred imaging location?    Answer:   [id number]    Requested Prescriptions   Signed Prescriptions Disp Refills  . doxycycline (VIBRA-TABS) 100 MG tablet 20 tablet 0    Sig: Take 1 tablet (100 mg total) by mouth 2 (two) times daily.

## 2020-09-30 NOTE — Progress Notes (Signed)
Cardiology Office Note:    Date:  10/01/2020   ID:  Bailey Greene, DOB 1960/09/08, MRN [id number]  PCP:  [name], NP   [place]  Cardiologist:  Minus Breeding, MD  Advanced Practice Provider:  Warren Lacy, QG-[id number]}   Referring MD: [name], NP   Reason for visit: Routine follow-up  History of Present Illness:    Bailey Greene is a 60 y.o. female with a history of CAD s/p CABG x5 (LIMA-LAD, RIMA-Diag, sequential SVG-OM1-OM2, right radial-PDA) in 2006 with cardiac cath in 2008 showing patent grafts and multiple negative stress tests since then most recently in 07/2017, hypertension, dyslipidemia, type 2 diabetes mellitus, migraine, bipolar affective disorder, and anxiety who is followed by Dr. Percival Spanish.  Patient has a long-standing history of sharp chest pain in the setting of emotional stress. Most recent Echo in 07/2017 showed LVEF of 55-60% with normal wall motion and grade 1 diastolic dysfunction. Most recent ischemic evaluation was a Myoview in 07/2017 which was low risk with no evidence of ischemia.  She was hospitalized in 07/2020 for chest pain and was found to have acute PE (started on Eliquis) and diagnosed with DM with A1C 13.7%.  She comes in today alone.  She states she is doing well from a cardiovascular standpoint.  No recent chest pain or palpitations.  Both have improved since January.  She states her anginal equivalent is left arm tingling which she has not had recently.  Her biggest complaint is fatigue which she blames on her DM/ new DM medications.  She is working on her diet/exercise.  Relating to her recent PE, she denies DOE and bleeding issues with Xarelto.  Otherwise, she denies lightheadedess, syncope, orthopnea, PND or significant pedal edema.  Past Medical History:  Diagnosis Date  . Anxiety   . Bipolar affective disorder ([place])   . CAD (coronary artery disease) 2006   CABG w/ LIMA-LAD,  RIMA-Diag, SVG-OM1-OM2, R radial-PDA  . 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   Qualifier: Diagnosis of  By: Garen Grams    . Diabetes mellitus type 2 in nonobese ([place]) 07/2016  . Dyslipidemia   . Headache(784.0)   . HTN (hypertension)   . Migraine     Past Surgical History:  Procedure Laterality Date  . BUNIONECTOMY  08/2011   right foot  . CARDIAC CATHETERIZATION  2007   severe native 3 v dz, all grafts patent (LIMA-LAD, RIMA-Diag, SVG-OM1-OM2, R radial-PDA)  . CESAREAN SECTION  1984  . CORONARY ARTERY BYPASS GRAFT  2006    Coronary artery bypass grafting x5 with a left  internal  mammary to the left anterior descending coronary artery.  Free right  internal mammary to the diagonal coronary artery, sequential reverse  saphenous vein graft to the first and second obtuse marginal, right  artery bypass to the posterior descending coronary artery with endo-vein harvesting.    Current Medications: Current Meds  Medication Sig  . Accu-Chek Softclix Lancets lancets USE UP TO FOUR TIMES DAILY AS DIRECTED  . ALPRAZolam (XANAX) 0.5 MG tablet TAKE 2 TABLETS BY MOUTH TWICE A DAY AS NEEDED FOR ANXIETY  . blood glucose meter kit and supplies KIT Dispense based on patient and insurance preference. Use up to four times daily as directed. (FOR ICD-9 250.00, 250.01).  . busPIRone (BUSPAR) 30 MG tablet TAKE 1 TABLET BY MOUTH TWICE A DAY  . divalproex (DEPAKOTE ER) 500 MG 24 hr tablet Take 3 tablets (1,500 mg total) by mouth at bedtime.  Marland Kitchen  doxycycline (VIBRA-TABS) 100 MG tablet Take 1 tablet (100 mg total) by mouth 2 (two) times daily.  . Insulin Glargine (BASAGLAR KWIKPEN) 100 UNIT/ML Inject 12 Units into the skin daily.  . insulin glargine (LANTUS) 100 UNIT/ML Solostar Pen INJECT 10 UNITS INTO THE SKIN DAILY.  Marland Kitchen Insulin Pen Needle (PEN NEEDLES) 33G X 4 MM MISC by Does not apply route.  . metoprolol succinate (TOPROL-XL) 50 MG 24 hr tablet Take 1 tablet (50 mg total) by mouth daily.  . nitroGLYCERIN  (NITROSTAT) 0.4 MG SL tablet Place 1 tablet (0.4 mg total) under the tongue every 5 (five) minutes as needed for chest pain.  Marland Kitchen PARoxetine (PAXIL) 20 MG tablet TAKE 2 TABLETS BY MOUTH EVERY DAY  . QUEtiapine (SEROQUEL) 25 MG tablet TAKE 1-2 TABLETS (25-50 MG TOTAL) BY MOUTH AT BEDTIME.  . rivaroxaban (XARELTO) 20 MG TABS tablet Take 1 tablet (20 mg total) by mouth daily with supper.  . simvastatin (ZOCOR) 80 MG tablet Take 80 mg by mouth daily. 1 Tablet at Bedtime  . sitaGLIPtin (JANUVIA) 25 MG tablet Take 1 tablet (25 mg total) by mouth daily.  Marland Kitchen XIIDRA 5 % SOLN Place 1 drop into both eyes in the morning and at bedtime.     Allergies:   Patient has no known allergies.   Social History   Socioeconomic History  . Marital status: Legally Separated    Spouse name: Not on file  . Number of children: Not on file  . Years of education: Not on file  . Highest education level: Not on file  Occupational History  . Occupation: Insurance claims handler  Tobacco Use  . Smoking status: Current Every Day Smoker    Packs/day: 0.50    Years: 25.00    Pack years: 12.50    Types: Cigarettes  . Smokeless tobacco: Never Used  Vaping Use  . Vaping Use: Never used  Substance and Sexual Activity  . Alcohol use: No    Alcohol/week: 0.0 standard drinks  . Drug use: Never  . Sexual activity: Yes    Partners: Male    Comment: married  Other Topics Concern  . Not on file  Social History Narrative   Regular exercise:  3 days weekly   Caffeine Use:  1 soda daily   One child biological daughter born in 28 and an adopted niece.   [place] specialist   Married- may be divorcing.          Social Determinants of Health   Financial Resource Strain: Not on file  Food Insecurity: Not on file  Transportation Needs: Not on file  Physical Activity: Not on file  Stress: Not on file  Social Connections: Not on file     Family History: The patient's family history includes Diabetes  in her father and paternal grandmother; Heart attack in her father; Hypertension in her father and paternal grandmother; Lupus in her mother. There is no history of Stroke, Kidney disease, Hyperlipidemia, or Sudden death.  ROS:   Please see the history of present illness.     EKGs/Labs/Other Studies Reviewed:    EKG:  EKG is not ordered today.    Recent Labs: 07/08/2020: TSH 0.254 07/09/2020: Hemoglobin 13.8; Platelets 161 07/17/2020: ALT 14 07/23/2020: BUN 7; Creatinine, Ser 0.75; Magnesium 2.0; Potassium 4.5; Sodium 141  Recent Lipid Panel    Component Value Date/Time   CHOL 139 07/17/2020 1009   TRIG 80 07/17/2020 1009   HDL 58 07/17/2020 1009  CHOLHDL 2.4 07/17/2020 1009   CHOLHDL 3 08/17/2016 1225   VLDL 22.8 08/17/2016 1225   LDLCALC 65 07/17/2020 1009   LDLDIRECT 145.2 07/18/2008 0821     Risk Assessment/Calculations:       Physical Exam:    VS:  BP 132/84   Pulse 79   Ht '5\' 5"'  (1.651 m)   Wt 134 lb (60.8 kg)   LMP 11/28/2009   SpO2 99%   BMI 22.30 kg/m     Wt Readings from Last 3 Encounters:  10/01/20 134 lb (60.8 kg)  09/26/20 138 lb (62.6 kg)  08/04/20 139 lb (63 kg)     GEN:  Well nourished, well developed in no acute distress HEENT: Normal NECK: No JVD; No carotid bruits LYMPHATICS: No lymphadenopathy CARDIAC: RRR, no murmurs, rubs, gallops RESPIRATORY:  Clear to auscultation without rales, wheezing or rhonchi  ABDOMEN: Soft, non-tender, non-distended MUSCULOSKELETAL:  No edema; No deformity  SKIN: Warm and dry NEUROLOGIC:  Alert and oriented x 3 PSYCHIATRIC:  Normal affect   ASSESSMENT AND PLAN   Palpitations, resolved - Toprol increased to 53m at 07/2020 appt; continue current dose. - Zio patch 07/2020 showed 1 short run of NSVT & 1 short run of SVT (both <15 beats).  2D echo with normal LV/RV & no significant valve disease.  Chest Pain, resolved History of CAD s/p CABG - History of CABG x5 in 2006 with cardiac cath in 2007 showing patent  grafts. She has had multiple stress test since then which have been negative most recently in 07/2017.  - Continue statin.  No ASA needed while on Eliquis.  Recent PE - Diagnosed with PE on 07/08/2020. Plan is for anticoagulation for at least 3 months. - Management per PCP.  Dyslipidemia, well controlled - Recent lipid panel 07/17/2020: Total Cholesterol 139, Triglycerides 80, HDL 58, LDL 65. - LDL goal <70 given CAD. - Continue Simvastatin 844mdaily.  Type 2 Diabetes Mellitus, poorly controlled. - Hemoglobin A1c of 13.8 on 07/08/2020.  Currently her biggest cardiac risk factor. - Management per PCP.     Medication Adjustments/Labs and Tests Ordered: Current medicines are reviewed at length with the patient today.  Concerns regarding medicines are outlined above.  No orders of the defined types were placed in this encounter.  No orders of the defined types were placed in this encounter.   There are no Patient Instructions on file for this visit.   Signed, JeWarren LacyPA-C  10/01/2020 11:49 AM    [facility]

## 2020-10-01 ENCOUNTER — Other Ambulatory Visit: Payer: Self-pay

## 2020-10-01 ENCOUNTER — Ambulatory Visit (INDEPENDENT_AMBULATORY_CARE_PROVIDER_SITE_OTHER): Payer: 59 | Admitting: Physician Assistant

## 2020-10-01 ENCOUNTER — Encounter: Payer: Self-pay | Admitting: Medical

## 2020-10-01 VITALS — BP 132/84 | HR 79 | Ht 65.0 in | Wt 134.0 lb

## 2020-10-01 DIAGNOSIS — Z951 Presence of aortocoronary bypass graft: Secondary | ICD-10-CM | POA: Diagnosis not present

## 2020-10-01 DIAGNOSIS — E785 Hyperlipidemia, unspecified: Secondary | ICD-10-CM | POA: Diagnosis not present

## 2020-10-01 DIAGNOSIS — I2510 Atherosclerotic heart disease of native coronary artery without angina pectoris: Secondary | ICD-10-CM | POA: Diagnosis not present

## 2020-10-01 DIAGNOSIS — R002 Palpitations: Secondary | ICD-10-CM

## 2020-10-01 NOTE — Patient Instructions (Signed)
Medication Instructions:  No Changes *If you need a refill on your cardiac medications before your next appointment, please call your pharmacy*   Lab Work: No labs  If you have labs (blood work) drawn today and your tests are completely normal, you will receive your results only by: Marland Kitchen MyChart Message (if you have MyChart) OR . A paper copy in the mail If you have any lab test that is abnormal or we need to change your treatment, we will call you to review the results.   Testing/Procedures: No Testing   Follow-Up: At [facility], you and your health needs are our priority.  As part of our continuing mission to provide you with exceptional heart care, we have created designated Provider Care Teams.  These Care Teams include your primary Cardiologist (physician) and Advanced Practice Providers (APPs -  Physician Assistants and Nurse Practitioners) who all work together to provide you with the care you need, when you need it.  Your next appointment:   6 month(s)  The format for your next appointment:   In Person  Provider:   Juanda Crumble PA-C

## 2020-10-02 ENCOUNTER — Ambulatory Visit: Payer: 59 | Admitting: Student

## 2020-10-06 ENCOUNTER — Telehealth: Payer: Self-pay

## 2020-10-06 MED ORDER — PEN NEEDLES 33G X 4 MM MISC: 1.0000 [IU] | Freq: Every day | 1 refills | Status: DC

## 2020-10-06 NOTE — Telephone Encounter (Signed)
Pt called stating she needs PCP to order needles:  Insulin Pen Needle (PEN NEEDLES) 33G X 4 MM MISC.  She got the initial needles from the ER back in February and is just now depleting her supply from that initial supply.  Please send needles to [facility] on [address].

## 2020-10-06 NOTE — Telephone Encounter (Signed)
I have called pt to follow up on her insurance information. Pt was supposed to get a Head CT done and since her current insurance is not valid we are unable to start the PA for it.   Pt stated that her Headaches are better, but she still needs to get it done since the headaches are still there.   Pt stated that she did not call her insurance yet but she will call today. I stated understanding and informed the patient that the ball is in her court at this time.   She stated understanding and will find time to do it today.   FYI to provider and Gwen.

## 2020-10-06 NOTE — Telephone Encounter (Signed)
Prescription was sent

## 2020-10-07 NOTE — Telephone Encounter (Signed)
Noted  

## 2020-10-08 ENCOUNTER — Other Ambulatory Visit: Payer: Self-pay | Admitting: Psychiatry

## 2020-10-08 DIAGNOSIS — F5105 Insomnia due to other mental disorder: Secondary | ICD-10-CM

## 2020-10-10 NOTE — Telephone Encounter (Signed)
Please review

## 2020-10-13 ENCOUNTER — Telehealth: Payer: Self-pay | Admitting: Family

## 2020-10-13 MED ORDER — BASAGLAR KWIKPEN 100 UNIT/ML ~~LOC~~ SOPN: 12.0000 [IU] | PEN_INJECTOR | Freq: Every day | SUBCUTANEOUS | 3 refills | Status: DC

## 2020-10-13 NOTE — Telephone Encounter (Signed)
Rx sent 

## 2020-10-13 NOTE — Telephone Encounter (Signed)
Pt wants to confirm that her pharmacy sent the request for her refill for Basgular. States she called them and they informed her that they sent it to her.

## 2020-10-17 ENCOUNTER — Ambulatory Visit
Admission: RE | Admit: 2020-10-17 | Discharge: 2020-10-17 | Disposition: A | Discharge status: Home / Self Care | Payer: 59 | Source: Ambulatory Visit | Attending: Family | Admitting: Family

## 2020-10-17 ENCOUNTER — Other Ambulatory Visit: Payer: Self-pay

## 2020-10-17 DIAGNOSIS — R519 Headache, unspecified: Secondary | ICD-10-CM

## 2020-10-17 DIAGNOSIS — G8929 Other chronic pain: Secondary | ICD-10-CM

## 2020-10-24 ENCOUNTER — Encounter: Payer: Self-pay | Admitting: Family

## 2020-10-29 ENCOUNTER — Ambulatory Visit (INDEPENDENT_AMBULATORY_CARE_PROVIDER_SITE_OTHER): Payer: 59 | Admitting: Family

## 2020-10-29 ENCOUNTER — Encounter: Payer: Self-pay | Admitting: Family

## 2020-10-29 ENCOUNTER — Other Ambulatory Visit: Payer: Self-pay

## 2020-10-29 VITALS — BP 122/80 | HR 86 | Temp 98.7°F | Resp 16 | Ht 65.0 in | Wt 137.0 lb

## 2020-10-29 DIAGNOSIS — Z1159 Encounter for screening for other viral diseases: Secondary | ICD-10-CM | POA: Diagnosis not present

## 2020-10-29 DIAGNOSIS — K429 Umbilical hernia without obstruction or gangrene: Secondary | ICD-10-CM | POA: Insufficient documentation

## 2020-10-29 DIAGNOSIS — E119 Type 2 diabetes mellitus without complications: Secondary | ICD-10-CM | POA: Diagnosis not present

## 2020-10-29 DIAGNOSIS — E785 Hyperlipidemia, unspecified: Secondary | ICD-10-CM

## 2020-10-29 DIAGNOSIS — E0590 Thyrotoxicosis, unspecified without thyrotoxic crisis or storm: Secondary | ICD-10-CM

## 2020-10-29 DIAGNOSIS — F319 Bipolar disorder, unspecified: Secondary | ICD-10-CM | POA: Diagnosis not present

## 2020-10-29 DIAGNOSIS — I25119 Atherosclerotic heart disease of native coronary artery with unspecified angina pectoris: Secondary | ICD-10-CM | POA: Diagnosis not present

## 2020-10-29 DIAGNOSIS — Z23 Encounter for immunization: Secondary | ICD-10-CM | POA: Diagnosis not present

## 2020-10-29 DIAGNOSIS — I10 Essential (primary) hypertension: Secondary | ICD-10-CM

## 2020-10-29 LAB — HEMOGLOBIN A1C: Hgb A1c MFr Bld: 7.8 % — ABNORMAL HIGH (ref 4.6–6.5)

## 2020-10-29 LAB — TSH: TSH: 0.79 u[IU]/mL (ref 0.35–4.50)

## 2020-10-29 LAB — COMPREHENSIVE METABOLIC PANEL
ALT: 9 U/L (ref 0–35)
AST: 12 U/L (ref 0–37)
Albumin: 4.5 g/dL (ref 3.5–5.2)
Alkaline Phosphatase: 95 U/L (ref 39–117)
BUN: 13 mg/dL (ref 6–23)
CO2: 29 mEq/L (ref 19–32)
Calcium: 9.9 mg/dL (ref 8.4–10.5)
Chloride: 104 mEq/L (ref 96–112)
Creatinine, Ser: 0.86 mg/dL (ref 0.40–1.20)
GFR: 74.73 mL/min (ref >60.00)
Glucose, Bld: 124 mg/dL — ABNORMAL HIGH (ref 70–99)
Potassium: 4.1 mEq/L (ref 3.5–5.1)
Sodium: 140 mEq/L (ref 135–145)
Total Bilirubin: 0.5 mg/dL (ref 0.2–1.2)
Total Protein: 6.6 g/dL (ref 6.0–8.3)

## 2020-10-29 MED ORDER — SITAGLIPTIN PHOSPHATE 50 MG PO TABS: 50.0000 mg | ORAL_TABLET | Freq: Every day | ORAL | 5 refills | Status: DC

## 2020-10-29 NOTE — Assessment & Plan Note (Signed)
Reports good control. Continue Lantus 12 units and januvia 25 units.

## 2020-10-29 NOTE — Assessment & Plan Note (Signed)
Clinically stable. Continues to follow with cardiology.

## 2020-10-29 NOTE — Assessment & Plan Note (Signed)
Lipids at goal. Continue atorvastatin 80mg .

## 2020-10-29 NOTE — Patient Instructions (Signed)
Please complete lab work prior to leaving.   

## 2020-10-29 NOTE — Assessment & Plan Note (Signed)
BP stable. Continue toprol xl 50mg .

## 2020-10-29 NOTE — Progress Notes (Signed)
Subjective:   By signing my name below, I, Shehryar Baig, attest that this documentation has been prepared under the direction and in the presence of [name]. 10/29/2020      Patient ID: Bailey Greene, female    DOB: 04-01-1961, 60 y.o.   MRN: [id number]  Chief Complaint  Patient presents with  . Diabetes    Here for follow up  . Hypertension    Here for follow up  . Abdominal Pain    Possible umbilical hernia    HPI    Patient is in today for an office visit. She complains of a painful hernia near her umbilical. She also has nausea and occasional constipation. She also complains of callus on her right foot. She typically wears crocs and sandals.  Diabetes- She does not see an endocrinologist to manage her diabetes. She measures her blood sugar at home and usually gets 20. She continues taking 25 mg januvia daily PO to manage her sugar levels. Hypertension- She is managing her blood pressure well at this time. She continues taking 50 mg metoprolol succinate to manage her hypertension. She continues to see a cardiologist. Vision- She is UTD on vision care. Immunization- She is interested in getting a hepatitis C screening. She is interested in getting the shingles vaccine. Colonoscopy- She has not completed a colonoscopy yet. She has recently used a cologuard and tested negative.    Past Medical History:  Diagnosis Date  . Anxiety   . Bipolar affective disorder ([place])   . CAD (coronary artery disease) 2006   CABG w/ LIMA-LAD, RIMA-Diag, SVG-OM1-OM2, R radial-PDA  . 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   Qualifier: Diagnosis of  By: Garen Grams    . Diabetes mellitus type 2 in nonobese ([place]) 07/2016  . Dyslipidemia   . Headache(784.0)   . HTN (hypertension)   . Migraine     Past Surgical History:  Procedure Laterality Date  . BUNIONECTOMY  08/2011   right foot  . CARDIAC CATHETERIZATION  2007   severe native 3 v dz, all grafts patent (LIMA-LAD, RIMA-Diag,  SVG-OM1-OM2, R radial-PDA)  . CESAREAN SECTION  1984  . CORONARY ARTERY BYPASS GRAFT  2006    Coronary artery bypass grafting x5 with a left  internal  mammary to the left anterior descending coronary artery.  Free right  internal mammary to the diagonal coronary artery, sequential reverse  saphenous vein graft to the first and second obtuse marginal, right  artery bypass to the posterior descending coronary artery with endo-vein harvesting.    Family History  Problem Relation Age of Onset  . Lupus Mother   . Heart attack Father   . Diabetes Father   . Hypertension Father   . Diabetes Paternal Grandmother   . Hypertension Paternal Grandmother   . Stroke Neg Hx   . Kidney disease Neg Hx   . Hyperlipidemia Neg Hx   . Sudden death Neg Hx     Social History   Socioeconomic History  . Marital status: Legally Separated    Spouse name: Not on file  . Number of children: Not on file  . Years of education: Not on file  . Highest education level: Not on file  Occupational History  . Occupation: Insurance claims handler  Tobacco Use  . Smoking status: Current Every Day Smoker    Packs/day: 0.50    Years: 25.00    Pack years: 12.50    Types: Cigarettes  . Smokeless tobacco: Never Used  Vaping  Use  . Vaping Use: Never used  Substance and Sexual Activity  . Alcohol use: No    Alcohol/week: 0.0 standard drinks  . Drug use: Never  . Sexual activity: Yes    Partners: Male    Comment: married  Other Topics Concern  . Not on file  Social History Narrative   Regular exercise:  3 days weekly   Caffeine Use:  1 soda daily   One child biological daughter born in 33 and an adopted niece.   [place] specialist   Married- may be divorcing.          Social Determinants of Health   Financial Resource Strain: Not on file  Food Insecurity: Not on file  Transportation Needs: Not on file  Physical Activity: Not on file  Stress: Not on file  Social Connections: Not  on file  Intimate Partner Violence: Not on file    Outpatient Medications Prior to Visit  Medication Sig Dispense Refill  . Accu-Chek Softclix Lancets lancets USE UP TO FOUR TIMES DAILY AS DIRECTED 200 each 12  . ALPRAZolam (XANAX) 0.5 MG tablet TAKE 2 TABLETS BY MOUTH TWICE A DAY AS NEEDED FOR ANXIETY 60 tablet 2  . blood glucose meter kit and supplies KIT Dispense based on patient and insurance preference. Use up to four times daily as directed. (FOR ICD-9 250.00, 250.01). 1 each 0  . busPIRone (BUSPAR) 30 MG tablet TAKE 1 TABLET BY MOUTH TWICE A DAY 180 tablet 0  . divalproex (DEPAKOTE ER) 500 MG 24 hr tablet Take 3 tablets (1,500 mg total) by mouth at bedtime. 270 tablet 0  . doxycycline (VIBRA-TABS) 100 MG tablet Take 1 tablet (100 mg total) by mouth 2 (two) times daily. 20 tablet 0  . Insulin Glargine (BASAGLAR KWIKPEN) 100 UNIT/ML Inject 12 Units into the skin daily. 9 mL 3  . insulin glargine (LANTUS) 100 UNIT/ML Solostar Pen INJECT 10 UNITS INTO THE SKIN DAILY. 9 mL 0  . Insulin Pen Needle (PEN NEEDLES) 33G X 4 MM MISC 1 Units by Does not apply route daily. 100 each 1  . metoprolol succinate (TOPROL-XL) 50 MG 24 hr tablet Take 1 tablet (50 mg total) by mouth daily. 90 tablet 3  . nitroGLYCERIN (NITROSTAT) 0.4 MG SL tablet Place 1 tablet (0.4 mg total) under the tongue every 5 (five) minutes as needed for chest pain. 25 tablet 3  . PARoxetine (PAXIL) 20 MG tablet TAKE 2 TABLETS BY MOUTH EVERY DAY 180 tablet 0  . QUEtiapine (SEROQUEL) 25 MG tablet TAKE 1-2 TABLETS (25-50 MG TOTAL) BY MOUTH AT BEDTIME. 180 tablet 0  . rivaroxaban (XARELTO) 20 MG TABS tablet Take 1 tablet (20 mg total) by mouth daily with supper. 30 tablet 3  . simvastatin (ZOCOR) 80 MG tablet Take 80 mg by mouth daily. 1 Tablet at Bedtime    . XIIDRA 5 % SOLN Place 1 drop into both eyes in the morning and at bedtime.    . sitaGLIPtin (JANUVIA) 25 MG tablet Take 1 tablet (25 mg total) by mouth daily. 90 tablet 1   No  facility-administered medications prior to visit.    No Known Allergies  Review of Systems  Gastrointestinal: Positive for abdominal pain, constipation and nausea.       Objective:    Physical Exam Constitutional:      General: She is not in acute distress.    Appearance: Normal appearance. She is not ill-appearing.  HENT:  Head: Normocephalic and atraumatic.     Right Ear: External ear normal.     Left Ear: External ear normal.  Eyes:     Extraocular Movements: Extraocular movements intact.     Pupils: Pupils are equal, round, and reactive to light.  Cardiovascular:     Rate and Rhythm: Normal rate and regular rhythm.     Pulses: Normal pulses.     Heart sounds: Normal heart sounds. No murmur heard. No gallop.   Pulmonary:     Effort: Pulmonary effort is normal. No respiratory distress.     Breath sounds: Normal breath sounds. No wheezing, rhonchi or rales.  Abdominal:     Comments: Small reducible periumbilical hernia  Feet:     Right foot:     Skin integrity: Callus (Heavy callus on right lateral foot) present.  Skin:    General: Skin is warm and dry.  Neurological:     Mental Status: She is alert and oriented to person, place, and time.  Psychiatric:        Behavior: Behavior normal.     BP 122/80 (BP Location: Right Arm, Patient Position: Sitting, Cuff Size: Small)   Pulse 86   Temp 98.7 F (37.1 C) (Oral)   Resp 16   Ht _0  (1.651 m)   Wt 137 lb (62.1 kg)   LMP 11/28/2009   SpO2 100%   BMI 22.80 kg/m  Wt Readings from Last 3 Encounters:  10/29/20 137 lb (62.1 kg)  10/01/20 134 lb (60.8 kg)  09/26/20 138 lb (62.6 kg)    Diabetic Foot Exam - Simple   Simple Foot Form Diabetic Foot exam was performed with the following findings: Yes 10/29/2020 10:08 AM  Visual Inspection No deformities, no ulcerations, no other skin breakdown bilaterally: Yes Sensation Testing Intact to touch and monofilament testing bilaterally: Yes Pulse Check Posterior  Tibialis and Dorsalis pulse intact bilaterally: Yes Comments Some hyperpigmented callouses noted right lateral foot    Lab Results  Component Value Date   WBC 11.3 (H) 07/09/2020   HGB 13.8 07/09/2020   HCT 40.6 07/09/2020   PLT 161 07/09/2020   GLUCOSE 124 (H) 10/29/2020   CHOL 139 07/17/2020   TRIG 80 07/17/2020   HDL 58 07/17/2020   LDLDIRECT 145.2 07/18/2008   LDLCALC 65 07/17/2020   ALT 9 10/29/2020   AST 12 10/29/2020   NA 140 10/29/2020   K 4.1 10/29/2020   CL 104 10/29/2020   CREATININE 0.86 10/29/2020   BUN 13 10/29/2020   CO2 29 10/29/2020   TSH 0.79 10/29/2020   HGBA1C 7.8 (H) 10/29/2020   MICROALBUR 0.4 07/18/2020    Lab Results  Component Value Date   TSH 0.79 10/29/2020   Lab Results  Component Value Date   WBC 11.3 (H) 07/09/2020   HGB 13.8 07/09/2020   HCT 40.6 07/09/2020   MCV 85.3 07/09/2020   PLT 161 07/09/2020   Lab Results  Component Value Date   NA 140 10/29/2020   K 4.1 10/29/2020   CO2 29 10/29/2020   GLUCOSE 124 (H) 10/29/2020   BUN 13 10/29/2020   CREATININE 0.86 10/29/2020   BILITOT 0.5 10/29/2020   ALKPHOS 95 10/29/2020   AST 12 10/29/2020   ALT 9 10/29/2020   PROT 6.6 10/29/2020   ALBUMIN 4.5 10/29/2020   CALCIUM 9.9 10/29/2020   ANIONGAP 9 07/09/2020   GFR 74.73 10/29/2020   Lab Results  Component Value Date   CHOL 139 07/17/2020  Lab Results  Component Value Date   HDL 58 07/17/2020   Lab Results  Component Value Date   LDLCALC 65 07/17/2020   Lab Results  Component Value Date   TRIG 80 07/17/2020   Lab Results  Component Value Date   CHOLHDL 2.4 07/17/2020   Lab Results  Component Value Date   HGBA1C 7.8 (H) 10/29/2020       Assessment & Plan:   Problem List Items Addressed This Visit      Unprioritized   Periumbilical hernia    New. Reducible. Discussed signs/symptoms of incarcerated hernia and she is advised to go to the ED if this occurs. Will refer to surgeon to discuss repair.        Relevant Orders   Ambulatory referral to General Surgery   Hyperlipidemia with target LDL less than 70    Lipids at goal. Continue atorvastatin 6m.       HTN (hypertension)    BP stable. Continue toprol xl 558m        [facility]    Reports good control. Continue Lantus 12 units and januvia 25 units.       Relevant Orders   Comp Met (CMET) (Completed)   Hemoglobin A1c (Completed)   Coronary artery disease involving native coronary artery of native heart with angina pectoris [place]   Clinically stable. Continues to follow with cardiology.       Bipolar affective disorder ([place]   She continues to have anxiety- she is followed by psychiatry. She is requesting an extension to continue to work from home as going into the office causes her severe anxeity.        Other Visit Diagnoses    Need for hepatitis C screening test       Relevant Orders   Hepatitis C Antibody   Hyperthyroidism       Relevant Orders   TSH (Completed)   Need for shingles vaccine       Relevant Orders   Varicella-zoster vaccine IM (Shingrix) (Completed)       No orders of the defined types were placed in this encounter.   I, MeDebbrah AlarP, personally preformed the services described in this documentation.  All medical record entries made by the scribe were at my direction and in my presence.  I have reviewed the chart and discharge instructions (if applicable) and agree that the record reflects my personal performance and is accurate and complete. 10/29/2020   I,Shehryar Baig,acting as a scEducation administratoror MeNance PearNP.,have documented all relevant documentation on the behalf of MeNance PearNP,as directed by  MeNance PearNP while in the presence of MeNance PearNP.   MeNance PearNP

## 2020-10-29 NOTE — Assessment & Plan Note (Signed)
She continues to have anxiety- she is followed by psychiatry. She is requesting an extension to continue to work from home as going into the office causes her severe anxeity.

## 2020-10-29 NOTE — Assessment & Plan Note (Signed)
New. Reducible. Discussed signs/symptoms of incarcerated hernia and she is advised to go to the ED if this occurs. Will refer to surgeon to discuss repair.

## 2020-10-30 LAB — HEPATITIS C ANTIBODY
Hepatitis C Ab: NONREACTIVE
SIGNAL TO CUT-OFF: 0 (ref <1.00)

## 2020-10-31 ENCOUNTER — Telehealth: Payer: Self-pay | Admitting: Psychiatry

## 2020-10-31 NOTE — Telephone Encounter (Signed)
Received fax from [facility] Claims [employer] Services regarding Bailey Greene. Completion needed for Health Care Provider Certification. Placed on Traci's desk.

## 2020-11-04 DIAGNOSIS — Z0289 Encounter for other administrative examinations: Secondary | ICD-10-CM

## 2020-11-06 ENCOUNTER — Telehealth: Payer: Self-pay | Admitting: Psychiatry

## 2020-11-06 NOTE — Telephone Encounter (Signed)
I believe that did not go through today twice, please double check.

## 2020-11-06 NOTE — Telephone Encounter (Signed)
Faxed annual renewal to [place] to [phone]. Pt was informed.

## 2020-11-07 NOTE — Telephone Encounter (Signed)
Faxed to [place] again to [phone]

## 2020-11-07 NOTE — Telephone Encounter (Signed)
Thanks Tammy 

## 2020-12-02 ENCOUNTER — Other Ambulatory Visit: Payer: Self-pay | Admitting: Psychiatry

## 2020-12-02 DIAGNOSIS — F411 Generalized anxiety disorder: Secondary | ICD-10-CM

## 2020-12-02 DIAGNOSIS — F4001 Agoraphobia with panic disorder: Secondary | ICD-10-CM

## 2020-12-05 ENCOUNTER — Ambulatory Visit: Payer: Self-pay | Admitting: Surgery

## 2020-12-05 ENCOUNTER — Telehealth: Payer: Self-pay | Admitting: *Deleted

## 2020-12-05 NOTE — Telephone Encounter (Signed)
   [facility] Pre-operative Risk Assessment    Patient Name: Bailey Greene  DOB: 1960/07/13 MRN: [id number]    Request for surgical clearance:  What type of surgery is being performed? Open umbilical hernia repair surgery  When is this surgery scheduled? tbd  What type of clearance is required (medical clearance vs. Pharmacy clearance to hold med vs. Both)? both  Are there any medications that need to be held prior to surgery and how long? Xarelto 20 mg   Practice name and name of physician performing surgery? [facility]  ( [place]) ;Dr Michaelle Birks  What is the office phone number? [phone]   7.   What is the office fax number? [phone]  attn; Raven Sneed,CMA  8.   Anesthesia type (None, local, MAC, general) ? general   Bailey Greene 12/05/2020, 5:43 PM  _________________________________________________________________   (provider comments below)

## 2020-12-05 NOTE — H&P (Signed)
REFERRING PHYSICIAN:  Lemont Fillers, *   PROVIDER:  Jeoffrey Massed, MD   MRN: [id number] DOB: 07-27-1960 DATE OF ENCOUNTER: 12/05/2020   Subjective   Chief Complaint: Hernia       History of Present Illness: Bailey Greene is a 60 y.o. female who is seen today as an office consultation at the request of Sandford Craze for evaluation of a hernia.  She first noticed a hernia in the umbilical area about a year ago. It was initially very small but has gotten larger. It now causes her frequent pain and discomfort, especially with lifting and other activities.    She has a history of CAD and underwent a CABG in 2006. She recently saw her cardiologist in May, and her last echo in March 2022 showed normal ventricular function. She was diagnosed with a PE in February 2022 (segmental PE with low thrombus burden) and is on Xarelto, which is managed by her PCP.     Review of Systems: A complete review of systems was obtained from the patient.  I have reviewed this information and discussed as appropriate with the patient.  See HPI as well for other ROS.       Medical History: Past Medical History Past Medical History: Diagnosis Date  Anxiety    Bipolar disorder (CMS-HCC)    CAD (coronary artery disease)    Diabetes mellitus type 2 in nonobese (CMS-HCC)    Hyperlipidemia    Hypertension    Migraine        Patient Active Problem List Diagnosis  Umbilical hernia     Past Surgical History Past Surgical History: Procedure Laterality Date  CESAREAN SECTION   1984  CORONARY ARTERY BYPASS W/VENOUS & ARTERIAL GRAFTS   2006  WEIGHTLOSS N/A        Allergies No Known Allergies    Current Outpatient Medications on File Prior to Visit Medication Sig Dispense Refill  ACCU-CHEK GUIDE TEST STRIPS test strip USE UP TO FOUR TIMES DAILY AS DIRECTED      ALPRAZolam (XANAX) 0.5 MG tablet TAKE 2 TABLETS BY MOUTH TWICE A DAY AS NEEDED FOR ANXIETY      busPIRone (BUSPAR) 30 MG  tablet        JANUVIA 50 mg tablet        metoprolol succinate (TOPROL-XL) 25 MG XL tablet Take 25 mg by mouth once daily      XIIDRA 5 % ophthalmic solution         No current facility-administered medications on file prior to visit.     Family History Family History Problem Relation Age of Onset  Diabetes Father    Myocardial Infarction (Heart attack) Father    Hyperlipidemia (Elevated cholesterol) Father    High blood pressure (Hypertension) Father        Social History   Tobacco Use Smoking Status Current Every Day Smoker  Types: Cigarettes Smokeless Tobacco Never Used     Social History Social History    Socioeconomic History  Marital status: Unknown Tobacco Use  Smoking status: Current Every Day Smoker     Types: Cigarettes  Smokeless tobacco: Never Used Substance and Sexual Activity  Alcohol use: Never  Drug use: Never      Objective:     Vitals:   12/05/20 1432 BP: 138/78 Pulse: 79 Temp: 36.4 C (97.6 F) SpO2: 100% Weight: 64.4 kg (142 lb) Height: 165.1 cm (5\' 5" )   Body mass index is 23.63 kg/m.   Physical Exam Vitals reviewed.  Constitutional:      General: She is not in acute distress.    Appearance: Normal appearance.  HENT:     Head: Normocephalic and atraumatic.  Eyes:     General: No scleral icterus.    Conjunctiva/sclera: Conjunctivae normal.  Cardiovascular:     Rate and Rhythm: Normal rate and regular rhythm.     Heart sounds: No murmur heard. Pulmonary:     Effort: Pulmonary effort is normal. No respiratory distress.     Breath sounds: Normal breath sounds.  Abdominal:     General: Abdomen is flat. There is no distension.     Palpations: Abdomen is soft.     Comments: Small supraumbilical hernia, reducible in the supine position, mild tenderness to palpation but no overlying skin changes.  Musculoskeletal:        General: No deformity. Normal range of motion.     Cervical back: Normal range of motion.  Skin:     General: Skin is warm and dry.     Coloration: Skin is not jaundiced.  Neurological:     General: No focal deficit present.     Mental Status: She is alert and oriented to person, place, and time.  Psychiatric:        Mood and Affect: Mood normal.        Behavior: Behavior normal.        Thought Content: Thought content normal.          Assessment and Plan: Diagnoses and all orders for this visit:   Umbilical hernia without obstruction or gangrene     60 yo female with a symptomatic umbilical hernia. She would like to have this repaired. I discussed the details of an open umbilical hernia repair, including the possibility of mesh placement although I think her hernia is small enough that she will not require mesh. I counseled her on the postoperative lifting restrictions for 6 weeks. She would like to proceed with surgery.   Will need to obtain medical clearance from her cardiologist, although I anticipate no further workup will be needed as she had a recent echo showing normal LV function. Will also request clearance from her PCP to hold Xarelto for 72 hours prior to surgery. As her PE was more than 3 months ago and was very small, she should be able to safely hold anticoagulation without bridging, but will await recommendations from her PCP. Once we have received clearance, she will be scheduled for outpatient surgery.     Dameian Crisman Marrianne Mood, MD

## 2020-12-06 ENCOUNTER — Telehealth: Payer: Self-pay | Admitting: Family

## 2020-12-06 ENCOUNTER — Encounter: Payer: Self-pay | Admitting: Family

## 2020-12-06 NOTE — Telephone Encounter (Signed)
Bailey Greene, please advise pt that I would like her to discontinue her xarelto.     Dr. Freida Busman- FYI.  Thanks.

## 2020-12-08 NOTE — Telephone Encounter (Signed)
   Name: Bailey Greene  DOB: 1961-02-24  MRN: [id number]   Primary Cardiologist: Rollene Rotunda, MD  Chart reviewed as part of pre-operative protocol coverage. Patient was contacted 12/08/2020 in reference to pre-operative risk assessment for pending surgery as outlined below.  Darrien Laakso was last seen on 07/23/20 by Marjie Skiff PA-C.  Since that day, Yuval Nolet has done well.  She can complete more than 4.0 METS without angina. She has a history of CABG and IDDM. If open abdominal procedure, she is at increased risk of cardiovascular complications - 11% risk according to the RCRI. Reassuring stress test in 2019 and echocardiogram 07/2020. I do not think additional testing at this time would mitigate risk. She is aware she is at higher risk for this high risk procedure and agrees to proceed.    Therefore, based on ACC/AHA guidelines, the patient would be at acceptable risk for the planned procedure without further cardiovascular testing.   She take xarelto, although not listed on med list in Epic, for PE diagnosed Feb 2022. Please reach out to her PCP for xarelto hold as they manage this.   The patient was advised that if she develops new symptoms prior to surgery to contact our office to arrange for a follow-up visit, and she verbalized understanding.  I will route this recommendation to the requesting party via Epic fax function and remove from pre-op pool. Please call with questions.  Roe Rutherford Herson Prichard, PA 12/08/2020, 10:34 AM

## 2020-12-08 NOTE — Telephone Encounter (Signed)
Pt aware.

## 2020-12-10 ENCOUNTER — Ambulatory Visit (INDEPENDENT_AMBULATORY_CARE_PROVIDER_SITE_OTHER): Payer: 59 | Admitting: Psychiatry

## 2020-12-10 ENCOUNTER — Encounter: Payer: Self-pay | Admitting: Psychiatry

## 2020-12-10 ENCOUNTER — Other Ambulatory Visit: Payer: Self-pay

## 2020-12-10 DIAGNOSIS — F4001 Agoraphobia with panic disorder: Secondary | ICD-10-CM | POA: Diagnosis not present

## 2020-12-10 DIAGNOSIS — F411 Generalized anxiety disorder: Secondary | ICD-10-CM | POA: Diagnosis not present

## 2020-12-10 DIAGNOSIS — F3162 Bipolar disorder, current episode mixed, moderate: Secondary | ICD-10-CM

## 2020-12-10 MED ORDER — ALPRAZOLAM 0.5 MG PO TABS: ORAL_TABLET | ORAL | 2 refills | Status: DC

## 2020-12-10 MED ORDER — OLANZAPINE 10 MG PO TABS: 10.0000 mg | ORAL_TABLET | Freq: Every day | ORAL | 1 refills | Status: DC

## 2020-12-10 NOTE — Progress Notes (Signed)
Bailey Greene [id number] [birth year] 60 y.o.   Subjective:   Patient ID:  Bailey Greene is a 60 y.o. (DOB Jul 01, 1960) female.  Chief Complaint:  Chief Complaint  Patient presents with   Follow-up   Anxiety   Stress   Depression    Anxiety Symptoms include nervous/anxious behavior. Patient reports no decreased concentration, dizziness or nausea.    Bailey Greene presents for follow-up of bipolar disorder and panic attacks and general anxiety.  visit 5/22,2020.  Increased Depakote then to 1500 mg daily.  Boss rec LOA for 4 weeks.  Going through separation is really tough.  Panic attacks at work interfering.  Had this job 10 years.  Likes the job but hard to concentrate and stay focused.  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  Panic incr pulse and SOB, fear, sweating and shakey.  Panic lasts 20 mins and increased frequency.  Several in a day.  Going on for 2 mos but getting worse.  Boss can tell from listening to her calls and drop in production.  At follow up visit November 22, 2018.  Mood swings are better.  Trouble staying asleep.  Caffeine 1 coffee and 1 soda daily.She was still having severe anxiety plus panic.  We discussed the risk of SSRIs trickling triggering mood swings but the severity of the anxiety was such that we decided to initiate fluoxetine 10 mg daily to increase to 20 mg daily.  We also were using low-dose mirtazapine to help with sleep.  seen January 18, 2019.  She was granted medical leave for panic symptoms.  She was switched to paroxetine for panic from fluoxetine.  Since she was switched from mirtazapine to quetiapine 25 to 50 mg nightly for insomnia.   November 2020 visit with the following noted: It is helping some with anxiety but a lot of stress.  No unusual mood swings without a change.   She's satisfied with the 20 mg paroxetine. Sleep is much better with quetiapine and xanax at night.  5 hour and sometimes better depending on work schedule.   No  meds were changed.    10/23/2019 visit with the following noted: Anxiety, dep, panic attacks all worse lately for 2 mos.  Anger also worse mostly just at home bc manages it at work.  Several triggers.  Sister passed March 26 after illness.  Work and daughter are stressful.  Overwhelming. Not as good staying asleep.  Random panic.  Feels SOB.  Wanting to isolate. Spontaneous crying spell.s Poor concentration affecting work.    Plan: Increase paroxetine to 1-1/2 of the 20 mg tablets daily For sleep increase quetiapine to 2 the 25 mg tablets nightly  03/05/20 appt with following noted: Less anxious and sleeping is better.  Panic can be triggered at work with SOB and heart racing.  Happens about 2 times weekly. Main stress is work is overwhelming.  Talk with customers all day.   No clear mania.   Still some depression too. No SE Plan: no med changes except increase paroxetine to 40 for anxiety  09/02/2020 appt noted: Dx DM since here. Anxiety and panic worse since January.  Consistent with meds.  Stress dx DM.  Sister passed away.  Panic with SOB and occ sweats.  Panic daily. Usually before noon usually with trigger. Xanax makes her relax. 1 cup coffee AM.   Sleep 4-5 hours with quetiapine 25 mg.   And pretty consistent. Mood feels labile.  About to lose it including irritable and angry.  Plan: Increase Quetiapine 25 mg 2 mg HS.   12/10/20 appt noted: Dizziniess for awhile after paroxetine resolved. Still having anxiety and panic but not daily.   Average 7/10 anxiety usually triggered with work as primary stress.  She and H still dealing with problems.  Panic worse either in the AM or evening. Sleep is better 4-5 hours nightly. Taking quetiapine 50 mg with Xanax at night. Still on Depakote ER 1500, busipirone 30 BID , paroxetine 40 mg daily No anger problems at work.  Appetite is better.  Regaining some weight. Evening walks helps mental health.   PDMP only shows Xanax.  She denies  abusing substances  Past Psychiatric Medication Trials: Hydroxyzine, mirtazapine poor response for sleep,  Depakote,  Xanax, buspirone,  duloxetine, citalopram, Wellbutrin, fluoxetine, paroxetine  quetiapine low-dose for sleep  Notes and chart were reviewed with the patient regarding prior history and symptoms.  Review of Systems:  Review of Systems  Gastrointestinal:  Negative for diarrhea and nausea.  Endocrine: Negative for polyuria.  Neurological:  Negative for dizziness, tremors and weakness.  Psychiatric/Behavioral:  Positive for dysphoric mood. Negative for decreased concentration and sleep disturbance. The patient is nervous/anxious.    Medications: I have reviewed the patient's current medications.  Current Outpatient Medications  Medication Sig Dispense Refill   Accu-Chek Softclix Lancets lancets USE UP TO FOUR TIMES DAILY AS DIRECTED 200 each 12   blood glucose meter kit and supplies KIT Dispense based on patient and insurance preference. Use up to four times daily as directed. (FOR ICD-9 250.00, 250.01). 1 each 0   busPIRone (BUSPAR) 30 MG tablet TAKE 1 TABLET BY MOUTH TWICE A DAY 180 tablet 0   divalproex (DEPAKOTE ER) 500 MG 24 hr tablet Take 3 tablets (1,500 mg total) by mouth at bedtime. 270 tablet 0   doxycycline (VIBRA-TABS) 100 MG tablet Take 1 tablet (100 mg total) by mouth 2 (two) times daily. 20 tablet 0   Insulin Glargine (BASAGLAR KWIKPEN) 100 UNIT/ML Inject 12 Units into the skin daily. 9 mL 3   insulin glargine (LANTUS) 100 UNIT/ML Solostar Pen INJECT 10 UNITS INTO THE SKIN DAILY. 9 mL 0   Insulin Pen Needle (PEN NEEDLES) 33G X 4 MM MISC 1 Units by Does not apply route daily. 100 each 1   metoprolol succinate (TOPROL-XL) 50 MG 24 hr tablet Take 1 tablet (50 mg total) by mouth daily. 90 tablet 3   OLANZapine (ZYPREXA) 10 MG tablet Take 1 tablet (10 mg total) by mouth at bedtime. 30 tablet 1   PARoxetine (PAXIL) 20 MG tablet TAKE 2 TABLETS BY MOUTH EVERY DAY  180 tablet 0   simvastatin (ZOCOR) 80 MG tablet Take 80 mg by mouth daily. 1 Tablet at Bedtime     sitaGLIPtin (JANUVIA) 50 MG tablet Take 1 tablet (50 mg total) by mouth daily. 30 tablet 5   XIIDRA 5 % SOLN Place 1 drop into both eyes in the morning and at bedtime.     ALPRAZolam (XANAX) 0.5 MG tablet TAKE 2 TABLETS BY MOUTH TWICE A DAY AS NEEDED FOR ANXIETY 60 tablet 2   nitroGLYCERIN (NITROSTAT) 0.4 MG SL tablet Place 1 tablet (0.4 mg total) under the tongue every 5 (five) minutes as needed for chest pain. 25 tablet 3   No current facility-administered medications for this visit.    Medication Side Effects: ? EMA with Paxil not manic  Allergies: No Known Allergies  Past Medical History:  Diagnosis Date   Anxiety  Bipolar affective disorder ([place])    CAD (coronary artery disease) 2006   CABG w/ LIMA-LAD, RIMA-Diag, SVG-OM1-OM2, R radial-PDA   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   Qualifier: Diagnosis of  By: Garen Grams     Diabetes mellitus type 2 in nonobese ([place]) 07/2016   Dyslipidemia    Headache(784.0)    HTN (hypertension)    Migraine    Pulmonary embolus (HCC)    unprovoked    Family History  Problem Relation Age of Onset   Lupus Mother    Heart attack Father    Diabetes Father    Hypertension Father    Diabetes Paternal Grandmother    Hypertension Paternal Grandmother    Stroke Neg Hx    Kidney disease Neg Hx    Hyperlipidemia Neg Hx    Sudden death Neg Hx     Social History   Socioeconomic History   Marital status: Legally Separated    Spouse name: Not on file   Number of children: Not on file   Years of education: Not on file   Highest education level: Not on file  Occupational History   Occupation: mortgage loan specialist  Tobacco Use   Smoking status: Every Day    Packs/day: 0.50    Years: 25.00    Pack years: 12.50    Types: Cigarettes   Smokeless tobacco: Never  Vaping Use   Vaping Use: Never used  Substance and Sexual Activity   Alcohol  use: No    Alcohol/week: 0.0 standard drinks   Drug use: Never   Sexual activity: Yes    Partners: Male    Comment: married  Other Topics Concern   Not on file  Social History Narrative   Regular exercise:  3 days weekly   Caffeine Use:  1 soda daily   One child biological daughter born in 68 and an adopted niece.   [employer] specialist   Married- may be divorcing.          Social Determinants of Health   Financial Resource Strain: Not on file  Food Insecurity: Not on file  Transportation Needs: Not on file  Physical Activity: Not on file  Stress: Not on file  Social Connections: Not on file  Intimate Partner Violence: Not on file    Past Medical History, Surgical history, Social history, and Family history were reviewed and updated as appropriate.   Please see review of systems for further details on the patient's review from today.   Objective:   Physical Exam:  LMP 11/28/2009   Physical Exam Constitutional:      General: She is not in acute distress.    Appearance: She is well-developed.  Musculoskeletal:        General: No deformity.  Neurological:     Mental Status: She is alert and oriented to person, place, and time.     Coordination: Coordination normal.  Psychiatric:        Attention and Perception: She is attentive. She does not perceive auditory hallucinations.        Mood and Affect: Mood is anxious and depressed. Affect is not labile, blunt, angry or tearful.        Speech: Speech normal. Speech is not rapid and pressured.        Behavior: Behavior normal. Behavior is not agitated.        Thought Content: Thought content normal. Thought content does not include homicidal or suicidal ideation. Thought content does not include homicidal  or suicidal plan.        Cognition and Memory: Cognition normal.        Judgment: Judgment normal.     Comments: Insight intact. No auditory or visual hallucinations. No delusions.      Lab  Review:     Component Value Date/Time   NA 140 10/29/2020 1017   NA 141 07/23/2020 1300   K 4.1 10/29/2020 1017   CL 104 10/29/2020 1017   CO2 29 10/29/2020 1017   GLUCOSE 124 (H) 10/29/2020 1017   BUN 13 10/29/2020 1017   BUN 7 07/23/2020 1300   CREATININE 0.86 10/29/2020 1017   CREATININE 0.72 06/22/2012 0840   CALCIUM 9.9 10/29/2020 1017   PROT 6.6 10/29/2020 1017   PROT 5.8 (L) 07/17/2020 1009   ALBUMIN 4.5 10/29/2020 1017   ALBUMIN 3.6 (L) 07/17/2020 1009   AST 12 10/29/2020 1017   ALT 9 10/29/2020 1017   ALKPHOS 95 10/29/2020 1017   BILITOT 0.5 10/29/2020 1017   BILITOT 0.3 07/17/2020 1009   GFRNONAA 89 07/23/2020 1300   GFRNONAA >60 07/09/2020 0306   GFRNONAA >89 06/22/2012 0840   GFRAA 102 07/23/2020 1300   GFRAA >89 06/22/2012 0840       Component Value Date/Time   WBC 11.3 (H) 07/09/2020 0306   RBC 4.76 07/09/2020 0306   HGB 13.8 07/09/2020 0306   HCT 40.6 07/09/2020 0306   PLT 161 07/09/2020 0306   MCV 85.3 07/09/2020 0306   MCH 29.0 07/09/2020 0306   MCHC 34.0 07/09/2020 0306   RDW 12.4 07/09/2020 0306   LYMPHSABS 2.7 06/22/2012 0840   MONOABS 0.5 06/22/2012 0840   EOSABS 0.1 06/22/2012 0840   BASOSABS 0.0 06/22/2012 0840    No results found for: POCLITH, LITHIUM   No results found for: PHENYTOIN, PHENOBARB, VALPROATE, CBMZ   .res Assessment: Plan:    Bailey Greene was seen today for follow-up, anxiety, stress and depression.  Diagnoses and all orders for this visit:  Panic disorder with agoraphobia -     OLANZapine (ZYPREXA) 10 MG tablet; Take 1 tablet (10 mg total) by mouth at bedtime. -     ALPRAZolam (XANAX) 0.5 MG tablet; TAKE 2 TABLETS BY MOUTH TWICE A DAY AS NEEDED FOR ANXIETY  Moderate mixed bipolar I disorder (HCC) -     OLANZapine (ZYPREXA) 10 MG tablet; Take 1 tablet (10 mg total) by mouth at bedtime.  Generalized anxiety disorder -     OLANZapine (ZYPREXA) 10 MG tablet; Take 1 tablet (10 mg total) by mouth at bedtime.  Greater  than 50% of face to face time with patient was spent on counseling and coordination of care. We discussed the following: Bailey Greene has a little less uncontrolled mood swings throughout the week with rapid very rapid ultra-rapid cycling since the increase in Depakote to 1500 mg.  She also describes ongoing panic attacks some of which are precipitated by chronic ongoing stress and some occur out of the blue.  Her symptoms affect her both at home and at work.  Her symptoms have improved enough that they are not adversely affecting work function at this time.  She had had a brief leave of absence from work at the last visit but she is working now.  Unfortunately her anxiety still remains high.  She feels chronically stressed and is not getting enough sleep despite an increase in quetiapine.  Therefore we will switch from quetiapine to olanzapine which is as a better antianxiety effect and still  has some benefit for sleep.  It is also a mood stabilizer.  It also can help with depression when added to an SSRI.  We discussed side effects in detail. Start olanzapine 5 mg daily and if that is not sufficient within a week increase to 10 mg nightly.  We can go higher if needed and call if necessary.  BC failure of alternatives, alprazolam 0.5 mg AM before work.  Alternatives clonazepam, Ativan  Because of the severity of the panic continue paroxetine to 40 mg at night.  It was explained to her this could worsen her mood cycling and to let us know if that occurs.  Fortunately because no mood swings and her panic and anxiety symptoms are improved but not controlled.    Discussed potential metabolic side effects associated with atypical antipsychotics, as well as potential risk for movement side effects. Advised pt to contact office if movement side effects occur.  Metabolic side effects are unlikely at this low dose of quetiapine and almost no risk of EPS at this low-dose.   We discussed the short-term risks  associated with benzodiazepines including sedation and increased fall risk among others.  Discussed long-term side effect risk including dependence, potential withdrawal symptoms, and the potential eventual dose-related risk of dementia.   FMLA same as last year.  This appt was 30 mins.   Follow-up  mos  Bailey Parents MD, DFAPA  Please see After Visit Summary for patient specific instructions.  As soon as convenient, get the blood test in the morning.  No fasting is required.  Future Appointments  Date Time Provider [place]  01/30/2021  9:20 AM [name], NP [facility]  03/02/2021  2:30 PM Cottle, Billey Co., MD [facility] None    No orders of the defined types were placed in this encounter.     -------------------------------

## 2021-01-04 ENCOUNTER — Other Ambulatory Visit: Payer: Self-pay | Admitting: Psychiatry

## 2021-01-04 DIAGNOSIS — F3162 Bipolar disorder, current episode mixed, moderate: Secondary | ICD-10-CM

## 2021-01-04 DIAGNOSIS — F4001 Agoraphobia with panic disorder: Secondary | ICD-10-CM

## 2021-01-04 DIAGNOSIS — F411 Generalized anxiety disorder: Secondary | ICD-10-CM

## 2021-01-06 NOTE — Pre-Procedure Instructions (Signed)
Surgical Instructions:    Your procedure is scheduled on Friday, August 19th (11 AM- 12 Noon).  Report to [facility] Main Entrance "A" at 09:00 A.M., then check in with the Admitting office.  Call this number if you have any questions prior to your surgery date, or have problems the morning of surgery:  [phone]    Remember:  Do not eat or drink after midnight the night before your surgery.     Take these medicines the morning of surgery with A SIP OF WATER:   busPIRone (BUSPAR) metoprolol succinate (TOPROL-XL) PARoxetine (PAXIL) XIIDRA eye drops  IF NEEDED: ALPRAZolam (XANAX)   As of today, STOP taking any Aspirin (unless otherwise instructed by your surgeon) Aleve, Naproxen, Ibuprofen, Motrin, Advil, Goody's, BC's, all herbal medications, fish oil, and all vitamins.           WHAT DO I DO ABOUT MY DIABETES MEDICATION?  THE NIGHT BEFORE SURGERY: Take 6 units of Insulin Glargine [facility]).       THE MORNING OF SURGERY: Take 6 units of Insulin Glargine [facility]). Do not take sitaGLIPtin (JANUVIA).   HOW TO MANAGE YOUR DIABETES BEFORE AND AFTER SURGERY  Why is it important to control my blood sugar before and after surgery? Improving blood sugar levels before and after surgery helps healing and can limit problems. A way of improving blood sugar control is eating a healthy diet by:  Eating less sugar and carbohydrates  Increasing activity/exercise  Talking with your doctor about reaching your blood sugar goals High blood sugars (greater than 180 mg/dL) can raise your risk of infections and slow your recovery, so you will need to focus on controlling your diabetes during the weeks before surgery. Make sure that the doctor who takes care of your diabetes knows about your planned surgery including the date and location.  How do I manage my blood sugar before surgery? Check your blood sugar at least 4 times a day, starting 2 days before surgery, to  make sure that the level is not too high or low.  Check your blood sugar the morning of your surgery when you wake up and every 2 hours until you get to the Short Stay unit.  If your blood sugar is less than 70 mg/dL, you will need to treat for low blood sugar: Do not take insulin. Treat a low blood sugar (less than 70 mg/dL) with  cup of clear juice (cranberry or apple), 4 glucose tablets, OR glucose gel. Recheck blood sugar in 15 minutes after treatment (to make sure it is greater than 70 mg/dL). If your blood sugar is not greater than 70 mg/dL on recheck, call [phone] for further instructions. Report your blood sugar to the short stay nurse when you get to Short Stay.  If you are admitted to the hospital after surgery: Your blood sugar will be checked by the staff and you will probably be given insulin after surgery (instead of oral diabetes medicines) to make sure you have good blood sugar levels. The goal for blood sugar control after surgery is 80-180 mg/dL.    Special instructions:    [place]- Preparing For Surgery  Before surgery, you can play an important role. Because skin is not sterile, your skin needs to be as free of germs as possible. You can reduce the number of germs on your skin by washing with CHG (chlorahexidine gluconate) Soap before surgery.  CHG is an antiseptic cleaner which kills germs and bonds with the  skin to continue killing germs even after washing.     Please do not use if you have an allergy to CHG or antibacterial soaps. If your skin becomes reddened/irritated stop using the CHG.  Do not shave (including legs and underarms) for at least 48 hours prior to first CHG shower. It is OK to shave your face.  Please follow these instructions carefully.     Shower the NIGHT BEFORE SURGERY and the MORNING OF SURGERY with CHG Soap.   If you chose to wash your hair, wash your hair first as usual with your normal shampoo. After you shampoo, rinse your hair  and body thoroughly to remove the shampoo.  Then [employer] and genitals (private parts) with your normal soap and rinse thoroughly to remove soap.  After that Use CHG Soap as you would any other liquid soap. You can apply CHG directly to the skin and wash gently with a scrungie or a clean washcloth.   Apply the CHG Soap to your body ONLY FROM THE NECK DOWN.  Do not use on open wounds or open sores. Avoid contact with your eyes, ears, mouth and genitals (private parts). Wash Face and genitals (private parts)  with your normal soap.   Wash thoroughly, paying special attention to the area where your surgery will be performed.  Thoroughly rinse your body with warm water from the neck down.  DO NOT shower/wash with your normal soap after using and rinsing off the CHG Soap.  Pat yourself dry with a CLEAN TOWEL.  Wear CLEAN PAJAMAS to bed the night before surgery  Place CLEAN SHEETS on your bed the night before your surgery  DO NOT SLEEP WITH PETS.   Day of Surgery:  Take a shower with CHG soap. Wear Clean/Comfortable clothing the morning of surgery Do not wear lotions, powders, perfumes, or deodorant.   Remember to brush your teeth WITH YOUR REGULAR TOOTHPASTE. Do not wear jewelry or makeup. DO Not wear nail polish, gel polish, artificial nails, or any other type of covering on natural nails including finger and toenails. If patients have artificial nails, gel coating, etc. that need to be removed by a nail salon please have this removed prior to surgery or surgery may need to be canceled/delayed if the surgeon/ anesthesia feels like the patient is unable to be adequately monitored. Do not shave 48 hours prior to surgery.   Do not bring valuables to the hospital. [facility] is not responsible for any belongings or valuables.  Do NOT Smoke (Tobacco/Vaping) or drink Alcohol 24 hours prior to your procedure.  If you use a CPAP at night, you may bring all equipment for your overnight  stay.   Contacts, glasses, dentures or bridgework may not be worn into surgery, please bring cases for these belongings.   For patients admitted to the hospital, discharge time will be determined by your treatment team.  Patients discharged the day of surgery will not be allowed to drive home, and someone needs to stay with them for 24 hours.  ONLY 1 SUPPORT PERSON MAY BE PRESENT WHILE YOU ARE IN SURGERY. IF YOU ARE TO BE ADMITTED ONCE YOU ARE IN YOUR ROOM YOU WILL BE ALLOWED TWO (2) VISITORS.  Minor children may have two parents present. Special consideration for safety and communication needs will be reviewed on a case by case basis.     Please read over the following fact sheets that you were given.

## 2021-01-07 ENCOUNTER — Encounter (HOSPITAL_COMMUNITY): Payer: Self-pay

## 2021-01-07 ENCOUNTER — Other Ambulatory Visit: Payer: Self-pay

## 2021-01-07 ENCOUNTER — Encounter (HOSPITAL_COMMUNITY)
Admission: RE | Admit: 2021-01-07 | Discharge: 2021-01-07 | Disposition: A | Discharge status: Home / Self Care | Payer: 59 | Source: Ambulatory Visit | Attending: Surgery | Admitting: Surgery

## 2021-01-07 DIAGNOSIS — Z01812 Encounter for preprocedural laboratory examination: Secondary | ICD-10-CM | POA: Insufficient documentation

## 2021-01-07 LAB — CBC
HCT: 39.7 % (ref 36.0–46.0)
Hemoglobin: 13.2 g/dL (ref 12.0–15.0)
MCH: 29.6 pg (ref 26.0–34.0)
MCHC: 33.2 g/dL (ref 30.0–36.0)
MCV: 89 fL (ref 80.0–100.0)
Platelets: 194 10*3/uL (ref 150–400)
RBC: 4.46 MIL/uL (ref 3.87–5.11)
RDW: 13.2 % (ref 11.5–15.5)
WBC: 9 10*3/uL (ref 4.0–10.5)
nRBC: 0 % (ref 0.0–0.2)

## 2021-01-07 LAB — BASIC METABOLIC PANEL
Anion gap: 6 (ref 5–15)
BUN: 11 mg/dL (ref 6–20)
CO2: 29 mmol/L (ref 22–32)
Calcium: 9.8 mg/dL (ref 8.9–10.3)
Chloride: 103 mmol/L (ref 98–111)
Creatinine, Ser: 0.96 mg/dL (ref 0.44–1.00)
GFR, Estimated: >60 mL/min (ref >60)
Glucose, Bld: 79 mg/dL (ref 70–99)
Potassium: 4 mmol/L (ref 3.5–5.1)
Sodium: 138 mmol/L (ref 135–145)

## 2021-01-07 LAB — GLUCOSE, CAPILLARY: Glucose-Capillary: 97 mg/dL (ref 70–99)

## 2021-01-07 LAB — HEMOGLOBIN A1C
Hgb A1c MFr Bld: 6.8 % — ABNORMAL HIGH (ref 4.8–5.6)
Mean Plasma Glucose: 148.46 mg/dL

## 2021-01-07 NOTE — Progress Notes (Signed)
PCP - Sandford Craze, NP Cardiologist - Rollene Rotunda, MD  PPM/ICD - Denies  Chest x-ray - 07/07/20 EKG - 07/23/20 Stress Test - 07/27/17 ECHO - 08/14/20 Cardiac Cath - 06/30/05  Sleep Study - Denies  Fasting Blood Sugar - 90-low 100s Checks Blood Sugar 3-4 times a day. A1C obtained.  Blood Thinner Instructions: N/A Aspirin Instructions: N/A  ERAS Protcol - N/A PRE-SURGERY Ensure or G2- N/A  COVID TEST- Pt instructed to get covid test 01/12/21 @ [facility]. Pt given map and lab requisition form   Anesthesia review: Yes, cardiac hx, hx PE  Patient denies shortness of breath, fever, cough and chest pain at PAT appointment   All instructions explained to the patient, with a verbal understanding of the material. Patient agrees to go over the instructions while at home for a better understanding.  The opportunity to ask questions was provided.

## 2021-01-08 NOTE — Anesthesia Preprocedure Evaluation (Addendum)
Anesthesia Evaluation   Patient awake    Reviewed: Allergy & Precautions, NPO status , Patient's Chart, lab work & pertinent test results  Airway Mallampati: II  TM Distance: >3 FB Neck ROM: Full    Dental  (+) Edentulous Upper, Edentulous Lower   Pulmonary Current Smoker and Patient abstained from smoking.,    Pulmonary exam normal breath sounds clear to auscultation       Cardiovascular hypertension, + CAD and + CABG  Normal cardiovascular exam Rhythm:Regular Rate:Normal  ECHO 08/14/20 1. Left ventricular ejection fraction, by estimation, is 60 to 65%. The left ventricle has normal function. The left ventricle has no regional wall motion abnormalities. Left ventricular diastolic parameters were normal. The average left ventricular global longitudinal strain is - 19.5 %. 2. Right ventricular systolic function is normal. The right ventricular size is normal. 3. The mitral valve is normal in structure. No evidence of mitral valve regurgitation. 4. The aortic valve is normal in structure. Aortic valve regurgitation is not visualized. No aortic stenosis is present. Comparison(s): 07/19/17 EF 55-60%.   Neuro/Psych  Headaches, PSYCHIATRIC DISORDERS Anxiety  Neuromuscular disease    GI/Hepatic negative GI ROS,   Endo/Other  diabetes, Type 2, Insulin Dependent  Renal/GU   negative genitourinary   Musculoskeletal negative musculoskeletal ROS (+)   Abdominal   Peds  Hematology  (+) Blood dyscrasia (h/o PE Feb 2022; PCP states she can stop eliquis/xarelto entirely), ,   Anesthesia Other Findings   Reproductive/Obstetrics negative OB ROS                          Anesthesia Physical Anesthesia Plan  ASA: 3  Anesthesia Plan: General   Post-op Pain Management:    Induction: Intravenous  PONV Risk Score and Plan: 2 and Treatment may vary due to age or medical condition, Scopolamine patch - Pre-op,  Midazolam, Dexamethasone and Ondansetron  Airway Management Planned: Oral ETT  Additional Equipment:   Intra-op Plan:   Post-operative Plan: Extubation in OR  Informed Consent: I have reviewed the patients History and Physical, chart, labs and discussed the procedure including the risks, benefits and alternatives for the proposed anesthesia with the patient or authorized representative who has indicated his/her understanding and acceptance.     Dental advisory given  Plan Discussed with: CRNA  Anesthesia Plan Comments:       Anesthesia Quick Evaluation

## 2021-01-08 NOTE — Progress Notes (Signed)
Anesthesia Chart Review:  Case: [id number] Date/Time: 01/16/21 1045   Procedure: HERNIA REPAIR UMBILICAL ADULT   Anesthesia type: General   Pre-op diagnosis: umbilical hernia   Location: [facility] OR ROOM 02 / [place] OR   Surgeons: Dwan Bolt, MD       DISCUSSION: Patient is a 60 year old female scheduled for the above procedure.  History includes smoking, HTN, dyslipidemia, CAD (s/p CABG x5: LIMA-LAD, free RIMA-DIAG, SVG-OM1-OM2, right RA-PDA 07/08/04), DM2, migraines, Bipolar affective disorder, PE (07/08/20, 3-6 months Eliquis, discontinued 12/08/20).  Preoperative cardiology input outlined by Fabian Sharp, PA-C on 12/08/20, "Bailey Greene was last seen on 07/23/20 by Sande Rives PA-C.  Since that day, Bailey Greene has done well.  She can complete more than 4.0 METS without angina. She has a history of CABG and IDDM. If open abdominal procedure, she is at increased risk of cardiovascular complications - 16% risk according to the RCRI. Reassuring stress test in 2019 and echocardiogram 07/2020. I do not think additional testing at this time would mitigate risk. She is aware she is at higher risk for this high risk procedure and agrees to proceed.     Therefore, based on ACC/AHA guidelines, the patient would be at acceptable risk for the planned procedure without further cardiovascular testing.    She take xarelto, although not listed on med list in Epic, for PE diagnosed Feb 2022. Please reach out to her PCP for xarelto hold as they manage this. .." Per PCP [name], NP, patient can discontinue her Xarelto, patient notified 12/08/20.    Preoperative COVID-19 test is scheduled for 01/12/2021. Anesthesia team to evaluate on the day of surgery.    VS: BP 125/85   Pulse 81   Temp 37.2 C (Oral)   Resp 17   Ht '5\' 5"'  (1.651 m)   Wt 63.9 kg   LMP 11/28/2009   SpO2 100%   BMI 23.43 kg/m    PROVIDERS: [name], NP is PCP - Minus Breeding, MD is cardiologist. Last visit  07/23/20 with Sande Rives, PA-C. She notes patient with "long-standing history of sharp chest pain in the setting of emotional stress." Non-ischemic stress test in 2019. Recent diagnosis of PE. Was having palpitations associated with CP and lightheadedness. Echo and Zio monitor ordered. One NSVT of 11 beats and SVT of 6 beats. B-blocker increased.  Bailey Parents, MD is psychiatrist   LABS: Labs reviewed: Acceptable for surgery. LFTs normal 10/29/20. (all labs ordered are listed, but only abnormal results are displayed)  Labs Reviewed  HEMOGLOBIN A1C - Abnormal; Notable for the following components:      Result Value   Hgb A1c MFr Bld 6.8 (*)    All other components within normal limits  GLUCOSE, CAPILLARY  BASIC METABOLIC PANEL  CBC     IMAGES: CT Head 10/17/20: IMPRESSION: Minor scattered white matter microvascular ischemic changes. No acute intracranial abnormality by noncontrast CT.   CTA Chest 07/08/20: IMPRESSION: - Small proximal segmental left upper lobe pulmonary embolus. Very low thrombus burden. No evidence of heart strain. - No significant central or proximal hilar large PE. - Inferior lingula and bibasilar atelectasis - Aortic Atherosclerosis (ICD10-I70.0).   CXR 07/07/20: IMPRESSION: Lungs clear. Heart size normal. Status post coronary artery bypass grafting. Aortic Atherosclerosis (ICD10-I70.0).   EKG: 07/23/20: NSR. Possible left atrial enlargement.   CV: Echo 08/14/20: IMPRESSIONS   1. Left ventricular ejection fraction, by estimation, is 60 to 65%. The  left ventricle has normal function. The left ventricle  has no regional  wall motion abnormalities. Left ventricular diastolic parameters were  normal. The average left ventricular  global longitudinal strain is -19.5 %.   2. Right ventricular systolic function is normal. The right ventricular  size is normal.   3. The mitral valve is normal in structure. No evidence of mitral valve  regurgitation.    4. The aortic valve is normal in structure. Aortic valve regurgitation is  not visualized. No aortic stenosis is present.  - Comparison(s): 07/19/17 EF 55-60%.    Long term event monitor 07/28/20-08/04/20: Patient had a min HR of 60 bpm, max HR of 203 bpm, and avg HR of 88 bpm. Predominant underlying rhythm was Sinus Rhythm. 1 run of Ventricular Tachycardia occurred lasting 6 beats with a max rate of 176 bpm (avg 166 bpm). 1 run of Supraventricular Tachycardia occurred lasting 11 beats with a max rate of 203 bpm (avg 184 bpm). Isolated SVEs were rare (<1.0%), SVE Couplets were rare (<1.0%), and SVE Triplets were rare (<1.0%). Isolated VEs were rare (<1.0%), and no VE Couplets or VE Triplets were present.   Normal sinus rhythm One run of non sustained ventricular tachycardia with the longest being 11 beats One run of supraventricular tachycardia with the longest lasting six beats.   No sustained arrhythmias.     Nuclear stress test 07/27/17: Nuclear stress EF: 64%. No wall motion abnormalities The left ventricular ejection fraction is normal (55-65%). There was no ST segment deviation noted during stress. Defect 1: There is a small defect of mild severity present in the apex location. This is a low risk study. No ischemia identified   Cardiac cath 06/30/05: CONCLUSION:  Severe three vessel coronary artery disease.  Patent grafts.  Well preserved ejection fraction.    Past Medical History:  Diagnosis Date   Anxiety    Bipolar affective disorder ([place])    CAD (coronary artery disease) 2006   CABG w/ LIMA-LAD, RIMA-Diag, SVG-OM1-OM2, R radial-PDA   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   Qualifier: Diagnosis of  By: Garen Grams     Diabetes mellitus type 2 in nonobese ([place]) 07/2016   Dyslipidemia    Headache(784.0)    HTN (hypertension)    Migraine    Pulmonary embolus ([place])    unprovoked    Past Surgical History:  Procedure Laterality Date   BUNIONECTOMY  08/2011   right foot   CARDIAC  CATHETERIZATION  2007   severe native 3 v dz, all grafts patent (LIMA-LAD, RIMA-Diag, SVG-OM1-OM2, R radial-PDA)   [place]   CORONARY ARTERY BYPASS GRAFT  2006    Coronary artery bypass grafting x5 with a left  internal  mammary to the left anterior descending coronary artery.  Free right  internal mammary to the diagonal coronary artery, sequential reverse  saphenous vein graft to the first and second obtuse marginal, right  artery bypass to the posterior descending coronary artery with endo-vein harvesting.    MEDICATIONS:  Accu-Chek Softclix Lancets lancets   ALPRAZolam (XANAX) 0.5 MG tablet   blood glucose meter kit and supplies KIT   busPIRone (BUSPAR) 30 MG tablet   divalproex (DEPAKOTE ER) 500 MG 24 hr tablet   doxycycline (VIBRA-TABS) 100 MG tablet   Insulin Glargine (BASAGLAR KWIKPEN) 100 UNIT/ML   insulin glargine (LANTUS) 100 UNIT/ML Solostar Pen   Insulin Pen Needle (PEN NEEDLES) 33G X 4 MM MISC   metoprolol succinate (TOPROL-XL) 25 MG 24 hr tablet   metoprolol succinate (TOPROL-XL) 50 MG 24 hr tablet  OLANZapine (ZYPREXA) 10 MG tablet   PARoxetine (PAXIL) 20 MG tablet   simvastatin (ZOCOR) 80 MG tablet   sitaGLIPtin (JANUVIA) 50 MG tablet   XIIDRA 5 % SOLN   No current facility-administered medications for this encounter.    Myra Gianotti, PA-C Surgical Short Stay/Anesthesiology [facility] Phone [phone] [facility] Phone [phone] 01/08/2021 6:18 PM

## 2021-01-14 ENCOUNTER — Other Ambulatory Visit: Payer: Self-pay | Admitting: Family

## 2021-01-16 ENCOUNTER — Ambulatory Visit (HOSPITAL_COMMUNITY): Payer: 59 | Admitting: Vascular Surgery

## 2021-01-16 ENCOUNTER — Other Ambulatory Visit: Payer: Self-pay

## 2021-01-16 ENCOUNTER — Encounter (HOSPITAL_COMMUNITY)
Admission: RE | Disposition: A | Discharge status: Home / Self Care | Payer: Self-pay | Source: Home / Self Care | Attending: Surgery

## 2021-01-16 ENCOUNTER — Encounter (HOSPITAL_COMMUNITY): Payer: Self-pay | Admitting: Surgery

## 2021-01-16 ENCOUNTER — Ambulatory Visit (HOSPITAL_COMMUNITY): Payer: 59 | Admitting: Certified Registered Nurse Anesthetist

## 2021-01-16 ENCOUNTER — Ambulatory Visit (HOSPITAL_COMMUNITY)
Admission: RE | Admit: 2021-01-16 | Discharge: 2021-01-16 | Disposition: A | Discharge status: Home / Self Care | Payer: 59 | Attending: Surgery | Admitting: Surgery

## 2021-01-16 DIAGNOSIS — Z79899 Other long term (current) drug therapy: Secondary | ICD-10-CM | POA: Insufficient documentation

## 2021-01-16 DIAGNOSIS — Z833 Family history of diabetes mellitus: Secondary | ICD-10-CM | POA: Diagnosis not present

## 2021-01-16 DIAGNOSIS — Z98891 History of uterine scar from previous surgery: Secondary | ICD-10-CM | POA: Diagnosis not present

## 2021-01-16 DIAGNOSIS — I2510 Atherosclerotic heart disease of native coronary artery without angina pectoris: Secondary | ICD-10-CM | POA: Diagnosis not present

## 2021-01-16 DIAGNOSIS — I10 Essential (primary) hypertension: Secondary | ICD-10-CM | POA: Diagnosis not present

## 2021-01-16 DIAGNOSIS — K429 Umbilical hernia without obstruction or gangrene: Secondary | ICD-10-CM | POA: Insufficient documentation

## 2021-01-16 DIAGNOSIS — F17210 Nicotine dependence, cigarettes, uncomplicated: Secondary | ICD-10-CM | POA: Insufficient documentation

## 2021-01-16 DIAGNOSIS — E785 Hyperlipidemia, unspecified: Secondary | ICD-10-CM | POA: Insufficient documentation

## 2021-01-16 DIAGNOSIS — E119 Type 2 diabetes mellitus without complications: Secondary | ICD-10-CM | POA: Diagnosis not present

## 2021-01-16 DIAGNOSIS — Z794 Long term (current) use of insulin: Secondary | ICD-10-CM | POA: Diagnosis not present

## 2021-01-16 DIAGNOSIS — Z8249 Family history of ischemic heart disease and other diseases of the circulatory system: Secondary | ICD-10-CM | POA: Diagnosis not present

## 2021-01-16 DIAGNOSIS — Z951 Presence of aortocoronary bypass graft: Secondary | ICD-10-CM | POA: Diagnosis not present

## 2021-01-16 DIAGNOSIS — Z86711 Personal history of pulmonary embolism: Secondary | ICD-10-CM | POA: Diagnosis not present

## 2021-01-16 HISTORY — PX: UMBILICAL HERNIA REPAIR: SHX196

## 2021-01-16 LAB — GLUCOSE, CAPILLARY
Glucose-Capillary: 143 mg/dL — ABNORMAL HIGH (ref 70–99)
Glucose-Capillary: 121 mg/dL — ABNORMAL HIGH (ref 70–99)
Glucose-Capillary: 123 mg/dL — ABNORMAL HIGH (ref 70–99)

## 2021-01-16 SURGERY — REPAIR, HERNIA, UMBILICAL, ADULT
Anesthesia: General | Site: Abdomen

## 2021-01-16 MED ORDER — MIDAZOLAM HCL 2 MG/2ML IJ SOLN
INTRAMUSCULAR | Status: AC
  Filled 2021-01-16: qty 2

## 2021-01-16 MED ORDER — MIDAZOLAM HCL 5 MG/5ML IJ SOLN
INTRAMUSCULAR | Status: DC | PRN
  Administered 2021-01-16 (×2): 1 mg via INTRAVENOUS

## 2021-01-16 MED ORDER — OXYCODONE HCL 5 MG PO TABS: 5.0000 mg | ORAL_TABLET | Freq: Four times a day (QID) | ORAL | 0 refills | Status: DC | PRN

## 2021-01-16 MED ORDER — ORAL CARE MOUTH RINSE: 15.0000 mL | Freq: Once | OROMUCOSAL | Status: AC

## 2021-01-16 MED ORDER — CHLORHEXIDINE GLUCONATE 0.12 % MT SOLN
15.0000 mL | Freq: Once | OROMUCOSAL | Status: AC
  Administered 2021-01-16: 15 mL via OROMUCOSAL
  Filled 2021-01-16: qty 15

## 2021-01-16 MED ORDER — 0.9 % SODIUM CHLORIDE (POUR BTL) OPTIME
TOPICAL | Status: DC | PRN
  Administered 2021-01-16: 1000 mL

## 2021-01-16 MED ORDER — SCOPOLAMINE 1 MG/3DAYS TD PT72
MEDICATED_PATCH | TRANSDERMAL | Status: AC
  Filled 2021-01-16: qty 1

## 2021-01-16 MED ORDER — BUPIVACAINE-EPINEPHRINE (PF) 0.25% -1:200000 IJ SOLN
INTRAMUSCULAR | Status: AC
  Filled 2021-01-16: qty 30

## 2021-01-16 MED ORDER — FENTANYL CITRATE (PF) 250 MCG/5ML IJ SOLN
INTRAMUSCULAR | Status: DC | PRN
  Administered 2021-01-16: 50 ug via INTRAVENOUS
  Administered 2021-01-16: 100 ug via INTRAVENOUS
  Administered 2021-01-16 (×2): 50 ug via INTRAVENOUS

## 2021-01-16 MED ORDER — SCOPOLAMINE 1 MG/3DAYS TD PT72
1.0000 | MEDICATED_PATCH | TRANSDERMAL | Status: DC
  Administered 2021-01-16: 1 via TRANSDERMAL
  Filled 2021-01-16: qty 1

## 2021-01-16 MED ORDER — OXYCODONE HCL 5 MG PO TABS
ORAL_TABLET | ORAL | Status: AC
  Filled 2021-01-16: qty 1

## 2021-01-16 MED ORDER — ACETAMINOPHEN 500 MG PO TABS
1000.0000 mg | ORAL_TABLET | ORAL | Status: AC
  Administered 2021-01-16: 1000 mg via ORAL
  Filled 2021-01-16: qty 2

## 2021-01-16 MED ORDER — AMISULPRIDE (ANTIEMETIC) 5 MG/2ML IV SOLN: 10.0000 mg | Freq: Once | INTRAVENOUS | Status: DC | PRN

## 2021-01-16 MED ORDER — OXYCODONE HCL 5 MG/5ML PO SOLN: 5.0000 mg | Freq: Once | ORAL | Status: AC | PRN

## 2021-01-16 MED ORDER — GABAPENTIN 300 MG PO CAPS
300.0000 mg | ORAL_CAPSULE | ORAL | Status: AC
  Administered 2021-01-16: 300 mg via ORAL
  Filled 2021-01-16: qty 1

## 2021-01-16 MED ORDER — CEFAZOLIN SODIUM-DEXTROSE 2-4 GM/100ML-% IV SOLN
2.0000 g | INTRAVENOUS | Status: AC
  Administered 2021-01-16: 2 g via INTRAVENOUS
  Filled 2021-01-16: qty 100

## 2021-01-16 MED ORDER — DEXAMETHASONE SODIUM PHOSPHATE 10 MG/ML IJ SOLN
INTRAMUSCULAR | Status: DC | PRN
  Administered 2021-01-16: 5 mg via INTRAVENOUS

## 2021-01-16 MED ORDER — OXYCODONE HCL 5 MG PO TABS
5.0000 mg | ORAL_TABLET | Freq: Once | ORAL | Status: AC | PRN
Start: 2021-01-16 — End: 2021-01-16
  Administered 2021-01-16: 5 mg via ORAL

## 2021-01-16 MED ORDER — SUGAMMADEX SODIUM 200 MG/2ML IV SOLN
INTRAVENOUS | Status: DC | PRN
  Administered 2021-01-16 (×2): 100 mg via INTRAVENOUS

## 2021-01-16 MED ORDER — FENTANYL CITRATE (PF) 100 MCG/2ML IJ SOLN
25.0000 ug | INTRAMUSCULAR | Status: DC | PRN
  Administered 2021-01-16: 50 ug via INTRAVENOUS
  Administered 2021-01-16 (×2): 25 ug via INTRAVENOUS

## 2021-01-16 MED ORDER — LIDOCAINE 2% (20 MG/ML) 5 ML SYRINGE
INTRAMUSCULAR | Status: DC | PRN
  Administered 2021-01-16: 60 mg via INTRAVENOUS

## 2021-01-16 MED ORDER — ONDANSETRON HCL 4 MG/2ML IJ SOLN
INTRAMUSCULAR | Status: DC | PRN
  Administered 2021-01-16: 4 mg via INTRAVENOUS

## 2021-01-16 MED ORDER — PHENYLEPHRINE 40 MCG/ML (10ML) SYRINGE FOR IV PUSH (FOR BLOOD PRESSURE SUPPORT)
PREFILLED_SYRINGE | INTRAVENOUS | Status: AC
  Filled 2021-01-16: qty 10

## 2021-01-16 MED ORDER — FENTANYL CITRATE (PF) 100 MCG/2ML IJ SOLN
INTRAMUSCULAR | Status: AC
  Filled 2021-01-16: qty 2

## 2021-01-16 MED ORDER — FENTANYL CITRATE (PF) 250 MCG/5ML IJ SOLN
INTRAMUSCULAR | Status: AC
  Filled 2021-01-16: qty 5

## 2021-01-16 MED ORDER — PHENYLEPHRINE 40 MCG/ML (10ML) SYRINGE FOR IV PUSH (FOR BLOOD PRESSURE SUPPORT)
PREFILLED_SYRINGE | INTRAVENOUS | Status: DC | PRN
  Administered 2021-01-16: 80 ug via INTRAVENOUS

## 2021-01-16 MED ORDER — PROPOFOL 10 MG/ML IV BOLUS
INTRAVENOUS | Status: DC | PRN
  Administered 2021-01-16: 120 mg via INTRAVENOUS

## 2021-01-16 MED ORDER — LACTATED RINGERS IV SOLN: INTRAVENOUS | Status: DC

## 2021-01-16 MED ORDER — ROCURONIUM BROMIDE 10 MG/ML (PF) SYRINGE
PREFILLED_SYRINGE | INTRAVENOUS | Status: DC | PRN
Start: 2021-01-16 — End: 2021-01-16
  Administered 2021-01-16: 60 mg via INTRAVENOUS

## 2021-01-16 MED ORDER — PROPOFOL 10 MG/ML IV BOLUS
INTRAVENOUS | Status: AC
  Filled 2021-01-16: qty 20

## 2021-01-16 MED ORDER — PROMETHAZINE HCL 25 MG/ML IJ SOLN: 6.2500 mg | INTRAMUSCULAR | Status: DC | PRN

## 2021-01-16 MED ORDER — BUPIVACAINE-EPINEPHRINE 0.25% -1:200000 IJ SOLN
INTRAMUSCULAR | Status: DC | PRN
  Administered 2021-01-16: 30 mL

## 2021-01-16 SURGICAL SUPPLY — 35 items
ADH SKN CLS APL DERMABOND .7 (GAUZE/BANDAGES/DRESSINGS) ×1
APL PRP STRL LF DISP 70% ISPRP (MISCELLANEOUS) ×1
BAG COUNTER SPONGE SURGICOUNT (BAG) ×2 IMPLANT
BAG SPNG CNTER NS LX DISP (BAG) ×1
BLADE CLIPPER SURG (BLADE) IMPLANT
CANISTER SUCT 3000ML PPV (MISCELLANEOUS) IMPLANT
CHLORAPREP W/TINT 26 (MISCELLANEOUS) ×2 IMPLANT
COVER SURGICAL LIGHT HANDLE (MISCELLANEOUS) ×2 IMPLANT
DERMABOND ADVANCED (GAUZE/BANDAGES/DRESSINGS) ×1
DERMABOND ADVANCED .7 DNX12 (GAUZE/BANDAGES/DRESSINGS) ×1 IMPLANT
DRAPE LAPAROTOMY 100X72 PEDS (DRAPES) ×2 IMPLANT
ELECT CAUTERY BLADE 6.4 (BLADE) IMPLANT
ELECT REM PT RETURN 9FT ADLT (ELECTROSURGICAL) ×2
ELECTRODE REM PT RTRN 9FT ADLT (ELECTROSURGICAL) ×1 IMPLANT
GLOVE SURG POLY MICRO LF SZ5.5 (GLOVE) ×2 IMPLANT
GLOVE SURG UNDER POLY LF SZ6 (GLOVE) ×2 IMPLANT
GOWN STRL REUS W/ TWL LRG LVL3 (GOWN DISPOSABLE) ×2 IMPLANT
GOWN STRL REUS W/TWL LRG LVL3 (GOWN DISPOSABLE) ×4
KIT BASIN OR (CUSTOM PROCEDURE TRAY) ×2 IMPLANT
KIT TURNOVER KIT B (KITS) ×2 IMPLANT
MESH VENTRALEX ST 1-7/10 CRC S (Mesh General) ×1 IMPLANT
NDL HYPO 25GX1X1/2 BEV (NEEDLE) ×1 IMPLANT
NEEDLE HYPO 25GX1X1/2 BEV (NEEDLE) ×2 IMPLANT
NS IRRIG 1000ML POUR BTL (IV SOLUTION) ×2 IMPLANT
PACK GENERAL/GYN (CUSTOM PROCEDURE TRAY) ×2 IMPLANT
PAD ARMBOARD 7.5X6 YLW CONV (MISCELLANEOUS) ×2 IMPLANT
PENCIL SMOKE EVACUATOR (MISCELLANEOUS) ×2 IMPLANT
SUT MNCRL AB 4-0 PS2 18 (SUTURE) ×2 IMPLANT
SUT NOVA NAB DX-16 0-1 5-0 T12 (SUTURE) ×3 IMPLANT
SUT NOVA NAB GS-21 0 18 T12 DT (SUTURE) ×1 IMPLANT
SUT VIC AB 3-0 SH 27 (SUTURE) ×2
SUT VIC AB 3-0 SH 27X BRD (SUTURE) ×1 IMPLANT
SYR CONTROL 10ML LL (SYRINGE) ×2 IMPLANT
TOWEL GREEN STERILE (TOWEL DISPOSABLE) ×2 IMPLANT
TOWEL GREEN STERILE FF (TOWEL DISPOSABLE) ×2 IMPLANT

## 2021-01-16 NOTE — Op Note (Signed)
Date: 01/16/21  Patient: [name] MRN: [id number]  Preoperative Diagnosis: Umbilical hernia Postoperative Diagnosis: Same  Procedure: Open umbilical hernia repair with mesh placement  Surgeon: Sophronia Simas, MD  EBL: Minimal  Anesthesia: General  Specimens: None  Indications: [name] is a 60 yo female who presented with a symptomatic umbilical hernia. She elected to proceed with surgical repair.  Findings: Umbilical hernia containing umbilical fat, 1.5cm fascial defect. Repaired with a 4.2cm Ventralex mesh underlay.  Procedure details: Informed consent was obtained in the preoperative area prior to the procedure. The patient was brought to the operating room and placed on the table in the supine position. General anesthesia was induced and appropriate lines and drains were placed for intraoperative monitoring. Perioperative antibiotics were administered per SCIP guidelines. The abdomen was prepped and draped in the usual sterile fashion. A pre-procedure timeout was taken verifying patient identity, surgical site and procedure to be performed.  A curvilinear supraumbilical skin incision was made and the subcutaneous tissue was divided with cautery. A hernia sac was encountered just superior to the umbilicus. The sac was opened and dissected off the fascia, and contained viable omental fat, which was reduced back into the abdomen. A second hernia defect was noted at the umbilicus. The umbilical stalk was encircled and the overlying skin was taken off the stalk and hernia sac using cautery. The hernia contained omental fat, which was viable and was reduced back into the abdomen. There was a small fascial bridge between the two defects, which was divided with cautery. The resulting defect was measured at 1.5cm in diameter. The fascial edges were freed of overlying subcutaneous tissue using cautery. The peritoneal surface around the defect was palpated and was free of omentum and  adhesions. A 4.2cm piece of round Ventralex mesh was brought onto the field. It was placed deep to the fascia and secured to the fascia at 4 points using 1 Novafil sutures, then the sutures were tied down to the bring the mesh flush against the abdominal wall. The fascia was then closed over the mesh using interrupted 1 Novafil sutures. The umbilical skin was tacked down to the fascia using 3-0 Vicryl, and the skin was reapproximated with a layer of simple interrupted 3-0 Vicryl sutures. The skin was closed with running subcuticular 4-0 monocryl suture.  The patient tolerated the procedure well with no apparent complications. All counts were correct x2 at the end of the procedure. The patient was extubated and taken to PACU in stable condition.  Sophronia Simas, MD 01/16/21 12:49 PM

## 2021-01-16 NOTE — Anesthesia Procedure Notes (Signed)
Procedure Name: Intubation Date/Time: 01/16/2021 11:29 AM Performed by: Janene Harvey, CRNA Pre-anesthesia Checklist: Patient identified, Emergency Drugs available, Suction available and Patient being monitored Patient Re-evaluated:Patient Re-evaluated prior to induction Oxygen Delivery Method: Circle system utilized Preoxygenation: Pre-oxygenation with 100% oxygen Induction Type: IV induction Ventilation: Mask ventilation without difficulty Laryngoscope Size: Mac and 4 Grade View: Grade I Tube type: Oral Tube size: 7.0 mm Number of attempts: 1 Airway Equipment and Method: Stylet and Oral airway Placement Confirmation: ETT inserted through vocal cords under direct vision, positive ETCO2 and breath sounds checked- equal and bilateral Secured at: 21 cm Tube secured with: Tape Dental Injury: Teeth and Oropharynx as per pre-operative assessment

## 2021-01-16 NOTE — Discharge Instructions (Addendum)
[facility] DISCHARGE INSTRUCTIONS  Activity No heavy lifting greater than 15 pounds for 6 weeks after surgery. Ok to shower, but do not bathe or submerge incisions underwater. Do not drive while taking narcotic pain medication.  Wound Care Your incision is covered with skin glue called Dermabond. This will peel off on its own over time. You may shower and allow warm soapy water to run over your incisions. Gently pat dry. Do not submerge your incision underwater. Monitor your incision for any new redness, tenderness, or drainage.  When to Call us: Fever greater than 100.5 New redness, drainage, or swelling at incision site Severe pain, nausea, or vomiting  Follow-up You have an appointment scheduled with Dr. Freida Busman on February 04, 2021 at 9:30am. This will be at the [facility] Surgery office at [address], [address], [place], [place]. Please arrive at least 15 minutes prior to your scheduled appointment time.  For questions or concerns, please call the office at [phone].

## 2021-01-16 NOTE — Anesthesia Postprocedure Evaluation (Signed)
Anesthesia Post Note  Patient: Bailey Greene  Procedure(s) Performed: UMBILICAL  HERNIA REPAIR (Abdomen)     Patient location during evaluation: PACU Anesthesia Type: General Level of consciousness: awake and alert Pain management: pain level controlled Vital Signs Assessment: post-procedure vital signs reviewed and stable Respiratory status: spontaneous breathing, nonlabored ventilation and respiratory function stable Cardiovascular status: blood pressure returned to baseline and stable Postop Assessment: no apparent nausea or vomiting Anesthetic complications: no   No notable events documented.  Last Vitals:  Vitals:   01/16/21 1300 01/16/21 1315  BP: (!) 134/96 122/88  Pulse: 72 70  Resp: 18 18  Temp:    SpO2: 100% 99%    Last Pain:  Vitals:   01/16/21 1300  TempSrc:   PainSc: 5                  Candra R Senta Kantor

## 2021-01-16 NOTE — H&P (Signed)
Bailey Greene is an 60 y.o. female.   Chief Complaint: hernia HPI: Bailey Greene is a 60 yo female with a symptomatic umbilical hernia. It causes her pain and discomfort, particularly with activities. She presents today for repair. Xarelto was stopped last month by her PCP. Cardiology recommended no further preop workup.   Past Medical History:  Diagnosis Date   Anxiety    Bipolar affective disorder ([place])    CAD (coronary artery disease) 2006   CABG w/ LIMA-LAD, RIMA-Diag, SVG-OM1-OM2, R radial-PDA   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   Qualifier: Diagnosis of  By: Garen Grams     Diabetes mellitus type 2 in nonobese ([place]) 07/2016   Dyslipidemia    Headache(784.0)    HTN (hypertension)    Migraine    Pulmonary embolus ([place])    unprovoked    Past Surgical History:  Procedure Laterality Date   BUNIONECTOMY  08/2011   right foot   CARDIAC CATHETERIZATION  2007   severe native 3 v dz, all grafts patent (LIMA-LAD, RIMA-Diag, SVG-OM1-OM2, R radial-PDA)   [facility]   CORONARY ARTERY BYPASS GRAFT  2006    Coronary artery bypass grafting x5 with a left  internal  mammary to the left anterior descending coronary artery.  Free right  internal mammary to the diagonal coronary artery, sequential reverse  saphenous vein graft to the first and second obtuse marginal, right  artery bypass to the posterior descending coronary artery with endo-vein harvesting.    Family History  Problem Relation Age of Onset   Lupus Mother    Heart attack Father    Diabetes Father    Hypertension Father    Diabetes Paternal Grandmother    Hypertension Paternal Grandmother    Stroke Neg Hx    Kidney disease Neg Hx    Hyperlipidemia Neg Hx    Sudden death Neg Hx    Social History:  reports that she has been smoking cigarettes. She has a 12.50 pack-year smoking history. She has never used smokeless tobacco. She reports that she does not drink alcohol and does not use drugs.  Allergies: No Known  Allergies  Medications Prior to Admission  Medication Sig Dispense Refill   ALPRAZolam (XANAX) 0.5 MG tablet TAKE 2 TABLETS BY MOUTH TWICE A DAY AS NEEDED FOR ANXIETY (Patient taking differently: Take 1 mg by mouth 2 (two) times daily as needed for anxiety. TAKE 2 TABLETS BY MOUTH TWICE A DAY AS NEEDED FOR ANXIETY) 60 tablet 2   busPIRone (BUSPAR) 30 MG tablet TAKE 1 TABLET BY MOUTH TWICE A DAY 180 tablet 0   divalproex (DEPAKOTE ER) 500 MG 24 hr tablet Take 3 tablets (1,500 mg total) by mouth at bedtime. 270 tablet 0   doxycycline (VIBRA-TABS) 100 MG tablet Take 1 tablet (100 mg total) by mouth 2 (two) times daily. 20 tablet 0   Insulin Glargine (BASAGLAR KWIKPEN) 100 UNIT/ML Inject 12 Units into the skin daily. 9 mL 3   JANUVIA 50 MG tablet TAKE 1 TABLET BY MOUTH EVERY DAY 90 tablet 1   metoprolol succinate (TOPROL-XL) 25 MG 24 hr tablet Take 25 mg by mouth daily.     OLANZapine (ZYPREXA) 10 MG tablet TAKE 1 TABLET BY MOUTH EVERYDAY AT BEDTIME 90 tablet 0   PARoxetine (PAXIL) 20 MG tablet TAKE 2 TABLETS BY MOUTH EVERY DAY (Patient taking differently: Take 40 mg by mouth daily.) 180 tablet 0   simvastatin (ZOCOR) 80 MG tablet Take 80 mg by mouth at bedtime.  1 Tablet at Bedtime     XIIDRA 5 % SOLN Place 1 drop into both eyes in the morning and at bedtime.     Accu-Chek Softclix Lancets lancets USE UP TO FOUR TIMES DAILY AS DIRECTED 200 each 12   blood glucose meter kit and supplies KIT Dispense based on patient and insurance preference. Use up to four times daily as directed. (FOR ICD-9 250.00, 250.01). 1 each 0   insulin glargine (LANTUS) 100 UNIT/ML Solostar Pen INJECT 10 UNITS INTO THE SKIN DAILY. (Patient not taking: No sig reported) 9 mL 0   Insulin Pen Needle (PEN NEEDLES) 33G X 4 MM MISC 1 Units by Does not apply route daily. 100 each 1   metoprolol succinate (TOPROL-XL) 50 MG 24 hr tablet Take 1 tablet (50 mg total) by mouth daily. (Patient not taking: Reported on 01/01/2021) 90 tablet 3     Results for orders placed or performed during the hospital encounter of 01/16/21 (from the past 48 hour(s))  Glucose, capillary     Status: Abnormal   Collection Time: 01/16/21  8:56 AM  Result Value Ref Range   Glucose-Capillary 143 (H) 70 - 99 mg/dL    Comment: Glucose reference range applies only to samples taken after fasting for at least 8 hours.  Glucose, capillary     Status: Abnormal   Collection Time: 01/16/21 11:00 AM  Result Value Ref Range   Glucose-Capillary 121 (H) 70 - 99 mg/dL    Comment: Glucose reference range applies only to samples taken after fasting for at least 8 hours.   Comment 1 Notify RN    No results found.  Review of Systems  Blood pressure 125/76, pulse 80, temperature 98.2 F (36.8 C), temperature source Oral, resp. rate 18, height '5\' 5"'  (1.651 m), weight 63.5 kg, last menstrual period 11/28/2009, SpO2 100 %. Physical Exam Vitals reviewed.  Constitutional:      Appearance: Normal appearance.  HENT:     Head: Normocephalic and atraumatic.  Eyes:     General: No scleral icterus.    Conjunctiva/sclera: Conjunctivae normal.  Pulmonary:     Effort: Pulmonary effort is normal. No respiratory distress.  Abdominal:     General: There is no distension.     Palpations: Abdomen is soft.     Comments: Small supraumbilical hernia  Musculoskeletal:        General: Normal range of motion.  Skin:    General: Skin is warm and dry.  Neurological:     General: No focal deficit present.     Mental Status: She is alert and oriented to person, place, and time.  Psychiatric:        Mood and Affect: Mood normal.        Behavior: Behavior normal.        Thought Content: Thought content normal.     Assessment/Plan 60 yo female with umbilical hernia. Proceed to OR for surgical repair with possible mesh placement. Informed consent obtained. Plan for discharge home from PACU.  Dwan Bolt, MD 01/16/2021, 11:04 AM

## 2021-01-16 NOTE — Transfer of Care (Signed)
Immediate Anesthesia Transfer of Care Note  Patient: Bailey Greene  Procedure(s) Performed: UMBILICAL  HERNIA REPAIR (Abdomen)  Patient Location: PACU  Anesthesia Type:General  Level of Consciousness: drowsy and patient cooperative  Airway & Oxygen Therapy: Patient Spontanous Breathing and Patient connected to face mask oxygen  Post-op Assessment: Report given to RN and Post -op Vital signs reviewed and stable  Post vital signs: Reviewed  Last Vitals:  Vitals Value Taken Time  BP 129/78 01/16/21 1231  Temp    Pulse 68 01/16/21 1233  Resp 16 01/16/21 1233  SpO2 100 % 01/16/21 1233  Vitals shown include unvalidated device data.  Last Pain:  Vitals:   01/16/21 0920  TempSrc:   PainSc: 0-No pain         Complications: No notable events documented.

## 2021-01-19 ENCOUNTER — Encounter (HOSPITAL_COMMUNITY): Payer: Self-pay | Admitting: Surgery

## 2021-01-30 ENCOUNTER — Ambulatory Visit: Payer: 59 | Admitting: Family

## 2021-02-04 ENCOUNTER — Other Ambulatory Visit: Payer: Self-pay | Admitting: Cardiology

## 2021-02-04 ENCOUNTER — Other Ambulatory Visit: Payer: Self-pay | Admitting: Psychiatry

## 2021-02-04 DIAGNOSIS — F4001 Agoraphobia with panic disorder: Secondary | ICD-10-CM

## 2021-02-04 DIAGNOSIS — F5105 Insomnia due to other mental disorder: Secondary | ICD-10-CM

## 2021-02-04 DIAGNOSIS — F3162 Bipolar disorder, current episode mixed, moderate: Secondary | ICD-10-CM

## 2021-02-04 DIAGNOSIS — F411 Generalized anxiety disorder: Secondary | ICD-10-CM

## 2021-02-10 ENCOUNTER — Encounter: Payer: Self-pay | Admitting: Family

## 2021-02-10 ENCOUNTER — Other Ambulatory Visit: Payer: Self-pay

## 2021-02-10 ENCOUNTER — Ambulatory Visit (INDEPENDENT_AMBULATORY_CARE_PROVIDER_SITE_OTHER): Payer: 59 | Admitting: Family

## 2021-02-10 VITALS — BP 127/78 | HR 80 | Temp 98.8°F | Resp 16 | Wt 143.0 lb

## 2021-02-10 DIAGNOSIS — F411 Generalized anxiety disorder: Secondary | ICD-10-CM

## 2021-02-10 DIAGNOSIS — Z23 Encounter for immunization: Secondary | ICD-10-CM | POA: Diagnosis not present

## 2021-02-10 DIAGNOSIS — E119 Type 2 diabetes mellitus without complications: Secondary | ICD-10-CM

## 2021-02-10 DIAGNOSIS — I10 Essential (primary) hypertension: Secondary | ICD-10-CM | POA: Diagnosis not present

## 2021-02-10 DIAGNOSIS — E785 Hyperlipidemia, unspecified: Secondary | ICD-10-CM | POA: Diagnosis not present

## 2021-02-10 DIAGNOSIS — Z86711 Personal history of pulmonary embolism: Secondary | ICD-10-CM

## 2021-02-10 MED ORDER — ASPIRIN 81 MG PO TBEC: 81.0000 mg | DELAYED_RELEASE_TABLET | Freq: Every day | ORAL | 12 refills | Status: DC

## 2021-02-10 NOTE — Assessment & Plan Note (Signed)
>>  ASSESSMENT AND PLAN FOR ESSENTIAL HYPERTENSION WRITTEN ON 02/10/2021 11:57 AM BY O'SULLIVAN, Daxx Tiggs, NP  BP Readings from Last 3 Encounters:  02/10/21 127/78  01/16/21 122/88  01/07/21 125/85   BP is stable. Continue Toprol  xl 25mg .

## 2021-02-10 NOTE — Assessment & Plan Note (Signed)
Completed course of Eliquis. Advised pt to add aspirin 81mg  once daily.

## 2021-02-10 NOTE — Progress Notes (Signed)
 Subjective:   By signing my name below, I, [name], attest that this documentation has been prepared under the direction and in the presence of  O'Sullivan, NP, 02/10/2021   Patient ID: Bailey Greene, female    DOB: 12/26/1960, 60 y.o.   MRN: [id number]  Chief Complaint  Patient presents with   Diabetes    Here for follow up   Anxiety    Patient reports increased anxiety    HPI Patient is in today for an office visit.   Blood Sugar: Her blood sugar levels have been stabilizing and she shows a positive attitude to lower it even more. Her last A1C was 6.8 which met the goal of being under 7.  Vaccinations: She is interested in getting the second Shingrix vaccine, the pneumonia booster and flu shot during this visit. She is interested in receiving the updated Covid-19 booster when it becomes available.  Vision: She has a vision appointment next month and will have results sent to the office once they are obtained.  Medications: She has been compliant with her 25 mg metoprolol and 50 mg januvia. She has stopped taking her blood thinner xarelto and has been advised to not continue.   Health Maintenance Due  Topic Date Due   OPHTHALMOLOGY EXAM  Never done   Pneumococcal Vaccine 0-64 Years old (2 - PCV) 08/13/2017    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   Qualifier: Diagnosis of  By: Alleyne, Tiffany     Diabetes mellitus type 2 in nonobese ([facility]) 07/2016   Dyslipidemia    Headache(784.0)    History of pulmonary embolism 07/08/2020   HTN (hypertension)    Migraine    Pulmonary embolus ([facility])    unprovoked    Past Surgical History:  Procedure Laterality Date   BUNIONECTOMY  08/2011   right foot   CARDIAC CATHETERIZATION  2007   severe native 3 v dz, all grafts patent (LIMA-LAD,  RIMA-Diag, SVG-OM1-OM2, R radial-PDA)   CESAREAN SECTION  1984   CORONARY ARTERY BYPASS GRAFT  2006    Coronary artery bypass grafting x5 with a left  internal  mammary to the left anterior descending coronary artery.  Free right  internal mammary to the diagonal coronary artery, sequential reverse  saphenous vein graft to the first and second obtuse marginal, right  artery bypass to the posterior descending coronary artery with endo-vein harvesting.   UMBILICAL HERNIA REPAIR N/A 01/16/2021   Procedure: UMBILICAL  HERNIA REPAIR;  Surgeon: Allen, Shelby L, MD;  Location: [facility] OR;  Service: General;  Laterality: N/A;    Family History  Problem Relation Age of Onset   Lupus Mother    Heart attack Father    Diabetes Father    Hypertension Father    Diabetes Paternal Grandmother    Hypertension Paternal Grandmother    Stroke Neg Hx    Kidney disease Neg Hx    Hyperlipidemia Neg Hx    Sudden death Neg Hx     Social History   Socioeconomic History   Marital status: Legally Separated    Spouse name: Not on file   Number of children: Not on file   Years of education: Not on file   Highest education level: Not on file  Occupational History   Occupation: mortgage loan specialist    Tobacco Use   Smoking status: Some Days    Packs/day: 0.50    Years: 25.00    Pack years: 12.50    Types: Cigarettes   Smokeless tobacco: Never  Vaping Use   Vaping Use: Never used  Substance and Sexual Activity   Alcohol use: No    Alcohol/week: 0.0 standard drinks   Drug use: Never   Sexual activity: Yes    Partners: Male    Comment: married  Other Topics Concern   Not on file  Social History Narrative   Regular exercise:  3 days weekly   Caffeine Use:  1 soda daily   One child biological daughter born in 1984 and an adopted niece.   [employer]- Mortgage specialist   Married- may be divorcing.          Social Determinants of Health   Financial Resource Strain: Not on file  Food  Insecurity: Not on file  Transportation Needs: Not on file  Physical Activity: Not on file  Stress: Not on file  Social Connections: Not on file  Intimate Partner Violence: Not on file    Outpatient Medications Prior to Visit  Medication Sig Dispense Refill   Accu-Chek Softclix Lancets lancets USE UP TO FOUR TIMES DAILY AS DIRECTED 200 each 12   ALPRAZolam (XANAX) 0.5 MG tablet TAKE 2 TABLETS BY MOUTH TWICE A DAY AS NEEDED FOR ANXIETY (Patient taking differently: Take 1 mg by mouth 2 (two) times daily as needed for anxiety. TAKE 2 TABLETS BY MOUTH TWICE A DAY AS NEEDED FOR ANXIETY) 60 tablet 2   blood glucose meter kit and supplies KIT Dispense based on patient and insurance preference. Use up to four times daily as directed. (FOR ICD-9 250.00, 250.01). 1 each 0   busPIRone (BUSPAR) 30 MG tablet TAKE 1 TABLET BY MOUTH TWICE A DAY 60 tablet 0   divalproex (DEPAKOTE ER) 500 MG 24 hr tablet Take 3 tablets (1,500 mg total) by mouth at bedtime. 270 tablet 0   Insulin Glargine (BASAGLAR KWIKPEN) 100 UNIT/ML Inject 12 Units into the skin daily. 9 mL 3   insulin glargine (LANTUS) 100 UNIT/ML Solostar Pen INJECT 10 UNITS INTO THE SKIN DAILY. 9 mL 0   Insulin Pen Needle (PEN NEEDLES) 33G X 4 MM MISC 1 Units by Does not apply route daily. 100 each 1   JANUVIA 50 MG tablet TAKE 1 TABLET BY MOUTH EVERY DAY 90 tablet 1   metoprolol succinate (TOPROL-XL) 25 MG 24 hr tablet TAKE 1 TABLET BY MOUTH EVERY DAY 90 tablet 3   OLANZapine (ZYPREXA) 10 MG tablet TAKE 1 TABLET BY MOUTH EVERYDAY AT BEDTIME 30 tablet 0   oxyCODONE (OXY IR/ROXICODONE) 5 MG immediate release tablet Take 1 tablet (5 mg total) by mouth every 6 (six) hours as needed for severe pain. 15 tablet 0   PARoxetine (PAXIL) 20 MG tablet TAKE 2 TABLETS BY MOUTH EVERY DAY 60 tablet 0   simvastatin (ZOCOR) 80 MG tablet Take 80 mg by mouth at bedtime. 1 Tablet at Bedtime     XIIDRA 5 % SOLN Place 1 drop into both eyes in the morning and at bedtime.      doxycycline (VIBRA-TABS) 100 MG tablet Take 1 tablet (100 mg total) by mouth 2 (two) times daily. 20 tablet 0   metoprolol succinate (TOPROL-XL) 50 MG 24 hr tablet Take 1 tablet (50 mg total) by mouth daily. (Patient not taking: Reported on 01/01/2021) 90 tablet 3   No   facility-administered medications prior to visit.    No Known Allergies  ROS    See HPI  Objective:    Physical Exam Constitutional:      General: She is not in acute distress.    Appearance: Normal appearance. She is not ill-appearing.  HENT:     Head: Normocephalic and atraumatic.     Right Ear: External ear normal.     Left Ear: External ear normal.  Eyes:     Extraocular Movements: Extraocular movements intact.     Pupils: Pupils are equal, round, and reactive to light.  Cardiovascular:     Rate and Rhythm: Normal rate and regular rhythm.     Heart sounds: Normal heart sounds. No murmur heard.   No gallop.  Pulmonary:     Effort: Pulmonary effort is normal. No respiratory distress.     Breath sounds: Normal breath sounds. No wheezing or rales.  Skin:    General: Skin is warm and dry.  Neurological:     Mental Status: She is alert and oriented to person, place, and time.  Psychiatric:        Behavior: Behavior normal.        Judgment: Judgment normal.    BP 127/78 (BP Location: Right Arm, Patient Position: Sitting, Cuff Size: Small)   Pulse 80   Temp 98.8 F (37.1 C) (Oral)   Resp 16   Wt 143 lb (64.9 kg)   LMP 11/28/2009   SpO2 100%   BMI 23.80 kg/m  Wt Readings from Last 3 Encounters:  02/10/21 143 lb (64.9 kg)  01/16/21 140 lb (63.5 kg)  01/07/21 140 lb 12.8 oz (63.9 kg)       Assessment & Plan:   Problem List Items Addressed This Visit       Unprioritized   Hyperlipidemia with target LDL less than 70    Lab Results  Component Value Date   CHOL 139 07/17/2020   HDL 58 07/17/2020   LDLCALC 65 07/17/2020   LDLDIRECT 145.2 07/18/2008   TRIG 80 07/17/2020   CHOLHDL 2.4 07/17/2020   LDL at goal. Continue simvastatin 77m.       Relevant Medications   aspirin 81 MG EC tablet   History of pulmonary embolism    Completed course of Eliquis. Advised pt to add aspirin 830monce daily.       Essential hypertension    BP Readings from Last 3 Encounters:  02/10/21 127/78  01/16/21 122/88  01/07/21 125/85  BP is stable. Continue Toprol xl 2536m      Relevant Medications   aspirin 81 MG EC tablet   [facility]  Lab Results  Component Value Date   HGBA1C 6.8 (H) 01/07/2021   HGBA1C 7.8 (H) 10/29/2020   HGBA1C 13.8 (H) 07/08/2020   Lab Results  Component Value Date   MICROALBUR 0.4 07/18/2020   LDLCALC 65 07/17/2020   CREATININE 0.96 01/07/2021  A1C has come down considerably and is now at goal. Recommended that she continue current dose of basaglar and janTonga     Relevant Medications   aspirin 81 MG EC tablet   Anxiety state    Fair control. She is followed by psychiatry.       Other Visit Diagnoses     Need for shingles vaccine    -  Primary   Relevant Orders   Varicella-zoster vaccine IM (Shingrix) (Completed)   Need for pneumococcal vaccine  Relevant Orders   Pneumococcal conjugate vaccine 20-valent (Prevnar 20) (Completed)   Needs flu shot       Relevant Orders   Flu Vaccine QUAD 6+ mos PF IM (Fluarix Quad PF) (Completed)       Meds ordered this encounter  Medications   aspirin 81 MG EC tablet    Sig: Take 1 tablet (81 mg total) by mouth daily. Swallow whole.    Dispense:  30 tablet    Refill:  12    Order Specific Question:   Supervising Provider    Answer:   Mosie Lukes [[id number]]    I, [name], NP, personally preformed the services described in this documentation.  All medical record entries made by the scribe were at my direction and in my presence.  I have reviewed the chart and discharge instructions (if applicable) and agree that the record reflects my personal performance  and is accurate and complete. 02/10/2021   I,[name],acting as a Education administrator for Nance Pear, NP.,have documented all relevant documentation on the behalf of Nance Pear, NP,as directed by  Nance Pear, NP while in the presence of Nance Pear, NP.    Nance Pear, NP

## 2021-02-10 NOTE — Assessment & Plan Note (Signed)
Fair control. She is followed by psychiatry.

## 2021-02-10 NOTE — Assessment & Plan Note (Signed)
Lab Results  Component Value Date   CHOL 139 07/17/2020   HDL 58 07/17/2020   LDLCALC 65 07/17/2020   LDLDIRECT 145.2 07/18/2008   TRIG 80 07/17/2020   CHOLHDL 2.4 07/17/2020   LDL at goal. Continue simvastatin 80mg .

## 2021-02-10 NOTE — Assessment & Plan Note (Addendum)
Lab Results  Component Value Date   HGBA1C 6.8 (H) 01/07/2021   HGBA1C 7.8 (H) 10/29/2020   HGBA1C 13.8 (H) 07/08/2020   Lab Results  Component Value Date   MICROALBUR 0.4 07/18/2020   LDLCALC 65 07/17/2020   CREATININE 0.96 01/07/2021   A1C has come down considerably and is now at goal. Recommended that she continue current dose of basaglar and [place].

## 2021-02-10 NOTE — Assessment & Plan Note (Signed)
BP Readings from Last 3 Encounters:  02/10/21 127/78  01/16/21 122/88  01/07/21 125/85   BP is stable. Continue Toprol xl 25mg .

## 2021-02-10 NOTE — Patient Instructions (Signed)
Please add aspirin 81mg once daily.  

## 2021-02-18 ENCOUNTER — Ambulatory Visit (INDEPENDENT_AMBULATORY_CARE_PROVIDER_SITE_OTHER): Payer: 59 | Admitting: Psychiatry

## 2021-02-18 ENCOUNTER — Encounter: Payer: Self-pay | Admitting: Psychiatry

## 2021-02-18 ENCOUNTER — Other Ambulatory Visit: Payer: Self-pay

## 2021-02-18 DIAGNOSIS — F3162 Bipolar disorder, current episode mixed, moderate: Secondary | ICD-10-CM

## 2021-02-18 DIAGNOSIS — F4001 Agoraphobia with panic disorder: Secondary | ICD-10-CM

## 2021-02-18 DIAGNOSIS — F411 Generalized anxiety disorder: Secondary | ICD-10-CM | POA: Diagnosis not present

## 2021-02-18 DIAGNOSIS — F5105 Insomnia due to other mental disorder: Secondary | ICD-10-CM | POA: Diagnosis not present

## 2021-02-18 NOTE — Progress Notes (Signed)
Bailey Greene [id number] 01/14/1961 60 y.o.   Subjective:   Patient ID:  Bailey Greene is a 60 y.o. (DOB [birth year]) female.  Chief Complaint:  Chief Complaint  Patient presents with   Follow-up   bipolar I disorder    Depression   Anxiety    Anxiety Symptoms include nervous/anxious behavior. Patient reports no chest pain, decreased concentration, dizziness, nausea or palpitations.    Bailey Greene presents for follow-up of bipolar disorder and panic attacks and general anxiety.  visit 5/22,2020.  Increased Depakote then to 1500 mg daily.  Boss rec LOA for 4 weeks.  Going through separation is really tough.  Panic attacks at work interfering.  Had this job 10 years.  Likes the job but hard to concentrate and stay focused.  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  Panic incr pulse and SOB, fear, sweating and shakey.  Panic lasts 20 mins and increased frequency.  Several in a day.  Going on for 2 mos but getting worse.  Boss can tell from listening to her calls and drop in production.  At follow up visit November 22, 2018.  Mood swings are better.  Trouble staying asleep.  Caffeine 1 coffee and 1 soda daily.She was still having severe anxiety plus panic.  We discussed the risk of SSRIs trickling triggering mood swings but the severity of the anxiety was such that we decided to initiate fluoxetine 10 mg daily to increase to 20 mg daily.  We also were using low-dose mirtazapine to help with sleep.  seen January 18, 2019.  She was granted medical leave for panic symptoms.  She was switched to paroxetine for panic from fluoxetine.  Since she was switched from mirtazapine to quetiapine 25 to 50 mg nightly for insomnia.   November 2020 visit with the following noted: It is helping some with anxiety but a lot of stress.  No unusual mood swings without a change.   She's satisfied with the 20 mg paroxetine. Sleep is much better with quetiapine and xanax at night.  5 hour and sometimes  better depending on work schedule.   No meds were changed.    10/23/2019 visit with the following noted: Anxiety, dep, panic attacks all worse lately for 2 mos.  Anger also worse mostly just at home bc manages it at work.  Several triggers.  Sister passed March 26 after illness.  Work and daughter are stressful.  Overwhelming. Not as good staying asleep.  Random panic.  Feels SOB.  Wanting to isolate. Spontaneous crying spell.s Poor concentration affecting work.    Plan: Increase paroxetine to 1-1/2 of the 20 mg tablets daily For sleep increase quetiapine to 2 the 25 mg tablets nightly  03/05/20 appt with following noted: Less anxious and sleeping is better.  Panic can be triggered at work with SOB and heart racing.  Happens about 2 times weekly. Main stress is work is overwhelming.  Talk with customers all day.   No clear mania.   Still some depression too. No SE Plan: no med changes except increase paroxetine to 40 for anxiety  09/02/2020 appt noted: Dx DM since here. Anxiety and panic worse since January.  Consistent with meds.  Stress dx DM.  Sister passed away.  Panic with SOB and occ sweats.  Panic daily. Usually before noon usually with trigger. Xanax makes her relax. 1 cup coffee AM.   Sleep 4-5 hours with quetiapine 25 mg.   And pretty consistent. Mood feels labile.  About to lose  it including irritable and angry.   Plan: Increase Quetiapine 25 mg 2 mg HS.   12/10/20 appt noted: Dizziniess for awhile after paroxetine resolved. Still having anxiety and panic but not daily.   Average 7/10 anxiety usually triggered with work as primary stress.  She and H still dealing with problems.  Panic worse either in the AM or evening. Sleep is better 4-5 hours nightly. Taking quetiapine 50 mg with Xanax at night. Still on Depakote ER 1500, busipirone 30 BID , paroxetine 40 mg daily No anger problems at work.  Appetite is better.  Regaining some weight. Evening walks helps mental health. Plan:  Start olanzapine 5 mg daily and if that is not sufficient within a week increase to 10 mg nightly.  We can go higher if needed and call if necessary. BC failure of alternatives, alprazolam 0.5 mg AM before work.   02/18/2021 appointment with the following noted: Anx down to 4-5/10.  Notices calmer with Xanax and throughout the day.Less anxiety and panic at work but getting better. Depression about the same 6/10.  Tries to distract herself from problems at home.. Sleep 6 hours now with olanzapine.   No SE  PDMP only shows Xanax.  She denies abusing substances  Past Psychiatric Medication Trials: Hydroxyzine, mirtazapine poor response for sleep,  Depakote,  Xanax, buspirone,  duloxetine, citalopram, Wellbutrin, fluoxetine, paroxetine  quetiapine low-dose for sleep Olanzapine 5  Notes and chart were reviewed with the patient regarding prior history and symptoms.  Review of Systems:  Review of Systems  Cardiovascular:  Negative for chest pain and palpitations.  Gastrointestinal:  Negative for diarrhea and nausea.  Endocrine: Negative for polyuria.  Neurological:  Negative for dizziness, tremors and weakness.  Psychiatric/Behavioral:  Positive for dysphoric mood. Negative for decreased concentration and sleep disturbance. The patient is nervous/anxious.    Medications: I have reviewed the patient's current medications.  Current Outpatient Medications  Medication Sig Dispense Refill   Accu-Chek Softclix Lancets lancets USE UP TO FOUR TIMES DAILY AS DIRECTED 200 each 12   ALPRAZolam (XANAX) 0.5 MG tablet TAKE 2 TABLETS BY MOUTH TWICE A DAY AS NEEDED FOR ANXIETY (Patient taking differently: Take 1 mg by mouth 2 (two) times daily as needed for anxiety. TAKE 2 TABLETS BY MOUTH TWICE A DAY AS NEEDED FOR ANXIETY) 60 tablet 2   aspirin 81 MG EC tablet Take 1 tablet (81 mg total) by mouth daily. Swallow whole. 30 tablet 12   blood glucose meter kit and supplies KIT Dispense based on patient  and insurance preference. Use up to four times daily as directed. (FOR ICD-9 250.00, 250.01). 1 each 0   busPIRone (BUSPAR) 30 MG tablet TAKE 1 TABLET BY MOUTH TWICE A DAY 60 tablet 0   divalproex (DEPAKOTE ER) 500 MG 24 hr tablet Take 3 tablets (1,500 mg total) by mouth at bedtime. 270 tablet 0   Insulin Glargine (BASAGLAR KWIKPEN) 100 UNIT/ML Inject 12 Units into the skin daily. 9 mL 3   insulin glargine (LANTUS) 100 UNIT/ML Solostar Pen INJECT 10 UNITS INTO THE SKIN DAILY. 9 mL 0   Insulin Pen Needle (PEN NEEDLES) 33G X 4 MM MISC 1 Units by Does not apply route daily. 100 each 1   JANUVIA 50 MG tablet TAKE 1 TABLET BY MOUTH EVERY DAY 90 tablet 1   metoprolol succinate (TOPROL-XL) 25 MG 24 hr tablet TAKE 1 TABLET BY MOUTH EVERY DAY 90 tablet 3   OLANZapine (ZYPREXA) 10 MG tablet TAKE 1 TABLET  BY MOUTH EVERYDAY AT BEDTIME 30 tablet 0   PARoxetine (PAXIL) 20 MG tablet TAKE 2 TABLETS BY MOUTH EVERY DAY 60 tablet 0   simvastatin (ZOCOR) 80 MG tablet Take 80 mg by mouth at bedtime. 1 Tablet at Bedtime     XIIDRA 5 % SOLN Place 1 drop into both eyes in the morning and at bedtime.     oxyCODONE (OXY IR/ROXICODONE) 5 MG immediate release tablet Take 1 tablet (5 mg total) by mouth every 6 (six) hours as needed for severe pain. (Patient not taking: Reported on 02/18/2021) 15 tablet 0   No current facility-administered medications for this visit.    Medication Side Effects: ? 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   Qualifier: Diagnosis of  By: Garen Grams     Diabetes mellitus type 2 in nonobese ([place]) 07/2016   Dyslipidemia    Headache(784.0)    History of pulmonary embolism 07/08/2020   HTN (hypertension)    Migraine    Pulmonary embolus ([facility])    unprovoked     Family History  Problem Relation Age of Onset   Lupus Mother    Heart attack Father    Diabetes Father    Hypertension Father    Diabetes Paternal Grandmother    Hypertension Paternal Grandmother    Stroke Neg Hx    Kidney disease Neg Hx    Hyperlipidemia Neg Hx    Sudden death Neg Hx     Social History   Socioeconomic History   Marital status: Legally Separated    Spouse name: Not on file   Number of children: Not on file   Years of education: Not on file   Highest education level: Not on file  Occupational History   Occupation: mortgage loan specialist  Tobacco Use   Smoking status: Some Days    Packs/day: 0.50    Years: 25.00    Pack years: 12.50    Types: Cigarettes   Smokeless tobacco: Never  Vaping Use   Vaping Use: Never used  Substance and Sexual Activity   Alcohol use: No    Alcohol/week: 0.0 standard drinks   Drug use: Never   Sexual activity: Yes    Partners: Male    Comment: married  Other Topics Concern   Not on file  Social History Narrative   Regular exercise:  3 days weekly   Caffeine Use:  1 soda daily   One child biological daughter born in 21 and an adopted niece.   [place] specialist   Married- may be divorcing.          Social Determinants of Health   Financial Resource Strain: Not on file  Food Insecurity: Not on file  Transportation Needs: Not on file  Physical Activity: Not on file  Stress: Not on file  Social Connections: Not on file  Intimate Partner Violence: Not on file    Past Medical History, Surgical history, Social history, and Family history were reviewed and updated as appropriate.   Please see review of systems for further details on the patient's review from today.   Objective:   Physical Exam:  LMP 11/28/2009   Physical Exam Constitutional:      General: She is not in  acute distress.    Appearance: She is well-developed.  Musculoskeletal:        General: No deformity.   Neurological:     Mental Status: She is alert and oriented to person, place, and time.     Coordination: Coordination normal.  Psychiatric:        Attention and Perception: She is attentive. She does not perceive auditory hallucinations.        Mood and Affect: Mood is anxious and depressed. Affect is not labile, blunt, angry or tearful.        Speech: Speech normal. Speech is not rapid and pressured.        Behavior: Behavior normal. Behavior is not agitated.        Thought Content: Thought content normal. Thought content does not include homicidal or suicidal ideation. Thought content does not include homicidal or suicidal plan.        Cognition and Memory: Cognition normal.        Judgment: Judgment normal.     Comments: Insight intact. No auditory or visual hallucinations. No delusions.  Improved not resolved    Lab Review:     Component Value Date/Time   NA 138 01/07/2021 1620   NA 141 07/23/2020 1300   K 4.0 01/07/2021 1620   CL 103 01/07/2021 1620   CO2 29 01/07/2021 1620   GLUCOSE 79 01/07/2021 1620   BUN 11 01/07/2021 1620   BUN 7 07/23/2020 1300   CREATININE 0.96 01/07/2021 1620   CREATININE 0.72 06/22/2012 0840   CALCIUM 9.8 01/07/2021 1620   PROT 6.6 10/29/2020 1017   PROT 5.8 (L) 07/17/2020 1009   ALBUMIN 4.5 10/29/2020 1017   ALBUMIN 3.6 (L) 07/17/2020 1009   AST 12 10/29/2020 1017   ALT 9 10/29/2020 1017   ALKPHOS 95 10/29/2020 1017   BILITOT 0.5 10/29/2020 1017   BILITOT 0.3 07/17/2020 1009   GFRNONAA >60 01/07/2021 1620   GFRNONAA >89 06/22/2012 0840   GFRAA 102 07/23/2020 1300   GFRAA >89 06/22/2012 0840       Component Value Date/Time   WBC 9.0 01/07/2021 1620   RBC 4.46 01/07/2021 1620   HGB 13.2 01/07/2021 1620   HCT 39.7 01/07/2021 1620   PLT 194 01/07/2021 1620   MCV 89.0 01/07/2021 1620   MCH 29.6 01/07/2021 1620   MCHC 33.2 01/07/2021 1620   RDW 13.2 01/07/2021 1620   LYMPHSABS 2.7 06/22/2012 0840   MONOABS 0.5 06/22/2012 0840    EOSABS 0.1 06/22/2012 0840   BASOSABS 0.0 06/22/2012 0840    No results found for: POCLITH, LITHIUM   No results found for: PHENYTOIN, PHENOBARB, VALPROATE, CBMZ   .res Assessment: Plan:    Bailey Greene was seen today for follow-up, bipolar i disorder , depression and anxiety.  Diagnoses and all orders for this visit:  Panic disorder with agoraphobia  Generalized anxiety disorder  Moderate mixed bipolar I disorder ([place])  Insomnia due to mental condition  Greater than 50% of face to face time with patient was spent on counseling and coordination of care. We discussed the following: Bailey Greene has a little less uncontrolled mood swings throughout the week with rapid very rapid ultra-rapid cycling since the increase in Depakote to 1500 mg.  She also describes ongoing panic attacks some of which are precipitated by chronic ongoing stress and some occur out of the blue.  Her symptoms affect her both at home and at work.  Her symptoms have improved enough that they are not  adversely affecting work function at this time.  She had had a brief leave of absence from work at the last visit but she is working now.  Unfortunately her anxiety still remains high.  She feels chronically stressed and is not getting enough sleep despite an increase in quetiapine but is improved with switch from quetiapine to olanzapine 10 mg which is as a better antianxiety effect and still has some benefit for sleep.  It is also a mood stabilizer.  It also can help with depression when added to an SSRI.  We discussed side effects in detail. Increase olanzapine 15 mg HS.  BC failure of alternatives, alprazolam 0.5 mg AM before work.  Alternatives clonazepam, Ativan  Because of the severity of the panic continue paroxetine to 40 mg at night.  It was explained to her this could worsen her mood cycling and to let us know if that occurs.  Fortunately because no mood swings and her panic and anxiety symptoms are improved but  not controlled.    Discussed potential metabolic side effects associated with atypical antipsychotics, as well as potential risk for movement side effects. Advised pt to contact office if movement side effects occur.  Metabolic side effects are unlikely at this low dose of quetiapine and almost no risk of EPS at this low-dose.   We discussed the short-term risks associated with benzodiazepines including sedation and increased fall risk among others.  Discussed long-term side effect risk including dependence, potential withdrawal symptoms, and the potential eventual dose-related risk of dementia.   FMLA same as last year.  This appt was 30 mins.   Follow-up 1 mos  Lynder Parents MD, DFAPA  Please see After Visit Summary for patient specific instructions.  As soon as convenient, get the blood test in the morning.  No fasting is required.  Future Appointments  Date Time Provider [place]  03/02/2021  2:30 PM Cottle, Billey Co., MD [facility] None  05/12/2021  4:00 PM [name], NP [facility]    No orders of the defined types were placed in this encounter.     -------------------------------

## 2021-02-25 ENCOUNTER — Encounter: Payer: Self-pay | Admitting: Family

## 2021-03-02 ENCOUNTER — Ambulatory Visit: Payer: 59 | Admitting: Psychiatry

## 2021-03-15 ENCOUNTER — Other Ambulatory Visit: Payer: Self-pay | Admitting: Psychiatry

## 2021-03-15 DIAGNOSIS — F411 Generalized anxiety disorder: Secondary | ICD-10-CM

## 2021-03-15 DIAGNOSIS — F4001 Agoraphobia with panic disorder: Secondary | ICD-10-CM

## 2021-03-30 ENCOUNTER — Other Ambulatory Visit: Payer: Self-pay | Admitting: Family

## 2021-03-30 LAB — HM DIABETES EYE EXAM

## 2021-04-09 ENCOUNTER — Other Ambulatory Visit: Payer: Self-pay | Admitting: Psychiatry

## 2021-04-09 DIAGNOSIS — F5105 Insomnia due to other mental disorder: Secondary | ICD-10-CM

## 2021-04-09 DIAGNOSIS — F4001 Agoraphobia with panic disorder: Secondary | ICD-10-CM

## 2021-04-09 DIAGNOSIS — F411 Generalized anxiety disorder: Secondary | ICD-10-CM

## 2021-04-17 ENCOUNTER — Other Ambulatory Visit: Payer: Self-pay | Admitting: Cardiology

## 2021-04-28 ENCOUNTER — Other Ambulatory Visit: Payer: Self-pay

## 2021-04-28 ENCOUNTER — Ambulatory Visit (INDEPENDENT_AMBULATORY_CARE_PROVIDER_SITE_OTHER): Payer: 59 | Admitting: Psychiatry

## 2021-04-28 ENCOUNTER — Encounter: Payer: Self-pay | Admitting: Psychiatry

## 2021-04-28 ENCOUNTER — Other Ambulatory Visit: Payer: Self-pay | Admitting: Psychiatry

## 2021-04-28 DIAGNOSIS — F5105 Insomnia due to other mental disorder: Secondary | ICD-10-CM | POA: Diagnosis not present

## 2021-04-28 DIAGNOSIS — F4001 Agoraphobia with panic disorder: Secondary | ICD-10-CM

## 2021-04-28 DIAGNOSIS — F411 Generalized anxiety disorder: Secondary | ICD-10-CM

## 2021-04-28 DIAGNOSIS — F3162 Bipolar disorder, current episode mixed, moderate: Secondary | ICD-10-CM

## 2021-04-28 MED ORDER — OLANZAPINE 20 MG PO TABS: 20.0000 mg | ORAL_TABLET | Freq: Every day | ORAL | 0 refills | Status: DC

## 2021-04-28 NOTE — Patient Instructions (Signed)
Increase olanzapine to 20 mg at 8 PM

## 2021-04-28 NOTE — Progress Notes (Signed)
Bailey Greene [id number] 05/11/1961 60 y.o.   Subjective:   Patient ID:  Bailey Greene is a 60 y.o. (DOB 1960-09-06) female.  Chief Complaint:  Chief Complaint  Patient presents with   Follow-up    Moderate mixed bipolar I disorder ([place])   Anxiety   Panic disorder with agoraphobia    Anxiety Symptoms include nervous/anxious behavior. Patient reports no chest pain, decreased concentration, dizziness, nausea or palpitations.    Bailey Greene presents for follow-up of bipolar disorder and panic attacks and general anxiety.  visit 5/22,2020.  Increased Depakote then to 1500 mg daily.  Boss rec LOA for 4 weeks.  Going through separation is really tough.  Panic attacks at work interfering.  Had this job 10 years.  Likes the job but hard to concentrate and stay focused.  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  Panic incr pulse and SOB, fear, sweating and shakey.  Panic lasts 20 mins and increased frequency.  Several in a day.  Going on for 2 mos but getting worse.  Boss can tell from listening to her calls and drop in production.  At follow up visit November 22, 2018.  Mood swings are better.  Trouble staying asleep.  Caffeine 1 coffee and 1 soda daily.She was still having severe anxiety plus panic.  We discussed the risk of SSRIs trickling triggering mood swings but the severity of the anxiety was such that we decided to initiate fluoxetine 10 mg daily to increase to 20 mg daily.  We also were using low-dose mirtazapine to help with sleep.  seen January 18, 2019.  She was granted medical leave for panic symptoms.  She was switched to paroxetine for panic from fluoxetine.  Since she was switched from mirtazapine to quetiapine 25 to 50 mg nightly for insomnia.   November 2020 visit with the following noted: It is helping some with anxiety but a lot of stress.  No unusual mood swings without a change.   She's satisfied with the 20 mg paroxetine. Sleep is much better with quetiapine  and xanax at night.  5 hour and sometimes better depending on work schedule.   No meds were changed.    10/23/2019 visit with the following noted: Anxiety, dep, panic attacks all worse lately for 2 mos.  Anger also worse mostly just at home bc manages it at work.  Several triggers.  Sister passed March 26 after illness.  Work and daughter are stressful.  Overwhelming. Not as good staying asleep.  Random panic.  Feels SOB.  Wanting to isolate. Spontaneous crying spell.s Poor concentration affecting work.    Plan: Increase paroxetine to 1-1/2 of the 20 mg tablets daily For sleep increase quetiapine to 2 the 25 mg tablets nightly  03/05/20 appt with following noted: Less anxious and sleeping is better.  Panic can be triggered at work with SOB and heart racing.  Happens about 2 times weekly. Main stress is work is overwhelming.  Talk with customers all day.   No clear mania.   Still some depression too. No SE Plan: no med changes except increase paroxetine to 40 for anxiety  09/02/2020 appt noted: Dx DM since here. Anxiety and panic worse since January.  Consistent with meds.  Stress dx DM.  Sister passed away.  Panic with SOB and occ sweats.  Panic daily. Usually before noon usually with trigger. Xanax makes her relax. 1 cup coffee AM.   Sleep 4-5 hours with quetiapine 25 mg.   And pretty consistent. Mood  feels labile.  About to lose it including irritable and angry.   Plan: Increase Quetiapine 25 mg 2 mg HS.   12/10/20 appt noted: Dizziniess for awhile after paroxetine resolved. Still having anxiety and panic but not daily.   Average 7/10 anxiety usually triggered with work as primary stress.  She and H still dealing with problems.  Panic worse either in the AM or evening. Sleep is better 4-5 hours nightly. Taking quetiapine 50 mg with Xanax at night. Still on Depakote ER 1500, busipirone 30 BID , paroxetine 40 mg daily No anger problems at work.  Appetite is better.  Regaining some  weight. Evening walks helps mental health. Plan: Start olanzapine 5 mg daily and if that is not sufficient within a week increase to 10 mg nightly.  We can go higher if needed and call if necessary. BC failure of alternatives, alprazolam 0.5 mg AM before work.   02/18/2021 appointment with the following noted: Anx down to 4-5/10.  Notices calmer with Xanax and throughout the day.Less anxiety and panic at work but getting better. Depression about the same 6/10.  Tries to distract herself from problems at home.. Sleep 6 hours now with olanzapine.   No SE Plan: increase olanzapine 15 mg HS. BC failure of alternatives, alprazolam 0.5 mg AM before work.   paroxetine to 40 mg at night.  04/28/2021 appointment with the following noted: Increase olanzapine 15 mg HS helped awhile with initial dizziness resolved.  Not drowsy. Still better than it was but holidays are hard.  Better than in a long time overall.  H saw a difference also.   Sleep 5-6 hours.  Never been a 7 hour sleeper.   Eating is normal now. Depression still there but better also 3/10.  Can enjoy some things. Better interest. Most stressful thing about work is the work load and talking with customers who  are difficult.  Had this job 13 years.  PDMP only shows Xanax.  She denies abusing substances  Past Psychiatric Medication Trials: Hydroxyzine, mirtazapine poor response for sleep,  Depakote,  Xanax, buspirone,  duloxetine, citalopram, Wellbutrin, fluoxetine, paroxetine 40 quetiapine low-dose for sleep Olanzapine 15  Notes and chart were reviewed with the patient regarding prior history and symptoms.  Review of Systems:  Review of Systems  Cardiovascular:  Negative for chest pain and palpitations.  Gastrointestinal:  Negative for nausea.  Endocrine: Negative for polyuria.  Neurological:  Negative for dizziness, tremors and weakness.  Psychiatric/Behavioral:  Positive for dysphoric mood. Negative for decreased  concentration and sleep disturbance. The patient is nervous/anxious.    Medications: I have reviewed the patient's current medications.  Current Outpatient Medications  Medication Sig Dispense Refill   Accu-Chek Softclix Lancets lancets USE UP TO FOUR TIMES DAILY AS DIRECTED 200 each 12   ALPRAZolam (XANAX) 0.5 MG tablet TAKE 2 TABLETS BY MOUTH TWICE A DAY AS NEEDED FOR ANXIETY 60 tablet 1   aspirin 81 MG EC tablet Take 1 tablet (81 mg total) by mouth daily. Swallow whole. 30 tablet 12   BD PEN NEEDLE NANO 2ND GEN 32G X 4 MM MISC USE AS DIRECTED 100 each 1   blood glucose meter kit and supplies KIT Dispense based on patient and insurance preference. Use up to four times daily as directed. (FOR ICD-9 250.00, 250.01). 1 each 0   busPIRone (BUSPAR) 30 MG tablet TAKE 1 TABLET BY MOUTH TWICE A DAY 60 tablet 0   divalproex (DEPAKOTE ER) 500 MG 24 hr tablet Take  3 tablets (1,500 mg total) by mouth at bedtime. 270 tablet 0   Insulin Glargine (BASAGLAR KWIKPEN) 100 UNIT/ML Inject 12 Units into the skin daily. 9 mL 3   metoprolol succinate (TOPROL-XL) 25 MG 24 hr tablet TAKE 1 TABLET BY MOUTH EVERY DAY 90 tablet 3   PARoxetine (PAXIL) 20 MG tablet TAKE 2 TABLETS BY MOUTH EVERY DAY 60 tablet 0   simvastatin (ZOCOR) 80 MG tablet TAKE 1 TABLET BY MOUTH EVERYDAY AT BEDTIME 90 tablet 3   XIIDRA 5 % SOLN Place 1 drop into both eyes in the morning and at bedtime.     OLANZapine (ZYPREXA) 20 MG tablet Take 1 tablet (20 mg total) by mouth at bedtime. 30 tablet 0   oxyCODONE (OXY IR/ROXICODONE) 5 MG immediate release tablet Take 1 tablet (5 mg total) by mouth every 6 (six) hours as needed for severe pain. (Patient not taking: Reported on 02/18/2021) 15 tablet 0   No current facility-administered medications for this visit.    Medication Side Effects: ? 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   Qualifier: Diagnosis of  By: Garen Grams     Diabetes mellitus type 2 in nonobese ([place]) 07/2016   Dyslipidemia    Headache(784.0)    History of pulmonary embolism 07/08/2020   HTN (hypertension)    Migraine    Pulmonary embolus (H[facility])    unprovoked    Family History  Problem Relation Age of Onset   Lupus Mother    Heart attack Father    Diabetes Father    Hypertension Father    Diabetes Paternal Grandmother    Hypertension Paternal Grandmother    Stroke Neg Hx    Kidney disease Neg Hx    Hyperlipidemia Neg Hx    Sudden death Neg Hx     Social History   Socioeconomic History   Marital status: Legally Separated    Spouse name: Not on file   Number of children: Not on file   Years of education: Not on file   Highest education level: Not on file  Occupational History   Occupation: mortgage loan specialist  Tobacco Use   Smoking status: Some Days    Packs/day: 0.50    Years: 25.00    Pack years: 12.50    Types: Cigarettes   Smokeless tobacco: Never  Vaping Use   Vaping Use: Never used  Substance and Sexual Activity   Alcohol use: No    Alcohol/week: 0.0 standard drinks   Drug use: Never   Sexual activity: Yes    Partners: Male    Comment: married  Other Topics Concern   Not on file  Social History Narrative   Regular exercise:  3 days weekly   Caffeine Use:  1 soda daily   One child biological daughter born in 68 and an adopted niece.   [place] specialist   Married- may be divorcing.          Social Determinants of Health   Financial Resource Strain: Not on file  Food Insecurity: Not on file  Transportation Needs: Not on file  Physical Activity: Not on file  Stress: Not on file  Social Connections: Not on file  Intimate Partner Violence: Not on file  Past Medical History, Surgical history, Social history,  and Family history were reviewed and updated as appropriate.   Please see review of systems for further details on the patient's review from today.   Objective:   Physical Exam:  LMP 11/28/2009   Physical Exam Constitutional:      General: She is not in acute distress.    Appearance: She is well-developed.  Musculoskeletal:        General: No deformity.  Neurological:     Mental Status: She is alert and oriented to person, place, and time.     Coordination: Coordination normal.  Psychiatric:        Attention and Perception: She is attentive. She does not perceive auditory hallucinations.        Mood and Affect: Mood is anxious and depressed. Affect is not labile, blunt, angry or tearful.        Speech: Speech normal. Speech is not rapid and pressured.        Behavior: Behavior normal. Behavior is not agitated.        Thought Content: Thought content normal. Thought content is not paranoid. Thought content does not include homicidal or suicidal ideation.        Cognition and Memory: Cognition normal.        Judgment: Judgment normal.     Comments: Insight intact. No auditory or visual hallucinations. No delusions.  Improved not resolved    Lab Review:     Component Value Date/Time   NA 138 01/07/2021 1620   NA 141 07/23/2020 1300   K 4.0 01/07/2021 1620   CL 103 01/07/2021 1620   CO2 29 01/07/2021 1620   GLUCOSE 79 01/07/2021 1620   BUN 11 01/07/2021 1620   BUN 7 07/23/2020 1300   CREATININE 0.96 01/07/2021 1620   CREATININE 0.72 06/22/2012 0840   CALCIUM 9.8 01/07/2021 1620   PROT 6.6 10/29/2020 1017   PROT 5.8 (L) 07/17/2020 1009   ALBUMIN 4.5 10/29/2020 1017   ALBUMIN 3.6 (L) 07/17/2020 1009   AST 12 10/29/2020 1017   ALT 9 10/29/2020 1017   ALKPHOS 95 10/29/2020 1017   BILITOT 0.5 10/29/2020 1017   BILITOT 0.3 07/17/2020 1009   GFRNONAA >60 01/07/2021 1620   GFRNONAA >89 06/22/2012 0840   GFRAA 102 07/23/2020 1300   GFRAA >89 06/22/2012 0840        Component Value Date/Time   WBC 9.0 01/07/2021 1620   RBC 4.46 01/07/2021 1620   HGB 13.2 01/07/2021 1620   HCT 39.7 01/07/2021 1620   PLT 194 01/07/2021 1620   MCV 89.0 01/07/2021 1620   MCH 29.6 01/07/2021 1620   MCHC 33.2 01/07/2021 1620   RDW 13.2 01/07/2021 1620   LYMPHSABS 2.7 06/22/2012 0840   MONOABS 0.5 06/22/2012 0840   EOSABS 0.1 06/22/2012 0840   BASOSABS 0.0 06/22/2012 0840    No results found for: POCLITH, LITHIUM   No results found for: PHENYTOIN, PHENOBARB, VALPROATE, CBMZ   .res Assessment: Plan:    Safiyyah was seen today for follow-up, anxiety and panic disorder with agoraphobia.  Diagnoses and all orders for this visit:  Moderate mixed bipolar I disorder (HCC) -     OLANZapine (ZYPREXA) 20 MG tablet; Take 1 tablet (20 mg total) by mouth at bedtime.  Panic disorder with agoraphobia -     OLANZapine (ZYPREXA) 20 MG tablet; Take 1 tablet (20 mg total) by mouth at bedtime.  Generalized anxiety disorder -     OLANZapine (ZYPREXA)  20 MG tablet; Take 1 tablet (20 mg total) by mouth at bedtime.  Insomnia due to mental condition  Greater than 50% of face to face time with patient was spent on counseling and coordination of care. We discussed the following: she and her husband have both noted significant improvement and her level of anxiety and she is less depressed with the addition of olanzapine and the increased to 15 mg at night.  She initially had some dizziness but that resolved and has no side effects now.  She would like further improvement if possible.   It is also a mood stabilizer.  It also can help with depression when added to an SSRI.  We discussed side effects in detail. Increase olanzapine 20 mg 3 hours before HS.  BC failure of alternatives, alprazolam 0.5 mg AM before work.  Alternatives clonazepam, Ativan  Because of the severity of the panic continue paroxetine to 40 mg at night.  It was explained to her this could worsen her mood cycling  and to let us know if that occurs.  Fortunately because no mood swings and her panic and anxiety symptoms are improved but not controlled.    Discussed potential metabolic side effects associated with atypical antipsychotics, as well as potential risk for movement side effects. Advised pt to contact office if movement side effects occur.  Metabolic side effects are unlikely at this low dose of quetiapine and almost no risk of EPS at this low-dose.   We discussed the short-term risks associated with benzodiazepines including sedation and increased fall risk among others.  Discussed long-term side effect risk including dependence, potential withdrawal symptoms, and the potential eventual dose-related risk of dementia.   FMLA same as last year.  This appt was 30 mins.   Follow-up 2 mos  Lynder Parents MD, DFAPA  Please see After Visit Summary for patient specific instructions.  As soon as convenient, get the blood test in the morning.  No fasting is required.  Future Appointments  Date Time Provider [place]  05/12/2021  4:00 PM [name], NP [facility]    No orders of the defined types were placed in this encounter.     -------------------------------

## 2021-05-02 ENCOUNTER — Other Ambulatory Visit: Payer: Self-pay | Admitting: Psychiatry

## 2021-05-02 ENCOUNTER — Other Ambulatory Visit: Payer: Self-pay | Admitting: Family

## 2021-05-02 DIAGNOSIS — F3162 Bipolar disorder, current episode mixed, moderate: Secondary | ICD-10-CM

## 2021-05-02 DIAGNOSIS — F411 Generalized anxiety disorder: Secondary | ICD-10-CM

## 2021-05-02 DIAGNOSIS — F4001 Agoraphobia with panic disorder: Secondary | ICD-10-CM

## 2021-05-03 ENCOUNTER — Other Ambulatory Visit: Payer: Self-pay | Admitting: Psychiatry

## 2021-05-03 DIAGNOSIS — F411 Generalized anxiety disorder: Secondary | ICD-10-CM

## 2021-05-03 DIAGNOSIS — F4001 Agoraphobia with panic disorder: Secondary | ICD-10-CM

## 2021-05-05 ENCOUNTER — Encounter (HOSPITAL_COMMUNITY)
Admission: EM | Disposition: A | Discharge status: Home / Self Care | Payer: Self-pay | Source: Home / Self Care | Attending: Cardiovascular Disease

## 2021-05-05 ENCOUNTER — Inpatient Hospital Stay (HOSPITAL_COMMUNITY)
Admission: EM | Admit: 2021-05-05 | Discharge: 2021-05-07 | DRG: 281 | Disposition: A | Discharge status: Home / Self Care | Payer: 59 | Attending: Cardiovascular Disease | Admitting: Cardiovascular Disease

## 2021-05-05 ENCOUNTER — Emergency Department (HOSPITAL_COMMUNITY): Payer: 59

## 2021-05-05 ENCOUNTER — Other Ambulatory Visit: Payer: Self-pay

## 2021-05-05 ENCOUNTER — Encounter (HOSPITAL_COMMUNITY): Payer: Self-pay | Admitting: Emergency Medicine

## 2021-05-05 DIAGNOSIS — Z79899 Other long term (current) drug therapy: Secondary | ICD-10-CM | POA: Diagnosis not present

## 2021-05-05 DIAGNOSIS — Z833 Family history of diabetes mellitus: Secondary | ICD-10-CM

## 2021-05-05 DIAGNOSIS — I2510 Atherosclerotic heart disease of native coronary artery without angina pectoris: Secondary | ICD-10-CM | POA: Diagnosis present

## 2021-05-05 DIAGNOSIS — Z7982 Long term (current) use of aspirin: Secondary | ICD-10-CM

## 2021-05-05 DIAGNOSIS — Z794 Long term (current) use of insulin: Secondary | ICD-10-CM | POA: Diagnosis not present

## 2021-05-05 DIAGNOSIS — F319 Bipolar disorder, unspecified: Secondary | ICD-10-CM | POA: Diagnosis present

## 2021-05-05 DIAGNOSIS — Z720 Tobacco use: Secondary | ICD-10-CM | POA: Diagnosis not present

## 2021-05-05 DIAGNOSIS — I472 Ventricular tachycardia, unspecified: Secondary | ICD-10-CM | POA: Diagnosis present

## 2021-05-05 DIAGNOSIS — I25710 Atherosclerosis of autologous vein coronary artery bypass graft(s) with unstable angina pectoris: Secondary | ICD-10-CM

## 2021-05-05 DIAGNOSIS — E785 Hyperlipidemia, unspecified: Secondary | ICD-10-CM | POA: Diagnosis present

## 2021-05-05 DIAGNOSIS — I10 Essential (primary) hypertension: Secondary | ICD-10-CM | POA: Diagnosis present

## 2021-05-05 DIAGNOSIS — I25110 Atherosclerotic heart disease of native coronary artery with unstable angina pectoris: Secondary | ICD-10-CM | POA: Diagnosis not present

## 2021-05-05 DIAGNOSIS — Z86711 Personal history of pulmonary embolism: Secondary | ICD-10-CM | POA: Diagnosis not present

## 2021-05-05 DIAGNOSIS — I214 Non-ST elevation (NSTEMI) myocardial infarction: Principal | ICD-10-CM | POA: Diagnosis present

## 2021-05-05 DIAGNOSIS — E118 Type 2 diabetes mellitus with unspecified complications: Secondary | ICD-10-CM

## 2021-05-05 DIAGNOSIS — Z8249 Family history of ischemic heart disease and other diseases of the circulatory system: Secondary | ICD-10-CM

## 2021-05-05 DIAGNOSIS — E119 Type 2 diabetes mellitus without complications: Secondary | ICD-10-CM | POA: Diagnosis present

## 2021-05-05 DIAGNOSIS — Z951 Presence of aortocoronary bypass graft: Secondary | ICD-10-CM | POA: Diagnosis not present

## 2021-05-05 DIAGNOSIS — Z20822 Contact with and (suspected) exposure to covid-19: Secondary | ICD-10-CM | POA: Diagnosis present

## 2021-05-05 DIAGNOSIS — Z87891 Personal history of nicotine dependence: Secondary | ICD-10-CM | POA: Diagnosis present

## 2021-05-05 DIAGNOSIS — F17210 Nicotine dependence, cigarettes, uncomplicated: Secondary | ICD-10-CM | POA: Diagnosis present

## 2021-05-05 HISTORY — PX: LEFT HEART CATH AND CORS/GRAFTS ANGIOGRAPHY: CATH118250

## 2021-05-05 LAB — POCT ACTIVATED CLOTTING TIME: Activated Clotting Time: 143 seconds

## 2021-05-05 LAB — TROPONIN I (HIGH SENSITIVITY)
Troponin I (High Sensitivity): 757 ng/L (ref <18)
Troponin I (High Sensitivity): 1840 ng/L (ref <18)

## 2021-05-05 LAB — CBG MONITORING, ED: Glucose-Capillary: 132 mg/dL — ABNORMAL HIGH (ref 70–99)

## 2021-05-05 LAB — BASIC METABOLIC PANEL
Anion gap: 9 (ref 5–15)
BUN: 14 mg/dL (ref 6–20)
CO2: 23 mmol/L (ref 22–32)
Calcium: 9.3 mg/dL (ref 8.9–10.3)
Chloride: 100 mmol/L (ref 98–111)
Creatinine, Ser: 0.92 mg/dL (ref 0.44–1.00)
GFR, Estimated: >60 mL/min (ref >60)
Glucose, Bld: 179 mg/dL — ABNORMAL HIGH (ref 70–99)
Potassium: 3.7 mmol/L (ref 3.5–5.1)
Sodium: 132 mmol/L — ABNORMAL LOW (ref 135–145)

## 2021-05-05 LAB — GLUCOSE, CAPILLARY
Glucose-Capillary: 121 mg/dL — ABNORMAL HIGH (ref 70–99)
Glucose-Capillary: 166 mg/dL — ABNORMAL HIGH (ref 70–99)

## 2021-05-05 LAB — CBC
HCT: 38.6 % (ref 36.0–46.0)
Hemoglobin: 13 g/dL (ref 12.0–15.0)
MCH: 30 pg (ref 26.0–34.0)
MCHC: 33.7 g/dL (ref 30.0–36.0)
MCV: 88.9 fL (ref 80.0–100.0)
Platelets: 207 10*3/uL (ref 150–400)
RBC: 4.34 MIL/uL (ref 3.87–5.11)
RDW: 13.9 % (ref 11.5–15.5)
WBC: 8.8 10*3/uL (ref 4.0–10.5)
nRBC: 0 % (ref 0.0–0.2)

## 2021-05-05 LAB — HEMOGLOBIN A1C
Hgb A1c MFr Bld: 6.7 % — ABNORMAL HIGH (ref 4.8–5.6)
Mean Plasma Glucose: 145.59 mg/dL

## 2021-05-05 LAB — RESP PANEL BY RT-PCR (FLU A&B, COVID) ARPGX2
Influenza A by PCR: NEGATIVE
Influenza B by PCR: NEGATIVE
SARS Coronavirus 2 by RT PCR: NEGATIVE

## 2021-05-05 LAB — HEPARIN LEVEL (UNFRACTIONATED): Heparin Unfractionated: 0.32 IU/mL (ref 0.30–0.70)

## 2021-05-05 SURGERY — LEFT HEART CATH AND CORS/GRAFTS ANGIOGRAPHY
Anesthesia: LOCAL

## 2021-05-05 MED ORDER — HEPARIN BOLUS VIA INFUSION
4000.0000 [IU] | Freq: Once | INTRAVENOUS | Status: AC
  Administered 2021-05-05: 4000 [IU] via INTRAVENOUS
  Filled 2021-05-05: qty 4000

## 2021-05-05 MED ORDER — INSULIN ASPART 100 UNIT/ML IJ SOLN
0.0000 [IU] | Freq: Three times a day (TID) | INTRAMUSCULAR | Status: DC
Start: 2021-05-06 — End: 2021-05-07
  Administered 2021-05-06: 2 [IU] via SUBCUTANEOUS
  Administered 2021-05-06 – 2021-05-07 (×2): 3 [IU] via SUBCUTANEOUS

## 2021-05-05 MED ORDER — SODIUM CHLORIDE 0.9% FLUSH
3.0000 mL | Freq: Two times a day (BID) | INTRAVENOUS | Status: DC
  Administered 2021-05-05 – 2021-05-07 (×4): 3 mL via INTRAVENOUS

## 2021-05-05 MED ORDER — FENTANYL CITRATE (PF) 100 MCG/2ML IJ SOLN
INTRAMUSCULAR | Status: AC
  Filled 2021-05-05: qty 2

## 2021-05-05 MED ORDER — HYDRALAZINE HCL 20 MG/ML IJ SOLN: 10.0000 mg | INTRAMUSCULAR | Status: AC | PRN

## 2021-05-05 MED ORDER — CLOPIDOGREL BISULFATE 75 MG PO TABS
75.0000 mg | ORAL_TABLET | Freq: Every day | ORAL | Status: DC
  Administered 2021-05-06 – 2021-05-07 (×2): 75 mg via ORAL
  Filled 2021-05-05 (×2): qty 1

## 2021-05-05 MED ORDER — HEPARIN (PORCINE) IN NACL 2000-0.9 UNIT/L-% IV SOLN
INTRAVENOUS | Status: DC | PRN
  Administered 2021-05-05: 1000 mL

## 2021-05-05 MED ORDER — ATORVASTATIN CALCIUM 40 MG PO TABS
40.0000 mg | ORAL_TABLET | Freq: Every day | ORAL | Status: DC
  Administered 2021-05-05 – 2021-05-06 (×2): 40 mg via ORAL
  Filled 2021-05-05 (×2): qty 1

## 2021-05-05 MED ORDER — SODIUM CHLORIDE 0.9 % IV SOLN
INTRAVENOUS | Status: AC | PRN
  Administered 2021-05-05: 10 mL/h via INTRAVENOUS

## 2021-05-05 MED ORDER — ACETAMINOPHEN 325 MG PO TABS
650.0000 mg | ORAL_TABLET | ORAL | Status: DC | PRN
  Administered 2021-05-06: 650 mg via ORAL
  Filled 2021-05-05: qty 2

## 2021-05-05 MED ORDER — OLANZAPINE 10 MG PO TABS
20.0000 mg | ORAL_TABLET | Freq: Every day | ORAL | Status: DC
  Filled 2021-05-05 (×2): qty 2

## 2021-05-05 MED ORDER — ASPIRIN EC 81 MG PO TBEC: 81.0000 mg | DELAYED_RELEASE_TABLET | Freq: Every day | ORAL | Status: DC

## 2021-05-05 MED ORDER — IOHEXOL 350 MG/ML SOLN
INTRAVENOUS | Status: DC | PRN
  Administered 2021-05-05: 95 mL

## 2021-05-05 MED ORDER — SODIUM CHLORIDE 0.9 % IV SOLN: INTRAVENOUS | Status: AC

## 2021-05-05 MED ORDER — MIDAZOLAM HCL 2 MG/2ML IJ SOLN
INTRAMUSCULAR | Status: AC
  Filled 2021-05-05: qty 2

## 2021-05-05 MED ORDER — DIVALPROEX SODIUM ER 500 MG PO TB24
1500.0000 mg | ORAL_TABLET | Freq: Every day | ORAL | Status: DC
  Filled 2021-05-05 (×2): qty 3

## 2021-05-05 MED ORDER — NITROGLYCERIN 0.4 MG SL SUBL
0.4000 mg | SUBLINGUAL_TABLET | Freq: Once | SUBLINGUAL | Status: AC
  Administered 2021-05-05: 0.4 mg via SUBLINGUAL

## 2021-05-05 MED ORDER — LIDOCAINE HCL (PF) 1 % IJ SOLN
INTRAMUSCULAR | Status: DC | PRN
  Administered 2021-05-05: 15 mL

## 2021-05-05 MED ORDER — PAROXETINE HCL 20 MG PO TABS
40.0000 mg | ORAL_TABLET | Freq: Every day | ORAL | Status: DC
  Administered 2021-05-06: 40 mg via ORAL
  Filled 2021-05-05 (×3): qty 2

## 2021-05-05 MED ORDER — LABETALOL HCL 5 MG/ML IV SOLN: 10.0000 mg | INTRAVENOUS | Status: AC | PRN

## 2021-05-05 MED ORDER — LIDOCAINE HCL (PF) 1 % IJ SOLN
INTRAMUSCULAR | Status: AC
  Filled 2021-05-05: qty 30

## 2021-05-05 MED ORDER — HEPARIN (PORCINE) IN NACL 1000-0.9 UT/500ML-% IV SOLN
INTRAVENOUS | Status: AC
  Filled 2021-05-05: qty 1000

## 2021-05-05 MED ORDER — SODIUM CHLORIDE 0.9 % IV SOLN: 250.0000 mL | INTRAVENOUS | Status: DC | PRN

## 2021-05-05 MED ORDER — ASPIRIN EC 81 MG PO TBEC
81.0000 mg | DELAYED_RELEASE_TABLET | Freq: Every day | ORAL | Status: DC
  Administered 2021-05-06 – 2021-05-07 (×2): 81 mg via ORAL
  Filled 2021-05-05 (×2): qty 1

## 2021-05-05 MED ORDER — BUSPIRONE HCL 5 MG PO TABS
30.0000 mg | ORAL_TABLET | Freq: Two times a day (BID) | ORAL | Status: DC
  Filled 2021-05-05 (×3): qty 6

## 2021-05-05 MED ORDER — CLOPIDOGREL BISULFATE 75 MG PO TABS
300.0000 mg | ORAL_TABLET | Freq: Once | ORAL | Status: AC
  Administered 2021-05-05: 300 mg via ORAL
  Filled 2021-05-05: qty 4

## 2021-05-05 MED ORDER — NITROGLYCERIN 0.4 MG SL SUBL: 0.4000 mg | SUBLINGUAL_TABLET | SUBLINGUAL | Status: DC | PRN

## 2021-05-05 MED ORDER — MIDAZOLAM HCL 2 MG/2ML IJ SOLN
INTRAMUSCULAR | Status: DC | PRN
  Administered 2021-05-05: 1 mg via INTRAVENOUS

## 2021-05-05 MED ORDER — SODIUM CHLORIDE 0.9% FLUSH: 3.0000 mL | INTRAVENOUS | Status: DC | PRN

## 2021-05-05 MED ORDER — HEPARIN (PORCINE) 25000 UT/250ML-% IV SOLN
850.0000 [IU]/h | INTRAVENOUS | Status: DC
  Administered 2021-05-05: 850 [IU]/h via INTRAVENOUS

## 2021-05-05 MED ORDER — FENTANYL CITRATE (PF) 100 MCG/2ML IJ SOLN
INTRAMUSCULAR | Status: DC | PRN
  Administered 2021-05-05: 25 ug via INTRAVENOUS

## 2021-05-05 MED ORDER — ASPIRIN 81 MG PO CHEW: 81.0000 mg | CHEWABLE_TABLET | ORAL | Status: AC

## 2021-05-05 MED ORDER — METOPROLOL SUCCINATE ER 25 MG PO TB24
25.0000 mg | ORAL_TABLET | Freq: Every day | ORAL | Status: DC
  Administered 2021-05-05 – 2021-05-07 (×3): 25 mg via ORAL
  Filled 2021-05-05 (×3): qty 1

## 2021-05-05 MED ORDER — NITROGLYCERIN IN D5W 200-5 MCG/ML-% IV SOLN
0.0000 ug/min | INTRAVENOUS | Status: DC
  Administered 2021-05-05: 5 ug/min via INTRAVENOUS

## 2021-05-05 MED ORDER — SODIUM CHLORIDE 0.9 % WEIGHT BASED INFUSION
1.0000 mL/kg/h | INTRAVENOUS | Status: DC
  Administered 2021-05-05: 1 mL/kg/h via INTRAVENOUS

## 2021-05-05 MED ORDER — ONDANSETRON HCL 4 MG/2ML IJ SOLN: 4.0000 mg | Freq: Four times a day (QID) | INTRAMUSCULAR | Status: DC | PRN

## 2021-05-05 MED ORDER — SODIUM CHLORIDE 0.9 % WEIGHT BASED INFUSION
3.0000 mL/kg/h | INTRAVENOUS | Status: AC
  Administered 2021-05-05: 3 mL/kg/h via INTRAVENOUS

## 2021-05-05 MED ORDER — IOHEXOL 350 MG/ML SOLN
75.0000 mL | Freq: Once | INTRAVENOUS | Status: AC | PRN
  Administered 2021-05-05: 75 mL via INTRAVENOUS

## 2021-05-05 SURGICAL SUPPLY — 7 items
CATH INFINITI 5FR MULTPACK ANG (CATHETERS) ×1 IMPLANT
KIT HEART LEFT (KITS) ×2 IMPLANT
PACK CARDIAC CATHETERIZATION (CUSTOM PROCEDURE TRAY) ×2 IMPLANT
SHEATH PINNACLE 5F 10CM (SHEATH) ×1 IMPLANT
TRANSDUCER W/STOPCOCK (MISCELLANEOUS) ×2 IMPLANT
TUBING CIL FLEX 10 FLL-RA (TUBING) ×2 IMPLANT
WIRE EMERALD 3MM-J .035X150CM (WIRE) ×1 IMPLANT

## 2021-05-05 NOTE — Progress Notes (Signed)
Patient ambulated to the [facility] w/ RN. No s/sx of bleeding from right femoral site. Site soft upon palpation. Level 0.

## 2021-05-05 NOTE — Progress Notes (Signed)
ANTICOAGULATION CONSULT NOTE - Follow Up Consult  Pharmacy Consult for Heparin  Indication: chest pain/ACS  No Known Allergies  Patient Measurements: Height: 5\' 5"  (165.1 cm) Weight: 65.3 kg (144 lb) IBW/kg (Calculated) : 57  Vital Signs: BP: 111/80 (12/06 1500) Pulse Rate: 92 (12/06 1500)  Labs: Recent Labs    05/05/21 0241 05/05/21 0433 05/05/21 1400  HGB 13.0  --   --   HCT 38.6  --   --   PLT 207  --   --   HEPARINUNFRC  --   --  0.32  CREATININE 0.92  --   --   TROPONINIHS 757* 1,840*  --      Estimated Creatinine Clearance: 60 mL/min (by C-G formula based on SCr of 0.92 mg/dL).   Medical History: 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   Qualifier: Diagnosis of  By: 04/02/2007     Diabetes mellitus type 2 in nonobese (HCC) 07/2016   Dyslipidemia    Headache(784.0)    History of pulmonary embolism 07/08/2020   HTN (hypertension)    Migraine    Pulmonary embolus ([facility])    unprovoked     Assessment: 60 y/o F with chest pain and elevated troponin. Starting heparin per pharmacy. Was previously on Xarelto for PE, but currently NOT on anti-coagulation. CT angio today is negative. CBC/renal function good.   Initial heparin level is therapeutic on 850 units/hr. H/H and Plt wnl.   Goal of Therapy:  Heparin level 0.3-0.7 units/ml Monitor platelets by anticoagulation protocol: Yes   Plan:  Continue heparin drip at 850 units/hr F/u 6 hr confirmatory HL Daily CBC/Heparin level Monitor for bleeding   43, PharmD., BCPS, BCCCP Clinical Pharmacist Please refer to [facility] for unit-specific pharmacist

## 2021-05-05 NOTE — ED Notes (Signed)
Patient reports improvement of pain to a 2/10.  Nitro drip increased per order.  Will continue to monitor

## 2021-05-05 NOTE — ED Provider Notes (Signed)
[facility] EMERGENCY DEPARTMENT Provider Note   CSN: [id number] Arrival date & time: 05/05/21  9169     History Chief Complaint  Patient presents with   Chest Pain    Bailey Greene is a 60 y.o. female.  HPI      This is a 60 year old female with a history of coronary artery disease status post CABG, diabetes, hypertension, hyperlipidemia, PE who presents with chest discomfort.  Patient reports onset of chest pain on Saturday.  She states that initially her pain started when she was walking into [employer].  She had to turn around and get rest.  She states initially it was short-lived and infrequent; however, it has become more constant and is now while she is at rest.  It is pressure-like and radiates into her left arm.  She states that her left arm feels very sore.  It is similar to when she needed her CABG.  Currently she is having 2 out of 10 pain.  She did have some improvement with nitroglycerin.  Reports history of small PE in the past.  She is no longer on anticoagulation.  Denies shortness of breath or fevers  Patient's primary cardiologist is Dr. Percival Spanish.  Chart reviewed.  She did have a CT scan in February which showed a very small pulmonary embolus.  She also had a stress test at that time which was low risk for acute ischemia. 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   Carpal tunnel syndrome 05/03/2013   Loss of weight 08/23/2012   General medical examination 07/06/2011   Goiter 09/23/2010   Anxiety state 01/31/2007   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   Essential hypertension 01/31/2007   Coronary artery disease involving native coronary artery of native heart with angina pectoris ([place]) 01/31/2007   CAD (coronary artery disease) 2006    Past Surgical History:  Procedure Laterality Date   BUNIONECTOMY  08/2011   right foot   CARDIAC CATHETERIZATION  2007   severe native 3 v dz, all grafts patent (LIMA-LAD, RIMA-Diag, SVG-OM1-OM2, R radial-PDA)   CESAREAN SECTION  1984   CORONARY ARTERY BYPASS GRAFT  2006    Coronary artery bypass grafting x5 with a left  internal  mammary to the left anterior descending coronary artery.  Free right  internal mammary to the diagonal coronary artery, sequential reverse  saphenous vein graft to the first and second obtuse marginal, right  artery bypass to the posterior descending coronary artery with endo-vein harvesting.   UMBILICAL HERNIA REPAIR N/A 01/16/2021   Procedure:  UMBILICAL  HERNIA REPAIR;  Surgeon: Dwan Bolt, MD;  Location: [facility];  Service: General;  Laterality: N/A;     OB History   No obstetric history on file.     Family History  Problem Relation Age of Onset   Lupus Mother    Heart attack Father    Diabetes Father    Hypertension Father    Diabetes Paternal Grandmother    Hypertension Paternal Grandmother    Stroke Neg Hx    Kidney disease Neg Hx    Hyperlipidemia Neg Hx    Sudden death Neg Hx     Social History   Tobacco Use   Smoking status: Some Days    Packs/day: 0.50    Years: 25.00    Pack years: 12.50    Types: Cigarettes   Smokeless  tobacco: Never  Vaping Use   Vaping Use: Never used  Substance Use Topics   Alcohol use: No    Alcohol/week: 0.0 standard drinks   Drug use: Never    Home Medications Prior to Admission medications   Medication Sig Start Date End Date Taking? Authorizing Provider  Accu-Chek Softclix Lancets lancets USE UP TO FOUR TIMES DAILY AS DIRECTED 07/29/20   [name], NP  ALPRAZolam Duanne Moron) 0.5 MG tablet TAKE 2 TABLETS BY MOUTH TWICE A DAY AS NEEDED FOR ANXIETY 03/16/21   Cottle, Billey Co., MD  aspirin 81 MG EC tablet Take 1 tablet (81 mg total) by mouth daily. Swallow whole. 02/10/21   [name], NP  blood glucose meter kit and supplies KIT Dispense based on patient and insurance preference. Use up to four times daily as directed. (FOR ICD-9 250.00, 250.01). 07/09/20   Geradine Girt, DO  busPIRone (BUSPAR) 30 MG tablet TAKE 1 TABLET BY MOUTH TWICE A DAY 02/05/21   Cottle, Billey Co., MD  divalproex (DEPAKOTE ER) 500 MG 24 hr tablet Take 3 tablets (1,500 mg total) by mouth at bedtime. 03/05/20   Cottle, Billey Co., MD  Insulin Glargine [facility] KWIKPEN) 100 UNIT/ML Inject 12 Units into the skin daily. 10/13/20   [name], NP  Insulin Pen Needle (BD PEN NEEDLE NANO 2ND GEN) 32G X 4 MM MISC USE AS DIRECTED 05/04/21   [name], NP  metoprolol succinate (TOPROL-XL) 25 MG 24 hr tablet TAKE 1 TABLET BY MOUTH EVERY DAY 02/04/21   Hilty, Nadean Corwin, MD  OLANZapine (ZYPREXA) 20 MG tablet Take 1 tablet (20 mg total) by mouth at bedtime. 04/28/21   Cottle, Billey Co., MD  oxyCODONE (OXY IR/ROXICODONE) 5 MG immediate release tablet Take 1 tablet (5 mg total) by mouth every 6 (six) hours as needed for severe pain. Patient not taking: Reported on 02/18/2021 01/16/21   Dwan Bolt, MD  PARoxetine (PAXIL) 20 MG tablet TAKE 2 TABLETS BY MOUTH EVERY DAY 04/09/21   Cottle, Billey Co., MD  simvastatin (ZOCOR) 80 MG tablet TAKE 1 TABLET BY MOUTH EVERYDAY AT BEDTIME 04/20/21    Minus Breeding, MD  XIIDRA 5 % SOLN Place 1 drop into both eyes in the morning and at bedtime. 05/29/20   [provider]    Allergies    Patient has no known allergies.  Review of Systems   Review of Systems  Constitutional:  Negative for fever.  Respiratory:  Negative for cough, chest tightness and shortness of breath.   Cardiovascular:  Positive for chest pain. Negative for leg swelling.  Gastrointestinal:  Negative for abdominal pain,  nausea and vomiting.  Genitourinary:  Negative for dysuria.  All other systems reviewed and are negative.  Physical Exam Updated Vital Signs BP 125/80   Pulse 82   Temp 98.6 F (37 C) (Oral)   Resp 15   Ht 1.651 m ($Remove'5\' 5"'CmPzuon$ )   Wt 65.3 kg   LMP 11/28/2009   SpO2 100%   BMI 23.96 kg/m   Physical Exam Vitals and nursing note reviewed.  Constitutional:      Appearance: She is well-developed. She is not ill-appearing.  HENT:     Head: Normocephalic and atraumatic.  Eyes:     Pupils: Pupils are equal, round, and reactive to light.  Cardiovascular:     Rate and Rhythm: Normal rate and regular rhythm.     Heart sounds: Normal heart sounds.     Comments: Well-healed midline sternotomy scar Pulmonary:     Effort: Pulmonary effort is normal. No respiratory distress.     Breath sounds: No wheezing.  Abdominal:     General: Bowel sounds are normal.     Palpations: Abdomen is soft.  Musculoskeletal:     Cervical back: Neck supple.     Right lower leg: No tenderness. No edema.     Left lower leg: No tenderness. No edema.  Skin:    General: Skin is warm and dry.  Neurological:     Mental Status: She is alert and oriented to person, place, and time.  Psychiatric:        Mood and Affect: Mood normal.    ED Results / Procedures / Treatments   Labs (all labs ordered are listed, but only abnormal results are displayed) Labs Reviewed  BASIC METABOLIC PANEL - Abnormal; Notable for the following components:      Result Value   Sodium  132 (*)    Glucose, Bld 179 (*)    All other components within normal limits  TROPONIN I (HIGH SENSITIVITY) - Abnormal; Notable for the following components:   Troponin I (High Sensitivity) 757 (*)    All other components within normal limits  TROPONIN I (HIGH SENSITIVITY) - Abnormal; Notable for the following components:   Troponin I (High Sensitivity) 1,840 (*)    All other components within normal limits  RESP PANEL BY RT-PCR (FLU A&B, COVID) ARPGX2  CBC  I-STAT BETA HCG BLOOD, ED ([facility], [facility], [facility] ONLY)    EKG EKG Interpretation  Date/Time:  Tuesday May 05 2021 03:33:24 EST Ventricular Rate:  87 PR Interval:  142 QRS Duration: 84 QT Interval:  364 QTC Calculation: 438 R Axis:   25 Text Interpretation: Normal sinus rhythm Right atrial enlargement Septal infarct , age undetermined Abnormal ECG Confirmed by Thayer Jew [phone]) on 05/05/2021 3:50:17 AM  Radiology DG Chest 2 View  Result Date: 05/05/2021 CLINICAL DATA:  Chest pain radiating into the left arm. EXAM: CHEST - 2 VIEW COMPARISON:  07/07/2020 FINDINGS: Postsurgical changes are noted and stable. The lungs are well aerated bilaterally. No focal infiltrate or effusion is seen. No bony abnormality is noted. IMPRESSION: No acute abnormality seen. Electronically Signed   By: Inez Catalina M.D.   On: 05/05/2021 03:17    Procedures .Critical Care Performed by: Merryl Hacker, MD Authorized by: Merryl Hacker, MD   Critical care provider statement:    Critical care time (minutes):  60   Critical care was necessary to treat or prevent imminent or life-threatening deterioration of the following conditions:  Cardiac failure   Critical care was time  spent personally by me on the following activities:  Development of treatment plan with patient or surrogate, discussions with consultants, evaluation of patient's response to treatment, examination of patient, ordering and review of laboratory studies, ordering and review of  radiographic studies, ordering and performing treatments and interventions, pulse oximetry, re-evaluation of patient's condition and review of old charts   Medications Ordered in ED Medications  nitroGLYCERIN 50 mg in dextrose 5 % 250 mL (0.2 mg/mL) infusion (has no administration in time range)  heparin bolus via infusion 4,000 Units (has no administration in time range)  heparin ADULT infusion 100 units/mL (25000 units/224mL) (has no administration in time range)  nitroGLYCERIN (NITROSTAT) SL tablet 0.4 mg (0.4 mg Sublingual Given 05/05/21 0331)  iohexol (OMNIPAQUE) 350 MG/ML injection 75 mL (75 mLs Intravenous Contrast Given 05/05/21 0504)    ED Course  I have reviewed the triage vital signs and the nursing notes.  Pertinent labs & imaging results that were available during my care of the patient were reviewed by me and considered in my medical decision making (see chart for details).    MDM Rules/Calculators/A&P                           Patient presents with chest pain.  Increasing in duration and interval.  Now is nonexertional but initially was exertional.  She is overall nontoxic and vital signs are reassuring.  Significant history of coronary artery disease.  Most recent evaluation was in February of this year with a stress test that was reassuring.  She also had a PE although it at the time was very small.  She is no longer on anticoagulation.  Both of these are considerations.  I would favor ACS given the description of pain although will further evaluate for PE as well.  She is having 2 out of 10 pain.  She was started on IV nitroglycerin and IV heparin.  EKG shows no ST elevation and is largely unchanged from prior.  Chest x-ray is without pneumothorax or pneumonia.  CT obtained.  Read pending.  Independently reviewed by myself.  I did not see any large proximal PEs which would cause significant clinical change or strain.  We will consult with cardiology with my concern for NSTEMI.   Initial troponin 757 with repeat of 1840.  Final Clinical Impression(s) / ED Diagnoses Final diagnoses:  NSTEMI (non-ST elevated myocardial infarction) [facility])    Rx / [place] Orders ED Discharge Orders     None        Shahab Polhamus, Barbette Hair, MD 05/05/21 [phone]

## 2021-05-05 NOTE — Progress Notes (Signed)
Site area: Right groin a 5 french arterial sheath was removed  Site Prior to Removal:  Level 0  Pressure Applied For 20 MINUTES    Bedrest Beginning at 1845p X 4 hours  Manual:   No.  Patient Status During Pull:  stable  Post Pull Groin Site:  Level 0  Post Pull Instructions Given:  Yes.    Post Pull Pulses Present:  Yes.    Dressing Applied:  Yes.    Comments:

## 2021-05-05 NOTE — ED Notes (Signed)
Patient taken to CT at this time.

## 2021-05-05 NOTE — H&P (Addendum)
Cardiology Admission History and Physical:   Patient ID: Jeneal Vogl MRN: [id number]; DOB: 12/13/1960   Admission date: 05/05/2021  PCP:  [name], NP   [facility] Providers Cardiologist:  Minus Breeding, MD  Cardiology APP:  Warren Lacy, PA-C  {  Chief Complaint:  Chest pain   Patient Profile:   Naimah Yingst is a 60 y.o. female with history of CAD s/p CABG, hypertension, hyperlipidemia, diabetes mellitus, bipolar disorder, pulmonary embolism (completed 6 months of anticoagulation) and anxiety who is being seen 05/05/2021 for the evaluation of chest pain and found to have elevated troponin consistent with non-STEMI.  Hx of CAD s/p CABG x5 (LIMA-LAD, RIMA-Diag, sequential SVG-OM1-OM2, right radial-PDA) in 2006 with cardiac cath in 2008 showing patent grafts. Patient has a long-standing history of sharp chest pain in the setting of emotional stress. Most recent ischemic evaluation was a Myoview in 07/2017 which was low risk with no evidence of ischemia.   Diagnosed February 2022 for acute PE.  Treated with Eliquis for anticoagulation.  Last echocardiogram August 14, 2020 showed LV function of 60 to 65%, no regional wall motion abnormality.  Monitor 07/2020 showed normal sinus rhythm.  1 run of nonsustained VT for 11 beats.    History of Present Illness:   Ms. Vuncannon presented for evaluation of chest pain by EMS.  Patient reported she had intermittent substernal chest pressure which was initially exertional and now occurring at rest since Saturday.  Lately she is more fatigued and tired with exertional activity.  Yesterday evening she had sudden onset, worse episode, left-sided chest pressure radiating to her left arm.  Associated with shortness of breath and diaphoresis.  Took 1 sublingual nitroglycerin.  This helped to improve her pain but did not resolve.  EMS was called and brought to ER for further evaluation.  She was given aspirin 324 milligrams and an  additional sublingual nitroglycerin.  Troponin elevated 757>>1840.  On IV heparin and nitroglycerin drip.  Currently chest pain-free.  CT angio of chest negative for pulmonary embolism.  She denies dizziness, palpitation, orthopnea, PND, syncope, lower extremity edema or melena.  Compliant with her medications.  Past Medical History:  Diagnosis Date   Anxiety    Bipolar affective disorder ([place])    CAD (coronary artery disease) 2006   CABG w/ LIMA-LAD, RIMA-Diag, SVG-OM1-OM2, R radial-PDA   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   Qualifier: Diagnosis of  By: Garen Grams     Diabetes mellitus type 2 in nonobese ([place]) 07/2016   Dyslipidemia    Headache(784.0)    History of pulmonary embolism 07/08/2020   HTN (hypertension)    Migraine    Pulmonary embolus ([place])    unprovoked    Past Surgical History:  Procedure Laterality Date   BUNIONECTOMY  08/2011   right foot   CARDIAC CATHETERIZATION  2007   severe native 3 v dz, all grafts patent (LIMA-LAD, RIMA-Diag, SVG-OM1-OM2, R radial-PDA)   [place]   CORONARY ARTERY BYPASS GRAFT  2006    Coronary artery bypass grafting x5 with a left  internal  mammary to the left anterior descending coronary artery.  Free right  internal mammary to the diagonal coronary artery, sequential reverse  saphenous vein graft to the first and second obtuse marginal, right  artery bypass to the posterior descending coronary artery with endo-vein harvesting.   UMBILICAL HERNIA REPAIR N/A 01/16/2021   Procedure: UMBILICAL  HERNIA REPAIR;  Surgeon: Dwan Bolt, MD;  Location: [place];  Service: General;  Laterality: N/A;     Medications Prior to Admission: Prior to Admission medications   Medication Sig Start Date End Date Taking? Authorizing Provider  Accu-Chek Softclix Lancets lancets USE UP TO FOUR TIMES DAILY AS DIRECTED 07/29/20   [name], NP  ALPRAZolam Duanne Moron) 0.5 MG tablet TAKE 2 TABLETS BY MOUTH TWICE A DAY AS NEEDED FOR ANXIETY 03/16/21    Cottle, Billey Co., MD  aspirin 81 MG EC tablet Take 1 tablet (81 mg total) by mouth daily. Swallow whole. 02/10/21   [name], NP  blood glucose meter kit and supplies KIT Dispense based on patient and insurance preference. Use up to four times daily as directed. (FOR ICD-9 250.00, 250.01). 07/09/20   Geradine Girt, DO  busPIRone (BUSPAR) 30 MG tablet TAKE 1 TABLET BY MOUTH TWICE A DAY 02/05/21   Cottle, Billey Co., MD  divalproex (DEPAKOTE ER) 500 MG 24 hr tablet Take 3 tablets (1,500 mg total) by mouth at bedtime. 03/05/20   Cottle, Billey Co., MD  Insulin Glargine [facility] KWIKPEN) 100 UNIT/ML Inject 12 Units into the skin daily. 10/13/20   [name], NP  Insulin Pen Needle (BD PEN NEEDLE NANO 2ND GEN) 32G X 4 MM MISC USE AS DIRECTED 05/04/21   [name], NP  metoprolol succinate (TOPROL-XL) 25 MG 24 hr tablet TAKE 1 TABLET BY MOUTH EVERY DAY 02/04/21   Hilty, Nadean Corwin, MD  OLANZapine (ZYPREXA) 20 MG tablet Take 1 tablet (20 mg total) by mouth at bedtime. 04/28/21   Cottle, Billey Co., MD  oxyCODONE (OXY IR/ROXICODONE) 5 MG immediate release tablet Take 1 tablet (5 mg total) by mouth every 6 (six) hours as needed for severe pain. Patient not taking: Reported on 02/18/2021 01/16/21   Dwan Bolt, MD  PARoxetine (PAXIL) 20 MG tablet TAKE 2 TABLETS BY MOUTH EVERY DAY 04/09/21   Cottle, Billey Co., MD  simvastatin (ZOCOR) 80 MG tablet TAKE 1 TABLET BY MOUTH EVERYDAY AT BEDTIME 04/20/21   Minus Breeding, MD  XIIDRA 5 % SOLN Place 1 drop into both eyes in the morning and at bedtime. 05/29/20   [provider]     Allergies:   No Known Allergies  Social History:   Social History   Socioeconomic History   Marital status: Legally Separated    Spouse name: Not on file   Number of children: Not on file   Years of education: Not on file   Highest education level: Not on file  Occupational History   Occupation: mortgage loan specialist  Tobacco Use    Smoking status: Some Days    Packs/day: 0.50    Years: 25.00    Pack years: 12.50    Types: Cigarettes   Smokeless tobacco: Never  Vaping Use   Vaping Use: Never used  Substance and Sexual Activity   Alcohol use: No    Alcohol/week: 0.0 standard drinks   Drug use: Never   Sexual activity: Yes    Partners: Male    Comment: married  Other Topics Concern   Not on file  Social History Narrative   Regular exercise:  3 days weekly   Caffeine Use:  1 soda daily   One child biological daughter born in 30 and an adopted niece.   [place] specialist   Married- may be divorcing.          Social Determinants of Health   Financial Resource Strain: Not on file  Food Insecurity:  Not on file  Transportation Needs: Not on file  Physical Activity: Not on file  Stress: Not on file  Social Connections: Not on file  Intimate Partner Violence: Not on file    Family History:   The patient's family history includes Diabetes in her father and paternal grandmother; Heart attack in her father; Hypertension in her father and paternal grandmother; Lupus in her mother. There is no history of Stroke, Kidney disease, Hyperlipidemia, or Sudden death.    ROS:  Please see the history of present illness.  All other ROS reviewed and negative.     Physical Exam/Data:   Vitals:   05/05/21 0627 05/05/21 0630 05/05/21 0645 05/05/21 0700  BP: (!) 151/79 137/83 119/78 115/78  Pulse: 81 85 88 89  Resp: '17 18 18 16  ' Temp:      TempSrc:      SpO2: 100% 98% 98% 97%  Weight:      Height:       No intake or output data in the 24 hours ending 05/05/21 0726 Last 3 Weights 05/05/2021 02/10/2021 01/16/2021  Weight (lbs) 144 lb 143 lb 140 lb  Weight (kg) 65.318 kg 64.864 kg 63.504 kg     Body mass index is 23.96 kg/m.  General:  Well nourished, well developed, in no acute distress HEENT: normal Neck: no JVD Vascular: No carotid bruits; Distal pulses 2+ bilaterally   Cardiac:  normal S1,  S2; RRR; no murmur  Lungs:  clear to auscultation bilaterally, no wheezing, rhonchi or rales  Abd: soft, nontender, no hepatomegaly  Ext: no edema Musculoskeletal:  No deformities, BUE and BLE strength normal and equal Skin: warm and dry  Neuro:  CNs 2-12 intact, no focal abnormalities noted Psych:  Normal affect    EKG:  The ECG that was done  was personally reviewed and demonstrates Normal   Relevant CV Studies:  Monitor 07/2020 Patient had a min HR of 60 bpm, max HR of 203 bpm, and avg HR of 88 bpm. Predominant underlying rhythm was Sinus Rhythm. 1 run of Ventricular Tachycardia occurred lasting 6 beats with a max rate of 176 bpm (avg 166 bpm). 1 run of Supraventricular Tachycardia occurred lasting 11 beats with a max rate of 203 bpm (avg 184 bpm). Isolated SVEs were rare (<1.0%), SVE Couplets were rare (<1.0%), and SVE Triplets were rare (<1.0%). Isolated VEs were rare (<1.0%), and no VE Couplets or VE Triplets were present.   Normal sinus rhythm One run of non sustained ventricular tachycardia with the longest being 11 beats One run of supraventricular tachycardia with the longest lasting six beats.   No sustained arrhythmias  Echo 07/2020 1. Left ventricular ejection fraction, by estimation, is 60 to 65%. The  left ventricle has normal function. The left ventricle has no regional  wall motion abnormalities. Left ventricular diastolic parameters were  normal. The average left ventricular  global longitudinal strain is -19.5 %.   2. Right ventricular systolic function is normal. The right ventricular  size is normal.   3. The mitral valve is normal in structure. No evidence of mitral valve  regurgitation.   4. The aortic valve is normal in structure. Aortic valve regurgitation is  not visualized. No aortic stenosis is present.   Comparison(s): 07/19/17 EF 55-60%.   Laboratory Data:  High Sensitivity Troponin:   Recent Labs  Lab 05/05/21 0241 05/05/21 0433  TROPONINIHS  757* 1,840*      Chemistry Recent Labs  Lab 05/05/21 0241  NA  132*  K 3.7  CL 100  CO2 23  GLUCOSE 179*  BUN 14  CREATININE 0.92  CALCIUM 9.3  GFRNONAA >60  ANIONGAP 9    Hematology Recent Labs  Lab 05/05/21 0241  WBC 8.8  RBC 4.34  HGB 13.0  HCT 38.6  MCV 88.9  MCH 30.0  MCHC 33.7  RDW 13.9  PLT 207   Radiology/Studies:  DG Chest 2 View  Result Date: 05/05/2021 CLINICAL DATA:  Chest pain radiating into the left arm. EXAM: CHEST - 2 VIEW COMPARISON:  07/07/2020 FINDINGS: Postsurgical changes are noted and stable. The lungs are well aerated bilaterally. No focal infiltrate or effusion is seen. No bony abnormality is noted. IMPRESSION: No acute abnormality seen. Electronically Signed   By: Inez Catalina M.D.   On: 05/05/2021 03:17   CT Angio Chest PE W and/or Wo Contrast  Result Date: 05/05/2021 CLINICAL DATA:  Pulmonary embolism suspected EXAM: CT ANGIOGRAPHY CHEST WITH CONTRAST TECHNIQUE: Multidetector CT imaging of the chest was performed using the standard protocol during bolus administration of intravenous contrast. Multiplanar CT image reconstructions and MIPs were obtained to evaluate the vascular anatomy. CONTRAST:  65m OMNIPAQUE IOHEXOL 350 MG/ML SOLN COMPARISON:  07/08/2020 FINDINGS: Cardiovascular: Satisfactory opacification of the pulmonary arteries to the segmental level. No evidence of pulmonary embolism. Normal heart size. No pericardial effusion. Extensive coronary atherosclerosis. CABG. Mediastinum/Nodes: No adenopathy or hematoma Lungs/Pleura: There is no edema, consolidation, effusion, or pneumothorax. Upper Abdomen: Low-density left adrenal thickening which is generalized in long-standing/benign. Musculoskeletal: No acute finding.  Multilevel disc space narrowing. Review of the MIP images confirms the above findings. IMPRESSION: Negative for pulmonary embolism or other acute finding. Atherosclerosis including the coronary arteries.  CABG. Electronically Signed    By: JJorje GuildM.D.   On: 05/05/2021 05:37    Assessment and Plan:   NSTEMI with history of CAD s/p CABG -Intermittent substernal chest pressure with and without exertion.  Worse episode yesterday leading to ER presentation.  -Troponin elevated 757>>1840. - Continue IV heparin and nitroglycerin drip.   - Continue ASA 820mqd, Toprol XL and statin   Shared Decision Making/Informed Consent The risks [stroke (1 in 1000), death (1 in 1000), kidney failure [usually temporary] (1 in 500), bleeding (1 in 200), allergic reaction [possibly serious] (1 in 200)], benefits (diagnostic support and management of coronary artery disease) and alternatives of a cardiac catheterization were discussed in detail with Ms. PeStruckmannd she is willing to proceed.   2. HTN - Continue home BB  3. HLD - 07/17/2020: Cholesterol, Total 139; HDL 58; LDL Chol Calc (NIH) 65; Triglycerides 80  - On Zocor 8023mt home >> Lipitor 24m48m. Check lipid panel  4. DM - SSI while admitted   Risk Assessment/Risk Scores:   TIMI Risk Score for Unstable Angina or Non-ST Elevation MI:   The patient's TIMI risk score is 5, which indicates a 26% risk of all cause mortality, new or recurrent myocardial infarction or need for urgent revascularization in the next 14 days.{   Severity of Illness: The appropriate patient status for this patient is INPATIENT. Inpatient status is judged to be reasonable and necessary in order to provide the required intensity of service to ensure the patient's safety. The patient's presenting symptoms, physical exam findings, and initial radiographic and laboratory data in the context of their chronic comorbidities is felt to place them at high risk for further clinical deterioration. Furthermore, it is not anticipated that the patient will be medically  stable for discharge from the hospital within 2 midnights of admission.   * I certify that at the point of admission it is my clinical judgment that  the patient will require inpatient hospital care spanning beyond 2 midnights from the point of admission due to high intensity of service, high risk for further deterioration and high frequency of surveillance required.*   For questions or updates, please contact [place] Please consult [email or web address] for contact info under     Jarrett Soho, [place]  05/05/2021 7:26 AM     Patient seen and examined. Agree with assessment and plan.  Ms. Dellene Mcgroarty is a 60 year old female who is followed by Dr. Percival Spanish for cardiology care.  She has a history of CAD and in 2000 06 underwent CABG revascularization utilizing a LIMA to the LAD, free RIMA graft to the diagonal, right radial artery to the PDA, and sequential SVG to OM1 and OM 2.  Patient has a long history of tobacco use and continues to smoke, currently 1/2 pack/day.  She developed a pulmonary embolus earlier this year and was on 6 months of Xarelto anticoagulation which was discontinued at the time of her hernia surgery in August.  She continues to work but predominantly works at home.  She admits that she has not been exercising.  She began to notice recurrent episodes of chest pain over the weekend both Saturday and Sunday.  Yesterday her chest pain became more constant and approximately midnight presented to the emergency room for evaluation.  She was started on heparin and intravenous nitroglycerin.  Initial troponin was elevated at 757 with subsequent increasing to 1840.  CT angio of her chest was negative for PE.  Currently she is pain-free.  On exam blood pressure is 118/78.  Rhythm is regular in the 70s.  HEENT is unremarkable.  There are no carotid bruits.  Lungs are clear.  Rhythm is regular.  There is no chest wall tenderness.  Abdomen was mildly protuberant but nontender.  Pulses were 2+.  Laboratories notable for sodium 132, glucose 179.  Creatinine 0.92.  Her ECG shows normal sinus rhythm at 95 bpm with QS complex V1 and V2  without diagnostic ST-T abnormalities.  She has ruled in for non-STEMI.  She is 16 years status post CABG revascularization was utilized 3 arterial conduits but had a sequential graft placed to her OM1 and OM2 vessels.  Unfortunately she has continued to smoke cigarettes.  With her  chest pain symptomatology definitive cardiac catheterization is recommended. I have reviewed the risks, indications, and alternatives to cardiac catheterization, possible angioplasty, and stenting with the patient. Risks include but are not limited to bleeding, infection, vascular injury, stroke, myocardial infection, arrhythmia, kidney injury, radiation-related injury in the case of prolonged fluoroscopy use, emergency cardiac surgery, and death. The patient understands the risks of serious complication is 1-2 in 4174 with diagnostic cardiac cath and 1-2% or less with angioplasty/stenting.  We will proceed with catheterization this morning.  Patient is NPO.   Troy Sine, MD, [facility] 05/05/2021 7:55 AM

## 2021-05-05 NOTE — Interval H&P Note (Signed)
History and Physical Interval Note:  05/05/2021 5:14 PM  Bailey Greene  has presented today for surgery, with the diagnosis of nstemi.  The various methods of treatment have been discussed with the patient and family. After consideration of risks, benefits and other options for treatment, the patient has consented to  Procedure(s): LEFT HEART CATH AND CORS/GRAFTS ANGIOGRAPHY (N/A)  PERCUTANEOUS CORONARY INTERVENTION  as a surgical intervention.  The patient's history has been reviewed, patient examined, no change in status, stable for surgery.  I have reviewed the patient's chart and labs.  Questions were answered to the patient's satisfaction.    Cath Lab Visit (complete for each Cath Lab visit)  Clinical Evaluation Leading to the Procedure:   ACS: Yes.    Non-ACS:    Anginal Classification: CCS III  Anti-ischemic medical therapy: Minimal Therapy (1 class of medications)  Non-Invasive Test Results: No non-invasive testing performed  Prior CABG: Previous CABG   Bryan Lemma

## 2021-05-05 NOTE — ED Triage Notes (Addendum)
Pt presents to ED BIB [facility] from home. Pt c/o L CP that radiates to L arm. Pain began on Saturday that was intermittent but now constant. Pain was initially 10 now down to 2/10. EMS given 324 ASA and nitro x. Hx MI and CABG

## 2021-05-05 NOTE — Progress Notes (Signed)
ANTICOAGULATION CONSULT NOTE - Initial Consult  Pharmacy Consult for Heparin  Indication: chest pain/ACS  No Known Allergies  Patient Measurements: Height: 5\' 5"  (165.1 cm) Weight: 65.3 kg (144 lb) IBW/kg (Calculated) : 57  Vital Signs: Temp: 98.6 F (37 C) (12/06 0229) Temp Source: Oral (12/06 0229) BP: 137/83 (12/06 0630) Pulse Rate: 85 (12/06 0630)  Labs: Recent Labs    05/05/21 0241 05/05/21 0433  HGB 13.0  --   HCT 38.6  --   PLT 207  --   CREATININE 0.92  --   TROPONINIHS 757* 1,840*    Estimated Creatinine Clearance: 60 mL/min (by C-G formula based on SCr of 0.92 mg/dL).   Medical History: 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   Qualifier: Diagnosis of  By: 04/02/2007     Diabetes mellitus type 2 in nonobese ([facility]) 07/2016   Dyslipidemia    Headache(784.0)    History of pulmonary embolism 07/08/2020   HTN (hypertension)    Migraine    Pulmonary embolus ([facility])    unprovoked     Assessment: 60 y/o F with chest pain and elevated troponin. Starting heparin per pharmacy. Was previously on Xarelto for PE, but currently NOT on anti-coagulation. CT angio today is negative. CBC/renal function good.   Goal of Therapy:  Heparin level 0.3-0.7 units/ml Monitor platelets by anticoagulation protocol: Yes   Plan:  Heparin 4000 units BOLUS Start heparin drip at 850 units/hr 1400 Heparin level Daily CBC/Heparin level Monitor for bleeding   41, PharmD, BCPS Clinical Pharmacist Phone: [phone]

## 2021-05-05 NOTE — Brief Op Note (Addendum)
  BRIEF CARDIAC CATHETERIZATION REPORT  05/05/2021 6:30 PM  PCP:  Sandford Craze, NP              [facility] Providers Cardiologist:  Rollene Rotunda, MD  Cardiology APP:  Cannon Kettle, PA-C Referring Cardiologist: Lennette Bihari, MD   PROCEDURE:  Procedure(s): LEFT HEART CATH AND CORS/GRAFTS ANGIOGRAPHY (N/A)  SURGEON:  Surgeon(s) and Role:    * Marykay Lex, MD - Primary  PATIENT:  Bailey Greene  60 y.o. female   PRE-OPERATIVE DIAGNOSIS:  nstemi  POST-OPERATIVE DIAGNOSIS:   Severe native CAD: Subtotal closing of mid RCA 95% ostial LAD; the LAD actually does course all the way to the apex with the main branch not grafted being a large septal perforator branch. Codominant LCx with bifurcating OM1 (recorded is OM1 OM 2) that are both small in caliber with 90% OM1 and 60% OM2 stenoses.  Retrograde filling of distal OM1 via the remaining OM1-OM2 vein graft is still present.  The remainder the LCx runs into the AV groove giving rise to 2 small caliber posterolateral branches with mild diffuse disease. 3.5 of 5 grafts patent Culprit lesion: 100% flush occlusion of SeqSVG-OM1-OM2 with patent OM1-OM2 limb Widely patent LIMA-distal LAD with 40% anastomotic lesion Widely patent free RIMA-Diag with 50% anastomotic lesion Widely patent Left Radial-RPDA with 60% mid-distal PDA disease.  Retrograde filling to small caliber posterolateral system and occluded distal RCA Normal LVEDP Heavily calcified right common femoral artery just proximal to bifurcation.  Estimate potential 50% stenosis.  The right SFA is 100% occluded just after the bifurcation with patent profunda artery that has collateralization. => Recommend PV consult   PROCEDURE PERFORMED: Time Out: Verified patient identification, verified procedure, site/side was marked, verified correct patient position, special equipment/implants available, medications/allergies/relevent history reviewed, required imaging  and test results available. Performed.  Access:  *Right Common Femoral Artery: 5 Fr Sheath - fluoroscopically guided modified Seldinger Technique   Left Heart Catheterization: 5 Fr Catheters advanced or exchanged over a J-wire under direct fluoroscopic guidance into the ascending aorta; JL 4 catheter advanced first.  * LV Hemodynamics (without LV Gram): Angled pigtail catheter * Left Coronary Artery Cineangiography: JL 4 catheter  * Right Coronary Artery, seqSVG-OM1-OM2 , FrLRad-rPDA, FrRIMA-Diag Cineangiography: JR4 Catheter  * LIMA-LAD Cineangiography: JR4 Catheter was redirected into Left Subclavian Artery & advanced over J wire f wire was used to subselectively engage the LIMA graft.Marland Kitchen   Upon completion of Angiogaphy, the catheter was removed completely out of the body over a wire, without complication.  Femoral Sheath(s) will be removed in the PACU holding area with manual pressure for hemostasis.   MEDICATIONS SQ Lidocaine 15 mL Radial Cocktail: 3 mg Verapmil in 10 mL NS  EBL:  <50 mL No Complications    DICTATION: .Note written in EPIC  PLAN OF CARE:  -Return to nursing unit for ongoing care.  Does not necessarily require further IV heparin.  Medical management with guideline directed medical therapy. With ACS presentation, I did load with Plavix 300 mg and will start 75 mg daily Plavix (okay to interrupt) for 1 year.  Images reviewed with Dr. Tresa Endo.  PATIENT DISPOSITION:  PACU - hemodynamically stable.   Delay start of Pharmacological VTE agent (>24hrs) due to surgical blood loss or risk of bleeding: not applicable     Bryan Lemma, MD

## 2021-05-05 NOTE — ED Provider Notes (Signed)
Emergency Medicine Provider Triage Evaluation Note  Bailey Greene , a 60 y.o. female  was evaluated in triage.  Pt complains of chest pain x 3 days. Initially intermittent, now constant, feels like a heaviness to the left chest radiating into the LUE. Given aspirin & nitroglycerin by EMS with improvement however now pain increasing again, 4/10 in severity.   Review of Systems  Positive: Chest pain, lightheaded Negative: Syncope, vomiting  Physical Exam  BP 101/75   Pulse 96   Temp 98.6 F (37 C) (Oral)   Resp 16   Ht 5\' 5"  (1.651 m)   Wt 65.3 kg   LMP 11/28/2009   SpO2 98%   BMI 23.96 kg/m  Gen:   Awake, no distress   Resp:  Normal effort  MSK:   Moves extremities without difficulty  Other:  2+ symmetric pulses.   Medical Decision Making  Medically screening exam initiated at 3:09 AM.  Appropriate orders placed.  Desia Saban was informed that the remainder of the evaluation will be completed by another provider, this initial triage assessment does not replace that evaluation, and the importance of remaining in the ED until their evaluation is complete.  Current pain 4/10, BP 118/79- will give SL nitroglycerin and recheck EKG.  Labs including troponin pending.   Chest pain.    6/10, PA-C 05/05/21 0355    14/06/22, MD 05/05/21 [id number]

## 2021-05-06 ENCOUNTER — Other Ambulatory Visit (HOSPITAL_COMMUNITY): Payer: Self-pay

## 2021-05-06 ENCOUNTER — Encounter (HOSPITAL_COMMUNITY): Payer: Self-pay | Admitting: Cardiology

## 2021-05-06 DIAGNOSIS — Z720 Tobacco use: Secondary | ICD-10-CM | POA: Diagnosis not present

## 2021-05-06 DIAGNOSIS — Z951 Presence of aortocoronary bypass graft: Secondary | ICD-10-CM | POA: Diagnosis not present

## 2021-05-06 DIAGNOSIS — I214 Non-ST elevation (NSTEMI) myocardial infarction: Secondary | ICD-10-CM | POA: Diagnosis not present

## 2021-05-06 DIAGNOSIS — E785 Hyperlipidemia, unspecified: Secondary | ICD-10-CM | POA: Diagnosis not present

## 2021-05-06 LAB — LIPID PANEL
Cholesterol: 137 mg/dL (ref 0–200)
HDL: 49 mg/dL (ref >40)
LDL Cholesterol: 65 mg/dL (ref 0–99)
Total CHOL/HDL Ratio: 2.8 RATIO
Triglycerides: 113 mg/dL (ref <150)
VLDL: 23 mg/dL (ref 0–40)

## 2021-05-06 LAB — GLUCOSE, CAPILLARY
Glucose-Capillary: 139 mg/dL — ABNORMAL HIGH (ref 70–99)
Glucose-Capillary: 156 mg/dL — ABNORMAL HIGH (ref 70–99)
Glucose-Capillary: 98 mg/dL (ref 70–99)
Glucose-Capillary: 165 mg/dL — ABNORMAL HIGH (ref 70–99)

## 2021-05-06 LAB — BASIC METABOLIC PANEL
Anion gap: 9 (ref 5–15)
BUN: 11 mg/dL (ref 6–20)
CO2: 23 mmol/L (ref 22–32)
Calcium: 9.2 mg/dL (ref 8.9–10.3)
Chloride: 108 mmol/L (ref 98–111)
Creatinine, Ser: 0.77 mg/dL (ref 0.44–1.00)
GFR, Estimated: >60 mL/min (ref >60)
Glucose, Bld: 116 mg/dL — ABNORMAL HIGH (ref 70–99)
Potassium: 3.9 mmol/L (ref 3.5–5.1)
Sodium: 140 mmol/L (ref 135–145)

## 2021-05-06 LAB — HEMOGLOBIN A1C
Hgb A1c MFr Bld: 6.6 % — ABNORMAL HIGH (ref 4.8–5.6)
Mean Plasma Glucose: 142.72 mg/dL

## 2021-05-06 LAB — CBC
HCT: 36.4 % (ref 36.0–46.0)
Hemoglobin: 12.5 g/dL (ref 12.0–15.0)
MCH: 30 pg (ref 26.0–34.0)
MCHC: 34.3 g/dL (ref 30.0–36.0)
MCV: 87.3 fL (ref 80.0–100.0)
Platelets: 180 10*3/uL (ref 150–400)
RBC: 4.17 MIL/uL (ref 3.87–5.11)
RDW: 14.2 % (ref 11.5–15.5)
WBC: 10.3 10*3/uL (ref 4.0–10.5)
nRBC: 0 % (ref 0.0–0.2)

## 2021-05-06 LAB — TROPONIN I (HIGH SENSITIVITY): Troponin I (High Sensitivity): 3359 ng/L (ref <18)

## 2021-05-06 MED ORDER — ATORVASTATIN CALCIUM 80 MG PO TABS
80.0000 mg | ORAL_TABLET | Freq: Every day | ORAL | 3 refills | Status: DC
  Filled 2021-05-06: qty 30, 30d supply, fill #0

## 2021-05-06 MED ORDER — ATORVASTATIN CALCIUM 80 MG PO TABS
80.0000 mg | ORAL_TABLET | Freq: Every day | ORAL | Status: DC
  Administered 2021-05-07: 80 mg via ORAL
  Filled 2021-05-06: qty 1

## 2021-05-06 MED ORDER — DAPAGLIFLOZIN PROPANEDIOL 10 MG PO TABS
10.0000 mg | ORAL_TABLET | Freq: Every day | ORAL | 11 refills | Status: DC
  Filled 2021-05-06: qty 30, 30d supply, fill #0

## 2021-05-06 MED ORDER — NITROGLYCERIN 0.4 MG SL SUBL
0.4000 mg | SUBLINGUAL_TABLET | SUBLINGUAL | 2 refills | Status: DC | PRN
  Filled 2021-05-06: qty 25, 7d supply, fill #0

## 2021-05-06 MED ORDER — CLOPIDOGREL BISULFATE 75 MG PO TABS
75.0000 mg | ORAL_TABLET | Freq: Every day | ORAL | 3 refills | Status: DC
  Filled 2021-05-06: qty 30, 30d supply, fill #0

## 2021-05-06 NOTE — Progress Notes (Signed)
CARDIAC REHAB PHASE I   PRE:  Rate/Rhythm: 82 SR    BP: sitting 125/83    SaO2:   MODE:  Ambulation: 472ft   POST:  Rate/Rhythm: 102 ST    BP: sitting 131/81     SaO2:    Tolerated well, no c/o. Discussed MI, restrictions, diet, exercise importance, smoking cessation, NTG and CRPII. Pt receptive. She wants to quit smoking but is thinking of cutting down. Resources given. Will refer to [place] CRPII.  [id number]  Harriet Masson CES, ACSM 05/06/2021 10:40 AM

## 2021-05-06 NOTE — Progress Notes (Incomplete)
Hello,  The Pharmacy team is conducting a discharge transitions of care quality improvement initiative. The recommendations below are for your consideration.    Bailey Greene is a 60 y.o. female (MRN: [id number], DOB: [birth year]) who was recently hospitalized on 05/05/2021 for NSTEMI. They are anticipated to visit your clinic for post-discharge follow-up and may benefit from assistance with medication initiation and/or access.     Relevant medication access issues which may benefit from further intervention include:   Please consider the following therapy recommendations at follow-up appointment below:   -With history of DM, consider adding an ACE/ARB is able  Other relevant medication issues from their recent admission include: -She has tried metformin in the past but was intolerant   We appreciate your assistance with the implementation of these recommendations. Please let us know if there is anything we can help you with at this time.       Thank you,   Harland German, PharmD Clinical Pharmacist **Pharmacist phone directory can now be found on [email or web address] ([email or web address]).  Listed under [facility].

## 2021-05-06 NOTE — Progress Notes (Addendum)
Progress Note  Patient Name: Bailey Greene Date of Encounter: 05/06/2021  [facility] Cardiologist: Minus Breeding, MD   Subjective   Feeling well this morning. No chest pain overnight.   Inpatient Medications    Scheduled Meds:  aspirin EC  81 mg Oral Daily   atorvastatin  40 mg Oral Daily   busPIRone  30 mg Oral BID   clopidogrel  75 mg Oral Q breakfast   divalproex  1,500 mg Oral QHS   insulin aspart  0-15 Units Subcutaneous TID WC   metoprolol succinate  25 mg Oral Daily   OLANZapine  20 mg Oral QHS   PARoxetine  40 mg Oral Daily   sodium chloride flush  3 mL Intravenous Q12H   Continuous Infusions:  sodium chloride     sodium chloride 1 mL/kg/hr (05/05/21 0931)   nitroGLYCERIN Stopped (05/05/21 1802)   PRN Meds: sodium chloride, acetaminophen, nitroGLYCERIN, ondansetron (ZOFRAN) IV, sodium chloride flush   Vital Signs    Vitals:   05/05/21 2358 05/06/21 0357 05/06/21 0730 05/06/21 0859  BP: 116/75 126/83 127/81 137/82  Pulse: 87 76 90 80  Resp: 20 18 17    Temp:   98.9 F (37.2 C)   TempSrc:   Oral   SpO2: 98% 97% 97%   Weight:      Height:        Intake/Output Summary (Last 24 hours) at 05/06/2021 0934 Last data filed at 05/06/2021 0600 Gross per 24 hour  Intake 1066.44 ml  Output --  Net 1066.44 ml   Last 3 Weights 05/05/2021 02/10/2021 01/16/2021  Weight (lbs) 144 lb 143 lb 140 lb  Weight (kg) 65.318 kg 64.864 kg 63.504 kg      Telemetry    Sinus rhythm - Personally Reviewed  ECG    SR - Personally Reviewed  Physical Exam   GEN: No acute distress.   Neck: No JVD Cardiac: RRR, no murmurs, rubs, or gallops.  Respiratory: Clear to auscultation bilaterally. GI: Soft, nontender, non-distended  MS: No edema; No deformity. Right femoral cath site Neuro:  Nonfocal  Psych: Normal affect   Labs    High Sensitivity Troponin:   Recent Labs  Lab 05/05/21 0241 05/05/21 0433 05/06/21 0351  TROPONINIHS 757* 1,840* 3,359*      Chemistry Recent Labs  Lab 05/05/21 0241 05/06/21 0351  NA 132* 140  K 3.7 3.9  CL 100 108  CO2 23 23  GLUCOSE 179* 116*  BUN 14 11  CREATININE 0.92 0.77  CALCIUM 9.3 9.2  GFRNONAA >60 >60  ANIONGAP 9 9    Lipids  Recent Labs  Lab 05/06/21 0351  CHOL 137  TRIG 113  HDL 49  LDLCALC 65  CHOLHDL 2.8    Hematology Recent Labs  Lab 05/05/21 0241 05/06/21 0351  WBC 8.8 10.3  RBC 4.34 4.17  HGB 13.0 12.5  HCT 38.6 36.4  MCV 88.9 87.3  MCH 30.0 30.0  MCHC 33.7 34.3  RDW 13.9 14.2  PLT 207 180   Thyroid No results for input(s): TSH, FREET4 in the last 168 hours.  BNPNo results for input(s): BNP, PROBNP in the last 168 hours.  DDimer No results for input(s): DDIMER in the last 168 hours.   Radiology    DG Chest 2 View  Result Date: 05/05/2021 CLINICAL DATA:  Chest pain radiating into the left arm. EXAM: CHEST - 2 VIEW COMPARISON:  07/07/2020 FINDINGS: Postsurgical changes are noted and stable. The lungs are well aerated bilaterally. No  focal infiltrate or effusion is seen. No bony abnormality is noted. IMPRESSION: No acute abnormality seen. Electronically Signed   By: Alcide Clever M.D.   On: 05/05/2021 03:17   CT Angio Chest PE W and/or Wo Contrast  Result Date: 05/05/2021 CLINICAL DATA:  Pulmonary embolism suspected EXAM: CT ANGIOGRAPHY CHEST WITH CONTRAST TECHNIQUE: Multidetector CT imaging of the chest was performed using the standard protocol during bolus administration of intravenous contrast. Multiplanar CT image reconstructions and MIPs were obtained to evaluate the vascular anatomy. CONTRAST:  9mL OMNIPAQUE IOHEXOL 350 MG/ML SOLN COMPARISON:  07/08/2020 FINDINGS: Cardiovascular: Satisfactory opacification of the pulmonary arteries to the segmental level. No evidence of pulmonary embolism. Normal heart size. No pericardial effusion. Extensive coronary atherosclerosis. CABG. Mediastinum/Nodes: No adenopathy or hematoma Lungs/Pleura: There is no edema,  consolidation, effusion, or pneumothorax. Upper Abdomen: Low-density left adrenal thickening which is generalized in long-standing/benign. Musculoskeletal: No acute finding.  Multilevel disc space narrowing. Review of the MIP images confirms the above findings. IMPRESSION: Negative for pulmonary embolism or other acute finding. Atherosclerosis including the coronary arteries.  CABG. Electronically Signed   By: Tiburcio Pea M.D.   On: 05/05/2021 05:37   CARDIAC CATHETERIZATION  Result Date: 05/05/2021   -----------------NATIVE VESSELS----------------   Ost LAD to Prox LAD lesion is 90% stenosed.   1st Diag lesion is 100% stenosed.   1st Mrg lesion is 90% stenosed. (OM1)   Lat 1st Mrg lesion is 60% stenosed. (OM2) - gives collateral (via residual Sequential SVG limb to OM1)   Mid RCA to Dist RCA lesion is 100% stenosed.   -----------------GRAFTS----------------   LIMA-LAD graft was visualized by angiography and is normal in caliber. Insertion lesion is 40% stenosed &  Dist LAD lesion is 40% stenosed at the anastomosis   Seq SVG- OM1-OM2 graft was not visualized due to known occlusion. Origin to Prox Graft lesion before 1st Mrg  is 100% stenosed. ->  Sequential limb from OM1 to OM 2 is patent with retrograde flow from OM2-OM1   FreeRIMA-1st Diag graft was visualized by angiography and is small.  Insertion lesion is 50% stenosed - otherwise, The graft exhibits no disease.   Left Radial Artery Graft-rPDA, was visualized by angiography.  The graft exhibits no disease.   -----------------HEOMDYNAMICS----------------   LV end diastolic pressure is normal. POST-OPERATIVE DIAGNOSIS:  Severe native CAD: Subtotal CTO of mid RCA 90% ostial LAD; the LAD actually does course all the way to the apex with the main branch not grafted being a large septal perforator branch. Codominant LCx with bifurcating OM1 (recorded is OM1 OM 2) that are both small in caliber with 90% OM1 and 60% OM2 stenoses.  Retrograde filling of distal  OM1 via the remaining OM1-OM2 vein graft is still present.  The remainder the LCx runs into the AV groove giving rise to 2 small caliber posterolateral branches with mild diffuse disease. 3.5 of 5 grafts patent Culprit lesion: 100% flush occlusion of SeqSVG-OM1-OM2 with patent OM1-OM2 limb Widely patent LIMA-distal LAD with 40% anastomotic lesion Widely patent free RIMA-Diag with 50% anastomotic lesion Widely patent Left Radial-RPDA with 60% mid-distal PDA disease.  Retrograde filling to small caliber posterolateral system and occluded distal RCA Normal LVEDP Heavily calcified right common femoral artery just proximal to bifurcation.  Estimate potential 50% stenosis.  The right SFA is 100% occluded just after the bifurcation with patent profunda artery that has collateralization. => Recommend PV consult   PLAN OF CARE:  Return to nursing unit for ongoing care.  Does not necessarily require further IV heparin.  Medical management with guideline directed medical therapy. With ACS presentation, I did load with Plavix 300 mg and will start 75 mg daily Plavix (okay to interrupt) for 1 year. Images reviewed with Dr. Claiborne Billings.  PATIENT DISPOSITION:  PACU - hemodynamically stable.    Cardiac Studies   Cath: 05/05/21    -----------------NATIVE VESSELS----------------   Ost LAD to Prox LAD lesion is 90% stenosed.   1st Diag lesion is 100% stenosed.   1st Mrg lesion is 90% stenosed. (OM1)   Lat 1st Mrg lesion is 60% stenosed. (OM2) - gives collateral (via residual Sequential SVG limb to OM1)   Mid RCA to Dist RCA lesion is 100% stenosed.   -----------------GRAFTS----------------   LIMA-LAD graft was visualized by angiography and is normal in caliber. Insertion lesion is 40% stenosed &  Dist LAD lesion is 40% stenosed at the anastomosis   Seq SVG- OM1-OM2 graft was not visualized due to known occlusion. Origin to Prox Graft lesion before 1st Mrg  is 100% stenosed. ->  Sequential limb from OM1 to OM 2 is patent with  retrograde flow from OM2-OM1   FreeRIMA-1st Diag graft was visualized by angiography and is small.  Insertion lesion is 50% stenosed - otherwise, The graft exhibits no disease.   Left Radial Artery Graft-rPDA, was visualized by angiography.  The graft exhibits no disease.   -----------------HEOMDYNAMICS----------------   LV end diastolic pressure is normal.   POST-OPERATIVE DIAGNOSIS:   Severe native CAD: Subtotal CTO of mid RCA 90% ostial LAD; the LAD actually does course all the way to the apex with the main branch not grafted being a large septal perforator branch. Codominant LCx with bifurcating OM1 (recorded is OM1 OM 2) that are both small in caliber with 90% OM1 and 60% OM2 stenoses.  Retrograde filling of distal OM1 via the remaining OM1-OM2 vein graft is still present.  The remainder the LCx runs into the AV groove giving rise to 2 small caliber posterolateral branches with mild diffuse disease. 3.5 of 5 grafts patent Culprit lesion: 100% flush occlusion of SeqSVG-OM1-OM2 with patent OM1-OM2 limb Widely patent LIMA-distal LAD with 40% anastomotic lesion Widely patent free RIMA-Diag with 50% anastomotic lesion Widely patent Left Radial-RPDA with 60% mid-distal PDA disease.  Retrograde filling to small caliber posterolateral system and occluded distal RCA Normal LVEDP Heavily calcified right common femoral artery just proximal to bifurcation.  Estimate potential 50% stenosis.  The right SFA is 100% occluded just after the bifurcation with patent profunda artery that has collateralization. => Recommend PV consult     PLAN OF CARE:   Return to nursing unit for ongoing care.  Does not necessarily require further IV heparin.  Medical management with guideline directed medical therapy. With ACS presentation, I did load with Plavix 300 mg and will start 75 mg daily Plavix (okay to interrupt) for 1 year.   Images reviewed with Dr. Claiborne Billings.   PATIENT DISPOSITION:  PACU - hemodynamically  stable.   Diagnostic Dominance: Co-dominant   Patient Profile     60 y.o. female with history of CAD s/p CABG, hypertension, hyperlipidemia, diabetes mellitus, bipolar disorder, pulmonary embolism (completed 6 months of anticoagulation) and anxiety who is being seen 05/05/2021 for the evaluation of chest pain and found to have elevated troponin consistent with non-STEMI.  Assessment & Plan    NSTEMI: Serial troponin peaked at 3359.  Underwent cardiac catheterization noted above with severe multivessel native CAD, 3/5 grafts patent (LIMA to  LAD, free RIMA to diagonal, left radial to RPDA) culprit lesion felt to be 100% occluded SVG to OM 1-OM2, with patent OM1 to OM2 limb.  Medical therapy recommended.  IV heparin was stopped as well as IV nitroglycerin.  She was loaded with Plavix 300 mg with plans to start 75 mg daily for 1 year.  No recurrent chest pain overnight. --Continue aspirin, Plavix, switch to atorvastatin 80 mg daily, metoprolol 25 mg daily  Hypertension: Continue metoprolol 25 mg daily  Hyperlipidemia: Previously on Zocor 80 mg daily PTA -- LDL 65, HDL 40 --Transitioned to Lipitor 80 mg daily  Diabetes: Hgb A1c 6.6 --Insulin and Januvia PTA --Start Farxiga at discharge   For questions or updates, please contact [place] Please consult [email or web address] for contact info under        Signed, Reino Bellis, NP  05/06/2021, 9:34 AM       Patient seen and examined. Agree with assessment and plan.  Feels better today without recurrent chest tightness.  She is now on Plavix in addition to her aspirin.  Resting heart rate is in the upper 80s.  We will increase metoprolol succinate to 50 mg daily.  She is day 1 status post MI secondary to sequential vein graft occlusion to her marginal vessels.  I personally reviewed the angiographic images and went into the laboratory yesterday with Dr. Ellyn Hack during the procedure.  Right groin cath site stable without ecchymosis or  hematoma.  Arterial conduits remain widely patent.  DC simvastatin and change to either atorvastatin 80 mg or rosuvastatin 40 mg.  If recurrent angina, can initiate isosorbide.  I discussed the importance of complete smoking cessation.  Plan to monitor today with plans for discharge tomorrow.   Troy Sine, MD, [facility] 05/06/2021 10:50 AM

## 2021-05-06 NOTE — TOC Benefit Eligibility Note (Signed)
Patient Product/process development scientist completed.    The patient is currently admitted and upon discharge could be taking Jardiance 10 mg.  The current 30 day co-pay is, $0.00.   The patient is currently admitted and upon discharge could be taking Farxiga 10 mg.  The current 30 day co-pay is, $0.00.   The patient is insured through [employer]     Roland Earl, CPhT Pharmacy Patient Advocate Specialist [facility] Pharm[facility] Patient Advocate Team Direct Number: [phone]  Fax: [phone]

## 2021-05-07 ENCOUNTER — Other Ambulatory Visit (HOSPITAL_COMMUNITY): Payer: Self-pay

## 2021-05-07 ENCOUNTER — Telehealth (HOSPITAL_COMMUNITY): Payer: Self-pay | Admitting: Pharmacist

## 2021-05-07 LAB — CBC
HCT: 37.3 % (ref 36.0–46.0)
Hemoglobin: 12.8 g/dL (ref 12.0–15.0)
MCH: 29.6 pg (ref 26.0–34.0)
MCHC: 34.3 g/dL (ref 30.0–36.0)
MCV: 86.1 fL (ref 80.0–100.0)
Platelets: 199 10*3/uL (ref 150–400)
RBC: 4.33 MIL/uL (ref 3.87–5.11)
RDW: 13.8 % (ref 11.5–15.5)
WBC: 9.7 10*3/uL (ref 4.0–10.5)
nRBC: 0 % (ref 0.0–0.2)

## 2021-05-07 LAB — GLUCOSE, CAPILLARY: Glucose-Capillary: 157 mg/dL — ABNORMAL HIGH (ref 70–99)

## 2021-05-07 MED ORDER — METOPROLOL SUCCINATE ER 25 MG PO TB24
37.5000 mg | ORAL_TABLET | Freq: Every day | ORAL | 11 refills | Status: DC
  Filled 2021-05-07: qty 30, 20d supply, fill #0

## 2021-05-07 MED ORDER — LISINOPRIL 10 MG PO TABS
10.0000 mg | ORAL_TABLET | Freq: Every day | ORAL | 11 refills | Status: DC
  Filled 2021-05-07: qty 30, 30d supply, fill #0

## 2021-05-07 MED ORDER — LISINOPRIL 10 MG PO TABS: 10.0000 mg | ORAL_TABLET | Freq: Every day | ORAL | Status: DC

## 2021-05-07 NOTE — Plan of Care (Signed)

## 2021-05-07 NOTE — Discharge Summary (Addendum)
Discharge Summary    Patient ID: Bailey Greene MRN: [id number]; DOB: 1960-11-09  Admit date: 05/05/2021 Discharge date: 05/07/2021  PCP:  [name], NP   [facility] Providers Cardiologist:  Minus Breeding, MD  Cardiology APP:  Warren Lacy, PA-C  {  Discharge Diagnoses    Principal Problem:   NSTEMI (non-ST elevated myocardial infarction) [facility]) Active Problems:   Hx of CABG   Tobacco abuse   Hyperlipidemia with target LDL less than 70   HTN (hypertension)   Diabetes mellitus type 2 in nonobese [facility])   Diagnostic Studies/Procedures    Cath: 05/05/21     -----------------NATIVE VESSELS----------------   Colon Flattery LAD to Prox LAD lesion is 90% stenosed.   1st Diag lesion is 100% stenosed.   1st Mrg lesion is 90% stenosed. (OM1)   Lat 1st Mrg lesion is 60% stenosed. (OM2) - gives collateral (via residual Sequential SVG limb to OM1)   Mid RCA to Dist RCA lesion is 100% stenosed.   -----------------GRAFTS----------------   LIMA-LAD graft was visualized by angiography and is normal in caliber. Insertion lesion is 40% stenosed &  Dist LAD lesion is 40% stenosed at the anastomosis   Seq SVG- OM1-OM2 graft was not visualized due to known occlusion. Origin to Prox Graft lesion before 1st Mrg  is 100% stenosed. ->  Sequential limb from OM1 to OM 2 is patent with retrograde flow from OM2-OM1   FreeRIMA-1st Diag graft was visualized by angiography and is small.  Insertion lesion is 50% stenosed - otherwise, The graft exhibits no disease.   Left Radial Artery Graft-rPDA, was visualized by angiography.  The graft exhibits no disease.   -----------------HEOMDYNAMICS----------------   LV end diastolic pressure is normal.   POST-OPERATIVE DIAGNOSIS:   Severe native CAD: Subtotal CTO of mid RCA 90% ostial LAD; the LAD actually does course all the way to the apex with the main branch not grafted being a large septal perforator branch. Codominant LCx with bifurcating OM1  (recorded is OM1 OM 2) that are both small in caliber with 90% OM1 and 60% OM2 stenoses.  Retrograde filling of distal OM1 via the remaining OM1-OM2 vein graft is still present.  The remainder the LCx runs into the AV groove giving rise to 2 small caliber posterolateral branches with mild diffuse disease. 3.5 of 5 grafts patent Culprit lesion: 100% flush occlusion of SeqSVG-OM1-OM2 with patent OM1-OM2 limb Widely patent LIMA-distal LAD with 40% anastomotic lesion Widely patent free RIMA-Diag with 50% anastomotic lesion Widely patent Left Radial-RPDA with 60% mid-distal PDA disease.  Retrograde filling to small caliber posterolateral system and occluded distal RCA Normal LVEDP Heavily calcified right common femoral artery just proximal to bifurcation.  Estimate potential 50% stenosis.  The right SFA is 100% occluded just after the bifurcation with patent profunda artery that has collateralization. => Recommend PV consult     PLAN OF CARE:   Return to nursing unit for ongoing care.  Does not necessarily require further IV heparin.  Medical management with guideline directed medical therapy. With ACS presentation, I did load with Plavix 300 mg and will start 75 mg daily Plavix (okay to interrupt) for 1 year.   Images reviewed with Dr. Claiborne Billings.   PATIENT DISPOSITION:  PACU - hemodynamically stable.   Diagnostic Dominance: Co-dominant  _____________   History of Present Illness     Bailey Greene is a 60 y.o. female with history of CAD s/p CABG, hypertension, hyperlipidemia, diabetes mellitus, bipolar disorder, pulmonary embolism (completed 6 months of  anticoagulation) and anxiety who is being seen 05/05/2021 for the evaluation of chest pain and found to have elevated troponin consistent with non-STEMI.   Hx of CAD s/p CABG x5 (LIMA-LAD, RIMA-Diag, sequential SVG-OM1-OM2, right radial-PDA) in 2006 with cardiac cath in 2008 showing patent grafts. Patient has a long-standing history of sharp  chest pain in the setting of emotional stress. Most recent ischemic evaluation was a Myoview in 07/2017 which was low risk with no evidence of ischemia.    Diagnosed February 2022 for acute PE.  Treated with Eliquis for anticoagulation.   Last echocardiogram August 14, 2020 showed LV function of 60 to 65%, no regional wall motion abnormality.   Monitor 07/2020 showed normal sinus rhythm.  1 run of nonsustained VT for 11 beats.    Hospital Course      NSTEMI: Serial troponin peaked at 3359.  Underwent cardiac catheterization noted above with severe multivessel native CAD, 3/5 grafts patent (LIMA to LAD, free RIMA to diagonal, left radial to RPDA) culprit lesion felt to be 100% occluded SVG to OM 1-OM2, with patent OM1 to OM2 limb.  Medical therapy recommended.  IV heparin was stopped as well as IV nitroglycerin.  She was loaded with Plavix 300 mg with plans to start 75 mg daily for 1 year.  No recurrent chest pain. Worked with cardiac rehab. --Continue aspirin, Plavix, switched to atorvastatin 80 mg daily, metoprolol 25 mg daily   Hypertension: Metoprolol increased to 37.5mg  daily  -- Added lisinopril 10mg  daily given her hx of DM   Hyperlipidemia: Previously on Zocor 80 mg daily PTA -- LDL 65, HDL 40 --Transitioned to Lipitor 80 mg daily -- will need FLP/LFTs in 8 weeks   Diabetes: Hgb A1c 6.6 --Insulin and Januvia PTA --Start Farxiga at discharge  Tobacco use: cessation advised  General: Well developed, well nourished, female appearing in no acute distress. Head: Normocephalic, atraumatic.  Neck: Supple without bruits, JVD. Lungs:  Resp regular and unlabored, CTA. Heart: RRR, S1, S2, no S3, S4, or murmur; no rub. Abdomen: Soft, non-tender, non-distended with normoactive bowel sounds. No hepatomegaly. No rebound/guarding. No obvious abdominal masses. Extremities: No clubbing, cyanosis, edema. Distal pedal pulses are 2+ bilaterally. Right femoral cath site stable without bruising or  hematoma Neuro: Alert and oriented X 3. Moves all extremities spontaneously. Psych: Normal affect.  Did the patient have an acute coronary syndrome (MI, NSTEMI, STEMI, etc) this admission?:  Yes                               AHA/ACC Clinical Performance & Quality Measures: Aspirin prescribed? - Yes ADP Receptor Inhibitor (Plavix/Clopidogrel, Brilinta/Ticagrelor or Effient/Prasugrel) prescribed (includes medically managed patients)? - Yes Beta Blocker prescribed? - Yes High Intensity Statin (Lipitor 40-80mg  or Crestor 20-40mg ) prescribed? - Yes EF assessed during THIS hospitalization? - Yes For EF <40%, was ACEI/ARB prescribed? - Not Applicable (EF >/= 54%) For EF <40%, Aldosterone Antagonist (Spironolactone or Eplerenone) prescribed? - Not Applicable (EF >/= 56%) Cardiac Rehab Phase II ordered (including medically managed patients)? - Yes       The patient will be scheduled for a TOC follow up appointment in 10-14 days.  A message has been sent to the [facility] and Scheduling Pool at the office where the patient should be seen for follow up.  _____________  Discharge Vitals Blood pressure 131/76, pulse 83, temperature 98.6 F (37 C), temperature source Oral, resp. rate 15, height 5\' 5"  (  1.651 m), weight 65.3 kg, last menstrual period 11/28/2009, SpO2 99 %.  Filed Weights   05/05/21 0233  Weight: 65.3 kg    Labs & Radiologic Studies    CBC Recent Labs    05/06/21 0351 05/07/21 0149  WBC 10.3 9.7  HGB 12.5 12.8  HCT 36.4 37.3  MCV 87.3 86.1  PLT 180 625   Basic Metabolic Panel Recent Labs    05/05/21 0241 05/06/21 0351  NA 132* 140  K 3.7 3.9  CL 100 108  CO2 23 23  GLUCOSE 179* 116*  BUN 14 11  CREATININE 0.92 0.77  CALCIUM 9.3 9.2   Liver Function Tests No results for input(s): AST, ALT, ALKPHOS, BILITOT, PROT, ALBUMIN in the last 72 hours. No results for input(s): LIPASE, AMYLASE in the last 72 hours. High Sensitivity Troponin:   Recent Labs  Lab  05/05/21 0241 05/05/21 0433 05/06/21 0351  TROPONINIHS 757* 1,840* 3,359*    BNP Invalid input(s): POCBNP D-Dimer No results for input(s): DDIMER in the last 72 hours. Hemoglobin A1C Recent Labs    05/06/21 0351  HGBA1C 6.6*   Fasting Lipid Panel Recent Labs    05/06/21 0351  CHOL 137  HDL 49  LDLCALC 65  TRIG 113  CHOLHDL 2.8   Thyroid Function Tests No results for input(s): TSH, T4TOTAL, T3FREE, THYROIDAB in the last 72 hours.  Invalid input(s): FREET3 _____________  DG Chest 2 View  Result Date: 05/05/2021 CLINICAL DATA:  Chest pain radiating into the left arm. EXAM: CHEST - 2 VIEW COMPARISON:  07/07/2020 FINDINGS: Postsurgical changes are noted and stable. The lungs are well aerated bilaterally. No focal infiltrate or effusion is seen. No bony abnormality is noted. IMPRESSION: No acute abnormality seen. Electronically Signed   By: Inez Catalina M.D.   On: 05/05/2021 03:17   CT Angio Chest PE W and/or Wo Contrast  Result Date: 05/05/2021 CLINICAL DATA:  Pulmonary embolism suspected EXAM: CT ANGIOGRAPHY CHEST WITH CONTRAST TECHNIQUE: Multidetector CT imaging of the chest was performed using the standard protocol during bolus administration of intravenous contrast. Multiplanar CT image reconstructions and MIPs were obtained to evaluate the vascular anatomy. CONTRAST:  37mL OMNIPAQUE IOHEXOL 350 MG/ML SOLN COMPARISON:  07/08/2020 FINDINGS: Cardiovascular: Satisfactory opacification of the pulmonary arteries to the segmental level. No evidence of pulmonary embolism. Normal heart size. No pericardial effusion. Extensive coronary atherosclerosis. CABG. Mediastinum/Nodes: No adenopathy or hematoma Lungs/Pleura: There is no edema, consolidation, effusion, or pneumothorax. Upper Abdomen: Low-density left adrenal thickening which is generalized in long-standing/benign. Musculoskeletal: No acute finding.  Multilevel disc space narrowing. Review of the MIP images confirms the above  findings. IMPRESSION: Negative for pulmonary embolism or other acute finding. Atherosclerosis including the coronary arteries.  CABG. Electronically Signed   By: Jorje Guild M.D.   On: 05/05/2021 05:37   CARDIAC CATHETERIZATION  Result Date: 05/05/2021   -----------------NATIVE VESSELS----------------   Ost LAD to Prox LAD lesion is 90% stenosed.   1st Diag lesion is 100% stenosed.   1st Mrg lesion is 90% stenosed. (OM1)   Lat 1st Mrg lesion is 60% stenosed. (OM2) - gives collateral (via residual Sequential SVG limb to OM1)   Mid RCA to Dist RCA lesion is 100% stenosed.   -----------------GRAFTS----------------   LIMA-LAD graft was visualized by angiography and is normal in caliber. Insertion lesion is 40% stenosed &  Dist LAD lesion is 40% stenosed at the anastomosis   Seq SVG- OM1-OM2 graft was not visualized due to known occlusion. Origin to  Prox Graft lesion before 1st Mrg  is 100% stenosed. ->  Sequential limb from OM1 to OM 2 is patent with retrograde flow from OM2-OM1   FreeRIMA-1st Diag graft was visualized by angiography and is small.  Insertion lesion is 50% stenosed - otherwise, The graft exhibits no disease.   Left Radial Artery Graft-rPDA, was visualized by angiography.  The graft exhibits no disease.   -----------------HEOMDYNAMICS----------------   LV end diastolic pressure is normal. POST-OPERATIVE DIAGNOSIS:  Severe native CAD: Subtotal CTO of mid RCA 90% ostial LAD; the LAD actually does course all the way to the apex with the main branch not grafted being a large septal perforator branch. Codominant LCx with bifurcating OM1 (recorded is OM1 OM 2) that are both small in caliber with 90% OM1 and 60% OM2 stenoses.  Retrograde filling of distal OM1 via the remaining OM1-OM2 vein graft is still present.  The remainder the LCx runs into the AV groove giving rise to 2 small caliber posterolateral branches with mild diffuse disease. 3.5 of 5 grafts patent Culprit lesion: 100% flush occlusion of  SeqSVG-OM1-OM2 with patent OM1-OM2 limb Widely patent LIMA-distal LAD with 40% anastomotic lesion Widely patent free RIMA-Diag with 50% anastomotic lesion Widely patent Left Radial-RPDA with 60% mid-distal PDA disease.  Retrograde filling to small caliber posterolateral system and occluded distal RCA Normal LVEDP Heavily calcified right common femoral artery just proximal to bifurcation.  Estimate potential 50% stenosis.  The right SFA is 100% occluded just after the bifurcation with patent profunda artery that has collateralization. => Recommend PV consult   PLAN OF CARE:  Return to nursing unit for ongoing care.  Does not necessarily require further IV heparin.  Medical management with guideline directed medical therapy. With ACS presentation, I did load with Plavix 300 mg and will start 75 mg daily Plavix (okay to interrupt) for 1 year. Images reviewed with Dr. Claiborne Billings.  PATIENT DISPOSITION:  PACU - hemodynamically stable.   Disposition   Pt is being discharged home today in good condition.  Follow-up Plans & Appointments     Follow-up Information     Loel Dubonnet, NP Follow up on 05/22/2021.   Specialty: Cardiology Why: at 8:25am for your follow up appt with Dr. Rosezella Florida NP Donnetta Hail information: [place] [address] [phone]                Discharge Instructions     Amb Referral to Cardiac Rehabilitation   Complete by: As directed    Diagnosis: NSTEMI   After initial evaluation and assessments completed: Virtual Based Care may be provided alone or in conjunction with Phase 2 Cardiac Rehab based on patient barriers.: Yes   Call MD for:  difficulty breathing, headache or visual disturbances   Complete by: As directed    Call MD for:  persistant dizziness or light-headedness   Complete by: As directed    Call MD for:  redness, tenderness, or signs of infection (pain, swelling, redness, odor or green/yellow discharge around incision site)    Complete by: As directed    Diet - low sodium heart healthy   Complete by: As directed    Discharge instructions   Complete by: As directed    Groin Site Care Refer to this sheet in the next few weeks. These instructions provide you with information on caring for yourself after your procedure. Your caregiver may also give you more specific instructions. Your treatment has been planned according to current medical practices, but  problems sometimes occur. Call your caregiver if you have any problems or questions after your procedure. HOME CARE INSTRUCTIONS You may shower 24 hours after the procedure. Remove the bandage (dressing) and gently wash the site with plain soap and water. Gently pat the site dry.  Do not apply powder or lotion to the site.  Do not sit in a bathtub, swimming pool, or whirlpool for 5 to 7 days.  No bending, squatting, or lifting anything over 10 pounds (4.5 kg) as directed by your caregiver.  Inspect the site at least twice daily.  Do not drive home if you are discharged the same day of the procedure. Have someone else drive you.  You may drive 24 hours after the procedure unless otherwise instructed by your caregiver.  What to expect: Any bruising will usually fade within 1 to 2 weeks.  Blood that collects in the tissue (hematoma) may be painful to the touch. It should usually decrease in size and tenderness within 1 to 2 weeks.  SEEK IMMEDIATE MEDICAL CARE IF: You have unusual pain at the groin site or down the affected leg.  You have redness, warmth, swelling, or pain at the groin site.  You have drainage (other than a small amount of blood on the dressing).  You have chills.  You have a fever or persistent symptoms for more than 72 hours.  You have a fever and your symptoms suddenly get worse.  Your leg becomes pale, cool, tingly, or numb.  You have heavy bleeding from the site. Hold pressure on the site. Marland Kitchen  PLEASE DO NOT MISS ANY DOSES OF YOUR PLAVIX!!!!! Also  keep a log of you blood pressures and bring back to your follow up appt. Please call the office with any questions.   Patients taking blood thinners should generally stay away from medicines like ibuprofen, Advil, Motrin, naproxen, and Aleve due to risk of stomach bleeding. You may take Tylenol as directed or talk to your primary doctor about alternatives.  PLEASE ENSURE THAT YOU DO NOT RUN OUT OF YOUR PLAVIX. This medication is very important to remain on for at least one year. IF you have issues obtaining this medication due to cost please CALL the office 3-5 business days prior to running out in order to prevent missing doses of this medication.   Increase activity slowly   Complete by: As directed        Discharge Medications   Allergies as of 05/07/2021       Reactions   Metformin And Related Nausea And Vomiting        Medication List     STOP taking these medications    simvastatin 80 MG tablet Commonly known as: ZOCOR Replaced by: atorvastatin 80 MG tablet       TAKE these medications    Accu-Chek Softclix Lancets lancets USE UP TO FOUR TIMES DAILY AS DIRECTED   ALPRAZolam 0.5 MG tablet Commonly known as: XANAX TAKE 2 TABLETS BY MOUTH TWICE A DAY AS NEEDED FOR ANXIETY What changed: See the new instructions.   aspirin 81 MG EC tablet Take 1 tablet (81 mg total) by mouth daily. Swallow whole.   atorvastatin 80 MG tablet Commonly known as: LIPITOR Take 1 tablet (80 mg total) by mouth daily. Replaces: simvastatin 80 MG tablet   Basaglar KwikPen 100 UNIT/ML Inject 12 Units into the skin daily.   BD Pen Needle Nano 2nd Gen 32G X 4 MM Misc Generic drug: Insulin Pen Needle USE AS DIRECTED  blood glucose meter kit and supplies Kit Dispense based on patient and insurance preference. Use up to four times daily as directed. (FOR ICD-9 250.00, 250.01).   busPIRone 30 MG tablet Commonly known as: BUSPAR TAKE 1 TABLET BY MOUTH TWICE A DAY What changed: when to  take this   clopidogrel 75 MG tablet Commonly known as: PLAVIX Take 1 tablet (75 mg total) by mouth daily with breakfast.   divalproex 500 MG 24 hr tablet Commonly known as: DEPAKOTE ER Take 3 tablets (1,500 mg total) by mouth at bedtime.   Farxiga 10 MG Tabs tablet Generic drug: dapagliflozin propanediol Take 1 tablet (10 mg total) by mouth daily before breakfast.   Januvia 50 MG tablet Generic drug: sitaGLIPtin Take 50 mg by mouth daily.   lisinopril 10 MG tablet Commonly known as: ZESTRIL Take 1 tablet (10 mg total) by mouth daily.   metoprolol succinate 25 MG 24 hr tablet Commonly known as: TOPROL-XL Take 1.5 tablets (37.5 mg total) by mouth daily. Start taking on: May 08, 2021 What changed: how much to take   nitroGLYCERIN 0.4 MG SL tablet Commonly known as: NITROSTAT Place 1 tablet (0.4 mg total) under the tongue every 5 (five) minutes as needed for chest pain.   OLANZapine 20 MG tablet Commonly known as: ZYPREXA Take 1 tablet (20 mg total) by mouth at bedtime.   oxyCODONE 5 MG immediate release tablet Commonly known as: Oxy IR/ROXICODONE Take 1 tablet (5 mg total) by mouth every 6 (six) hours as needed for severe pain.   PARoxetine 20 MG tablet Commonly known as: PAXIL TAKE 2 TABLETS BY MOUTH EVERY DAY   VISINE DRY EYE OP Place 1 drop into both eyes 3 (three) times daily as needed (dry eyes).   Xiidra 5 % Soln Generic drug: Lifitegrast Place 1 drop into both eyes in the morning and at bedtime.         Outstanding Labs/Studies   FLP/LFTs in 8 weeks  Duration of Discharge Encounter   Greater than 30 minutes including physician time.  Signed, Reino Bellis, NP 05/07/2021, 10:19 AM    Patient seen and examined. Agree with assessment and plan.  No recurrent chest pain.  Patient has been ambulating in the hall.  She is not well beta blocked with resting heart rates in the 90s.  We will titrate with metoprolol succinate to 37.5 mg, lisinopril  and Farxiga were added.  She is now on atorvastatin 80 mg in place of simvastatin 80 mg.  Okay for discharge today.  I had long discussion regarding the importance of complete smoking cessation.   Troy Sine, MD, [facility] 05/07/2021 10:19 AM

## 2021-05-07 NOTE — Telephone Encounter (Signed)
Hello,  The Pharmacy team is conducting a discharge transitions of care quality improvement initiative. The recommendations below are for your consideration.    Bailey Greene is a 60 y.o. female (MRN: [id number], DOB: Oct 03, 1960) who was recently hospitalized on 05/05/2021 for NSTEMI. They are anticipated to visit your clinic for post-discharge follow-up and may benefit from assistance with medication initiation and/or access.     Relevant medication issues from their recent admission include: -She has tried metformin in the past but was intolerant -Wilma Flavin was added (has a $0 copay)    We appreciate your assistance with the implementation of these recommendations. Please let us know if there is anything we can help you with at this time.       Thank you,   Harland German, PharmD Clinical Pharmacist **Pharmacist phone directory can now be found on [email or web address] ([email or web address]).  Listed under [facility].

## 2021-05-08 ENCOUNTER — Telehealth (HOSPITAL_COMMUNITY): Payer: Self-pay

## 2021-05-08 NOTE — Telephone Encounter (Signed)
Will check insurance benefits closer to scheduling and/or into the new year 2023. 

## 2021-05-08 NOTE — Telephone Encounter (Signed)
Called patient to see if she is interested in the Cardiac Rehab Program. Patient expressed interest. Explained scheduling process and went over insurance, patient verbalized understanding. Will contact patient for scheduling once f/u has been completed. 

## 2021-05-12 ENCOUNTER — Ambulatory Visit (INDEPENDENT_AMBULATORY_CARE_PROVIDER_SITE_OTHER): Payer: 59 | Admitting: Family

## 2021-05-12 ENCOUNTER — Encounter: Payer: Self-pay | Admitting: Family

## 2021-05-12 VITALS — BP 105/60 | HR 76 | Temp 98.3°F | Resp 16 | Ht 65.0 in | Wt 142.4 lb

## 2021-05-12 DIAGNOSIS — I10 Essential (primary) hypertension: Secondary | ICD-10-CM | POA: Diagnosis not present

## 2021-05-12 DIAGNOSIS — E785 Hyperlipidemia, unspecified: Secondary | ICD-10-CM

## 2021-05-12 DIAGNOSIS — Z720 Tobacco use: Secondary | ICD-10-CM

## 2021-05-12 DIAGNOSIS — E119 Type 2 diabetes mellitus without complications: Secondary | ICD-10-CM | POA: Diagnosis not present

## 2021-05-12 DIAGNOSIS — F319 Bipolar disorder, unspecified: Secondary | ICD-10-CM

## 2021-05-12 DIAGNOSIS — I739 Peripheral vascular disease, unspecified: Secondary | ICD-10-CM | POA: Insufficient documentation

## 2021-05-12 DIAGNOSIS — I214 Non-ST elevation (NSTEMI) myocardial infarction: Secondary | ICD-10-CM

## 2021-05-12 NOTE — Assessment & Plan Note (Signed)
>>  ASSESSMENT AND PLAN FOR ESSENTIAL HYPERTENSION WRITTEN ON 05/12/2021  4:38 PM BY O'SULLIVAN, Fausto Sampedro, NP  BP Readings from Last 3 Encounters:  05/12/21 105/60  05/07/21 131/76  02/10/21 127/78   BP stable with addition of lisinopril  and increase in metoprolol . Obtain follow up bmet.

## 2021-05-12 NOTE — Assessment & Plan Note (Signed)
Followed by psychiatry- no recent med changes at visit.

## 2021-05-12 NOTE — Assessment & Plan Note (Signed)
She is down to 1-2 cigarettes a day. Urged her to quit smoking.

## 2021-05-12 NOTE — Assessment & Plan Note (Signed)
Incidental finding on cardiac cath. Will refer to Vascular Surgeon.

## 2021-05-12 NOTE — Progress Notes (Signed)
Subjective:   By signing my name below, I, Bailey Greene, attest that this documentation has been prepared under the direction and in the presence of [name], NP, 05/12/2021   Patient ID: Bailey Greene, female    DOB: 28-Mar-1961, 60 y.o.   MRN: [id number]  Chief Complaint  Patient presents with   Diabetes    Here for 3 Month Follow Up    HPI Patient is in today for an office visit.  NSTEMI: She was admitted to the ED because of chest pain. It was diagnosed as a NSTEMI. She underwent cardiac cath and medical management was recommended. She was prescribed 10 mg lisinopril, 75 mg Plavix and her metoprolol dose was increased.  She reports that she is feeling fatigued and left arm numbness and tingling since she has returned home. She has a follow up with cardiology on 05/22/2021 Blood pressure: She is compliant in taking 10 mg lisinopril to help control her blood pressure. Her blood pressure is within good range today. BP Readings from Last 3 Encounters:  05/12/21 105/60  05/07/21 131/76  02/10/21 127/78  Tobacco: She has decreased her tobacco use since her heart attack last week and now only smokes 1-2 cigarettes a day. Anxiety: She notes that her anxiety has increased and she is feeling overwhelmed with everything specifically in the last week. She is currently seeing a psychiatrist to work this out.  Immunizations: She has had four Covid-19 immunizations at this time.   Cardiac Cath results as follows:  POST-OPERATIVE DIAGNOSIS:   Severe native CAD: Subtotal CTO of mid RCA 90% ostial LAD; the LAD actually does course all the way to the apex with the main branch not grafted being a large septal perforator branch. Codominant LCx with bifurcating OM1 (recorded is OM1 OM 2) that are both small in caliber with 90% OM1 and 60% OM2 stenoses.  Retrograde filling of distal OM1 via the remaining OM1-OM2 vein graft is still present.  The remainder the LCx runs into the AV  groove giving rise to 2 small caliber posterolateral branches with mild diffuse disease. 3.5 of 5 grafts patent Culprit lesion: 100% flush occlusion of SeqSVG-OM1-OM2 with patent OM1-OM2 limb Widely patent LIMA-distal LAD with 40% anastomotic lesion Widely patent free RIMA-Diag with 50% anastomotic lesion Widely patent Left Radial-RPDA with 60% mid-distal PDA disease.  Retrograde filling to small caliber posterolateral system and occluded distal RCA Normal LVEDP Heavily calcified right common femoral artery just proximal to bifurcation.  Estimate potential 50% stenosis.  The right SFA is 100% occluded just after the bifurcation with patent profunda artery that has collateralization. => Recommend PV consult  Past Medical History:  Diagnosis Date   Anxiety    Bipolar affective disorder ([place])    CAD (coronary artery disease) 2006   CABG w/ LIMA-LAD, RIMA-Diag, SVG-OM1-OM2, R radial-PDA   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   Qualifier: Diagnosis of  By: Bailey Greene     Diabetes mellitus type 2 in nonobese ([place]) 07/2016   Dyslipidemia    Headache(784.0)    History of pulmonary embolism 07/08/2020   HTN (hypertension)    Migraine    Pulmonary embolus ([place])    unprovoked    Past Surgical History:  Procedure Laterality Date   BUNIONECTOMY  08/2011   right foot   CARDIAC CATHETERIZATION  2007   severe native 3 v dz, all grafts patent (LIMA-LAD, RIMA-Diag, SVG-OM1-OM2, R radial-PDA)   [place] GRAFT  2006  Coronary artery bypass grafting x5 with a left  internal  mammary to the left anterior descending coronary artery.  Free right  internal mammary to the diagonal coronary artery, sequential reverse  saphenous vein graft to the first and second obtuse marginal, right  artery bypass to the posterior descending coronary artery with endo-vein harvesting.   LEFT HEART CATH AND CORS/GRAFTS ANGIOGRAPHY N/A 05/05/2021   Procedure: LEFT HEART CATH AND CORS/GRAFTS  ANGIOGRAPHY;  Surgeon: Leonie Man, MD;  Location: [facility];  Service: Cardiovascular;  Laterality: N/A;   UMBILICAL HERNIA REPAIR N/A 01/16/2021   Procedure: UMBILICAL  HERNIA REPAIR;  Surgeon: Dwan Bolt, MD;  Location: [facility];  Service: General;  Laterality: N/A;    Family History  Problem Relation Age of Onset   Lupus Mother    Heart attack Father    Diabetes Father    Hypertension Father    Diabetes Paternal Grandmother    Hypertension Paternal Grandmother    Stroke Neg Hx    Kidney disease Neg Hx    Hyperlipidemia Neg Hx    Sudden death Neg Hx     Social History   Socioeconomic History   Marital status: Legally Separated    Spouse name: Not on file   Number of children: Not on file   Years of education: Not on file   Highest education level: Not on file  Occupational History   Occupation: mortgage loan specialist  Tobacco Use   Smoking status: Some Days    Packs/day: 0.50    Years: 25.00    Pack years: 12.50    Types: Cigarettes   Smokeless tobacco: Never  Vaping Use   Vaping Use: Never used  Substance and Sexual Activity   Alcohol use: No    Alcohol/week: 0.0 standard drinks   Drug use: Never   Sexual activity: Yes    Partners: Male    Comment: married  Other Topics Concern   Not on file  Social History Narrative   Regular exercise:  3 days weekly   Caffeine Use:  1 soda daily   One child biological daughter born in 59 and an adopted niece.   Bailey Greene specialist   Married- may be divorcing.          Social Determinants of Health   Financial Resource Strain: Not on file  Food Insecurity: Not on file  Transportation Needs: Not on file  Physical Activity: Not on file  Stress: Not on file  Social Connections: Not on file  Intimate Partner Violence: Not on file    Outpatient Medications Prior to Visit  Medication Sig Dispense Refill   Accu-Chek Softclix Lancets lancets USE UP TO FOUR TIMES DAILY AS DIRECTED  200 each 12   ALPRAZolam (XANAX) 0.5 MG tablet TAKE 2 TABLETS BY MOUTH TWICE A DAY AS NEEDED FOR ANXIETY (Patient taking differently: Take 1 mg by mouth 2 (two) times daily as needed for anxiety.) 60 tablet 1   aspirin 81 MG EC tablet Take 1 tablet (81 mg total) by mouth daily. Swallow whole. 30 tablet 12   atorvastatin (LIPITOR) 80 MG tablet Take 1 tablet (80 mg total) by mouth daily. 90 tablet 3   blood glucose meter kit and supplies KIT Dispense based on patient and insurance preference. Use up to four times daily as directed. (FOR ICD-9 250.00, 250.01). 1 each 0   busPIRone (BUSPAR) 30 MG tablet TAKE 1 TABLET BY MOUTH TWICE A DAY (Patient taking  differently: Take 30 mg by mouth in the morning and at bedtime.) 60 tablet 0   clopidogrel (PLAVIX) 75 MG tablet Take 1 tablet (75 mg total) by mouth daily with breakfast. 90 tablet 3   dapagliflozin propanediol (FARXIGA) 10 MG TABS tablet Take 1 tablet (10 mg total) by mouth daily before breakfast. 30 tablet 11   divalproex (DEPAKOTE ER) 500 MG 24 hr tablet Take 3 tablets (1,500 mg total) by mouth at bedtime. 270 tablet 0   Glycerin-Hypromellose-PEG 400 (VISINE DRY EYE OP) Place 1 drop into both eyes 3 (three) times daily as needed (dry eyes).     Insulin Glargine (BASAGLAR KWIKPEN) 100 UNIT/ML Inject 12 Units into the skin daily. 9 mL 3   Insulin Pen Needle (BD PEN NEEDLE NANO 2ND GEN) 32G X 4 MM MISC USE AS DIRECTED 100 each 3   JANUVIA 50 MG tablet Take 50 mg by mouth daily.     lisinopril (ZESTRIL) 10 MG tablet Take 1 tablet (10 mg total) by mouth daily. 30 tablet 11   metoprolol succinate (TOPROL-XL) 25 MG 24 hr tablet Take 1.5 tablets (37.5 mg total) by mouth daily. 30 tablet 11   nitroGLYCERIN (NITROSTAT) 0.4 MG SL tablet Place 1 tablet (0.4 mg total) under the tongue every 5 (five) minutes as needed for chest pain. 25 tablet 2   OLANZapine (ZYPREXA) 20 MG tablet Take 1 tablet (20 mg total) by mouth at bedtime. 30 tablet 0   oxyCODONE (OXY  IR/ROXICODONE) 5 MG immediate release tablet Take 1 tablet (5 mg total) by mouth every 6 (six) hours as needed for severe pain. (Patient not taking: Reported on 02/18/2021) 15 tablet 0   PARoxetine (PAXIL) 20 MG tablet TAKE 2 TABLETS BY MOUTH EVERY DAY (Patient taking differently: Take 40 mg by mouth daily.) 180 tablet 1   XIIDRA 5 % SOLN Place 1 drop into both eyes in the morning and at bedtime.     No facility-administered medications prior to visit.    Allergies  Allergen Reactions   Metformin And Related Nausea And Vomiting    ROS See HPI    Objective:    Physical Exam Constitutional:      General: She is not in acute distress.    Appearance: Normal appearance. She is not ill-appearing.  HENT:     Head: Normocephalic and atraumatic.     Right Ear: External ear normal.     Left Ear: External ear normal.  Eyes:     Extraocular Movements: Extraocular movements intact.     Pupils: Pupils are equal, round, and reactive to light.  Cardiovascular:     Rate and Rhythm: Normal rate and regular rhythm.     Heart sounds: Normal heart sounds. No murmur heard.   No gallop.  Pulmonary:     Effort: Pulmonary effort is normal. No respiratory distress.     Breath sounds: Normal breath sounds. No wheezing or rales.  Lymphadenopathy:     Cervical: No cervical adenopathy.  Skin:    General: Skin is warm and dry.  Neurological:     Mental Status: She is alert and oriented to person, place, and time.  Psychiatric:        Behavior: Behavior normal.        Judgment: Judgment normal.    BP 105/60 (BP Location: Right Arm, Patient Position: Sitting, Cuff Size: Small)    Pulse 76    Temp 98.3 F (36.8 C) (Oral)    Resp 16  Ht '5\' 5"'$  (1.651 m)    Wt 142 lb 6.4 oz (64.6 kg)    LMP 11/28/2009    SpO2 100%    BMI 23.70 kg/m  Wt Readings from Last 3 Encounters:  05/12/21 142 lb 6.4 oz (64.6 kg)  05/05/21 144 lb (65.3 kg)  02/10/21 143 lb (64.9 kg)       Assessment & Plan:   Problem List  Items Addressed This Visit       Unprioritized   Tobacco abuse    She is down to 1-2 cigarettes a day. Urged her to quit smoking.       PVD (peripheral vascular disease) ([place]) - Primary    Incidental finding on cardiac cath. Will refer to Vascular Surgeon.      Relevant Orders   Ambulatory referral to Vascular Surgery   NSTEMI (non-ST elevated myocardial infarction) ([place])    Clinically stable with medical management which is being prescribed by cardiology. She is advised to keep her upcoming visit with cardiology.       Hyperlipidemia with target LDL less than 70    Statin was changed during her hospitalization from simvastatin to atorvastatin.       Essential hypertension    BP Readings from Last 3 Encounters:  05/12/21 105/60  05/07/21 131/76  02/10/21 127/78  BP stable with addition of lisinopril and increase in metoprolol. Obtain follow up bmet.       Relevant Orders   Basic metabolic panel   Diabetes mellitus type 2 in nonobese [facility])    Lab Results  Component Value Date   HGBA1C 6.6 (H) 05/06/2021   HGBA1C 6.7 (H) 05/05/2021   HGBA1C 6.8 (H) 01/07/2021   Lab Results  Component Value Date   MICROALBUR 0.4 07/18/2020   LDLCALC 65 05/06/2021   CREATININE 0.77 05/06/2021  A1C at goal. No change in treatment at this time.      Bipolar affective disorder ([place])    Followed by psychiatry- no recent med changes at visit.       No orders of the defined types were placed in this encounter.   I, [name], NP, personally preformed the services described in this documentation.  All medical record entries made by the scribe were at my direction and in my presence.  I have reviewed the chart and discharge instructions (if applicable) and agree that the record reflects my personal performance and is accurate and complete. 05/12/2021  I,Bailey Greene,acting as a Education administrator for Nance Pear, NP.,have documented all relevant documentation on the behalf of  Nance Pear, NP,as directed by  Nance Pear, NP while in the presence of Nance Pear, NP.  Nance Pear, NP

## 2021-05-12 NOTE — Assessment & Plan Note (Signed)
Clinically stable with medical management which is being prescribed by cardiology. She is advised to keep her upcoming visit with cardiology.

## 2021-05-12 NOTE — Assessment & Plan Note (Addendum)
Lab Results  Component Value Date   HGBA1C 6.6 (H) 05/06/2021   HGBA1C 6.7 (H) 05/05/2021   HGBA1C 6.8 (H) 01/07/2021   Lab Results  Component Value Date   MICROALBUR 0.4 07/18/2020   LDLCALC 65 05/06/2021   CREATININE 0.77 05/06/2021   A1C at goal. No change in treatment at this time.

## 2021-05-12 NOTE — Assessment & Plan Note (Signed)
Statin was changed during her hospitalization from simvastatin to atorvastatin.

## 2021-05-12 NOTE — Patient Instructions (Signed)
Please visit the lab before leaving today.  Continue taking medications as prescribed.  Continue working with cardiology and psychiatry.

## 2021-05-12 NOTE — Assessment & Plan Note (Addendum)
BP Readings from Last 3 Encounters:  05/12/21 105/60  05/07/21 131/76  02/10/21 127/78   BP stable with addition of lisinopril and increase in metoprolol. Obtain follow up bmet.

## 2021-05-13 LAB — BASIC METABOLIC PANEL
BUN: 19 mg/dL (ref 6–23)
CO2: 28 mEq/L (ref 19–32)
Calcium: 9.5 mg/dL (ref 8.4–10.5)
Chloride: 103 mEq/L (ref 96–112)
Creatinine, Ser: 1.19 mg/dL (ref 0.40–1.20)
GFR: 50.42 mL/min — ABNORMAL LOW (ref >60.00)
Glucose, Bld: 107 mg/dL — ABNORMAL HIGH (ref 70–99)
Potassium: 4.2 mEq/L (ref 3.5–5.1)
Sodium: 138 mEq/L (ref 135–145)

## 2021-05-14 ENCOUNTER — Telehealth: Payer: Self-pay | Admitting: *Deleted

## 2021-05-14 NOTE — Telephone Encounter (Signed)
Patient contacted regarding discharge from [facility] on 05/07/21.  Patient understands to follow up with provider Walker on 05/22/21 at 8:25 am at [facility]. Patient understands discharge instructions? yes  Patient understands medications and regiment? yes  Patient understands to bring all medications to this visit? yes

## 2021-05-14 NOTE — Telephone Encounter (Signed)
Appointment With Cardiology Alver Sorrow, NP) 05/22/2021 at 8:25 AM

## 2021-05-14 NOTE — Telephone Encounter (Signed)
-----   Message from Arty Baumgartner, NP sent at 05/07/2021 10:16 AM EST ----- Regarding: TOC call Needs TOC call please, thx!

## 2021-05-15 ENCOUNTER — Other Ambulatory Visit: Payer: Self-pay | Admitting: Psychiatry

## 2021-05-15 DIAGNOSIS — F4001 Agoraphobia with panic disorder: Secondary | ICD-10-CM

## 2021-05-18 ENCOUNTER — Telehealth: Payer: Self-pay | Admitting: Cardiology

## 2021-05-18 NOTE — Telephone Encounter (Signed)
Received stat call from patient with complaints of chest pain. Patient states that she has been having intermittent chest pain for the last 2 days. Patient reports that she last had this pain today while out grocery shopping. Patient states that the pain comes and goes and is in the center of her chest and radiates to down her left arm. Patient states that she took two nitros on Saturday with relief. Patient would like to know if this is normal. Patient denies any symptoms at this time. Advised patient that if the pain returns she should report back to the ER for evaluation. Advised patient to keep post hospital follow up as scheduled for 12/23, and that I would forward message to Dr. Antoine Poche for him to review and advise. Patient verbalized understanding.

## 2021-05-18 NOTE — Telephone Encounter (Signed)
Pt c/o of Chest Pain: STAT if CP now or developed within 24 hours  1. Are you having CP right now? She stated she is not having chest pain right now but she is having tightness and pain in her left arm.    2. Are you experiencing any other symptoms (ex. SOB, nausea, vomiting, sweating)? Patient denies any of these symptoms   3. How long have you been experiencing CP? She stated it started about 2 days ago   4. Is your CP continuous or coming and going? Coming and going   5. Have you taken Nitroglycerin? She has taken 2  ?

## 2021-05-19 ENCOUNTER — Telehealth: Payer: Self-pay | Admitting: Cardiology

## 2021-05-19 MED ORDER — ISOSORBIDE MONONITRATE ER 60 MG PO TB24: 60.0000 mg | ORAL_TABLET | Freq: Every day | ORAL | 3 refills | Status: DC

## 2021-05-19 NOTE — Telephone Encounter (Signed)
Spoke with patient and relayed Dr. Jenene Slicker recommendations for med change and advice. Rx sent to pharmacy

## 2021-05-19 NOTE — Telephone Encounter (Signed)
12/20 Disability forms from [employer] were put in Dr.Hochrein box.

## 2021-05-19 NOTE — Telephone Encounter (Signed)
Marieme is returning Lisa's call.

## 2021-05-19 NOTE — Telephone Encounter (Signed)
Left a message for the patient to call back.   Rollene Rotunda, MD  [place], Cleotilde Neer, RN 14 hours ago (6:07 PM)   I don't see that she is on Imdur.  I would like to add 60 mg PO daily.  I agree with the message.  I did review the cath done earlier this month.  If her symptoms are worse she needs to go back to the ED.  Unfortunately, there was not a target vessel to fix during her recent cath.   Dr. Antoine Poche

## 2021-05-21 NOTE — Telephone Encounter (Signed)
12/22 forms faxed to [employer]

## 2021-05-22 ENCOUNTER — Encounter: Payer: Self-pay | Admitting: Family

## 2021-05-22 ENCOUNTER — Ambulatory Visit (INDEPENDENT_AMBULATORY_CARE_PROVIDER_SITE_OTHER): Payer: 59 | Admitting: Family

## 2021-05-22 ENCOUNTER — Other Ambulatory Visit (HOSPITAL_COMMUNITY): Payer: Self-pay

## 2021-05-22 ENCOUNTER — Telehealth (HOSPITAL_COMMUNITY): Payer: Self-pay

## 2021-05-22 ENCOUNTER — Encounter (HOSPITAL_BASED_OUTPATIENT_CLINIC_OR_DEPARTMENT_OTHER): Payer: Self-pay | Admitting: Family

## 2021-05-22 ENCOUNTER — Other Ambulatory Visit: Payer: Self-pay

## 2021-05-22 VITALS — BP 110/60 | HR 74 | Ht 66.0 in | Wt 141.0 lb

## 2021-05-22 DIAGNOSIS — E118 Type 2 diabetes mellitus with unspecified complications: Secondary | ICD-10-CM

## 2021-05-22 DIAGNOSIS — Z951 Presence of aortocoronary bypass graft: Secondary | ICD-10-CM

## 2021-05-22 DIAGNOSIS — E785 Hyperlipidemia, unspecified: Secondary | ICD-10-CM

## 2021-05-22 DIAGNOSIS — I10 Essential (primary) hypertension: Secondary | ICD-10-CM

## 2021-05-22 DIAGNOSIS — I25118 Atherosclerotic heart disease of native coronary artery with other forms of angina pectoris: Secondary | ICD-10-CM

## 2021-05-22 NOTE — Progress Notes (Signed)
Office Visit    Patient Name: Bailey Greene Date of Encounter: 05/22/2021  PCP:  [name], NP   [place]  Cardiologist:  Minus Breeding, MD  Advanced Practice Provider:  Warren Lacy, PA-C Electrophysiologist:  None      Chief Complaint    Bailey Greene is a 60 y.o. female with a hx of CAD s/p CABG, hypertension, hyperlipidemia, diabetes mellitus, bipolar disorder, pulmonary embolism (completed 6 months of anticoagulation) and anxiety  presents today for hospital follow up   Past Medical History    Past Medical History:  Diagnosis Date   Anxiety    Bipolar affective disorder ([place])    CAD (coronary artery disease) 2006   CABG w/ LIMA-LAD, RIMA-Diag, SVG-OM1-OM2, R radial-PDA   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   Qualifier: Diagnosis of  By: Garen Grams     Diabetes mellitus type 2 in nonobese ([place]) 07/2016   Dyslipidemia    Headache(784.0)    History of pulmonary embolism 07/08/2020   HTN (hypertension)    Migraine    Pulmonary embolus ([place])    unprovoked   Past Surgical History:  Procedure Laterality Date   BUNIONECTOMY  08/2011   right foot   CARDIAC CATHETERIZATION  2007   severe native 3 v dz, all grafts patent (LIMA-LAD, RIMA-Diag, SVG-OM1-OM2, R radial-PDA)   [place]   CORONARY ARTERY BYPASS GRAFT  2006    Coronary artery bypass grafting x5 with a left  internal  mammary to the left anterior descending coronary artery.  Free right  internal mammary to the diagonal coronary artery, sequential reverse  saphenous vein graft to the first and second obtuse marginal, right  artery bypass to the posterior descending coronary artery with endo-vein harvesting.   LEFT HEART CATH AND CORS/GRAFTS ANGIOGRAPHY N/A 05/05/2021   Procedure: LEFT HEART CATH AND CORS/GRAFTS ANGIOGRAPHY;  Surgeon: Leonie Man, MD;  Location: [facility];  Service: Cardiovascular;  Laterality: N/A;   UMBILICAL HERNIA REPAIR  N/A 01/16/2021   Procedure: UMBILICAL  HERNIA REPAIR;  Surgeon: Dwan Bolt, MD;  Location: [facility];  Service: General;  Laterality: N/A;    Allergies  Allergies  Allergen Reactions   Metformin And Related Nausea And Vomiting    History of Present Illness    Bailey Greene is a 60 y.o. female with a hx of CAD s/p CABG, hypertension, hyperlipidemia, diabetes mellitus, bipolar disorder, pulmonary embolism (completed 6 months of anticoagulation) and anxiety  last seen while hospitalized.  She had previous CABG X5 LIMA-LAD, RIMA-diagonal sequential SVG-OM1-OM2, right radial-PDA in 2006.  Cardiac cath in 2008 showed patent grafts.  Longstanding history of sharp chest pain in the setting of emotional stress.  She had Myoview 07/2017 which was low risk with no ischemia.  She was diagnosed and treated February 2022 for PE with 6 months of Eliquis.  She had echocardiogram 08/14/2020 with LVEF 60 to 65%, no R WMA.  Monitor 07/2020 with normal sinus rhythm and 1 run of nonsustained VT for 11 beats.    Most recently admitted 05/05/2021 with NSTEMI with troponin peaking at 3359.  Cardiac catheterization showed severe multivessel native CAD, 3 out of 5 grafts patent (LIMA-LAD, free RIMA-diagonal, left radial-RPDA) culprit lesion felt to be 100% occluded SVG-OM-OM2 with patent OM1-OM2 limb.  She was recommended for medical therapy.  She was loaded with Plavix and transition to atorvastatin 80 mg daily.  Her metoprolol was increased to 37.5 mg daily.  Lisinopril 10  mg also added given her history of diabetes.  She was additionally started on Farxiga at discharge for diabetes.  She presents today for follow-up. She saw  Right femoral artery was heavily calcified with estimated 50% stenosis and right SFA 100% occluded after bifurcation with patent profunda artery with collateralization. Recommended for PV consult.   She had some chest pain earlier this week   EKGs/Labs/Other Studies Reviewed:   The  following studies were reviewed today:  LHC 05/05/21   -----------------NATIVE VESSELS----------------   Ost LAD to Prox LAD lesion is 90% stenosed.   1st Diag lesion is 100% stenosed.   1st Mrg lesion is 90% stenosed. (OM1)   Lat 1st Mrg lesion is 60% stenosed. (OM2) - gives collateral (via residual Sequential SVG limb to OM1)   Mid RCA to Dist RCA lesion is 100% stenosed.   -----------------GRAFTS----------------   LIMA-LAD graft was visualized by angiography and is normal in caliber. Insertion lesion is 40% stenosed &  Dist LAD lesion is 40% stenosed at the anastomosis   Seq SVG- OM1-OM2 graft was not visualized due to known occlusion. Origin to Prox Graft lesion before 1st Mrg  is 100% stenosed. ->  Sequential limb from OM1 to OM 2 is patent with retrograde flow from OM2-OM1   FreeRIMA-1st Diag graft was visualized by angiography and is small.  Insertion lesion is 50% stenosed - otherwise, The graft exhibits no disease.   Left Radial Artery Graft-rPDA, was visualized by angiography.  The graft exhibits no disease.   -----------------HEOMDYNAMICS----------------   LV end diastolic pressure is normal.   POST-OPERATIVE DIAGNOSIS:   Severe native CAD: Subtotal CTO of mid RCA 90% ostial LAD; the LAD actually does course all the way to the apex with the main branch not grafted being a large septal perforator branch. Codominant LCx with bifurcating OM1 (recorded is OM1 OM 2) that are both small in caliber with 90% OM1 and 60% OM2 stenoses.  Retrograde filling of distal OM1 via the remaining OM1-OM2 vein graft is still present.  The remainder the LCx runs into the AV groove giving rise to 2 small caliber posterolateral branches with mild diffuse disease. 3.5 of 5 grafts patent Culprit lesion: 100% flush occlusion of SeqSVG-OM1-OM2 with patent OM1-OM2 limb Widely patent LIMA-distal LAD with 40% anastomotic lesion Widely patent free RIMA-Diag with 50% anastomotic lesion Widely patent Left  Radial-RPDA with 60% mid-distal PDA disease.  Retrograde filling to small caliber posterolateral system and occluded distal RCA Normal LVEDP Heavily calcified right common femoral artery just proximal to bifurcation.  Estimate potential 50% stenosis.  The right SFA is 100% occluded just after the bifurcation with patent profunda artery that has collateralization. => Recommend PV consult     PLAN OF CARE:   Return to nursing unit for ongoing care.  Does not necessarily require further IV heparin.  Medical management with guideline directed medical therapy. With ACS presentation, I did load with Plavix 300 mg and will start 75 mg daily Plavix (okay to interrupt) for 1 year.   Images reviewed with Dr. Claiborne Billings.   PATIENT DISPOSITION:  PACU - hemodynamically stable. Echo 08/14/20  1. Left ventricular ejection fraction, by estimation, is 60 to 65%. The  left ventricle has normal function. The left ventricle has no regional  wall motion abnormalities. Left ventricular diastolic parameters were  normal. The average left ventricular  global longitudinal strain is -19.5 %.   2. Right ventricular systolic function is normal. The right ventricular  size is normal.  3. The mitral valve is normal in structure. No evidence of mitral valve  regurgitation.   4. The aortic valve is normal in structure. Aortic valve regurgitation is  not visualized. No aortic stenosis is present.   Comparison(s): 07/19/17 EF 55-60%.  Monitor 08/11/20 Patient had a min HR of 60 bpm, max HR of 203 bpm, and avg HR of 88 bpm. Predominant underlying rhythm was Sinus Rhythm. 1 run of Ventricular Tachycardia occurred lasting 6 beats with a max rate of 176 bpm (avg 166 bpm). 1 run of Supraventricular Tachycardia occurred lasting 11 beats with a max rate of 203 bpm (avg 184 bpm). Isolated SVEs were rare (<1.0%), SVE Couplets were rare (<1.0%), and SVE Triplets were rare (<1.0%). Isolated VEs were rare (<1.0%), and no VE Couplets or VE  Triplets were present.   Normal sinus rhythm One run of non sustained ventricular tachycardia with the longest being 11 beats One run of supraventricular tachycardia with the longest lasting six beats.   No sustained arrhythmias.   EKG: No EKG today.  Recent Labs: 07/23/2020: Magnesium 2.0 10/29/2020: ALT 9; TSH 0.79 05/07/2021: Hemoglobin 12.8; Platelets 199 05/12/2021: BUN 19; Creatinine, Ser 1.19; Potassium 4.2; Sodium 138  Recent Lipid Panel    Component Value Date/Time   CHOL 137 05/06/2021 0351   CHOL 139 07/17/2020 1009   TRIG 113 05/06/2021 0351   HDL 49 05/06/2021 0351   HDL 58 07/17/2020 1009   CHOLHDL 2.8 05/06/2021 0351   VLDL 23 05/06/2021 0351   LDLCALC 65 05/06/2021 0351   LDLCALC 65 07/17/2020 1009   LDLDIRECT 145.2 07/18/2008 0821   Home Medications   Current Meds  Medication Sig   Accu-Chek Softclix Lancets lancets USE UP TO FOUR TIMES DAILY AS DIRECTED   ALPRAZolam (XANAX) 0.5 MG tablet TAKE 2 TABLETS BY MOUTH TWICE A DAY AS NEEDED FOR ANXIETY   aspirin 81 MG EC tablet Take 1 tablet (81 mg total) by mouth daily. Swallow whole.   atorvastatin (LIPITOR) 80 MG tablet Take 1 tablet (80 mg total) by mouth daily.   blood glucose meter kit and supplies KIT Dispense based on patient and insurance preference. Use up to four times daily as directed. (FOR ICD-9 250.00, 250.01).   busPIRone (BUSPAR) 30 MG tablet TAKE 1 TABLET BY MOUTH TWICE A DAY   clopidogrel (PLAVIX) 75 MG tablet Take 1 tablet (75 mg total) by mouth daily with breakfast.   dapagliflozin propanediol (FARXIGA) 10 MG TABS tablet Take 1 tablet (10 mg total) by mouth daily before breakfast.   divalproex (DEPAKOTE ER) 500 MG 24 hr tablet Take 3 tablets (1,500 mg total) by mouth at bedtime.   Glycerin-Hypromellose-PEG 400 (VISINE DRY EYE OP) Place 1 drop into both eyes 3 (three) times daily as needed (dry eyes).   Insulin Glargine (BASAGLAR KWIKPEN) 100 UNIT/ML Inject 12 Units into the skin daily.   Insulin  Pen Needle (BD PEN NEEDLE NANO 2ND GEN) 32G X 4 MM MISC USE AS DIRECTED   isosorbide mononitrate (IMDUR) 60 MG 24 hr tablet Take 1 tablet (60 mg total) by mouth daily.   JANUVIA 50 MG tablet Take 50 mg by mouth daily.   lisinopril (ZESTRIL) 10 MG tablet Take 1 tablet (10 mg total) by mouth daily.   metoprolol succinate (TOPROL-XL) 25 MG 24 hr tablet Take 1.5 tablets (37.5 mg total) by mouth daily.   nitroGLYCERIN (NITROSTAT) 0.4 MG SL tablet Place 1 tablet (0.4 mg total) under the tongue every 5 (five) minutes as  needed for chest pain.   OLANZapine (ZYPREXA) 20 MG tablet Take 1 tablet (20 mg total) by mouth at bedtime.   oxyCODONE (OXY IR/ROXICODONE) 5 MG immediate release tablet Take 1 tablet (5 mg total) by mouth every 6 (six) hours as needed for severe pain.   PARoxetine (PAXIL) 20 MG tablet TAKE 2 TABLETS BY MOUTH EVERY DAY   XIIDRA 5 % SOLN Place 1 drop into both eyes in the morning and at bedtime.     Review of Systems      All other systems reviewed and are otherwise negative except as noted above.  Physical Exam    VS:  LMP 11/28/2009  , BMI There is no height or weight on file to calculate BMI.  Wt Readings from Last 3 Encounters:  05/12/21 142 lb 6.4 oz (64.6 kg)  05/05/21 144 lb (65.3 kg)  02/10/21 143 lb (64.9 kg)     GEN: Well nourished, well developed, in no acute distress. HEENT: normal. Neck: Supple, no JVD, carotid bruits, or masses. Cardiac: RRR, no murmurs, rubs, or gallops. No clubbing, cyanosis, edema.  Radials/PT 2+ and equal bilaterally.  Respiratory:  Respirations regular and unlabored, clear to auscultation bilaterally. GI: Soft, nontender, nondistended. MS: No deformity or atrophy. Skin: Warm and dry, no rash. Neuro:  Strength and sensation are intact. Psych: Normal affect.  Assessment & Plan    CAD s/p CABG - Previous CABG x5 in 2006. 05/05/21 NSTEMI with occluded SVG-OM1-OM2 with patent OM1-OM2 limb recommended for medical therapy. Recommended for  Plavix for at least 1 year. Additional GDMT includes Farxiga, Metoprolol, Atorvastatin, Aspirin. One episode of chest pain since discharge which resolved with 1 nitroglycerin. As symptoms are infrequent she prefers to continue current medications. If it increased frequency could consider increasing Metoprolol dose. Heart healthy diet and regular cardiovascular exercise encouraged.  Encouraged to participate in cardiac rehab. She is appropriate to begin cardiac rehab. Heart healthy diet and regular cardiovascular exercise encouraged.    PAD - Incidental finding on cardiac cath. Has upcoming ABIs and vascular surgery consult in January. Reports RLE pain with ambulation consistent with claudication.   HTN - BP well controlled. Continue current antihypertensive regimen.    HLD, LDL goal <70 - 05/06/21 LDL 65. Atorvastatin $RemoveBeforeDE'80mg'[id number]$  daily initiated during admission. Previously on Simvastatin. Repeat LFT/CMP in 6 weeks  Tobacco use - Smoking cessation encouraged. Recommend utilization of 1800QUITNOW.   DM2 - Continue to follow with PCP. 05/06/21 A1c 6.6. She is tolerating [place] without difficulty and it has a $0 copay.   Disposition: Follow up in 3 month(s) with Minus Breeding, MD or APP.  Signed, Loel Dubonnet, NP 05/22/2021, 8:40 AM [place]

## 2021-05-22 NOTE — Patient Instructions (Signed)
Medication Instructions:  Your Physician recommend you continue on your current medication as directed.    *If you need a refill on your cardiac medications before your next appointment, please call your pharmacy*   Lab Work: Please return for Lab work In 6 weeks for Fasting Lipid Panel and CMET. You may come to the...   [facility] (3rd floor) [address], [place], [place] [address]  Open: 8am-Noon and 1pm-4:30pm   [facility] [address]  Open: 8am-Noon and 1pm- 4:30pm  [employer]- Any location  **no appointments needed**  If you have labs (blood work) drawn today and your tests are completely normal, you will receive your results only by: [employer] (if you have MyChart) OR A paper copy in the mail If you have any lab test that is abnormal or we need to change your treatment, we will call you to review the results.   Testing/Procedures: None ordered today    Follow-Up: At [facility], you and your health needs are our priority.  As part of our continuing mission to provide you with exceptional heart care, we have created designated Provider Care Teams.  These Care Teams include your primary Cardiologist (physician) and Advanced Practice Providers (APPs -  Physician Assistants and Nurse Practitioners) who all work together to provide you with the care you need, when you need it.  We recommend signing up for the patient portal called "MyChart".  Sign up information is provided on this After Visit Summary.  MyChart is used to connect with patients for Virtual Visits (Telemedicine).  Patients are able to view lab/test results, encounter notes, upcoming appointments, etc.  Non-urgent messages can be sent to your provider as well.   To learn more about what you can do with MyChart, go to [email or web address].    Your next appointment:   3 month(s)  The format for your next appointment:   In  Person  Provider:   Rollene Rotunda, MD  or APP    Other Instructions Recommend reaching out to cardiac rehab and participating. It is a great program!

## 2021-05-22 NOTE — Telephone Encounter (Signed)
Transitions of Care Pharmacy  ° °Call attempted for a pharmacy transitions of care follow-up. HIPAA appropriate voicemail was left with call back information provided.  ° °Call attempt #1. Will follow-up in 2-3 days.  °  °

## 2021-05-26 ENCOUNTER — Other Ambulatory Visit (HOSPITAL_COMMUNITY): Payer: Self-pay

## 2021-05-26 ENCOUNTER — Telehealth (HOSPITAL_COMMUNITY): Payer: Self-pay

## 2021-05-26 NOTE — Telephone Encounter (Signed)
°  Pharmacy Transitions of Care Follow-up Telephone Call  Date of discharge: 05/07/21  Discharge Diagnosis: NSTEMI  How have you been since you were released from the hospital?  Patient doing well, no questions about meds at this time.  Medication changes made at discharge:     START taking: atorvastatin (LIPITOR)  clopidogrel (PLAVIX)  Farxiga (dapagliflozin propanediol)  lisinopril (ZESTRIL)  Icon medications to change how you take   CHANGE how you take: metoprolol succinate (TOPROL-XL)  Icon medications to stop taking   STOP taking: simvastatin 80 MG tablet (ZOCOR)  Replaced by a similar medication.  Medication changes verified by the patient? Yes    Medication Accessibility:  Home Pharmacy:  [facility] [address]  Was the patient provided with refills on discharged medications? Yes   Have all prescriptions been transferred from [facility] to home pharmacy?  Yes  Is the patient able to afford medications? Has insurance    Medication Review:  CLOPIDOGREL (PLAVIX) Clopidogrel 75 mg once daily.  - Educated patient on expected duration of therapy of ASA with clopidogrel.  - Advised patient of medications to avoid (NSAIDs, ASA)  - Educated that Tylenol (acetaminophen) will be the preferred analgesic to prevent risk of bleeding  - Emphasized importance of monitoring for signs and symptoms of bleeding (abnormal bruising, prolonged bleeding, nose bleeds, bleeding from gums, discolored urine, black tarry stools)  - Advised patient to alert all providers of anticoagulation therapy prior to starting a new medication or having a procedure   Follow-up Appointments:  PCP Hospital f/u appt confirmed?  Scheduled to see Dr. Peggyann Juba on 08/11/21 @ 3:00pm.   Specialist Hospital f/u appt confirmed?  Sees Dr. Dan Humphreys on 05/22/21. Scheduled to see Dr. [name] on 06/23/21 @ 3:00pm.   If their condition worsens, is the pt aware to call PCP or go to the Emergency Dept.? yes  Final Patient  Assessment: Patient has follow up scheduled and refills at home pharmacy.

## 2021-05-30 ENCOUNTER — Other Ambulatory Visit: Payer: Self-pay | Admitting: Psychiatry

## 2021-05-30 DIAGNOSIS — F411 Generalized anxiety disorder: Secondary | ICD-10-CM

## 2021-05-30 DIAGNOSIS — F3162 Bipolar disorder, current episode mixed, moderate: Secondary | ICD-10-CM

## 2021-05-30 DIAGNOSIS — F4001 Agoraphobia with panic disorder: Secondary | ICD-10-CM

## 2021-06-02 NOTE — Telephone Encounter (Signed)
90 day ok?

## 2021-06-10 ENCOUNTER — Telehealth: Payer: Self-pay

## 2021-06-10 NOTE — Telephone Encounter (Signed)
The alprazolam is written for 1 in the morning and 1 at night.  If she is taking it that way she can increase the dose to 1 in the morning and 2 tablets at night.  Do not go higher than that without her permission.  If that does not work call us back.  She has had chronic insomnia

## 2021-06-11 NOTE — Telephone Encounter (Signed)
Called patient and recommendations were given.  ?

## 2021-06-12 ENCOUNTER — Other Ambulatory Visit: Payer: Self-pay

## 2021-06-12 DIAGNOSIS — I739 Peripheral vascular disease, unspecified: Secondary | ICD-10-CM

## 2021-06-17 ENCOUNTER — Telehealth (HOSPITAL_COMMUNITY): Payer: Self-pay

## 2021-06-17 NOTE — Telephone Encounter (Signed)
Called and spoke with pt in regards to CR, pt stated she is not able to attend right now due to her work schedule. If anything changes she will contact CR at a later date.   Closed referral

## 2021-06-17 NOTE — Telephone Encounter (Signed)
Pt insurance is active and benefits verified through [employer]. Co-pay $25.00, DED $500.00/$0.00 met, out of pocket $2,000.00/$0.00 met, co-insurance 20%. No pre-authorization required. Passport, 06/17/21 @ 9:12AM, [phone]

## 2021-06-22 NOTE — Progress Notes (Signed)
[facility] [place] ° °ASSESSMENT / PLAN: °Bailey Greene is a 60 y.o. female with atherosclerosis of native arteries of right lower extremity causing very long distance claudication (>30 minutes of walking prior to sx) ° °Patient counseled patients with asymptomatic peripheral arterial disease or claudication have a 1-2% risk of developing chronic limb threatening ischemia, but a 15-30% risk of mortality in the next 5 years. Intervention should only be considered for medically optimized patients with disabling symptoms.  ° °Recommend the following which can slow the progression of atherosclerosis and reduce the risk of major adverse cardiac / limb events:  °Complete cessation from all tobacco products. °Blood glucose control with goal A1c < 7%. °Blood pressure control with goal blood pressure < 140/90 mmHg. °Lipid reduction therapy with goal LDL-C <100 mg/dL (<70 if symptomatic from PAD).  °Aspirin 81mg PO QD.  °Atorvastatin 40-80mg PO QD (or other "high intensity" statin therapy). °Daily walking to and past the point of discomfort. Patient counseled to keep a log of exercise distance. ° °Follow up in 6 months with repeat ABI. Sooner should she develop symptoms of chronic limb threatening ischemia. ° °CHIEF COMPLAINT: Peripheral arterial disease demonstrated on recent angiogram. ° °HISTORY OF PRESENT ILLNESS: °Bailey Greene is a 60 y.o. female for to clinic after recent cardiac catheterization demonstrated calcified common femoral artery and occluded right superficial femoral artery.  The patient is relatively asymptomatic from a peripheral arterial disease standpoint.  She reports some discomfort and a sensation of "heaviness" in her calves after walking for more than 30 minutes.  She is able to do all of her independent activities of daily living without difficulty.  She can shop through a grocery store without discomfort in her calves.  She does not report symptoms of rest pain.   She does not have any ischemic ulceration about her feet. ° °Past Medical History:  °Diagnosis Date  ° Anxiety   ° Bipolar affective disorder (HCC)   ° CAD (coronary artery disease) 2006  ° CABG w/ LIMA-LAD, RIMA-Diag, SVG-OM1-OM2, R radial-PDA  ° 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  ° Qualifier: Diagnosis of  By: Alleyne, Tiffany    ° Diabetes mellitus type 2 in nonobese (HCC) 07/2016  ° Dyslipidemia   ° Headache(784.0)   ° History of pulmonary embolism 07/08/2020  ° HTN (hypertension)   ° Migraine   ° Pulmonary embolus (HCC)   ° unprovoked  ° ° °Past Surgical History:  °Procedure Laterality Date  ° BUNIONECTOMY  08/2011  ° right foot  ° CARDIAC CATHETERIZATION  2007  ° severe native 3 v dz, all grafts patent (LIMA-LAD, RIMA-Diag, SVG-OM1-OM2, R radial-PDA)  ° CESAREAN SECTION  1984  ° CORONARY ARTERY BYPASS GRAFT  2006  °  Coronary artery bypass grafting x5 with a left  internal  mammary to the left anterior descending coronary artery.  Free right  internal mammary to the diagonal coronary artery, sequential reverse  saphenous vein graft to the first and second obtuse marginal, right  artery bypass to the posterior descending coronary artery with endo-vein harvesting.  ° LEFT HEART CATH AND CORS/GRAFTS ANGIOGRAPHY N/A 05/05/2021  ° Procedure: LEFT HEART CATH AND CORS/GRAFTS ANGIOGRAPHY;  Surgeon: Harding, David W, MD;  Location: [facility];  Service: Cardiovascular;  Laterality: N/A;  ° UMBILICAL HERNIA REPAIR N/A 01/16/2021  ° Procedure: UMBILICAL  HERNIA REPAIR;  Surgeon: Allen, Shelby L, MD;  Location: [facility];  Service: General;  Laterality: N/A;  ° ° °Family History  °Problem Relation Age   of Onset  ° Lupus Mother   ° Heart attack Father   ° Diabetes Father   ° Hypertension Father   ° Diabetes Paternal Grandmother   ° Hypertension Paternal Grandmother   ° Stroke Neg Hx   ° Kidney disease Neg Hx   ° Hyperlipidemia Neg Hx   ° Sudden death Neg Hx   ° ° °Social History  ° °Socioeconomic History  ° Marital status: Legally  Separated  °  Spouse name: Not on file  ° Number of children: Not on file  ° Years of education: Not on file  ° Highest education level: Not on file  °Occupational History  ° Occupation: mortgage loan specialist  °Tobacco Use  ° Smoking status: Some Days  °  Packs/day: 0.50  °  Years: 25.00  °  Pack years: 12.50  °  Types: Cigarettes  ° Smokeless tobacco: Never  °Vaping Use  ° Vaping Use: Never used  °Substance and Sexual Activity  ° Alcohol use: No  °  Alcohol/week: 0.0 standard drinks  ° Drug use: Never  ° Sexual activity: Yes  °  Partners: Male  °  Comment: married  °Other Topics Concern  ° Not on file  °Social History Narrative  ° Regular exercise:  3 days weekly  ° Caffeine Use:  1 soda daily  ° One child biological daughter born in 1984 and an adopted niece.  ° [employer]- Mortgage specialist  ° Married- may be divorcing.   °   °   ° °Social Determinants of Health  ° °Financial Resource Strain: Not on file  °Food Insecurity: Not on file  °Transportation Needs: Not on file  °Physical Activity: Not on file  °Stress: Not on file  °Social Connections: Not on file  °Intimate Partner Violence: Not on file  ° ° °Allergies  °Allergen Reactions  ° Metformin And Related Nausea And Vomiting  ° ° °Current Outpatient Medications  °Medication Sig Dispense Refill  ° Accu-Chek Softclix Lancets lancets USE UP TO FOUR TIMES DAILY AS DIRECTED 200 each 12  ° ALPRAZolam (XANAX) 0.5 MG tablet TAKE 2 TABLETS BY MOUTH TWICE A DAY AS NEEDED FOR ANXIETY 60 tablet 1  ° aspirin 81 MG EC tablet Take 1 tablet (81 mg total) by mouth daily. Swallow whole. 30 tablet 12  ° atorvastatin (LIPITOR) 80 MG tablet Take 1 tablet (80 mg total) by mouth daily. 90 tablet 3  ° blood glucose meter kit and supplies KIT Dispense based on patient and insurance preference. Use up to four times daily as directed. (FOR ICD-9 250.00, 250.01). 1 each 0  ° busPIRone (BUSPAR) 30 MG tablet TAKE 1 TABLET BY MOUTH TWICE A DAY 60 tablet 0  ° clopidogrel (PLAVIX)  75 MG tablet Take 1 tablet (75 mg total) by mouth daily with breakfast. 90 tablet 3  ° dapagliflozin propanediol (FARXIGA) 10 MG TABS tablet Take 1 tablet (10 mg total) by mouth daily before breakfast. 30 tablet 11  ° divalproex (DEPAKOTE ER) 500 MG 24 hr tablet Take 3 tablets (1,500 mg total) by mouth at bedtime. 270 tablet 0  ° Glycerin-Hypromellose-PEG 400 (VISINE DRY EYE OP) Place 1 drop into both eyes 3 (three) times daily as needed (dry eyes).    ° Insulin Glargine (BASAGLAR KWIKPEN) 100 UNIT/ML Inject 12 Units into the skin daily. 9 mL 3  ° Insulin Pen Needle (BD PEN NEEDLE NANO 2ND GEN) 32G X 4 MM MISC USE AS DIRECTED 100 each 3  ° isosorbide   mononitrate (IMDUR) 60 MG 24 hr tablet Take 1 tablet (60 mg total) by mouth daily. 90 tablet 3   JANUVIA 50 MG tablet Take 50 mg by mouth daily.     lisinopril (ZESTRIL) 10 MG tablet Take 1 tablet (10 mg total) by mouth daily. 30 tablet 11   metoprolol succinate (TOPROL-XL) 25 MG 24 hr tablet Take 1.5 tablets (37.5 mg total) by mouth daily. 30 tablet 11   nitroGLYCERIN (NITROSTAT) 0.4 MG SL tablet Place 1 tablet (0.4 mg total) under the tongue every 5 (five) minutes as needed for chest pain. 25 tablet 2   OLANZapine (ZYPREXA) 20 MG tablet TAKE 1 TABLET BY MOUTH EVERYDAY AT BEDTIME 90 tablet 0   PARoxetine (PAXIL) 20 MG tablet TAKE 2 TABLETS BY MOUTH EVERY DAY 180 tablet 1   XIIDRA 5 % SOLN Place 1 drop into both eyes in the morning and at bedtime.     oxyCODONE (OXY IR/ROXICODONE) 5 MG immediate release tablet Take 1 tablet (5 mg total) by mouth every 6 (six) hours as needed for severe pain. (Patient not taking: Reported on 06/23/2021) 15 tablet 0   No current facility-administered medications for this visit.    REVIEW OF SYSTEMS:  [X] denotes positive finding, [ ] denotes negative finding Cardiac  Comments:  Chest pain or chest pressure:    Shortness of breath upon exertion:    Short of breath when lying flat:    Irregular heart rhythm:         Vascular    Pain in calf, thigh, or hip brought on by ambulation:    Pain in feet at night that wakes you up from your sleep:     Blood clot in your veins:    Leg swelling:         Pulmonary    Oxygen at home:    Productive cough:     Wheezing:         Neurologic    Sudden weakness in arms or legs:     Sudden numbness in arms or legs:     Sudden onset of difficulty speaking or slurred speech:    Temporary loss of vision in one eye:     Problems with dizziness:         Gastrointestinal    Blood in stool:     Vomited blood:         Genitourinary    Burning when urinating:     Blood in urine:        Psychiatric    Major depression:         Hematologic    Bleeding problems:    Problems with blood clotting too easily:        Skin    Rashes or ulcers:        Constitutional    Fever or chills:      PHYSICAL EXAM Vitals:   06/23/21 1514  BP: 102/65  Pulse: 73  Temp: 97.7 F (36.5 C)  Weight: 137 lb 8 oz (62.4 kg)  Height: 5' 6" (1.676 m)    Constitutional: well appearing. no distress. Appears well nourished.  Neurologic: CN intact. no focal findings. no sensory loss. Psychiatric:  Mood and affect symmetric and appropriate. Eyes:  No icterus. No conjunctival pallor. Ears, nose, throat:  mucous membranes moist. Midline trachea.  Cardiac: regular rate and rhythm.  Respiratory:  unlabored. Abdominal:  soft, non-tender, non-distended.  Peripheral vascular: no palpable pedal pulses. Feet are warm. Extremity:  no edema. no cyanosis. no pallor.  Skin: no gangrene. no ulceration.  Lymphatic: no Stemmer's sign. no palpable lymphadenopathy.  PERTINENT LABORATORY AND RADIOLOGIC DATA  Most recent CBC CBC Latest Ref Rng & Units 05/07/2021 05/06/2021 05/05/2021  WBC 4.0 - 10.5 K/uL 9.7 10.3 8.8  Hemoglobin 12.0 - 15.0 g/dL 12.8 12.5 13.0  Hematocrit 36.0 - 46.0 % 37.3 36.4 38.6  Platelets 150 - 400 K/uL 199 180 207     Most recent CMP CMP Latest Ref Rng & Units  05/12/2021 05/06/2021 05/05/2021  Glucose 70 - 99 mg/dL 107(H) 116(H) 179(H)  BUN 6 - 23 mg/dL _0 Creatinine 0.40 - 1.20 mg/dL 1.19 0.77 0.92  Sodium 135 - 145 mEq/L 138 140 132(L)  Potassium 3.5 - 5.1 mEq/L 4.2 3.9 3.7  Chloride 96 - 112 mEq/L 103 108 100  CO2 19 - 32 mEq/L _1 Calcium 8.4 - 10.5 mg/dL 9.5 9.2 9.3  Total Protein 6.0 - 8.3 g/dL - - -  Total Bilirubin 0.2 - 1.2 mg/dL - - -  Alkaline Phos 39 - 117 U/L - - -  AST 0 - 37 U/L - - -  ALT 0 - 35 U/L - - -    Renal function CrCl cannot be calculated (Patient's most recent lab result is older than the maximum 21 days allowed.).  Hgb A1c MFr Bld (%)  Date Value  05/06/2021 6.6 (H)    LDL Chol Calc (NIH)  Date Value Ref Range Status  07/17/2020 65 0 - 99 mg/dL Final   LDL Cholesterol  Date Value Ref Range Status  05/06/2021 65 0 - 99 mg/dL Final    Comment:           Total Cholesterol/HDL:CHD Risk Coronary Heart Disease Risk Table                     Men   Women  1/2 Average Risk   3.4   3.3  Average Risk       5.0   4.4  2 X Average Risk   9.6   7.1  3 X Average Risk  23.4   11.0        Use the calculated Patient Ratio above and the CHD Risk Table to determine the patient's CHD Risk.        ATP III CLASSIFICATION (LDL):  <100     mg/dL   Optimal  100-129  mg/dL   Near or Above                    Optimal  130-159  mg/dL   Borderline  160-189  mg/dL   High  >190     mg/dL   Very High Performed at [facility] [address], [place], [place] [address]    Direct LDL  Date Value Ref Range Status  07/18/2008 145.2 mg/dL Final    Comment:    See lab report for associated comment(s)     +-------+-----------+-----------+------------+------------+   ABI/TBI Today's ABI Today's TBI Previous ABI Previous TBI   +-------+-----------+-----------+------------+------------+   Right   0.56        0                                        +-------+-----------+-----------+------------+------------+   Left    0.69  0.43                                    °+-------+-----------+-----------+------------+------------+  ° ° N. , MD °[facility]  °Office Phone Number: [phone] °06/23/2021 4:31 PM ° °Total time spent on preparing this encounter including chart review, data review, collecting history, examining the patient, coordinating care for this new patient, 60 minutes. ° °Portions of this report may have been transcribed using voice recognition software.  Every effort has been made to ensure accuracy; however, inadvertent computerized transcription errors may still be present. ° ° ° °

## 2021-06-23 ENCOUNTER — Other Ambulatory Visit: Payer: Self-pay

## 2021-06-23 ENCOUNTER — Ambulatory Visit: Payer: 59 | Admitting: Vascular Surgery

## 2021-06-23 ENCOUNTER — Encounter: Payer: Self-pay | Admitting: Vascular Surgery

## 2021-06-23 ENCOUNTER — Ambulatory Visit (HOSPITAL_COMMUNITY)
Admission: RE | Admit: 2021-06-23 | Discharge: 2021-06-23 | Disposition: A | Discharge status: Home / Self Care | Payer: 59 | Source: Ambulatory Visit | Attending: Vascular Surgery | Admitting: Vascular Surgery

## 2021-06-23 VITALS — BP 102/65 | HR 73 | Temp 97.7°F | Ht 66.0 in | Wt 137.5 lb

## 2021-06-23 DIAGNOSIS — I739 Peripheral vascular disease, unspecified: Secondary | ICD-10-CM | POA: Insufficient documentation

## 2021-06-24 ENCOUNTER — Other Ambulatory Visit: Payer: Self-pay | Admitting: Cardiovascular Disease

## 2021-06-30 ENCOUNTER — Other Ambulatory Visit: Payer: Self-pay

## 2021-06-30 ENCOUNTER — Ambulatory Visit (INDEPENDENT_AMBULATORY_CARE_PROVIDER_SITE_OTHER): Payer: 59 | Admitting: Psychiatry

## 2021-06-30 ENCOUNTER — Other Ambulatory Visit: Payer: Self-pay | Admitting: *Deleted

## 2021-06-30 ENCOUNTER — Encounter: Payer: Self-pay | Admitting: Psychiatry

## 2021-06-30 DIAGNOSIS — F5105 Insomnia due to other mental disorder: Secondary | ICD-10-CM

## 2021-06-30 DIAGNOSIS — F3162 Bipolar disorder, current episode mixed, moderate: Secondary | ICD-10-CM | POA: Diagnosis not present

## 2021-06-30 DIAGNOSIS — F411 Generalized anxiety disorder: Secondary | ICD-10-CM | POA: Diagnosis not present

## 2021-06-30 DIAGNOSIS — F4001 Agoraphobia with panic disorder: Secondary | ICD-10-CM | POA: Diagnosis not present

## 2021-06-30 DIAGNOSIS — I739 Peripheral vascular disease, unspecified: Secondary | ICD-10-CM

## 2021-06-30 MED ORDER — ALPRAZOLAM 0.5 MG PO TABS: ORAL_TABLET | ORAL | 3 refills | Status: DC

## 2021-06-30 MED ORDER — DIVALPROEX SODIUM ER 500 MG PO TB24: 1500.0000 mg | ORAL_TABLET | Freq: Every day | ORAL | 0 refills | Status: DC

## 2021-06-30 NOTE — Progress Notes (Signed)
Bailey Greene [id number] 1960/11/23 61 y.o.   Subjective:   Patient ID:  Bailey Greene is a 61 y.o. (DOB 1961-04-22) female.  Chief Complaint:  Chief Complaint  Patient presents with   Follow-up    Moderate mixed bipolar I disorder ([place]) Panic disorder with agoraphobia   Anxiety   Sleeping Problem   Stress    Anxiety Symptoms include nervous/anxious behavior. Patient reports no chest pain, decreased concentration, dizziness, nausea or palpitations.    Bailey Greene presents for follow-up of bipolar disorder and panic attacks and general anxiety.  visit 5/22,2020.  Increased Depakote then to 1500 mg daily.  Boss rec LOA for 4 weeks.  Going through separation is really tough.  Panic attacks at work interfering.  Had this job 10 years.  Likes the job but hard to concentrate and stay focused.  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  Panic incr pulse and SOB, fear, sweating and shakey.  Panic lasts 20 mins and increased frequency.  Several in a day.  Going on for 2 mos but getting worse.  Boss can tell from listening to her calls and drop in production.  At follow up visit November 22, 2018.  Mood swings are better.  Trouble staying asleep.  Caffeine 1 coffee and 1 soda daily.She was still having severe anxiety plus panic.  We discussed the risk of SSRIs trickling triggering mood swings but the severity of the anxiety was such that we decided to initiate fluoxetine 10 mg daily to increase to 20 mg daily.  We also were using low-dose mirtazapine to help with sleep.  seen January 18, 2019.  She was granted medical leave for panic symptoms.  She was switched to paroxetine for panic from fluoxetine.  Since she was switched from mirtazapine to quetiapine 25 to 50 mg nightly for insomnia.   November 2020 visit with the following noted: It is helping some with anxiety but a lot of stress.  No unusual mood swings without a change.   She's satisfied with the 20 mg paroxetine. Sleep is  much better with quetiapine and xanax at night.  5 hour and sometimes better depending on work schedule.   No meds were changed.    10/23/2019 visit with the following noted: Anxiety, dep, panic attacks all worse lately for 2 mos.  Anger also worse mostly just at home bc manages it at work.  Several triggers.  Sister passed March 26 after illness.  Work and daughter are stressful.  Overwhelming. Not as good staying asleep.  Random panic.  Feels SOB.  Wanting to isolate. Spontaneous crying spell.s Poor concentration affecting work.    Plan: Increase paroxetine to 1-1/2 of the 20 mg tablets daily For sleep increase quetiapine to 2 the 25 mg tablets nightly  03/05/20 appt with following noted: Less anxious and sleeping is better.  Panic can be triggered at work with SOB and heart racing.  Happens about 2 times weekly. Main stress is work is overwhelming.  Talk with customers all day.   No clear mania.   Still some depression too. No SE Plan: no med changes except increase paroxetine to 40 for anxiety  09/02/2020 appt noted: Dx DM since here. Anxiety and panic worse since January.  Consistent with meds.  Stress dx DM.  Sister passed away.  Panic with SOB and occ sweats.  Panic daily. Usually before noon usually with trigger. Xanax makes her relax. 1 cup coffee AM.   Sleep 4-5 hours with quetiapine 25 mg.  And pretty consistent. Mood feels labile.  About to lose it including irritable and angry.   Plan: Increase Quetiapine 25 mg 2 mg HS.   12/10/20 appt noted: Dizziniess for awhile after paroxetine resolved. Still having anxiety and panic but not daily.   Average 7/10 anxiety usually triggered with work as primary stress.  She and H still dealing with problems.  Panic worse either in the AM or evening. Sleep is better 4-5 hours nightly. Taking quetiapine 50 mg with Xanax at night. Still on Depakote ER 1500, busipirone 30 BID , paroxetine 40 mg daily No anger problems at work.  Appetite is  better.  Regaining some weight. Evening walks helps mental health. Plan: Start olanzapine 5 mg daily and if that is not sufficient within a week increase to 10 mg nightly.  We can go higher if needed and call if necessary. BC failure of alternatives, alprazolam 0.5 mg AM before work.   02/18/2021 appointment with the following noted: Anx down to 4-5/10.  Notices calmer with Xanax and throughout the day.Less anxiety and panic at work but getting better. Depression about the same 6/10.  Tries to distract herself from problems at home.. Sleep 6 hours now with olanzapine.   No SE Plan: increase olanzapine 15 mg HS. BC failure of alternatives, alprazolam 0.5 mg AM before work.   paroxetine to 40 mg at night.  04/28/2021 appointment with the following noted: Increase olanzapine 15 mg HS helped awhile with initial dizziness resolved.  Not drowsy. Still better than it was but holidays are hard.  Better than in a long time overall.  H saw a difference also.   Sleep 5-6 hours.  Never been a 7 hour sleeper.   Eating is normal now. Depression still there but better also 3/10.  Can enjoy some things. Better interest. Most stressful thing about work is the work load and talking with customers who  are difficult.  Had this job 13 years. Plan: Increase olanzapine 20 mg 3 hours before HS.  06/10/2021 phone call complaining of persistent insomnia.  She was allowed to increase alprazolam to 0.5 mg every morning and 1 mg nightly  06/30/2021 appointment with the following noted: Taking alprazolam 0.5 mg 2 daily usually. Increased olanzapine to 20 mg daily. CABG 2006.  Valve problem with CP and hospitalized.  Change in med. Going better now. No problems with meds.   About 6 hour sleep and in bed earlier.  Depression is OK overall but residual anxiety and crying.  Anxiety around health now too and work. Panic is not gone but better on Paroxetine 20 Sometimes brief irritab ility situationally.  PDMP only  shows Xanax.  She denies abusing substances  Past Psychiatric Medication Trials: Hydroxyzine, mirtazapine poor response for sleep,  Depakote,  Xanax, buspirone,  quetiapine low-dose for sleep Olanzapine 20 duloxetine, citalopram, Wellbutrin, fluoxetine, paroxetine 40  Notes and chart were reviewed with the patient regarding prior history and symptoms.  Review of Systems:  Review of Systems  Cardiovascular:  Negative for chest pain and palpitations.  Gastrointestinal:  Negative for nausea.  Neurological:  Negative for dizziness, tremors and weakness.  Psychiatric/Behavioral:  Positive for dysphoric mood. Negative for decreased concentration and sleep disturbance. The patient is nervous/anxious.    Medications: I have reviewed the patient's current medications.  Current Outpatient Medications  Medication Sig Dispense Refill   Accu-Chek Softclix Lancets lancets USE UP TO FOUR TIMES DAILY AS DIRECTED 200 each 12   aspirin 81 MG EC tablet Take  1 tablet (81 mg total) by mouth daily. Swallow whole. 30 tablet 12   atorvastatin (LIPITOR) 80 MG tablet Take 1 tablet (80 mg total) by mouth daily. 90 tablet 3   blood glucose meter kit and supplies KIT Dispense based on patient and insurance preference. Use up to four times daily as directed. (FOR ICD-9 250.00, 250.01). 1 each 0   busPIRone (BUSPAR) 30 MG tablet TAKE 1 TABLET BY MOUTH TWICE A DAY 60 tablet 0   clopidogrel (PLAVIX) 75 MG tablet Take 1 tablet (75 mg total) by mouth daily with breakfast. 90 tablet 3   dapagliflozin propanediol (FARXIGA) 10 MG TABS tablet Take 1 tablet (10 mg total) by mouth daily before breakfast. 30 tablet 11   Glycerin-Hypromellose-PEG 400 (VISINE DRY EYE OP) Place 1 drop into both eyes 3 (three) times daily as needed (dry eyes).     Insulin Glargine (BASAGLAR KWIKPEN) 100 UNIT/ML Inject 12 Units into the skin daily. 9 mL 3   Insulin Pen Needle (BD PEN NEEDLE NANO 2ND GEN) 32G X 4 MM MISC USE AS DIRECTED 100 each  3   isosorbide mononitrate (IMDUR) 60 MG 24 hr tablet Take 1 tablet (60 mg total) by mouth daily. 90 tablet 3   JANUVIA 50 MG tablet Take 50 mg by mouth daily.     lisinopril (ZESTRIL) 10 MG tablet Take 1 tablet (10 mg total) by mouth daily. 30 tablet 11   metoprolol succinate (TOPROL-XL) 25 MG 24 hr tablet Take 1.5 tablets (37.5 mg total) by mouth daily. 30 tablet 11   nitroGLYCERIN (NITROSTAT) 0.4 MG SL tablet PLACE 1 TABLET UNDER THE TONGUE EVERY 5 MINUTES AS NEEDED FOR CHOEST PAIN 25 tablet 1   OLANZapine (ZYPREXA) 20 MG tablet TAKE 1 TABLET BY MOUTH EVERYDAY AT BEDTIME 90 tablet 0   PARoxetine (PAXIL) 20 MG tablet TAKE 2 TABLETS BY MOUTH EVERY DAY 180 tablet 1   XIIDRA 5 % SOLN Place 1 drop into both eyes in the morning and at bedtime.     ALPRAZolam (XANAX) 0.5 MG tablet TAKE 2 TABLETS BY MOUTH TWICE A DAY AS NEEDED FOR ANXIETY 60 tablet 3   divalproex (DEPAKOTE ER) 500 MG 24 hr tablet Take 3 tablets (1,500 mg total) by mouth at bedtime. 270 tablet 0   oxyCODONE (OXY IR/ROXICODONE) 5 MG immediate release tablet Take 1 tablet (5 mg total) by mouth every 6 (six) hours as needed for severe pain. (Patient not taking: Reported on 06/23/2021) 15 tablet 0   No current facility-administered medications for this visit.    Medication Side Effects: ? 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   Qualifier: Diagnosis of  By: Garen Grams     Diabetes mellitus type 2 in nonobese ([place]) 07/2016   Dyslipidemia    Headache(784.0)    History of pulmonary embolism 07/08/2020   HTN (hypertension)    Migraine    Pulmonary embolus ([facility])    unprovoked    Family History  Problem Relation Age of Onset   Lupus Mother     Heart attack Father    Diabetes Father    Hypertension Father    Diabetes Paternal Grandmother  Hypertension Paternal Grandmother    Stroke Neg Hx    Kidney disease Neg Hx    Hyperlipidemia Neg Hx    Sudden death Neg Hx     Social History   Socioeconomic History   Marital status: Legally Separated    Spouse name: Not on file   Number of children: Not on file   Years of education: Not on file   Highest education level: Not on file  Occupational History   Occupation: mortgage loan specialist  Tobacco Use   Smoking status: Some Days    Packs/day: 0.50    Years: 25.00    Pack years: 12.50    Types: Cigarettes   Smokeless tobacco: Never  Vaping Use   Vaping Use: Never used  Substance and Sexual Activity   Alcohol use: No    Alcohol/week: 0.0 standard drinks   Drug use: Never   Sexual activity: Yes    Partners: Male    Comment: married  Other Topics Concern   Not on file  Social History Narrative   Regular exercise:  3 days weekly   Caffeine Use:  1 soda daily   One child biological daughter born in 41 and an adopted niece.   [place] specialist   Married- may be divorcing.          Social Determinants of Health   Financial Resource Strain: Not on file  Food Insecurity: Not on file  Transportation Needs: Not on file  Physical Activity: Not on file  Stress: Not on file  Social Connections: Not on file  Intimate Partner Violence: Not on file    Past Medical History, Surgical history, Social history, and Family history were reviewed and updated as appropriate.   Please see review of systems for further details on the patient's review from today.   Objective:   Physical Exam:  LMP 11/28/2009   Physical Exam Constitutional:      General: She is not in acute distress.    Appearance: She is well-developed.  Musculoskeletal:        General: No deformity.  Neurological:     Mental Status: She is alert and oriented to person, place,  and time.     Coordination: Coordination normal.  Psychiatric:        Attention and Perception: She is attentive. She does not perceive auditory hallucinations.        Mood and Affect: Mood is anxious. Mood is not depressed. Affect is not labile, blunt, angry or tearful.        Speech: Speech normal. Speech is not rapid and pressured.        Behavior: Behavior normal. Behavior is not agitated.        Thought Content: Thought content normal. Thought content is not paranoid or delusional. Thought content does not include homicidal or suicidal ideation.        Cognition and Memory: Cognition normal.        Judgment: Judgment normal.     Comments: Insight intact. No auditory or visual hallucinations. No delusions.  Improved not resolved    Lab Review:     Component Value Date/Time   NA 138 05/12/2021 1650   NA 141 07/23/2020 1300   K 4.2 05/12/2021 1650   CL 103 05/12/2021 1650   CO2 28 05/12/2021 1650   GLUCOSE 107 (H) 05/12/2021 1650   BUN 19 05/12/2021 1650   BUN 7 07/23/2020 1300   CREATININE 1.19 05/12/2021 1650   CREATININE  0.72 06/22/2012 0840   CALCIUM 9.5 05/12/2021 1650   PROT 6.6 10/29/2020 1017   PROT 5.8 (L) 07/17/2020 1009   ALBUMIN 4.5 10/29/2020 1017   ALBUMIN 3.6 (L) 07/17/2020 1009   AST 12 10/29/2020 1017   ALT 9 10/29/2020 1017   ALKPHOS 95 10/29/2020 1017   BILITOT 0.5 10/29/2020 1017   BILITOT 0.3 07/17/2020 1009   GFRNONAA >60 05/06/2021 0351   GFRNONAA >89 06/22/2012 0840   GFRAA 102 07/23/2020 1300   GFRAA >89 06/22/2012 0840       Component Value Date/Time   WBC 9.7 05/07/2021 0149   RBC 4.33 05/07/2021 0149   HGB 12.8 05/07/2021 0149   HCT 37.3 05/07/2021 0149   PLT 199 05/07/2021 0149   MCV 86.1 05/07/2021 0149   MCH 29.6 05/07/2021 0149   MCHC 34.3 05/07/2021 0149   RDW 13.8 05/07/2021 0149   LYMPHSABS 2.7 06/22/2012 0840   MONOABS 0.5 06/22/2012 0840   EOSABS 0.1 06/22/2012 0840   BASOSABS 0.0 06/22/2012 0840    No results found  for: POCLITH, LITHIUM   No results found for: PHENYTOIN, PHENOBARB, VALPROATE, CBMZ   .res Assessment: Plan:    Bailey Greene was seen today for follow-up, anxiety, sleeping problem and stress.  Diagnoses and all orders for this visit:  Moderate mixed bipolar I disorder ([facility]) -     divalproex (DEPAKOTE ER) 500 MG 24 hr tablet; Take 3 tablets (1,500 mg total) by mouth at bedtime.  Panic disorder with agoraphobia -     ALPRAZolam (XANAX) 0.5 MG tablet; TAKE 2 TABLETS BY MOUTH TWICE A DAY AS NEEDED FOR ANXIETY  Generalized anxiety disorder  Insomnia due to mental condition   Greater than 50% of 30 min face to face time with patient was spent on counseling and coordination of care. We discussed the following: she and her husband have both noted significant improvement and her level of anxiety and she is less depressed with the addition of olanzapine and the increased to 20 mg daily.  It is also a mood stabilizer.  It also can help with depression when added to an SSRI.  We discussed side effects in detail.  This is the usual max dose. Consider further increase olanzapine if necessary Continue olanzapine 20 mg 3 hours before HS.  BC failure of alternatives, alprazolam 0.5 mg AM before work twice daily.  Alternatives clonazepam, Ativan  Because of the severity of the panic continue paroxetine to 40 mg at night.  It was explained to her this could worsen her mood cycling and to let us know if that occurs.  Fortunately because no mood swings and her panic and anxiety symptoms are improved but not controlled.    Discussed potential metabolic side effects associated with atypical antipsychotics, as well as potential risk for movement side effects. Advised pt to contact office if movement side effects occur.  Metabolic side effects are unlikely at this low dose of quetiapine and almost no risk of EPS at this low-dose.   We discussed the short-term risks associated with benzodiazepines including  sedation and increased fall risk among others.  Discussed long-term side effect risk including dependence, potential withdrawal symptoms, and the potential eventual dose-related risk of dementia.   FMLA same as last year.  This appt was 30 mins.   Follow-up 3 mos  Lynder Parents MD, DFAPA  Please see After Visit Summary for patient specific instructions.   Future Appointments  Date Time Provider [place]  08/11/2021  3:00  PM [name], NP [facility]  08/20/2021 11:00 AM Minus Breeding, MD [facility]    No orders of the defined types were placed in this encounter.      -------------------------------

## 2021-07-05 ENCOUNTER — Encounter: Payer: Self-pay | Admitting: Family

## 2021-07-07 ENCOUNTER — Other Ambulatory Visit: Payer: Self-pay | Admitting: Psychiatry

## 2021-07-07 ENCOUNTER — Other Ambulatory Visit: Payer: Self-pay | Admitting: Family

## 2021-07-07 DIAGNOSIS — F3162 Bipolar disorder, current episode mixed, moderate: Secondary | ICD-10-CM

## 2021-07-07 DIAGNOSIS — F411 Generalized anxiety disorder: Secondary | ICD-10-CM

## 2021-07-07 DIAGNOSIS — F4001 Agoraphobia with panic disorder: Secondary | ICD-10-CM

## 2021-07-08 DIAGNOSIS — Z0279 Encounter for issue of other medical certificate: Secondary | ICD-10-CM

## 2021-07-08 NOTE — Telephone Encounter (Signed)
Pended to Dr. Cottle 

## 2021-07-15 ENCOUNTER — Other Ambulatory Visit: Payer: Self-pay | Admitting: Psychiatry

## 2021-07-15 ENCOUNTER — Other Ambulatory Visit: Payer: Self-pay | Admitting: Family

## 2021-07-15 DIAGNOSIS — F4001 Agoraphobia with panic disorder: Secondary | ICD-10-CM

## 2021-07-15 DIAGNOSIS — F411 Generalized anxiety disorder: Secondary | ICD-10-CM

## 2021-07-15 DIAGNOSIS — F3162 Bipolar disorder, current episode mixed, moderate: Secondary | ICD-10-CM

## 2021-08-05 ENCOUNTER — Other Ambulatory Visit: Payer: Self-pay | Admitting: Psychiatry

## 2021-08-05 DIAGNOSIS — F411 Generalized anxiety disorder: Secondary | ICD-10-CM

## 2021-08-11 ENCOUNTER — Ambulatory Visit (INDEPENDENT_AMBULATORY_CARE_PROVIDER_SITE_OTHER): Payer: 59 | Admitting: Family

## 2021-08-11 DIAGNOSIS — I739 Peripheral vascular disease, unspecified: Secondary | ICD-10-CM

## 2021-08-11 DIAGNOSIS — I10 Essential (primary) hypertension: Secondary | ICD-10-CM

## 2021-08-11 DIAGNOSIS — F411 Generalized anxiety disorder: Secondary | ICD-10-CM

## 2021-08-11 DIAGNOSIS — E119 Type 2 diabetes mellitus without complications: Secondary | ICD-10-CM

## 2021-08-11 MED ORDER — METOPROLOL SUCCINATE ER 25 MG PO TB24: 25.0000 mg | ORAL_TABLET | Freq: Every day | ORAL | 11 refills | Status: DC

## 2021-08-11 NOTE — Assessment & Plan Note (Signed)
BP Readings from Last 3 Encounters:  ?08/11/21 (!) 95/56  ?06/23/21 102/65  ?05/22/21 110/60  ? ?Overtreated. Will decrease toprol xl from 37.5 to 25mg  once daily. She will send me updated home readings after this change via mychart.  ?

## 2021-08-11 NOTE — Assessment & Plan Note (Signed)
Lab Results  ?Component Value Date  ? HGBA1C 6.6 (H) 05/06/2021  ? HGBA1C 6.7 (H) 05/05/2021  ? HGBA1C 6.8 (H) 01/07/2021  ? ?Lab Results  ?Component Value Date  ? MICROALBUR 0.4 07/18/2020  ? LDLCALC 65 05/06/2021  ? CREATININE 1.19 05/12/2021  ? ? ?

## 2021-08-11 NOTE — Assessment & Plan Note (Signed)
I encouraged her to continue her follow up with psychiatry and establish with a counselor.  ?

## 2021-08-11 NOTE — Patient Instructions (Signed)
Please change toprol xl to one tablet once daily.  ?Check blood pressure once daily at home for a few days and then send me your readings via mychart.  ?

## 2021-08-11 NOTE — Progress Notes (Signed)
? ?Subjective:  ? ?By signing my name below, I, Bailey Greene, attest that this documentation has been prepared under the direction and in the presence of [name], 08/11/2021 ? ? Patient ID: Bailey Greene, female    DOB: 12/25/1960, 61 y.o.   MRN: [id number] ? ?Chief Complaint  ?Patient presents with  ? Diabetes  ?  Here for follow up  ? Hypertension  ?  Here for follow up  ? Anxiety  ? ? ?HPI ?Patient is in today for an office visit. ? ?PAD - Patient complains of persistent PAD symptoms. When she's walking, she experiences cramps in her lower legs. Her symptoms are more prominent on her left leg than her right leg. She is decreasing her frequency of smoking.  ? ?Refill - Patient does not need refills at this time.  ? ?Blood Sugar - She is continuing to take 10 MG of [place].  ? ?Lab Results  ?Component Value Date  ? HGBA1C 6.6 (H) 05/06/2021  ? ?Blood Pressure- maintained on lisinopril 10 mg and toprol xl 37.5 mg.  ?BP Readings from Last 3 Encounters:  ?08/11/21 (!) 95/56  ?06/23/21 102/65  ?05/22/21 110/60  ? ?Cholesterol - Her bad cholesterol levels have improved. She is continuing to take 80 MG of Lipitor. ?Lab Results  ?Component Value Date  ? CHOL 137 05/06/2021  ? HDL 49 05/06/2021  ? [place] 65 05/06/2021  ? LDLDIRECT 145.2 07/18/2008  ? TRIG 113 05/06/2021  ? CHOLHDL 2.8 05/06/2021  ? ?Depression/Anxiety - She continues to work with psychiatry. She has had increased anxiety related to her recent cardiac issues and PAD diagnosis.  ? ? There are no preventive care reminders to display for this patient. ? ?Past Medical History:  ?Diagnosis Date  ? Anxiety   ? Bipolar affective disorder ([place])   ? CAD (coronary artery disease) 2006  ? CABG w/ LIMA-LAD, RIMA-Diag, SVG-OM1-OM2, R radial-PDA  ? 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  ? Qualifier: Diagnosis of  By: Garen Grams    ? Diabetes mellitus type 2 in nonobese ([place]) 07/2016  ? Dyslipidemia   ? Headache(784.0)   ? History of pulmonary embolism  07/08/2020  ? HTN (hypertension)   ? Migraine   ? Pulmonary embolus ([place])   ? unprovoked  ? ? ?Past Surgical History:  ?Procedure Laterality Date  ? BUNIONECTOMY  08/2011  ? right foot  ? CARDIAC CATHETERIZATION  2007  ? severe native 3 v dz, all grafts patent (LIMA-LAD, RIMA-Diag, SVG-OM1-OM2, R radial-PDA)  ? [place]  ? CORONARY ARTERY BYPASS GRAFT  2006  ?  Coronary artery bypass grafting x5 with a left  internal  mammary to the left anterior descending coronary artery.  Free right  internal mammary to the diagonal coronary artery, sequential reverse  saphenous vein graft to the first and second obtuse marginal, right  artery bypass to the posterior descending coronary artery with endo-vein harvesting.  ? LEFT HEART CATH AND CORS/GRAFTS ANGIOGRAPHY N/A 05/05/2021  ? Procedure: LEFT HEART CATH AND CORS/GRAFTS ANGIOGRAPHY;  Surgeon: Leonie Man, MD;  Location: [facility];  Service: Cardiovascular;  Laterality: N/A;  ? UMBILICAL HERNIA REPAIR N/A 01/16/2021  ? Procedure: UMBILICAL  HERNIA REPAIR;  Surgeon: Dwan Bolt, MD;  Location: [facility];  Service: General;  Laterality: N/A;  ? ? ?Family History  ?Problem Relation Age of Onset  ? Lupus Mother   ? Heart attack Father   ? Diabetes Father   ? Hypertension Father   ?  Diabetes Paternal Grandmother   ? Hypertension Paternal Grandmother   ? Stroke Neg Hx   ? Kidney disease Neg Hx   ? Hyperlipidemia Neg Hx   ? Sudden death Neg Hx   ? ? ?Social History  ? ?Socioeconomic History  ? Marital status: Legally Separated  ?  Spouse name: Not on file  ? Number of children: Not on file  ? Years of education: Not on file  ? Highest education level: Not on file  ?Occupational History  ? Occupation: Insurance claims handler  ?Tobacco Use  ? Smoking status: Some Days  ?  Packs/day: 0.50  ?  Years: 25.00  ?  Pack years: 12.50  ?  Types: Cigarettes  ? Smokeless tobacco: Never  ?Vaping Use  ? Vaping Use: Never used  ?Substance and Sexual Activity  ? Alcohol use:  No  ?  Alcohol/week: 0.0 standard drinks  ? Drug use: Never  ? Sexual activity: Yes  ?  Partners: Male  ?  Comment: married  ?Other Topics Concern  ? Not on file  ?Social History Narrative  ? Regular exercise:  3 days weekly  ? Caffeine Use:  1 soda daily  ? One child biological daughter born in 93 and an adopted niece.  ? [employer] specialist  ? Married- may be divorcing.   ?   ?   ? ?Social Determinants of Health  ? ?Financial Resource Strain: Not on file  ?Food Insecurity: Not on file  ?Transportation Needs: Not on file  ?Physical Activity: Not on file  ?Stress: Not on file  ?Social Connections: Not on file  ?Intimate Partner Violence: Not on file  ? ? ?Outpatient Medications Prior to Visit  ?Medication Sig Dispense Refill  ? Accu-Chek Softclix Lancets lancets USE UP TO FOUR TIMES DAILY AS DIRECTED 200 each 12  ? ALPRAZolam (XANAX) 0.5 MG tablet TAKE 2 TABLETS BY MOUTH TWICE A DAY AS NEEDED FOR ANXIETY 60 tablet 3  ? aspirin 81 MG EC tablet Take 1 tablet (81 mg total) by mouth daily. Swallow whole. 30 tablet 12  ? atorvastatin (LIPITOR) 80 MG tablet Take 1 tablet (80 mg total) by mouth daily. 90 tablet 3  ? blood glucose meter kit and supplies KIT Dispense based on patient and insurance preference. Use up to four times daily as directed. (FOR ICD-9 250.00, 250.01). 1 each 0  ? busPIRone (BUSPAR) 30 MG tablet TAKE 1 TABLET BY MOUTH TWICE A DAY 60 tablet 1  ? clopidogrel (PLAVIX) 75 MG tablet Take 1 tablet (75 mg total) by mouth daily with breakfast. 90 tablet 3  ? dapagliflozin propanediol (FARXIGA) 10 MG TABS tablet Take 1 tablet (10 mg total) by mouth daily before breakfast. 30 tablet 11  ? divalproex (DEPAKOTE ER) 500 MG 24 hr tablet Take 3 tablets (1,500 mg total) by mouth at bedtime. 270 tablet 0  ? Glycerin-Hypromellose-PEG 400 (VISINE DRY EYE OP) Place 1 drop into both eyes 3 (three) times daily as needed (dry eyes).    ? Insulin Glargine (BASAGLAR KWIKPEN) 100 UNIT/ML INJECT 12 UNITS INTO  THE SKIN DAILY 15 mL 3  ? Insulin Pen Needle (BD PEN NEEDLE NANO 2ND GEN) 32G X 4 MM MISC USE AS DIRECTED 100 each 3  ? isosorbide mononitrate (IMDUR) 60 MG 24 hr tablet Take 1 tablet (60 mg total) by mouth daily. 90 tablet 3  ? JANUVIA 50 MG tablet TAKE 1 TABLET BY MOUTH EVERY DAY 90 tablet 1  ? lisinopril (  ZESTRIL) 10 MG tablet Take 1 tablet (10 mg total) by mouth daily. 30 tablet 11  ? nitroGLYCERIN (NITROSTAT) 0.4 MG SL tablet PLACE 1 TABLET UNDER THE TONGUE EVERY 5 MINUTES AS NEEDED FOR CHOEST PAIN 25 tablet 1  ? OLANZapine (ZYPREXA) 20 MG tablet TAKE 1 TABLET BY MOUTH EVERYDAY AT BEDTIME 90 tablet 0  ? PARoxetine (PAXIL) 20 MG tablet TAKE 2 TABLETS BY MOUTH EVERY DAY 180 tablet 1  ? XIIDRA 5 % SOLN Place 1 drop into both eyes in the morning and at bedtime.    ? metoprolol succinate (TOPROL-XL) 25 MG 24 hr tablet Take 1.5 tablets (37.5 mg total) by mouth daily. 30 tablet 11  ? oxyCODONE (OXY IR/ROXICODONE) 5 MG immediate release tablet Take 1 tablet (5 mg total) by mouth every 6 (six) hours as needed for severe pain. (Patient not taking: Reported on 06/23/2021) 15 tablet 0  ? ?No facility-administered medications prior to visit.  ? ? ?Allergies  ?Allergen Reactions  ? Metformin And Related Nausea And Vomiting  ? ? ?Review of Systems  ?Musculoskeletal:  Positive for myalgias (Lower Extremities).  ? ?   ?Objective:  ?  ?Physical Exam ?Constitutional:   ?   General: She is not in acute distress. ?   Appearance: Normal appearance. She is not ill-appearing.  ?HENT:  ?   Head: Normocephalic and atraumatic.  ?   Right Ear: External ear normal.  ?   Left Ear: External ear normal.  ?Eyes:  ?   Extraocular Movements: Extraocular movements intact.  ?   Pupils: Pupils are equal, round, and reactive to light.  ?Cardiovascular:  ?   Rate and Rhythm: Normal rate and regular rhythm.  ?   Heart sounds: Normal heart sounds. No murmur heard. ?  No gallop.  ?Pulmonary:  ?   Effort: Pulmonary effort is normal. No respiratory  distress.  ?   Breath sounds: Normal breath sounds. No wheezing or rales.  ?Musculoskeletal:     ?   General: No swelling.  ?Skin: ?   General: Skin is warm and dry.  ?Neurological:  ?   Mental Status: She is a

## 2021-08-11 NOTE — Assessment & Plan Note (Signed)
Saw vascular surgeon. Plan is medical management/smoking cessation, follow up with vascular in 6 months.  ?

## 2021-08-12 ENCOUNTER — Encounter: Payer: Self-pay | Admitting: Family

## 2021-08-12 ENCOUNTER — Other Ambulatory Visit: Payer: Self-pay | Admitting: Family

## 2021-08-12 LAB — BASIC METABOLIC PANEL
BUN: 19 mg/dL (ref 6–23)
CO2: 26 mEq/L (ref 19–32)
Calcium: 9.8 mg/dL (ref 8.4–10.5)
Chloride: 103 mEq/L (ref 96–112)
Creatinine, Ser: 1.19 mg/dL (ref 0.40–1.20)
GFR: 50.33 mL/min — ABNORMAL LOW (ref >60.00)
Glucose, Bld: 87 mg/dL (ref 70–99)
Potassium: 4.5 mEq/L (ref 3.5–5.1)
Sodium: 137 mEq/L (ref 135–145)

## 2021-08-12 LAB — HEMOGLOBIN A1C: Hgb A1c MFr Bld: 7 % — ABNORMAL HIGH (ref 4.6–6.5)

## 2021-08-19 NOTE — Progress Notes (Signed)
?  ?Cardiology Office Note ? ? ?Date:  08/20/2021  ? ?ID:  Bailey Greene, DOB 04/22/1961, MRN [id number] ? ?PCP:  [name], NP  ?Cardiologist:   Minus Breeding, MD ? ? ?Chief Complaint  ?Patient presents with  ? Chest Pain  ? ? ?  ?History of Present Illness: ?Bailey Greene is a 61 y.o. female who presents for followup of a history of coronary artery disease status post CABG x5 in 2006 followup cath in 2007 showed patent grafts and improved LV function EF 65%. She had a normal stress test in May 2012 and again in July 2016 and again in March of 2018.   She had a low risk perfusion study in Feb 2019.   She was in the hospital  05/05/2021 with NSTEMI with troponin peaking at 3359.  Cardiac catheterization showed severe multivessel native CAD, 3 out of 5 grafts patent (LIMA-LAD, free RIMA-diagonal, left radial-RPDA) culprit lesion felt to be 100% occluded SVG-OM-OM2 with patent OM1-OM2 limb.  She was recommended for medical therapy.   ? ?She continues to get some arm discomfort.  This is unchanged since her hospitalization.  She has had to take a couple of nitroglycerin since she was seen.  She is again emotional in the office because of her health concerns.  She is actually taking some time off of work.  She is down to a very few cigarettes a week.  She denies any resting symptoms and her symptoms typically happen with exertion.  She is not having any palpitations, presyncope or syncope.  She is not having any PND or orthopnea.  She is doing some walking for exercise. ? ?  ?  ?Lab Results  ?Component Value Date  ? TSH 0.79 10/29/2020  ? ? ? ? ?Past Medical History:  ?Diagnosis Date  ? Anxiety   ? Bipolar affective disorder ([facility])   ? CAD (coronary artery disease) 2006  ? CABG w/ LIMA-LAD, RIMA-Diag, SVG-OM1-OM2, R radial-PDA  ? 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  ? Qualifier: Diagnosis of  By: Garen Grams    ? Diabetes mellitus type 2 in nonobese ([place]) 07/2016  ? Dyslipidemia   ? Headache(784.0)   ?  History of pulmonary embolism 07/08/2020  ? HTN (hypertension)   ? Migraine   ? Pulmonary embolus ([place])   ? unprovoked  ? ? ?Past Surgical History:  ?Procedure Laterality Date  ? BUNIONECTOMY  08/2011  ? right foot  ? CARDIAC CATHETERIZATION  2007  ? severe native 3 v dz, all grafts patent (LIMA-LAD, RIMA-Diag, SVG-OM1-OM2, R radial-PDA)  ? [place]  ? CORONARY ARTERY BYPASS GRAFT  2006  ?  Coronary artery bypass grafting x5 with a left  internal  mammary to the left anterior descending coronary artery.  Free right  internal mammary to the diagonal coronary artery, sequential reverse  saphenous vein graft to the first and second obtuse marginal, right  artery bypass to the posterior descending coronary artery with endo-vein harvesting.  ? LEFT HEART CATH AND CORS/GRAFTS ANGIOGRAPHY N/A 05/05/2021  ? Procedure: LEFT HEART CATH AND CORS/GRAFTS ANGIOGRAPHY;  Surgeon: Leonie Man, MD;  Location: [facility];  Service: Cardiovascular;  Laterality: N/A;  ? UMBILICAL HERNIA REPAIR N/A 01/16/2021  ? Procedure: UMBILICAL  HERNIA REPAIR;  Surgeon: Dwan Bolt, MD;  Location: [facility];  Service: General;  Laterality: N/A;  ? ? ? ?Current Outpatient Medications  ?Medication Sig Dispense Refill  ? ACCU-CHEK GUIDE test strip USE UP TO FOUR TIMES DAILY  AS DIRECTED 400 strip 6  ? Accu-Chek Softclix Lancets lancets USE UP TO FOUR TIMES DAILY AS DIRECTED 200 each 12  ? ALPRAZolam (XANAX) 0.5 MG tablet TAKE 2 TABLETS BY MOUTH TWICE A DAY AS NEEDED FOR ANXIETY 60 tablet 3  ? aspirin 81 MG EC tablet Take 1 tablet (81 mg total) by mouth daily. Swallow whole. 30 tablet 12  ? atorvastatin (LIPITOR) 80 MG tablet Take 1 tablet (80 mg total) by mouth daily. 90 tablet 3  ? blood glucose meter kit and supplies KIT Dispense based on patient and insurance preference. Use up to four times daily as directed. (FOR ICD-9 250.00, 250.01). 1 each 0  ? busPIRone (BUSPAR) 30 MG tablet TAKE 1 TABLET BY MOUTH TWICE A DAY 60 tablet 1   ? clopidogrel (PLAVIX) 75 MG tablet Take 1 tablet (75 mg total) by mouth daily with breakfast. 90 tablet 3  ? dapagliflozin propanediol (FARXIGA) 10 MG TABS tablet Take 1 tablet (10 mg total) by mouth daily before breakfast. 30 tablet 11  ? divalproex (DEPAKOTE ER) 500 MG 24 hr tablet Take 3 tablets (1,500 mg total) by mouth at bedtime. 270 tablet 0  ? Glycerin-Hypromellose-PEG 400 (VISINE DRY EYE OP) Place 1 drop into both eyes 3 (three) times daily as needed (dry eyes).    ? Insulin Glargine (BASAGLAR KWIKPEN) 100 UNIT/ML INJECT 12 UNITS INTO THE SKIN DAILY 15 mL 3  ? Insulin Pen Needle (BD PEN NEEDLE NANO 2ND GEN) 32G X 4 MM MISC USE AS DIRECTED 100 each 3  ? JANUVIA 50 MG tablet TAKE 1 TABLET BY MOUTH EVERY DAY 90 tablet 1  ? lisinopril (ZESTRIL) 10 MG tablet Take 1 tablet (10 mg total) by mouth daily. 30 tablet 11  ? metoprolol succinate (TOPROL-XL) 25 MG 24 hr tablet Take 1 tablet (25 mg total) by mouth daily. 30 tablet 11  ? nitroGLYCERIN (NITROSTAT) 0.4 MG SL tablet PLACE 1 TABLET UNDER THE TONGUE EVERY 5 MINUTES AS NEEDED FOR CHOEST PAIN 25 tablet 1  ? OLANZapine (ZYPREXA) 20 MG tablet TAKE 1 TABLET BY MOUTH EVERYDAY AT BEDTIME 90 tablet 0  ? PARoxetine (PAXIL) 20 MG tablet TAKE 2 TABLETS BY MOUTH EVERY DAY 180 tablet 1  ? QUEtiapine (SEROQUEL) 25 MG tablet     ? simvastatin (ZOCOR) 80 MG tablet TAKE 1 TABLET BY MOUTH EVERYDAY AT BEDTIME    ? XIIDRA 5 % SOLN Place 1 drop into both eyes in the morning and at bedtime.    ? isosorbide mononitrate (IMDUR) 120 MG 24 hr tablet Take 1 tablet (120 mg total) by mouth daily. 90 tablet 3  ? ?No current facility-administered medications for this visit.  ? ? ?Allergies:   Metformin and related  ? ? ?ROS:  Please see the history of present illness.   Otherwise, review of systems are positive for none.   All other systems are reviewed and negative.  ? ? ?PHYSICAL EXAM: ?VS:  BP 106/62   Pulse 83   Ht _0  (1.651 m)   Wt 133 lb (60.3 kg)   LMP 11/28/2009   SpO2 99%    BMI 22.13 kg/m?  , BMI Body mass index is 22.13 kg/m?. ?GENERAL:  Well appearing ?NECK:  No jugular venous distention, waveform within normal limits, carotid upstroke brisk and symmetric, no bruits, no thyromegaly ?LUNGS:  Clear to auscultation bilaterally ?CHEST:  Well healed sternotomy scar. ?HEART:  PMI not displaced or sustained,S1 and S2 within normal limits, no S3, no  S4, no clicks, no rubs, no murmurs ?ABD:  Flat, positive bowel sounds normal in frequency in pitch, no bruits, no rebound, no guarding, no midline pulsatile mass, no hepatomegaly, no splenomegaly ?EXT:  2 plus pulses upper, decreased dorsalis pedis and posterior tibialis bilateral, no edema, no cyanosis no clubbing ? ? ?EKG:  EKG is not ordered today. ? ? ?Recent Labs: ?10/29/2020: ALT 9; TSH 0.79 ?05/07/2021: Hemoglobin 12.8; Platelets 199 ?08/11/2021: BUN 19; Creatinine, Ser 1.19; Potassium 4.5; Sodium 137  ? ? ?Lipid Panel ?   ?Component Value Date/Time  ? CHOL 137 05/06/2021 0351  ? CHOL 139 07/17/2020 1009  ? TRIG 113 05/06/2021 0351  ? HDL 49 05/06/2021 0351  ? HDL 58 07/17/2020 1009  ? CHOLHDL 2.8 05/06/2021 0351  ? VLDL 23 05/06/2021 0351  ? [place] 65 05/06/2021 0351  ? [place] 65 07/17/2020 1009  ? LDLDIRECT 145.2 07/18/2008 0821  ? ?  ? ?Wt Readings from Last 3 Encounters:  ?08/20/21 133 lb (60.3 kg)  ?08/11/21 134 lb (60.8 kg)  ?06/23/21 137 lb 8 oz (62.4 kg)  ?  ? ? ?Other studies Reviewed: ?Additional studies/ records that were reviewed today include: Labs ?Review of the above records demonstrates:    Please see elsewhere in the note.   ? ? ?ASSESSMENT AND PLAN: ? ?CAD:       She is having some exertional angina.  I am going to increase her isosorbide to 120 mg daily.  I did look at her cath films during this visit.  She has a lot of diffuse disease and she certainly needs aggressive secondary risk reduction.   ? ?DYSLIPIDEMIA:    She is on high-dose statin.  LDL was 65 with an HDL of 49.  No change in therapy. ? ?HTN:    For blood  pressure is at target.  No change in therapy.  ? ?DM:   A1C was down from 13.8.  However, it is up a little bit from its low and he is currently 7.0.  I will defer to [name], NP 13.8.  ? ?TOBACCO:   I

## 2021-08-20 ENCOUNTER — Ambulatory Visit: Payer: 59 | Admitting: Cardiology

## 2021-08-20 ENCOUNTER — Other Ambulatory Visit: Payer: Self-pay

## 2021-08-20 ENCOUNTER — Encounter: Payer: Self-pay | Admitting: Cardiology

## 2021-08-20 VITALS — BP 106/62 | HR 83 | Ht 65.0 in | Wt 133.0 lb

## 2021-08-20 DIAGNOSIS — I10 Essential (primary) hypertension: Secondary | ICD-10-CM | POA: Diagnosis not present

## 2021-08-20 DIAGNOSIS — Z720 Tobacco use: Secondary | ICD-10-CM

## 2021-08-20 DIAGNOSIS — E118 Type 2 diabetes mellitus with unspecified complications: Secondary | ICD-10-CM | POA: Diagnosis not present

## 2021-08-20 DIAGNOSIS — I25119 Atherosclerotic heart disease of native coronary artery with unspecified angina pectoris: Secondary | ICD-10-CM

## 2021-08-20 DIAGNOSIS — E785 Hyperlipidemia, unspecified: Secondary | ICD-10-CM | POA: Diagnosis not present

## 2021-08-20 MED ORDER — ISOSORBIDE MONONITRATE ER 120 MG PO TB24: 120.0000 mg | ORAL_TABLET | Freq: Every day | ORAL | 3 refills | Status: DC

## 2021-08-20 NOTE — Patient Instructions (Signed)
Medication Instructions:  ?INCREASE isosorbide (Imdur) to 120 mg daily ? ?*If you need a refill on your cardiac medications before your next appointment, please call your pharmacy* ? ?Follow-Up: ?At [facility], you and your health needs are our priority.  As part of our continuing mission to provide you with exceptional heart care, we have created designated Provider Care Teams.  These Care Teams include your primary Cardiologist (physician) and Advanced Practice Providers (APPs -  Physician Assistants and Nurse Practitioners) who all work together to provide you with the care you need, when you need it. ? ?We recommend signing up for the patient portal called "MyChart".  Sign up information is provided on this After Visit Summary.  MyChart is used to connect with patients for Virtual Visits (Telemedicine).  Patients are able to view lab/test results, encounter notes, upcoming appointments, etc.  Non-urgent messages can be sent to your provider as well.   ?To learn more about what you can do with MyChart, go to [email or web address].   ? ?Your next appointment:   ?3 month(s) with PA or NP ? ? ? ?

## 2021-08-21 ENCOUNTER — Encounter: Payer: Self-pay | Admitting: Podiatrist

## 2021-08-21 ENCOUNTER — Ambulatory Visit: Payer: 59 | Admitting: Podiatrist

## 2021-08-21 DIAGNOSIS — L600 Ingrowing nail: Secondary | ICD-10-CM | POA: Diagnosis not present

## 2021-08-21 MED ORDER — MUPIROCIN 2 % EX OINT: 1.0000 "application " | TOPICAL_OINTMENT | Freq: Two times a day (BID) | CUTANEOUS | 2 refills | Status: DC

## 2021-08-21 NOTE — Patient Instructions (Signed)
Soak Instructions ? ? ? ?THE DAY AFTER THE PROCEDURE ? ?Place 1/4 cup of epsom salts in a quart of warm tap water.  Submerge your foot or feet with outer bandage intact for the initial soak; this will allow the bandage to become moist and wet for easy lift off.  Once you remove your bandage, continue to soak in the solution for 20 minutes.  This soak should be done twice a day.  Next, remove your foot or feet from solution, blot dry the affected area and cover.  Apply polysporin or neosporin or mupirocin ointment to the toe (I have called in the mupirocin ointment for you).   You may use a band aid large enough to cover the area or use gauze and tape.   ? ? ?IF YOUR SKIN BECOMES IRRITATED WHILE USING THESE INSTRUCTIONS, IT IS OKAY TO SWITCH TO  antibacterial soap pump soap (Dial)  and water to keep the toe clean instead of soaking in epsom salts.  ? ?

## 2021-08-21 NOTE — Progress Notes (Signed)
?  ? ?HPI: Patient is 61 y.o. female who presents today for an ingrown toenail on the right great toe.  The great toe is underlapping the second toe and causing pressure and pain on the lateral and medial nail border of right great toenail.  He tried shoe gear changes which have failed to relieve the discomfort she would like to have the area fixed. ? ?Patient Active Problem List  ? Diagnosis Date Noted  ? PVD (peripheral vascular disease) ([place]) 05/12/2021  ? NSTEMI (non-ST elevated myocardial infarction) ([place]) 05/05/2021  ? Periumbilical hernia 82/99/3716  ? History of pulmonary embolism 07/08/2020  ? HTN (hypertension)   ? Bipolar affective disorder ([place])   ? Tobacco abuse   ? Hyperlipidemia with target LDL less than 70   ? Diabetes mellitus type 2 in nonobese ([place]) 07/2016  ? Chest pain with moderate risk for cardiac etiology 12/27/2014  ? Hx of CABG 12/27/2014  ? Carpal tunnel syndrome 05/03/2013  ? Loss of weight 08/23/2012  ? General medical examination 07/06/2011  ? Goiter 09/23/2010  ? Anxiety state 01/31/2007  ? 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  ? Essential hypertension 01/31/2007  ? Coronary artery disease involving native coronary artery of native heart with angina pectoris ([place]) 01/31/2007  ? CAD (coronary artery disease) 2006  ? ? ?Current Outpatient Medications on File Prior to Visit  ?Medication Sig Dispense Refill  ? ACCU-CHEK GUIDE test strip USE UP TO FOUR TIMES DAILY AS DIRECTED 400 strip 6  ? Accu-Chek Softclix Lancets lancets USE UP TO FOUR TIMES DAILY AS DIRECTED 200 each 12  ? ALPRAZolam (XANAX) 0.5 MG tablet TAKE 2 TABLETS BY MOUTH TWICE A DAY AS NEEDED FOR ANXIETY 60 tablet 3  ? aspirin 81 MG EC tablet Take 1 tablet (81 mg total) by mouth daily. Swallow whole. 30 tablet 12  ? atorvastatin (LIPITOR) 80 MG tablet Take 1 tablet (80 mg total) by mouth daily. 90 tablet 3  ? blood glucose meter kit and supplies KIT Dispense based on patient and insurance preference. Use up to four times daily as  directed. (FOR ICD-9 250.00, 250.01). 1 each 0  ? busPIRone (BUSPAR) 30 MG tablet TAKE 1 TABLET BY MOUTH TWICE A DAY 60 tablet 1  ? clopidogrel (PLAVIX) 75 MG tablet Take 1 tablet (75 mg total) by mouth daily with breakfast. 90 tablet 3  ? dapagliflozin propanediol (FARXIGA) 10 MG TABS tablet Take 1 tablet (10 mg total) by mouth daily before breakfast. 30 tablet 11  ? divalproex (DEPAKOTE ER) 500 MG 24 hr tablet Take 3 tablets (1,500 mg total) by mouth at bedtime. 270 tablet 0  ? Glycerin-Hypromellose-PEG 400 (VISINE DRY EYE OP) Place 1 drop into both eyes 3 (three) times daily as needed (dry eyes).    ? Insulin Glargine (BASAGLAR KWIKPEN) 100 UNIT/ML INJECT 12 UNITS INTO THE SKIN DAILY 15 mL 3  ? Insulin Pen Needle (BD PEN NEEDLE NANO 2ND GEN) 32G X 4 MM MISC USE AS DIRECTED 100 each 3  ? isosorbide mononitrate (IMDUR) 120 MG 24 hr tablet Take 1 tablet (120 mg total) by mouth daily. 90 tablet 3  ? JANUVIA 50 MG tablet TAKE 1 TABLET BY MOUTH EVERY DAY 90 tablet 1  ? lisinopril (ZESTRIL) 10 MG tablet Take 1 tablet (10 mg total) by mouth daily. 30 tablet 11  ? metoprolol succinate (TOPROL-XL) 25 MG 24 hr tablet Take 1 tablet (25 mg total) by mouth daily. 30 tablet 11  ? nitroGLYCERIN (NITROSTAT) 0.4 MG SL  tablet PLACE 1 TABLET UNDER THE TONGUE EVERY 5 MINUTES AS NEEDED FOR CHOEST PAIN 25 tablet 1  ? OLANZapine (ZYPREXA) 20 MG tablet TAKE 1 TABLET BY MOUTH EVERYDAY AT BEDTIME 90 tablet 0  ? PARoxetine (PAXIL) 20 MG tablet TAKE 2 TABLETS BY MOUTH EVERY DAY 180 tablet 1  ? QUEtiapine (SEROQUEL) 25 MG tablet     ? simvastatin (ZOCOR) 80 MG tablet TAKE 1 TABLET BY MOUTH EVERYDAY AT BEDTIME    ? XIIDRA 5 % SOLN Place 1 drop into both eyes in the morning and at bedtime.    ? ?No current facility-administered medications on file prior to visit.  ? ? ?Allergies  ?Allergen Reactions  ? Metformin And Related Nausea And Vomiting  ? ? ?Review of Systems ?No fevers, chills, nausea, muscle aches, no difficulty breathing, no calf  pain, no chest pain or shortness of breath. ? ? ?Physical Exam ? ?GENERAL APPEARANCE: Alert, conversant. Appropriately groomed. No acute distress.  ? ?VASCULAR: Pedal pulses palpable DP and PT bilateral.  Capillary refill time is immediate to all digits,  Proximal to distal cooling it warm to warm.  Digital perfusion adequate.  ? ?NEUROLOGIC: sensation is intact to 5.07 monofilament at 5/5 sites bilateral.  Light touch is intact bilateral, vibratory sensation intact bilateral ? ?MUSCULOSKELETAL: acceptable muscle strength, tone and stability bilateral.  No gross boney pedal deformities noted.  No pain, crepitus or limitation noted with foot and ankle range of motion bilateral.  ? ?DERMATOLOGIC: skin is warm, supple, and dry.  Color, texture, and turgor of skin within normal limits.  No open wounds are noted.  Hallux nail is painful and symptomatic with ingrown nail deformity on the medial and lateral nail border.  pincer nail deformity is noted with inflammation along the nail border is noted no pus or purulence is seen. ? ?Assessment  ? ?  ICD-10-CM   ?1. Ingrown right big toenail  L60.0   ?  ? ? ? ?Plan ? ?Discussed treatment options and alternatives and discussed a permanent phenol matrixectomy as the nail borders are severely incurvated.  The patient wishes to proceed with this procedure.  I prepped the skin with alcohol and infiltrated lidocaine Marcaine mixed in a digital block fashion.  Once anesthetized the toe was then prepped with Betadine and exsanguinated and the medial and lateral nail borders were then sharply removed and phenol was applied to the matrix tissue followed by an alcohol wash and antibiotic ointment and a dry sterile and compressive dressing was applied.  The patient was told to keep the dressing intact for 24 hours and then she may remove it with soaking.  She was also given instructions for soaks and aftercare.  If she notices any redness or swelling or signs of infection she will call  and I will see her back in a week to 10 days for recheck. ?

## 2021-08-22 ENCOUNTER — Other Ambulatory Visit: Payer: Self-pay | Admitting: Sports Medicine

## 2021-08-22 MED ORDER — HYDROCODONE-ACETAMINOPHEN 5-325 MG PO TABS: 1.0000 | ORAL_TABLET | Freq: Four times a day (QID) | ORAL | 0 refills | Status: AC | PRN

## 2021-08-22 MED ORDER — HYDROCODONE-ACETAMINOPHEN 5-325 MG PO TABS: 1.0000 | ORAL_TABLET | Freq: Four times a day (QID) | ORAL | 0 refills | Status: DC | PRN

## 2021-08-22 NOTE — Progress Notes (Signed)
Rx changed to [facility]  ?

## 2021-08-22 NOTE — Progress Notes (Signed)
Patient called answering service stated that she had a toenail procedure done on yesterday and is in severe pain and cannot sleep and states that her pain currently is 9 out of 10 she has tried elevation doing her soaking and redressing with antibiotic cream and it still hurts states that she has also tried ibuprofen and still has very severe pain.  Patient reports that she is walking on the side of her foot because it hurts at her toe.  Patient denies nausea vomiting fever or chills and states that when she change the bandage there is no signs of infection no significant redness warmth or purulent drainage patient states that there is only a small amount of bleeding at the area.  I advised patient to continue with soaking twice daily and use of mupirocin antibiotic ointment to the area and a loose Band-Aid.  For this time only I did prescribe patient hydrocodone to take for severe pain after toe procedure. ? ? ?

## 2021-08-24 ENCOUNTER — Telehealth: Payer: Self-pay | Admitting: Cardiology

## 2021-08-24 NOTE — Telephone Encounter (Signed)
08/24/21  ? ?Office received Disability forms from [place]. Forms put in Dr.Hochrein box.    ?  * pt will need to complete Release, Billing Form, $29 (Cash, Check, Money Order) payment. * ?

## 2021-08-25 ENCOUNTER — Encounter: Payer: Self-pay | Admitting: Family

## 2021-08-25 NOTE — Telephone Encounter (Signed)
Returned patient call informed patient when she was able, to stop by [facility] office to sign Release, Billing form, and $29 fee (cash, check, money order).  ?

## 2021-08-26 DIAGNOSIS — Z0279 Encounter for issue of other medical certificate: Secondary | ICD-10-CM

## 2021-08-26 NOTE — Telephone Encounter (Signed)
08/26/21 ? ?RELEASE, BILLING FORM, PAYMENT COMPLETE.   ?Once forms are completed mail copy to patient, and fax to employer.  ?

## 2021-08-28 ENCOUNTER — Telehealth: Payer: Self-pay | Admitting: Family

## 2021-08-28 NOTE — Telephone Encounter (Signed)
[place] is calling back to know why she is unable to go back to work as of March 17th. She can be reached at [phone]. Please advise.  ?

## 2021-08-28 NOTE — Telephone Encounter (Signed)
Called but no answer, lvm for a call back 

## 2021-08-28 NOTE — Telephone Encounter (Signed)
Dates corrected verified with patient as 08-14-21 till current. Changed signed by Dr. Drue Novel ?

## 2021-08-28 NOTE — Telephone Encounter (Signed)
[employer] states that the date of incapacity does not much when pt left work. She left work 3/17 and this will need to be changed on the paperwork so fmla can be processed. Please advise.  ?

## 2021-08-29 ENCOUNTER — Other Ambulatory Visit: Payer: Self-pay | Admitting: Psychiatry

## 2021-08-29 DIAGNOSIS — F411 Generalized anxiety disorder: Secondary | ICD-10-CM

## 2021-08-30 ENCOUNTER — Other Ambulatory Visit: Payer: Self-pay | Admitting: Psychiatry

## 2021-08-30 DIAGNOSIS — F4001 Agoraphobia with panic disorder: Secondary | ICD-10-CM

## 2021-08-30 DIAGNOSIS — F3162 Bipolar disorder, current episode mixed, moderate: Secondary | ICD-10-CM

## 2021-08-30 DIAGNOSIS — F411 Generalized anxiety disorder: Secondary | ICD-10-CM

## 2021-08-31 ENCOUNTER — Ambulatory Visit: Payer: 59 | Admitting: Podiatrist

## 2021-09-01 ENCOUNTER — Other Ambulatory Visit: Payer: Self-pay | Admitting: Psychiatry

## 2021-09-01 DIAGNOSIS — F4001 Agoraphobia with panic disorder: Secondary | ICD-10-CM

## 2021-09-01 DIAGNOSIS — F411 Generalized anxiety disorder: Secondary | ICD-10-CM

## 2021-09-02 ENCOUNTER — Encounter: Payer: Self-pay | Admitting: Family

## 2021-09-02 NOTE — Telephone Encounter (Signed)
Patient was calling to check the status of her FMLA forms  ?

## 2021-09-02 NOTE — Telephone Encounter (Signed)
09/02/21  ? ?Waiting for provider to complete forms and return to front office to send to employer and patient.  ?

## 2021-09-03 ENCOUNTER — Encounter: Payer: Self-pay | Admitting: Cardiology

## 2021-09-04 ENCOUNTER — Telehealth: Payer: Self-pay

## 2021-09-04 NOTE — Telephone Encounter (Signed)
Returned patient's call. She had questions about FMLA form. I explained that Dr. Percival Spanish is not in the office today, that she can call back on Monday to follow up. She said she would. ?

## 2021-09-04 NOTE — Telephone Encounter (Signed)
Patient called again to check the status of her FMLA Paperwork.  ?

## 2021-09-07 ENCOUNTER — Telehealth: Payer: Self-pay | Admitting: Cardiology

## 2021-09-07 NOTE — Telephone Encounter (Signed)
Spoke with patient about STD paperwork. This needs to be completed this week. Spoke with Stanton Kidney RN who needs to speak with patient about the paperwork and will call her later today. Patient is OK with this plan.  ?

## 2021-09-07 NOTE — Telephone Encounter (Signed)
Spoke with pt, short term disability forms filled out and to be faxed back. ?

## 2021-09-08 MED ORDER — FREESTYLE LIBRE READER DEVI: 0 refills | Status: DC

## 2021-09-08 MED ORDER — FREESTYLE LIBRE 2 SENSOR MISC: 4 refills | Status: DC

## 2021-09-08 NOTE — Telephone Encounter (Signed)
Forms received from provider, faxed and copy mailed to patient (per patient request). Patient made aware. ?

## 2021-09-16 NOTE — Telephone Encounter (Signed)
Patient advised we have not receive any forms with her name yet ?

## 2021-09-16 NOTE — Telephone Encounter (Signed)
Patient is following up regarding short-term disability paperwork. She states the paperwork is missing her office visit notes.  ?

## 2021-09-16 NOTE — Telephone Encounter (Signed)
Spoke with pt, last office note faxed to [place]ette @ [phone] ?Case # [id number] ?

## 2021-09-18 ENCOUNTER — Other Ambulatory Visit: Payer: Self-pay

## 2021-09-18 DIAGNOSIS — E119 Type 2 diabetes mellitus without complications: Secondary | ICD-10-CM

## 2021-09-18 MED ORDER — DEXCOM G6 RECEIVER DEVI: 1 refills | Status: DC

## 2021-09-18 MED ORDER — DEXCOM G6 SENSOR MISC: 1 refills | Status: DC

## 2021-09-18 MED ORDER — DEXCOM G6 TRANSMITTER MISC: 1 refills | Status: DC

## 2021-09-23 ENCOUNTER — Other Ambulatory Visit: Payer: Self-pay | Admitting: Psychiatry

## 2021-09-23 DIAGNOSIS — F411 Generalized anxiety disorder: Secondary | ICD-10-CM

## 2021-09-26 ENCOUNTER — Other Ambulatory Visit: Payer: Self-pay | Admitting: Psychiatry

## 2021-09-26 DIAGNOSIS — F3162 Bipolar disorder, current episode mixed, moderate: Secondary | ICD-10-CM

## 2021-09-26 DIAGNOSIS — F411 Generalized anxiety disorder: Secondary | ICD-10-CM

## 2021-09-26 DIAGNOSIS — F4001 Agoraphobia with panic disorder: Secondary | ICD-10-CM

## 2021-09-27 NOTE — Telephone Encounter (Signed)
Has an appt with Dr. Cottle tomorrow 

## 2021-09-28 ENCOUNTER — Encounter: Payer: 59 | Admitting: Psychiatry

## 2021-09-28 NOTE — Progress Notes (Signed)
This encounter was created in error - please disregard. ?Pt had to leave for work ?

## 2021-09-30 NOTE — Progress Notes (Signed)
?  ?Cardiology Office Note ? ? ?Date:  10/02/2021  ? ?ID:  Bailey Greene, DOB 1961/03/15, MRN [id number] ? ?PCP:  [name], NP  ?Cardiologist:   Minus Breeding, MD ? ? ?Chief Complaint  ?Patient presents with  ? Chest Pain  ? ? ?  ?History of Present Illness: ?Bailey Greene is a 61 y.o. female who presents for followup of a history of coronary artery disease status post CABG x5 in 2006 followup cath in 2007 showed patent grafts and improved LV function EF 65%. She had a normal stress test in May 2012 and again in July 2016 and again in March of 2018.   She had a low risk perfusion study in Feb 2019.   She was in the hospital  05/05/2021 with NSTEMI with troponin peaking at 3359.  Cardiac catheterization showed severe multivessel native CAD, 3 out of 5 grafts patent (LIMA-LAD, free RIMA-diagonal, left radial-RPDA) culprit lesion felt to be 100% occluded SVG-OM-OM2 with patent OM1-OM2 limb.  She was recommended for medical therapy.   ? ?Since I last saw her she is doing better on 120 mg of Imdur which I increased last time.  She has continued to get some chest discomfort with emotional stress at work or if she walks 1/2-hour.  She is not having any resting complaints.  She thinks she has had to take about 2 nitroglycerin since I last saw her.  She has coronary disease as below and we are managing this medically for small vessel and branch vessel disease. ?  ?  ?Lab Results  ?Component Value Date  ? TSH 0.79 10/29/2020  ? ? ? ? ?Past Medical History:  ?Diagnosis Date  ? Anxiety   ? Bipolar affective disorder ([place])   ? CAD (coronary artery disease) 2006  ? CABG w/ LIMA-LAD, RIMA-Diag, SVG-OM1-OM2, R radial-PDA  ? 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  ? Qualifier: Diagnosis of  By: Garen Grams    ? Diabetes mellitus type 2 in nonobese ([place]) 07/2016  ? Dyslipidemia   ? Headache(784.0)   ? History of pulmonary embolism 07/08/2020  ? HTN (hypertension)   ? Migraine   ? NSTEMI (non-ST elevated myocardial  infarction) ([place])   ? PAD (peripheral artery disease) ([place])   ? Pulmonary embolus ([place])   ? unprovoked  ? ? ?Past Surgical History:  ?Procedure Laterality Date  ? BUNIONECTOMY  08/2011  ? right foot  ? CARDIAC CATHETERIZATION  2007  ? severe native 3 v dz, all grafts patent (LIMA-LAD, RIMA-Diag, SVG-OM1-OM2, R radial-PDA)  ? [place]  ? CORONARY ARTERY BYPASS GRAFT  2006  ?  Coronary artery bypass grafting x5 with a left  internal  mammary to the left anterior descending coronary artery.  Free right  internal mammary to the diagonal coronary artery, sequential reverse  saphenous vein graft to the first and second obtuse marginal, right  artery bypass to the posterior descending coronary artery with endo-vein harvesting.  ? LEFT HEART CATH AND CORS/GRAFTS ANGIOGRAPHY N/A 05/05/2021  ? Procedure: LEFT HEART CATH AND CORS/GRAFTS ANGIOGRAPHY;  Surgeon: Leonie Man, MD;  Location: [facility];  Service: Cardiovascular;  Laterality: N/A;  ? UMBILICAL HERNIA REPAIR N/A 01/16/2021  ? Procedure: UMBILICAL  HERNIA REPAIR;  Surgeon: Dwan Bolt, MD;  Location: [facility];  Service: General;  Laterality: N/A;  ? ? ? ?Current Outpatient Medications  ?Medication Sig Dispense Refill  ? ranolazine (RANEXA) 500 MG 12 hr tablet Take 1 tablet (500 mg total) by  mouth 2 (two) times daily. 180 tablet 3  ? ACCU-CHEK GUIDE test strip USE UP TO FOUR TIMES DAILY AS DIRECTED 400 strip 6  ? Accu-Chek Softclix Lancets lancets USE UP TO FOUR TIMES DAILY AS DIRECTED 200 each 12  ? ALPRAZolam (XANAX) 0.5 MG tablet TAKE 2 TABLETS BY MOUTH TWICE A DAY AS NEEDED FOR ANXIETY 60 tablet 3  ? aspirin 81 MG EC tablet Take 1 tablet (81 mg total) by mouth daily. Swallow whole. 30 tablet 12  ? atorvastatin (LIPITOR) 80 MG tablet Take 1 tablet (80 mg total) by mouth daily. 90 tablet 3  ? blood glucose meter kit and supplies KIT Dispense based on patient and insurance preference. Use up to four times daily as directed. (FOR ICD-9 250.00,  250.01). 1 each 0  ? busPIRone (BUSPAR) 30 MG tablet TAKE 1 TABLET BY MOUTH TWICE A DAY 60 tablet 0  ? clopidogrel (PLAVIX) 75 MG tablet Take 1 tablet (75 mg total) by mouth daily with breakfast. 90 tablet 3  ? Continuous Blood Gluc Receiver (DEXCOM G6 RECEIVER) DEVI Use as directed 1 each 1  ? Continuous Blood Gluc Sensor (DEXCOM G6 SENSOR) MISC Use as directed 1 each 1  ? Continuous Blood Gluc Transmit (DEXCOM G6 TRANSMITTER) MISC Use as directed 1 each 1  ? dapagliflozin propanediol (FARXIGA) 10 MG TABS tablet Take 1 tablet (10 mg total) by mouth daily before breakfast. 30 tablet 11  ? divalproex (DEPAKOTE ER) 500 MG 24 hr tablet TAKE 3 TABLETS (1,500 MG TOTAL) BY MOUTH AT BEDTIME. 90 tablet 0  ? Glycerin-Hypromellose-PEG 400 (VISINE DRY EYE OP) Place 1 drop into both eyes 3 (three) times daily as needed (dry eyes).    ? Insulin Glargine (BASAGLAR KWIKPEN) 100 UNIT/ML INJECT 12 UNITS INTO THE SKIN DAILY 15 mL 3  ? Insulin Pen Needle (BD PEN NEEDLE NANO 2ND GEN) 32G X 4 MM MISC USE AS DIRECTED 100 each 3  ? isosorbide mononitrate (IMDUR) 120 MG 24 hr tablet Take 1 tablet (120 mg total) by mouth daily. 90 tablet 3  ? JANUVIA 50 MG tablet TAKE 1 TABLET BY MOUTH EVERY DAY 90 tablet 1  ? lisinopril (ZESTRIL) 10 MG tablet Take 1 tablet (10 mg total) by mouth daily. 30 tablet 11  ? metoprolol succinate (TOPROL-XL) 25 MG 24 hr tablet Take 1 tablet (25 mg total) by mouth daily. 30 tablet 11  ? mupirocin ointment (BACTROBAN) 2 % Apply 1 application. topically 2 (two) times daily. 30 g 2  ? nitroGLYCERIN (NITROSTAT) 0.4 MG SL tablet PLACE 1 TABLET UNDER THE TONGUE EVERY 5 MINUTES AS NEEDED FOR CHOEST PAIN 25 tablet 1  ? OLANZapine (ZYPREXA) 20 MG tablet TAKE 1 TABLET BY MOUTH EVERYDAY AT BEDTIME 30 tablet 0  ? PARoxetine (PAXIL) 20 MG tablet TAKE 2 TABLETS BY MOUTH EVERY DAY 180 tablet 0  ? QUEtiapine (SEROQUEL) 25 MG tablet     ? simvastatin (ZOCOR) 80 MG tablet TAKE 1 TABLET BY MOUTH EVERYDAY AT BEDTIME    ? XIIDRA 5 %  SOLN Place 1 drop into both eyes in the morning and at bedtime.    ? ?No current facility-administered medications for this visit.  ? ? ?Allergies:   Metformin and related  ? ? ?ROS:  Please see the history of present illness.   Otherwise, review of systems are positive for none.   All other systems are reviewed and negative.  ? ? ?PHYSICAL EXAM: ?VS:  BP 110/62 (BP Location: Left Arm,  Patient Position: Sitting, Cuff Size: Normal)   Pulse 73   Ht '5\' 5"'  (1.651 m)   Wt 134 lb (60.8 kg)   LMP 11/28/2009   BMI 22.30 kg/m?  , BMI Body mass index is 22.3 kg/m?. ?GENERAL:  Well appearing ?NECK:  No jugular venous distention, waveform within normal limits, carotid upstroke brisk and symmetric, no bruits, no thyromegaly ?LUNGS:  Clear to auscultation bilaterally ?CHEST:  Well healed sternotomy scar. ?HEART:  PMI not displaced or sustained,S1 and S2 within normal limits, no S3, no S4, no clicks, no rubs, no murmurs ?ABD:  Flat, positive bowel sounds normal in frequency in pitch, no bruits, no rebound, no guarding, no midline pulsatile mass, no hepatomegaly, no splenomegaly ?EXT:  2 plus pulses throughout, no edema, no cyanosis no clubbing ? ? ? ?Cath 04/2021 ? ?Diagnostic ?Dominance: Co-domiant ? ? ? ?EKG:  EKG is not ordered today. ? ? ?Recent Labs: ?10/29/2020: ALT 9; TSH 0.79 ?05/07/2021: Hemoglobin 12.8; Platelets 199 ?08/11/2021: BUN 19; Creatinine, Ser 1.19; Potassium 4.5; Sodium 137  ? ? ?Lipid Panel ?   ?Component Value Date/Time  ? CHOL 137 05/06/2021 0351  ? CHOL 139 07/17/2020 1009  ? TRIG 113 05/06/2021 0351  ? HDL 49 05/06/2021 0351  ? HDL 58 07/17/2020 1009  ? CHOLHDL 2.8 05/06/2021 0351  ? VLDL 23 05/06/2021 0351  ? [place] 65 05/06/2021 0351  ? [place] 65 07/17/2020 1009  ? LDLDIRECT 145.2 07/18/2008 0821  ? ?  ? ?Wt Readings from Last 3 Encounters:  ?10/02/21 134 lb (60.8 kg)  ?08/20/21 133 lb (60.3 kg)  ?08/11/21 134 lb (60.8 kg)  ?  ? ? ?Other studies Reviewed: ?Additional studies/ records that were  reviewed today include: Labs ?Review of the above records demonstrates:    Please see elsewhere in the note.   ? ? ?ASSESSMENT AND PLAN: ? ?CAD:       Today I am going to add Ranexa 500 mg twice daily to the regimen.  I wil

## 2021-10-02 ENCOUNTER — Encounter: Payer: Self-pay | Admitting: Cardiology

## 2021-10-02 ENCOUNTER — Ambulatory Visit: Payer: 59 | Admitting: Cardiology

## 2021-10-02 VITALS — BP 110/62 | HR 73 | Ht 65.0 in | Wt 134.0 lb

## 2021-10-02 DIAGNOSIS — I25118 Atherosclerotic heart disease of native coronary artery with other forms of angina pectoris: Secondary | ICD-10-CM | POA: Diagnosis not present

## 2021-10-02 DIAGNOSIS — E785 Hyperlipidemia, unspecified: Secondary | ICD-10-CM | POA: Diagnosis not present

## 2021-10-02 DIAGNOSIS — I739 Peripheral vascular disease, unspecified: Secondary | ICD-10-CM | POA: Diagnosis not present

## 2021-10-02 DIAGNOSIS — E118 Type 2 diabetes mellitus with unspecified complications: Secondary | ICD-10-CM

## 2021-10-02 MED ORDER — RANOLAZINE ER 500 MG PO TB12: 500.0000 mg | ORAL_TABLET | Freq: Two times a day (BID) | ORAL | 3 refills | Status: DC

## 2021-10-02 NOTE — Patient Instructions (Signed)
Medication Instructions:  ?Start  Ranexa 500 mg twice a day ?Continue all other medications ?*If you need a refill on your cardiac medications before your next appointment, please call your pharmacy* ? ? ?Lab Work: ?None ordered ? ? ?Testing/Procedures: ?None ordered ? ? ?Follow-Up: ?At [facility], you and your health needs are our priority.  As part of our continuing mission to provide you with exceptional heart care, we have created designated Provider Care Teams.  These Care Teams include your primary Cardiologist (physician) and Advanced Practice Providers (APPs -  Physician Assistants and Nurse Practitioners) who all work together to provide you with the care you need, when you need it. ? ?We recommend signing up for the patient portal called "MyChart".  Sign up information is provided on this After Visit Summary.  MyChart is used to connect with patients for Virtual Visits (Telemedicine).  Patients are able to view lab/test results, encounter notes, upcoming appointments, etc.  Non-urgent messages can be sent to your provider as well.   ?To learn more about what you can do with MyChart, go to [email or web address].   ? ?Your next appointment:  2 months ?  ? ?The format for your next appointment: Office ? ? ?Provider:  Dr.Hochrein's PA ? ? ?Important Information About Sugar ? ? ? ? ? ? ?

## 2021-10-12 ENCOUNTER — Other Ambulatory Visit: Payer: Self-pay | Admitting: Psychiatry

## 2021-10-12 DIAGNOSIS — F3162 Bipolar disorder, current episode mixed, moderate: Secondary | ICD-10-CM

## 2021-10-24 ENCOUNTER — Other Ambulatory Visit: Payer: Self-pay | Admitting: Psychiatry

## 2021-10-24 DIAGNOSIS — F411 Generalized anxiety disorder: Secondary | ICD-10-CM

## 2021-10-28 ENCOUNTER — Other Ambulatory Visit: Payer: Self-pay | Admitting: Psychiatry

## 2021-10-28 DIAGNOSIS — F411 Generalized anxiety disorder: Secondary | ICD-10-CM

## 2021-10-29 ENCOUNTER — Encounter: Payer: Self-pay | Admitting: Family

## 2021-11-02 NOTE — Telephone Encounter (Signed)
I should be able to fill without appointment.

## 2021-11-03 ENCOUNTER — Encounter: Payer: Self-pay | Admitting: Family

## 2021-11-03 ENCOUNTER — Ambulatory Visit (INDEPENDENT_AMBULATORY_CARE_PROVIDER_SITE_OTHER): Payer: 59 | Admitting: Family

## 2021-11-03 VITALS — BP 100/69 | HR 73 | Temp 98.7°F | Resp 16 | Wt 135.0 lb

## 2021-11-03 DIAGNOSIS — E119 Type 2 diabetes mellitus without complications: Secondary | ICD-10-CM

## 2021-11-03 DIAGNOSIS — F411 Generalized anxiety disorder: Secondary | ICD-10-CM

## 2021-11-03 DIAGNOSIS — Z720 Tobacco use: Secondary | ICD-10-CM

## 2021-11-03 DIAGNOSIS — E785 Hyperlipidemia, unspecified: Secondary | ICD-10-CM | POA: Diagnosis not present

## 2021-11-03 DIAGNOSIS — E049 Nontoxic goiter, unspecified: Secondary | ICD-10-CM

## 2021-11-03 DIAGNOSIS — G43009 Migraine without aura, not intractable, without status migrainosus: Secondary | ICD-10-CM | POA: Diagnosis not present

## 2021-11-03 NOTE — Assessment & Plan Note (Addendum)
Lab Results  Component Value Date   TSH 0.79 10/29/2020   Clinically stable in size. Obtain follow up TSH.

## 2021-11-03 NOTE — Progress Notes (Signed)
Subjective:   By signing my name below, I, Bailey Greene, attest that this documentation has been prepared under the direction and in the presence of Bailey Alar NP, 11/03/2021   Patient ID: Bailey Greene, female    DOB: June 05, 1960, 61 y.o.   MRN: [id number]  Chief Complaint  Patient presents with   Anxiety    Complains of increased anxiety   Vaginal probleb    Complains of on and off vaginal swelling with out irritation or discharge.     HPI Patient is in today for an office visit  Anxiety - She is following up with her psychiatrist, Dr. Clovis Greene. She believes that the more she goes, the better it gets. She states that it has been hard dealing with her anxiety. She also states that she is constantly on edge. Her anxiety is better when she is working from home than in the office because everything is controlled. She takes 0.5 Mg of Xanax during the evening to help her sleep. She is currently taking 20 Mg of Zyprexa, 30 Mg of Buspar, and 20 Mg of Paxil.  Blood Sugar - She checks her sugar levels daily. She currently has the [facility] G6 Receiver and is trying to work to get A1C levels down. She is currently taking 50 Mg of Januvia and 12 Units of Basaglar Kwikpen daily  Lab Results  Component Value Date   HGBA1C 6.6 (H) 11/03/2021  Migraines - She denies of any recent migraines Weight - Her weight is decreasing. She has been regularly exercising and maintaining a healthy diet Wt Readings from Last 3 Encounters:  11/03/21 135 lb (61.2 kg)  10/02/21 134 lb (60.8 kg)  08/20/21 133 lb (60.3 kg)  Smoking - She takes 1 or 2 packs a day.  Cardiology - She is UTD with cardiology. She has an upcoming visit with her cardiologist, NP Cleaver on 12/25/2021. Neck Swelling - She states that her swollen neck has been improving.  Health Maintenance Due  Topic Date Due   FOOT EXAM  10/29/2021    Past Medical History:  Diagnosis Date   Anxiety    Bipolar affective disorder ([place])    CAD  (coronary artery disease) 2006   CABG w/ LIMA-LAD, RIMA-Diag, SVG-OM1-OM2, R radial-PDA   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   Qualifier: Diagnosis of  By: Garen Grams     Diabetes mellitus type 2 in nonobese ([place]) 07/2016   Dyslipidemia    Headache(784.0)    History of pulmonary embolism 07/08/2020   HTN (hypertension)    Migraine    NSTEMI (non-ST elevated myocardial infarction) ([place])    PAD (peripheral artery disease) ([place])    Pulmonary embolus ([place])    unprovoked    Past Surgical History:  Procedure Laterality Date   BUNIONECTOMY  08/2011   right foot   CARDIAC CATHETERIZATION  2007   severe native 3 v dz, all grafts patent (LIMA-LAD, RIMA-Diag, SVG-OM1-OM2, R radial-PDA)   CESAREAN SECTION  1984   CORONARY ARTERY BYPASS GRAFT  2006    Coronary artery bypass grafting x5 with a left  internal  mammary to the left anterior descending coronary artery.  Free right  internal mammary to the diagonal coronary artery, sequential reverse  saphenous vein graft to the first and second obtuse marginal, right  artery bypass to the posterior descending coronary artery with endo-vein harvesting.   LEFT HEART CATH AND CORS/GRAFTS ANGIOGRAPHY N/A 05/05/2021   Procedure: LEFT HEART CATH AND CORS/GRAFTS ANGIOGRAPHY;  Surgeon: Leonie Man,  MD;  Location: [facility];  Service: Cardiovascular;  Laterality: N/A;   UMBILICAL HERNIA REPAIR N/A 01/16/2021   Procedure: UMBILICAL  HERNIA REPAIR;  Surgeon: Dwan Bolt, MD;  Location: [facility];  Service: General;  Laterality: N/A;    Family History  Problem Relation Age of Onset   Lupus Mother    Heart attack Father    Diabetes Father    Hypertension Father    Diabetes Paternal Grandmother    Hypertension Paternal Grandmother    Stroke Neg Hx    Kidney disease Neg Hx    Hyperlipidemia Neg Hx    Sudden death Neg Hx     Social History   Socioeconomic History   Marital status: Legally Separated    Spouse name: Not on file   Number of  children: Not on file   Years of education: Not on file   Highest education level: Not on file  Occupational History   Occupation: mortgage loan specialist  Tobacco Use   Smoking status: Some Days    Packs/day: 0.50    Years: 25.00    Total pack years: 12.50    Types: Cigarettes   Smokeless tobacco: Never  Vaping Use   Vaping Use: Never used  Substance and Sexual Activity   Alcohol use: No    Alcohol/week: 0.0 standard drinks of alcohol   Drug use: Never   Sexual activity: Yes    Partners: Male    Comment: married  Other Topics Concern   Not on file  Social History Narrative   Regular exercise:  3 days weekly   Caffeine Use:  1 soda daily   One child biological daughter born in 30 and an adopted niece.   [place] specialist   Married- may be divorcing.          Social Determinants of Health   Financial Resource Strain: Not on file  Food Insecurity: Not on file  Transportation Needs: Not on file  Physical Activity: Not on file  Stress: Not on file  Social Connections: Not on file  Intimate Partner Violence: Not on file    Outpatient Medications Prior to Visit  Medication Sig Dispense Refill   ACCU-CHEK GUIDE test strip USE UP TO FOUR TIMES DAILY AS DIRECTED 400 strip 6   Accu-Chek Softclix Lancets lancets USE UP TO FOUR TIMES DAILY AS DIRECTED 200 each 12   ALPRAZolam (XANAX) 0.5 MG tablet TAKE 2 TABLETS BY MOUTH TWICE A DAY AS NEEDED FOR ANXIETY 60 tablet 3   aspirin 81 MG EC tablet Take 1 tablet (81 mg total) by mouth daily. Swallow whole. 30 tablet 12   atorvastatin (LIPITOR) 80 MG tablet Take 1 tablet (80 mg total) by mouth daily. 90 tablet 3   blood glucose meter kit and supplies KIT Dispense based on patient and insurance preference. Use up to four times daily as directed. (FOR ICD-9 250.00, 250.01). 1 each 0   busPIRone (BUSPAR) 30 MG tablet TAKE 1 TABLET BY MOUTH TWICE A DAY 60 tablet 0   clopidogrel (PLAVIX) 75 MG tablet Take 1 tablet (75  mg total) by mouth daily with breakfast. 90 tablet 3   Continuous Blood Gluc Receiver (DEXCOM G6 RECEIVER) DEVI Use as directed 1 each 1   Continuous Blood Gluc Sensor (DEXCOM G6 SENSOR) MISC Use as directed 1 each 1   Continuous Blood Gluc Transmit (DEXCOM G6 TRANSMITTER) MISC Use as directed 1 each 1   dapagliflozin propanediol (FARXIGA) 10  MG TABS tablet Take 1 tablet (10 mg total) by mouth daily before breakfast. 30 tablet 11   Glycerin-Hypromellose-PEG 400 (VISINE DRY EYE OP) Place 1 drop into both eyes 3 (three) times daily as needed (dry eyes).     Insulin Glargine (BASAGLAR KWIKPEN) 100 UNIT/ML INJECT 12 UNITS INTO THE SKIN DAILY 15 mL 3   Insulin Pen Needle (BD PEN NEEDLE NANO 2ND GEN) 32G X 4 MM MISC USE AS DIRECTED 100 each 3   isosorbide mononitrate (IMDUR) 120 MG 24 hr tablet Take 1 tablet (120 mg total) by mouth daily. 90 tablet 3   JANUVIA 50 MG tablet TAKE 1 TABLET BY MOUTH EVERY DAY 90 tablet 1   lisinopril (ZESTRIL) 10 MG tablet Take 1 tablet (10 mg total) by mouth daily. 30 tablet 11   metoprolol succinate (TOPROL-XL) 25 MG 24 hr tablet Take 1 tablet (25 mg total) by mouth daily. 30 tablet 11   mupirocin ointment (BACTROBAN) 2 % Apply 1 application. topically 2 (two) times daily. 30 g 2   nitroGLYCERIN (NITROSTAT) 0.4 MG SL tablet PLACE 1 TABLET UNDER THE TONGUE EVERY 5 MINUTES AS NEEDED FOR CHOEST PAIN 25 tablet 1   OLANZapine (ZYPREXA) 20 MG tablet TAKE 1 TABLET BY MOUTH EVERYDAY AT BEDTIME 30 tablet 0   PARoxetine (PAXIL) 20 MG tablet TAKE 2 TABLETS BY MOUTH EVERY DAY 180 tablet 0   QUEtiapine (SEROQUEL) 25 MG tablet      ranolazine (RANEXA) 500 MG 12 hr tablet Take 1 tablet (500 mg total) by mouth 2 (two) times daily. 180 tablet 3   simvastatin (ZOCOR) 80 MG tablet TAKE 1 TABLET BY MOUTH EVERYDAY AT BEDTIME     XIIDRA 5 % SOLN Place 1 drop into both eyes in the morning and at bedtime.     divalproex (DEPAKOTE ER) 500 MG 24 hr tablet TAKE 3 TABLETS (1,500 MG TOTAL) BY MOUTH  AT BEDTIME. 90 tablet 0   No facility-administered medications prior to visit.    Allergies  Allergen Reactions   Metformin And Related Nausea And Vomiting    ROS     Objective:    Physical Exam Constitutional:      General: She is not in acute distress.    Appearance: Normal appearance. She is not ill-appearing.  HENT:     Head: Normocephalic and atraumatic.     Right Ear: External ear normal.     Left Ear: External ear normal.  Eyes:     Extraocular Movements: Extraocular movements intact.     Pupils: Pupils are equal, round, and reactive to light.  Neck:     Thyroid: Thyromegaly (Right Larger Than Left) present.  Cardiovascular:     Rate and Rhythm: Normal rate and regular rhythm.     Heart sounds: Normal heart sounds. No murmur heard.    No gallop.  Pulmonary:     Effort: Pulmonary effort is normal. No respiratory distress.     Breath sounds: Normal breath sounds. No wheezing or rales.  Lymphadenopathy:     Cervical: No cervical adenopathy.  Skin:    General: Skin is warm and dry.  Neurological:     Mental Status: She is alert and oriented to person, place, and time.  Psychiatric:        Mood and Affect: Mood normal.        Behavior: Behavior normal.        Judgment: Judgment normal.     BP 100/69 (BP Location: Right Arm, Patient  Position: Sitting, Cuff Size: Small)   Pulse 73   Temp 98.7 F (37.1 C) (Oral)   Resp 16   Wt 135 lb (61.2 kg)   LMP 11/28/2009   SpO2 100%   BMI 22.47 kg/m  Wt Readings from Last 3 Encounters:  11/03/21 135 lb (61.2 kg)  10/02/21 134 lb (60.8 kg)  08/20/21 133 lb (60.3 kg)       Assessment & Plan:   Problem List Items Addressed This Visit       Unprioritized   Tobacco abuse    Discussed smoking cessation.       Migraine without aura    Stable currently.        Hyperlipidemia with target LDL less than 70 - Primary    Lab Results  Component Value Date   CHOL 137 05/06/2021   HDL 49 05/06/2021   LDLCALC  65 05/06/2021   LDLDIRECT 145.2 07/18/2008   TRIG 113 05/06/2021   CHOLHDL 2.8 05/06/2021  LDL at goal. Continue lipitor.       Relevant Orders   Lipid panel   Goiter    Lab Results  Component Value Date   TSH 0.79 10/29/2020  Clinically stable in size. Obtain follow up TSH.       Relevant Orders   TSH (Completed)   Diabetes mellitus type 2 in nonobese [facility])    Lab Results  Component Value Date   HGBA1C 7.0 (H) 08/11/2021   HGBA1C 6.6 (H) 05/06/2021   HGBA1C 6.7 (H) 05/05/2021   Lab Results  Component Value Date   MICROALBUR 0.4 07/18/2020   LDLCALC 65 05/06/2021   CREATININE 1.19 08/11/2021  Last A1C was nearly at goal on basaglar. Continue same.       Relevant Orders   Hemoglobin A1c (Completed)   Basic metabolic panel   Anxiety state    She continues to have significant anxiety and is working with psychiatry.  Working from home is helpful for her from an anxiety standpoint.        No orders of the defined types were placed in this encounter.   I, Nance Pear, NP, personally preformed the services described in this documentation.  All medical record entries made by the scribe were at my direction and in my presence.  I have reviewed the chart and discharge instructions (if applicable) and agree that the record reflects my personal performance and is accurate and complete. 11/03/2021  I,Amber Collins,acting as a scribe for Nance Pear, NP.,have documented all relevant documentation on the behalf of Nance Pear, NP,as directed by  Nance Pear, NP while in the presence of Nance Pear, NP.   Nance Pear, NP

## 2021-11-03 NOTE — Assessment & Plan Note (Addendum)
Lab Results  Component Value Date   HGBA1C 7.0 (H) 08/11/2021   HGBA1C 6.6 (H) 05/06/2021   HGBA1C 6.7 (H) 05/05/2021   Lab Results  Component Value Date   MICROALBUR 0.4 07/18/2020   LDLCALC 65 05/06/2021   CREATININE 1.19 08/11/2021   Last A1C was nearly at goal on basaglar. Continue same.

## 2021-11-03 NOTE — Assessment & Plan Note (Signed)
Stable currently

## 2021-11-03 NOTE — Assessment & Plan Note (Signed)
   Discussed smoking cessation. 

## 2021-11-04 ENCOUNTER — Other Ambulatory Visit: Payer: Self-pay | Admitting: Psychiatry

## 2021-11-04 DIAGNOSIS — F3162 Bipolar disorder, current episode mixed, moderate: Secondary | ICD-10-CM

## 2021-11-04 LAB — TSH: TSH: 0.99 u[IU]/mL (ref 0.35–5.50)

## 2021-11-04 LAB — HEMOGLOBIN A1C: Hgb A1c MFr Bld: 6.6 % — ABNORMAL HIGH (ref 4.6–6.5)

## 2021-11-05 NOTE — Assessment & Plan Note (Signed)
She continues to have significant anxiety and is working with psychiatry.  Working from home is helpful for her from an anxiety standpoint.

## 2021-11-05 NOTE — Assessment & Plan Note (Signed)
Lab Results  Component Value Date   CHOL 137 05/06/2021   HDL 49 05/06/2021   LDLCALC 65 05/06/2021   LDLDIRECT 145.2 07/18/2008   TRIG 113 05/06/2021   CHOLHDL 2.8 05/06/2021   LDL at goal. Continue lipitor.

## 2021-11-09 ENCOUNTER — Telehealth: Payer: Self-pay | Admitting: Family

## 2021-11-09 NOTE — Telephone Encounter (Signed)
Patient advised of results and scheduled to come in for labs on 11-16-2021

## 2021-11-09 NOTE — Telephone Encounter (Signed)
Sugar and thyroid look good.  For some reason bmet and lipid panel were not drawn at the time of her appointment. Please schedule lab draw at her convenience.

## 2021-11-09 NOTE — Addendum Note (Signed)
Addended by: Jiles Prows on: 11/09/2021 03:58 PM   Modules accepted: Orders

## 2021-11-10 ENCOUNTER — Other Ambulatory Visit: Payer: Self-pay | Admitting: Psychiatry

## 2021-11-10 DIAGNOSIS — F411 Generalized anxiety disorder: Secondary | ICD-10-CM

## 2021-11-10 DIAGNOSIS — F4001 Agoraphobia with panic disorder: Secondary | ICD-10-CM

## 2021-11-11 ENCOUNTER — Telehealth: Payer: Self-pay | Admitting: Family

## 2021-11-11 NOTE — Telephone Encounter (Signed)
Bailey Greene called wanting an update on some questions she had asked Windell Moulding. She is needing to know what about the office setting makes the pt uncomfortable and why. She would also like to know if a private office would help. She can be reached at  [phone]. Please advise.

## 2021-11-16 ENCOUNTER — Other Ambulatory Visit: Payer: Self-pay

## 2021-11-16 ENCOUNTER — Telehealth: Payer: Self-pay | Admitting: Psychiatry

## 2021-11-16 ENCOUNTER — Other Ambulatory Visit (INDEPENDENT_AMBULATORY_CARE_PROVIDER_SITE_OTHER): Payer: 59

## 2021-11-16 DIAGNOSIS — E785 Hyperlipidemia, unspecified: Secondary | ICD-10-CM

## 2021-11-16 DIAGNOSIS — E119 Type 2 diabetes mellitus without complications: Secondary | ICD-10-CM | POA: Diagnosis not present

## 2021-11-16 DIAGNOSIS — F4001 Agoraphobia with panic disorder: Secondary | ICD-10-CM

## 2021-11-16 LAB — LIPID PANEL
Cholesterol: 145 mg/dL (ref 0–200)
HDL: 47.6 mg/dL (ref >39.00)
LDL Cholesterol: 77 mg/dL (ref 0–99)
NonHDL: 97.15
Total CHOL/HDL Ratio: 3
Triglycerides: 101 mg/dL (ref 0.0–149.0)
VLDL: 20.2 mg/dL (ref 0.0–40.0)

## 2021-11-16 LAB — BASIC METABOLIC PANEL
BUN: 20 mg/dL (ref 6–23)
CO2: 26 mEq/L (ref 19–32)
Calcium: 10 mg/dL (ref 8.4–10.5)
Chloride: 105 mEq/L (ref 96–112)
Creatinine, Ser: 1.17 mg/dL (ref 0.40–1.20)
GFR: 51.27 mL/min — ABNORMAL LOW (ref >60.00)
Glucose, Bld: 92 mg/dL (ref 70–99)
Potassium: 4.5 mEq/L (ref 3.5–5.1)
Sodium: 140 mEq/L (ref 135–145)

## 2021-11-16 MED ORDER — ALPRAZOLAM 0.5 MG PO TABS: ORAL_TABLET | ORAL | 0 refills | Status: DC

## 2021-11-16 NOTE — Telephone Encounter (Signed)
Bailey Greene called this morning at 11:20 to request refill of her Xanax.  She said she didn't get it filled last month because the prescription is just for 30 days and insurance requires it to be a 90 day so the pharmacy would not fill it.  Please send in a new prescription for a 90 day supply.  Appt 6/21.  Send to [facility] on [address]

## 2021-11-16 NOTE — Telephone Encounter (Signed)
Patient is prescribed prn #30 tablets. She was confused about the dosing. I confirmed with insurance company that #30 tablets is ok. She just needs a new Rx sent, as she has now refills left.

## 2021-11-16 NOTE — Telephone Encounter (Signed)
Pended.

## 2021-11-17 ENCOUNTER — Other Ambulatory Visit: Payer: 59

## 2021-11-18 ENCOUNTER — Encounter: Payer: Self-pay | Admitting: Family

## 2021-11-18 ENCOUNTER — Encounter: Payer: Self-pay | Admitting: Psychiatry

## 2021-11-18 ENCOUNTER — Ambulatory Visit (INDEPENDENT_AMBULATORY_CARE_PROVIDER_SITE_OTHER): Payer: 59 | Admitting: Psychiatry

## 2021-11-18 DIAGNOSIS — F4001 Agoraphobia with panic disorder: Secondary | ICD-10-CM | POA: Diagnosis not present

## 2021-11-18 DIAGNOSIS — F411 Generalized anxiety disorder: Secondary | ICD-10-CM | POA: Diagnosis not present

## 2021-11-18 DIAGNOSIS — F5105 Insomnia due to other mental disorder: Secondary | ICD-10-CM

## 2021-11-18 DIAGNOSIS — F3162 Bipolar disorder, current episode mixed, moderate: Secondary | ICD-10-CM | POA: Diagnosis not present

## 2021-11-18 MED ORDER — BUSPIRONE HCL 30 MG PO TABS: 30.0000 mg | ORAL_TABLET | Freq: Two times a day (BID) | ORAL | 1 refills | Status: DC

## 2021-11-18 MED ORDER — ALPRAZOLAM 0.5 MG PO TABS: ORAL_TABLET | ORAL | 3 refills | Status: DC

## 2021-11-18 MED ORDER — DIVALPROEX SODIUM ER 500 MG PO TB24: 1500.0000 mg | ORAL_TABLET | Freq: Every day | ORAL | 1 refills | Status: DC

## 2021-11-18 MED ORDER — OLANZAPINE 20 MG PO TABS: 20.0000 mg | ORAL_TABLET | Freq: Every day | ORAL | 1 refills | Status: DC

## 2021-11-18 NOTE — Telephone Encounter (Signed)
I spoke to the patient and she feels that she would be comfortable returning to the office in person as long as she has a private office.  Please notify her contact person with this information.

## 2021-11-18 NOTE — Addendum Note (Signed)
Addended by: Kirstie Peri on: 11/18/2021 03:28 PM   Modules accepted: Level of Service

## 2021-11-18 NOTE — Progress Notes (Signed)
Bailey Greene [id number] 1960/08/06 61 y.o.   Subjective:   Patient ID:  Bailey Greene is a 61 y.o. (DOB [birth year]) female.  Chief Complaint:  Chief Complaint  Patient presents with   Follow-up    Mood and anxiety    Anxiety Symptoms include nervous/anxious behavior. Patient reports no chest pain, decreased concentration, dizziness, nausea or palpitations.     Bailey Greene presents for follow-up of bipolar disorder and panic attacks and general anxiety.  visit 5/22,2020.  Increased Depakote then to 1500 mg daily.  Boss rec LOA for 4 weeks.  Going through separation is really tough.  Panic attacks at work interfering.  Had this job 10 years.  Likes the job but hard to concentrate and stay focused.  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  Panic incr pulse and SOB, fear, sweating and shakey.  Panic lasts 20 mins and increased frequency.  Several in a day.  Going on for 2 mos but getting worse.  Boss can tell from listening to her calls and drop in production.  At follow up visit November 22, 2018.  Mood swings are better.  Trouble staying asleep.  Caffeine 1 coffee and 1 soda daily.She was still having severe anxiety plus panic.  We discussed the risk of SSRIs trickling triggering mood swings but the severity of the anxiety was such that we decided to initiate fluoxetine 10 mg daily to increase to 20 mg daily.  We also were using low-dose mirtazapine to help with sleep.  seen January 18, 2019.  She was granted medical leave for panic symptoms.  She was switched to paroxetine for panic from fluoxetine.  Since she was switched from mirtazapine to quetiapine 25 to 50 mg nightly for insomnia.   November 2020 visit with the following noted: It is helping some with anxiety but a lot of stress.  No unusual mood swings without a change.   She's satisfied with the 20 mg paroxetine. Sleep is much better with quetiapine and xanax at night.  5 hour and sometimes better depending on work  schedule.   No meds were changed.    10/23/2019 visit with the following noted: Anxiety, dep, panic attacks all worse lately for 2 mos.  Anger also worse mostly just at home bc manages it at work.  Several triggers.  Sister passed March 26 after illness.  Work and daughter are stressful.  Overwhelming. Not as good staying asleep.  Random panic.  Feels SOB.  Wanting to isolate. Spontaneous crying spell.s Poor concentration affecting work.    Plan: Increase paroxetine to 1-1/2 of the 20 mg tablets daily For sleep increase quetiapine to 2 the 25 mg tablets nightly  03/05/20 appt with following noted: Less anxious and sleeping is better.  Panic can be triggered at work with SOB and heart racing.  Happens about 2 times weekly. Main stress is work is overwhelming.  Talk with customers all day.   No clear mania.   Still some depression too. No SE Plan: no med changes except increase paroxetine to 40 for anxiety  09/02/2020 appt noted: Dx DM since here. Anxiety and panic worse since January.  Consistent with meds.  Stress dx DM.  Sister passed away.  Panic with SOB and occ sweats.  Panic daily. Usually before noon usually with trigger. Xanax makes her relax. 1 cup coffee AM.   Sleep 4-5 hours with quetiapine 25 mg.   And pretty consistent. Mood feels labile.  About to lose it including irritable and angry.  Plan: Increase Quetiapine 25 mg 2 mg HS.   12/10/20 appt noted: Dizziniess for awhile after paroxetine resolved. Still having anxiety and panic but not daily.   Average 7/10 anxiety usually triggered with work as primary stress.  She and H still dealing with problems.  Panic worse either in the AM or evening. Sleep is better 4-5 hours nightly. Taking quetiapine 50 mg with Xanax at night. Still on Depakote ER 1500, busipirone 30 BID , paroxetine 40 mg daily No anger problems at work.  Appetite is better.  Regaining some weight. Evening walks helps mental health. Plan: Start olanzapine 5 mg  daily and if that is not sufficient within a week increase to 10 mg nightly.  We can go higher if needed and call if necessary. BC failure of alternatives, alprazolam 0.5 mg AM before work.   02/18/2021 appointment with the following noted: Anx down to 4-5/10.  Notices calmer with Xanax and throughout the day.Less anxiety and panic at work but getting better. Depression about the same 6/10.  Tries to distract herself from problems at home.. Sleep 6 hours now with olanzapine.   No SE Plan: increase olanzapine 15 mg HS. BC failure of alternatives, alprazolam 0.5 mg AM before work.   paroxetine to 40 mg at night.  04/28/2021 appointment with the following noted: Increase olanzapine 15 mg HS helped awhile with initial dizziness resolved.  Not drowsy. Still better than it was but holidays are hard.  Better than in a long time overall.  H saw a difference also.   Sleep 5-6 hours.  Never been a 7 hour sleeper.   Eating is normal now. Depression still there but better also 3/10.  Can enjoy some things. Better interest. Most stressful thing about work is the work load and talking with customers who  are difficult.  Had this job 13 years. Plan: Increase olanzapine 20 mg 3 hours before HS.  06/10/2021 phone call complaining of persistent insomnia.  She was allowed to increase alprazolam to 0.5 mg every morning and 1 mg nightly  06/30/2021 appointment with the following noted: Taking alprazolam 0.5 mg 2 daily usually. Increased olanzapine to 20 mg daily. CABG 2006.  Valve problem with CP and hospitalized.  Change in med. Going better now. No problems with meds.   About 6 hour sleep and in bed earlier.  Depression is OK overall but residual anxiety and crying.  Anxiety around health now too and work. Panic is not gone but better on Paroxetine 20 Sometimes brief irritab ility situationally. Plan: No med changes.  Continue olanzapine 20 mg, paroxetine 40 mg, alprazolam 0.5 mg twice daily as  needed  11/18/2021 appointment with the following noted: At one point since here anxiety got a lot worse but getting better now.  Some panic attacks including Saturday.   Compliant.  More work stress trying to get caught up.   Only taking alprazolam 0.5 mg HS and not in daytime.  Not sleepy with it daytime. No SE No depression or anxiety.    PDMP only shows Xanax.  She denies abusing substances  Past Psychiatric Medication Trials: Hydroxyzine, mirtazapine poor response for sleep,  Depakote,  Xanax, buspirone,  quetiapine low-dose for sleep Olanzapine 20 duloxetine, citalopram, Wellbutrin, fluoxetine, paroxetine 40  Notes and chart were reviewed with the patient regarding prior history and symptoms.  Review of Systems:  Review of Systems  Cardiovascular:  Negative for chest pain and palpitations.  Gastrointestinal:  Negative for nausea.  Neurological:  Negative for  dizziness and tremors.  Psychiatric/Behavioral:  Positive for dysphoric mood. Negative for decreased concentration and sleep disturbance. The patient is nervous/anxious.     Medications: I have reviewed the patient's current medications.  Current Outpatient Medications  Medication Sig Dispense Refill   ACCU-CHEK GUIDE test strip USE UP TO FOUR TIMES DAILY AS DIRECTED 400 strip 6   Accu-Chek Softclix Lancets lancets USE UP TO FOUR TIMES DAILY AS DIRECTED 200 each 12   ALPRAZolam (XANAX) 0.5 MG tablet TAKE 2 TABLETS BY MOUTH TWICE A DAY AS NEEDED FOR ANXIETY 60 tablet 0   aspirin 81 MG EC tablet Take 1 tablet (81 mg total) by mouth daily. Swallow whole. 30 tablet 12   atorvastatin (LIPITOR) 80 MG tablet Take 1 tablet (80 mg total) by mouth daily. 90 tablet 3   blood glucose meter kit and supplies KIT Dispense based on patient and insurance preference. Use up to four times daily as directed. (FOR ICD-9 250.00, 250.01). 1 each 0   busPIRone (BUSPAR) 30 MG tablet TAKE 1 TABLET BY MOUTH TWICE A DAY 60 tablet 0    clopidogrel (PLAVIX) 75 MG tablet Take 1 tablet (75 mg total) by mouth daily with breakfast. 90 tablet 3   Continuous Blood Gluc Receiver (DEXCOM G6 RECEIVER) DEVI Use as directed 1 each 1   Continuous Blood Gluc Sensor (DEXCOM G6 SENSOR) MISC Use as directed 1 each 1   Continuous Blood Gluc Transmit (DEXCOM G6 TRANSMITTER) MISC Use as directed 1 each 1   dapagliflozin propanediol (FARXIGA) 10 MG TABS tablet Take 1 tablet (10 mg total) by mouth daily before breakfast. 30 tablet 11   divalproex (DEPAKOTE ER) 500 MG 24 hr tablet TAKE 3 TABLETS (1,500 MG TOTAL) BY MOUTH AT BEDTIME. 90 tablet 0   Glycerin-Hypromellose-PEG 400 (VISINE DRY EYE OP) Place 1 drop into both eyes 3 (three) times daily as needed (dry eyes).     Insulin Glargine (BASAGLAR KWIKPEN) 100 UNIT/ML INJECT 12 UNITS INTO THE SKIN DAILY 15 mL 3   Insulin Pen Needle (BD PEN NEEDLE NANO 2ND GEN) 32G X 4 MM MISC USE AS DIRECTED 100 each 3   isosorbide mononitrate (IMDUR) 120 MG 24 hr tablet Take 1 tablet (120 mg total) by mouth daily. 90 tablet 3   JANUVIA 50 MG tablet TAKE 1 TABLET BY MOUTH EVERY DAY 90 tablet 1   lisinopril (ZESTRIL) 10 MG tablet Take 1 tablet (10 mg total) by mouth daily. 30 tablet 11   metoprolol succinate (TOPROL-XL) 25 MG 24 hr tablet Take 1 tablet (25 mg total) by mouth daily. 30 tablet 11   mupirocin ointment (BACTROBAN) 2 % Apply 1 application. topically 2 (two) times daily. 30 g 2   nitroGLYCERIN (NITROSTAT) 0.4 MG SL tablet PLACE 1 TABLET UNDER THE TONGUE EVERY 5 MINUTES AS NEEDED FOR CHOEST PAIN 25 tablet 1   OLANZapine (ZYPREXA) 20 MG tablet TAKE 1 TABLET BY MOUTH EVERYDAY AT BEDTIME 30 tablet 0   PARoxetine (PAXIL) 20 MG tablet TAKE 2 TABLETS BY MOUTH EVERY DAY 180 tablet 0   QUEtiapine (SEROQUEL) 25 MG tablet 1 daily     ranolazine (RANEXA) 500 MG 12 hr tablet Take 1 tablet (500 mg total) by mouth 2 (two) times daily. 180 tablet 3   simvastatin (ZOCOR) 80 MG tablet TAKE 1 TABLET BY MOUTH EVERYDAY AT BEDTIME      XIIDRA 5 % SOLN Place 1 drop into both eyes in the morning and at bedtime.     No  current facility-administered medications for this visit.    Medication Side Effects: ? 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   Qualifier: Diagnosis of  By: Garen Grams     Diabetes mellitus type 2 in nonobese ([place]) 07/2016   Dyslipidemia    Headache(784.0)    History of pulmonary embolism 07/08/2020   HTN (hypertension)    Migraine    NSTEMI (non-ST elevated myocardial infarction) ([place])    PAD (peripheral artery disease) ([place])    Pulmonary embolus (HCC)    unprovoked    Family History  Problem Relation Age of Onset   Lupus Mother    Heart attack Father    Diabetes Father    Hypertension Father    Diabetes Paternal Grandmother    Hypertension Paternal Grandmother    Stroke Neg Hx    Kidney disease Neg Hx    Hyperlipidemia Neg Hx    Sudden death Neg Hx     Social History   Socioeconomic History   Marital status: Legally Separated    Spouse name: Not on file   Number of children: Not on file   Years of education: Not on file   Highest education level: Not on file  Occupational History   Occupation: mortgage loan specialist  Tobacco Use   Smoking status: Some Days    Packs/day: 0.50    Years: 25.00    Total pack years: 12.50    Types: Cigarettes   Smokeless tobacco: Never  Vaping Use   Vaping Use: Never used  Substance and Sexual Activity   Alcohol use: No    Alcohol/week: 0.0 standard drinks of alcohol   Drug use: Never   Sexual activity: Yes    Partners: Male    Comment: married  Other Topics Concern   Not on file  Social History Narrative   Regular exercise:  3 days  weekly   Caffeine Use:  1 soda daily   One child biological daughter born in 69 and an adopted niece.   Center specialist   Married- may be divorcing.          Social Determinants of Health   Financial Resource Strain: Not on file  Food Insecurity: Not on file  Transportation Needs: Not on file  Physical Activity: Not on file  Stress: Not on file  Social Connections: Not on file  Intimate Partner Violence: Not on file    Past Medical History, Surgical history, Social history, and Family history were reviewed and updated as appropriate.   Please see review of systems for further details on the patient's review from today.   Objective:   Physical Exam:  LMP 11/28/2009   Physical Exam Constitutional:      General: She is not in acute distress.    Appearance: She is well-developed.  Musculoskeletal:        General: No deformity.  Neurological:     Mental Status: She is alert and oriented to person, place, and time.     Coordination: Coordination normal.  Psychiatric:        Attention and Perception: She is attentive. She does not perceive auditory hallucinations.  Mood and Affect: Mood is anxious. Mood is not depressed. Affect is not labile, blunt, angry or tearful.        Speech: Speech normal. Speech is not rapid and pressured.        Behavior: Behavior normal. Behavior is not agitated.        Thought Content: Thought content normal. Thought content is not paranoid or delusional. Thought content does not include homicidal or suicidal ideation.        Cognition and Memory: Cognition normal.        Judgment: Judgment normal.     Comments: Insight intact. No auditory or visual hallucinations. No delusions.  Improved not resolved Work stress     Lab Review:     Component Value Date/Time   NA 140 11/16/2021 0948   NA 141 07/23/2020 1300   K 4.5 11/16/2021 0948   CL 105 11/16/2021 0948   CO2 26 11/16/2021 0948   GLUCOSE 92 11/16/2021 0948    BUN 20 11/16/2021 0948   BUN 7 07/23/2020 1300   CREATININE 1.17 11/16/2021 0948   CREATININE 0.72 06/22/2012 0840   CALCIUM 10.0 11/16/2021 0948   PROT 6.6 10/29/2020 1017   PROT 5.8 (L) 07/17/2020 1009   ALBUMIN 4.5 10/29/2020 1017   ALBUMIN 3.6 (L) 07/17/2020 1009   AST 12 10/29/2020 1017   ALT 9 10/29/2020 1017   ALKPHOS 95 10/29/2020 1017   BILITOT 0.5 10/29/2020 1017   BILITOT 0.3 07/17/2020 1009   GFRNONAA >60 05/06/2021 0351   GFRNONAA >89 06/22/2012 0840   GFRAA 102 07/23/2020 1300   GFRAA >89 06/22/2012 0840       Component Value Date/Time   WBC 9.7 05/07/2021 0149   RBC 4.33 05/07/2021 0149   HGB 12.8 05/07/2021 0149   HCT 37.3 05/07/2021 0149   PLT 199 05/07/2021 0149   MCV 86.1 05/07/2021 0149   MCH 29.6 05/07/2021 0149   MCHC 34.3 05/07/2021 0149   RDW 13.8 05/07/2021 0149   LYMPHSABS 2.7 06/22/2012 0840   MONOABS 0.5 06/22/2012 0840   EOSABS 0.1 06/22/2012 0840   BASOSABS 0.0 06/22/2012 0840    No results found for: "POCLITH", "LITHIUM"   No results found for: "PHENYTOIN", "PHENOBARB", "VALPROATE", "CBMZ"   .res Assessment: Plan:    Bailey Greene was seen today for follow-up.  Diagnoses and all orders for this visit:  Moderate mixed bipolar I disorder (HCC)  Panic disorder with agoraphobia  Generalized anxiety disorder  Insomnia due to mental condition  Chronic work stress  Greater than 50% of 30 min face to face time with patient was spent on counseling and coordination of care. We discussed the following: she and her husband have both noted significant improvement and her level of anxiety and she is less depressed with the addition of olanzapine and the increased to 20 mg daily.  It is also a mood stabilizer.  It also can help with depression when added to an SSRI.  We discussed side effects in detail.  This is the usual max dose. Consider further increase olanzapine if necessary Continue olanzapine 20 mg 3 hours before HS. Continue  Depakote ER 1500 mg HS Buspirone 30 BID  BC failure of alternatives, alprazolam 0.5 mg AM before work twice daily.  Alternatives clonazepam, Ativan  Because of the severity of the panic continue paroxetine to 40 mg at night.  It was explained to her this could worsen her mood cycling and to let us know if that occurs.  Fortunately because  no mood swings and her panic and anxiety symptoms are improved but not controlled.    Discussed potential metabolic side effects associated with atypical antipsychotics, as well as potential risk for movement side effects. Advised pt to contact office if movement side effects occur.  Metabolic side effects are unlikely at this low dose of quetiapine and almost no risk of EPS at this low-dose. DM managed  We discussed the short-term risks associated with benzodiazepines including sedation and increased fall risk among others.  Discussed long-term side effect risk including dependence, potential withdrawal symptoms, and the potential eventual dose-related risk of dementia.  But recent studies from 2020 dispute this association between benzodiazepines and dementia risk. Newer studies in 2020 do not support an association with dementia. If too sleepy with it call and will call if needed to switch to lorazepam.   FMLA same as last year.  This appt was 30 mins.   Follow-up 4-6 mos  Lynder Parents MD, DFAPA  Please see After Visit Summary for patient specific instructions.   Future Appointments  Date Time Provider [place]  12/25/2021  3:35 PM Deberah Pelton, NP [facility]  02/12/2022  4:20 PM [name], NP [facility]    No orders of the defined types were placed in this encounter.      -------------------------------

## 2021-11-19 NOTE — Telephone Encounter (Signed)
Called Bailey Greene and advised her of last information. She will work on her accommodations

## 2021-11-20 ENCOUNTER — Ambulatory Visit: Payer: 59 | Admitting: Adult Health

## 2021-12-02 ENCOUNTER — Other Ambulatory Visit: Payer: Self-pay | Admitting: Cardiovascular Disease

## 2021-12-04 ENCOUNTER — Other Ambulatory Visit: Payer: Self-pay | Admitting: Psychiatry

## 2021-12-04 DIAGNOSIS — F4001 Agoraphobia with panic disorder: Secondary | ICD-10-CM

## 2021-12-04 NOTE — Telephone Encounter (Signed)
Last filled 6/19 appt 9/21

## 2021-12-15 ENCOUNTER — Other Ambulatory Visit: Payer: Self-pay | Admitting: Family

## 2021-12-23 NOTE — Progress Notes (Deleted)
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   Qualifier: Diagnosis of  By: Garen Grams     Diabetes mellitus type 2 in nonobese ([place]) 07/2016   Dyslipidemia    Headache(784.0)    History of pulmonary embolism 07/08/2020   HTN (hypertension)    Migraine    NSTEMI (non-ST elevated myocardial infarction) ([place])    PAD (peripheral artery disease) ([place])    Pulmonary embolus ([place])    unprovoked   Past Surgical History:  Procedure Laterality Date   BUNIONECTOMY  08/2011   right foot   CARDIAC CATHETERIZATION  2007   severe native 3 v dz, all grafts patent (LIMA-LAD, RIMA-Diag, SVG-OM1-OM2, R radial-PDA)   CESAREAN SECTION  1984   CORONARY ARTERY BYPASS GRAFT  2006    Coronary artery bypass grafting x5 with a left  internal  mammary to the left anterior descending coronary artery.  Free right  internal mammary to the diagonal coronary artery, sequential reverse  saphenous vein graft to the first and second obtuse marginal, right  artery bypass to the posterior descending coronary artery with endo-vein harvesting.   LEFT HEART CATH AND CORS/GRAFTS ANGIOGRAPHY N/A 05/05/2021   Procedure: LEFT HEART CATH AND CORS/GRAFTS ANGIOGRAPHY;  Surgeon: Leonie Man, MD;  Location: [facility];  Service: Cardiovascular;  Laterality: N/A;   UMBILICAL HERNIA REPAIR N/A 01/16/2021   Procedure: UMBILICAL  HERNIA REPAIR;  Surgeon: Dwan Bolt, MD;  Location: [facility];  Service: General;  Laterality: N/A;    Allergies  Allergies  Allergen Reactions   Metformin And Related Nausea And Vomiting     History of Present Illness    ***  Home Medications    Prior to Admission medications   Medication Sig Start Date End Date Taking? Authorizing Provider  ACCU-CHEK GUIDE test strip USE UP TO FOUR TIMES DAILY AS DIRECTED 08/12/21   [name], NP  Accu-Chek Softclix Lancets lancets USE UP TO FOUR TIMES DAILY AS DIRECTED 12/15/21   [name], NP  ALPRAZolam Duanne Moron) 0.5 MG tablet TAKE 2 TABLETS BY MOUTH TWICE A DAY AS NEEDED FOR ANXIETY 11/18/21   Cottle, Billey Co., MD  aspirin 81 MG EC tablet Take 1 tablet (81 mg total) by mouth daily. Swallow whole. 02/10/21   [name], NP  atorvastatin (LIPITOR) 80 MG tablet Take 1 tablet (80 mg total) by mouth daily. 05/07/21   Troy Sine, MD  blood glucose meter kit and supplies KIT Dispense based on patient and insurance preference. Use up to four times daily as directed. (FOR ICD-9 250.00, 250.01). 07/09/20   Geradine Girt, DO  busPIRone (BUSPAR) 30 MG tablet Take 1 tablet (30 mg total) by mouth 2 (two) times daily. 11/18/21   Cottle, Billey Co., MD  clopidogrel (PLAVIX) 75 MG tablet TAKE 1 TABLET BY MOUTH EVERY DAY WITH BREAKFAST 12/03/21   Troy Sine, MD  Continuous Blood Gluc Receiver (DEXCOM G6 RECEIVER) DEVI Use as directed 09/18/21   [name], NP  Continuous Blood Gluc Sensor (DEXCOM G6 SENSOR) MISC Use  as directed 09/18/21   [name], NP  Continuous Blood Gluc Transmit (DEXCOM G6 TRANSMITTER) MISC Use as directed 09/18/21   [name], NP  dapagliflozin propanediol (FARXIGA) 10 MG TABS tablet Take 1 tablet (10 mg total) by mouth daily before breakfast. 05/06/21   Troy Sine, MD  divalproex (DEPAKOTE ER) 500 MG 24 hr tablet Take 3 tablets (1,500 mg total) by mouth at bedtime. 11/18/21   Cottle, Billey Co., MD  Glycerin-Hypromellose-PEG 400 (VISINE DRY EYE OP) Place 1 drop into both eyes 3 (three) times daily as needed (dry eyes).    [provider]  Insulin Glargine  [facility]) 100 UNIT/ML INJECT 12 UNITS INTO THE SKIN DAILY 07/16/21   [name], NP  Insulin Pen Needle (BD PEN NEEDLE NANO 2ND GEN) 32G X 4 MM MISC USE AS DIRECTED 05/04/21   [name], NP  isosorbide mononitrate (IMDUR) 120 MG 24 hr tablet Take 1 tablet (120 mg total) by mouth daily. 08/20/21 08/15/22  Minus Breeding, MD  JANUVIA 50 MG tablet TAKE 1 TABLET BY MOUTH EVERY DAY 07/08/21   [name], NP  lisinopril (ZESTRIL) 10 MG tablet TAKE 1 TABLET BY MOUTH EVERY DAY 12/03/21   Troy Sine, MD  metoprolol succinate (TOPROL-XL) 25 MG 24 hr tablet Take 1 tablet (25 mg total) by mouth daily. 08/11/21   [name], NP  mupirocin ointment (BACTROBAN) 2 % Apply 1 application. topically 2 (two) times daily. 08/21/21   Bronson Ing, DPM  nitroGLYCERIN (NITROSTAT) 0.4 MG SL tablet PLACE 1 TABLET UNDER THE TONGUE EVERY 5 MINUTES AS NEEDED FOR CHOEST PAIN 06/24/21   Minus Breeding, MD  OLANZapine (ZYPREXA) 20 MG tablet Take 1 tablet (20 mg total) by mouth at bedtime. 11/18/21   Cottle, Billey Co., MD  PARoxetine (PAXIL) 20 MG tablet TAKE 2 TABLETS BY MOUTH EVERY DAY 11/10/21   Cottle, Billey Co., MD  QUEtiapine (SEROQUEL) 25 MG tablet 1 daily 03/05/20   [provider]  ranolazine (RANEXA) 500 MG 12 hr tablet Take 1 tablet (500 mg total) by mouth 2 (two) times daily. 10/02/21   Minus Breeding, MD  simvastatin (ZOCOR) 80 MG tablet TAKE 1 TABLET BY MOUTH EVERYDAY AT BEDTIME 05/05/21   [provider]  XIIDRA 5 % SOLN Place 1 drop into both eyes in the morning and at bedtime. 05/29/20   [provider]    Family History    Family History  Problem Relation Age of Onset   Lupus Mother    Heart attack Father    Diabetes Father    Hypertension Father    Diabetes Paternal Grandmother    Hypertension Paternal Grandmother    Stroke Neg Hx    Kidney disease Neg Hx    Hyperlipidemia Neg Hx    Sudden death Neg Hx    She indicated that  her mother is deceased. She indicated that her father is deceased. She indicated that the status of her paternal grandmother is unknown. She indicated that the status of her neg hx is unknown.  Social History    Social History   Socioeconomic History   Marital status: Legally Separated    Spouse name: Not on file   Number of children: Not on file   Years of education: Not on file   Highest education level: Not on file  Occupational History   Occupation: mortgage loan specialist  Tobacco Use   Smoking status: Some Days    Packs/day: 0.50  Years: 25.00    Total pack years: 12.50    Types: Cigarettes   Smokeless tobacco: Never  Vaping Use   Vaping Use: Never used  Substance and Sexual Activity   Alcohol use: No    Alcohol/week: 0.0 standard drinks of alcohol   Drug use: Never   Sexual activity: Yes    Partners: Male    Comment: married  Other Topics Concern   Not on file  Social History Narrative   Regular exercise:  3 days weekly   Caffeine Use:  1 soda daily   One child biological daughter born in 34 and an adopted niece.   [place] specialist   Married- may be divorcing.          Social Determinants of Health   Financial Resource Strain: Not on file  Food Insecurity: Not on file  Transportation Needs: Not on file  Physical Activity: Not on file  Stress: Not on file  Social Connections: Not on file  Intimate Partner Violence: Not on file     Review of Systems    General:  No chills, fever, night sweats or weight changes.  Cardiovascular:  No chest pain, dyspnea on exertion, edema, orthopnea, palpitations, paroxysmal nocturnal dyspnea. Dermatological: No rash, lesions/masses Respiratory: No cough, dyspnea Urologic: No hematuria, dysuria Abdominal:   No nausea, vomiting, diarrhea, bright red blood per rectum, melena, or hematemesis Neurologic:  No visual changes, wkns, changes in mental status. All other systems reviewed and are otherwise  negative except as noted above.  Physical Exam    VS:  LMP 11/28/2009  , BMI There is no height or weight on file to calculate BMI. GEN: Well nourished, well developed, in no acute distress. HEENT: normal. Neck: Supple, no JVD, carotid bruits, or masses. Cardiac: RRR, no murmurs, rubs, or gallops. No clubbing, cyanosis, edema.  Radials/DP/PT 2+ and equal bilaterally.  Respiratory:  Respirations regular and unlabored, clear to auscultation bilaterally. GI: Soft, nontender, nondistended, BS + x 4. MS: no deformity or atrophy. Skin: warm and dry, no rash. Neuro:  Strength and sensation are intact. Psych: Normal affect.  Accessory Clinical Findings    Recent Labs: 05/07/2021: Hemoglobin 12.8; Platelets 199 11/03/2021: TSH 0.99 11/16/2021: BUN 20; Creatinine, Ser 1.17; Potassium 4.5; Sodium 140   Recent Lipid Panel    Component Value Date/Time   CHOL 145 11/16/2021 0948   CHOL 139 07/17/2020 1009   TRIG 101.0 11/16/2021 0948   HDL 47.60 11/16/2021 0948   HDL 58 07/17/2020 1009   CHOLHDL 3 11/16/2021 0948   VLDL 20.2 11/16/2021 0948   LDLCALC 77 11/16/2021 0948   LDLCALC 65 07/17/2020 1009   LDLDIRECT 145.2 07/18/2008 0821    ECG personally reviewed by me today- *** - No acute changes  Assessment & Plan   1.  ***   Jossie Ng. Roselynne Lortz NP-C     12/23/2021, 11:34 AM [address] Office [phone] Fax [phone]  Notice: This dictation was prepared with Dragon dictation along with smaller phrase technology. Any transcriptional errors that result from this process are unintentional and may not be corrected upon review.  I spent***minutes examining this patient, reviewing medications, and using patient centered shared decision making involving her cardiac care.  Prior to her visit I spent greater than 20 minutes reviewing her past medical history,  medications, and prior cardiac tests.

## 2021-12-25 ENCOUNTER — Ambulatory Visit: Payer: 59 | Admitting: General Practice

## 2022-01-02 ENCOUNTER — Other Ambulatory Visit: Payer: Self-pay | Admitting: Internal Medicine

## 2022-01-02 ENCOUNTER — Other Ambulatory Visit: Payer: Self-pay | Admitting: Family

## 2022-01-25 NOTE — Progress Notes (Unsigned)
Cardiology Clinic Note   Patient Name: Bailey Greene Date of Encounter: 01/27/2022  Primary Care Provider:  [name], NP Primary Cardiologist:  Minus Breeding, MD  Patient Profile    Bailey Greene 61 year old female presents the clinic today for follow-up evaluation of her essential hypertension coronary artery disease.  Past Medical History    Past Medical History:  Diagnosis Date   Anxiety    Bipolar affective disorder ([place])    CAD (coronary artery disease) 2006   CABG w/ LIMA-LAD, RIMA-Diag, SVG-OM1-OM2, R radial-PDA   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   Qualifier: Diagnosis of  By: Garen Grams     Diabetes mellitus type 2 in nonobese ([place]) 07/2016   Dyslipidemia    Headache(784.0)    History of pulmonary embolism 07/08/2020   HTN (hypertension)    Migraine    NSTEMI (non-ST elevated myocardial infarction) ([place])    PAD (peripheral artery disease) ([place])    Pulmonary embolus ([place])    unprovoked   Past Surgical History:  Procedure Laterality Date   BUNIONECTOMY  08/2011   right foot   CARDIAC CATHETERIZATION  2007   severe native 3 v dz, all grafts patent (LIMA-LAD, RIMA-Diag, SVG-OM1-OM2, R radial-PDA)   CESAREAN SECTION  1984   CORONARY ARTERY BYPASS GRAFT  2006    Coronary artery bypass grafting x5 with a left  internal  mammary to the left anterior descending coronary artery.  Free right  internal mammary to the diagonal coronary artery, sequential reverse  saphenous vein graft to the first and second obtuse marginal, right  artery bypass to the posterior descending coronary artery with endo-vein harvesting.   LEFT HEART CATH AND CORS/GRAFTS ANGIOGRAPHY N/A 05/05/2021   Procedure: LEFT HEART CATH AND CORS/GRAFTS ANGIOGRAPHY;  Surgeon: Leonie Man, MD;  Location: [facility];  Service: Cardiovascular;  Laterality: N/A;   UMBILICAL HERNIA REPAIR N/A 01/16/2021   Procedure: UMBILICAL  HERNIA REPAIR;  Surgeon: Dwan Bolt, MD;  Location:  [facility];  Service: General;  Laterality: N/A;    Allergies  Allergies  Allergen Reactions   Metformin And Related Nausea And Vomiting    History of Present Illness    Bailey Greene has a PMH of coronary artery disease status post CABG x5 in 2006.  She had follow-up cardiac catheterization in 2007 which showed patent grafts and LVEF of 65%.  She had a normal stress test 5/12 and again on 7/16 and again 3/18.  She had a low risk stress test 2/19.  She was admitted to the hospital 12/22 with NSTEMI and troponin that peaked at 3359.  Her cardiac catheterization showed multivessel coronary disease and 3 out of 5 patent grafts LIMA-LAD, RIMA-diagonal, radial-RPDA.  Culprit lesion was felt to be 100% occluded SVG-OM1-OM 2 and patent OM1-OM 2 limb.  Medical management was recommended.  She was seen by Dr. Percival Spanish on 10/02/2021.  During that time she reported doing better with Imdur 120 mg daily.  She continued to have chest discomfort with increased emotional stress.  She also noted chest discomfort with walks of around 1/2-hour.  She did not notice resting chest discomfort.  She reported that she took nitroglycerin 2 times since her previous visit.  She presents to the clinic today for follow-up evaluation and states she continues to have intermittent periods of chest discomfort.  She notes these with episodes of increased emotional stress and increased physical activity.  They are brief and dissipate with rest.  She reports that since being  seen in the clinic last she has had 2 separate occasions where she used sublingual nitroglycerin.  She reports compliance with her medications.  She denies side effects.  She reports that she has had 1 cigarette in the last 6 weeks.  We reviewed her lab work from June.  She expressed understanding.  I offered her a referral to the lipid clinic for PCSK9 inhibitor.  She wishes to continue to work on diet, exercise, and stopping smoking.  I will give her the smoking  cessation information, have her increase her physical activity as tolerated, and plan follow-up in 4 to 6 months.  Today she denies shortness of breath, lower extremity edema, fatigue, palpitations, melena, hematuria, hemoptysis, diaphoresis, weakness, presyncope, syncope, orthopnea, and PND.    Home Medications    Prior to Admission medications   Medication Sig Start Date End Date Taking? Authorizing Provider  ACCU-CHEK GUIDE test strip USE UP TO FOUR TIMES DAILY AS DIRECTED 08/12/21   [name], NP  Accu-Chek Softclix Lancets lancets USE UP TO FOUR TIMES DAILY AS DIRECTED 12/15/21   [name], NP  ALPRAZolam Duanne Moron) 0.5 MG tablet TAKE 2 TABLETS BY MOUTH TWICE A DAY AS NEEDED FOR ANXIETY 11/18/21   Cottle, Billey Co., MD  aspirin 81 MG EC tablet Take 1 tablet (81 mg total) by mouth daily. Swallow whole. 02/10/21   [name], NP  atorvastatin (LIPITOR) 80 MG tablet Take 1 tablet (80 mg total) by mouth daily. 05/07/21   Troy Sine, MD  blood glucose meter kit and supplies KIT Dispense based on patient and insurance preference. Use up to four times daily as directed. (FOR ICD-9 250.00, 250.01). 07/09/20   Geradine Girt, DO  busPIRone (BUSPAR) 30 MG tablet Take 1 tablet (30 mg total) by mouth 2 (two) times daily. 11/18/21   Cottle, Billey Co., MD  clopidogrel (PLAVIX) 75 MG tablet TAKE 1 TABLET BY MOUTH EVERY DAY WITH BREAKFAST 12/03/21   Troy Sine, MD  Continuous Blood Gluc Receiver (DEXCOM G6 RECEIVER) DEVI Use as directed 09/18/21   [name], NP  Continuous Blood Gluc Sensor (DEXCOM G6 SENSOR) MISC Use as directed 09/18/21   [name], NP  Continuous Blood Gluc Transmit (DEXCOM G6 TRANSMITTER) MISC Use as directed 09/18/21   [name], NP  dapagliflozin propanediol (FARXIGA) 10 MG TABS tablet Take 1 tablet (10 mg total) by mouth daily before breakfast. 05/06/21   Troy Sine, MD  divalproex (DEPAKOTE ER) 500 MG 24 hr tablet Take  3 tablets (1,500 mg total) by mouth at bedtime. 11/18/21   Cottle, Billey Co., MD  Glycerin-Hypromellose-PEG 400 (VISINE DRY EYE OP) Place 1 drop into both eyes 3 (three) times daily as needed (dry eyes).    [provider]  Insulin Glargine [facility]) 100 UNIT/ML INJECT 12 UNITS INTO THE SKIN DAILY 07/16/21   [name], NP  Insulin Pen Needle (BD PEN NEEDLE NANO 2ND GEN) 32G X 4 MM MISC USE AS DIRECTED 05/04/21   [name], NP  isosorbide mononitrate (IMDUR) 120 MG 24 hr tablet Take 1 tablet (120 mg total) by mouth daily. 08/20/21 08/15/22  Minus Breeding, MD  JANUVIA 50 MG tablet TAKE 1 TABLET BY MOUTH EVERY DAY 01/03/22   [name], NP  lisinopril (ZESTRIL) 10 MG tablet TAKE 1 TABLET BY MOUTH EVERY DAY 12/03/21   Troy Sine, MD  metoprolol succinate (TOPROL-XL) 25 MG 24 hr tablet Take 1 tablet (25 mg total) by mouth  daily. Keep scheduled appointment for further refills 01/04/22   Deberah Pelton, NP  mupirocin ointment (BACTROBAN) 2 % Apply 1 application. topically 2 (two) times daily. 08/21/21   Bronson Ing, DPM  nitroGLYCERIN (NITROSTAT) 0.4 MG SL tablet PLACE 1 TABLET UNDER THE TONGUE EVERY 5 MINUTES AS NEEDED FOR CHOEST PAIN 06/24/21   Minus Breeding, MD  OLANZapine (ZYPREXA) 20 MG tablet Take 1 tablet (20 mg total) by mouth at bedtime. 11/18/21   Cottle, Billey Co., MD  PARoxetine (PAXIL) 20 MG tablet TAKE 2 TABLETS BY MOUTH EVERY DAY 11/10/21   Cottle, Billey Co., MD  QUEtiapine (SEROQUEL) 25 MG tablet 1 daily 03/05/20   [provider]  ranolazine (RANEXA) 500 MG 12 hr tablet Take 1 tablet (500 mg total) by mouth 2 (two) times daily. 10/02/21   Minus Breeding, MD  simvastatin (ZOCOR) 80 MG tablet TAKE 1 TABLET BY MOUTH EVERYDAY AT BEDTIME 05/05/21   [provider]  XIIDRA 5 % SOLN Place 1 drop into both eyes in the morning and at bedtime. 05/29/20   [provider]    Family History    Family History  Problem  Relation Age of Onset   Lupus Mother    Heart attack Father    Diabetes Father    Hypertension Father    Diabetes Paternal Grandmother    Hypertension Paternal Grandmother    Stroke Neg Hx    Kidney disease Neg Hx    Hyperlipidemia Neg Hx    Sudden death Neg Hx    She indicated that her mother is deceased. She indicated that her father is deceased. She indicated that the status of her paternal grandmother is unknown. She indicated that the status of her neg hx is unknown.  Social History    Social History   Socioeconomic History   Marital status: Legally Separated    Spouse name: Not on file   Number of children: Not on file   Years of education: Not on file   Highest education level: Not on file  Occupational History   Occupation: mortgage loan specialist  Tobacco Use   Smoking status: Some Days    Packs/day: 0.50    Years: 25.00    Total pack years: 12.50    Types: Cigarettes   Smokeless tobacco: Never  Vaping Use   Vaping Use: Never used  Substance and Sexual Activity   Alcohol use: No    Alcohol/week: 0.0 standard drinks of alcohol   Drug use: Never   Sexual activity: Yes    Partners: Male    Comment: married  Other Topics Concern   Not on file  Social History Narrative   Regular exercise:  3 days weekly   Caffeine Use:  1 soda daily   One child biological daughter born in 20 and an adopted niece.   [employer] specialist   Married- may be divorcing.          Social Determinants of Health   Financial Resource Strain: Not on file  Food Insecurity: Not on file  Transportation Needs: Not on file  Physical Activity: Not on file  Stress: Not on file  Social Connections: Not on file  Intimate Partner Violence: Not on file     Review of Systems    General:  No chills, fever, night sweats or weight changes.  Cardiovascular:  No chest pain, dyspnea on exertion, edema, orthopnea, palpitations, paroxysmal nocturnal dyspnea. Dermatological:  No rash, lesions/masses Respiratory:  No cough, dyspnea Urologic: No hematuria, dysuria Abdominal:   No nausea, vomiting, diarrhea, bright red blood per rectum, melena, or hematemesis Neurologic:  No visual changes, wkns, changes in mental status. All other systems reviewed and are otherwise negative except as noted above.  Physical Exam    VS:  BP 92/60 (BP Location: Left Arm, Patient Position: Sitting, Cuff Size: Normal)   Pulse 75   Ht '5\' 4"'  (1.626 m)   Wt 133 lb 12.8 oz (60.7 kg)   LMP 11/28/2009   SpO2 99%   BMI 22.97 kg/m  , BMI Body mass index is 22.97 kg/m. GEN: Well nourished, well developed, in no acute distress. HEENT: normal. Neck: Supple, no JVD, carotid bruits, or masses. Cardiac: RRR, no murmurs, rubs, or gallops. No clubbing, cyanosis, edema.  Radials/DP/PT 2+ and equal bilaterally.  Respiratory:  Respirations regular and unlabored, clear to auscultation bilaterally. GI: Soft, nontender, nondistended, BS + x 4. MS: no deformity or atrophy. Skin: warm and dry, no rash. Neuro:  Strength and sensation are intact. Psych: Normal affect.  Accessory Clinical Findings    Recent Labs: 05/07/2021: Hemoglobin 12.8; Platelets 199 11/03/2021: TSH 0.99 11/16/2021: BUN 20; Creatinine, Ser 1.17; Potassium 4.5; Sodium 140   Recent Lipid Panel    Component Value Date/Time   CHOL 145 11/16/2021 0948   CHOL 139 07/17/2020 1009   TRIG 101.0 11/16/2021 0948   HDL 47.60 11/16/2021 0948   HDL 58 07/17/2020 1009   CHOLHDL 3 11/16/2021 0948   VLDL 20.2 11/16/2021 0948   LDLCALC 77 11/16/2021 0948   LDLCALC 65 07/17/2020 1009   LDLDIRECT 145.2 07/18/2008 0821         ECG personally reviewed by me today-none today.  Echocardiogram 08/14/2020 IMPRESSIONS     1. Left ventricular ejection fraction, by estimation, is 60 to 65%. The  left ventricle has normal function. The left ventricle has no regional  wall motion abnormalities. Left ventricular diastolic parameters were   normal. The average left ventricular  global longitudinal strain is -19.5 %.   2. Right ventricular systolic function is normal. The right ventricular  size is normal.   3. The mitral valve is normal in structure. No evidence of mitral valve  regurgitation.   4. The aortic valve is normal in structure. Aortic valve regurgitation is  not visualized. No aortic stenosis is present.   Comparison(s): 07/19/17 EF 55-60%.  Cardiac catheterization 05/05/2021    -----------------NATIVE VESSELS----------------   Ost LAD to Prox LAD lesion is 90% stenosed.   1st Diag lesion is 100% stenosed.   1st Mrg lesion is 90% stenosed. (OM1)   Lat 1st Mrg lesion is 60% stenosed. (OM2) - gives collateral (via residual Sequential SVG limb to OM1)   Mid RCA to Dist RCA lesion is 100% stenosed.   -----------------GRAFTS----------------   LIMA-LAD graft was visualized by angiography and is normal in caliber. Insertion lesion is 40% stenosed &  Dist LAD lesion is 40% stenosed at the anastomosis   Seq SVG- OM1-OM2 graft was not visualized due to known occlusion. Origin to Prox Graft lesion before 1st Mrg  is 100% stenosed. ->  Sequential limb from OM1 to OM 2 is patent with retrograde flow from OM2-OM1   FreeRIMA-1st Diag graft was visualized by angiography and is small.  Insertion lesion is 50% stenosed - otherwise, The graft exhibits no disease.   Left Radial Artery Graft-rPDA, was visualized by angiography.  The graft exhibits no disease.   -----------------HEOMDYNAMICS----------------   LV end  diastolic pressure is normal.   POST-OPERATIVE DIAGNOSIS:   Severe native CAD: Subtotal CTO of mid RCA 90% ostial LAD; the LAD actually does course all the way to the apex with the main branch not grafted being a large septal perforator branch. Codominant LCx with bifurcating OM1 (recorded is OM1 OM 2) that are both small in caliber with 90% OM1 and 60% OM2 stenoses.  Retrograde filling of distal OM1 via the remaining  OM1-OM2 vein graft is still present.  The remainder the LCx runs into the AV groove giving rise to 2 small caliber posterolateral branches with mild diffuse disease. 3.5 of 5 grafts patent Culprit lesion: 100% flush occlusion of SeqSVG-OM1-OM2 with patent OM1-OM2 limb Widely patent LIMA-distal LAD with 40% anastomotic lesion Widely patent free RIMA-Diag with 50% anastomotic lesion Widely patent Left Radial-RPDA with 60% mid-distal PDA disease.  Retrograde filling to small caliber posterolateral system and occluded distal RCA Normal LVEDP Heavily calcified right common femoral artery just proximal to bifurcation.  Estimate potential 50% stenosis.  The right SFA is 100% occluded just after the bifurcation with patent profunda artery that has collateralization. => Recommend PV consult     PLAN OF CARE:   Return to nursing unit for ongoing care.  Does not necessarily require further IV heparin.  Medical management with guideline directed medical therapy. With ACS presentation, I did load with Plavix 300 mg and will start 75 mg daily Plavix (okay to interrupt) for 1 year.   Images reviewed with Dr. Claiborne Billings.   PATIENT DISPOSITION:  PACU - hemodynamically stable.  Assessment & Plan   1.  Coronary artery disease-continues with periods of stable angina.  Cardiac catheterization 12/22 showed 3 out of 5 patent grafts.  Medical management was recommended. Continue aspirin, atorvastatin, clopidogrel, metoprolol, simvastatin Heart healthy low-sodium diet-salty 6 given Increase physical activity as tolerated   Hyperlipidemia-LDL 77.  Offered referral to lipid clinic to discuss PCSK9 inhibitor.  She wishes to continue to work on diet, smoking cessation, and increasing physical activity at this time. Continue simvastatin, aspirin Heart healthy low-sodium high-fiber diet.   Increase physical activity as tolerated   Peripheral vascular disease-denies claudication.  Reports compliance with medical  therapy. Continue aspirin, simvastatin Heart healthy low-sodium diet-salty 6 given Increase physical activity as tolerated   Type 2 diabetes-glucose 92 on 11/16/2021 Continue Januvia, Farxiga Heart healthy low-sodium carb modified diet Increase physical activity as tolerated Follows with PCP   Disposition: Follow-up with Dr. Percival Spanish or me in 6 months.  Jossie Ng. Kveon Casanas NP-C     01/27/2022, 4:11 PM [facility] [address] Office [phone] Fax [phone]  Notice: This dictation was prepared with Dragon dictation along with smaller phrase technology. Any transcriptional errors that result from this process are unintentional and may not be corrected upon review.  I spent 14 minutes examining this patient, reviewing medications, and using patient centered shared decision making involving her cardiac care.  Prior to her visit I spent greater than 20 minutes reviewing her past medical history,  medications, and prior cardiac tests.

## 2022-01-27 ENCOUNTER — Encounter: Payer: Self-pay | Admitting: General Practice

## 2022-01-27 ENCOUNTER — Ambulatory Visit: Payer: 59 | Attending: General Practice | Admitting: General Practice

## 2022-01-27 VITALS — BP 92/60 | HR 75 | Ht 64.0 in | Wt 133.8 lb

## 2022-01-27 DIAGNOSIS — I739 Peripheral vascular disease, unspecified: Secondary | ICD-10-CM | POA: Diagnosis not present

## 2022-01-27 DIAGNOSIS — E118 Type 2 diabetes mellitus with unspecified complications: Secondary | ICD-10-CM

## 2022-01-27 DIAGNOSIS — E785 Hyperlipidemia, unspecified: Secondary | ICD-10-CM

## 2022-01-27 DIAGNOSIS — I25118 Atherosclerotic heart disease of native coronary artery with other forms of angina pectoris: Secondary | ICD-10-CM | POA: Diagnosis not present

## 2022-01-27 NOTE — Patient Instructions (Signed)
Medication Instructions:  The current medical regimen is effective;  continue present plan and medications as directed. Please refer to the Current Medication list given to you today. *If you need a refill on your cardiac medications before your next appointment, please call your pharmacy*  Lab Work:   Testing/Procedures: NONE    NONE If you have labs (blood work) drawn today and your tests are completely normal, you will receive your results only by:  1-MyChart Message (if you have MyChart) OR 2-A paper copy in the mail.  If you have any lab test that is abnormal or we need to change your treatment, we will call you to review the results.  Special Instructions PLEASE READ AND FOLLOW SMOKING CESSATION TIPS-ATTACHED  PLEASE INCREASE PHYSICAL ACTIVITY AS TOLERATED  Follow-Up: Your next appointment:  4-6 month(s) In Person with Rollene Rotunda, MD   Please call our office 2 months in advance to schedule this appointment 1  At [facility], you and your health needs are our priority.  As part of our continuing mission to provide you with exceptional heart care, we have created designated Provider Care Teams.  These Care Teams include your primary Cardiologist (physician) and Advanced Practice Providers (APPs -  Physician Assistants and Nurse Practitioners) who all work together to provide you with the care you need, when you need it.  Important Information About Sugar      Steps to Quit Smoking Smoking tobacco is the leading cause of preventable death. It can affect almost every organ in the body. Smoking puts you and people around you at risk for many serious, long-lasting (chronic) diseases. Quitting smoking can be hard, but it is one of the best things that you can do for your health. It is never too late to quit. Do not give up if you cannot quit the first time. Some people need to try many times to quit. Do your best to stick to your quit plan, and talk with your doctor if you have any  questions or concerns. How do I get ready to quit? Pick a date to quit. Set a date within the next 2 weeks to give you time to prepare. Write down the reasons why you are quitting. Keep this list in places where you will see it often. Tell your family, friends, and co-workers that you are quitting. Their support is important. Talk with your doctor about the choices that may help you quit. Find out if your health insurance will pay for these treatments. Know the people, places, things, and activities that make you want to smoke (triggers). Avoid them. What first steps can I take to quit smoking? Throw away all cigarettes at home, at work, and in your car. Throw away the things that you use when you smoke, such as ashtrays and lighters. Clean your car. Empty the ashtray. Clean your home, including curtains and carpets. What can I do to help me quit smoking? Talk with your doctor about taking medicines and seeing a counselor. You are more likely to succeed when you do both. If you are pregnant or breastfeeding: Talk with your doctor about counseling or other ways to quit smoking. Do not take medicine to help you quit smoking unless your doctor tells you to. Quit right away Quit smoking completely, instead of slowly cutting back on how much you smoke over a period of time. Stopping smoking right away may be more successful than slowly quitting. Go to counseling. In-person is best if this is an  option. You are more likely to quit if you go to counseling sessions regularly. Take medicine You may take medicines to help you quit. Some medicines need a prescription, and some you can buy over-the-counter. Some medicines may contain a drug called nicotine to replace the nicotine in cigarettes. Medicines may: Help you stop having the desire to smoke (cravings). Help to stop the problems that come when you stop smoking (withdrawal symptoms). Your doctor may ask you to use: Nicotine patches, gum, or  lozenges. Nicotine inhalers or sprays. Non-nicotine medicine that you take by mouth. Find resources Find resources and other ways to help you quit smoking and remain smoke-free after you quit. They include: Online chats with a Veterinary surgeon. Phone quitlines. Printed Materials engineer. Support groups or group counseling. Text messaging programs. Mobile phone apps. Use apps on your mobile phone or tablet that can help you stick to your quit plan. Examples of free services include Quit Guide from the CDC and smokefree.gov  What can I do to make it easier to quit?  Talk to your family and friends. Ask them to support and encourage you. Call a phone quitline, such as 1-800-QUIT-NOW, reach out to support groups, or work with a Veterinary surgeon. Ask people who smoke to not smoke around you. Avoid places that make you want to smoke, such as: Bars. Parties. Smoke-break areas at work. Spend time with people who do not smoke. Lower the stress in your life. Stress can make you want to smoke. Try these things to lower stress: Getting regular exercise. Doing deep-breathing exercises. Doing yoga. Meditating. What benefits will I see if I quit smoking? Over time, you may have: A better sense of smell and taste. Less coughing and sore throat. A slower heart rate. Lower blood pressure. Clearer skin. Better breathing. Fewer sick days. Summary Quitting smoking can be hard, but it is one of the best things that you can do for your health. Do not give up if you cannot quit the first time. Some people need to try many times to quit. When you decide to quit smoking, make a plan to help you succeed. Quit smoking right away, not slowly over a period of time. When you start quitting, get help and support to keep you smoke-free. This information is not intended to replace advice given to you by your health care provider. Make sure you discuss any questions you have with your health care provider. Document  Revised: 05/08/2021 Document Reviewed: 05/08/2021 Elsevier Patient Education  2023 [employer].

## 2022-02-12 ENCOUNTER — Ambulatory Visit: Payer: 59 | Admitting: Family

## 2022-02-12 ENCOUNTER — Telehealth: Payer: Self-pay | Admitting: Family

## 2022-02-12 ENCOUNTER — Encounter: Payer: Self-pay | Admitting: Family

## 2022-02-12 VITALS — BP 94/59 | HR 80 | Temp 98.7°F | Resp 16 | Wt 134.0 lb

## 2022-02-12 DIAGNOSIS — E785 Hyperlipidemia, unspecified: Secondary | ICD-10-CM

## 2022-02-12 DIAGNOSIS — Z720 Tobacco use: Secondary | ICD-10-CM

## 2022-02-12 DIAGNOSIS — R5383 Other fatigue: Secondary | ICD-10-CM | POA: Diagnosis not present

## 2022-02-12 DIAGNOSIS — E119 Type 2 diabetes mellitus without complications: Secondary | ICD-10-CM

## 2022-02-12 DIAGNOSIS — M21619 Bunion of unspecified foot: Secondary | ICD-10-CM

## 2022-02-12 DIAGNOSIS — N760 Acute vaginitis: Secondary | ICD-10-CM | POA: Insufficient documentation

## 2022-02-12 DIAGNOSIS — M21611 Bunion of right foot: Secondary | ICD-10-CM | POA: Insufficient documentation

## 2022-02-12 MED ORDER — FLUCONAZOLE 150 MG PO TABS: ORAL_TABLET | ORAL | 0 refills | Status: DC

## 2022-02-12 NOTE — Assessment & Plan Note (Signed)
Per pt report. Declines pelvic exam today. Will give trial of diflucan. She is advised to let me know if her symptoms worsen or if her symptoms fail to improve.

## 2022-02-12 NOTE — Assessment & Plan Note (Signed)
Will check cbc, tsh. However, I think her uncontrolled depression is likely the primary contributor. I encouraged her to keep her upcoming appointment with her psychiatrist.

## 2022-02-12 NOTE — Assessment & Plan Note (Signed)
We discussed podiatry referral. She is not interested in surgery at this time and wishes to hold off on referral.

## 2022-02-12 NOTE — Progress Notes (Signed)
Subjective:   By signing my name below, I, [name], attest that this documentation has been prepared under the direction and in the presence of Karie Chimera, NP 02/12/2022   Patient ID: Bailey Greene, female    DOB: [birth year], 61 y.o.   MRN: [id number]  Chief Complaint  Patient presents with   Diabetes    Here for follow up   Hyperlipidemia    Here for follow up    HPI Patient is in today for an office visit  A1C: She regularly checks her blood sugar levels at home. She reports a range of 80-100 mg/dL. She is currently taking 50 Mg of Januvia and 100 unit/mL of Basaglar.  Lab Results  Component Value Date   HGBA1C 6.6 (H) 11/03/2021   Fatigue/Mood: She complains of fatigue that has occurred awhile ago. Her mood is also worsening. She becomes tearful. When she becomes tearful, it's difficult for her to stop. She is scheduled to see Dr. Clovis Pu on 02/18/2022.  Feet Pain/Numbness: She complains of feet pain and numbness. When she wakes up in the morning, her numbness increases in severity. She denies of burning sensation of the area. Swollen Genital: She complains of swelling in her genital areas. There is also a bump in the area. She is wondering if her symptoms are due to her [place] medication.  Cholesterol: She is currently taking 80 mg of Atorvastatin.  Lab Results  Component Value Date   CHOL 145 11/16/2021   HDL 47.60 11/16/2021   LDLCALC 77 11/16/2021   LDLDIRECT 145.2 07/18/2008   TRIG 101.0 11/16/2021   CHOLHDL 3 11/16/2021   Smoking: She is currently smoking about 3 packs a week.  Migraines: She denies of any recent migraines.   Health Maintenance Due  Topic Date Due   Diabetic kidney evaluation - Urine ACR  07/18/2021   INFLUENZA VACCINE  12/29/2021    Past Medical History:  Diagnosis Date   Anxiety    Bipolar affective disorder ([place])    CAD (coronary artery disease) 2006   CABG w/ LIMA-LAD, RIMA-Diag, SVG-OM1-OM2, R radial-PDA   COMMON  MIGRAINE 01/31/2007   Qualifier: Diagnosis of  By: Garen Grams     Diabetes mellitus type 2 in nonobese ([place]) 07/2016   Dyslipidemia    Headache(784.0)    History of pulmonary embolism 07/08/2020   HTN (hypertension)    Migraine    NSTEMI (non-ST elevated myocardial infarction) ([place])    PAD (peripheral artery disease) ([place])    Pulmonary embolus ([place])    unprovoked    Past Surgical History:  Procedure Laterality Date   BUNIONECTOMY  08/2011   right foot   CARDIAC CATHETERIZATION  2007   severe native 3 v dz, all grafts patent (LIMA-LAD, RIMA-Diag, SVG-OM1-OM2, R radial-PDA)   CESAREAN SECTION  1984   CORONARY ARTERY BYPASS GRAFT  2006    Coronary artery bypass grafting x5 with a left  internal  mammary to the left anterior descending coronary artery.  Free right  internal mammary to the diagonal coronary artery, sequential reverse  saphenous vein graft to the first and second obtuse marginal, right  artery bypass to the posterior descending coronary artery with endo-vein harvesting.   LEFT HEART CATH AND CORS/GRAFTS ANGIOGRAPHY N/A 05/05/2021   Procedure: LEFT HEART CATH AND CORS/GRAFTS ANGIOGRAPHY;  Surgeon: Leonie Man, MD;  Location: [facility];  Service: Cardiovascular;  Laterality: N/A;   UMBILICAL HERNIA REPAIR N/A 01/16/2021   Procedure: UMBILICAL  HERNIA  REPAIR;  Surgeon: Dwan Bolt, MD;  Location: [facility];  Service: General;  Laterality: N/A;    Family History  Problem Relation Age of Onset   Lupus Mother    Heart attack Father    Diabetes Father    Hypertension Father    Diabetes Paternal Grandmother    Hypertension Paternal Grandmother    Stroke Neg Hx    Kidney disease Neg Hx    Hyperlipidemia Neg Hx    Sudden death Neg Hx     Social History   Socioeconomic History   Marital status: Legally Separated    Spouse name: Not on file   Number of children: Not on file   Years of education: Not on file   Highest education level: Not on file   Occupational History   Occupation: mortgage loan specialist  Tobacco Use   Smoking status: Some Days    Packs/day: 0.50    Years: 25.00    Total pack years: 12.50    Types: Cigarettes   Smokeless tobacco: Never  Vaping Use   Vaping Use: Never used  Substance and Sexual Activity   Alcohol use: No    Alcohol/week: 0.0 standard drinks of alcohol   Drug use: Never   Sexual activity: Yes    Partners: Male    Comment: married  Other Topics Concern   Not on file  Social History Narrative   Regular exercise:  3 days weekly   Caffeine Use:  1 soda daily   One child biological daughter born in 69 and an adopted niece.   [place] specialist   Married- may be divorcing.          Social Determinants of Health   Financial Resource Strain: Not on file  Food Insecurity: Not on file  Transportation Needs: Not on file  Physical Activity: Not on file  Stress: Not on file  Social Connections: Not on file  Intimate Partner Violence: Not on file    Outpatient Medications Prior to Visit  Medication Sig Dispense Refill   ACCU-CHEK GUIDE test strip USE UP TO FOUR TIMES DAILY AS DIRECTED 400 strip 6   Accu-Chek Softclix Lancets lancets USE UP TO FOUR TIMES DAILY AS DIRECTED 100 each 12   ALPRAZolam (XANAX) 0.5 MG tablet TAKE 2 TABLETS BY MOUTH TWICE A DAY AS NEEDED FOR ANXIETY 60 tablet 3   aspirin 81 MG EC tablet Take 1 tablet (81 mg total) by mouth daily. Swallow whole. 30 tablet 12   atorvastatin (LIPITOR) 80 MG tablet Take 1 tablet (80 mg total) by mouth daily. 90 tablet 3   blood glucose meter kit and supplies KIT Dispense based on patient and insurance preference. Use up to four times daily as directed. (FOR ICD-9 250.00, 250.01). 1 each 0   busPIRone (BUSPAR) 30 MG tablet Take 1 tablet (30 mg total) by mouth 2 (two) times daily. 180 tablet 1   clopidogrel (PLAVIX) 75 MG tablet TAKE 1 TABLET BY MOUTH EVERY DAY WITH BREAKFAST 90 tablet 3   Continuous Blood Gluc  Receiver (DEXCOM G6 RECEIVER) DEVI Use as directed 1 each 1   Continuous Blood Gluc Sensor (DEXCOM G6 SENSOR) MISC Use as directed 1 each 1   Continuous Blood Gluc Transmit (DEXCOM G6 TRANSMITTER) MISC Use as directed 1 each 1   dapagliflozin propanediol (FARXIGA) 10 MG TABS tablet Take 1 tablet (10 mg total) by mouth daily before breakfast. 30 tablet 11   divalproex (DEPAKOTE ER) 500 MG  24 hr tablet Take 3 tablets (1,500 mg total) by mouth at bedtime. 270 tablet 1   Glycerin-Hypromellose-PEG 400 (VISINE DRY EYE OP) Place 1 drop into both eyes 3 (three) times daily as needed (dry eyes).     Insulin Glargine (BASAGLAR KWIKPEN) 100 UNIT/ML INJECT 12 UNITS INTO THE SKIN DAILY 15 mL 3   Insulin Pen Needle (BD PEN NEEDLE NANO 2ND GEN) 32G X 4 MM MISC USE AS DIRECTED 100 each 3   isosorbide mononitrate (IMDUR) 120 MG 24 hr tablet Take 1 tablet (120 mg total) by mouth daily. 90 tablet 3   JANUVIA 50 MG tablet TAKE 1 TABLET BY MOUTH EVERY DAY 90 tablet 1   lisinopril (ZESTRIL) 10 MG tablet TAKE 1 TABLET BY MOUTH EVERY DAY 90 tablet 3   metoprolol succinate (TOPROL-XL) 25 MG 24 hr tablet Take 1 tablet (25 mg total) by mouth daily. Keep scheduled appointment for further refills 30 tablet 0   mupirocin ointment (BACTROBAN) 2 % Apply 1 application. topically 2 (two) times daily. 30 g 2   nitroGLYCERIN (NITROSTAT) 0.4 MG SL tablet PLACE 1 TABLET UNDER THE TONGUE EVERY 5 MINUTES AS NEEDED FOR CHOEST PAIN 25 tablet 1   OLANZapine (ZYPREXA) 20 MG tablet Take 1 tablet (20 mg total) by mouth at bedtime. 30 tablet 1   PARoxetine (PAXIL) 20 MG tablet TAKE 2 TABLETS BY MOUTH EVERY DAY 180 tablet 0   QUEtiapine (SEROQUEL) 25 MG tablet 1 daily     ranolazine (RANEXA) 500 MG 12 hr tablet Take 1 tablet (500 mg total) by mouth 2 (two) times daily. 180 tablet 3   XIIDRA 5 % SOLN Place 1 drop into both eyes in the morning and at bedtime.     simvastatin (ZOCOR) 80 MG tablet TAKE 1 TABLET BY MOUTH EVERYDAY AT BEDTIME     No  facility-administered medications prior to visit.    Allergies  Allergen Reactions   Metformin And Related Nausea And Vomiting    Review of Systems  Constitutional:  Positive for malaise/fatigue.  Genitourinary:        (+) Swollen Genital  Neurological:  Negative for headaches.       (+) Feet Numbness       Objective:    Physical Exam Constitutional:      General: She is not in acute distress.    Appearance: Normal appearance. She is not ill-appearing.  HENT:     Head: Normocephalic and atraumatic.     Right Ear: External ear normal.     Left Ear: External ear normal.  Eyes:     Extraocular Movements: Extraocular movements intact.     Pupils: Pupils are equal, round, and reactive to light.  Cardiovascular:     Rate and Rhythm: Normal rate and regular rhythm.     Heart sounds: Normal heart sounds. No murmur heard.    No gallop.  Pulmonary:     Effort: Pulmonary effort is normal. No respiratory distress.     Breath sounds: Normal breath sounds. No wheezing or rales.  Musculoskeletal:     Right foot: Bunion (Big Toe) present.  Skin:    General: Skin is warm and dry.  Neurological:     Mental Status: She is alert and oriented to person, place, and time.  Psychiatric:        Mood and Affect: Mood normal.        Behavior: Behavior normal.        Judgment: Judgment normal.  BP (!) 94/59 (BP Location: Right Arm, Patient Position: Sitting, Cuff Size: Small)   Pulse 80   Temp 98.7 F (37.1 C) (Oral)   Resp 16   Wt 134 lb (60.8 kg)   LMP 11/28/2009   SpO2 99%   BMI 23.00 kg/m  Wt Readings from Last 3 Encounters:  02/12/22 134 lb (60.8 kg)  01/27/22 133 lb 12.8 oz (60.7 kg)  11/03/21 135 lb (61.2 kg)   Diabetic Foot Exam - Simple   Simple Foot Form Diabetic Foot exam was performed with the following findings: Yes 02/12/2022  4:44 PM  Visual Inspection No deformities, no ulcerations, no other skin breakdown bilaterally: Yes Sensation Testing Intact to touch  and monofilament testing bilaterally: Yes Pulse Check Posterior Tibialis and Dorsalis pulse intact bilaterally: Yes Comments         Assessment & Plan:   Problem List Items Addressed This Visit       Unprioritized   Vulvovaginitis    Per pt report. Declines pelvic exam today. Will give trial of diflucan. She is advised to let me know if her symptoms worsen or if her symptoms fail to improve.       Tobacco abuse    Still smoking. Encouraged complete cessation.       Hyperlipidemia with target LDL less than 70    Lab Results  Component Value Date   CHOL 145 11/16/2021   HDL 47.60 11/16/2021   LDLCALC 77 11/16/2021   LDLDIRECT 145.2 07/18/2008   TRIG 101.0 11/16/2021   CHOLHDL 3 11/16/2021  Nearly at goal on atorvastatin 51m. Continue same.       Fatigue    Will check cbc, tsh. However, I think her uncontrolled depression is likely the primary contributor. I encouraged her to keep her upcoming appointment with her psychiatrist.       Relevant Orders   CBC with Differential/Platelet   TSH   Diabetes mellitus type 2 in nonobese ([facility]    Lab Results  Component Value Date   HGBA1C 6.6 (H) 11/03/2021   HGBA1C 7.0 (H) 08/11/2021   HGBA1C 6.6 (H) 05/06/2021   Lab Results  Component Value Date   MICROALBUR 0.4 07/18/2020   LDLCALC 77 11/16/2021   CREATININE 1.17 11/16/2021  Clinically stable on basaglar 12 units and januvia 533m Continue same. She likely has some associated neuropathy. She did have sensation with monofilament. Monitor.       Relevant Orders   Hemoglobin A1c   Comp Met (CMET)   Urine Microalbumin w/creat. ratio   Bunion of great toe of right foot    We discussed podiatry referral. She is not interested in surgery at this time and wishes to hold off on referral.       Meds ordered this encounter  Medications   fluconazole (DIFLUCAN) 150 MG tablet    Sig: Take 1 tab by mouth today and then repeat in 3 days    Dispense:  2 tablet     Refill:  0    Order Specific Question:   Supervising Provider    Answer:   BLPenni Homans [[id number]]    I, MeNance PearNP, personally preformed the services described in this documentation.  All medical record entries made by the scribe were at my direction and in my presence.  I have reviewed the chart and discharge instructions (if applicable) and agree that the record reflects my personal performance and is accurate and complete. 02/12/2022   I,Amber  Collins,acting as a Education administrator for Nance Pear, NP.,have documented all relevant documentation on the behalf of Nance Pear, NP,as directed by  Nance Pear, NP while in the presence of Nance Pear, NP.    Nance Pear, NP

## 2022-02-12 NOTE — Assessment & Plan Note (Addendum)
Lab Results  Component Value Date   HGBA1C 6.6 (H) 11/03/2021   HGBA1C 7.0 (H) 08/11/2021   HGBA1C 6.6 (H) 05/06/2021   Lab Results  Component Value Date   MICROALBUR 0.4 07/18/2020   LDLCALC 77 11/16/2021   CREATININE 1.17 11/16/2021   Clinically stable on basaglar 12 units and januvia 50mg . Continue same. She likely has some associated neuropathy. She did have sensation with monofilament. Monitor.

## 2022-02-12 NOTE — Telephone Encounter (Signed)
Pt states she would like a referral to podiatry for her bunions. Stated this was discussed during her visit.

## 2022-02-12 NOTE — Assessment & Plan Note (Addendum)
Lab Results  Component Value Date   CHOL 145 11/16/2021   HDL 47.60 11/16/2021   LDLCALC 77 11/16/2021   LDLDIRECT 145.2 07/18/2008   TRIG 101.0 11/16/2021   CHOLHDL 3 11/16/2021   Nearly at goal on atorvastatin 80mg . Continue same.

## 2022-02-12 NOTE — Assessment & Plan Note (Signed)
Still smoking. Encouraged complete cessation.

## 2022-02-15 NOTE — Telephone Encounter (Signed)
Please call patient to advise

## 2022-02-16 NOTE — Addendum Note (Signed)
Addended by: [name] on: 02/16/2022 07:31 AM   Modules accepted: Orders

## 2022-02-18 ENCOUNTER — Other Ambulatory Visit: Payer: 59

## 2022-02-18 ENCOUNTER — Ambulatory Visit (INDEPENDENT_AMBULATORY_CARE_PROVIDER_SITE_OTHER): Payer: 59 | Admitting: Psychiatry

## 2022-02-22 ENCOUNTER — Encounter: Payer: Self-pay | Admitting: Podiatry

## 2022-02-22 ENCOUNTER — Ambulatory Visit (INDEPENDENT_AMBULATORY_CARE_PROVIDER_SITE_OTHER): Payer: 59 | Admitting: Podiatry

## 2022-02-22 ENCOUNTER — Ambulatory Visit (INDEPENDENT_AMBULATORY_CARE_PROVIDER_SITE_OTHER): Payer: 59

## 2022-02-22 DIAGNOSIS — M79671 Pain in right foot: Secondary | ICD-10-CM | POA: Diagnosis not present

## 2022-02-22 DIAGNOSIS — M21619 Bunion of unspecified foot: Secondary | ICD-10-CM

## 2022-02-22 DIAGNOSIS — M79672 Pain in left foot: Secondary | ICD-10-CM

## 2022-02-22 DIAGNOSIS — I739 Peripheral vascular disease, unspecified: Secondary | ICD-10-CM

## 2022-02-23 ENCOUNTER — Other Ambulatory Visit: Payer: Self-pay | Admitting: Psychiatry

## 2022-02-23 DIAGNOSIS — F4001 Agoraphobia with panic disorder: Secondary | ICD-10-CM

## 2022-02-23 DIAGNOSIS — F411 Generalized anxiety disorder: Secondary | ICD-10-CM

## 2022-02-23 NOTE — Progress Notes (Signed)
Subjective:   Patient ID: Bailey Greene, female   DOB: 61 y.o.   MRN: [id number]   HPI Patient presents stating that she has a painful bunion deformity right foot over left and its been going on around 6 months.  She has been trying shoe gear modifications and other modalities and states also that she is concerned about her circulation and has not had it checked except for her veins   ROS      Objective:  Physical Exam  Neuro status intact with pulses found to be limited bilateral nonpalpable DP PT pulses with upon questioning what appears to be claudication-like symptomatology irritation and discomfort around the first MPJ right over left with fluid buildup in the area and structural changes of the first metatarsal right of the significant fashion.  Patient     Assessment:  Reviewed condition and discussed that I am concerned about the circulatory status and its relationship to the problems and at this point I have recommended vascular studies and depending on the results we could consider bunion correction at 1 point in future.  Patient is scheduled for this and will be seen back       Plan:  H&P and circulatory studies are scheduled for today and when we get results we will make a decision on bunion correction  X-rays indicate that there is elevation of the intermetatarsal angle right significant nature deviation hallux minimal on the left

## 2022-02-24 ENCOUNTER — Other Ambulatory Visit: Payer: Self-pay | Admitting: Cardiology

## 2022-02-25 ENCOUNTER — Other Ambulatory Visit (INDEPENDENT_AMBULATORY_CARE_PROVIDER_SITE_OTHER): Payer: 59

## 2022-02-25 DIAGNOSIS — R5383 Other fatigue: Secondary | ICD-10-CM | POA: Diagnosis not present

## 2022-02-25 DIAGNOSIS — E119 Type 2 diabetes mellitus without complications: Secondary | ICD-10-CM | POA: Diagnosis not present

## 2022-02-25 LAB — COMPREHENSIVE METABOLIC PANEL
ALT: 13 U/L (ref 0–35)
AST: 17 U/L (ref 0–37)
Albumin: 4.5 g/dL (ref 3.5–5.2)
Alkaline Phosphatase: 128 U/L — ABNORMAL HIGH (ref 39–117)
BUN: 18 mg/dL (ref 6–23)
CO2: 26 mEq/L (ref 19–32)
Calcium: 9.6 mg/dL (ref 8.4–10.5)
Chloride: 106 mEq/L (ref 96–112)
Creatinine, Ser: 1.17 mg/dL (ref 0.40–1.20)
GFR: 51.17 mL/min — ABNORMAL LOW (ref >60.00)
Glucose, Bld: 89 mg/dL (ref 70–99)
Potassium: 4.4 mEq/L (ref 3.5–5.1)
Sodium: 138 mEq/L (ref 135–145)
Total Bilirubin: 0.6 mg/dL (ref 0.2–1.2)
Total Protein: 6.8 g/dL (ref 6.0–8.3)

## 2022-02-25 LAB — HM DIABETES EYE EXAM

## 2022-02-25 LAB — CBC WITH DIFFERENTIAL/PLATELET
Basophils Absolute: 0 10*3/uL (ref 0.0–0.1)
Basophils Relative: 0.6 % (ref 0.0–3.0)
Eosinophils Absolute: 0.3 10*3/uL (ref 0.0–0.7)
Eosinophils Relative: 5.4 % — ABNORMAL HIGH (ref 0.0–5.0)
HCT: 36.5 % (ref 36.0–46.0)
Hemoglobin: 12.3 g/dL (ref 12.0–15.0)
Lymphocytes Relative: 36.3 % (ref 12.0–46.0)
Lymphs Abs: 2.2 10*3/uL (ref 0.7–4.0)
MCHC: 33.8 g/dL (ref 30.0–36.0)
MCV: 91.4 fl (ref 78.0–100.0)
Monocytes Absolute: 0.4 10*3/uL (ref 0.1–1.0)
Monocytes Relative: 7.3 % (ref 3.0–12.0)
Neutro Abs: 3.1 10*3/uL (ref 1.4–7.7)
Neutrophils Relative %: 50.4 % (ref 43.0–77.0)
Platelets: 187 10*3/uL (ref 150.0–400.0)
RBC: 3.99 Mil/uL (ref 3.87–5.11)
RDW: 14.4 % (ref 11.5–15.5)
WBC: 6.1 10*3/uL (ref 4.0–10.5)

## 2022-02-25 LAB — MICROALBUMIN / CREATININE URINE RATIO
Creatinine,U: 11.7 mg/dL
Microalb Creat Ratio: 6 mg/g (ref 0.0–30.0)
Microalb, Ur: <0.7 mg/dL (ref 0.0–1.9)

## 2022-02-25 LAB — HEMOGLOBIN A1C: Hgb A1c MFr Bld: 6.6 % — ABNORMAL HIGH (ref 4.6–6.5)

## 2022-02-25 LAB — TSH: TSH: 0.68 u[IU]/mL (ref 0.35–5.50)

## 2022-02-26 ENCOUNTER — Telehealth: Payer: Self-pay | Admitting: Family

## 2022-02-26 DIAGNOSIS — R748 Abnormal levels of other serum enzymes: Secondary | ICD-10-CM

## 2022-02-26 NOTE — Telephone Encounter (Signed)
Sugar is stable/at goal.  Her alk phosphatase level (one of the liver related tests) is mildly elevated.  I would like her to repeat in 1 month. Orders placed. 

## 2022-02-26 NOTE — Telephone Encounter (Signed)
Patient advised of results and scheduled to come in for labs 03-29-22

## 2022-03-02 ENCOUNTER — Encounter: Payer: Self-pay | Admitting: *Deleted

## 2022-03-05 ENCOUNTER — Encounter: Payer: Self-pay | Admitting: Family

## 2022-03-05 ENCOUNTER — Other Ambulatory Visit: Payer: Self-pay | Admitting: Family

## 2022-03-19 ENCOUNTER — Other Ambulatory Visit: Payer: Self-pay | Admitting: General Practice

## 2022-03-23 ENCOUNTER — Encounter: Payer: Self-pay | Admitting: Family

## 2022-03-24 ENCOUNTER — Ambulatory Visit (HOSPITAL_COMMUNITY)
Admission: RE | Admit: 2022-03-24 | Discharge: 2022-03-24 | Disposition: A | Discharge status: Home / Self Care | Payer: 59 | Source: Ambulatory Visit | Attending: Podiatry | Admitting: Podiatry

## 2022-03-24 DIAGNOSIS — I739 Peripheral vascular disease, unspecified: Secondary | ICD-10-CM | POA: Insufficient documentation

## 2022-03-24 NOTE — Telephone Encounter (Signed)
Forms received. Placed into MO bin up front

## 2022-03-25 NOTE — Telephone Encounter (Signed)
Form put in provider's folder to be completed

## 2022-03-29 ENCOUNTER — Other Ambulatory Visit (INDEPENDENT_AMBULATORY_CARE_PROVIDER_SITE_OTHER): Payer: 59

## 2022-03-29 DIAGNOSIS — R748 Abnormal levels of other serum enzymes: Secondary | ICD-10-CM

## 2022-03-29 DIAGNOSIS — Z0279 Encounter for issue of other medical certificate: Secondary | ICD-10-CM

## 2022-03-29 NOTE — Addendum Note (Signed)
Addended by: Manuela Schwartz on: 03/29/2022 03:01 PM   Modules accepted: Orders

## 2022-03-29 NOTE — Addendum Note (Signed)
Addended by: Manuela Schwartz on: 03/29/2022 03:04 PM   Modules accepted: Orders

## 2022-04-05 ENCOUNTER — Telehealth: Payer: Self-pay | Admitting: Family

## 2022-04-05 DIAGNOSIS — R748 Abnormal levels of other serum enzymes: Secondary | ICD-10-CM

## 2022-04-05 LAB — HEPATIC FUNCTION PANEL
ALT: 9 IU/L (ref 0–32)
AST: 14 IU/L (ref 0–40)
Albumin: 4.6 g/dL (ref 3.8–4.9)
Alkaline Phosphatase: 152 IU/L — ABNORMAL HIGH (ref 44–121)
Bilirubin Total: 0.4 mg/dL (ref 0.0–1.2)
Bilirubin, Direct: 0.14 mg/dL (ref 0.00–0.40)
Total Protein: 6.6 g/dL (ref 6.0–8.5)

## 2022-04-05 LAB — ALKALINE PHOSPHATASE, ISOENZYMES
BONE FRACTION: 68 % (ref 14–68)
INTESTINAL FRAC.: 7 % (ref 0–18)
LIVER FRACTION: 25 % (ref 18–85)

## 2022-04-05 NOTE — Telephone Encounter (Signed)
Please advise patient that her alk phos level remains elevated and lab work suggests it is coming from her bones.  I would recommend a bone scan for further evaluation.

## 2022-04-06 ENCOUNTER — Ambulatory Visit: Payer: 59 | Admitting: Vascular Surgery

## 2022-04-06 ENCOUNTER — Encounter: Payer: Self-pay | Admitting: Vascular Surgery

## 2022-04-06 VITALS — BP 113/74 | HR 75 | Temp 98.7°F | Resp 18 | Ht 64.0 in | Wt 134.1 lb

## 2022-04-06 DIAGNOSIS — I739 Peripheral vascular disease, unspecified: Secondary | ICD-10-CM

## 2022-04-06 MED ORDER — CILOSTAZOL 100 MG PO TABS: 100.0000 mg | ORAL_TABLET | Freq: Two times a day (BID) | ORAL | 11 refills | Status: DC

## 2022-04-06 NOTE — Telephone Encounter (Signed)
Also, you can let her know that I have written her episodic flares and appointment follow ups for her cardiology issues for 1 year. She can pick up amended copy or we can leave her a copy to pick up.

## 2022-04-06 NOTE — Telephone Encounter (Signed)
Patient advised her form was amended and I will fax again to [place] at her request. She was notified of labs and will be here tomorrow for follow up.

## 2022-04-06 NOTE — Progress Notes (Signed)
[facility] [place]  ASSESSMENT / PLAN: Bailey Greene is a 61 y.o. 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 Intervention should only be considered for medically optimized patients with disabling symptoms.   Recommend the following which can slow the progression of atherosclerosis and reduce the risk of major adverse cardiac / limb events:  Complete cessation from all tobacco products. Blood glucose control with goal A1c < 7%. Blood pressure control with goal blood pressure < 140/90 mmHg. Lipid reduction therapy with goal LDL-C <100 mg/dL (<70 if symptomatic from PAD).  Aspirin 80m PO QD.  Atorvastatin 40-80mg PO QD (or other "high intensity" statin therapy). Daily walking to and past the point of discomfort. Patient counseled to keep a log of exercise distance.  Her symptoms are progressing. She is still sporadically smoking. I counsled her we would not pursue intervention until she has completely stopped smoking. Follow up in 3 months with repeat ABI.  Most recent ejection fraction is good, I will prescribe her Pletal.  CHIEF COMPLAINT: Peripheral arterial disease demonstrated on recent angiogram.  HISTORY OF PRESENT ILLNESS: Bailey Fitzhenryis a 61y.o. female for to clinic after recent cardiac catheterization demonstrated calcified common femoral artery and occluded right superficial femoral artery.  The patient is relatively asymptomatic from a peripheral arterial disease standpoint.  She reports some discomfort and a sensation of "heaviness" in her calves after walking for more than 30 minutes.  She is able to do all of her independent activities of daily living without difficulty.  She can shop through  a grocery store without discomfort in her calves.  She does not report symptoms of rest pain.  She does not have any ischemic ulceration about her feet.  04/06/22: She returns for surveillance.  She thinks her symptoms are getting a bit worse.  She continues to smoke sporadically.  She has no ischemic rest pain symptoms.  She has no ulcers about her feet.  Past Medical History:  Diagnosis Date   Anxiety    [place] affective [place]    CAD (coronary artery disease) 2006   CABG w/ LIMA-LAD, RIMA-Diag, SVG-OM1-OM2, R radial-PDA   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   Qualifier: Diagnosis of  By: AGaren Grams    Diabetes mellitus type 2 in nonobese (Bailey Greene 07/2016   Dyslipidemia    Headache(784.0)    History of pulmonary embolism 07/08/2020   HTN (hypertension)    Migraine    NSTEMI (non-ST elevated myocardial infarction) (Bailey Greene    PAD (peripheral artery disease) (Bailey Greene    Pulmonary embolus (Bailey Greene    unprovoked    Past Surgical History:  Procedure Laterality Date   BUNIONECTOMY  08/2011   right foot   CARDIAC CATHETERIZATION  2007   severe native 3 v dz, all grafts patent (LIMA-LAD, RIMA-Diag, SVG-OM1-OM2, R radial-PDA)   CESAREAN SECTION  1984   CORONARY ARTERY BYPASS GRAFT  2006    Coronary artery bypass grafting x5 with a left  internal  mammary to the left anterior descending coronary artery.  Free right  internal mammary to the diagonal coronary artery, sequential reverse  saphenous vein graft to the first and second obtuse marginal, right  artery bypass to the posterior descending coronary artery with endo-vein harvesting.   LEFT HEART CATH  AND CORS/GRAFTS ANGIOGRAPHY N/A 05/05/2021   Procedure: LEFT HEART CATH AND CORS/GRAFTS ANGIOGRAPHY;  Surgeon: Leonie Man, MD;  Location: [facility];  Service: Cardiovascular;  Laterality: N/A;   UMBILICAL HERNIA REPAIR N/A 01/16/2021   Procedure: UMBILICAL  HERNIA REPAIR;  Surgeon: Dwan Bolt, MD;  Location: [facility];  Service:  General;  Laterality: N/A;    Family History  Problem Relation Age of Onset   Lupus Mother    Heart attack Father    Diabetes Father    Hypertension Father    Diabetes Paternal Grandmother    Hypertension Paternal Grandmother    Stroke Neg Hx    Kidney disease Neg Hx    Hyperlipidemia Neg Hx    Sudden death Neg Hx     Social History   Socioeconomic History   Marital status: Legally Separated    Spouse name: Not on file   Number of children: Not on file   Years of education: Not on file   Highest education level: Not on file  Occupational History   Occupation: mortgage loan specialist  Tobacco Use   Smoking status: Some Days    Years: 25.00    Types: Cigarettes   Smokeless tobacco: Never   Tobacco comments:    0.5 pack cigarettes  per month per patient   Vaping Use   Vaping Use: Never used  Substance and Sexual Activity   Alcohol use: No    Alcohol/week: 0.0 standard drinks of alcohol   Drug use: Never   Sexual activity: Yes    Partners: Male    Comment: married  Other Topics Concern   Not on file  Social History Narrative   Regular exercise:  3 days weekly   Caffeine Use:  1 soda daily   One child biological daughter born in [birth year] and an adopted niece.   [place] specialist   Married- may be divorcing.          Social Determinants of Health   Financial Resource Strain: Not on file  Food Insecurity: Not on file  Transportation Needs: Not on file  Physical Activity: Not on file  Stress: Not on file  Social Connections: Not on file  Intimate Partner Violence: Not on file    Allergies  Allergen Reactions   Metformin And Related Nausea And Vomiting    Current Outpatient Medications  Medication Sig Dispense Refill   ACCU-CHEK GUIDE test strip USE UP TO FOUR TIMES DAILY AS DIRECTED 400 strip 6   Accu-Chek Softclix Lancets lancets USE UP TO FOUR TIMES DAILY AS DIRECTED 100 each 12   ALPRAZolam (XANAX) 0.5 MG tablet TAKE 2 TABLETS BY  MOUTH TWICE A DAY AS NEEDED FOR ANXIETY 60 tablet 3   aspirin 81 MG EC tablet Take 1 tablet (81 mg total) by mouth daily. Swallow whole. 30 tablet 12   atorvastatin (LIPITOR) 80 MG tablet Take 1 tablet (80 mg total) by mouth daily. 90 tablet 3   blood glucose meter kit and supplies KIT Dispense based on patient and insurance preference. Use up to four times daily as directed. (FOR ICD-9 250.00, 250.01). 1 each 0   busPIRone (BUSPAR) 30 MG tablet Take 1 tablet (30 mg total) by mouth 2 (two) times daily. 180 tablet 1   clopidogrel (PLAVIX) 75 MG tablet TAKE 1 TABLET BY MOUTH EVERY DAY WITH BREAKFAST 90 tablet 3   Continuous Blood Gluc Receiver (DEXCOM G6 RECEIVER) DEVI Use as directed 1 each 1  Continuous Blood Gluc Sensor (DEXCOM G6 SENSOR) MISC Use as directed 1 each 1   Continuous Blood Gluc Transmit (DEXCOM G6 TRANSMITTER) MISC Use as directed 1 each 1   dapagliflozin propanediol (FARXIGA) 10 MG TABS tablet Take 1 tablet (10 mg total) by mouth daily before breakfast. 30 tablet 11   divalproex (DEPAKOTE ER) 500 MG 24 hr tablet Take 3 tablets (1,500 mg total) by mouth at bedtime. 270 tablet 1   fluconazole (DIFLUCAN) 150 MG tablet TAKE 1 TAB BY MOUTH TODAY AND THEN REPEAT IN 3 DAYS 2 tablet 0   Glycerin-Hypromellose-PEG 400 (VISINE DRY EYE OP) Place 1 drop into both eyes 3 (three) times daily as needed (dry eyes).     Insulin Glargine (BASAGLAR KWIKPEN) 100 UNIT/ML INJECT 12 UNITS INTO THE SKIN DAILY 15 mL 3   Insulin Pen Needle (BD PEN NEEDLE NANO 2ND GEN) 32G X 4 MM MISC USE AS DIRECTED 100 each 3   isosorbide mononitrate (IMDUR) 120 MG 24 hr tablet Take 1 tablet (120 mg total) by mouth daily. 90 tablet 3   JANUVIA 50 MG tablet TAKE 1 TABLET BY MOUTH EVERY DAY 90 tablet 1   lisinopril (ZESTRIL) 10 MG tablet TAKE 1 TABLET BY MOUTH EVERY DAY 90 tablet 3   metoprolol succinate (TOPROL-XL) 25 MG 24 hr tablet TAKE 1 TABLET BY MOUTH EVERY DAY *KEEP APPOINTMENT FOR REFILLS* 90 tablet 3   mupirocin  ointment (BACTROBAN) 2 % Apply 1 application. topically 2 (two) times daily. 30 g 2   nitroGLYCERIN (NITROSTAT) 0.4 MG SL tablet PLACE 1 TABLET UNDER THE TONGUE EVERY 5 MINUTES AS NEEDED FOR CHOEST PAIN 25 tablet 1   OLANZapine (ZYPREXA) 20 MG tablet Take 1 tablet (20 mg total) by mouth at bedtime. 30 tablet 1   PARoxetine (PAXIL) 20 MG tablet TAKE 2 TABLETS BY MOUTH EVERY DAY 180 tablet 0   QUEtiapine (SEROQUEL) 25 MG tablet 1 daily     ranolazine (RANEXA) 500 MG 12 hr tablet Take 1 tablet (500 mg total) by mouth 2 (two) times daily. 180 tablet 3   XIIDRA 5 % SOLN Place 1 drop into both eyes in the morning and at bedtime.     No current facility-administered medications for this visit.      PHYSICAL EXAM Vitals:   04/06/22 1259  BP: 113/74  Pulse: 75  Resp: 18  Temp: 98.7 F (37.1 C)  TempSrc: Temporal  SpO2: 100%  Weight: 134 lb 1.6 oz (60.8 kg)  Height: _0  (1.626 m)    Constitutional: well appearing. no distress. Appears well nourished.  Neurologic: CN intact. no focal findings. no sensory loss. Psychiatric:  Mood and affect symmetric and appropriate. Eyes:  No icterus. No conjunctival pallor. Ears, nose, throat:  mucous membranes moist. Midline trachea.  Cardiac: regular rate and rhythm.  Respiratory:  unlabored. Abdominal:  soft, non-tender, non-distended.  Peripheral vascular: no palpable pedal pulses. Feet are warm. Extremity: no edema. no cyanosis. no pallor.  Skin: no gangrene. no ulceration.  Lymphatic: no Stemmer's sign. no palpable lymphadenopathy.  PERTINENT LABORATORY AND RADIOLOGIC DATA  Most recent CBC    Latest Ref Rng & Units 02/25/2022    9:50 AM 05/07/2021    1:49 AM 05/06/2021    3:51 AM  CBC  WBC 4.0 - 10.5 K/uL 6.1  9.7  10.3   Hemoglobin 12.0 - 15.0 g/dL 12.3  12.8  12.5   Hematocrit 36.0 - 46.0 % 36.5  37.3  36.4   Platelets  150.0 - 400.0 K/uL 187.0  199  180      Most recent CMP    Latest Ref Rng & Units 03/29/2022    3:04 PM 02/25/2022     9:50 AM 11/16/2021    9:48 AM  CMP  Glucose 70 - 99 mg/dL  89  92   BUN 6 - 23 mg/dL  18  20   Creatinine 0.40 - 1.20 mg/dL  1.17  1.17   Sodium 135 - 145 mEq/L  138  140   Potassium 3.5 - 5.1 mEq/L  4.4  4.5   Chloride 96 - 112 mEq/L  106  105   CO2 19 - 32 mEq/L  26  26   Calcium 8.4 - 10.5 mg/dL  9.6  10.0   Total Protein 6.0 - 8.5 g/dL 6.6  6.8    Total Bilirubin 0.0 - 1.2 mg/dL 0.4  0.6    Alkaline Phos 44 - 121 IU/L 152  128    AST 0 - 40 IU/L 14  17    ALT 0 - 32 IU/L 9  13      Renal function CrCl cannot be calculated (Patient's most recent lab result is older than the maximum 21 days allowed.).  Hgb A1c MFr Bld (%)  Date Value  02/25/2022 6.6 (H)    LDL Chol Calc (NIH)  Date Value Ref Range Status  07/17/2020 65 0 - 99 mg/dL Final   LDL Cholesterol  Date Value Ref Range Status  11/16/2021 77 0 - 99 mg/dL Final   Direct LDL  Date Value Ref Range Status  07/18/2008 145.2 mg/dL Final    Comment:    See lab report for associated comment(s)     +-------+-----------+-----------+------------+------------+  ABI/TBIToday's ABIToday's TBIPrevious ABIPrevious TBI  +-------+-----------+-----------+------------+------------+  Right 0.49       0          0.53        0             +-------+-----------+-----------+------------+------------+  Left  0.54       0          0.69        0.43          +-------+-----------+-----------+------------+------------+    Bailey Greene. Stanford Breed, MD [facility] Phone Number: [phone] 04/06/2022 1:02 PM  Total time spent on preparing this encounter including chart review, data review, collecting history, examining the patient, coordinating care for this established patient, 30 minutes  Portions of this report may have been transcribed using voice recognition software.  Every effort has been made to ensure accuracy; however, inadvertent computerized transcription errors may  still be present.

## 2022-04-07 ENCOUNTER — Ambulatory Visit: Payer: 59 | Admitting: Family

## 2022-04-07 VITALS — BP 103/73 | HR 76 | Temp 98.3°F | Resp 16 | Wt 133.0 lb

## 2022-04-07 DIAGNOSIS — Z720 Tobacco use: Secondary | ICD-10-CM

## 2022-04-07 DIAGNOSIS — I739 Peripheral vascular disease, unspecified: Secondary | ICD-10-CM

## 2022-04-07 DIAGNOSIS — F411 Generalized anxiety disorder: Secondary | ICD-10-CM | POA: Diagnosis not present

## 2022-04-07 NOTE — Progress Notes (Signed)
Subjective:   By signing my name below, I, Bailey Greene, attest that this documentation has been prepared under the direction and in the presence of Karie Chimera, NP 04/07/2022   Patient ID: Bailey Greene, female    DOB: 1960/12/26, 61 y.o.   MRN: [id number]  Chief Complaint  Patient presents with   Anxiety    Here for increased anxiety   Leg Pain    Complains of right leg pain    Anxiety    Leg Pain    Patient is in today for an office visit   Anxiety: She reports that her anxiety is worsening. She has an appointment with Dr. Clovis Pu scheduled for 04/18/2022. She has been trying to get a sooner appointment with Dr.Cottle. She has a therapist but is trying to schedule an appointment with a therapist in the same system as [place]. Her health and her work are primary factors to her anxiety. She has an increased workload which is causing her panic attacks. She is currently taking 30 mg of Buspar, 500 mg of Depakote ER three times a day, and 20 mg of Zyprexa.   Right Leg Pain: Patient presents with right leg pain. She was seen by Dr.Hawken on 04/06/2022. She was prescribed 100 mg Cilostazol for her symptoms but have not picked them up from her pharmacy yet.   Smoking: She has tried nicotine patches in the past and it did alleviate her symptoms but it has been a while. She is smoking 1 or 2 cigarettes a day.   Health Maintenance Due  Topic Date Due   INFLUENZA VACCINE  12/29/2021   MAMMOGRAM  03/11/2022    Past Medical History:  Diagnosis Date   Anxiety    Bipolar affective disorder ([place])    CAD (coronary artery disease) 2006   CABG w/ LIMA-LAD, RIMA-Diag, SVG-OM1-OM2, R radial-PDA   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   Qualifier: Diagnosis of  By: Garen Grams     Diabetes mellitus type 2 in nonobese ([place]) 07/2016   Dyslipidemia    Headache(784.0)    History of pulmonary embolism 07/08/2020   HTN (hypertension)    Migraine    NSTEMI (non-ST elevated myocardial  infarction) ([place])    PAD (peripheral artery disease) ([place])    Pulmonary embolus ([place])    unprovoked    Past Surgical History:  Procedure Laterality Date   BUNIONECTOMY  08/2011   right foot   CARDIAC CATHETERIZATION  2007   severe native 3 v dz, all grafts patent (LIMA-LAD, RIMA-Diag, SVG-OM1-OM2, R radial-PDA)   CESAREAN SECTION  1984   CORONARY ARTERY BYPASS GRAFT  2006    Coronary artery bypass grafting x5 with a left  internal  mammary to the left anterior descending coronary artery.  Free right  internal mammary to the diagonal coronary artery, sequential reverse  saphenous vein graft to the first and second obtuse marginal, right  artery bypass to the posterior descending coronary artery with endo-vein harvesting.   LEFT HEART CATH AND CORS/GRAFTS ANGIOGRAPHY N/A 05/05/2021   Procedure: LEFT HEART CATH AND CORS/GRAFTS ANGIOGRAPHY;  Surgeon: Leonie Man, MD;  Location: [facility];  Service: Cardiovascular;  Laterality: N/A;   UMBILICAL HERNIA REPAIR N/A 01/16/2021   Procedure: UMBILICAL  HERNIA REPAIR;  Surgeon: Dwan Bolt, MD;  Location: [facility];  Service: General;  Laterality: N/A;    Family History  Problem Relation Age of Onset   Lupus Mother    Heart attack Father  Diabetes Father    Hypertension Father    Diabetes Paternal Grandmother    Hypertension Paternal Grandmother    Stroke Neg Hx    Kidney disease Neg Hx    Hyperlipidemia Neg Hx    Sudden death Neg Hx     Social History   Socioeconomic History   Marital status: Legally Separated    Spouse name: Not on file   Number of children: Not on file   Years of education: Not on file   Highest education level: Not on file  Occupational History   Occupation: mortgage loan specialist  Tobacco Use   Smoking status: Some Days    Years: 25.00    Types: Cigarettes   Smokeless tobacco: Never   Tobacco comments:    0.5 pack cigarettes  per month per patient   Vaping Use   Vaping Use: Never used   Substance and Sexual Activity   Alcohol use: No    Alcohol/week: 0.0 standard drinks of alcohol   Drug use: Never   Sexual activity: Yes    Partners: Male    Comment: married  Other Topics Concern   Not on file  Social History Narrative   Regular exercise:  3 days weekly   Caffeine Use:  1 soda daily   One child biological daughter born in [birth year] and an adopted niece.   [place] specialist   Married- may be divorcing.          Social Determinants of Health   Financial Resource Strain: Not on file  Food Insecurity: Not on file  Transportation Needs: Not on file  Physical Activity: Not on file  Stress: Not on file  Social Connections: Not on file  Intimate Partner Violence: Not on file    Outpatient Medications Prior to Visit  Medication Sig Dispense Refill   ACCU-CHEK GUIDE test strip USE UP TO FOUR TIMES DAILY AS DIRECTED 400 strip 6   Accu-Chek Softclix Lancets lancets USE UP TO FOUR TIMES DAILY AS DIRECTED 100 each 12   ALPRAZolam (XANAX) 0.5 MG tablet TAKE 2 TABLETS BY MOUTH TWICE A DAY AS NEEDED FOR ANXIETY 60 tablet 3   aspirin 81 MG EC tablet Take 1 tablet (81 mg total) by mouth daily. Swallow whole. 30 tablet 12   atorvastatin (LIPITOR) 80 MG tablet Take 1 tablet (80 mg total) by mouth daily. 90 tablet 3   blood glucose meter kit and supplies KIT Dispense based on patient and insurance preference. Use up to four times daily as directed. (FOR ICD-9 250.00, 250.01). 1 each 0   busPIRone (BUSPAR) 30 MG tablet Take 1 tablet (30 mg total) by mouth 2 (two) times daily. 180 tablet 1   cilostazol (PLETAL) 100 MG tablet Take 1 tablet (100 mg total) by mouth 2 (two) times daily before a meal. 60 tablet 11   clopidogrel (PLAVIX) 75 MG tablet TAKE 1 TABLET BY MOUTH EVERY DAY WITH BREAKFAST 90 tablet 3   Continuous Blood Gluc Receiver (DEXCOM G6 RECEIVER) DEVI Use as directed 1 each 1   Continuous Blood Gluc Sensor (DEXCOM G6 SENSOR) MISC Use as directed 1 each 1    Continuous Blood Gluc Transmit (DEXCOM G6 TRANSMITTER) MISC Use as directed 1 each 1   dapagliflozin propanediol (FARXIGA) 10 MG TABS tablet Take 1 tablet (10 mg total) by mouth daily before breakfast. 30 tablet 11   divalproex (DEPAKOTE ER) 500 MG 24 hr tablet Take 3 tablets (1,500 mg total) by mouth at  bedtime. 270 tablet 1   Glycerin-Hypromellose-PEG 400 (VISINE DRY EYE OP) Place 1 drop into both eyes 3 (three) times daily as needed (dry eyes).     Insulin Glargine (BASAGLAR KWIKPEN) 100 UNIT/ML INJECT 12 UNITS INTO THE SKIN DAILY 15 mL 3   Insulin Pen Needle (BD PEN NEEDLE NANO 2ND GEN) 32G X 4 MM MISC USE AS DIRECTED 100 each 3   isosorbide mononitrate (IMDUR) 120 MG 24 hr tablet Take 1 tablet (120 mg total) by mouth daily. 90 tablet 3   JANUVIA 50 MG tablet TAKE 1 TABLET BY MOUTH EVERY DAY 90 tablet 1   lisinopril (ZESTRIL) 10 MG tablet TAKE 1 TABLET BY MOUTH EVERY DAY 90 tablet 3   metoprolol succinate (TOPROL-XL) 25 MG 24 hr tablet TAKE 1 TABLET BY MOUTH EVERY DAY *KEEP APPOINTMENT FOR REFILLS* 90 tablet 3   mupirocin ointment (BACTROBAN) 2 % Apply 1 application. topically 2 (two) times daily. 30 g 2   nitroGLYCERIN (NITROSTAT) 0.4 MG SL tablet PLACE 1 TABLET UNDER THE TONGUE EVERY 5 MINUTES AS NEEDED FOR CHOEST PAIN 25 tablet 1   OLANZapine (ZYPREXA) 20 MG tablet Take 1 tablet (20 mg total) by mouth at bedtime. 30 tablet 1   PARoxetine (PAXIL) 20 MG tablet TAKE 2 TABLETS BY MOUTH EVERY DAY 180 tablet 0   QUEtiapine (SEROQUEL) 25 MG tablet 1 daily     ranolazine (RANEXA) 500 MG 12 hr tablet Take 1 tablet (500 mg total) by mouth 2 (two) times daily. 180 tablet 3   XIIDRA 5 % SOLN Place 1 drop into both eyes in the morning and at bedtime.     fluconazole (DIFLUCAN) 150 MG tablet TAKE 1 TAB BY MOUTH TODAY AND THEN REPEAT IN 3 DAYS 2 tablet 0   No facility-administered medications prior to visit.    Allergies  Allergen Reactions   Metformin And Related Nausea And Vomiting     ROS See HPI    Objective:    Physical Exam Constitutional:      General: She is not in acute distress.    Appearance: Normal appearance. She is not ill-appearing.  HENT:     Head: Normocephalic and atraumatic.     Right Ear: External ear normal.     Left Ear: External ear normal.  Eyes:     Extraocular Movements: Extraocular movements intact.     Pupils: Pupils are equal, round, and reactive to light.  Cardiovascular:     Rate and Rhythm: Normal rate and regular rhythm.     Heart sounds: Normal heart sounds. No murmur heard.    No gallop.  Pulmonary:     Effort: Pulmonary effort is normal. No respiratory distress.     Breath sounds: Normal breath sounds. No wheezing or rales.  Skin:    General: Skin is warm and dry.  Neurological:     Mental Status: She is alert and oriented to person, place, and time.  Psychiatric:        Mood and Affect: Mood normal.        Behavior: Behavior normal.        Judgment: Judgment normal.     BP 103/73 (BP Location: Right Arm, Patient Position: Sitting, Cuff Size: Small)   Pulse 76   Temp 98.3 F (36.8 C) (Oral)   Resp 16   Wt 133 lb (60.3 kg)   LMP 11/28/2009   SpO2 100%   BMI 22.83 kg/m  Wt Readings from Last 3 Encounters:  04/07/22  133 lb (60.3 kg)  04/06/22 134 lb 1.6 oz (60.8 kg)  02/12/22 134 lb (60.8 kg)       Assessment & Plan:   Problem List Items Addressed This Visit       Unprioritized   Tobacco abuse    Smoking 1-2 cigarettes a day. Recommended that she begin nicotine gum in place of cigarettes when she is ready to quit.       PVD (peripheral vascular disease) ([place])    Saw vascular yesterday who recommended that she begin pletal and quit smoking.       Anxiety state - Primary    Anxiety is very uncontrolled.  She feels unable to work due to the anxiety.  Her psychiatry appointment is on 11/19 and she is on their cancellation list. I have given her a letter out of work until she can been seen by her  psychiatrist. Will defer FMLA for her mental health to her psychiatrist as he is managing her complicated medical regimen.  I advised her to also schedule an appointment with a therapist.         No orders of the defined types were placed in this encounter.   I, Nance Pear, NP, personally preformed the services described in this documentation.  All medical record entries made by the scribe were at my direction and in my presence.  I have reviewed the chart and discharge instructions (if applicable) and agree that the record reflects my personal performance and is accurate and complete. 04/07/2022   I,Amber Collins,acting as a scribe for Nance Pear, NP.,have documented all relevant documentation on the behalf of Nance Pear, NP,as directed by  Nance Pear, NP while in the presence of Nance Pear, NP.    Nance Pear, NP

## 2022-04-07 NOTE — Assessment & Plan Note (Addendum)
Anxiety is very uncontrolled.  She feels unable to work due to the anxiety.  Her psychiatry appointment is on 11/19 and she is on their cancellation list. I have given her a letter out of work until she can been seen by her psychiatrist. Will defer FMLA for her mental health to her psychiatrist as he is managing her complicated medical regimen.  I advised her to also schedule an appointment with a therapist.

## 2022-04-07 NOTE — Assessment & Plan Note (Signed)
Smoking 1-2 cigarettes a day. Recommended that she begin nicotine gum in place of cigarettes when she is ready to quit.

## 2022-04-07 NOTE — Assessment & Plan Note (Signed)
Saw vascular yesterday who recommended that she begin pletal and quit smoking.

## 2022-04-08 ENCOUNTER — Other Ambulatory Visit: Payer: Self-pay | Admitting: Psychiatry

## 2022-04-08 ENCOUNTER — Other Ambulatory Visit: Payer: Self-pay

## 2022-04-08 ENCOUNTER — Telehealth: Payer: Self-pay | Admitting: Psychiatry

## 2022-04-08 ENCOUNTER — Other Ambulatory Visit: Payer: Self-pay | Admitting: Cardiovascular Disease

## 2022-04-08 DIAGNOSIS — F411 Generalized anxiety disorder: Secondary | ICD-10-CM

## 2022-04-08 DIAGNOSIS — F3162 Bipolar disorder, current episode mixed, moderate: Secondary | ICD-10-CM

## 2022-04-08 DIAGNOSIS — F4001 Agoraphobia with panic disorder: Secondary | ICD-10-CM

## 2022-04-08 MED ORDER — ALPRAZOLAM 0.5 MG PO TABS: 0.5000 mg | ORAL_TABLET | Freq: Three times a day (TID) | ORAL | 0 refills | Status: DC | PRN

## 2022-04-08 NOTE — Telephone Encounter (Signed)
Increase paroxetine to 3 of the 20 mg tablets daily for panic.  Make sure she is consistent with olanzapine.  Use Xanax prn until paroxetine helps more

## 2022-04-08 NOTE — Telephone Encounter (Signed)
Pt informed

## 2022-04-08 NOTE — Telephone Encounter (Signed)
Pt called earlier this morning and moved appt up to next week, but would like someone to call her back to let her know if she should continue to take her meds until her visit.

## 2022-04-08 NOTE — Telephone Encounter (Signed)
Pt stated she has had increased anxiety and frequent panic attacks.She has an appt next week and wants to know if meds should be adjusted before then

## 2022-04-13 ENCOUNTER — Encounter (HOSPITAL_COMMUNITY)
Admission: RE | Admit: 2022-04-13 | Discharge: 2022-04-13 | Disposition: A | Discharge status: Home / Self Care | Payer: 59 | Source: Ambulatory Visit | Attending: Family | Admitting: Family

## 2022-04-13 ENCOUNTER — Other Ambulatory Visit: Payer: Self-pay

## 2022-04-13 DIAGNOSIS — R748 Abnormal levels of other serum enzymes: Secondary | ICD-10-CM | POA: Insufficient documentation

## 2022-04-13 DIAGNOSIS — I739 Peripheral vascular disease, unspecified: Secondary | ICD-10-CM

## 2022-04-13 MED ORDER — TECHNETIUM TC 99M MEDRONATE IV KIT
20.0000 | PACK | Freq: Once | INTRAVENOUS | Status: AC | PRN
  Administered 2022-04-13: 19.9 via INTRAVENOUS

## 2022-04-14 ENCOUNTER — Ambulatory Visit (INDEPENDENT_AMBULATORY_CARE_PROVIDER_SITE_OTHER): Payer: 59 | Admitting: Psychiatry

## 2022-04-14 ENCOUNTER — Encounter: Payer: Self-pay | Admitting: Psychiatry

## 2022-04-14 DIAGNOSIS — F3162 Bipolar disorder, current episode mixed, moderate: Secondary | ICD-10-CM | POA: Diagnosis not present

## 2022-04-14 DIAGNOSIS — F5105 Insomnia due to other mental disorder: Secondary | ICD-10-CM

## 2022-04-14 DIAGNOSIS — F411 Generalized anxiety disorder: Secondary | ICD-10-CM | POA: Diagnosis not present

## 2022-04-14 DIAGNOSIS — F4001 Agoraphobia with panic disorder: Secondary | ICD-10-CM

## 2022-04-14 MED ORDER — ALPRAZOLAM 0.5 MG PO TABS: 0.5000 mg | ORAL_TABLET | Freq: Three times a day (TID) | ORAL | 0 refills | Status: DC | PRN

## 2022-04-14 MED ORDER — PAROXETINE HCL 20 MG PO TABS: 60.0000 mg | ORAL_TABLET | Freq: Every day | ORAL | 0 refills | Status: DC

## 2022-04-14 NOTE — Patient Instructions (Signed)
[facility] see if [name] is available.

## 2022-04-14 NOTE — Progress Notes (Signed)
Bailey Greene [id number] September 23, 1960 61 y.o.   Subjective:   Patient ID:  Bailey Greene is a 61 y.o. (DOB [birth year]) female.  Chief Complaint:  Chief Complaint  Patient presents with   Follow-up    Moderate mixed bipolar I disorder ([place])   Anxiety    Anxiety Symptoms include nervous/anxious behavior. Patient reports no decreased concentration, dizziness, nausea or palpitations.     Bailey Greene presents for follow-up of bipolar disorder and panic attacks and general anxiety.  visit 5/22,2020.  Increased Depakote then to 1500 mg daily.  Boss rec LOA for 4 weeks.  Going through separation is really tough.  Panic attacks at work interfering.  Had this job 10 years.  Likes the job but hard to concentrate and stay focused.  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  Panic incr pulse and SOB, fear, sweating and shakey.  Panic lasts 20 mins and increased frequency.  Several in a day.  Going on for 2 mos but getting worse.  Boss can tell from listening to her calls and drop in production.  At follow up visit November 22, 2018.  Mood swings are better.  Trouble staying asleep.  Caffeine 1 coffee and 1 soda daily.She was still having severe anxiety plus panic.  We discussed the risk of SSRIs trickling triggering mood swings but the severity of the anxiety was such that we decided to initiate fluoxetine 10 mg daily to increase to 20 mg daily.  We also were using low-dose mirtazapine to help with sleep.  seen January 18, 2019.  She was granted medical leave for panic symptoms.  She was switched to paroxetine for panic from fluoxetine.  Since she was switched from mirtazapine to quetiapine 25 to 50 mg nightly for insomnia.   November 2020 visit with the following noted: It is helping some with anxiety but a lot of stress.  No unusual mood swings without a change.   She's satisfied with the 20 mg paroxetine. Sleep is much better with quetiapine and xanax at night.  5 hour and sometimes  better depending on work schedule.   No meds were changed.    10/23/2019 visit with the following noted: Anxiety, dep, panic attacks all worse lately for 2 mos.  Anger also worse mostly just at home bc manages it at work.  Several triggers.  Sister passed March 26 after illness.  Work and daughter are stressful.  Overwhelming. Not as good staying asleep.  Random panic.  Feels SOB.  Wanting to isolate. Spontaneous crying spell.s Poor concentration affecting work.    Plan: Increase paroxetine to 1-1/2 of the 20 mg tablets daily For sleep increase quetiapine to 2 the 25 mg tablets nightly  03/05/20 appt with following noted: Less anxious and sleeping is better.  Panic can be triggered at work with SOB and heart racing.  Happens about 2 times weekly. Main stress is work is overwhelming.  Talk with customers all day.   No clear mania.   Still some depression too. No SE Plan: no med changes except increase paroxetine to 40 for anxiety  09/02/2020 appt noted: Dx DM since here. Anxiety and panic worse since January.  Consistent with meds.  Stress dx DM.  Sister passed away.  Panic with SOB and occ sweats.  Panic daily. Usually before noon usually with trigger. Xanax makes her relax. 1 cup coffee AM.   Sleep 4-5 hours with quetiapine 25 mg.   And pretty consistent. Mood feels labile.  About to lose it  including irritable and angry.   Plan: Increase Quetiapine 25 mg 2 mg HS.   12/10/20 appt noted: Dizziniess for awhile after paroxetine resolved. Still having anxiety and panic but not daily.   Average 7/10 anxiety usually triggered with work as primary stress.  She and H still dealing with problems.  Panic worse either in the AM or evening. Sleep is better 4-5 hours nightly. Taking quetiapine 50 mg with Xanax at night. Still on Depakote ER 1500, busipirone 30 BID , paroxetine 40 mg daily No anger problems at work.  Appetite is better.  Regaining some weight. Evening walks helps mental health. Plan:  Start olanzapine 5 mg daily and if that is not sufficient within a week increase to 10 mg nightly.  We can go higher if needed and call if necessary. BC failure of alternatives, alprazolam 0.5 mg AM before work.   02/18/2021 appointment with the following noted: Anx down to 4-5/10.  Notices calmer with Xanax and throughout the day.Less anxiety and panic at work but getting better. Depression about the same 6/10.  Tries to distract herself from problems at home.. Sleep 6 hours now with olanzapine.   No SE Plan: increase olanzapine 15 mg HS. BC failure of alternatives, alprazolam 0.5 mg AM before work.   paroxetine to 40 mg at night.  04/28/2021 appointment with the following noted: Increase olanzapine 15 mg HS helped awhile with initial dizziness resolved.  Not drowsy. Still better than it was but holidays are hard.  Better than in a long time overall.  H saw a difference also.   Sleep 5-6 hours.  Never been a 7 hour sleeper.   Eating is normal now. Depression still there but better also 3/10.  Can enjoy some things. Better interest. Most stressful thing about work is the work load and talking with customers who  are difficult.  Had this job 13 years. Plan: Increase olanzapine 20 mg 3 hours before HS.  06/10/2021 phone call complaining of persistent insomnia.  She was allowed to increase alprazolam to 0.5 mg every morning and 1 mg nightly  06/30/2021 appointment with the following noted: Taking alprazolam 0.5 mg 2 daily usually. Increased olanzapine to 20 mg daily. CABG 2006.  Valve problem with CP and hospitalized.  Change in med. Going better now. No problems with meds.   About 6 hour sleep and in bed earlier.  Depression is OK overall but residual anxiety and crying.  Anxiety around health now too and work. Panic is not gone but better on Paroxetine 20 Sometimes brief irritab ility situationally. Plan: No med changes.  Continue olanzapine 20 mg, paroxetine 40 mg, alprazolam 0.5 mg  twice daily as needed  11/18/2021 appointment with the following noted: At one point since here anxiety got a lot worse but getting better now.  Some panic attacks including Saturday.   Compliant.  More work stress trying to get caught up.   Only taking alprazolam 0.5 mg HS and not in daytime.  Not sleepy with it daytime. No SE No depression or anxiety.   Plan: no med changes  04/08/22 TC:  increased anxiety and panic wanting med changes, so increase paroxetine to 60 mg daily  04/14/22 appt noted: Current meds alprazolam 0.5 mg 3 times daily as needed anxiety, buspirone 30 mg twice daily, Depakote ER 1500 mg nightly,, Olanzapine 20 mg nightly, quetiapine 25 mg nightly for sleep, paroxetine 60 mg as of 04/08/22 Anxiety and panic worse for a month and missed some work and  then taken out of work by PCP 03/30/22. Sx including panic with SOB. At least 2-3 times per day.  Worrying about everything she has to do including doctor's appts needs eye surgery.  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 Having to talk with customers all day triggers panic and can't talk during panic attacks. Even outside of work trouble getting things done Dt low energy and anxiety.  Everything closing in at one tgime and trouble breathing.  Feel overwhelmed all the time.   Also depression and has to push herself to go out of the house and low energy. Sleep is about 5-6 hours per night.   Wants to stay out of work until 12/22 to give time for doctor appts and get herself together. Current job 13 years.   PDMP only shows Xanax.  She denies abusing substances  Past Psychiatric Medication Trials: Hydroxyzine, mirtazapine poor response for sleep,  Depakote,  Xanax, buspirone,  quetiapine low-dose for sleep Olanzapine 20 duloxetine, citalopram, Wellbutrin, fluoxetine, paroxetine 40  Notes and chart were reviewed with the patient regarding prior history and symptoms.  Review of Systems:  Review of Systems   Cardiovascular:  Negative for palpitations.  Gastrointestinal:  Negative for nausea.  Neurological:  Negative for dizziness and tremors.  Psychiatric/Behavioral:  Positive for dysphoric mood. Negative for decreased concentration and sleep disturbance. The patient is nervous/anxious.     Medications: I have reviewed the patient's current medications.  Current Outpatient Medications  Medication Sig Dispense Refill   ACCU-CHEK GUIDE test strip USE UP TO FOUR TIMES DAILY AS DIRECTED 400 strip 6   Accu-Chek Softclix Lancets lancets USE UP TO FOUR TIMES DAILY AS DIRECTED 100 each 12   aspirin 81 MG EC tablet Take 1 tablet (81 mg total) by mouth daily. Swallow whole. 30 tablet 12   atorvastatin (LIPITOR) 80 MG tablet TAKE 1 TABLET BY MOUTH DAILY 90 tablet 3   blood glucose meter kit and supplies KIT Dispense based on patient and insurance preference. Use up to four times daily as directed. (FOR ICD-9 250.00, 250.01). 1 each 0   busPIRone (BUSPAR) 30 MG tablet Take 1 tablet (30 mg total) by mouth 2 (two) times daily. 180 tablet 1   cilostazol (PLETAL) 100 MG tablet Take 1 tablet (100 mg total) by mouth 2 (two) times daily before a meal. 60 tablet 11   clopidogrel (PLAVIX) 75 MG tablet TAKE 1 TABLET BY MOUTH EVERY DAY WITH BREAKFAST 90 tablet 3   Continuous Blood Gluc Receiver (DEXCOM G6 RECEIVER) DEVI Use as directed 1 each 1   Continuous Blood Gluc Sensor (DEXCOM G6 SENSOR) MISC Use as directed 1 each 1   Continuous Blood Gluc Transmit (DEXCOM G6 TRANSMITTER) MISC Use as directed 1 each 1   dapagliflozin propanediol (FARXIGA) 10 MG TABS tablet Take 1 tablet (10 mg total) by mouth daily before breakfast. 30 tablet 11   divalproex (DEPAKOTE ER) 500 MG 24 hr tablet Take 3 tablets (1,500 mg total) by mouth at bedtime. 270 tablet 1   Glycerin-Hypromellose-PEG 400 (VISINE DRY EYE OP) Place 1 drop into both eyes 3 (three) times daily as needed (dry eyes).     Insulin Glargine (BASAGLAR KWIKPEN) 100  UNIT/ML INJECT 12 UNITS INTO THE SKIN DAILY 15 mL 3   Insulin Pen Needle (BD PEN NEEDLE NANO 2ND GEN) 32G X 4 MM MISC USE AS DIRECTED 100 each 3   isosorbide mononitrate (IMDUR) 120 MG 24 hr tablet Take 1 tablet (120 mg total) by  mouth daily. 90 tablet 3   JANUVIA 50 MG tablet TAKE 1 TABLET BY MOUTH EVERY DAY 90 tablet 1   lisinopril (ZESTRIL) 10 MG tablet TAKE 1 TABLET BY MOUTH EVERY DAY 90 tablet 3   metoprolol succinate (TOPROL-XL) 25 MG 24 hr tablet TAKE 1 TABLET BY MOUTH EVERY DAY *KEEP APPOINTMENT FOR REFILLS* 90 tablet 3   mupirocin ointment (BACTROBAN) 2 % Apply 1 application. topically 2 (two) times daily. 30 g 2   nitroGLYCERIN (NITROSTAT) 0.4 MG SL tablet PLACE 1 TABLET UNDER THE TONGUE EVERY 5 MINUTES AS NEEDED FOR CHOEST PAIN 25 tablet 1   OLANZapine (ZYPREXA) 20 MG tablet Take 1 tablet (20 mg total) by mouth at bedtime. 30 tablet 1   QUEtiapine (SEROQUEL) 25 MG tablet 1 daily     ranolazine (RANEXA) 500 MG 12 hr tablet Take 1 tablet (500 mg total) by mouth 2 (two) times daily. 180 tablet 3   XIIDRA 5 % SOLN Place 1 drop into both eyes in the morning and at bedtime.     ALPRAZolam (XANAX) 0.5 MG tablet Take 1 tablet (0.5 mg total) by mouth 3 (three) times daily as needed for anxiety. TAKE 2 TABLETS BY MOUTH TWICE A DAY AS NEEDED FOR ANXIETY 90 tablet 0   PARoxetine (PAXIL) 20 MG tablet Take 3 tablets (60 mg total) by mouth daily. As of 04/08/22 270 tablet 0   No current facility-administered medications for this visit.    Medication Side Effects: ? 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   Qualifier: Diagnosis of  By: Garen Grams     Diabetes mellitus type 2 in nonobese ([place]) 07/2016    Dyslipidemia    Headache(784.0)    History of pulmonary embolism 07/08/2020   HTN (hypertension)    Migraine    NSTEMI (non-ST elevated myocardial infarction) ([place])    PAD (peripheral artery disease) ([place])    Pulmonary embolus ([facility])    unprovoked    Family History  Problem Relation Age of Onset   Lupus Mother    Heart attack Father    Diabetes Father    Hypertension Father    Diabetes Paternal Grandmother    Hypertension Paternal Grandmother    Stroke Neg Hx    Kidney disease Neg Hx    Hyperlipidemia Neg Hx    Sudden death Neg Hx     Social History   Socioeconomic History   Marital status: Legally Separated    Spouse name: Not on file   Number of children: Not on file   Years of education: Not on file   Highest education level: Not on file  Occupational History   Occupation: mortgage loan specialist  Tobacco Use   Smoking status: Some Days    Years: 25.00    Types: Cigarettes   Smokeless tobacco: Never   Tobacco comments:    0.5 pack cigarettes  per month per patient   Vaping Use   Vaping Use: Never used  Substance and Sexual Activity   Alcohol use: No    Alcohol/week: 0.0 standard drinks of alcohol   Drug use: Never   Sexual activity: Yes    Partners: Male  Comment: married  Other Topics Concern   Not on file  Social History Narrative   Regular exercise:  3 days weekly   Caffeine Use:  1 soda daily   One child biological daughter born in 11 and an adopted niece.   [employer] specialist   Married- may be divorcing.          Social Determinants of Health   Financial Resource Strain: Not on file  Food Insecurity: Not on file  Transportation Needs: Not on file  Physical Activity: Not on file  Stress: Not on file  Social Connections: Not on file  Intimate Partner Violence: Not on file    Past Medical History, Surgical history, Social history, and Family history were reviewed and updated as appropriate.   Please see review of  systems for further details on the patient's review from today.   Objective:   Physical Exam:  LMP 11/28/2009   Physical Exam Constitutional:      General: She is not in acute distress.    Appearance: She is well-developed.  Musculoskeletal:        General: No deformity.  Neurological:     Mental Status: She is alert and oriented to person, place, and time.     Coordination: Coordination normal.  Psychiatric:        Attention and Perception: She is attentive. She does not perceive auditory hallucinations.        Mood and Affect: Mood is anxious and depressed. Affect is not labile, blunt, angry or tearful.        Speech: Speech normal. Speech is not rapid and pressured.        Behavior: Behavior normal. Behavior is not agitated.        Thought Content: Thought content normal. Thought content is not paranoid or delusional. Thought content does not include homicidal or suicidal ideation.        Cognition and Memory: Cognition normal.        Judgment: Judgment normal.     Comments: Insight intact. No auditory or visual hallucinations. No delusions.  Work stress     Lab Review:     Component Value Date/Time   NA 138 02/25/2022 0950   NA 141 07/23/2020 1300   K 4.4 02/25/2022 0950   CL 106 02/25/2022 0950   CO2 26 02/25/2022 0950   GLUCOSE 89 02/25/2022 0950   BUN 18 02/25/2022 0950   BUN 7 07/23/2020 1300   CREATININE 1.17 02/25/2022 0950   CREATININE 0.72 06/22/2012 0840   CALCIUM 9.6 02/25/2022 0950   PROT 6.6 03/29/2022 1504   ALBUMIN 4.6 03/29/2022 1504   AST 14 03/29/2022 1504   ALT 9 03/29/2022 1504   ALKPHOS 152 (H) 03/29/2022 1504   BILITOT 0.4 03/29/2022 1504   GFRNONAA >60 05/06/2021 0351   GFRNONAA >89 06/22/2012 0840   GFRAA 102 07/23/2020 1300   GFRAA >89 06/22/2012 0840       Component Value Date/Time   WBC 6.1 02/25/2022 0950   RBC 3.99 02/25/2022 0950   HGB 12.3 02/25/2022 0950   HCT 36.5 02/25/2022 0950   PLT 187.0 02/25/2022 0950   MCV 91.4  02/25/2022 0950   MCH 29.6 05/07/2021 0149   MCHC 33.8 02/25/2022 0950   RDW 14.4 02/25/2022 0950   LYMPHSABS 2.2 02/25/2022 0950   MONOABS 0.4 02/25/2022 0950   EOSABS 0.3 02/25/2022 0950   BASOSABS 0.0 02/25/2022 0950    No results found for: "POCLITH", "LITHIUM"  No results found for: "PHENYTOIN", "PHENOBARB", "VALPROATE", "CBMZ"   .res Assessment: Plan:    Bailey Greene was seen today for follow-up and anxiety.  Diagnoses and all orders for this visit:  Moderate mixed bipolar I disorder ([facility])  Panic disorder with agoraphobia -     ALPRAZolam (XANAX) 0.5 MG tablet; Take 1 tablet (0.5 mg total) by mouth 3 (three) times daily as needed for anxiety. TAKE 2 TABLETS BY MOUTH TWICE A DAY AS NEEDED FOR ANXIETY -     PARoxetine (PAXIL) 20 MG tablet; Take 3 tablets (60 mg total) by mouth daily. As of 04/08/22  Generalized anxiety disorder -     PARoxetine (PAXIL) 20 MG tablet; Take 3 tablets (60 mg total) by mouth daily. As of 04/08/22  Insomnia due to mental condition  Chronic work stress  Greater than 50% of 30 min face to face time with patient was spent on counseling and coordination of care. We discussed the following: she and her husband have both noted significant improvement and her level of anxiety and she is less depressed with the addition of olanzapine and the increased to 20 mg daily.  It is also a mood stabilizer.  It also can help with depression when added to an SSRI.  We discussed side effects in detail.  This is the usual max dose. Consider further increase olanzapine if necessary Continue olanzapine 20 mg 3 hours before HS. Continue Depakote ER 1500 mg HS Buspirone 30 BID Increased paroxetine to 60 mg daily as of 04/08/22 for worsening panic.  BC failure of alternatives, alprazolam 0.5 mg AM before work twice daily.  Alternatives clonazepam, Ativan  Discussed potential metabolic side effects associated with atypical antipsychotics, as well as potential risk for  movement side effects. Advised pt to contact office if movement side effects occur.  Metabolic side effects are unlikely at this low dose of quetiapine and almost no risk of EPS at this low-dose. DM managed  We discussed the short-term risks associated with benzodiazepines including sedation and increased fall risk among others.  Discussed long-term side effect risk including dependence, potential withdrawal symptoms, and the potential eventual dose-related risk of dementia.  But recent studies from 2020 dispute this association between benzodiazepines and dementia risk. Newer studies in 2020 do not support an association with dementia. If too sleepy with it call and will call if needed to switch to lorazepam.  Needs counseling [facility] to [facility]  This appt was 30 mins.   Follow-up Dec  Lynder Parents MD, DFAPA  Please see After Visit Summary for patient specific instructions.   Future Appointments  Date Time Provider [place]  05/04/2022  3:40 PM Minus Breeding, MD [facility] None  05/17/2022  2:30 PM Cottle, Billey Co., MD [facility] None  07/06/2022  1:00 PM [name], NP [facility]  07/20/2022  1:00 PM [facility] 3 - EM [facility]  07/20/2022  1:20 PM Cherre Robins, MD [facility]    No orders of the defined types were placed in this encounter.      -------------------------------

## 2022-04-15 ENCOUNTER — Telehealth: Payer: Self-pay | Admitting: Family

## 2022-04-15 DIAGNOSIS — R748 Abnormal levels of other serum enzymes: Secondary | ICD-10-CM

## 2022-04-15 NOTE — Telephone Encounter (Signed)
Please advise pt that her bone scan is normal other than note of some arthritis.  I would like to check her vitamin D level please.  If her vitamin D level is low it may be another explanation for why her alk phos level has been elevated.  

## 2022-04-16 ENCOUNTER — Telehealth: Payer: Self-pay | Admitting: Psychiatry

## 2022-04-16 DIAGNOSIS — Z0289 Encounter for other administrative examinations: Secondary | ICD-10-CM

## 2022-04-16 NOTE — Telephone Encounter (Signed)
Patient advised of results and scheduled to come in Monday for labs

## 2022-04-16 NOTE — Telephone Encounter (Signed)
Paper work faxed with office note per request

## 2022-04-16 NOTE — Telephone Encounter (Signed)
Received fax from [facility] for completion of Provider Statement. Placed in Traci's box to complete

## 2022-04-16 NOTE — Telephone Encounter (Signed)
Form completed and will have Dr. Jennelle Human review and sign

## 2022-04-19 ENCOUNTER — Other Ambulatory Visit: Payer: 59

## 2022-04-20 ENCOUNTER — Ambulatory Visit: Payer: 59 | Admitting: Psychiatry

## 2022-04-21 NOTE — Telephone Encounter (Signed)
Someone from [employer] LVM @ 9:21a.   She said you don't have to speak to anyone specifically when you call back, but they need someone to call back with "objective evidence of what is keeping the pt from performing her essential job functions".  Next appt 12/18

## 2022-04-25 ENCOUNTER — Other Ambulatory Visit: Payer: Self-pay | Admitting: Family

## 2022-04-27 ENCOUNTER — Other Ambulatory Visit: Payer: Self-pay

## 2022-04-27 ENCOUNTER — Other Ambulatory Visit (INDEPENDENT_AMBULATORY_CARE_PROVIDER_SITE_OTHER): Payer: 59

## 2022-04-27 ENCOUNTER — Telehealth: Payer: Self-pay | Admitting: Family

## 2022-04-27 DIAGNOSIS — R748 Abnormal levels of other serum enzymes: Secondary | ICD-10-CM

## 2022-04-27 DIAGNOSIS — E559 Vitamin D deficiency, unspecified: Secondary | ICD-10-CM

## 2022-04-27 LAB — VITAMIN D 25 HYDROXY (VIT D DEFICIENCY, FRACTURES): VITD: 17.37 ng/mL — ABNORMAL LOW (ref 30.00–100.00)

## 2022-04-27 LAB — GAMMA GT: GGT: 33 U/L (ref 7–51)

## 2022-04-27 MED ORDER — VITAMIN D (ERGOCALCIFEROL) 1.25 MG (50000 UNIT) PO CAPS: 50000.0000 [IU] | ORAL_CAPSULE | ORAL | 0 refills | Status: DC

## 2022-04-27 NOTE — Telephone Encounter (Signed)
Vitamin D level is low.  Advise patient to begin vit D 50000 units once weekly for 12 weeks, then repeat vit D level (dx Vit D deficiency).     

## 2022-04-27 NOTE — Telephone Encounter (Signed)
Patient advised of results and provider's comments. She was scheduled to return for vit d check 07/26/22

## 2022-05-02 NOTE — Progress Notes (Unsigned)
Cardiology Office Note   Date:  05/02/2022   ID:  Bailey Greene, DOB 01-18-1961, MRN [id number]  PCP:  [name], NP  Cardiologist:   Minus Breeding, MD   No chief complaint on file.     History of Present Illness: Bailey Greene is a 61 y.o. female who presents for followup of a history of coronary artery disease status post CABG x5 in 2006 followup cath in 2007 showed patent grafts and improved LV function EF 65%. She had a normal stress test in May 2012 and again in July 2016 and again in March of 2018.   She had a low risk perfusion study in Feb 2019.   She was in the hospital  05/05/2021 with NSTEMI with troponin peaking at 3359.  Cardiac catheterization showed severe multivessel native CAD, 3 out of 5 grafts patent (LIMA-LAD, free RIMA-diagonal, left radial-RPDA) culprit lesion felt to be 100% occluded SVG-OM-OM2 with patent OM1-OM2 limb.  She was recommended for medical therapy.    Since I last saw her ***   ***  she is doing better on 120 mg of Imdur which I increased last time.  She has continued to get some chest discomfort with emotional stress at work or if she walks 1/2-hour.  She is not having any resting complaints.  She thinks she has had to take about 2 nitroglycerin since I last saw her.  She has coronary disease as below and we are managing this medically for small vessel and branch vessel disease.     Lab Results  Component Value Date   TSH 0.68 02/25/2022      Past Medical History:  Diagnosis Date   Anxiety    Bipolar affective disorder ([place])    CAD (coronary artery disease) 2006   CABG w/ LIMA-LAD, RIMA-Diag, SVG-OM1-OM2, R radial-PDA   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   Qualifier: Diagnosis of  By: Garen Grams     Diabetes mellitus type 2 in nonobese ([place]) 07/2016   Dyslipidemia    Headache(784.0)    History of pulmonary embolism 07/08/2020   HTN (hypertension)    Migraine    NSTEMI (non-ST elevated myocardial infarction) ([place])     PAD (peripheral artery disease) ([place])    Pulmonary embolus ([place])    unprovoked    Past Surgical History:  Procedure Laterality Date   BUNIONECTOMY  08/2011   right foot   CARDIAC CATHETERIZATION  2007   severe native 3 v dz, all grafts patent (LIMA-LAD, RIMA-Diag, SVG-OM1-OM2, R radial-PDA)   CESAREAN SECTION  1984   CORONARY ARTERY BYPASS GRAFT  2006    Coronary artery bypass grafting x5 with a left  internal  mammary to the left anterior descending coronary artery.  Free right  internal mammary to the diagonal coronary artery, sequential reverse  saphenous vein graft to the first and second obtuse marginal, right  artery bypass to the posterior descending coronary artery with endo-vein harvesting.   LEFT HEART CATH AND CORS/GRAFTS ANGIOGRAPHY N/A 05/05/2021   Procedure: LEFT HEART CATH AND CORS/GRAFTS ANGIOGRAPHY;  Surgeon: Leonie Man, MD;  Location: [facility];  Service: Cardiovascular;  Laterality: N/A;   UMBILICAL HERNIA REPAIR N/A 01/16/2021   Procedure: UMBILICAL  HERNIA REPAIR;  Surgeon: Dwan Bolt, MD;  Location: [facility];  Service: General;  Laterality: N/A;     Current Outpatient Medications  Medication Sig Dispense Refill   ACCU-CHEK GUIDE test strip USE UP TO FOUR TIMES DAILY AS DIRECTED 400 strip 6  Accu-Chek Softclix Lancets lancets USE UP TO FOUR TIMES DAILY AS DIRECTED 100 each 12   ALPRAZolam (XANAX) 0.5 MG tablet Take 1 tablet (0.5 mg total) by mouth 3 (three) times daily as needed for anxiety. TAKE 2 TABLETS BY MOUTH TWICE A DAY AS NEEDED FOR ANXIETY 90 tablet 0   aspirin 81 MG EC tablet Take 1 tablet (81 mg total) by mouth daily. Swallow whole. 30 tablet 12   atorvastatin (LIPITOR) 80 MG tablet TAKE 1 TABLET BY MOUTH DAILY 90 tablet 3   BD PEN NEEDLE NANO U/F 32G X 4 MM MISC USE AS DIRECTED 300 each 1   blood glucose meter kit and supplies KIT Dispense based on patient and insurance preference. Use up to four times daily as directed. (FOR ICD-9 250.00,  250.01). 1 each 0   busPIRone (BUSPAR) 30 MG tablet Take 1 tablet (30 mg total) by mouth 2 (two) times daily. 180 tablet 1   cilostazol (PLETAL) 100 MG tablet Take 1 tablet (100 mg total) by mouth 2 (two) times daily before a meal. 60 tablet 11   clopidogrel (PLAVIX) 75 MG tablet TAKE 1 TABLET BY MOUTH EVERY DAY WITH BREAKFAST 90 tablet 3   Continuous Blood Gluc Receiver (DEXCOM G6 RECEIVER) DEVI Use as directed 1 each 1   Continuous Blood Gluc Sensor (DEXCOM G6 SENSOR) MISC Use as directed 1 each 1   Continuous Blood Gluc Transmit (DEXCOM G6 TRANSMITTER) MISC Use as directed 1 each 1   dapagliflozin propanediol (FARXIGA) 10 MG TABS tablet Take 1 tablet (10 mg total) by mouth daily before breakfast. 30 tablet 11   divalproex (DEPAKOTE ER) 500 MG 24 hr tablet Take 3 tablets (1,500 mg total) by mouth at bedtime. 270 tablet 1   Glycerin-Hypromellose-PEG 400 (VISINE DRY EYE OP) Place 1 drop into both eyes 3 (three) times daily as needed (dry eyes).     Insulin Glargine (BASAGLAR KWIKPEN) 100 UNIT/ML INJECT 12 UNITS INTO THE SKIN DAILY 15 mL 3   isosorbide mononitrate (IMDUR) 120 MG 24 hr tablet Take 1 tablet (120 mg total) by mouth daily. 90 tablet 3   JANUVIA 50 MG tablet TAKE 1 TABLET BY MOUTH EVERY DAY 90 tablet 1   lisinopril (ZESTRIL) 10 MG tablet TAKE 1 TABLET BY MOUTH EVERY DAY 90 tablet 3   metoprolol succinate (TOPROL-XL) 25 MG 24 hr tablet TAKE 1 TABLET BY MOUTH EVERY DAY *KEEP APPOINTMENT FOR REFILLS* 90 tablet 3   mupirocin ointment (BACTROBAN) 2 % Apply 1 application. topically 2 (two) times daily. 30 g 2   nitroGLYCERIN (NITROSTAT) 0.4 MG SL tablet PLACE 1 TABLET UNDER THE TONGUE EVERY 5 MINUTES AS NEEDED FOR CHOEST PAIN 25 tablet 1   OLANZapine (ZYPREXA) 20 MG tablet Take 1 tablet (20 mg total) by mouth at bedtime. 30 tablet 1   PARoxetine (PAXIL) 20 MG tablet Take 3 tablets (60 mg total) by mouth daily. As of 04/08/22 270 tablet 0   QUEtiapine (SEROQUEL) 25 MG tablet 1 daily      ranolazine (RANEXA) 500 MG 12 hr tablet Take 1 tablet (500 mg total) by mouth 2 (two) times daily. 180 tablet 3   Vitamin D, Ergocalciferol, (DRISDOL) 1.25 MG (50000 UNIT) CAPS capsule Take 1 capsule (50,000 Units total) by mouth every 7 (seven) days. 12 capsule 0   XIIDRA 5 % SOLN Place 1 drop into both eyes in the morning and at bedtime.     No current facility-administered medications for this visit.  Allergies:   Metformin and related    ROS:  Please see the history of present illness.   Otherwise, review of systems are positive for ***.   All other systems are reviewed and negative.    PHYSICAL EXAM: VS:  LMP 11/28/2009  , BMI There is no height or weight on file to calculate BMI. GENERAL:  Well appearing NECK:  No jugular venous distention, waveform within normal limits, carotid upstroke brisk and symmetric, no bruits, no thyromegaly LUNGS:  Clear to auscultation bilaterally CHEST:  Unremarkable HEART:  PMI not displaced or sustained,S1 and S2 within normal limits, no S3, no S4, no clicks, no rubs, *** murmurs ABD:  Flat, positive bowel sounds normal in frequency in pitch, no bruits, no rebound, no guarding, no midline pulsatile mass, no hepatomegaly, no splenomegaly EXT:  2 plus pulses throughout, no edema, no cyanosis no clubbing    GENERAL:  Well appearing NECK:  No jugular venous distention, waveform within normal limits, carotid upstroke brisk and symmetric, no bruits, no thyromegaly LUNGS:  Clear to auscultation bilaterally CHEST:  Well healed sternotomy scar. HEART:  PMI not displaced or sustained,S1 and S2 within normal limits, no S3, no S4, no clicks, no rubs, no murmurs ABD:  Flat, positive bowel sounds normal in frequency in pitch, no bruits, no rebound, no guarding, no midline pulsatile mass, no hepatomegaly, no splenomegaly EXT:  2 plus pulses throughout, no edema, no cyanosis no clubbing    Cath 04/2021  Diagnostic Dominance: Co-domiant    EKG:  EKG is  *** ordered today. ***  Recent Labs: 02/25/2022: BUN 18; Creatinine, Ser 1.17; Hemoglobin 12.3; Platelets 187.0; Potassium 4.4; Sodium 138; TSH 0.68 03/29/2022: ALT 9    Lipid Panel    Component Value Date/Time   CHOL 145 11/16/2021 0948   CHOL 139 07/17/2020 1009   TRIG 101.0 11/16/2021 0948   HDL 47.60 11/16/2021 0948   HDL 58 07/17/2020 1009   CHOLHDL 3 11/16/2021 0948   VLDL 20.2 11/16/2021 0948   LDLCALC 77 11/16/2021 0948   LDLCALC 65 07/17/2020 1009   LDLDIRECT 145.2 07/18/2008 0821      Wt Readings from Last 3 Encounters:  04/07/22 133 lb (60.3 kg)  04/06/22 134 lb 1.6 oz (60.8 kg)  02/12/22 134 lb (60.8 kg)      Other studies Reviewed: Additional studies/ records that were reviewed today include: *** Review of the above records demonstrates:    Please see elsewhere in the note.     ASSESSMENT AND PLAN:  CAD:       ***  Today I am going to add Ranexa 500 mg twice daily to the regimen.  I will have her come back in a couple of months.  She will continue the other meds as listed.  She will let me know if she has accelerated pain but again she has diffuse branch vessel abnormal chronically occluded vessels originate equally.  S  DYSLIPIDEMIA:    *** She is tolerating high-dose statin and her LDL was 65.  HTN:     ***  Blood pressure was at target.  DM:   A1C was ***  7.0 which is down from a peak of 13.8.  This is being managed by her primary provider. \  TOBACCO:   ***  I have encouraged complete abstinence.  She is smoking 2 cigarettes a day or so.  PVD:   ***  She has PVD and is seen by Dr. Hawkins at [facility]  Current medicines   are reviewed at length with the patient today.  The patient does not have concerns regarding medicines.  The following changes have been made:  ***  Labs/ tests ordered today include:  ***  No orders of the defined types were placed in this encounter.    Disposition:   FU with *** months   Signed,  , MD   05/02/2022 9:01 PM    [facility]  

## 2022-05-04 ENCOUNTER — Telehealth: Payer: Self-pay | Admitting: Psychiatry

## 2022-05-04 ENCOUNTER — Ambulatory Visit: Payer: 59 | Attending: Cardiology | Admitting: Cardiology

## 2022-05-04 ENCOUNTER — Encounter: Payer: Self-pay | Admitting: Cardiology

## 2022-05-04 VITALS — BP 104/64 | HR 69 | Ht 64.0 in | Wt 135.0 lb

## 2022-05-04 DIAGNOSIS — I2510 Atherosclerotic heart disease of native coronary artery without angina pectoris: Secondary | ICD-10-CM | POA: Diagnosis not present

## 2022-05-04 DIAGNOSIS — I10 Essential (primary) hypertension: Secondary | ICD-10-CM | POA: Diagnosis not present

## 2022-05-04 DIAGNOSIS — I25118 Atherosclerotic heart disease of native coronary artery with other forms of angina pectoris: Secondary | ICD-10-CM

## 2022-05-04 DIAGNOSIS — E785 Hyperlipidemia, unspecified: Secondary | ICD-10-CM

## 2022-05-04 DIAGNOSIS — I739 Peripheral vascular disease, unspecified: Secondary | ICD-10-CM

## 2022-05-04 NOTE — Telephone Encounter (Signed)
Bailey Greene called today at 11:45.  She has reported that [name] needs further documentation of what her treatment plan is.  The FMLA paperwork has been received by them, but they are wanting further information or details of the treatment plan.  There is note from [name] from an earlier asking for this same information.  That notes says someone could just call them to provide what they need versus sending in a letter or other documentation.  They will not approve her FMLA unless they get this other information.  Contact information is the as on the paperwork already submitted.

## 2022-05-04 NOTE — Patient Instructions (Signed)
   Follow-Up: At [facility], you and your health needs are our priority.  As part of our continuing mission to provide you with exceptional heart care, we have created designated Provider Care Teams.  These Care Teams include your primary Cardiologist (physician) and Advanced Practice Providers (APPs -  Physician Assistants and Nurse Practitioners) who all work together to provide you with the care you need, when you need it.  We recommend signing up for the patient portal called "MyChart".  Sign up information is provided on this After Visit Summary.  MyChart is used to connect with patients for Virtual Visits (Telemedicine).  Patients are able to view lab/test results, encounter notes, upcoming appointments, etc.  Non-urgent messages can be sent to your provider as well.   To learn more about what you can do with MyChart, go to [email or web address].    Your next appointment:   12 month(s)  The format for your next appointment:   In Person  Provider:   James Hochrein, MD     

## 2022-05-04 NOTE — Telephone Encounter (Signed)
Noted will contact [employer]

## 2022-05-05 NOTE — Telephone Encounter (Signed)
Contacted [facility] and they didn't know what I was calling back about. Explained the pt had ONLY been seen on 04/14/2022 and next apt was 05/17/2022.  The paper work requested notes from 03/30/2022- current but that note was sent on 04/16/2022 with her other paper work.

## 2022-05-13 ENCOUNTER — Telehealth: Payer: Self-pay | Admitting: Psychiatry

## 2022-05-13 NOTE — Telephone Encounter (Signed)
Spoke with the patient over the phone. She reports a panic attack tonight that included symptoms of shortness of breath, chest pain, nausea, dizziness. This was sudden onset, similar to previous panic attacks. Due to her cardiac history she was seeking guidance on presenting to the emergency room. She took a Xanax 10 minutes prior to our conversation. Per her report her symptoms are now resolving.  I recommended waiting 15-20 minutes to monitor her symptoms. If her symptoms do not improve over that time or begin to worsen, she should present to the emergency department. If they improve this is more reassuring. She has an appointment on Monday, Dec 18 with her provider. I also recommend touching base with her cardiologist to ensure this isn't cardiac in nature.

## 2022-05-14 ENCOUNTER — Telehealth: Payer: Self-pay | Admitting: Psychiatry

## 2022-05-14 ENCOUNTER — Ambulatory Visit: Payer: 59 | Admitting: Family

## 2022-05-14 NOTE — Telephone Encounter (Signed)
Calise called this morning at 9:45 because [employer] has denied her claim because they didn't received additional information on a treatment plan.  As previously noted from an earlier call from [place] [facility] stating they needed more information, they told Traci they had all they needed.  Now they have denied the claim because they did what they needed.  Chabeli has an appt Monday.  Please address specific treatment plans and we can send those notes to [facility] and perhaps that will appease them.  We could perhaps also revised the FMLA form to update the treatment plan to match the new notes.

## 2022-05-14 NOTE — Telephone Encounter (Signed)
Noted.  I agree with recommendations.  Meredith Staggers, MD, DFAPA

## 2022-05-17 ENCOUNTER — Ambulatory Visit (INDEPENDENT_AMBULATORY_CARE_PROVIDER_SITE_OTHER): Payer: 59 | Admitting: Psychiatry

## 2022-05-17 ENCOUNTER — Encounter: Payer: Self-pay | Admitting: Psychiatry

## 2022-05-17 DIAGNOSIS — F411 Generalized anxiety disorder: Secondary | ICD-10-CM

## 2022-05-17 DIAGNOSIS — F5105 Insomnia due to other mental disorder: Secondary | ICD-10-CM | POA: Diagnosis not present

## 2022-05-17 DIAGNOSIS — F3162 Bipolar disorder, current episode mixed, moderate: Secondary | ICD-10-CM | POA: Diagnosis not present

## 2022-05-17 DIAGNOSIS — F4001 Agoraphobia with panic disorder: Secondary | ICD-10-CM | POA: Diagnosis not present

## 2022-05-17 MED ORDER — CLONIDINE HCL 0.1 MG PO TABS: ORAL_TABLET | ORAL | 0 refills | Status: DC

## 2022-05-17 MED ORDER — ALPRAZOLAM 0.5 MG PO TABS: ORAL_TABLET | ORAL | 0 refills | Status: DC

## 2022-05-17 NOTE — Progress Notes (Signed)
[name] [id number] September 23, 1960 61 y.o.   Subjective:   Patient ID:  Bailey Greene is a 61 y.o. (DOB [birth year]) female.  Chief Complaint:  Chief Complaint  Patient presents with   Follow-up    Moderate mixed bipolar I disorder ([place])   Anxiety    Anxiety Symptoms include nervous/anxious behavior. Patient reports no decreased concentration, dizziness, nausea or palpitations.     Kaeleigh Westendorf presents for follow-up of bipolar disorder and panic attacks and general anxiety.  visit 5/22,2020.  Increased Depakote then to 1500 mg daily.  Boss rec LOA for 4 weeks.  Going through separation is really tough.  Panic attacks at work interfering.  Had this job 10 years.  Likes the job but hard to concentrate and stay focused.  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  Panic incr pulse and SOB, fear, sweating and shakey.  Panic lasts 20 mins and increased frequency.  Several in a day.  Going on for 2 mos but getting worse.  Boss can tell from listening to her calls and drop in production.  At follow up visit November 22, 2018.  Mood swings are better.  Trouble staying asleep.  Caffeine 1 coffee and 1 soda daily.She was still having severe anxiety plus panic.  We discussed the risk of SSRIs trickling triggering mood swings but the severity of the anxiety was such that we decided to initiate fluoxetine 10 mg daily to increase to 20 mg daily.  We also were using low-dose mirtazapine to help with sleep.  seen January 18, 2019.  She was granted medical leave for panic symptoms.  She was switched to paroxetine for panic from fluoxetine.  Since she was switched from mirtazapine to quetiapine 25 to 50 mg nightly for insomnia.   November 2020 visit with the following noted: It is helping some with anxiety but a lot of stress.  No unusual mood swings without a change.   She's satisfied with the 20 mg paroxetine. Sleep is much better with quetiapine and xanax at night.  5 hour and sometimes  better depending on work schedule.   No meds were changed.    10/23/2019 visit with the following noted: Anxiety, dep, panic attacks all worse lately for 2 mos.  Anger also worse mostly just at home bc manages it at work.  Several triggers.  Sister passed March 26 after illness.  Work and daughter are stressful.  Overwhelming. Not as good staying asleep.  Random panic.  Feels SOB.  Wanting to isolate. Spontaneous crying spell.s Poor concentration affecting work.    Plan: Increase paroxetine to 1-1/2 of the 20 mg tablets daily For sleep increase quetiapine to 2 the 25 mg tablets nightly  03/05/20 appt with following noted: Less anxious and sleeping is better.  Panic can be triggered at work with SOB and heart racing.  Happens about 2 times weekly. Main stress is work is overwhelming.  Talk with customers all day.   No clear mania.   Still some depression too. No SE Plan: no med changes except increase paroxetine to 40 for anxiety  09/02/2020 appt noted: Dx DM since here. Anxiety and panic worse since January.  Consistent with meds.  Stress dx DM.  Sister passed away.  Panic with SOB and occ sweats.  Panic daily. Usually before noon usually with trigger. Xanax makes her relax. 1 cup coffee AM.   Sleep 4-5 hours with quetiapine 25 mg.   And pretty consistent. Mood feels labile.  About to lose it  including irritable and angry.   Plan: Increase Quetiapine 25 mg 2 mg HS.   12/10/20 appt noted: Dizziniess for awhile after paroxetine resolved. Still having anxiety and panic but not daily.   Average 7/10 anxiety usually triggered with work as primary stress.  She and H still dealing with problems.  Panic worse either in the AM or evening. Sleep is better 4-5 hours nightly. Taking quetiapine 50 mg with Xanax at night. Still on Depakote ER 1500, busipirone 30 BID , paroxetine 40 mg daily No anger problems at work.  Appetite is better.  Regaining some weight. Evening walks helps mental health. Plan:  Start olanzapine 5 mg daily and if that is not sufficient within a week increase to 10 mg nightly.  We can go higher if needed and call if necessary. BC failure of alternatives, alprazolam 0.5 mg AM before work.   02/18/2021 appointment with the following noted: Anx down to 4-5/10.  Notices calmer with Xanax and throughout the day.Less anxiety and panic at work but getting better. Depression about the same 6/10.  Tries to distract herself from problems at home.. Sleep 6 hours now with olanzapine.   No SE Plan: increase olanzapine 15 mg HS. BC failure of alternatives, alprazolam 0.5 mg AM before work.   paroxetine to 40 mg at night.  04/28/2021 appointment with the following noted: Increase olanzapine 15 mg HS helped awhile with initial dizziness resolved.  Not drowsy. Still better than it was but holidays are hard.  Better than in a long time overall.  H saw a difference also.   Sleep 5-6 hours.  Never been a 7 hour sleeper.   Eating is normal now. Depression still there but better also 3/10.  Can enjoy some things. Better interest. Most stressful thing about work is the work load and talking with customers who  are difficult.  Had this job 13 years. Plan: Increase olanzapine 20 mg 3 hours before HS.  06/10/2021 phone call complaining of persistent insomnia.  She was allowed to increase alprazolam to 0.5 mg every morning and 1 mg nightly  06/30/2021 appointment with the following noted: Taking alprazolam 0.5 mg 2 daily usually. Increased olanzapine to 20 mg daily. CABG 2006.  Valve problem with CP and hospitalized.  Change in med. Going better now. No problems with meds.   About 6 hour sleep and in bed earlier.  Depression is OK overall but residual anxiety and crying.  Anxiety around health now too and work. Panic is not gone but better on Paroxetine 20 Sometimes brief irritab ility situationally. Plan: No med changes.  Continue olanzapine 20 mg, paroxetine 40 mg, alprazolam 0.5 mg  twice daily as needed  11/18/2021 appointment with the following noted: At one point since here anxiety got a lot worse but getting better now.  Some panic attacks including Saturday.   Compliant.  More work stress trying to get caught up.   Only taking alprazolam 0.5 mg HS and not in daytime.  Not sleepy with it daytime. No SE No depression or anxiety.   Plan: no med changes  04/08/22 TC:  increased anxiety and panic wanting med changes, so increase paroxetine to 60 mg daily  04/14/22 appt noted: Current meds alprazolam 0.5 mg 3 times daily as needed anxiety, buspirone 30 mg twice daily, Depakote ER 1500 mg nightly,, Olanzapine 20 mg nightly, quetiapine 25 mg nightly for sleep, paroxetine 60 mg as of 04/08/22 Anxiety and panic worse for a month and missed some work and  then taken out of work by PCP 03/30/22. Sx including panic with SOB. At least 2-3 times per day.  Worrying about everything she has to do including doctor's appts needs eye surgery.  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 Having to talk with customers all day triggers panic and can't talk during panic attacks. Even outside of work trouble getting things done Dt low energy and anxiety.  Everything closing in at one tgime and trouble breathing.  Feel overwhelmed all the time.   Also depression and has to push herself to go out of the house and low energy. Sleep is about 5-6 hours per night.   Wants to stay out of work until 12/22 to give time for doctor appts and get herself together. Current job 13 years. No med changes  05/17/22 appt noted: Anxiety is awful right now.  Hard to breath.  Heart races at times with panic.  Mind races.   2-3 panic attacks daily. SE maybe some sleepiness. Sleep about 4-5 hours.  Can't calm her mind down.   No recent counselors but has appt on 12/27 pending. Still out of work.  [place] asking about the [place] plan     PDMP only shows Xanax.  She denies abusing substances  Past  Psychiatric Medication Trials: Hydroxyzine, mirtazapine poor response for sleep,  Depakote,  Xanax, buspirone,  quetiapine low-dose for sleep Olanzapine 20 duloxetine, citalopram, Wellbutrin, fluoxetine, paroxetine 40  Notes and chart were reviewed with the patient regarding prior history and symptoms.  Review of Systems:  Review of Systems  Cardiovascular:  Negative for palpitations.  Gastrointestinal:  Negative for nausea.  Neurological:  Negative for dizziness and tremors.  Psychiatric/Behavioral:  Positive for dysphoric mood. Negative for decreased concentration and sleep disturbance. The patient is nervous/anxious.     Medications: I have reviewed the patient's current medications.  Current Outpatient Medications  Medication Sig Dispense Refill   ACCU-CHEK GUIDE test strip USE UP TO FOUR TIMES DAILY AS DIRECTED 400 strip 6   Accu-Chek Softclix Lancets lancets USE UP TO FOUR TIMES DAILY AS DIRECTED 100 each 12   aspirin 81 MG EC tablet Take 1 tablet (81 mg total) by mouth daily. Swallow whole. 30 tablet 12   atorvastatin (LIPITOR) 80 MG tablet TAKE 1 TABLET BY MOUTH DAILY 90 tablet 3   BD PEN NEEDLE NANO U/F 32G X 4 MM MISC USE AS DIRECTED 300 each 1   blood glucose meter kit and supplies KIT Dispense based on patient and insurance preference. Use up to four times daily as directed. (FOR ICD-9 250.00, 250.01). 1 each 0   cilostazol (PLETAL) 100 MG tablet Take 1 tablet (100 mg total) by mouth 2 (two) times daily before a meal. 60 tablet 11   cloNIDine (CATAPRES) 0.1 MG tablet 1/2 tablet twice daily for 2 days then 1 twice daily 60 tablet 0   clopidogrel (PLAVIX) 75 MG tablet TAKE 1 TABLET BY MOUTH EVERY DAY WITH BREAKFAST 90 tablet 3   Continuous Blood Gluc Receiver (DEXCOM G6 RECEIVER) DEVI Use as directed 1 each 1   Continuous Blood Gluc Sensor (DEXCOM G6 SENSOR) MISC Use as directed 1 each 1   Continuous Blood Gluc Transmit (DEXCOM G6 TRANSMITTER) MISC Use as directed 1 each 1    dapagliflozin propanediol (FARXIGA) 10 MG TABS tablet Take 1 tablet (10 mg total) by mouth daily before breakfast. 30 tablet 11   divalproex (DEPAKOTE ER) 500 MG 24 hr tablet Take 3 tablets (1,500 mg total) by  mouth at bedtime. 270 tablet 1   Glycerin-Hypromellose-PEG 400 (VISINE DRY EYE OP) Place 1 drop into both eyes 3 (three) times daily as needed (dry eyes).     Insulin Glargine (BASAGLAR KWIKPEN) 100 UNIT/ML INJECT 12 UNITS INTO THE SKIN DAILY 15 mL 3   isosorbide mononitrate (IMDUR) 120 MG 24 hr tablet Take 1 tablet (120 mg total) by mouth daily. 90 tablet 3   JANUVIA 50 MG tablet TAKE 1 TABLET BY MOUTH EVERY DAY 90 tablet 1   lisinopril (ZESTRIL) 10 MG tablet TAKE 1 TABLET BY MOUTH EVERY DAY 90 tablet 3   metoprolol succinate (TOPROL-XL) 25 MG 24 hr tablet TAKE 1 TABLET BY MOUTH EVERY DAY *KEEP APPOINTMENT FOR REFILLS* 90 tablet 3   mupirocin ointment (BACTROBAN) 2 % Apply 1 application. topically 2 (two) times daily. 30 g 2   nitroGLYCERIN (NITROSTAT) 0.4 MG SL tablet PLACE 1 TABLET UNDER THE TONGUE EVERY 5 MINUTES AS NEEDED FOR CHOEST PAIN 25 tablet 1   OLANZapine (ZYPREXA) 20 MG tablet Take 1 tablet (20 mg total) by mouth at bedtime. 30 tablet 1   PARoxetine (PAXIL) 20 MG tablet Take 3 tablets (60 mg total) by mouth daily. As of 04/08/22 270 tablet 0   QUEtiapine (SEROQUEL) 25 MG tablet 1 daily     ranolazine (RANEXA) 500 MG 12 hr tablet Take 1 tablet (500 mg total) by mouth 2 (two) times daily. 180 tablet 3   Vitamin D, Ergocalciferol, (DRISDOL) 1.25 MG (50000 UNIT) CAPS capsule Take 1 capsule (50,000 Units total) by mouth every 7 (seven) days. 12 capsule 0   XIIDRA 5 % SOLN Place 1 drop into both eyes in the morning and at bedtime.     ALPRAZolam (XANAX) 0.5 MG tablet TAKE 1-2 TABLETS BY MOUTH TWICE A DAY AS NEEDED FOR ANXIETY 100 tablet 0   No current facility-administered medications for this visit.    Medication Side Effects: ? 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   Qualifier: Diagnosis of  By: Garen Grams     Diabetes mellitus type 2 in nonobese ([place]) 07/2016   Dyslipidemia    Headache(784.0)    History of pulmonary embolism 07/08/2020   HTN (hypertension)    Migraine    NSTEMI (non-ST elevated myocardial infarction) ([place])    PAD (peripheral artery disease) ([place])    Pulmonary embolus ([facility])    unprovoked    Family History  Problem Relation Age of Onset   Lupus Mother    Heart attack Father    Diabetes Father    Hypertension Father    Diabetes Paternal Grandmother    Hypertension Paternal Grandmother    Stroke Neg Hx    Kidney disease Neg Hx    Hyperlipidemia Neg Hx    Sudden death Neg Hx     Social History   Socioeconomic History   Marital status: Legally Separated    Spouse name: Not on file   Number of children: Not on file   Years of education: Not on file   Highest education level: Not on file  Occupational History   Occupation: mortgage loan specialist  Tobacco Use   Smoking status: Some Days  Years: 25.00    Types: Cigarettes   Smokeless tobacco: Never   Tobacco comments:    0.5 pack cigarettes  per month per patient   Vaping Use   Vaping Use: Never used  Substance and Sexual Activity   Alcohol use: No    Alcohol/week: 0.0 standard drinks of alcohol   Drug use: Never   Sexual activity: Yes    Partners: Male    Comment: married  Other Topics Concern   Not on file  Social History Narrative   Regular exercise:  3 days weekly   Caffeine Use:  1 soda daily   One child biological daughter born in 59 and an adopted niece.   [place] specialist   Married- may be divorcing.          Social Determinants of  Health   Financial Resource Strain: Not on file  Food Insecurity: Not on file  Transportation Needs: Not on file  Physical Activity: Not on file  Stress: Not on file  Social Connections: Not on file  Intimate Partner Violence: Not on file    Past Medical History, Surgical history, Social history, and Family history were reviewed and updated as appropriate.   Please see review of systems for further details on the patient's review from today.   Objective:   Physical Exam:  LMP 11/28/2009   Physical Exam Constitutional:      General: She is not in acute distress.    Appearance: She is well-developed.  Musculoskeletal:        General: No deformity.  Neurological:     Mental Status: She is alert and oriented to person, place, and time.     Coordination: Coordination normal.  Psychiatric:        Attention and Perception: She is attentive. She does not perceive auditory hallucinations.        Mood and Affect: Mood is anxious and depressed. Affect is not labile, blunt, angry or tearful.        Speech: Speech normal. Speech is not rapid and pressured.        Behavior: Behavior normal. Behavior is not agitated.        Thought Content: Thought content normal. Thought content is not paranoid or delusional. Thought content does not include homicidal or suicidal ideation.        Cognition and Memory: Cognition normal.        Judgment: Judgment normal.     Comments: Insight intact. No auditory or visual hallucinations. No delusions.  Work stress     Lab Review:     Component Value Date/Time   NA 138 02/25/2022 0950   NA 141 07/23/2020 1300   K 4.4 02/25/2022 0950   CL 106 02/25/2022 0950   CO2 26 02/25/2022 0950   GLUCOSE 89 02/25/2022 0950   BUN 18 02/25/2022 0950   BUN 7 07/23/2020 1300   CREATININE 1.17 02/25/2022 0950   CREATININE 0.72 06/22/2012 0840   CALCIUM 9.6 02/25/2022 0950   PROT 6.6 03/29/2022 1504   ALBUMIN 4.6 03/29/2022 1504   AST 14 03/29/2022 1504   ALT  9 03/29/2022 1504   ALKPHOS 152 (H) 03/29/2022 1504   BILITOT 0.4 03/29/2022 1504   GFRNONAA >60 05/06/2021 0351   GFRNONAA >89 06/22/2012 0840   GFRAA 102 07/23/2020 1300   GFRAA >89 06/22/2012 0840       Component Value Date/Time   WBC 6.1 02/25/2022 0950   RBC 3.99 02/25/2022 0950  HGB 12.3 02/25/2022 0950   HCT 36.5 02/25/2022 0950   PLT 187.0 02/25/2022 0950   MCV 91.4 02/25/2022 0950   MCH 29.6 05/07/2021 0149   MCHC 33.8 02/25/2022 0950   RDW 14.4 02/25/2022 0950   LYMPHSABS 2.2 02/25/2022 0950   MONOABS 0.4 02/25/2022 0950   EOSABS 0.3 02/25/2022 0950   BASOSABS 0.0 02/25/2022 0950    No results found for: "POCLITH", "LITHIUM"   No results found for: "PHENYTOIN", "PHENOBARB", "VALPROATE", "CBMZ"   .res Assessment: Plan:    Janashia was seen today for follow-up and anxiety.  Diagnoses and all orders for this visit:  Panic disorder with agoraphobia -     cloNIDine (CATAPRES) 0.1 MG tablet; 1/2 tablet twice daily for 2 days then 1 twice daily -     ALPRAZolam (XANAX) 0.5 MG tablet; TAKE 1-2 TABLETS BY MOUTH TWICE A DAY AS NEEDED FOR ANXIETY  Moderate mixed bipolar I disorder ([facility])  Generalized anxiety disorder -     cloNIDine (CATAPRES) 0.1 MG tablet; 1/2 tablet twice daily for 2 days then 1 twice daily  Insomnia due to mental condition  Chronic work stress  Greater than 50% of 30 min face to face time with patient was spent on counseling and coordination of care. We discussed the following: she and her husband have both noted significant improvement and her level of anxiety and she is less depressed with the addition of olanzapine and the increased to 20 mg daily.  It is also a mood stabilizer.  It also can help with depression when added to an SSRI.  We discussed side effects in detail.  This is the usual max dose. Consider further increase olanzapine if necessary Continue olanzapine 20 mg 3 hours before HS. Continue Depakote ER 1500 mg HS  DC  buspirone Off label clonidine and increase to 0.1 mg BID for severe anxiety and panic  Increased paroxetine to 60 mg daily as of 04/08/22 for worsening panic. No better so far with increased dose.  BC failure of alternatives, alprazolam 0.5 mg AM before work twice daily.  Alternatives clonazepam, Ativan  Discussed potential metabolic side effects associated with atypical antipsychotics, as well as potential risk for movement side effects. Advised pt to contact office if movement side effects occur.  Metabolic side effects are unlikely at this low dose of quetiapine and almost no risk of EPS at this low-dose. DM managed  We discussed the short-term risks associated with benzodiazepines including sedation and increased fall risk among others.  Discussed long-term side effect risk including dependence, potential withdrawal symptoms, and the potential eventual dose-related risk of dementia.  But recent studies from 2020 dispute this association between benzodiazepines and dementia risk. Newer studies in 2020 do not support an association with dementia. If too sleepy with it call and will call if needed to switch to lorazepam.  Needs counseling refer to [place] from work for severe anxiety and panic until 06/29/21  This appt was 30 mins.   Follow-up Dec  Lynder Parents MD, DFAPA  Please see After Visit Summary for patient specific instructions.   Future Appointments  Date Time Provider [place]  05/26/2022 10:00 AM Donnetta Hutching, LMFT [facility] None  06/29/2022  4:00 PM Cottle, Billey Co., MD [facility] None  07/06/2022  1:00 PM [name], NP [facility]  07/20/2022  1:00 PM [facility] 3 - EM [facility]  07/20/2022  1:20 PM Cherre Robins, MD [facility]  07/26/2022  2:15 PM [facility]    No orders of the defined types were placed in this encounter.      -------------------------------

## 2022-05-18 NOTE — Telephone Encounter (Signed)
Pt lvm asking if her office notes from yesterday had been sent to [facility]. Please give her a call back at [phone]

## 2022-05-19 ENCOUNTER — Telehealth: Payer: Self-pay | Admitting: Psychiatry

## 2022-05-19 NOTE — Telephone Encounter (Signed)
Patient returned call to office regarding notes needed to be sent over to [facility]. Pls f rtc to patient. Ph: [phone]

## 2022-05-21 NOTE — Telephone Encounter (Signed)
See previous messages. Office notes from 03/30/22 to present have been faxed to [facility]

## 2022-05-21 NOTE — Telephone Encounter (Signed)
The records from 03/30/2022-05/17/22 have been faxed to [facility]

## 2022-05-22 ENCOUNTER — Other Ambulatory Visit: Payer: Self-pay | Admitting: Psychiatry

## 2022-05-22 DIAGNOSIS — F3162 Bipolar disorder, current episode mixed, moderate: Secondary | ICD-10-CM

## 2022-05-22 DIAGNOSIS — F4001 Agoraphobia with panic disorder: Secondary | ICD-10-CM

## 2022-05-22 DIAGNOSIS — F411 Generalized anxiety disorder: Secondary | ICD-10-CM

## 2022-05-24 ENCOUNTER — Other Ambulatory Visit: Payer: Self-pay | Admitting: Cardiovascular Disease

## 2022-05-25 ENCOUNTER — Encounter: Payer: Self-pay | Admitting: Family

## 2022-05-26 ENCOUNTER — Ambulatory Visit (INDEPENDENT_AMBULATORY_CARE_PROVIDER_SITE_OTHER): Payer: 59 | Admitting: Behavioral Health

## 2022-05-26 DIAGNOSIS — F411 Generalized anxiety disorder: Secondary | ICD-10-CM | POA: Diagnosis not present

## 2022-05-26 DIAGNOSIS — F3162 Bipolar disorder, current episode mixed, moderate: Secondary | ICD-10-CM

## 2022-05-26 NOTE — Progress Notes (Signed)
[place] Counselor Initial Adult Exam  Name: Bailey Greene Date: 05/26/2022 MRN: [id number] DOB: 06/07/1960 PCP: [name], NP  Time spent: 60 min In Person @ [facility] [place]:  Self    Paperwork requested: No   Reason for Visit /Presenting Problem: Elevated anx/dep & work stress. Pt exp'g new health issues in the past year & her beloved older Str died whom Pt was caregiver for in the last few yrs.  Mental Status Exam: Appearance:   Casual     Behavior:  Appropriate and Sharing  Motor:  Normal  Speech/Language:   Clear and Coherent  Affect:  Appropriate  Mood:  Anxious  Thought process:  normal  Thought content:    WNL  Sensory/Perceptual disturbances:    WNL  Orientation:  oriented to person, place, and time/date  Attention:  Good  Concentration:  Good  Memory:  WNL  Fund of knowledge:   Good  Insight:    Good  Judgment:   Good  Impulse Control:  Good    Risk Assessment: Danger to Self:  No Self-injurious Behavior: No Danger to Others: No Duty to Warn:no Physical Aggression / Violence:No  Access to Firearms a concern: No  Gang Involvement:No  Patient / guardian was educated about steps to take if suicide or homicide risk level increases between visits: yes While future psychiatric events cannot be accurately predicted, the patient does not currently require acute inpatient psychiatric care and does not currently meet [facility] involuntary commitment criteria.  Substance Abuse History: Current substance abuse: No     Past Psychiatric History:   No previous psychological problems have been observed Outpatient Providers: [name], NP & Cristy Friedlander, MD History of Psych Hospitalization: No  Psychological Testing:  NA    Abuse History:  Victim of: No.,  NA    Report needed: No. Victim of Neglect:No. Perpetrator of  NA   Witness / Exposure to Domestic Violence: No   Protective Services Involvement:  No  Witness to [employer] Violence:  No   Family History:  Family History  Problem Relation Age of Onset   Lupus Mother    Heart attack Father    Diabetes Father    Hypertension Father    Diabetes Paternal Grandmother    Hypertension Paternal Grandmother    Stroke Neg Hx    Kidney disease Neg Hx    Hyperlipidemia Neg Hx    Sudden death Neg Hx     Living situation: the patient lives with their family  Sexual Orientation: Straight  Relationship Status: married & legal separation in the recent past; Cpl have been living tgthr for the past year to work on the relationship. 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 [employer]: No   Educational History: Education: some college  Religion/Sprituality/World View: Baptist  Any cultural differences that may affect / interfere with treatment:  None noted today  Recreation/Hobbies: music; Gospel calms her & Pt likes to walk  Stressors: Health problems   Loss of health since her MI & DMT2 diagnoses about a year ago   Other:  Death of her older Str whom she cared for    Strengths: Supportive Relationships, Family, Friends, [place], Spirituality, Conservator, museum/gallery, and Able to Communicate Effectively  Barriers:  None noted today   Legal History: Pending legal issue / charges: The patient has no significant history of legal issues. History of legal issue / charges:  None noted today  Medical History/Surgical History: reviewed Past Medical History:  Diagnosis Date   Anxiety    Bipolar affective disorder ([place])    CAD (coronary artery disease) 2006   CABG w/ LIMA-LAD,  RIMA-Diag, SVG-OM1-OM2, R radial-PDA   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   Qualifier: Diagnosis of  By: Garen Grams     Diabetes mellitus type 2 in nonobese ([place]) 07/2016   Dyslipidemia    Headache(784.0)    History of pulmonary embolism 07/08/2020   HTN (hypertension)    Migraine    NSTEMI (non-ST elevated myocardial infarction) ([place])    PAD (peripheral artery disease) ([place])    Pulmonary embolus ([place])    unprovoked    Past Surgical History:  Procedure Laterality Date   BUNIONECTOMY  08/2011   right foot   CARDIAC CATHETERIZATION  2007   severe native 3 v dz, all grafts patent (LIMA-LAD, RIMA-Diag, SVG-OM1-OM2, R radial-PDA)   CESAREAN SECTION  1984   CORONARY ARTERY BYPASS GRAFT  2006    Coronary artery bypass grafting x5 with a left  internal  mammary to the left anterior descending coronary artery.  Free right  internal mammary to the diagonal coronary artery, sequential reverse  saphenous vein graft to the first and second obtuse marginal, right  artery bypass to the posterior descending coronary artery with endo-vein harvesting.   LEFT HEART CATH AND CORS/GRAFTS ANGIOGRAPHY N/A 05/05/2021   Procedure: LEFT HEART CATH AND CORS/GRAFTS ANGIOGRAPHY;  Surgeon: Leonie Man, MD;  Location: [facility];  Service: Cardiovascular;  Laterality: N/A;   UMBILICAL HERNIA REPAIR N/A 01/16/2021   Procedure: UMBILICAL  HERNIA REPAIR;  Surgeon: Dwan Bolt, MD;  Location: [facility];  Service: General;  Laterality: N/A;    Medications: Current Outpatient Medications  Medication Sig Dispense Refill   ACCU-CHEK GUIDE test strip USE UP TO FOUR TIMES DAILY AS DIRECTED 400 strip 6   Accu-Chek Softclix Lancets lancets USE UP TO FOUR TIMES DAILY AS DIRECTED 100 each 12   ALPRAZolam (XANAX) 0.5 MG tablet TAKE 1-2 TABLETS BY MOUTH TWICE A DAY AS NEEDED FOR ANXIETY 100 tablet 0   aspirin 81 MG EC tablet Take 1 tablet (81 mg total) by mouth daily. Swallow whole. 30 tablet 12   atorvastatin  (LIPITOR) 80 MG tablet TAKE 1 TABLET BY MOUTH DAILY 90 tablet 3   BD PEN NEEDLE NANO U/F 32G X 4 MM MISC USE AS DIRECTED 300 each 1   blood glucose meter kit and supplies KIT Dispense based on patient and insurance preference. Use up to four times daily as directed. (FOR ICD-9 250.00, 250.01). 1 each 0   cilostazol (PLETAL) 100 MG tablet Take 1 tablet (100 mg total) by mouth 2 (two) times daily before a meal. 60 tablet 11   cloNIDine (CATAPRES) 0.1 MG tablet 1/2 tablet twice daily for 2 days then 1 twice daily 60 tablet 0   clopidogrel (PLAVIX) 75 MG tablet TAKE 1 TABLET BY MOUTH EVERY DAY WITH BREAKFAST 90 tablet 3   Continuous Blood Gluc Receiver (DEXCOM G6 RECEIVER) DEVI Use as directed 1 each 1   Continuous Blood Gluc Sensor (DEXCOM G6 SENSOR)  MISC Use as directed 1 each 1   Continuous Blood Gluc Transmit (DEXCOM G6 TRANSMITTER) MISC Use as directed 1 each 1   divalproex (DEPAKOTE ER) 500 MG 24 hr tablet Take 3 tablets (1,500 mg total) by mouth at bedtime. 270 tablet 1   FARXIGA 10 MG TABS tablet TAKE 1 TABLET BY MOUTH DAILY BEFORE BREAKFAST 30 tablet 11   Glycerin-Hypromellose-PEG 400 (VISINE DRY EYE OP) Place 1 drop into both eyes 3 (three) times daily as needed (dry eyes).     Insulin Glargine (BASAGLAR KWIKPEN) 100 UNIT/ML INJECT 12 UNITS INTO THE SKIN DAILY 15 mL 3   isosorbide mononitrate (IMDUR) 120 MG 24 hr tablet Take 1 tablet (120 mg total) by mouth daily. 90 tablet 3   JANUVIA 50 MG tablet TAKE 1 TABLET BY MOUTH EVERY DAY 90 tablet 1   lisinopril (ZESTRIL) 10 MG tablet TAKE 1 TABLET BY MOUTH EVERY DAY 90 tablet 3   metoprolol succinate (TOPROL-XL) 25 MG 24 hr tablet TAKE 1 TABLET BY MOUTH EVERY DAY *KEEP APPOINTMENT FOR REFILLS* 90 tablet 3   mupirocin ointment (BACTROBAN) 2 % Apply 1 application. topically 2 (two) times daily. 30 g 2   nitroGLYCERIN (NITROSTAT) 0.4 MG SL tablet PLACE 1 TABLET UNDER THE TONGUE EVERY 5 MINUTES AS NEEDED FOR CHOEST PAIN 25 tablet 1   OLANZapine  (ZYPREXA) 20 MG tablet Take 1 tablet (20 mg total) by mouth at bedtime. 30 tablet 1   PARoxetine (PAXIL) 20 MG tablet Take 3 tablets (60 mg total) by mouth daily. As of 04/08/22 270 tablet 0   QUEtiapine (SEROQUEL) 25 MG tablet 1 daily     ranolazine (RANEXA) 500 MG 12 hr tablet Take 1 tablet (500 mg total) by mouth 2 (two) times daily. 180 tablet 3   Vitamin D, Ergocalciferol, (DRISDOL) 1.25 MG (50000 UNIT) CAPS capsule Take 1 capsule (50,000 Units total) by mouth every 7 (seven) days. 12 capsule 0   XIIDRA 5 % SOLN Place 1 drop into both eyes in the morning and at bedtime.     No current facility-administered medications for this visit.    Allergies  Allergen Reactions   Metformin And Related Nausea And Vomiting    Diagnoses:  Moderate mixed bipolar I disorder (HCC)  Generalized anxiety disorder  Plan of Care: Pt will attend psychotherapy every 2-3 wks as scheduled.  Target Date: 08/25/2022  Progress: 3  Frequency: Twice monthly  Modality: Norman Herrlich is trying to understand her anx/dep. She wants her Husb to understand what she exp's. Pt will use the resources provided today & report back re: effectiveness next session. Resources are [employer] on YouTube; have Husb watch w/you, ice cold water/ice cubes, & [email or web address]cy.  Target Date: Week of Jan 8th  Progress: 0  Frequency: Twice monthly  Modality: Boykin Reaper, LMFT

## 2022-05-26 NOTE — Progress Notes (Signed)
                Bailey Greene L Bailey Belmar, LMFT 

## 2022-05-27 NOTE — Telephone Encounter (Signed)
Bailey Greene called today to report that she got a call from [facility] on 12/26 and they did indicated that they had received the new records/ppwk and that they woud be reaching out to the dr. With questions.

## 2022-05-28 NOTE — Telephone Encounter (Signed)
Dr. Jennelle Human is aware and has the fax with the information. An apt will probably need to be set up.

## 2022-06-03 ENCOUNTER — Telehealth: Payer: Self-pay | Admitting: Psychiatry

## 2022-06-03 NOTE — Telephone Encounter (Signed)
LVM to rtc 

## 2022-06-03 NOTE — Telephone Encounter (Signed)
Bailey Greene called this morning at 10:08 wanting to discuss a possible increase of her Clonidine.  She is experience a lot of anxiety and is thinking she may to increase the dose of this medication.  Please call to discuss.  Appt 06/29/22

## 2022-06-04 NOTE — Telephone Encounter (Signed)
LVM again

## 2022-06-06 ENCOUNTER — Encounter: Payer: Self-pay | Admitting: Family

## 2022-06-07 NOTE — Telephone Encounter (Signed)
Forms completed, patient advised forms upfront for pick up. Forms are missing patient's information and signature

## 2022-06-07 NOTE — Telephone Encounter (Signed)
Spoke to pt Friday and she stated she has been having increased anxiety and panic attacks.She wants to know if clonidine can be increased.She mentioned she has been checking BP and it has been good.

## 2022-06-07 NOTE — Telephone Encounter (Signed)
Ok to increase clonidine to 2 of the 0.1 mg tablets twice daily for TR anxiety

## 2022-06-08 ENCOUNTER — Other Ambulatory Visit: Payer: Self-pay

## 2022-06-08 DIAGNOSIS — F411 Generalized anxiety disorder: Secondary | ICD-10-CM

## 2022-06-08 DIAGNOSIS — F4001 Agoraphobia with panic disorder: Secondary | ICD-10-CM

## 2022-06-08 MED ORDER — CLONIDINE HCL 0.1 MG PO TABS: 0.2000 mg | ORAL_TABLET | Freq: Two times a day (BID) | ORAL | 0 refills | Status: DC

## 2022-06-08 NOTE — Telephone Encounter (Signed)
Pt informed

## 2022-06-11 ENCOUNTER — Ambulatory Visit (INDEPENDENT_AMBULATORY_CARE_PROVIDER_SITE_OTHER): Payer: 59 | Admitting: Behavioral Health

## 2022-06-11 ENCOUNTER — Other Ambulatory Visit: Payer: Self-pay | Admitting: Family

## 2022-06-11 DIAGNOSIS — F411 Generalized anxiety disorder: Secondary | ICD-10-CM

## 2022-06-11 DIAGNOSIS — F3162 Bipolar disorder, current episode mixed, moderate: Secondary | ICD-10-CM

## 2022-06-11 NOTE — Progress Notes (Signed)
[place] Counselor/Therapist Progress Note  Patient ID: Camary Sosa, MRN: [id number],    Date: 06/11/2022  Time Spent: 19 min Caregility video; Pt is home in private & Provider @ [facility] remote   Treatment Type: Individual Therapy  Reported Symptoms: Elevated anx/dep & work stress  Mental Status Exam: Appearance:  Casual     Behavior: Appropriate and Sharing  Motor: Normal  Speech/Language:  Clear and Coherent and Normal Rate  Affect: Appropriate  Mood: normal  Thought process: normal  Thought content:   WNL  Sensory/Perceptual disturbances:   WNL  Orientation: oriented to person, place, and time/date  Attention: Good  Concentration: Good  Memory: WNL  Fund of knowledge:  Good  Insight:   Good  Judgment:  Good  Impulse Control: Good   Risk Assessment: Danger to Self:  No Self-injurious Behavior: No Danger to Others: No Duty to Warn:no Physical Aggression / Violence:No  Access to Firearms a concern: No  Gang Involvement:No   Subjective: Pt expressing her anx/dep & inc'd stress due to her re-entry into work.    Interventions:  SFBT  Diagnosis:Moderate mixed bipolar I disorder (HCC)  Generalized anxiety disorder  Plan: Kennyth Lose will implement the tips/suggestions from this session & report back @ next visit.  Target Date: 07/12/2022  Progress: 3  Frequency: Twice monthly  Modality: Boykin Reaper, LMFT

## 2022-06-11 NOTE — Progress Notes (Signed)
                Bailey Greene L Natania Finigan, LMFT 

## 2022-06-13 NOTE — Telephone Encounter (Signed)
Please contact pt to schedule follow up vit D check before we decide on vit D dose moving forward.

## 2022-06-14 ENCOUNTER — Telehealth: Payer: Self-pay | Admitting: Cardiology

## 2022-06-14 NOTE — Telephone Encounter (Signed)
Contacted patient, she states for the last week she has been having some chest discomfort that comes and goes. She states it is as random times, she can notice some SOB, not currently having pains at this time or SOB during call, denies swelling/weight gain. She states her BP has been good running 110/70's, does not check her HR. She states she is under a lot of stress and has a lot of anxiety and this could be the cause, but she is not sure. Patient is continuing her Plavix at this time, she was recently placed on Clonidine, no other medication changes. She states she has not currently used any nitroglycerin, but is going to pick it up from the pharmacy in case of the need.   Patient had recent visit in December with Dr.Hochrein, however patient would like an appointment to discuss further and be evaluated.   I scheduled for NP on 01/22- did advise I would notify MD and would call back with any further recommendations.

## 2022-06-14 NOTE — Telephone Encounter (Signed)
  Per MyChart scheduling message:  Pt c/o of Chest Pain: STAT if CP now or developed within 24 hours  1. Are you having CP right now?   2. Are you experiencing any other symptoms (ex. SOB, nausea, vomiting, sweating)?   3. How long have you been experiencing CP?   4. Is your CP continuous or coming and going?   5. Have you taken Nitroglycerin?    Yes I am experiencing chest pain on and off along with shortness of breath tightness in chest some sweating some dizziness. This has been going on for about a week.   Yes I have I also have a prescription to pick up today for the nitroglycerin  ?

## 2022-06-16 NOTE — Telephone Encounter (Signed)
Called patient, advised of message below from MD.  Patient verbalized understanding.  

## 2022-06-21 ENCOUNTER — Ambulatory Visit: Payer: 59 | Admitting: General Practice

## 2022-06-21 NOTE — Telephone Encounter (Signed)
Called but no answer, lvm for patient to call and schedule lab vit d check appt

## 2022-06-23 ENCOUNTER — Other Ambulatory Visit: Payer: Self-pay | Admitting: Family

## 2022-06-23 ENCOUNTER — Other Ambulatory Visit: Payer: Self-pay | Admitting: Psychiatry

## 2022-06-23 DIAGNOSIS — F411 Generalized anxiety disorder: Secondary | ICD-10-CM

## 2022-06-23 DIAGNOSIS — F4001 Agoraphobia with panic disorder: Secondary | ICD-10-CM

## 2022-06-24 ENCOUNTER — Other Ambulatory Visit: Payer: Self-pay | Admitting: Psychiatry

## 2022-06-24 DIAGNOSIS — F411 Generalized anxiety disorder: Secondary | ICD-10-CM

## 2022-06-24 DIAGNOSIS — F4001 Agoraphobia with panic disorder: Secondary | ICD-10-CM

## 2022-06-25 NOTE — Telephone Encounter (Signed)
Patient will get labs at next scheduled appointment on 07/06/2022

## 2022-06-27 ENCOUNTER — Other Ambulatory Visit: Payer: Self-pay

## 2022-06-27 DIAGNOSIS — F4001 Agoraphobia with panic disorder: Secondary | ICD-10-CM

## 2022-06-27 DIAGNOSIS — F411 Generalized anxiety disorder: Secondary | ICD-10-CM

## 2022-06-27 MED ORDER — CLONIDINE HCL 0.1 MG PO TABS: 0.1000 mg | ORAL_TABLET | Freq: Every day | ORAL | 0 refills | Status: DC

## 2022-06-29 ENCOUNTER — Encounter: Payer: Self-pay | Admitting: Psychiatry

## 2022-06-29 ENCOUNTER — Ambulatory Visit (INDEPENDENT_AMBULATORY_CARE_PROVIDER_SITE_OTHER): Payer: 59 | Admitting: Psychiatry

## 2022-06-29 VITALS — BP 105/68 | HR 75

## 2022-06-29 DIAGNOSIS — F411 Generalized anxiety disorder: Secondary | ICD-10-CM | POA: Diagnosis not present

## 2022-06-29 DIAGNOSIS — F5105 Insomnia due to other mental disorder: Secondary | ICD-10-CM

## 2022-06-29 DIAGNOSIS — F3162 Bipolar disorder, current episode mixed, moderate: Secondary | ICD-10-CM | POA: Diagnosis not present

## 2022-06-29 DIAGNOSIS — F4001 Agoraphobia with panic disorder: Secondary | ICD-10-CM

## 2022-06-29 MED ORDER — ALPRAZOLAM 0.5 MG PO TABS: 0.5000 mg | ORAL_TABLET | Freq: Three times a day (TID) | ORAL | 1 refills | Status: DC | PRN

## 2022-06-29 MED ORDER — PAROXETINE HCL 20 MG PO TABS: 60.0000 mg | ORAL_TABLET | Freq: Every day | ORAL | 0 refills | Status: DC

## 2022-06-29 MED ORDER — CLONIDINE HCL 0.1 MG PO TABS: ORAL_TABLET | ORAL | 0 refills | Status: DC

## 2022-06-29 MED ORDER — OLANZAPINE 20 MG PO TABS: 20.0000 mg | ORAL_TABLET | Freq: Every day | ORAL | 0 refills | Status: DC

## 2022-06-29 NOTE — Progress Notes (Signed)
Bailey Greene [id number] September 23, 1960 62 y.o.   Subjective:   Patient ID:  Bailey Greene is a 62 y.o. (DOB [birth year]) female.  Chief Complaint:  Chief Complaint  Patient presents with   Follow-up    Moderate mixed bipolar I disorder ([place])   Anxiety    Anxiety Symptoms include nervous/anxious behavior. Patient reports no decreased concentration, dizziness, nausea or palpitations.     Bailey Greene presents for follow-up of bipolar disorder and panic attacks and general anxiety.  visit 5/22,2020.  Increased Depakote then to 1500 mg daily.  Boss rec LOA for 4 weeks.  Going through separation is really tough.  Panic attacks at work interfering.  Had this job 10 years.  Likes the job but hard to concentrate and stay focused.  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  Panic incr pulse and SOB, fear, sweating and shakey.  Panic lasts 20 mins and increased frequency.  Several in a day.  Going on for 2 mos but getting worse.  Boss can tell from listening to her calls and drop in production.  At follow up visit November 22, 2018.  Mood swings are better.  Trouble staying asleep.  Caffeine 1 coffee and 1 soda daily.She was still having severe anxiety plus panic.  We discussed the risk of SSRIs trickling triggering mood swings but the severity of the anxiety was such that we decided to initiate fluoxetine 10 mg daily to increase to 20 mg daily.  We also were using low-dose mirtazapine to help with sleep.  seen January 18, 2019.  She was granted medical leave for panic symptoms.  She was switched to paroxetine for panic from fluoxetine.  Since she was switched from mirtazapine to quetiapine 25 to 50 mg nightly for insomnia.   November 2020 visit with the following noted: It is helping some with anxiety but a lot of stress.  No unusual mood swings without a change.   She's satisfied with the 20 mg paroxetine. Sleep is much better with quetiapine and xanax at night.  5 hour and sometimes  better depending on work schedule.   No meds were changed.    10/23/2019 visit with the following noted: Anxiety, dep, panic attacks all worse lately for 2 mos.  Anger also worse mostly just at home bc manages it at work.  Several triggers.  Sister passed March 26 after illness.  Work and daughter are stressful.  Overwhelming. Not as good staying asleep.  Random panic.  Feels SOB.  Wanting to isolate. Spontaneous crying spell.s Poor concentration affecting work.    Plan: Increase paroxetine to 1-1/2 of the 20 mg tablets daily For sleep increase quetiapine to 2 the 25 mg tablets nightly  03/05/20 appt with following noted: Less anxious and sleeping is better.  Panic can be triggered at work with SOB and heart racing.  Happens about 2 times weekly. Main stress is work is overwhelming.  Talk with customers all day.   No clear mania.   Still some depression too. No SE Plan: no med changes except increase paroxetine to 40 for anxiety  09/02/2020 appt noted: Dx DM since here. Anxiety and panic worse since January.  Consistent with meds.  Stress dx DM.  Sister passed away.  Panic with SOB and occ sweats.  Panic daily. Usually before noon usually with trigger. Xanax makes her relax. 1 cup coffee AM.   Sleep 4-5 hours with quetiapine 25 mg.   And pretty consistent. Mood feels labile.  About to lose it  including irritable and angry.   Plan: Increase Quetiapine 25 mg 2 mg HS.   12/10/20 appt noted: Dizziniess for awhile after paroxetine resolved. Still having anxiety and panic but not daily.   Average 7/10 anxiety usually triggered with work as primary stress.  She and H still dealing with problems.  Panic worse either in the AM or evening. Sleep is better 4-5 hours nightly. Taking quetiapine 50 mg with Xanax at night. Still on Depakote ER 1500, busipirone 30 BID , paroxetine 40 mg daily No anger problems at work.  Appetite is better.  Regaining some weight. Evening walks helps mental health. Plan:  Start olanzapine 5 mg daily and if that is not sufficient within a week increase to 10 mg nightly.  We can go higher if needed and call if necessary. BC failure of alternatives, alprazolam 0.5 mg AM before work.   02/18/2021 appointment with the following noted: Anx down to 4-5/10.  Notices calmer with Xanax and throughout the day.Less anxiety and panic at work but getting better. Depression about the same 6/10.  Tries to distract herself from problems at home.. Sleep 6 hours now with olanzapine.   No SE Plan: increase olanzapine 15 mg HS. BC failure of alternatives, alprazolam 0.5 mg AM before work.   paroxetine to 40 mg at night.  04/28/2021 appointment with the following noted: Increase olanzapine 15 mg HS helped awhile with initial dizziness resolved.  Not drowsy. Still better than it was but holidays are hard.  Better than in a long time overall.  H saw a difference also.   Sleep 5-6 hours.  Never been a 7 hour sleeper.   Eating is normal now. Depression still there but better also 3/10.  Can enjoy some things. Better interest. Most stressful thing about work is the work load and talking with customers who  are difficult.  Had this job 13 years. Plan: Increase olanzapine 20 mg 3 hours before HS.  06/10/2021 phone call complaining of persistent insomnia.  She was allowed to increase alprazolam to 0.5 mg every morning and 1 mg nightly  06/30/2021 appointment with the following noted: Taking alprazolam 0.5 mg 2 daily usually. Increased olanzapine to 20 mg daily. CABG 2006.  Valve problem with CP and hospitalized.  Change in med. Going better now. No problems with meds.   About 6 hour sleep and in bed earlier.  Depression is OK overall but residual anxiety and crying.  Anxiety around health now too and work. Panic is not gone but better on Paroxetine 20 Sometimes brief irritab ility situationally. Plan: No med changes.  Continue olanzapine 20 mg, paroxetine 40 mg, alprazolam 0.5 mg  twice daily as needed  11/18/2021 appointment with the following noted: At one point since here anxiety got a lot worse but getting better now.  Some panic attacks including Saturday.   Compliant.  More work stress trying to get caught up.   Only taking alprazolam 0.5 mg HS and not in daytime.  Not sleepy with it daytime. No SE No depression or anxiety.   Plan: no med changes  04/08/22 TC:  increased anxiety and panic wanting med changes, so increase paroxetine to 60 mg daily  04/14/22 appt noted: Current meds alprazolam 0.5 mg 3 times daily as needed anxiety, buspirone 30 mg twice daily, Depakote ER 1500 mg nightly,, Olanzapine 20 mg nightly, quetiapine 25 mg nightly for sleep, paroxetine 60 mg as of 04/08/22 Anxiety and panic worse for a month and missed some work and  then taken out of work by PCP 03/30/22. Sx including panic with SOB. At least 2-3 times per day.  Worrying about everything she has to do including doctor's appts needs eye surgery.  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 Having to talk with customers all day triggers panic and can't talk during panic attacks. Even outside of work trouble getting things done Dt low energy and anxiety.  Everything closing in at one tgime and trouble breathing.  Feel overwhelmed all the time.   Also depression and has to push herself to go out of the house and low energy. Sleep is about 5-6 hours per night.   Wants to stay out of work until 12/22 to give time for doctor appts and get herself together. Current job 13 years. No med changes  05/17/22 appt noted: Anxiety is awful right now.  Hard to breath.  Heart races at times with panic.  Mind races.   2-3 panic attacks daily. SE maybe some sleepiness. Sleep about 4-5 hours.  Can't calm her mind down.   No recent counselors but has appt on 12/27 pending. Still out of work.  [place] asking about the [place] plan  Plan: DC buspirone Off label clonidine and increase to 0.1 mg BID for  severe anxiety and panic   06/29/2022 appointment noted: Current psychiatric medication clonidine 0.2 mg daily, Depakote ER 1500 nightly, alprazolam 0.5 mg twice daily as needed anxiety, olanzapine 20 mg nightly, paroxetine 60 mg daily, quetiapine 25 mg nightly Clonidine is the first time that seen a change with anxiety.  It immediately helps.  Checking BP and all systolic pressures above 998. Better response with BID dosing.  Less SOB.  Less irritable.   SE dry mouth, no others. Sleep is better  but still only 4-5 hours.   Depression is gradually getting better but not there yet. Back at work and clonidine helps her deal with the pressure better there.  Handling it better.  Work function is pretty good with constant volume.  Before was SOB bc couldn't relax to breathe. 2 therapy appts.   PDMP only shows Xanax.  She denies abusing substances  Past Psychiatric Medication Trials: Hydroxyzine, mirtazapine poor response for sleep,  Depakote,  Xanax, buspirone,  quetiapine low-dose for sleep Olanzapine 20 duloxetine, citalopram, Wellbutrin, fluoxetine, paroxetine 40  Notes and chart were reviewed with the patient regarding prior history and symptoms.  Review of Systems:  Review of Systems  Cardiovascular:  Negative for palpitations.  Gastrointestinal:  Negative for nausea.  Neurological:  Negative for dizziness and tremors.  Psychiatric/Behavioral:  Positive for dysphoric mood. Negative for decreased concentration and sleep disturbance. The patient is nervous/anxious.     Medications: I have reviewed the patient's current medications.  Current Outpatient Medications  Medication Sig Dispense Refill   ACCU-CHEK GUIDE test strip USE UP TO FOUR TIMES DAILY AS DIRECTED 400 strip 6   Accu-Chek Softclix Lancets lancets USE UP TO FOUR TIMES DAILY AS DIRECTED 100 each 12   aspirin 81 MG EC tablet Take 1 tablet (81 mg total) by mouth daily. Swallow whole. 30 tablet 12   atorvastatin  (LIPITOR) 80 MG tablet TAKE 1 TABLET BY MOUTH DAILY 90 tablet 3   BD PEN NEEDLE NANO U/F 32G X 4 MM MISC USE AS DIRECTED 300 each 1   blood glucose meter kit and supplies KIT Dispense based on patient and insurance preference. Use up to four times daily as directed. (FOR ICD-9 250.00, 250.01). 1 each 0  cilostazol (PLETAL) 100 MG tablet Take 1 tablet (100 mg total) by mouth 2 (two) times daily before a meal. 60 tablet 11   clopidogrel (PLAVIX) 75 MG tablet TAKE 1 TABLET BY MOUTH EVERY DAY WITH BREAKFAST 90 tablet 3   Continuous Blood Gluc Receiver (DEXCOM G6 RECEIVER) DEVI Use as directed 1 each 1   Continuous Blood Gluc Sensor (DEXCOM G6 SENSOR) MISC Use as directed 1 each 1   Continuous Blood Gluc Transmit (DEXCOM G6 TRANSMITTER) MISC Use as directed 1 each 1   divalproex (DEPAKOTE ER) 500 MG 24 hr tablet TAKE 3 TABLETS (1,500 MG TOTAL) BY MOUTH AT BEDTIME. 270 tablet 1   FARXIGA 10 MG TABS tablet TAKE 1 TABLET BY MOUTH DAILY BEFORE BREAKFAST 30 tablet 11   Glycerin-Hypromellose-PEG 400 (VISINE DRY EYE OP) Place 1 drop into both eyes 3 (three) times daily as needed (dry eyes).     Insulin Glargine (BASAGLAR KWIKPEN) 100 UNIT/ML INJECT 12 UNITS INTO THE SKIN DAILY 15 mL 3   isosorbide mononitrate (IMDUR) 120 MG 24 hr tablet Take 1 tablet (120 mg total) by mouth daily. 90 tablet 3   JANUVIA 50 MG tablet TAKE 1 TABLET BY MOUTH EVERY DAY 90 tablet 1   lisinopril (ZESTRIL) 10 MG tablet TAKE 1 TABLET BY MOUTH EVERY DAY 90 tablet 3   metoprolol succinate (TOPROL-XL) 25 MG 24 hr tablet TAKE 1 TABLET BY MOUTH EVERY DAY *KEEP APPOINTMENT FOR REFILLS* 90 tablet 3   mupirocin ointment (BACTROBAN) 2 % Apply 1 application. topically 2 (two) times daily. 30 g 2   nitroGLYCERIN (NITROSTAT) 0.4 MG SL tablet PLACE 1 TABLET UNDER THE TONGUE EVERY 5 MINUTES AS NEEDED FOR CHOEST PAIN 25 tablet 1   ranolazine (RANEXA) 500 MG 12 hr tablet Take 1 tablet (500 mg total) by mouth 2 (two) times daily. 180 tablet 3    Vitamin D, Ergocalciferol, (DRISDOL) 1.25 MG (50000 UNIT) CAPS capsule Take 1 capsule (50,000 Units total) by mouth every 7 (seven) days. 12 capsule 0   XIIDRA 5 % SOLN Place 1 drop into both eyes in the morning and at bedtime.     ALPRAZolam (XANAX) 0.5 MG tablet Take 1 tablet (0.5 mg total) by mouth 3 (three) times daily as needed for anxiety. 90 tablet 1   cloNIDine (CATAPRES) 0.1 MG tablet 1 in the AM and 3 PM and 1 extra daily as needed for anxiety 270 tablet 0   OLANZapine (ZYPREXA) 20 MG tablet Take 1 tablet (20 mg total) by mouth at bedtime. 90 tablet 0   PARoxetine (PAXIL) 20 MG tablet Take 3 tablets (60 mg total) by mouth daily. As of 04/08/22 270 tablet 0   No current facility-administered medications for this visit.    Medication Side Effects: ? 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   Qualifier: Diagnosis of  By: Garen Grams     Diabetes mellitus type 2 in nonobese ([place]) 07/2016   Dyslipidemia    Headache(784.0)    History of pulmonary embolism 07/08/2020   HTN (hypertension)    Migraine    NSTEMI (non-ST elevated myocardial infarction) ([place])    PAD (peripheral artery disease) ([place])  Pulmonary embolus ([facility])    unprovoked    Family History  Problem Relation Age of Onset   Lupus Mother    Heart attack Father    Diabetes Father    Hypertension Father    Diabetes Paternal Grandmother    Hypertension Paternal Grandmother    Stroke Neg Hx    Kidney disease Neg Hx    Hyperlipidemia Neg Hx    Sudden death Neg Hx     Social History   Socioeconomic History   Marital status: Legally Separated    Spouse name: Not on file   Number of children: Not on file   Years of  education: Not on file   Highest education level: Not on file  Occupational History   Occupation: mortgage loan specialist  Tobacco Use   Smoking status: Some Days    Years: 25.00    Types: Cigarettes   Smokeless tobacco: Never   Tobacco comments:    0.5 pack cigarettes  per month per patient   Vaping Use   Vaping Use: Never used  Substance and Sexual Activity   Alcohol use: No    Alcohol/week: 0.0 standard drinks of alcohol   Drug use: Never   Sexual activity: Yes    Partners: Male    Comment: married  Other Topics Concern   Not on file  Social History Narrative   Regular exercise:  3 days weekly   Caffeine Use:  1 soda daily   One child biological daughter born in 55 and an adopted niece.   [place] specialist   Married- may be divorcing.          Social Determinants of Health   Financial Resource Strain: Not on file  Food Insecurity: Not on file  Transportation Needs: Not on file  Physical Activity: Not on file  Stress: Not on file  Social Connections: Not on file  Intimate Partner Violence: Not on file    Past Medical History, Surgical history, Social history, and Family history were reviewed and updated as appropriate.   Please see review of systems for further details on the patient's review from today.   Objective:   Physical Exam:  BP 105/68   Pulse 75   LMP 11/28/2009   Physical Exam Constitutional:      General: She is not in acute distress.    Appearance: She is well-developed.  Musculoskeletal:        General: No deformity.  Neurological:     Mental Status: She is alert and oriented to person, place, and time.     Coordination: Coordination normal.  Psychiatric:        Attention and Perception: She is attentive. She does not perceive auditory hallucinations.        Mood and Affect: Mood is anxious and depressed. Affect is not labile, blunt, angry or tearful.        Speech: Speech normal. Speech is not rapid and pressured.         Behavior: Behavior normal. Behavior is not agitated.        Thought Content: Thought content normal. Thought content is not paranoid or delusional. Thought content does not include homicidal or suicidal ideation.        Cognition and Memory: Cognition normal.        Judgment: Judgment normal.     Comments: Insight intact. No auditory or visual hallucinations. No delusions.       Lab Review:     Component  Value Date/Time   NA 138 02/25/2022 0950   NA 141 07/23/2020 1300   K 4.4 02/25/2022 0950   CL 106 02/25/2022 0950   CO2 26 02/25/2022 0950   GLUCOSE 89 02/25/2022 0950   BUN 18 02/25/2022 0950   BUN 7 07/23/2020 1300   CREATININE 1.17 02/25/2022 0950   CREATININE 0.72 06/22/2012 0840   CALCIUM 9.6 02/25/2022 0950   PROT 6.6 03/29/2022 1504   ALBUMIN 4.6 03/29/2022 1504   AST 14 03/29/2022 1504   ALT 9 03/29/2022 1504   ALKPHOS 152 (H) 03/29/2022 1504   BILITOT 0.4 03/29/2022 1504   GFRNONAA >60 05/06/2021 0351   GFRNONAA >89 06/22/2012 0840   GFRAA 102 07/23/2020 1300   GFRAA >89 06/22/2012 0840       Component Value Date/Time   WBC 6.1 02/25/2022 0950   RBC 3.99 02/25/2022 0950   HGB 12.3 02/25/2022 0950   HCT 36.5 02/25/2022 0950   PLT 187.0 02/25/2022 0950   MCV 91.4 02/25/2022 0950   MCH 29.6 05/07/2021 0149   MCHC 33.8 02/25/2022 0950   RDW 14.4 02/25/2022 0950   LYMPHSABS 2.2 02/25/2022 0950   MONOABS 0.4 02/25/2022 0950   EOSABS 0.3 02/25/2022 0950   BASOSABS 0.0 02/25/2022 0950    No results found for: "POCLITH", "LITHIUM"   No results found for: "PHENYTOIN", "PHENOBARB", "VALPROATE", "CBMZ"   .res Assessment: Plan:    Bailey Greene was seen today for follow-up and anxiety.  Diagnoses and all orders for this visit:  Moderate mixed bipolar I disorder ([facility]) -     OLANZapine (ZYPREXA) 20 MG tablet; Take 1 tablet (20 mg total) by mouth at bedtime.  Generalized anxiety disorder -     cloNIDine (CATAPRES) 0.1 MG tablet; 1 in the AM and 3 PM  and 1 extra daily as needed for anxiety -     OLANZapine (ZYPREXA) 20 MG tablet; Take 1 tablet (20 mg total) by mouth at bedtime. -     PARoxetine (PAXIL) 20 MG tablet; Take 3 tablets (60 mg total) by mouth daily. As of 04/08/22  Panic disorder with agoraphobia -     cloNIDine (CATAPRES) 0.1 MG tablet; 1 in the AM and 3 PM and 1 extra daily as needed for anxiety -     OLANZapine (ZYPREXA) 20 MG tablet; Take 1 tablet (20 mg total) by mouth at bedtime. -     PARoxetine (PAXIL) 20 MG tablet; Take 3 tablets (60 mg total) by mouth daily. As of 04/08/22 -     ALPRAZolam (XANAX) 0.5 MG tablet; Take 1 tablet (0.5 mg total) by mouth 3 (three) times daily as needed for anxiety.  Insomnia due to mental condition  Chronic work stress  Greater than 50% of 30 min face to face time with patient was spent on counseling and coordination of care. We discussed the following: she and her husband have both noted significant improvement and her level of anxiety and she is less depressed with the addition of olanzapine and the increased to 20 mg daily.  It is also a mood stabilizer.  It also can help with depression when added to an SSRI.  We discussed side effects in detail.  This is the usual max dose. Consider further increase olanzapine if necessary Continue olanzapine 20 mg 3 hours before HS. Continue Depakote ER 1500 mg HS  Off label clonidine 0.1 mg BID for severe anxiety and panic has helped reduce anxiety by over 50% She didn't each or drink much  today and BP borderline wihtout  SX.  Push fluids.  Increased paroxetine to 60 mg daily as of 04/08/22 for worsening panic. No better so far with increased dose.  BC failure of alternatives, alprazolam 0.5 mg AM before work twice daily.  Alternatives clonazepam, Ativan  Discussed potential metabolic side effects associated with atypical antipsychotics, as well as potential risk for movement side effects. Advised pt to contact office if movement side effects  occur.  Metabolic side effects are unlikely at this low dose of quetiapine and almost no risk of EPS at this low-dose. DM managed  We discussed the short-term risks associated with benzodiazepines including sedation and increased fall risk among others.  Discussed long-term side effect risk including dependence, potential withdrawal symptoms, and the potential eventual dose-related risk of dementia.  But recent studies from 2020 dispute this association between benzodiazepines and dementia risk. Newer studies in 2020 do not support an association with dementia. If too sleepy with it call and will call if needed to switch to lorazepam.  continue counseling [facility]  Has RTW and doing ok right now  This appt was 30 mins.   Follow-up Dec  Meredith Staggers MD, DFAPA  Please see After Visit Summary for patient specific instructions.   Future Appointments  Date Time Provider Department Center  07/06/2022  1:00 PM Sandford Craze, NP [facility]  07/16/2022  1:00 PM Deneise Lever, LMFT [facility] None  07/20/2022  1:00 PM [facility] - EM [facility]  07/20/2022  1:20 PM Leonie Douglas, MD [facility]  07/26/2022  2:15 PM [facility]  08/31/2022  3:00 PM Cottle, Steva Ready., MD [facility] None    No orders of the defined types were placed in this encounter.      -------------------------------

## 2022-06-30 ENCOUNTER — Ambulatory Visit: Payer: 59 | Admitting: Behavioral Health

## 2022-07-02 ENCOUNTER — Other Ambulatory Visit: Payer: Self-pay | Admitting: Family

## 2022-07-05 ENCOUNTER — Other Ambulatory Visit: Payer: Self-pay | Admitting: Family

## 2022-07-06 ENCOUNTER — Telehealth: Payer: Self-pay | Admitting: Family

## 2022-07-06 ENCOUNTER — Ambulatory Visit: Payer: 59 | Admitting: Family

## 2022-07-06 VITALS — BP 95/66 | HR 70 | Temp 98.5°F | Resp 16 | Wt 130.0 lb

## 2022-07-06 DIAGNOSIS — I2510 Atherosclerotic heart disease of native coronary artery without angina pectoris: Secondary | ICD-10-CM | POA: Diagnosis not present

## 2022-07-06 DIAGNOSIS — Z23 Encounter for immunization: Secondary | ICD-10-CM | POA: Diagnosis not present

## 2022-07-06 DIAGNOSIS — E119 Type 2 diabetes mellitus without complications: Secondary | ICD-10-CM

## 2022-07-06 DIAGNOSIS — I10 Essential (primary) hypertension: Secondary | ICD-10-CM

## 2022-07-06 DIAGNOSIS — E559 Vitamin D deficiency, unspecified: Secondary | ICD-10-CM

## 2022-07-06 DIAGNOSIS — I739 Peripheral vascular disease, unspecified: Secondary | ICD-10-CM

## 2022-07-06 DIAGNOSIS — Z720 Tobacco use: Secondary | ICD-10-CM

## 2022-07-06 LAB — COMPREHENSIVE METABOLIC PANEL
ALT: 15 U/L (ref 0–35)
AST: 18 U/L (ref 0–37)
Albumin: 4.7 g/dL (ref 3.5–5.2)
Alkaline Phosphatase: 123 U/L — ABNORMAL HIGH (ref 39–117)
BUN: 20 mg/dL (ref 6–23)
CO2: 24 mEq/L (ref 19–32)
Calcium: 9.6 mg/dL (ref 8.4–10.5)
Chloride: 103 mEq/L (ref 96–112)
Creatinine, Ser: 1.13 mg/dL (ref 0.40–1.20)
GFR: 53.22 mL/min — ABNORMAL LOW (ref >60.00)
Glucose, Bld: 51 mg/dL — ABNORMAL LOW (ref 70–99)
Potassium: 4.5 mEq/L (ref 3.5–5.1)
Sodium: 138 mEq/L (ref 135–145)
Total Bilirubin: 0.6 mg/dL (ref 0.2–1.2)
Total Protein: 7 g/dL (ref 6.0–8.3)

## 2022-07-06 LAB — HEMOGLOBIN A1C: Hgb A1c MFr Bld: 6.7 % — ABNORMAL HIGH (ref 4.6–6.5)

## 2022-07-06 LAB — VITAMIN D 25 HYDROXY (VIT D DEFICIENCY, FRACTURES): VITD: 54.56 ng/mL (ref 30.00–100.00)

## 2022-07-06 MED ORDER — LISINOPRIL 5 MG PO TABS: 5.0000 mg | ORAL_TABLET | Freq: Every day | ORAL | 1 refills | Status: DC

## 2022-07-06 NOTE — Assessment & Plan Note (Signed)
>>  ASSESSMENT AND PLAN FOR CAD (CORONARY ARTERY DISEASE) WRITTEN ON 07/06/2022  1:12 PM BY O'SULLIVAN, Shelvy Perazzo, NP  She is up to date with cardiology.

## 2022-07-06 NOTE — Telephone Encounter (Signed)
Please call [facility] and request a copy of last mammogram.

## 2022-07-06 NOTE — Assessment & Plan Note (Signed)
Down to 2 or less cigarettes/day. Encouraged complete cessation.

## 2022-07-06 NOTE — Telephone Encounter (Signed)
Records release faxed 

## 2022-07-06 NOTE — Assessment & Plan Note (Signed)
>>  ASSESSMENT AND PLAN FOR ESSENTIAL HYPERTENSION WRITTEN ON 07/06/2022  1:12 PM BY O'SULLIVAN, Treyveon Mochizuki, NP  BP Readings from Last 3 Encounters:  07/06/22 95/66  05/04/22 104/64  04/07/22 103/73   BP appears overtreated.  Will decrease lisinopril  to 5mg  once daily. Continue metoprolol .

## 2022-07-06 NOTE — Assessment & Plan Note (Signed)
Lab Results  Component Value Date   HGBA1C 6.6 (H) 02/25/2022   HGBA1C 6.6 (H) 11/03/2021   HGBA1C 7.0 (H) 08/11/2021   Lab Results  Component Value Date   MICROALBUR <0.7 02/25/2022   LDLCALC 77 11/16/2021   CREATININE 1.17 02/25/2022   Maintained on januvia.  Wt Readings from Last 3 Encounters:  07/06/22 130 lb (59 kg)  05/04/22 135 lb (61.2 kg)  04/07/22 133 lb (60.3 kg)   Clinically stable on januvia. Obtain A1C.

## 2022-07-06 NOTE — Assessment & Plan Note (Signed)
BP Readings from Last 3 Encounters:  07/06/22 95/66  05/04/22 104/64  04/07/22 103/73   BP appears overtreated.  Will decrease lisinopril to 5mg  once daily. Continue metoprolol.

## 2022-07-06 NOTE — Progress Notes (Signed)
Subjective:   By signing my name below, I, Bailey Greene, attest that this documentation has been prepared under the direction and in the presence of Bailey Alar, NP. 07/06/2022.   Patient ID: Bailey Greene, female    DOB: 05/31/1961, 62 y.o.   MRN: [id number]  Chief Complaint  Patient presents with   Anxiety    Here for follow up   Vitamin D deficiency    Here for follow up    HPI Patient is in today for an office visit.  Mood: Since November she has seen psychiatry for 2 visits; her next visit is next week. She reports that Dr. Clovis Pu added clonidine and dropped a different medication, she is unsure which.  Diet: She feels as though she doesn't always eat sufficiently, or sometimes not eating the right foods. Her weight is down a few lbs. Her A1c in September was 6.6. We will recheck this today. Her pharmacy no longer covers [place], so we changed this to [place] 12 units daily.  Wt Readings from Last 3 Encounters:  07/06/22 130 lb (59 kg)  05/04/22 135 lb (61.2 kg)  04/07/22 133 lb (60.3 kg)   Blood Pressure: In clinic today her blood pressure is 95/66 on metoprolol and lisinopril. She monitors this daily at home. BP Readings from Last 3 Encounters:  07/06/22 95/66  05/04/22 104/64  04/07/22 103/73   Leg Pain: She reports that her previous LE pain has resolved.  Social History: Currently she is working from home. This is going well.  Supplements: Current supplements include vitamin D (Drisdol) which she is taking once a week.  Tobacco Use: She continues to work on quitting. She is now smoking 2 or less cigarettes a day.  Vaccinations: She confirms receiving her influenza vaccination in early November. We will update her tetanus vaccine today.  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) 07/2016   Dyslipidemia    Headache(784.0)    History of pulmonary embolism 07/08/2020   HTN (hypertension)    Migraine    NSTEMI (non-ST elevated myocardial infarction) ([place])    PAD (peripheral artery disease) ([place])    Pulmonary embolus ([place])    unprovoked    Past Surgical History:  Procedure Laterality Date   BUNIONECTOMY  08/2011   right foot   CARDIAC CATHETERIZATION  2007   severe native 3 v dz, all grafts patent (LIMA-LAD, RIMA-Diag, SVG-OM1-OM2, R radial-PDA)   CESAREAN SECTION  1984   CORONARY ARTERY BYPASS GRAFT  2006    Coronary artery bypass grafting x5 with a left  internal  mammary to the left anterior descending coronary artery.  Free right  internal mammary to the diagonal coronary artery, sequential reverse  saphenous vein graft to the first and second obtuse marginal, right  artery bypass to the posterior descending coronary artery with endo-vein harvesting.   LEFT HEART CATH AND CORS/GRAFTS ANGIOGRAPHY N/A 05/05/2021   Procedure: LEFT HEART CATH AND CORS/GRAFTS ANGIOGRAPHY;  Surgeon: Leonie Man, MD;  Location: [facility]  INVASIVE CV LAB;  Service: Cardiovascular;  Laterality: N/A;   UMBILICAL HERNIA REPAIR N/A 01/16/2021   Procedure: UMBILICAL  HERNIA REPAIR;  Surgeon: Dwan Bolt, MD;  Location: [place];  Service: General;  Laterality: N/A;    Family History  Problem Relation Age of Onset   Lupus Mother    Heart attack Father    Diabetes Father    Hypertension Father    Diabetes Paternal Grandmother    Hypertension Paternal Grandmother    Stroke Neg Hx    Kidney disease Neg Hx    Hyperlipidemia Neg Hx    Sudden death Neg Hx     Social  History   Socioeconomic History   Marital status: Legally Separated    Spouse name: Not on file   Number of children: Not on file   Years of education: Not on file   Highest education level: Not on file  Occupational History   Occupation: mortgage loan specialist  Tobacco Use   Smoking status: Some Days    Years: 25.00    Types: Cigarettes   Smokeless tobacco: Never   Tobacco comments:    0.5 pack cigarettes  per month per patient   Vaping Use   Vaping Use: Never used  Substance and Sexual Activity   Alcohol use: No    Alcohol/week: 0.0 standard drinks of alcohol   Drug use: Never   Sexual activity: Yes    Partners: Male    Comment: married  Other Topics Concern   Not on file  Social History Narrative   Regular exercise:  3 days weekly   Caffeine Use:  1 soda daily   One child biological daughter born in 74 and an adopted niece.   [employer] specialist   Married- may be divorcing.          Social Determinants of Radio broadcast assistant Strain: Not on file  Food Insecurity: Not on file  Transportation Needs: Not on file  Physical Activity: Not on file  Stress: Not on file  Social Connections: Not on file  Intimate Partner Violence: Not on file    Outpatient Medications Prior to Visit  Medication Sig Dispense Refill   ACCU-CHEK GUIDE test strip USE UP TO FOUR TIMES DAILY AS DIRECTED 400 strip 6   Accu-Chek Softclix Lancets lancets USE UP TO FOUR TIMES DAILY AS DIRECTED 100 each 12   ALPRAZolam (XANAX) 0.5 MG tablet Take 1 tablet (0.5 mg total) by mouth 3 (three) times daily as needed for anxiety. 90 tablet 1   aspirin 81 MG EC tablet Take 1 tablet (81 mg total) by mouth daily. Swallow whole. 30 tablet 12   atorvastatin (LIPITOR) 80 MG tablet TAKE 1 TABLET BY MOUTH DAILY 90 tablet 3   BD PEN NEEDLE NANO U/F 32G X 4 MM MISC USE AS DIRECTED 300 each 1   blood glucose meter kit and supplies KIT Dispense based on patient and insurance preference.  Use up to four times daily as directed. (FOR ICD-9 250.00, 250.01). 1 each 0   cilostazol (PLETAL) 100 MG tablet Take 1 tablet (100 mg total) by mouth 2 (two) times daily before a meal. 60 tablet 11   cloNIDine (CATAPRES) 0.1 MG tablet 1 in the AM and 3 PM and 1 extra daily as needed for anxiety 270 tablet 0   clopidogrel (PLAVIX) 75 MG tablet TAKE 1 TABLET BY MOUTH EVERY DAY WITH BREAKFAST 90 tablet 3   Continuous Blood Gluc  Receiver (DEXCOM G6 RECEIVER) DEVI Use as directed 1 each 1   Continuous Blood Gluc Sensor (DEXCOM G6 SENSOR) MISC Use as directed 1 each 1   Continuous Blood Gluc Transmit (DEXCOM G6 TRANSMITTER) MISC Use as directed 1 each 1   divalproex (DEPAKOTE ER) 500 MG 24 hr tablet TAKE 3 TABLETS (1,500 MG TOTAL) BY MOUTH AT BEDTIME. 270 tablet 1   FARXIGA 10 MG TABS tablet TAKE 1 TABLET BY MOUTH DAILY BEFORE BREAKFAST 30 tablet 11   Glycerin-Hypromellose-PEG 400 (VISINE DRY EYE OP) Place 1 drop into both eyes 3 (three) times daily as needed (dry eyes).     insulin degludec (TRESIBA FLEXTOUCH) 100 UNIT/ML FlexTouch Pen Inject 12 Units into the skin daily. 3 mL 5   isosorbide mononitrate (IMDUR) 120 MG 24 hr tablet Take 1 tablet (120 mg total) by mouth daily. 90 tablet 3   JANUVIA 50 MG tablet TAKE 1 TABLET BY MOUTH EVERY DAY 90 tablet 1   metoprolol succinate (TOPROL-XL) 25 MG 24 hr tablet TAKE 1 TABLET BY MOUTH EVERY DAY *KEEP APPOINTMENT FOR REFILLS* 90 tablet 3   mupirocin ointment (BACTROBAN) 2 % Apply 1 application. topically 2 (two) times daily. 30 g 2   nitroGLYCERIN (NITROSTAT) 0.4 MG SL tablet PLACE 1 TABLET UNDER THE TONGUE EVERY 5 MINUTES AS NEEDED FOR CHOEST PAIN 25 tablet 1   OLANZapine (ZYPREXA) 20 MG tablet Take 1 tablet (20 mg total) by mouth at bedtime. 90 tablet 0   PARoxetine (PAXIL) 20 MG tablet Take 3 tablets (60 mg total) by mouth daily. As of 04/08/22 270 tablet 0   ranolazine (RANEXA) 500 MG 12 hr tablet Take 1 tablet (500 mg total) by mouth 2 (two) times daily.  180 tablet 3   XIIDRA 5 % SOLN Place 1 drop into both eyes in the morning and at bedtime.     lisinopril (ZESTRIL) 10 MG tablet TAKE 1 TABLET BY MOUTH EVERY DAY 90 tablet 3   Vitamin D, Ergocalciferol, (DRISDOL) 1.25 MG (50000 UNIT) CAPS capsule Take 1 capsule (50,000 Units total) by mouth every 7 (seven) days. 12 capsule 0   No facility-administered medications prior to visit.    Allergies  Allergen Reactions   Metformin And Related Nausea And Vomiting    Review of Systems  Constitutional:  Negative for fever.  HENT:  Negative for congestion, sinus pain and sore throat.   Respiratory:  Negative for cough, shortness of breath and wheezing.   Cardiovascular:  Negative for chest pain and palpitations.  Gastrointestinal:  Negative for blood in stool, constipation, diarrhea, nausea and vomiting.  Genitourinary:  Negative for dysuria, frequency and hematuria.  Musculoskeletal:  Negative for joint pain and myalgias.       Objective:    Physical Exam Constitutional:      Appearance: Normal appearance.  HENT:     Head: Normocephalic and atraumatic.     Right Ear: Tympanic membrane, ear canal and external ear normal.     Left Ear: Tympanic membrane, ear canal and external ear normal.  Eyes:     Extraocular Movements: Extraocular movements intact.     Pupils: Pupils are equal, round, and reactive to light.  Cardiovascular:     Rate and Rhythm: Normal rate and regular rhythm.     Heart sounds: Normal heart sounds. No murmur heard.    No gallop.  Pulmonary:     Effort: Pulmonary effort is normal. No respiratory distress.     Breath sounds: Normal breath sounds.  No wheezing or rales.  Skin:    General: Skin is warm and dry.  Neurological:     General: No focal deficit present.     Mental Status: She is alert and oriented to person, place, and time.  Psychiatric:        Mood and Affect: Mood normal.        Behavior: Behavior normal.     BP 95/66 (BP Location: Right Arm,  Patient Position: Sitting, Cuff Size: Small)   Pulse 70   Temp 98.5 F (36.9 C) (Oral)   Resp 16   Wt 130 lb (59 kg)   LMP 11/28/2009   SpO2 100%   BMI 22.31 kg/m  Wt Readings from Last 3 Encounters:  07/06/22 130 lb (59 kg)  05/04/22 135 lb (61.2 kg)  04/07/22 133 lb (60.3 kg)      Assessment & Plan:   Problem List Items Addressed This Visit       Unprioritized   Tobacco abuse    Down to 2 or less cigarettes/day. Encouraged complete cessation.       PVD (peripheral vascular disease) (HCC)    Clinically stable/improve. Continues plavix.  Management per vascular.       Relevant Medications   lisinopril (ZESTRIL) 5 MG tablet   Essential hypertension    BP Readings from Last 3 Encounters:  07/06/22 95/66  05/04/22 104/64  04/07/22 103/73  BP appears overtreated.  Will decrease lisinopril to 5mg  once daily. Continue metoprolol.       Relevant Medications   lisinopril (ZESTRIL) 5 MG tablet   Diabetes mellitus type 2 in nonobese [facility]) - Primary    Lab Results  Component Value Date   HGBA1C 6.6 (H) 02/25/2022   HGBA1C 6.6 (H) 11/03/2021   HGBA1C 7.0 (H) 08/11/2021   Lab Results  Component Value Date   MICROALBUR <0.7 02/25/2022   LDLCALC 77 11/16/2021   CREATININE 1.17 02/25/2022  Maintained on januvia.  Wt Readings from Last 3 Encounters:  07/06/22 130 lb (59 kg)  05/04/22 135 lb (61.2 kg)  04/07/22 133 lb (60.3 kg)  Clinically stable on januvia. Obtain A1C.        Relevant Medications   lisinopril (ZESTRIL) 5 MG tablet   Other Relevant Orders   HgB A1c (Completed)   Comp Met (CMET) (Completed)   CAD (coronary artery disease)    She is up to date with cardiology.       Relevant Medications   lisinopril (ZESTRIL) 5 MG tablet   Other Visit Diagnoses     Vitamin D deficiency       Relevant Orders   VITAMIN D 25 Hydroxy (Vit-D Deficiency, Fractures) (Completed)   Need for Td vaccine       Relevant Orders   Td : Tetanus/diphtheria >7yo  Preservative  free (Completed)        Meds ordered this encounter  Medications   lisinopril (ZESTRIL) 5 MG tablet    Sig: Take 1 tablet (5 mg total) by mouth daily.    Dispense:  90 tablet    Refill:  1    Order Specific Question:   Supervising Provider    Answer:   Penni Homans A [[id number]]    I, Nance Pear, NP, personally preformed the services described in this documentation.  All medical record entries made by the scribe were at my direction and in my presence.  I have reviewed the chart and discharge instructions (if applicable) and agree that the  record reflects my personal performance and is accurate and complete. 07/06/2022.  I,Mathew Stumpf,acting as a Education administrator for [employer], NP.,have documented all relevant documentation on the behalf of Nance Pear, NP,as directed by  Nance Pear, NP while in the presence of Nance Pear, NP.   Nance Pear, NP

## 2022-07-06 NOTE — Assessment & Plan Note (Signed)
Clinically stable/improve. Continues plavix.  Management per vascular.

## 2022-07-06 NOTE — Assessment & Plan Note (Signed)
She is up to date with cardiology.

## 2022-07-07 ENCOUNTER — Encounter: Payer: Self-pay | Admitting: Family

## 2022-07-07 ENCOUNTER — Other Ambulatory Visit: Payer: Self-pay | Admitting: Family

## 2022-07-07 MED ORDER — VITAMIN D3 75 MCG (3000 UT) PO TABS: 1.0000 | ORAL_TABLET | Freq: Every day | ORAL | Status: AC

## 2022-07-16 ENCOUNTER — Ambulatory Visit (INDEPENDENT_AMBULATORY_CARE_PROVIDER_SITE_OTHER): Payer: 59 | Admitting: Behavioral Health

## 2022-07-16 DIAGNOSIS — F3162 Bipolar disorder, current episode mixed, moderate: Secondary | ICD-10-CM

## 2022-07-16 DIAGNOSIS — F411 Generalized anxiety disorder: Secondary | ICD-10-CM | POA: Diagnosis not present

## 2022-07-16 DIAGNOSIS — F5105 Insomnia due to other mental disorder: Secondary | ICD-10-CM | POA: Diagnosis not present

## 2022-07-16 DIAGNOSIS — F4001 Agoraphobia with panic disorder: Secondary | ICD-10-CM

## 2022-07-16 NOTE — Progress Notes (Signed)
                Marithza Malachi L Hubbard [place], LMFT 

## 2022-07-16 NOTE — Progress Notes (Signed)
[facility] Counselor/Therapist Progress Note  Patient ID: Bailey Greene, MRN: [id number],    Date: 07/16/2022  Time Spent: 51 min Caregility video; Pt is home in private & Provider working remote from [employer]   Treatment Type: Individual Therapy  Reported Symptoms: Reduction in panic attacks to ~ 12 times/week. Husb's hoarding beh is making her anxiety worse.  Mental Status Exam: Appearance:  Casual     Behavior: Appropriate and Sharing  Motor: Normal  Speech/Language:  Clear and Coherent  Affect: Appropriate  Mood: normal  Thought process: normal  Thought content:   WNL  Sensory/Perceptual disturbances:   WNL  Orientation: oriented to person, place, and time/date  Attention: Good  Concentration: Good  Memory: WNL  Fund of knowledge:  Good  Insight:   Good  Judgment:  Good  Impulse Control: Good   Risk Assessment: Danger to Self:  No Self-injurious Behavior: No Danger to Others: No Duty to Warn:no Physical Aggression / Violence:No  Access to Firearms a concern: No  Gang Involvement:No   Subjective: Pt is concerned for the health of her marriage. She wants to encourage her Husb to attend a Cpl Th session so they can make the marriage healthier.  Interventions: Cognitive Behavioral Therapy and Psycho-education/Bibliotherapy  Diagnosis:Moderate mixed bipolar I disorder (HCC)  Generalized anxiety disorder  Panic disorder with agoraphobia  Insomnia due to mental condition  Plan: Bailey Greene is upset over the health of her marriage. She will speak w/her Donzetta Kohut about coming In Person to our next session.  Target Date: 08/04/2022  Progress: 4  Frequency: Twice monthly  Modality: Boykin Reaper, LMFT

## 2022-07-20 ENCOUNTER — Ambulatory Visit: Payer: 59 | Admitting: Vascular Surgery

## 2022-07-20 ENCOUNTER — Ambulatory Visit (HOSPITAL_COMMUNITY): Payer: 59

## 2022-07-24 ENCOUNTER — Other Ambulatory Visit: Payer: Self-pay | Admitting: Psychiatry

## 2022-07-24 DIAGNOSIS — F3162 Bipolar disorder, current episode mixed, moderate: Secondary | ICD-10-CM

## 2022-07-24 DIAGNOSIS — F4001 Agoraphobia with panic disorder: Secondary | ICD-10-CM

## 2022-07-24 DIAGNOSIS — F411 Generalized anxiety disorder: Secondary | ICD-10-CM

## 2022-07-25 ENCOUNTER — Other Ambulatory Visit: Payer: Self-pay | Admitting: Psychiatry

## 2022-07-25 DIAGNOSIS — F411 Generalized anxiety disorder: Secondary | ICD-10-CM

## 2022-07-25 DIAGNOSIS — F4001 Agoraphobia with panic disorder: Secondary | ICD-10-CM

## 2022-07-25 DIAGNOSIS — F3162 Bipolar disorder, current episode mixed, moderate: Secondary | ICD-10-CM

## 2022-07-26 ENCOUNTER — Other Ambulatory Visit: Payer: 59

## 2022-08-03 ENCOUNTER — Other Ambulatory Visit: Payer: Self-pay | Admitting: Cardiology

## 2022-08-04 ENCOUNTER — Ambulatory Visit: Payer: 59 | Admitting: Behavioral Health

## 2022-08-08 ENCOUNTER — Other Ambulatory Visit: Payer: Self-pay | Admitting: Cardiology

## 2022-08-10 ENCOUNTER — Ambulatory Visit (HOSPITAL_COMMUNITY)
Admission: RE | Admit: 2022-08-10 | Discharge: 2022-08-10 | Disposition: A | Discharge status: Home / Self Care | Payer: 59 | Source: Ambulatory Visit | Attending: Vascular Surgery | Admitting: Vascular Surgery

## 2022-08-10 ENCOUNTER — Encounter: Payer: Self-pay | Admitting: Vascular Surgery

## 2022-08-10 ENCOUNTER — Ambulatory Visit: Payer: 59 | Admitting: Vascular Surgery

## 2022-08-10 VITALS — BP 89/57 | HR 66 | Temp 98.3°F | Resp 20 | Ht 64.0 in | Wt 130.0 lb

## 2022-08-10 DIAGNOSIS — I70213 Atherosclerosis of native arteries of extremities with intermittent claudication, bilateral legs: Secondary | ICD-10-CM

## 2022-08-10 DIAGNOSIS — I739 Peripheral vascular disease, unspecified: Secondary | ICD-10-CM | POA: Insufficient documentation

## 2022-08-11 ENCOUNTER — Other Ambulatory Visit: Payer: Self-pay

## 2022-08-11 DIAGNOSIS — I739 Peripheral vascular disease, unspecified: Secondary | ICD-10-CM

## 2022-08-11 LAB — VAS US ABI WITH/WO TBI
Left ABI: 0.48
Right ABI: 0.42

## 2022-08-13 NOTE — Progress Notes (Unsigned)
[facility] [place]  ASSESSMENT / PLAN: [name] is a 62 y.o. 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 Intervention should only be considered for medically optimized patients with disabling symptoms.   Recommend the following which can slow the progression of atherosclerosis and reduce the risk of major adverse cardiac / limb events:  Complete cessation from all tobacco products. Blood glucose control with goal A1c < 7%. Blood pressure control with goal blood pressure < 140/90 mmHg. Lipid reduction therapy with goal LDL-C <100 mg/dL (<70 if symptomatic from PAD).  Aspirin 81mg  PO QD.  Atorvastatin 40-80mg  PO QD (or other "high intensity" statin therapy). Daily walking to and past the point of discomfort. Patient counseled to keep a log of exercise distance.  Her symptoms are stable. She has not yet completely quit smoking but is close. Follow up in 3 months with repeat ABI.  I counseled her if her symptoms are bothersome enough a revascularization is reasonable.  CHIEF COMPLAINT: Peripheral arterial disease demonstrated on recent angiogram.  HISTORY OF PRESENT ILLNESS: Bailey Greene is a 62 y.o. female for to clinic after recent cardiac catheterization demonstrated calcified common femoral artery and occluded right superficial femoral artery.  The patient is relatively asymptomatic from a peripheral arterial disease standpoint.  She reports some discomfort and a sensation of "heaviness" in her calves after walking for more than 30 minutes.  She is able to do all of her independent activities of daily living without difficulty.  She can shop through a grocery store without discomfort in her calves.  She  does not report symptoms of rest pain.  She does not have any ischemic ulceration about her feet.  04/06/22: She returns for surveillance.  She thinks her symptoms are getting a bit worse.  She continues to smoke sporadically.  She has no ischemic rest pain symptoms.  She has no ulcers about her feet.  08/10/22: returns to clinic. Symptoms are about the same. Reduced cigarette use - <1cig/day. No rest pain. No ulcers.  Past Medical History:  Diagnosis Date   Anxiety    Bipolar affective disorder ([place])    CAD (coronary artery disease) 2006   CABG w/ LIMA-LAD, RIMA-Diag, SVG-OM1-OM2, R radial-PDA   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   Qualifier: Diagnosis of  By: Bailey Greene     Diabetes mellitus type 2 in nonobese ([place]) 07/2016   Dyslipidemia    Headache(784.0)    History of pulmonary embolism 07/08/2020   HTN (hypertension)    Migraine    NSTEMI (non-ST elevated myocardial infarction) ([place])    PAD (peripheral artery disease) ([place])    Pulmonary embolus ([place])    unprovoked    Past Surgical History:  Procedure Laterality Date   BUNIONECTOMY  08/2011   right foot   CARDIAC CATHETERIZATION  2007   severe native 3 v dz, all grafts patent (LIMA-LAD, RIMA-Diag, SVG-OM1-OM2, R radial-PDA)   CESAREAN SECTION  1984   CORONARY ARTERY BYPASS GRAFT  2006    Coronary artery bypass grafting x5 with a left  internal  mammary to the left anterior descending coronary artery.  Free right  internal mammary to the diagonal coronary artery, sequential reverse  saphenous vein graft to the first and second obtuse marginal, right  artery bypass to  the posterior descending coronary artery with endo-vein harvesting.   LEFT HEART CATH AND CORS/GRAFTS ANGIOGRAPHY N/A 05/05/2021   Procedure: LEFT HEART CATH AND CORS/GRAFTS ANGIOGRAPHY;  Surgeon: Leonie Man, MD;  Location: [facility];  Service: Cardiovascular;  Laterality: N/A;   UMBILICAL HERNIA REPAIR N/A 01/16/2021   Procedure: UMBILICAL  HERNIA  REPAIR;  Surgeon: Dwan Bolt, MD;  Location: [facility];  Service: General;  Laterality: N/A;    Family History  Problem Relation Age of Onset   Lupus Mother    Heart attack Father    Diabetes Father    Hypertension Father    Diabetes Paternal Grandmother    Hypertension Paternal Grandmother    Stroke Neg Hx    Kidney disease Neg Hx    Hyperlipidemia Neg Hx    Sudden death Neg Hx     Social History   Socioeconomic History   Marital status: Legally Separated    Spouse name: Not on file   Number of children: Not on file   Years of education: Not on file   Highest education level: Not on file  Occupational History   Occupation: mortgage loan specialist  Tobacco Use   Smoking status: Some Days    Years: 25    Types: Cigarettes   Smokeless tobacco: Never   Tobacco comments:    0.5 pack cigarettes  per month per patient   Vaping Use   Vaping Use: Never used  Substance and Sexual Activity   Alcohol use: No    Alcohol/week: 0.0 standard drinks of alcohol   Drug use: Never   Sexual activity: Yes    Partners: Male    Comment: married  Other Topics Concern   Not on file  Social History Narrative   Regular exercise:  3 days weekly   Caffeine Use:  1 soda daily   One child biological daughter born in 59 and an adopted niece.   [place] specialist   Married- may be divorcing.          Social Determinants of Health   Financial Resource Strain: Not on file  Food Insecurity: Not on file  Transportation Needs: Not on file  Physical Activity: Not on file  Stress: Not on file  Social Connections: Not on file  Intimate Partner Violence: Not on file    Allergies  Allergen Reactions   Metformin And Related Nausea And Vomiting    Current Outpatient Medications  Medication Sig Dispense Refill   ACCU-CHEK GUIDE test strip USE UP TO FOUR TIMES DAILY AS DIRECTED 400 strip 6   Accu-Chek Softclix Lancets lancets USE UP TO FOUR TIMES DAILY AS DIRECTED 100  each 12   ALPRAZolam (XANAX) 0.5 MG tablet Take 1 tablet (0.5 mg total) by mouth 3 (three) times daily as needed for anxiety. 90 tablet 1   aspirin 81 MG EC tablet Take 1 tablet (81 mg total) by mouth daily. Swallow whole. 30 tablet 12   atorvastatin (LIPITOR) 80 MG tablet TAKE 1 TABLET BY MOUTH DAILY 90 tablet 3   BD PEN NEEDLE NANO U/F 32G X 4 MM MISC USE AS DIRECTED 300 each 1   blood glucose meter kit and supplies KIT Dispense based on patient and insurance preference. Use up to four times daily as directed. (FOR ICD-9 250.00, 250.01). 1 each 0   Cholecalciferol (VITAMIN D3) 75 MCG (3000 UT) TABS Take 1 tablet by mouth daily at 6 (six) AM. 30 tablet  cilostazol (PLETAL) 100 MG tablet Take 1 tablet (100 mg total) by mouth 2 (two) times daily before a meal. 60 tablet 11   cloNIDine (CATAPRES) 0.1 MG tablet 1 in the AM and 3 PM and 1 extra daily as needed for anxiety 270 tablet 0   clopidogrel (PLAVIX) 75 MG tablet TAKE 1 TABLET BY MOUTH EVERY DAY WITH BREAKFAST 90 tablet 3   Continuous Blood Gluc Receiver (DEXCOM G6 RECEIVER) DEVI Use as directed 1 each 1   Continuous Blood Gluc Sensor (DEXCOM G6 SENSOR) MISC Use as directed 1 each 1   Continuous Blood Gluc Transmit (DEXCOM G6 TRANSMITTER) MISC Use as directed 1 each 1   divalproex (DEPAKOTE ER) 500 MG 24 hr tablet TAKE 3 TABLETS (1,500 MG TOTAL) BY MOUTH AT BEDTIME. 270 tablet 1   FARXIGA 10 MG TABS tablet TAKE 1 TABLET BY MOUTH DAILY BEFORE BREAKFAST 30 tablet 11   Glycerin-Hypromellose-PEG 400 (VISINE DRY EYE OP) Place 1 drop into both eyes 3 (three) times daily as needed (dry eyes).     insulin degludec (TRESIBA FLEXTOUCH) 100 UNIT/ML FlexTouch Pen Inject 12 Units into the skin daily. 3 mL 5   isosorbide mononitrate (IMDUR) 120 MG 24 hr tablet TAKE 1 TABLET BY MOUTH EVERY DAY 90 tablet 3   JANUVIA 50 MG tablet TAKE 1 TABLET BY MOUTH EVERY DAY 90 tablet 1   lisinopril (ZESTRIL) 5 MG tablet Take 1 tablet (5 mg total) by mouth daily. 90  tablet 1   metoprolol succinate (TOPROL-XL) 25 MG 24 hr tablet TAKE 1 TABLET BY MOUTH EVERY DAY *KEEP APPOINTMENT FOR REFILLS* 90 tablet 3   mupirocin ointment (BACTROBAN) 2 % Apply 1 application. topically 2 (two) times daily. 30 g 2   nitroGLYCERIN (NITROSTAT) 0.4 MG SL tablet PLACE 1 TABLET UNDER THE TONGUE EVERY 5 MINUTES AS NEEDED FOR CHEST PAIN. MAX 3. CALL 911 25 tablet 3   OLANZapine (ZYPREXA) 20 MG tablet Take 1 tablet (20 mg total) by mouth at bedtime. 90 tablet 0   PARoxetine (PAXIL) 20 MG tablet Take 3 tablets (60 mg total) by mouth daily. As of 04/08/22 270 tablet 0   ranolazine (RANEXA) 500 MG 12 hr tablet Take 1 tablet (500 mg total) by mouth 2 (two) times daily. 180 tablet 3   XIIDRA 5 % SOLN Place 1 drop into both eyes in the morning and at bedtime.     No current facility-administered medications for this visit.      PHYSICAL EXAM Vitals:   08/10/22 1537  BP: (!) 89/57  Pulse: 66  Resp: 20  Temp: 98.3 F (36.8 C)  SpO2: 94%  Weight: 130 lb (59 kg)  Height: 5\' 4"  (1.626 m)    Constitutional: well appearing. no distress. Appears well nourished.  Neurologic: CN intact. no focal findings. no sensory loss. Psychiatric:  Mood and affect symmetric and appropriate. Eyes:  No icterus. No conjunctival pallor. Ears, nose, throat:  mucous membranes moist. Midline trachea.  Cardiac: regular rate and rhythm.  Respiratory:  unlabored. Abdominal:  soft, non-tender, non-distended.  Peripheral vascular: no palpable pedal pulses. Feet are warm. Extremity: no edema. no cyanosis. no pallor.  Skin: no gangrene. no ulceration.  Lymphatic: no Stemmer's sign. no palpable lymphadenopathy.  PERTINENT LABORATORY AND RADIOLOGIC DATA  Most recent CBC    Latest Ref Rng & Units 02/25/2022    9:50 AM 05/07/2021    1:49 AM 05/06/2021    3:51 AM  CBC  WBC 4.0 - 10.5 K/uL  6.1  9.7  10.3   Hemoglobin 12.0 - 15.0 g/dL 12.3  12.8  12.5   Hematocrit 36.0 - 46.0 % 36.5  37.3  36.4   Platelets  150.0 - 400.0 K/uL 187.0  199  180      Most recent CMP    Latest Ref Rng & Units 07/06/2022    1:28 PM 03/29/2022    3:04 PM 02/25/2022    9:50 AM  CMP  Glucose 70 - 99 mg/dL 51   89   BUN 6 - 23 mg/dL 20   18   Creatinine 0.40 - 1.20 mg/dL 1.13   1.17   Sodium 135 - 145 mEq/L 138   138   Potassium 3.5 - 5.1 mEq/L 4.5   4.4   Chloride 96 - 112 mEq/L 103   106   CO2 19 - 32 mEq/L 24   26   Calcium 8.4 - 10.5 mg/dL 9.6   9.6   Total Protein 6.0 - 8.3 g/dL 7.0  6.6  6.8   Total Bilirubin 0.2 - 1.2 mg/dL 0.6  0.4  0.6   Alkaline Phos 39 - 117 U/L 123  152  128   AST 0 - 37 U/L 18  14  17    ALT 0 - 35 U/L 15  9  13      Renal function CrCl cannot be calculated (Patient's most recent lab result is older than the maximum 21 days allowed.).  Hgb A1c MFr Bld (%)  Date Value  07/06/2022 6.7 (H)    LDL Chol Calc (NIH)  Date Value Ref Range Status  07/17/2020 65 0 - 99 mg/dL Final   LDL Cholesterol  Date Value Ref Range Status  11/16/2021 77 0 - 99 mg/dL Final   Direct LDL  Date Value Ref Range Status  07/18/2008 145.2 mg/dL Final    Comment:    See lab report for associated comment(s)        +-------+-----------+-----------+------------+------------+  ABI/TBIToday's ABIToday's TBIPrevious ABIPrevious TBI  +-------+-----------+-----------+------------+------------+  Right 0.42       0          0.49        0             +-------+-----------+-----------+------------+------------+  Left  0.48       0.22       0.54        0             +-------+-----------+-----------+------------+------------+    Bailey Greene. Stanford Breed, MD [facility] Phone Number: [phone] 08/13/2022 8:54 AM  Total time spent on preparing this encounter including chart review, data review, collecting history, examining the patient, coordinating care for this established patient, 30 minutes  Portions of this report may have been transcribed using  voice recognition software.  Every effort has been made to ensure accuracy; however, inadvertent computerized transcription errors may still be present.

## 2022-08-23 ENCOUNTER — Other Ambulatory Visit: Payer: Self-pay | Admitting: Family

## 2022-08-31 ENCOUNTER — Ambulatory Visit (INDEPENDENT_AMBULATORY_CARE_PROVIDER_SITE_OTHER): Payer: 59 | Admitting: Psychiatry

## 2022-08-31 ENCOUNTER — Encounter: Payer: Self-pay | Admitting: Psychiatry

## 2022-08-31 DIAGNOSIS — F411 Generalized anxiety disorder: Secondary | ICD-10-CM | POA: Diagnosis not present

## 2022-08-31 DIAGNOSIS — F4001 Agoraphobia with panic disorder: Secondary | ICD-10-CM

## 2022-08-31 DIAGNOSIS — F3162 Bipolar disorder, current episode mixed, moderate: Secondary | ICD-10-CM | POA: Diagnosis not present

## 2022-08-31 MED ORDER — OLANZAPINE 20 MG PO TABS: 20.0000 mg | ORAL_TABLET | Freq: Every day | ORAL | 0 refills | Status: DC

## 2022-08-31 MED ORDER — CLONIDINE HCL 0.1 MG PO TABS: ORAL_TABLET | ORAL | 0 refills | Status: DC

## 2022-08-31 MED ORDER — PAROXETINE HCL 20 MG PO TABS: 60.0000 mg | ORAL_TABLET | Freq: Every day | ORAL | 0 refills | Status: DC

## 2022-08-31 NOTE — Progress Notes (Signed)
Bailey Greene [id number] 1960/07/08 62 y.o.   Subjective:   Patient ID:  Bailey Greene is a 62 y.o. (DOB [birth year]) female.  Chief Complaint:  No chief complaint on file.   Anxiety Symptoms include nervous/anxious behavior. Patient reports no decreased concentration, dizziness, nausea or palpitations.     Bailey Greene presents for follow-up of bipolar disorder and panic attacks and general anxiety.  visit 5/22,2020.  Increased Depakote then to 1500 mg daily.  Boss rec LOA for 4 weeks.  Going through separation is really tough.  Panic attacks at work interfering.  Had this job 10 years.  Likes the job but hard to concentrate and stay focused.  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  Panic incr pulse and SOB, fear, sweating and shakey.  Panic lasts 20 mins and increased frequency.  Several in a day.  Going on for 2 mos but getting worse.  Boss can tell from listening to her calls and drop in production.  At follow up visit November 22, 2018.  Mood swings are better.  Trouble staying asleep.  Caffeine 1 coffee and 1 soda daily.She was still having severe anxiety plus panic.  We discussed the risk of SSRIs trickling triggering mood swings but the severity of the anxiety was such that we decided to initiate fluoxetine 10 mg daily to increase to 20 mg daily.  We also were using low-dose mirtazapine to help with sleep.  seen January 18, 2019.  She was granted medical leave for panic symptoms.  She was switched to paroxetine for panic from fluoxetine.  Since she was switched from mirtazapine to quetiapine 25 to 50 mg nightly for insomnia.   November 2020 visit with the following noted: It is helping some with anxiety but a lot of stress.  No unusual mood swings without a change.   She's satisfied with the 20 mg paroxetine. Sleep is much better with quetiapine and xanax at night.  5 hour and sometimes better depending on work schedule.   No meds were changed.    10/23/2019 visit with  the following noted: Anxiety, dep, panic attacks all worse lately for 2 mos.  Anger also worse mostly just at home bc manages it at work.  Several triggers.  Sister passed March 26 after illness.  Work and daughter are stressful.  Overwhelming. Not as good staying asleep.  Random panic.  Feels SOB.  Wanting to isolate. Spontaneous crying spell.s Poor concentration affecting work.    Plan: Increase paroxetine to 1-1/2 of the 20 mg tablets daily For sleep increase quetiapine to 2 the 25 mg tablets nightly  03/05/20 appt with following noted: Less anxious and sleeping is better.  Panic can be triggered at work with SOB and heart racing.  Happens about 2 times weekly. Main stress is work is overwhelming.  Talk with customers all day.   No clear mania.   Still some depression too. No SE Plan: no med changes except increase paroxetine to 40 for anxiety  09/02/2020 appt noted: Dx DM since here. Anxiety and panic worse since January.  Consistent with meds.  Stress dx DM.  Sister passed away.  Panic with SOB and occ sweats.  Panic daily. Usually before noon usually with trigger. Xanax makes her relax. 1 cup coffee AM.   Sleep 4-5 hours with quetiapine 25 mg.   And pretty consistent. Mood feels labile.  About to lose it including irritable and angry.   Plan: Increase Quetiapine 25 mg 2 mg HS.   12/10/20  appt noted: Dizziniess for awhile after paroxetine resolved. Still having anxiety and panic but not daily.   Average 7/10 anxiety usually triggered with work as primary stress.  She and H still dealing with problems.  Panic worse either in the AM or evening. Sleep is better 4-5 hours nightly. Taking quetiapine 50 mg with Xanax at night. Still on Depakote ER 1500, busipirone 30 BID , paroxetine 40 mg daily No anger problems at work.  Appetite is better.  Regaining some weight. Evening walks helps mental health. Plan: Start olanzapine 5 mg daily and if that is not sufficient within a week increase to  10 mg nightly.  We can go higher if needed and call if necessary. BC failure of alternatives, alprazolam 0.5 mg AM before work.   02/18/2021 appointment with the following noted: Anx down to 4-5/10.  Notices calmer with Xanax and throughout the day.Less anxiety and panic at work but getting better. Depression about the same 6/10.  Tries to distract herself from problems at home.. Sleep 6 hours now with olanzapine.   No SE Plan: increase olanzapine 15 mg HS. BC failure of alternatives, alprazolam 0.5 mg AM before work.   paroxetine to 40 mg at night.  04/28/2021 appointment with the following noted: Increase olanzapine 15 mg HS helped awhile with initial dizziness resolved.  Not drowsy. Still better than it was but holidays are hard.  Better than in a long time overall.  H saw a difference also.   Sleep 5-6 hours.  Never been a 7 hour sleeper.   Eating is normal now. Depression still there but better also 3/10.  Can enjoy some things. Better interest. Most stressful thing about work is the work load and talking with customers who  are difficult.  Had this job 13 years. Plan: Increase olanzapine 20 mg 3 hours before HS.  06/10/2021 phone call complaining of persistent insomnia.  She was allowed to increase alprazolam to 0.5 mg every morning and 1 mg nightly  06/30/2021 appointment with the following noted: Taking alprazolam 0.5 mg 2 daily usually. Increased olanzapine to 20 mg daily. CABG 2006.  Valve problem with CP and hospitalized.  Change in med. Going better now. No problems with meds.   About 6 hour sleep and in bed earlier.  Depression is OK overall but residual anxiety and crying.  Anxiety around health now too and work. Panic is not gone but better on Paroxetine 20 Sometimes brief irritab ility situationally. Plan: No med changes.  Continue olanzapine 20 mg, paroxetine 40 mg, alprazolam 0.5 mg twice daily as needed  11/18/2021 appointment with the following noted: At one point  since here anxiety got a lot worse but getting better now.  Some panic attacks including Saturday.   Compliant.  More work stress trying to get caught up.   Only taking alprazolam 0.5 mg HS and not in daytime.  Not sleepy with it daytime. No SE No depression or anxiety.   Plan: no med changes  04/08/22 TC:  increased anxiety and panic wanting med changes, so increase paroxetine to 60 mg daily  04/14/22 appt noted: Current meds alprazolam 0.5 mg 3 times daily as needed anxiety, buspirone 30 mg twice daily, Depakote ER 1500 mg nightly,, Olanzapine 20 mg nightly, quetiapine 25 mg nightly for sleep, paroxetine 60 mg as of 04/08/22 Anxiety and panic worse for a month and missed some work and then taken out of work by PCP 03/30/22. Sx including panic with SOB. At least 2-3 times  per day.  Worrying about everything she has to do including doctor's appts needs eye surgery.  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 Having to talk with customers all day triggers panic and can't talk during panic attacks. Even outside of work trouble getting things done Dt low energy and anxiety.  Everything closing in at one tgime and trouble breathing.  Feel overwhelmed all the time.   Also depression and has to push herself to go out of the house and low energy. Sleep is about 5-6 hours per night.   Wants to stay out of work until 12/22 to give time for doctor appts and get herself together. Current job 13 years. No med changes  05/17/22 appt noted: Anxiety is awful right now.  Hard to breath.  Heart races at times with panic.  Mind races.   2-3 panic attacks daily. SE maybe some sleepiness. Sleep about 4-5 hours.  Can't calm her mind down.   No recent counselors but has appt on 12/27 pending. Still out of work.  [place] asking about the [place] plan  Plan: DC buspirone Off label clonidine and increase to 0.1 mg BID for severe anxiety and panic   06/29/2022 appointment noted: Current psychiatric medication  clonidine 0.2 mg daily, Depakote ER 1500 nightly, alprazolam 0.5 mg twice daily as needed anxiety, olanzapine 20 mg nightly, paroxetine 60 mg daily, quetiapine 25 mg nightly Clonidine is the first time that seen a change with anxiety.  It immediately helps.  Checking BP and all systolic pressures above [id number]. Better response with BID dosing.  Less SOB.  Less irritable.   SE dry mouth, no others. Sleep is better  but still only 4-5 hours.   Depression is gradually getting better but not there yet. Back at work and clonidine helps her deal with the pressure better there.  Handling it better.  Work function is pretty good with constant volume.  Before was SOB bc couldn't relax to breathe. 2 therapy appts. Plan: Consider further increase olanzapine if necessary Continue olanzapine 20 mg 3 hours before HS. Continue Depakote ER 1500 mg HS Off label clonidine 0.1 mg BID for severe anxiety and panic has helped reduce anxiety by over 50% She didn't each or drink much today and BP borderline wihtout  SX.  Push fluids. Increased paroxetine to 60 mg daily as of 04/08/22 for worsening panic. No better so far with increased dose. BC failure of alternatives, alprazolam 0.5 mg AM before work twice daily.   08/31/22 appt noted: Stopped Depakote last vist and only alprazolam 0.5 mg HS now. MedS: In a much better place .  Still anxious but not like it used to be.  Couldn't control it before.  Less SOB. Still some panic but not as easy.  Still get triggered at work or home that realizes she should not get as upset over.  Easily overwhelmed with high demands. Sleep is better about 5-6 hours. Was having NM but less now.  Was waking H with them but not now. No SE noted.   BP usally above 100/70.  Not lightheaded or dizzy with standing.   PDMP only shows Xanax.  She denies abusing substances  Past Psychiatric Medication Trials: Hydroxyzine, mirtazapine poor response for sleep,  Depakote 1500,  Xanax, buspirone,   quetiapine low-dose for sleep Olanzapine 20 duloxetine, citalopram, Wellbutrin, fluoxetine, paroxetine 60 Clonidine 0.1 BID Buspirone NR  Notes and chart were reviewed with the patient regarding prior history and symptoms.  Review of Systems:  Review of Systems  Cardiovascular:  Negative for palpitations.  Gastrointestinal:  Negative for nausea.  Neurological:  Negative for dizziness, tremors and light-headedness.  Psychiatric/Behavioral:  Positive for dysphoric mood. Negative for decreased concentration and sleep disturbance. The patient is nervous/anxious.     Medications: I have reviewed the patient's current medications.  Current Outpatient Medications  Medication Sig Dispense Refill   ACCU-CHEK GUIDE test strip USE UP TO FOUR TIMES DAILY AS DIRECTED 400 strip 6   Accu-Chek Softclix Lancets lancets USE UP TO FOUR TIMES DAILY AS DIRECTED 100 each 12   ALPRAZolam (XANAX) 0.5 MG tablet Take 1 tablet (0.5 mg total) by mouth 3 (three) times daily as needed for anxiety. 90 tablet 1   aspirin 81 MG EC tablet Take 1 tablet (81 mg total) by mouth daily. Swallow whole. 30 tablet 12   atorvastatin (LIPITOR) 80 MG tablet TAKE 1 TABLET BY MOUTH DAILY 90 tablet 3   BD PEN NEEDLE NANO U/F 32G X 4 MM MISC USE AS DIRECTED 300 each 1   blood glucose meter kit and supplies KIT Dispense based on patient and insurance preference. Use up to four times daily as directed. (FOR ICD-9 250.00, 250.01). 1 each 0   Cholecalciferol (VITAMIN D3) 75 MCG (3000 UT) TABS Take 1 tablet by mouth daily at 6 (six) AM. 30 tablet    cilostazol (PLETAL) 100 MG tablet Take 1 tablet (100 mg total) by mouth 2 (two) times daily before a meal. 60 tablet 11   cloNIDine (CATAPRES) 0.1 MG tablet 1 in the AM and 3 PM and 1 extra daily as needed for anxiety 270 tablet 0   clopidogrel (PLAVIX) 75 MG tablet TAKE 1 TABLET BY MOUTH EVERY DAY WITH BREAKFAST 90 tablet 3   Continuous Blood Gluc Receiver (DEXCOM G6 RECEIVER) DEVI Use as  directed 1 each 1   Continuous Blood Gluc Sensor (DEXCOM G6 SENSOR) MISC Use as directed 1 each 1   Continuous Blood Gluc Transmit (DEXCOM G6 TRANSMITTER) MISC Use as directed 1 each 1   divalproex (DEPAKOTE ER) 500 MG 24 hr tablet TAKE 3 TABLETS (1,500 MG TOTAL) BY MOUTH AT BEDTIME. 270 tablet 1   FARXIGA 10 MG TABS tablet TAKE 1 TABLET BY MOUTH DAILY BEFORE BREAKFAST 30 tablet 11   Glycerin-Hypromellose-PEG 400 (VISINE DRY EYE OP) Place 1 drop into both eyes 3 (three) times daily as needed (dry eyes).     insulin degludec (TRESIBA FLEXTOUCH) 100 UNIT/ML FlexTouch Pen Inject 12 Units into the skin daily. 3 mL 5   isosorbide mononitrate (IMDUR) 120 MG 24 hr tablet TAKE 1 TABLET BY MOUTH EVERY DAY 90 tablet 3   JANUVIA 50 MG tablet TAKE 1 TABLET BY MOUTH EVERY DAY 90 tablet 1   lisinopril (ZESTRIL) 5 MG tablet Take 1 tablet (5 mg total) by mouth daily. 90 tablet 1   metoprolol succinate (TOPROL-XL) 25 MG 24 hr tablet TAKE 1 TABLET BY MOUTH EVERY DAY *KEEP APPOINTMENT FOR REFILLS* 90 tablet 3   mupirocin ointment (BACTROBAN) 2 % Apply 1 application. topically 2 (two) times daily. 30 g 2   nitroGLYCERIN (NITROSTAT) 0.4 MG SL tablet PLACE 1 TABLET UNDER THE TONGUE EVERY 5 MINUTES AS NEEDED FOR CHEST PAIN. MAX 3. CALL 911 25 tablet 3   OLANZapine (ZYPREXA) 20 MG tablet Take 1 tablet (20 mg total) by mouth at bedtime. 90 tablet 0   PARoxetine (PAXIL) 20 MG tablet Take 3 tablets (60 mg total) by mouth daily. As of 04/08/22  270 tablet 0   ranolazine (RANEXA) 500 MG 12 hr tablet Take 1 tablet (500 mg total) by mouth 2 (two) times daily. 180 tablet 3   XIIDRA 5 % SOLN Place 1 drop into both eyes in the morning and at bedtime.     No current facility-administered medications for this visit.    Medication Side Effects: ? 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   Qualifier: Diagnosis of  By: Garen Grams     Diabetes mellitus type 2 in nonobese ([place]) 07/2016   Dyslipidemia    Headache(784.0)    History of pulmonary embolism 07/08/2020   HTN (hypertension)    Migraine    NSTEMI (non-ST elevated myocardial infarction) ([place])    PAD (peripheral artery disease) ([place])    Pulmonary embolus ([facility])    unprovoked    Family History  Problem Relation Age of Onset   Lupus Mother    Heart attack Father    Diabetes Father    Hypertension Father    Diabetes Paternal Grandmother    Hypertension Paternal Grandmother    Stroke Neg Hx    Kidney disease Neg Hx    Hyperlipidemia Neg Hx    Sudden death Neg Hx     Social History   Socioeconomic History   Marital status: Legally Separated    Spouse name: Not on file   Number of children: Not on file   Years of education: Not on file   Highest education level: Not on file  Occupational History   Occupation: mortgage loan specialist  Tobacco Use   Smoking status: Some Days    Years: 25    Types: Cigarettes   Smokeless tobacco: Never   Tobacco comments:    0.5 pack cigarettes  per month per patient   Vaping Use   Vaping Use: Never used  Substance and Sexual Activity   Alcohol use: No    Alcohol/week: 0.0 standard drinks of alcohol   Drug use: Never   Sexual activity: Yes    Partners: Male    Comment: married  Other Topics Concern   Not on file  Social History Narrative   Regular exercise:  3 days weekly   Caffeine Use:  1 soda daily   One child biological daughter born in 76 and an adopted niece.   [place] specialist   Married- may be divorcing.          Social Determinants of Health   Financial Resource Strain: Not on file  Food Insecurity: Not on file  Transportation Needs: Not on file  Physical Activity:  Not on file  Stress: Not on file  Social Connections: Not on file  Intimate Partner Violence: Not on file    Past Medical History, Surgical history, Social history, and Family history were reviewed and updated as appropriate.   Please see review of systems for further details on the patient's review from today.   Objective:   Physical Exam:  LMP 11/28/2009   Physical Exam Constitutional:      General: She is not in acute distress.    Appearance: She is well-developed.  Musculoskeletal:  General: No deformity.  Neurological:     Mental Status: She is alert and oriented to person, place, and time.     Coordination: Coordination normal.  Psychiatric:        Attention and Perception: She is attentive. She does not perceive auditory hallucinations.        Mood and Affect: Mood is anxious and depressed. Affect is not labile, blunt, angry or tearful.        Speech: Speech normal. Speech is not rapid and pressured.        Behavior: Behavior normal. Behavior is not agitated.        Thought Content: Thought content normal. Thought content is not paranoid or delusional. Thought content does not include homicidal or suicidal ideation.        Cognition and Memory: Cognition normal.        Judgment: Judgment normal.     Comments: Insight intact. No auditory or visual hallucinations. No delusions.       Lab Review:     Component Value Date/Time   NA 138 07/06/2022 1328   NA 141 07/23/2020 1300   K 4.5 07/06/2022 1328   CL 103 07/06/2022 1328   CO2 24 07/06/2022 1328   GLUCOSE 51 (L) 07/06/2022 1328   BUN 20 07/06/2022 1328   BUN 7 07/23/2020 1300   CREATININE 1.13 07/06/2022 1328   CREATININE 0.72 06/22/2012 0840   CALCIUM 9.6 07/06/2022 1328   PROT 7.0 07/06/2022 1328   PROT 6.6 03/29/2022 1504   ALBUMIN 4.7 07/06/2022 1328   ALBUMIN 4.6 03/29/2022 1504   AST 18 07/06/2022 1328   ALT 15 07/06/2022 1328   ALKPHOS 123 (H) 07/06/2022 1328   BILITOT 0.6 07/06/2022 1328    BILITOT 0.4 03/29/2022 1504   GFRNONAA >60 05/06/2021 0351   GFRNONAA >89 06/22/2012 0840   GFRAA 102 07/23/2020 1300   GFRAA >89 06/22/2012 0840       Component Value Date/Time   WBC 6.1 02/25/2022 0950   RBC 3.99 02/25/2022 0950   HGB 12.3 02/25/2022 0950   HCT 36.5 02/25/2022 0950   PLT 187.0 02/25/2022 0950   MCV 91.4 02/25/2022 0950   MCH 29.6 05/07/2021 0149   MCHC 33.8 02/25/2022 0950   RDW 14.4 02/25/2022 0950   LYMPHSABS 2.2 02/25/2022 0950   MONOABS 0.4 02/25/2022 0950   EOSABS 0.3 02/25/2022 0950   BASOSABS 0.0 02/25/2022 0950    No results found for: "POCLITH", "LITHIUM"   No results found for: "PHENYTOIN", "PHENOBARB", "VALPROATE", "CBMZ"   .res Assessment: Plan:    There are no diagnoses linked to this encounter. Chronic work stress  Greater than 50% of 30 min face to face time with patient was spent on counseling and coordination of care. We discussed the following: she and her husband have both noted significant improvement and her level of anxiety and she is less depressed with the addition of olanzapine and the increased to 20 mg daily.  It is also a mood stabilizer.  It also can help with depression when added to an SSRI.  We discussed side effects in detail.  This is the usual max dose.    MUCH BETTER THAN I WAS  Continue olanzapine 20 mg 3 hours before HS. Stoppped Depakote ER without difficulty  Off label clonidine 0.1 mg BID for severe anxiety and panic has helped reduce anxiety by over 50% She didn't each or drink much today and BP borderline wihtout  SX.  Push fluids.  paroxetine to 60 mg daily as of 04/08/22 for worsening panic helped No better so far with increased dose.  BC failure of alternatives, alprazolam 0.5 mg prn.  Alternatives clonazepam, Ativan  Discussed potential metabolic side effects associated with atypical antipsychotics, as well as potential risk for movement side effects. Advised pt to contact office if movement side  effects occur.  Metabolic side effects are unlikely at this low dose of quetiapine and almost no risk of EPS at this low-dose. DM managed  We discussed the short-term risks associated with benzodiazepines including sedation and increased fall risk among others.  Discussed long-term side effect risk including dependence, potential withdrawal symptoms, and the potential eventual dose-related risk of dementia.  But recent studies from 2020 dispute this association between benzodiazepines and dementia risk. Newer studies in 2020 do not support an association with dementia. If too sleepy with it call and will call if needed to switch to lorazepam.  continue counseling refer to [facility]  Has RTW and doing ok right now  This appt was 30 mins.   No med changes indicated  Follow-up 3 mos  Lynder Parents MD, DFAPA  Please see After Visit Summary for patient specific instructions.   Future Appointments  Date Time Provider [place]  10/05/2022  2:20 PM [name], NP [facility]  11/09/2022  1:00 PM [facility]  11/09/2022  1:40 PM Cherre Robins, MD [facility]    No orders of the defined types were placed in this encounter.      -------------------------------

## 2022-09-06 ENCOUNTER — Other Ambulatory Visit: Payer: Self-pay | Admitting: Psychiatry

## 2022-09-06 DIAGNOSIS — F4001 Agoraphobia with panic disorder: Secondary | ICD-10-CM

## 2022-09-19 ENCOUNTER — Other Ambulatory Visit: Payer: Self-pay | Admitting: Family

## 2022-09-19 DIAGNOSIS — E119 Type 2 diabetes mellitus without complications: Secondary | ICD-10-CM

## 2022-09-20 ENCOUNTER — Other Ambulatory Visit: Payer: Self-pay | Admitting: Family

## 2022-09-20 DIAGNOSIS — E119 Type 2 diabetes mellitus without complications: Secondary | ICD-10-CM

## 2022-09-20 MED ORDER — DEXCOM G6 RECEIVER DEVI: 0 refills | Status: AC

## 2022-09-22 ENCOUNTER — Telehealth: Payer: Self-pay

## 2022-09-22 NOTE — Telephone Encounter (Signed)
Pt called stating she is having in worsening pains in BLE.She describes her pains to be tightness, Constant, achy pains that are happening while resting and when walking.Patient also reports her feet are cool but don't stay cold.She is rating her pain levels 7/10. She is requesting a sooner appt because Dr. [name] advised she may qualify for a surgical procedure. Appt moved to sooner date per pt request and worsening sxs.

## 2022-09-23 ENCOUNTER — Ambulatory Visit (HOSPITAL_COMMUNITY)
Admission: RE | Admit: 2022-09-23 | Discharge: 2022-09-23 | Disposition: A | Discharge status: Home / Self Care | Payer: 59 | Source: Ambulatory Visit | Attending: Cardiology | Admitting: Cardiology

## 2022-09-23 DIAGNOSIS — I739 Peripheral vascular disease, unspecified: Secondary | ICD-10-CM | POA: Diagnosis present

## 2022-09-23 LAB — VAS US ABI WITH/WO TBI
Left ABI: 0.58
Right ABI: 0.46

## 2022-09-24 ENCOUNTER — Other Ambulatory Visit: Payer: Self-pay | Admitting: Cardiology

## 2022-09-26 NOTE — Progress Notes (Unsigned)
[facility] [place]  ASSESSMENT / PLAN: Bailey Greene is a 62 y.o. 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 Blood glucose control with goal A1c < 7%. Blood pressure control with goal blood pressure < 140/90 mmHg. Lipid reduction therapy with goal LDL-C <100 mg/dL (<16 if symptomatic from PAD).  Aspirin 81mg  PO QD.  Atorvastatin 40-80mg  PO QD (or other "high intensity" statin therapy). Daily walking to and past the point of discomfort. Patient counseled to keep a log of exercise distance.  I am suspicious she has spinal stenosis and claudication type symptoms. She has no palpable femoral pulses. Will check a CT angiogram of abdomen and pelvis with runoff. I will call her with the results.  CHIEF COMPLAINT: Peripheral arterial disease demonstrated on recent angiogram.  HISTORY OF PRESENT ILLNESS: Bailey Greene is a 62 y.o. female for to clinic after recent cardiac catheterization demonstrated calcified common femoral artery and occluded right superficial femoral artery.  The patient is relatively asymptomatic from a peripheral arterial disease standpoint.  She reports some discomfort and a sensation of "heaviness" in her calves after walking for more than 30 minutes.  She is able to do all of her independent activities of daily living without difficulty.  She can shop through a grocery store without discomfort in her calves.  She does not report symptoms of rest pain.  She does not have any ischemic ulceration about her feet.  04/06/22: She returns for surveillance.  She thinks her symptoms are getting a bit worse.  She continues to smoke sporadically.  She has no ischemic rest pain symptoms.  She has no ulcers about her  feet.  08/10/22: returns to clinic. Symptoms are about the same. Reduced cigarette use - <1cig/day. No rest pain. No ulcers.  09/28/22: patient returns to clinic. She has stopped smoking. She feels she is deteriorating. She reports both cramping discomfort in the calves with walking. She also describes radiating discomfort in her legs from sitting from a long period of time. She denies back pain. She feels the radiating pain is more bothersome to her now.   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   Qualifier: Diagnosis of  By: Cheri Guppy     Diabetes mellitus type 2 in nonobese (HCC) 07/2016   Dyslipidemia    Headache(784.0)    History of pulmonary embolism 07/08/2020   HTN (hypertension)    Migraine    NSTEMI (non-ST elevated myocardial infarction) (HCC)    PAD (peripheral artery disease) (HCC)    Pulmonary embolus (HCC)    unprovoked    Past Surgical History:  Procedure Laterality Date   BUNIONECTOMY  08/2011   right foot   CARDIAC CATHETERIZATION  2007   severe native 3 v dz, all grafts patent (LIMA-LAD, RIMA-Diag, SVG-OM1-OM2, R radial-PDA)   CESAREAN SECTION  1984   CORONARY ARTERY BYPASS GRAFT  2006    Coronary artery bypass grafting x5 with a left  internal  mammary to the left anterior descending coronary artery.  Free right  internal mammary to the diagonal coronary artery, sequential reverse  saphenous vein graft to  the first and second obtuse marginal, right  artery bypass to the posterior descending coronary artery with endo-vein harvesting.   LEFT HEART CATH AND CORS/GRAFTS ANGIOGRAPHY N/A 05/05/2021   Procedure: LEFT HEART CATH AND CORS/GRAFTS ANGIOGRAPHY;  Surgeon: Marykay Lex, MD;  Location: [facility];  Service: Cardiovascular;  Laterality: N/A;   UMBILICAL HERNIA REPAIR N/A  01/16/2021   Procedure: UMBILICAL  HERNIA REPAIR;  Surgeon: Fritzi Mandes, MD;  Location: [facility] OR;  Service: General;  Laterality: N/A;    Family History  Problem Relation Age of Onset   Lupus Mother    Heart attack Father    Diabetes Father    Hypertension Father    Diabetes Paternal Grandmother    Hypertension Paternal Grandmother    Stroke Neg Hx    Kidney disease Neg Hx    Hyperlipidemia Neg Hx    Sudden death Neg Hx     Social History   Socioeconomic History   Marital status: Legally Separated    Spouse name: Not on file   Number of children: Not on file   Years of education: Not on file   Highest education level: Not on file  Occupational History   Occupation: mortgage loan specialist  Tobacco Use   Smoking status: Some Days    Years: 25    Types: Cigarettes   Smokeless tobacco: Never   Tobacco comments:    0.5 pack cigarettes  per month per patient   Vaping Use   Vaping Use: Never used  Substance and Sexual Activity   Alcohol use: No    Alcohol/week: 0.0 standard drinks of alcohol   Drug use: Never   Sexual activity: Yes    Partners: Male    Comment: married  Other Topics Concern   Not on file  Social History Narrative   Regular exercise:  3 days weekly   Caffeine Use:  1 soda daily   One child biological daughter born in 39 and an adopted niece.   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          Social Determinants of Health   Financial Resource Strain: Not on file  Food Insecurity: Not on file  Transportation Needs: Not on file  Physical Activity: Not on file  Stress: Not on file  Social Connections: Not on file  Intimate Partner Violence: Not on file    Allergies  Allergen Reactions   Metformin And Related Nausea And Vomiting    Current Outpatient Medications  Medication Sig Dispense Refill   ACCU-CHEK GUIDE test strip USE UP TO FOUR TIMES DAILY AS DIRECTED 400 strip 6   Accu-Chek Softclix Lancets lancets  USE UP TO FOUR TIMES DAILY AS DIRECTED 100 each 12   ALPRAZolam (XANAX) 0.5 MG tablet TAKE 1 TABLET (0.5 MG TOTAL) BY MOUTH 3 (THREE) TIMES DAILY AS NEEDED FOR ANXIETY. 90 tablet 1   aspirin 81 MG EC tablet Take 1 tablet (81 mg total) by mouth daily. Swallow whole. 30 tablet 12   atorvastatin (LIPITOR) 80 MG tablet TAKE 1 TABLET BY MOUTH DAILY 90 tablet 3   BD PEN NEEDLE NANO U/F 32G X 4 MM MISC USE AS DIRECTED 300 each 1   blood glucose meter kit and supplies KIT Dispense based on patient and insurance preference. Use up to four times daily as directed. (FOR ICD-9 250.00, 250.01). 1 each 0   Cholecalciferol (VITAMIN D3) 75 MCG (3000 UT) TABS Take 1 tablet by  mouth daily at 6 (six) AM. 30 tablet    cilostazol (PLETAL) 100 MG tablet Take 1 tablet (100 mg total) by mouth 2 (two) times daily before a meal. 60 tablet 11   cloNIDine (CATAPRES) 0.1 MG tablet 1 in the AM and 3 PM and 1 extra daily as needed for anxiety 270 tablet 0   clopidogrel (PLAVIX) 75 MG tablet TAKE 1 TABLET BY MOUTH EVERY DAY WITH BREAKFAST 90 tablet 3   Continuous Glucose Receiver (DEXCOM G6 RECEIVER) DEVI Use as directed 1 each 0   Continuous Glucose Sensor (DEXCOM G6 SENSOR) MISC USE AS DIRECTED 3 each 1   Continuous Glucose Transmitter (DEXCOM G6 TRANSMITTER) MISC USE AS DIRECTED 1 each 1   FARXIGA 10 MG TABS tablet TAKE 1 TABLET BY MOUTH DAILY BEFORE BREAKFAST 30 tablet 11   Glycerin-Hypromellose-PEG 400 (VISINE DRY EYE OP) Place 1 drop into both eyes 3 (three) times daily as needed (dry eyes).     insulin degludec (TRESIBA FLEXTOUCH) 100 UNIT/ML FlexTouch Pen Inject 12 Units into the skin daily. 3 mL 5   isosorbide mononitrate (IMDUR) 120 MG 24 hr tablet TAKE 1 TABLET BY MOUTH EVERY DAY 90 tablet 3   JANUVIA 50 MG tablet TAKE 1 TABLET BY MOUTH EVERY DAY 90 tablet 1   lisinopril (ZESTRIL) 5 MG tablet Take 1 tablet (5 mg total) by mouth daily. 90 tablet 1   metoprolol succinate (TOPROL-XL) 25 MG 24 hr tablet TAKE 1 TABLET BY  MOUTH EVERY DAY *KEEP APPOINTMENT FOR REFILLS* 90 tablet 3   mupirocin ointment (BACTROBAN) 2 % Apply 1 application. topically 2 (two) times daily. 30 g 2   nitroGLYCERIN (NITROSTAT) 0.4 MG SL tablet PLACE 1 TABLET UNDER THE TONGUE EVERY 5 MINUTES AS NEEDED FOR CHEST PAIN. MAX 3. CALL 911 25 tablet 3   OLANZapine (ZYPREXA) 20 MG tablet Take 1 tablet (20 mg total) by mouth at bedtime. 90 tablet 0   PARoxetine (PAXIL) 20 MG tablet Take 3 tablets (60 mg total) by mouth daily. As of 04/08/22 270 tablet 0   ranolazine (RANEXA) 500 MG 12 hr tablet TAKE 1 TABLET BY MOUTH TWICE A DAY 180 tablet 3   XIIDRA 5 % SOLN Place 1 drop into both eyes in the morning and at bedtime.     No current facility-administered medications for this visit.      PHYSICAL EXAM Vitals:   09/28/22 0823  BP: 114/76  Pulse: 76  Resp: 20  Temp: 98.6 F (37 C)  SpO2: 97%  Weight: 129 lb (58.5 kg)  Height: 5\' 4"  (1.626 m)     Constitutional: well appearing. no distress. Appears well nourished.  Neurologic: CN intact. no focal findings. no sensory loss. Psychiatric:  Mood and affect symmetric and appropriate. Eyes:  No icterus. No conjunctival pallor. Ears, nose, throat:  mucous membranes moist. Midline trachea.  Cardiac: regular rate and rhythm.  Respiratory:  unlabored. Abdominal:  soft, non-tender, non-distended.  Peripheral vascular: no palpable femoral pulses. no palpable pedal pulses. Feet are warm. Extremity: no edema. no cyanosis. no pallor.  Skin: no gangrene. no ulceration.  Lymphatic: no Stemmer's sign. no palpable lymphadenopathy.  PERTINENT LABORATORY AND RADIOLOGIC DATA  Most recent CBC    Latest Ref Rng & Units 02/25/2022    9:50 AM 05/07/2021    1:49 AM 05/06/2021    3:51 AM  CBC  WBC 4.0 - 10.5 K/uL 6.1  9.7  10.3   Hemoglobin 12.0 - 15.0 g/dL 19.1  12.8  12.5   Hematocrit 36.0 - 46.0 % 36.5  37.3  36.4   Platelets 150.0 - 400.0 K/uL 187.0  199  180      Most recent CMP    Latest Ref Rng  & Units 07/06/2022    1:28 PM 03/29/2022    3:04 PM 02/25/2022    9:50 AM  CMP  Glucose 70 - 99 mg/dL 51   89   BUN 6 - 23 mg/dL 20   18   Creatinine 1.61 - 1.20 mg/dL 0.96   0.45   Sodium 409 - 145 mEq/L 138   138   Potassium 3.5 - 5.1 mEq/L 4.5   4.4   Chloride 96 - 112 mEq/L 103   106   CO2 19 - 32 mEq/L 24   26   Calcium 8.4 - 10.5 mg/dL 9.6   9.6   Total Protein 6.0 - 8.3 g/dL 7.0  6.6  6.8   Total Bilirubin 0.2 - 1.2 mg/dL 0.6  0.4  0.6   Alkaline Phos 39 - 117 U/L 123  152  128   AST 0 - 37 U/L 18  14  17    ALT 0 - 35 U/L 15  9  13      Renal function CrCl cannot be calculated (Patient's most recent lab result is older than the maximum 21 days allowed.).  Hgb A1c MFr Bld (%)  Date Value  07/06/2022 6.7 (H)    LDL Chol Calc (NIH)  Date Value Ref Range Status  07/17/2020 65 0 - 99 mg/dL Final   LDL Cholesterol  Date Value Ref Range Status  11/16/2021 77 0 - 99 mg/dL Final   Direct LDL  Date Value Ref Range Status  07/18/2008 145.2 mg/dL Final    Comment:    See lab report for associated comment(s)         +-------+-----------+-----------+------------+------------+  ABI/TBIToday's ABIToday's TBIPrevious ABIPrevious TBI  +-------+-----------+-----------+------------+------------+  Right 0.46       0.10       0.42        0             +-------+-----------+-----------+------------+------------+  Left  0.58       0          0.48        0.22          +-------+-----------+-----------+------------+------------+        [name]. [name], MD [facility] Phone Number: [phone] 09/26/2022 11:05 AM  Total time spent on preparing this encounter including chart review, data review, collecting history, examining the patient, coordinating care for this established patient, 30 minutes  Portions of this report may have been transcribed using voice recognition software.  Every effort has been made to ensure  accuracy; however, inadvertent computerized transcription errors may still be present.

## 2022-09-28 ENCOUNTER — Ambulatory Visit: Payer: 59 | Admitting: Vascular Surgery

## 2022-09-28 ENCOUNTER — Encounter: Payer: Self-pay | Admitting: Vascular Surgery

## 2022-09-28 VITALS — BP 114/76 | HR 76 | Temp 98.6°F | Resp 20 | Ht 64.0 in | Wt 129.0 lb

## 2022-09-28 DIAGNOSIS — I70213 Atherosclerosis of native arteries of extremities with intermittent claudication, bilateral legs: Secondary | ICD-10-CM

## 2022-10-04 ENCOUNTER — Other Ambulatory Visit: Payer: Self-pay

## 2022-10-04 DIAGNOSIS — I70213 Atherosclerosis of native arteries of extremities with intermittent claudication, bilateral legs: Secondary | ICD-10-CM

## 2022-10-05 ENCOUNTER — Telehealth: Payer: Self-pay | Admitting: Family

## 2022-10-05 ENCOUNTER — Ambulatory Visit: Payer: 59 | Admitting: Family

## 2022-10-05 VITALS — BP 101/65 | HR 78 | Temp 97.9°F | Resp 16 | Wt 129.0 lb

## 2022-10-05 DIAGNOSIS — E049 Nontoxic goiter, unspecified: Secondary | ICD-10-CM

## 2022-10-05 DIAGNOSIS — E785 Hyperlipidemia, unspecified: Secondary | ICD-10-CM

## 2022-10-05 DIAGNOSIS — Z7984 Long term (current) use of oral hypoglycemic drugs: Secondary | ICD-10-CM | POA: Diagnosis not present

## 2022-10-05 DIAGNOSIS — I10 Essential (primary) hypertension: Secondary | ICD-10-CM

## 2022-10-05 DIAGNOSIS — I739 Peripheral vascular disease, unspecified: Secondary | ICD-10-CM

## 2022-10-05 DIAGNOSIS — E119 Type 2 diabetes mellitus without complications: Secondary | ICD-10-CM | POA: Diagnosis not present

## 2022-10-05 DIAGNOSIS — Z87891 Personal history of nicotine dependence: Secondary | ICD-10-CM

## 2022-10-05 NOTE — Assessment & Plan Note (Signed)
Lab Results  Component Value Date   TSH 0.68 02/25/2022

## 2022-10-05 NOTE — Telephone Encounter (Signed)
Please call [facility] and request copy of mammogram and pap. You can wait until tomorrow because mammo was just done today and may not be back.

## 2022-10-05 NOTE — Assessment & Plan Note (Signed)
BP Readings from Last 3 Encounters:  10/05/22 101/65  09/28/22 114/76  08/10/22 (!) 89/57   A bit low today.  Likely secondary to addition of clonidine per psychiatry.  Monitor.

## 2022-10-05 NOTE — Assessment & Plan Note (Signed)
Symptomatic.  Recently saw Vascular for these concerns and they placed an order for CT Angio of the aorta and bifemoral.

## 2022-10-05 NOTE — Progress Notes (Signed)
Subjective:   By signing my name below, I, Bailey Greene, attest that this documentation has been prepared under the direction and in the presence of Lemont Fillers, NP 10/05/22   Patient ID: Bailey Greene, female    DOB: November 19, 1960, 62 y.o.   MRN: [id number]  Chief Complaint  Patient presents with   Diabetes    Here for follow up   Hypertension    Here for follow up   Leg Pain    Complains of bilateral leg pain     HPI Patient is in today for a 3 month follow up.   Diabetes: She reports her blood sugars have been relatively low. Her blood sugar only gets around 100-110 after she eats. She is compliant with Darien Ramus, and Tresiba.   Leg pain: She complains of bilateral leg pain that radiates down to there ankles. She reports the longer she sits, the more pain she has. She has followed up with vascular and has a CT scheduled.   Smoking: She reports having quit smoking about 3.5 weeks ago.   Mood: She has been compliant with Clonidine. She follows up with psychiatry.   Screenings: She had her mammogram and pap smear today at [facility].   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   Qualifier: Diagnosis of  By: Cheri Guppy     Diabetes mellitus type 2 in nonobese (HCC) 07/2016   Dyslipidemia    Headache(784.0)    History of pulmonary embolism 07/08/2020   HTN (hypertension)    Migraine    NSTEMI (non-ST elevated myocardial infarction) (HCC)    PAD (peripheral artery disease) (HCC)    Pulmonary embolus (HCC)    unprovoked    Past Surgical History:  Procedure Laterality Date   BUNIONECTOMY  08/2011   right foot   CARDIAC CATHETERIZATION  2007   severe native 3 v dz, all grafts patent (LIMA-LAD, RIMA-Diag, SVG-OM1-OM2, R radial-PDA)   CESAREAN SECTION  1984   CORONARY ARTERY  BYPASS GRAFT  2006    Coronary artery bypass grafting x5 with a left  internal  mammary to the left anterior descending coronary artery.  Free right  internal mammary to the diagonal coronary artery, sequential reverse  saphenous vein graft to the first and second obtuse marginal, right  artery bypass to the posterior descending coronary artery with endo-vein harvesting.   LEFT HEART CATH AND CORS/GRAFTS ANGIOGRAPHY N/A 05/05/2021   Procedure: LEFT HEART CATH AND CORS/GRAFTS ANGIOGRAPHY;  Surgeon: Marykay Lex, MD;  Location: [facility] INVASIVE CV LAB;  Service: Cardiovascular;  Laterality: N/A;   UMBILICAL HERNIA REPAIR N/A 01/16/2021   Procedure: UMBILICAL  HERNIA REPAIR;  Surgeon: Fritzi Mandes, MD;  Location: [facility] OR;  Service: General;  Laterality: N/A;    Family History  Problem Relation Age of Onset   Lupus Mother    Heart attack Father    Diabetes Father    Hypertension Father    Diabetes Paternal Grandmother    Hypertension Paternal Grandmother    Stroke Neg Hx    Kidney disease Neg Hx    Hyperlipidemia Neg Hx    Sudden death Neg Hx     Social History   Socioeconomic History   Marital status: Legally Separated    Spouse name: Not on file  Number of children: Not on file   Years of education: Not on file   Highest education level: Some college, no degree  Occupational History   Occupation: Armed forces training and education officer  Tobacco Use   Smoking status: Former    Years: 25    Types: Cigarettes    Quit date: 09/12/2021    Years since quitting: 1.0   Smokeless tobacco: Never   Tobacco comments:    0.5 pack cigarettes  per month per patient   Vaping Use   Vaping Use: Never used  Substance and Sexual Activity   Alcohol use: No    Alcohol/week: 0.0 standard drinks of alcohol   Drug use: Never   Sexual activity: Yes    Partners: Male    Comment: married  Other Topics Concern   Not on file  Social History Narrative   Regular exercise:  3 days weekly   Caffeine Use:  1 soda  daily   One child biological daughter born in 32 and an adopted niece.   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          Social Determinants of Health   Financial Resource Strain: Medium Risk (10/05/2022)   Overall Financial Resource Strain (CARDIA)    Difficulty of Paying Living Expenses: Somewhat hard  Food Insecurity: Food Insecurity Present (10/05/2022)   Hunger Vital Sign    Worried About Running Out of Food in the Last Year: Sometimes true    Ran Out of Food in the Last Year: Sometimes true  Transportation Needs: No Transportation Needs (10/05/2022)   PRAPARE - Administrator, Civil Service (Medical): No    Lack of Transportation (Non-Medical): No  Physical Activity: Insufficiently Active (10/05/2022)   Exercise Vital Sign    Days of Exercise per Week: 3 days    Minutes of Exercise per Session: 20 min  Stress: Stress Concern Present (10/05/2022)   [employer] of Occupational Health - Occupational Stress Questionnaire    Feeling of Stress : Rather much  Social Connections: Unknown (10/05/2022)   Social Connection and Isolation Panel [NHANES]    Frequency of Communication with Friends and Family: Twice a week    Frequency of Social Gatherings with Friends and Family: Once a week    Attends Religious Services: Patient declined    Database administrator or Organizations: No    Attends Engineer, structural: Not on file    Marital Status: Married  Catering manager Violence: Not on file    Outpatient Medications Prior to Visit  Medication Sig Dispense Refill   ACCU-CHEK GUIDE test strip USE UP TO FOUR TIMES DAILY AS DIRECTED 400 strip 6   Accu-Chek Softclix Lancets lancets USE UP TO FOUR TIMES DAILY AS DIRECTED 100 each 12   ALPRAZolam (XANAX) 0.5 MG tablet TAKE 1 TABLET (0.5 MG TOTAL) BY MOUTH 3 (THREE) TIMES DAILY AS NEEDED FOR ANXIETY. 90 tablet 1   aspirin 81 MG EC tablet Take 1 tablet (81 mg total) by mouth daily. Swallow  whole. 30 tablet 12   atorvastatin (LIPITOR) 80 MG tablet TAKE 1 TABLET BY MOUTH DAILY 90 tablet 3   BD PEN NEEDLE NANO U/F 32G X 4 MM MISC USE AS DIRECTED 300 each 1   blood glucose meter kit and supplies KIT Dispense based on patient and insurance preference. Use up to four times daily as directed. (FOR ICD-9 250.00, 250.01). 1 each 0   Cholecalciferol (VITAMIN D3) 75 MCG (  3000 UT) TABS Take 1 tablet by mouth daily at 6 (six) AM. 30 tablet    cilostazol (PLETAL) 100 MG tablet Take 1 tablet (100 mg total) by mouth 2 (two) times daily before a meal. 60 tablet 11   cloNIDine (CATAPRES) 0.1 MG tablet 1 in the AM and 3 PM and 1 extra daily as needed for anxiety 270 tablet 0   clopidogrel (PLAVIX) 75 MG tablet TAKE 1 TABLET BY MOUTH EVERY DAY WITH BREAKFAST 90 tablet 3   Continuous Glucose Receiver (DEXCOM G6 RECEIVER) DEVI Use as directed 1 each 0   Continuous Glucose Sensor (DEXCOM G6 SENSOR) MISC USE AS DIRECTED 3 each 1   Continuous Glucose Transmitter (DEXCOM G6 TRANSMITTER) MISC USE AS DIRECTED 1 each 1   FARXIGA 10 MG TABS tablet TAKE 1 TABLET BY MOUTH DAILY BEFORE BREAKFAST 30 tablet 11   Glycerin-Hypromellose-PEG 400 (VISINE DRY EYE OP) Place 1 drop into both eyes 3 (three) times daily as needed (dry eyes).     insulin degludec (TRESIBA FLEXTOUCH) 100 UNIT/ML FlexTouch Pen Inject 12 Units into the skin daily. 3 mL 5   isosorbide mononitrate (IMDUR) 120 MG 24 hr tablet TAKE 1 TABLET BY MOUTH EVERY DAY 90 tablet 3   JANUVIA 50 MG tablet TAKE 1 TABLET BY MOUTH EVERY DAY 90 tablet 1   lisinopril (ZESTRIL) 5 MG tablet Take 1 tablet (5 mg total) by mouth daily. 90 tablet 1   metoprolol succinate (TOPROL-XL) 25 MG 24 hr tablet TAKE 1 TABLET BY MOUTH EVERY DAY *KEEP APPOINTMENT FOR REFILLS* 90 tablet 3   mupirocin ointment (BACTROBAN) 2 % Apply 1 application. topically 2 (two) times daily. 30 g 2   nitroGLYCERIN (NITROSTAT) 0.4 MG SL tablet PLACE 1 TABLET UNDER THE TONGUE EVERY 5 MINUTES AS NEEDED  FOR CHEST PAIN. MAX 3. CALL 911 25 tablet 3   OLANZapine (ZYPREXA) 20 MG tablet Take 1 tablet (20 mg total) by mouth at bedtime. 90 tablet 0   PARoxetine (PAXIL) 20 MG tablet Take 3 tablets (60 mg total) by mouth daily. As of 04/08/22 270 tablet 0   ranolazine (RANEXA) 500 MG 12 hr tablet TAKE 1 TABLET BY MOUTH TWICE A DAY 180 tablet 3   XIIDRA 5 % SOLN Place 1 drop into both eyes in the morning and at bedtime.     No facility-administered medications prior to visit.    Allergies  Allergen Reactions   Metformin And Related Nausea And Vomiting    Review of Systems  Musculoskeletal:  Positive for myalgias (leg pain).       Objective:    Physical Exam Constitutional:      General: She is not in acute distress.    Appearance: Normal appearance. She is well-developed.  HENT:     Head: Normocephalic and atraumatic.     Right Ear: External ear normal.     Left Ear: External ear normal.  Eyes:     General: No scleral icterus. Neck:     Thyroid: No thyromegaly.  Cardiovascular:     Rate and Rhythm: Normal rate and regular rhythm.     Pulses:          Dorsalis pedis pulses are 1+ on the right side and 1+ on the left side.       Posterior tibial pulses are 1+ on the right side and 1+ on the left side.     Heart sounds: Normal heart sounds. No murmur heard. Pulmonary:     Effort:  Pulmonary effort is normal. No respiratory distress.     Breath sounds: Normal breath sounds. No wheezing.  Musculoskeletal:     Cervical back: Neck supple.  Skin:    General: Skin is warm and dry.  Neurological:     Mental Status: She is alert and oriented to person, place, and time.  Psychiatric:        Mood and Affect: Mood normal.        Behavior: Behavior normal.        Thought Content: Thought content normal.        Judgment: Judgment normal.     BP 101/65 (BP Location: Right Arm, Patient Position: Sitting, Cuff Size: Small)   Pulse 78   Temp 97.9 F (36.6 C) (Oral)   Resp 16   Wt 129 lb  (58.5 kg)   LMP 11/28/2009   SpO2 99%   BMI 22.14 kg/m  Wt Readings from Last 3 Encounters:  10/05/22 129 lb (58.5 kg)  09/28/22 129 lb (58.5 kg)  08/10/22 130 lb (59 kg)       Assessment & Plan:  Diabetes mellitus type 2 in nonobese [facility]) Assessment & Plan: Lab Results  Component Value Date   HGBA1C 6.7 (H) 07/06/2022   HGBA1C 6.6 (H) 02/25/2022   HGBA1C 6.6 (H) 11/03/2021   Lab Results  Component Value Date   MICROALBUR <0.7 02/25/2022   LDLCALC 77 11/16/2021   CREATININE 1.13 07/06/2022   Reports home readings have been good. Continue Suanne Marker.   Orders: -     Hemoglobin A1c  Essential hypertension Assessment & Plan: BP Readings from Last 3 Encounters:  10/05/22 101/65  09/28/22 114/76  08/10/22 (!) 89/57   A bit low today.  Likely secondary to addition of clonidine per psychiatry.  Monitor.    Goiter Assessment & Plan: Lab Results  Component Value Date   TSH 0.68 02/25/2022     Orders: -     TSH  Hyperlipidemia with target LDL less than 70 Assessment & Plan: Lab Results  Component Value Date   CHOL 145 11/16/2021   HDL 47.60 11/16/2021   LDLCALC 77 11/16/2021   LDLDIRECT 145.2 07/18/2008   TRIG 101.0 11/16/2021   CHOLHDL 3 11/16/2021   Maintained on atorvastatin 80mg .   Orders: -     Lipid panel -     Comprehensive metabolic panel  PVD (peripheral vascular disease) (HCC) Assessment & Plan: Symptomatic.  Recently saw Vascular for these concerns and they placed an order for CT Angio of the aorta and bifemoral.    History of tobacco abuse Assessment & Plan: She quit 3 weeks ago- I commended her for this.       I,Rachel Rivera,acting as a Neurosurgeon for Lemont Fillers, NP.,have documented all relevant documentation on the behalf of Lemont Fillers, NP,as directed by  Lemont Fillers, NP while in the presence of Lemont Fillers, NP.   I, Lemont Fillers, NP, personally preformed the services  described in this documentation.  All medical record entries made by the scribe were at my direction and in my presence.  I have reviewed the chart and discharge instructions (if applicable) and agree that the record reflects my personal performance and is accurate and complete. 10/05/22   Lemont Fillers, NP

## 2022-10-05 NOTE — Assessment & Plan Note (Signed)
She quit 3 weeks ago- I commended her for this.

## 2022-10-05 NOTE — Assessment & Plan Note (Addendum)
Lab Results  Component Value Date   CHOL 145 11/16/2021   HDL 47.60 11/16/2021   LDLCALC 77 11/16/2021   LDLDIRECT 145.2 07/18/2008   TRIG 101.0 11/16/2021   CHOLHDL 3 11/16/2021   Maintained on atorvastatin 80mg .

## 2022-10-05 NOTE — Telephone Encounter (Signed)
Electronic request sent to gyn

## 2022-10-05 NOTE — Assessment & Plan Note (Signed)
>>  ASSESSMENT AND PLAN FOR ESSENTIAL HYPERTENSION WRITTEN ON 10/05/2022  2:31 PM BY O'SULLIVAN, Kye Silverstein, NP  BP Readings from Last 3 Encounters:  10/05/22 101/65  09/28/22 114/76  08/10/22 (!) 89/57   A bit low today.  Likely secondary to addition of clonidine  per psychiatry.  Monitor.

## 2022-10-05 NOTE — Assessment & Plan Note (Addendum)
Lab Results  Component Value Date   HGBA1C 6.7 (H) 07/06/2022   HGBA1C 6.6 (H) 02/25/2022   HGBA1C 6.6 (H) 11/03/2021   Lab Results  Component Value Date   MICROALBUR <0.7 02/25/2022   LDLCALC 77 11/16/2021   CREATININE 1.13 07/06/2022   Reports home readings have been good. Continue Suanne Marker.

## 2022-10-06 LAB — COMPREHENSIVE METABOLIC PANEL
ALT: 63 U/L — ABNORMAL HIGH (ref 0–35)
AST: 48 U/L — ABNORMAL HIGH (ref 0–37)
Albumin: 4.1 g/dL (ref 3.5–5.2)
Alkaline Phosphatase: 137 U/L — ABNORMAL HIGH (ref 39–117)
BUN: 19 mg/dL (ref 6–23)
CO2: 28 mEq/L (ref 19–32)
Calcium: 9.3 mg/dL (ref 8.4–10.5)
Chloride: 105 mEq/L (ref 96–112)
Creatinine, Ser: 1.24 mg/dL — ABNORMAL HIGH (ref 0.40–1.20)
GFR: 47.52 mL/min — ABNORMAL LOW (ref >60.00)
Glucose, Bld: 136 mg/dL — ABNORMAL HIGH (ref 70–99)
Potassium: 4.4 mEq/L (ref 3.5–5.1)
Sodium: 139 mEq/L (ref 135–145)
Total Bilirubin: 0.6 mg/dL (ref 0.2–1.2)
Total Protein: 6.3 g/dL (ref 6.0–8.3)

## 2022-10-06 LAB — LIPID PANEL
Cholesterol: 130 mg/dL (ref 0–200)
HDL: 47.7 mg/dL (ref >39.00)
LDL Cholesterol: 70 mg/dL (ref 0–99)
NonHDL: 82.5
Total CHOL/HDL Ratio: 3
Triglycerides: 64 mg/dL (ref 0.0–149.0)
VLDL: 12.8 mg/dL (ref 0.0–40.0)

## 2022-10-06 LAB — TSH: TSH: 0.4 u[IU]/mL (ref 0.35–5.50)

## 2022-10-06 LAB — HEMOGLOBIN A1C: Hgb A1c MFr Bld: 6.5 % (ref 4.6–6.5)

## 2022-10-09 ENCOUNTER — Encounter: Payer: Self-pay | Admitting: Family

## 2022-10-26 ENCOUNTER — Ambulatory Visit (HOSPITAL_COMMUNITY)
Admission: RE | Admit: 2022-10-26 | Discharge: 2022-10-26 | Disposition: A | Discharge status: Home / Self Care | Payer: 59 | Source: Ambulatory Visit | Attending: Vascular Surgery | Admitting: Vascular Surgery

## 2022-10-26 DIAGNOSIS — I70213 Atherosclerosis of native arteries of extremities with intermittent claudication, bilateral legs: Secondary | ICD-10-CM | POA: Insufficient documentation

## 2022-10-26 MED ORDER — IOHEXOL 350 MG/ML SOLN
70.0000 mL | Freq: Once | INTRAVENOUS | Status: AC | PRN
  Administered 2022-10-26: 70 mL via INTRAVENOUS

## 2022-10-27 ENCOUNTER — Telehealth: Payer: Self-pay | Admitting: Cardiology

## 2022-10-27 NOTE — Telephone Encounter (Signed)
This was sent via Mychart to our scheduling pool:    Good morning  I have been experiencing CP on and off coming and going it feels like my heart racing I have been taking the Nitro . I have been experiencing sweating and nausea.     Good Morning,  Please answer the following questions so that I may share this information with the nurse regarding your symptoms.   1. Are you having CP right now?   2. Are you experiencing any other symptoms (ex. SOB, nausea, vomiting, sweating)?   3. How long have you been experiencing CP?   4. Is your CP continuous or coming and going?   5. Have you taken Nitroglycerin?  ?      Hi I am having chest pain and my heart is racing has been going on for a couple of days. Today has been the worst started this morning at work. I am having a lot if anxiety. I was at [facility] today having a CT Scan and I was having anxiety as well I couldn't breath the person doing the CT Scan walked me out i tried to talk and I couldn't catch my breath. So it's a number of things going on. At this point I don't know what to do.

## 2022-10-27 NOTE — Telephone Encounter (Signed)
Call to patient and LVM.  Advised that she could call but her symptoms are concerning.  If she does not reach [place] in business hours she should go to ED to be seen

## 2022-10-28 NOTE — Progress Notes (Deleted)
Cardiology Office Note:    Date:  10/28/2022   ID:  Bailey Greene, DOB 1960-12-10, MRN [id number]  PCP:  Sandford Craze, NP  Cardiologist:  Rollene Rotunda, MD  Electrophysiologist:  None   Referring MD: Sandford Craze, NP   No chief complaint on file. ***  History of Present Illness:    Bailey Greene is a 62 y.o. female with a hx of CAD status post CABG x 5 in 2006, T2DM, hyperlipidemia, PE, hypertension, PAD who presents as a DOD visit for chest pain.  She follows with Dr. Antoine Poche, last seen 04/2022.  Admitted with NSTEMI 05/05/2021.  Cath at that time showed patent LIMA-LAD, RIMA-diagonal, radial-RPDA; culprit lesion thought to be 100% occluded SVG-OM1-OM 2 with patent OM1-OM 2 OM.  Medical therapy recommended.  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   Qualifier: Diagnosis of  By: Cheri Guppy     Diabetes mellitus type 2 in nonobese (HCC) 07/2016   Dyslipidemia    Headache(784.0)    History of pulmonary embolism 07/08/2020   HTN (hypertension)    Migraine    NSTEMI (non-ST elevated myocardial infarction) (HCC)    PAD (peripheral artery disease) (HCC)    Pulmonary embolus (HCC)    unprovoked    Past Surgical History:  Procedure Laterality Date   BUNIONECTOMY  08/2011   right foot   CARDIAC CATHETERIZATION  2007   severe native 3 v dz, all grafts patent (LIMA-LAD, RIMA-Diag, SVG-OM1-OM2, R radial-PDA)   CESAREAN SECTION  1984   CORONARY ARTERY BYPASS GRAFT  2006    Coronary artery bypass grafting x5 with a left  internal  mammary to the left anterior descending coronary artery.  Free right  internal mammary to the diagonal coronary artery, sequential reverse  saphenous vein graft to the first and second obtuse marginal, right  artery bypass to the posterior descending coronary artery  with endo-vein harvesting.   LEFT HEART CATH AND CORS/GRAFTS ANGIOGRAPHY N/A 05/05/2021   Procedure: LEFT HEART CATH AND CORS/GRAFTS ANGIOGRAPHY;  Surgeon: Marykay Lex, MD;  Location: [facility] INVASIVE CV LAB;  Service: Cardiovascular;  Laterality: N/A;   UMBILICAL HERNIA REPAIR N/A 01/16/2021   Procedure: UMBILICAL  HERNIA REPAIR;  Surgeon: Fritzi Mandes, MD;  Location: [facility] OR;  Service: General;  Laterality: N/A;    Current Medications: No outpatient medications have been marked as taking for the 10/29/22 encounter (Appointment) with Little Ishikawa, MD.     Allergies:   Metformin and related   Social History   Socioeconomic History   Marital status: Legally Separated    Spouse name: Not on file   Number of children: Not on file   Years of education: Not on file   Highest education level: Some college, no degree  Occupational History   Occupation: mortgage loan specialist  Tobacco Use   Smoking status: Former    Years: 25    Types: Cigarettes    Quit date: 09/12/2021    Years since quitting: 1.1   Smokeless tobacco: Never   Tobacco comments:    0.5 pack cigarettes  per month per patient   Vaping Use   Vaping Use: Never used  Substance and Sexual Activity   Alcohol use: No    Alcohol/week: 0.0 standard drinks of alcohol   Drug use: Never  Sexual activity: Yes    Partners: Male    Comment: married  Other Topics Concern   Not on file  Social History Narrative   Regular exercise:  3 days weekly   Caffeine Use:  1 soda daily   One child biological daughter born in 12 and an adopted niece.   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          Social Determinants of Health   Financial Resource Strain: Medium Risk (10/05/2022)   Overall Financial Resource Strain (CARDIA)    Difficulty of Paying Living Expenses: Somewhat hard  Food Insecurity: Food Insecurity Present (10/05/2022)   Hunger Vital Sign    Worried About Running Out of Food in  the Last Year: Sometimes true    Ran Out of Food in the Last Year: Sometimes true  Transportation Needs: No Transportation Needs (10/05/2022)   PRAPARE - Administrator, Civil Service (Medical): No    Lack of Transportation (Non-Medical): No  Physical Activity: Insufficiently Active (10/05/2022)   Exercise Vital Sign    Days of Exercise per Week: 3 days    Minutes of Exercise per Session: 20 min  Stress: Stress Concern Present (10/05/2022)   [employer] of Occupational Health - Occupational Stress Questionnaire    Feeling of Stress : Rather much  Social Connections: Unknown (10/05/2022)   Social Connection and Isolation Panel [NHANES]    Frequency of Communication with Friends and Family: Twice a week    Frequency of Social Gatherings with Friends and Family: Once a week    Attends Religious Services: Patient declined    Database administrator or Organizations: No    Attends Engineer, structural: Not on file    Marital Status: Married     Family History: The patient's ***family history includes Diabetes in her father and paternal grandmother; Heart attack in her father; Hypertension in her father and paternal grandmother; Lupus in her mother. There is no history of Stroke, Kidney disease, Hyperlipidemia, or Sudden death.  ROS:   Please see the history of present illness.    *** All other systems reviewed and are negative.  EKGs/Labs/Other Studies Reviewed:    The following studies were reviewed today: ***  EKG:  EKG is *** ordered today.  The ekg ordered today demonstrates ***  Recent Labs: 02/25/2022: Hemoglobin 12.3; Platelets 187.0 10/05/2022: ALT 63; BUN 19; Creatinine, Ser 1.24; Potassium 4.4; Sodium 139; TSH 0.40  Recent Lipid Panel    Component Value Date/Time   CHOL 130 10/05/2022 1444   CHOL 139 07/17/2020 1009   TRIG 64.0 10/05/2022 1444   HDL 47.70 10/05/2022 1444   HDL 58 07/17/2020 1009   CHOLHDL 3 10/05/2022 1444   VLDL 12.8 10/05/2022  1444   LDLCALC 70 10/05/2022 1444   LDLCALC 65 07/17/2020 1009   LDLDIRECT 145.2 07/18/2008 0821    Physical Exam:    VS:  LMP 11/28/2009     Wt Readings from Last 3 Encounters:  10/05/22 129 lb (58.5 kg)  09/28/22 129 lb (58.5 kg)  08/10/22 130 lb (59 kg)     GEN: *** Well nourished, well developed in no acute distress HEENT: Normal NECK: No JVD; No carotid bruits LYMPHATICS: No lymphadenopathy CARDIAC: ***RRR, no murmurs, rubs, gallops RESPIRATORY:  Clear to auscultation without rales, wheezing or rhonchi  ABDOMEN: Soft, non-tender, non-distended MUSCULOSKELETAL:  No edema; No deformity  SKIN: Warm and dry NEUROLOGIC:  Alert and oriented x  3 PSYCHIATRIC:  Normal affect   ASSESSMENT:    No diagnosis found. PLAN:    Chest pain: History of CABG x 5 in 2006.  Admitted with NSTEMI 05/05/2021.  Cath at that time showed patent LIMA-LAD, RIMA-diagonal, radial-RPDA; culprit lesion thought to be 100% occluded SVG-OM1-OM 2 with patent OM1-OM 2 OM.  Medical therapy recommended.  Echo 08/14/2020 showed EF 60 to 65%.  Reports chest pain. -Stress PET*** -Continue aspirin, Plavix, statin -Continue Toprol-XL 25 mg daily -Continue Imdur 120 mg daily -As needed sublingual nitroglycerin  Hypertension: On clonidine 0.1 mg twice daily, Imdur 120 mg daily, lisinopril 5 mg daily, Toprol-XL 25 mg daily  Hyperlipidemia: On atorvastatin 80 mg daily  Tobacco use:***  T2DM: On insulin  RTC in***   Medication Adjustments/Labs and Tests Ordered: Current medicines are reviewed at length with the patient today.  Concerns regarding medicines are outlined above.  No orders of the defined types were placed in this encounter.  No orders of the defined types were placed in this encounter.   There are no Patient Instructions on file for this visit.   Signed, Little Ishikawa, MD  10/28/2022 10:38 PM    [facility]

## 2022-10-28 NOTE — Telephone Encounter (Signed)
States did not go to ED and states pain subsided.  She was still having issues this morning and states "I should have gone but didn't". States yesterday 2 nitro and relieved the pain for a time.   States the pain has been for a week on and off. Left side chest pain, achy tightening feeling. Non radiating. States "used to go down her arm" but that was 3 years ago. States heart is still racing.  States a lot with work and health issues .  No nausea or sweating today. No nitro taken today.  States pain has subsided at this time.  Offered an appt for tomorrow with DOD and she states she "knows I have to take what I can get but I have to see if I can get off work." Advised I would set the 3:30 spot and if unable to make it to call in the AM and cancel  Advised that if she starts to have chest pain again and accompanied by sweating nausea or other symptoms, she needs to go directly to the ED.  She states understanding.

## 2022-10-29 ENCOUNTER — Ambulatory Visit: Payer: 59 | Admitting: Family

## 2022-10-29 ENCOUNTER — Ambulatory Visit: Payer: 59 | Admitting: Cardiology

## 2022-11-01 ENCOUNTER — Encounter: Payer: Self-pay | Admitting: Psychiatry

## 2022-11-01 ENCOUNTER — Ambulatory Visit (INDEPENDENT_AMBULATORY_CARE_PROVIDER_SITE_OTHER): Payer: 59 | Admitting: Psychiatry

## 2022-11-01 DIAGNOSIS — F411 Generalized anxiety disorder: Secondary | ICD-10-CM | POA: Diagnosis not present

## 2022-11-01 DIAGNOSIS — F5105 Insomnia due to other mental disorder: Secondary | ICD-10-CM

## 2022-11-01 DIAGNOSIS — F4001 Agoraphobia with panic disorder: Secondary | ICD-10-CM | POA: Diagnosis not present

## 2022-11-01 DIAGNOSIS — F3162 Bipolar disorder, current episode mixed, moderate: Secondary | ICD-10-CM | POA: Diagnosis not present

## 2022-11-01 MED ORDER — PAROXETINE HCL 40 MG PO TABS
40.0000 mg | ORAL_TABLET | Freq: Two times a day (BID) | ORAL | 0 refills | Status: DC
Start: 2022-11-01 — End: 2022-12-21

## 2022-11-01 NOTE — Progress Notes (Unsigned)
LCIA severe stenosis L SFA focal stenosis  RCFA moderate stenosis RSFA flush occlusion at origin ? Tibial reconstitution  Plan RCFA access --> L SFA intervention LCFA access --> "kissing" iliac stents Shoot runoff from RCFA access RCFA endart + RCFA - popliteal vs tibial bypass

## 2022-11-01 NOTE — Patient Instructions (Signed)
Increase paroxetine to 2 of the 20 mg talbets or 40 mg twice daily

## 2022-11-01 NOTE — Progress Notes (Signed)
Shynice Woodhouse [id number] [birth year] 62 y.o.   Subjective:   Patient ID:  Oktober Glueckert is a 62 y.o. (DOB [birth year]) female.  Chief Complaint:  Chief Complaint  Patient presents with   Follow-up   Anxiety   Depression    Anxiety Symptoms include nervous/anxious behavior. Patient reports no decreased concentration, dizziness, nausea or palpitations.     Amita Quarles presents for follow-up of bipolar disorder and panic attacks and general anxiety.  visit 5/22,2020.  Increased Depakote then to 1500 mg daily.  Boss rec LOA for 4 weeks.  Going through separation is really tough.  Panic attacks at work interfering.  Had this job 10 years.  Likes the job but hard to concentrate and stay focused.  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  Panic incr pulse and SOB, fear, sweating and shakey.  Panic lasts 20 mins and increased frequency.  Several in a day.  Going on for 2 mos but getting worse.  Boss can tell from listening to her calls and drop in production.  At follow up visit November 22, 2018.  Mood swings are better.  Trouble staying asleep.  Caffeine 1 coffee and 1 soda daily.She was still having severe anxiety plus panic.  We discussed the risk of SSRIs trickling triggering mood swings but the severity of the anxiety was such that we decided to initiate fluoxetine 10 mg daily to increase to 20 mg daily.  We also were using low-dose mirtazapine to help with sleep.  seen January 18, 2019.  She was granted medical leave for panic symptoms.  She was switched to paroxetine for panic from fluoxetine.  Since she was switched from mirtazapine to quetiapine 25 to 50 mg nightly for insomnia.   November 2020 visit with the following noted: It is helping some with anxiety but a lot of stress.  No unusual mood swings without a change.   She's satisfied with the 20 mg paroxetine. Sleep is much better with quetiapine and xanax at night.  5 hour and sometimes better depending on work schedule.    No meds were changed.    10/23/2019 visit with the following noted: Anxiety, dep, panic attacks all worse lately for 2 mos.  Anger also worse mostly just at home bc manages it at work.  Several triggers.  Sister passed March 26 after illness.  Work and daughter are stressful.  Overwhelming. Not as good staying asleep.  Random panic.  Feels SOB.  Wanting to isolate. Spontaneous crying spell.s Poor concentration affecting work.    Plan: Increase paroxetine to 1-1/2 of the 20 mg tablets daily For sleep increase quetiapine to 2 the 25 mg tablets nightly  03/05/20 appt with following noted: Less anxious and sleeping is better.  Panic can be triggered at work with SOB and heart racing.  Happens about 2 times weekly. Main stress is work is overwhelming.  Talk with customers all day.   No clear mania.   Still some depression too. No SE Plan: no med changes except increase paroxetine to 40 for anxiety  09/02/2020 appt noted: Dx DM since here. Anxiety and panic worse since January.  Consistent with meds.  Stress dx DM.  Sister passed away.  Panic with SOB and occ sweats.  Panic daily. Usually before noon usually with trigger. Xanax makes her relax. 1 cup coffee AM.   Sleep 4-5 hours with quetiapine 25 mg.   And pretty consistent. Mood feels labile.  About to lose it including irritable and angry.  Plan: Increase Quetiapine 25 mg 2 mg HS.   12/10/20 appt noted: Dizziniess for awhile after paroxetine resolved. Still having anxiety and panic but not daily.   Average 7/10 anxiety usually triggered with work as primary stress.  She and H still dealing with problems.  Panic worse either in the AM or evening. Sleep is better 4-5 hours nightly. Taking quetiapine 50 mg with Xanax at night. Still on Depakote ER 1500, busipirone 30 BID , paroxetine 40 mg daily No anger problems at work.  Appetite is better.  Regaining some weight. Evening walks helps mental health. Plan: Start olanzapine 5 mg daily and if  that is not sufficient within a week increase to 10 mg nightly.  We can go higher if needed and call if necessary. BC failure of alternatives, alprazolam 0.5 mg AM before work.   02/18/2021 appointment with the following noted: Anx down to 4-5/10.  Notices calmer with Xanax and throughout the day.Less anxiety and panic at work but getting better. Depression about the same 6/10.  Tries to distract herself from problems at home.. Sleep 6 hours now with olanzapine.   No SE Plan: increase olanzapine 15 mg HS. BC failure of alternatives, alprazolam 0.5 mg AM before work.   paroxetine to 40 mg at night.  04/28/2021 appointment with the following noted: Increase olanzapine 15 mg HS helped awhile with initial dizziness resolved.  Not drowsy. Still better than it was but holidays are hard.  Better than in a long time overall.  H saw a difference also.   Sleep 5-6 hours.  Never been a 7 hour sleeper.   Eating is normal now. Depression still there but better also 3/10.  Can enjoy some things. Better interest. Most stressful thing about work is the work load and talking with customers who  are difficult.  Had this job 13 years. Plan: Increase olanzapine 20 mg 3 hours before HS.  06/10/2021 phone call complaining of persistent insomnia.  She was allowed to increase alprazolam to 0.5 mg every morning and 1 mg nightly  06/30/2021 appointment with the following noted: Taking alprazolam 0.5 mg 2 daily usually. Increased olanzapine to 20 mg daily. CABG 2006.  Valve problem with CP and hospitalized.  Change in med. Going better now. No problems with meds.   About 6 hour sleep and in bed earlier.  Depression is OK overall but residual anxiety and crying.  Anxiety around health now too and work. Panic is not gone but better on Paroxetine 20 Sometimes brief irritab ility situationally. Plan: No med changes.  Continue olanzapine 20 mg, paroxetine 40 mg, alprazolam 0.5 mg twice daily as needed  11/18/2021  appointment with the following noted: At one point since here anxiety got a lot worse but getting better now.  Some panic attacks including Saturday.   Compliant.  More work stress trying to get caught up.   Only taking alprazolam 0.5 mg HS and not in daytime.  Not sleepy with it daytime. No SE No depression or anxiety.   Plan: no med changes  04/08/22 TC:  increased anxiety and panic wanting med changes, so increase paroxetine to 60 mg daily  04/14/22 appt noted: Current meds alprazolam 0.5 mg 3 times daily as needed anxiety, buspirone 30 mg twice daily, Depakote ER 1500 mg nightly,, Olanzapine 20 mg nightly, quetiapine 25 mg nightly for sleep, paroxetine 60 mg as of 04/08/22 Anxiety and panic worse for a month and missed some work and then taken out of work by  PCP 03/30/22. Sx including panic with SOB. At least 2-3 times per day.  Worrying about everything she has to do including doctor's appts needs eye surgery.  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 Having to talk with customers all day triggers panic and can't talk during panic attacks. Even outside of work trouble getting things done Dt low energy and anxiety.  Everything closing in at one tgime and trouble breathing.  Feel overwhelmed all the time.   Also depression and has to push herself to go out of the house and low energy. Sleep is about 5-6 hours per night.   Wants to stay out of work until 12/22 to give time for doctor appts and get herself together. Current job 13 years. No med changes  05/17/22 appt noted: Anxiety is awful right now.  Hard to breath.  Heart races at times with panic.  Mind races.   2-3 panic attacks daily. SE maybe some sleepiness. Sleep about 4-5 hours.  Can't calm her mind down.   No recent counselors but has appt on 12/27 pending. Still out of work.  [place] asking about the TX plan  Plan: DC buspirone Off label clonidine and increase to 0.1 mg BID for severe anxiety and panic   06/29/2022  appointment noted: Current psychiatric medication clonidine 0.2 mg daily, Depakote ER 1500 nightly, alprazolam 0.5 mg twice daily as needed anxiety, olanzapine 20 mg nightly, paroxetine 60 mg daily, quetiapine 25 mg nightly Clonidine is the first time that seen a change with anxiety.  It immediately helps.  Checking BP and all systolic pressures above 100. Better response with BID dosing.  Less SOB.  Less irritable.   SE dry mouth, no others. Sleep is better  but still only 4-5 hours.   Depression is gradually getting better but not there yet. Back at work and clonidine helps her deal with the pressure better there.  Handling it better.  Work function is pretty good with constant volume.  Before was SOB bc couldn't relax to breathe. 2 therapy appts. Plan: Consider further increase olanzapine if necessary Continue olanzapine 20 mg 3 hours before HS. Continue Depakote ER 1500 mg HS Off label clonidine 0.1 mg BID for severe anxiety and panic has helped reduce anxiety by over 50% She didn't each or drink much today and BP borderline wihtout  SX.  Push fluids. Increased paroxetine to 60 mg daily as of 04/08/22 for worsening panic. No better so far with increased dose. BC failure of alternatives, alprazolam 0.5 mg AM before work twice daily.   08/31/22 appt noted: Stopped Depakote last vist and only alprazolam 0.5 mg HS now. MedS: In a much better place .  Still anxious but not like it used to be.  Couldn't control it before.  Less SOB. Still some panic but not as easy.  Still get triggered at work or home that realizes she should not get as upset over.  Easily overwhelmed with high demands. Sleep is better about 5-6 hours. Was having NM but less now.  Was waking H with them but not now. No SE noted.   BP usally above 100/70.  Not lightheaded or dizzy with standing. Plan: no med changes  11/01/22 apt noted: Psych meds: Alprazolam 0.5 mg 3 times daily as needed, clonidine 0.1 mg twice daily and 1  extra as needed anxiety, olanzapine 20 mg nightly, paroxetine 60 mg daily. I feel like I'm having a breakdown with anxiety.  Overwhelmed with everything with multiple stressors. Worse  about 2 weeks.  Additional stressors at work.  More mistakes at work.  Overwhelmed.  Some criticism from boss who notices the decline in function. Uncontrollable crying spells.  Tired.  More depressed and anxious.   ? Blood clot in leg.   Feeling more angry and poor conc with pressure at work.  Usually able to function at work but right now not good.  Feels like she will breakdown and lose it.  No SI but dep a lot.  Don't feel like doing as much as usual.  Feels SOB and having panic at work.   Not sleeping as much as she should but not insomnia. No SE.    PDMP only shows Xanax.  She denies abusing substances  Past Psychiatric Medication Trials: Hydroxyzine, mirtazapine poor response for sleep,   Depakote 1500,  quetiapine low-dose for sleep Olanzapine 20  duloxetine, citalopram, Wellbutrin, fluoxetine, paroxetine 60 Clonidine 0.1 BID Xanax,  Buspirone NR  Notes and chart were reviewed with the patient regarding prior history and symptoms.  Review of Systems:  Review of Systems  Constitutional:  Positive for fatigue.  Cardiovascular:  Negative for palpitations.  Gastrointestinal:  Negative for nausea.  Musculoskeletal:  Positive for myalgias.  Neurological:  Negative for dizziness, tremors and light-headedness.  Psychiatric/Behavioral:  Positive for dysphoric mood. Negative for decreased concentration and sleep disturbance. The patient is nervous/anxious.     Medications: I have reviewed the patient's current medications.  Current Outpatient Medications  Medication Sig Dispense Refill   ACCU-CHEK GUIDE test strip USE UP TO FOUR TIMES DAILY AS DIRECTED 400 strip 6   Accu-Chek Softclix Lancets lancets USE UP TO FOUR TIMES DAILY AS DIRECTED 100 each 12   ALPRAZolam (XANAX) 0.5 MG tablet TAKE 1  TABLET (0.5 MG TOTAL) BY MOUTH 3 (THREE) TIMES DAILY AS NEEDED FOR ANXIETY. 90 tablet 1   aspirin 81 MG EC tablet Take 1 tablet (81 mg total) by mouth daily. Swallow whole. 30 tablet 12   atorvastatin (LIPITOR) 80 MG tablet TAKE 1 TABLET BY MOUTH DAILY 90 tablet 3   BD PEN NEEDLE NANO U/F 32G X 4 MM MISC USE AS DIRECTED 300 each 1   blood glucose meter kit and supplies KIT Dispense based on patient and insurance preference. Use up to four times daily as directed. (FOR ICD-9 250.00, 250.01). 1 each 0   Cholecalciferol (VITAMIN D3) 75 MCG (3000 UT) TABS Take 1 tablet by mouth daily at 6 (six) AM. 30 tablet    cilostazol (PLETAL) 100 MG tablet Take 1 tablet (100 mg total) by mouth 2 (two) times daily before a meal. 60 tablet 11   cloNIDine (CATAPRES) 0.1 MG tablet 1 in the AM and 3 PM and 1 extra daily as needed for anxiety 270 tablet 0   clopidogrel (PLAVIX) 75 MG tablet TAKE 1 TABLET BY MOUTH EVERY DAY WITH BREAKFAST 90 tablet 3   Continuous Glucose Receiver (DEXCOM G6 RECEIVER) DEVI Use as directed 1 each 0   Continuous Glucose Sensor (DEXCOM G6 SENSOR) MISC USE AS DIRECTED 3 each 1   Continuous Glucose Transmitter (DEXCOM G6 TRANSMITTER) MISC USE AS DIRECTED 1 each 1   FARXIGA 10 MG TABS tablet TAKE 1 TABLET BY MOUTH DAILY BEFORE BREAKFAST 30 tablet 11   Glycerin-Hypromellose-PEG 400 (VISINE DRY EYE OP) Place 1 drop into both eyes 3 (three) times daily as needed (dry eyes).     insulin degludec (TRESIBA FLEXTOUCH) 100 UNIT/ML FlexTouch Pen Inject 12 Units into the skin daily. 3  mL 5   isosorbide mononitrate (IMDUR) 120 MG 24 hr tablet TAKE 1 TABLET BY MOUTH EVERY DAY 90 tablet 3   JANUVIA 50 MG tablet TAKE 1 TABLET BY MOUTH EVERY DAY 90 tablet 1   lisinopril (ZESTRIL) 5 MG tablet Take 1 tablet (5 mg total) by mouth daily. 90 tablet 1   metoprolol succinate (TOPROL-XL) 25 MG 24 hr tablet TAKE 1 TABLET BY MOUTH EVERY DAY *KEEP APPOINTMENT FOR REFILLS* 90 tablet 3   mupirocin ointment (BACTROBAN) 2  % Apply 1 application. topically 2 (two) times daily. 30 g 2   nitroGLYCERIN (NITROSTAT) 0.4 MG SL tablet PLACE 1 TABLET UNDER THE TONGUE EVERY 5 MINUTES AS NEEDED FOR CHEST PAIN. MAX 3. CALL 911 25 tablet 3   OLANZapine (ZYPREXA) 20 MG tablet Take 1 tablet (20 mg total) by mouth at bedtime. 90 tablet 0   ranolazine (RANEXA) 500 MG 12 hr tablet TAKE 1 TABLET BY MOUTH TWICE A DAY 180 tablet 3   XIIDRA 5 % SOLN Place 1 drop into both eyes in the morning and at bedtime.     PARoxetine (PAXIL) 40 MG tablet Take 1 tablet (40 mg total) by mouth 2 (two) times daily. As of 04/08/22 180 tablet 0   No current facility-administered medications for this visit.    Medication Side Effects: ? 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   Qualifier: Diagnosis of  By: Cheri Guppy     Diabetes mellitus type 2 in nonobese (HCC) 07/2016   Dyslipidemia    Headache(784.0)    History of pulmonary embolism 07/08/2020   HTN (hypertension)    Migraine    NSTEMI (non-ST elevated myocardial infarction) (HCC)    PAD (peripheral artery disease) (HCC)    Pulmonary embolus (HCC)    unprovoked    Family History  Problem Relation Age of Onset   Lupus Mother    Heart attack Father    Diabetes Father    Hypertension Father    Diabetes Paternal Grandmother    Hypertension Paternal Grandmother    Stroke Neg Hx    Kidney disease Neg Hx    Hyperlipidemia Neg Hx    Sudden death Neg Hx     Social History   Socioeconomic History   Marital status: Legally Separated    Spouse name: Not on file   Number of children: Not on file   Years of education: Not on file   Highest education level: Some college, no degree  Occupational  History   Occupation: Armed forces training and education officer  Tobacco Use   Smoking status: Former    Years: 25    Types: Cigarettes    Quit date: 09/12/2021    Years since quitting: 1.1   Smokeless tobacco: Never   Tobacco comments:    0.5 pack cigarettes  per month per patient   Vaping Use   Vaping Use: Never used  Substance and Sexual Activity   Alcohol use: No    Alcohol/week: 0.0 standard drinks of alcohol   Drug use: Never   Sexual activity: Yes    Partners: Male    Comment: married  Other Topics Concern   Not on file  Social History Narrative   Regular exercise:  3 days weekly   Caffeine Use:  1 soda daily   One child biological daughter born in 12 and an adopted niece.   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          Social Determinants of Health   Financial Resource Strain: Medium Risk (10/05/2022)   Overall Financial Resource Strain (CARDIA)    Difficulty of Paying Living Expenses: Somewhat hard  Food Insecurity: Food Insecurity Present (10/05/2022)   Hunger Vital Sign    Worried About Running Out of Food in the Last Year: Sometimes true    Ran Out of Food in the Last Year: Sometimes true  Transportation Needs: No Transportation Needs (10/05/2022)   PRAPARE - Administrator, Civil Service (Medical): No    Lack of Transportation (Non-Medical): No  Physical Activity: Insufficiently Active (10/05/2022)   Exercise Vital Sign    Days of Exercise per Week: 3 days    Minutes of Exercise per Session: 20 min  Stress: Stress Concern Present (10/05/2022)   [employer] of Occupational Health - Occupational Stress Questionnaire    Feeling of Stress : Rather much  Social Connections: Unknown (10/05/2022)   Social Connection and Isolation Panel [NHANES]    Frequency of Communication with Friends and Family: Twice a week    Frequency of Social Gatherings with Friends and Family: Once a week    Attends Religious Services: Patient declined    Automotive engineer or Organizations: No    Attends Engineer, structural: Not on file    Marital Status: Married  Catering manager Violence: Not on file    Past Medical History, Surgical history, Social history, and Family history were reviewed and updated as appropriate.   Please see review of systems for further details on the patient's review from today.   Objective:   Physical Exam:  LMP 11/28/2009   Physical Exam Constitutional:      General: She is not in acute distress.    Appearance: She is well-developed.  Musculoskeletal:        General: No deformity.  Neurological:     Mental Status: She is alert and oriented to person, place, and time.     Coordination: Coordination normal.  Psychiatric:        Attention and Perception: She is attentive. She does not perceive auditory hallucinations.        Mood and Affect: Mood is anxious and depressed. Affect is not labile, blunt, angry or tearful.        Speech: Speech normal. Speech is not rapid and pressured.        Behavior: Behavior normal. Behavior is not agitated.        Thought Content: Thought content normal. Thought content is not paranoid or delusional. Thought content does not include homicidal or suicidal ideation.        Cognition and Memory: Cognition normal.        Judgment: Judgment normal.     Comments: Insight intact. No auditory or visual hallucinations. No delusions.  She is worse again.     Lab Review:     Component Value Date/Time   NA 139 10/05/2022 1444   NA 141 07/23/2020 1300   K 4.4 10/05/2022 1444   CL 105 10/05/2022 1444   CO2 28 10/05/2022 1444   GLUCOSE 136 (H) 10/05/2022 1444   BUN 19 10/05/2022 1444   BUN 7  07/23/2020 1300   CREATININE 1.24 (H) 10/05/2022 1444   CREATININE 0.72 06/22/2012 0840   CALCIUM 9.3 10/05/2022 1444   PROT 6.3 10/05/2022 1444   PROT 6.6 03/29/2022 1504   ALBUMIN 4.1 10/05/2022 1444   ALBUMIN 4.6 03/29/2022 1504   AST 48 (H) 10/05/2022 1444   ALT 63  (H) 10/05/2022 1444   ALKPHOS 137 (H) 10/05/2022 1444   BILITOT 0.6 10/05/2022 1444   BILITOT 0.4 03/29/2022 1504   GFRNONAA >60 05/06/2021 0351   GFRNONAA >89 06/22/2012 0840   GFRAA 102 07/23/2020 1300   GFRAA >89 06/22/2012 0840       Component Value Date/Time   WBC 6.1 02/25/2022 0950   RBC 3.99 02/25/2022 0950   HGB 12.3 02/25/2022 0950   HCT 36.5 02/25/2022 0950   PLT 187.0 02/25/2022 0950   MCV 91.4 02/25/2022 0950   MCH 29.6 05/07/2021 0149   MCHC 33.8 02/25/2022 0950   RDW 14.4 02/25/2022 0950   LYMPHSABS 2.2 02/25/2022 0950   MONOABS 0.4 02/25/2022 0950   EOSABS 0.3 02/25/2022 0950   BASOSABS 0.0 02/25/2022 0950    No results found for: "POCLITH", "LITHIUM"   No results found for: "PHENYTOIN", "PHENOBARB", "VALPROATE", "CBMZ"   .res Assessment: Plan:    Wykisha was seen today for follow-up, anxiety and depression.  Diagnoses and all orders for this visit:  Panic disorder with agoraphobia -     PARoxetine (PAXIL) 40 MG tablet; Take 1 tablet (40 mg total) by mouth 2 (two) times daily. As of 04/08/22  Generalized anxiety disorder -     PARoxetine (PAXIL) 40 MG tablet; Take 1 tablet (40 mg total) by mouth 2 (two) times daily. As of 04/08/22  Moderate mixed bipolar I disorder (HCC)  Insomnia due to mental condition  Chronic work stress  Greater than 50% of 30 min face to face time with patient was spent on counseling and coordination of care. We discussed the following: she and her husband have both noted significant improvement and her level of anxiety and she is less depressed with the addition of olanzapine and the increased to 20 mg daily.  It is also a mood stabilizer.  It also can help with depression when added to an SSRI.  We discussed side effects in detail.  This is the usual max dose.  Repeated bouts of STD DT anxiety and depression. However she is more anxious and depressed with panic lately affecting work.    Continue olanzapine 20 mg 3 hours  before HS. Stoppped Depakote ER without difficulty.  Consider option of Abilify aug but might not sleep if olanazaine soppted.  Off label clonidine 0.1 mg BID for severe anxiety and panic has helped reduce anxiety by over 50% Can't increase clonidine bc BP borderline wihtout  SX.  Push fluids.  paroxetine to 60 mg daily as of 04/08/22 for worsening panic helped until lately.  Disc risk high dose Increase paroxetine off label DT TRD and TR anxiety to 80 mg daily.  BC failure of alternatives, alprazolam 0.5 mg prn.  Alternatives clonazepam, Ativan  Discussed potential metabolic side effects associated with atypical antipsychotics, as well as potential risk for movement side effects. Advised pt to contact office if movement side effects occur.  Metabolic side effects are unlikely at this low dose of quetiapine and almost no risk of EPS at this low-dose. DM managed  We discussed the short-term risks associated with benzodiazepines including sedation and increased fall risk among others.  Discussed long-term side  effect risk including dependence, potential withdrawal symptoms, and the potential eventual dose-related risk of dementia.  But recent studies from 2020 dispute this association between benzodiazepines and dementia risk. Newer studies in 2020 do not support an association with dementia. If too sleepy with it call and will call if needed to switch to lorazepam.  continue counseling refer to [facility] 16 min: on RTW fears. Anxiety and managing panic and dpression in the work place.  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  Disc relaxation, social and other behavioral strategies to manage.  OK FMLA .  Out of work until July 9 when has FU appt.  This appt was 30 mins.   Follow-up 1 mos  Meredith Staggers MD, DFAPA  Please see After Visit Summary for patient specific instructions.   Future Appointments  Date Time Provider Department Center   11/02/2022  9:20 AM Leonie Douglas, MD [facility]  11/05/2022  1:00 PM Deneise Lever, LMFT [facility] None  12/06/2022  4:00 PM Cottle, Steva Ready., MD [facility] None  01/04/2023  1:00 PM Sandford Craze, NP [facility]    No orders of the defined types were placed in this encounter.      -------------------------------

## 2022-11-02 ENCOUNTER — Ambulatory Visit (INDEPENDENT_AMBULATORY_CARE_PROVIDER_SITE_OTHER): Payer: Self-pay | Admitting: Vascular Surgery

## 2022-11-02 DIAGNOSIS — I70213 Atherosclerosis of native arteries of extremities with intermittent claudication, bilateral legs: Secondary | ICD-10-CM

## 2022-11-03 ENCOUNTER — Telehealth: Payer: Self-pay | Admitting: Psychiatry

## 2022-11-03 NOTE — Telephone Encounter (Signed)
Received STD paperwork. Given to [facility] 6/5

## 2022-11-04 ENCOUNTER — Telehealth: Payer: Self-pay

## 2022-11-04 ENCOUNTER — Telehealth: Payer: Self-pay | Admitting: *Deleted

## 2022-11-04 NOTE — Telephone Encounter (Signed)
   Name: [name]  DOB: [birth year]  MRN: [id number]  Primary Cardiologist: Rollene Rotunda, MD  Chart reviewed as part of pre-operative protocol coverage. Because of Bailey Greene past medical history and time since last visit, she will require a follow-up in-office visit in order to better assess preoperative cardiovascular risk.  Pre-op covering staff: - Please schedule appointment and call patient to inform them. If patient already had an upcoming appointment within acceptable timeframe, please add "pre-op clearance" to the appointment notes so provider is aware. - Please contact requesting surgeon's office via preferred method (i.e, phone, fax) to inform them of need for appointment prior to surgery.  Plavix hold can be determined at the time of in person appointment.  Patient with current symptoms of chest pain.  Sharlene Dory, PA-C  11/04/2022, 4:30 PM

## 2022-11-04 NOTE — Telephone Encounter (Signed)
   Pre-operative Risk Assessment    Patient Name: Chabeli Zeolla  DOB: 1961-01-28 MRN: [id number]      Request for Surgical Clearance    Procedure:   RIGHT COMMON FEMORAL ENDARTERECTOMY  WITH B/L COMMON ILIAC ARTERY STENTING   Date of Surgery:  Clearance TBD                                 Surgeon:  DR. Heath Lark Surgeon's Group or Practice Name:  [facility] Phone number:  [phone] Fax number:  [phone]   Type of Clearance Requested:   - Medical  - Pharmacy:  Hold Clopidogrel (Plavix) x 5 DAYS PRIOR; PER DR. HAWKEN PT CAN CONTINUE  ASA   Type of Anesthesia:  General    Additional requests/questions:    Elpidio Anis   11/04/2022, 3:56 PM

## 2022-11-04 NOTE — Telephone Encounter (Signed)
Patient made aware cardiac clearance needed prior to scheduling surgery d/t noted recent episode of chest pain in May. Patient verbalized understanding. Faxed clearance request to Dr. Jenene Slicker office.

## 2022-11-05 ENCOUNTER — Ambulatory Visit (INDEPENDENT_AMBULATORY_CARE_PROVIDER_SITE_OTHER): Payer: 59 | Admitting: Behavioral Health

## 2022-11-05 ENCOUNTER — Telehealth: Payer: Self-pay

## 2022-11-05 DIAGNOSIS — F3162 Bipolar disorder, current episode mixed, moderate: Secondary | ICD-10-CM | POA: Diagnosis not present

## 2022-11-05 DIAGNOSIS — F4001 Agoraphobia with panic disorder: Secondary | ICD-10-CM

## 2022-11-05 NOTE — Progress Notes (Signed)
                Shaquanna Lycan L Consuello Lassalle, LMFT 

## 2022-11-05 NOTE — Telephone Encounter (Signed)
Pt contacted and tele scheduled 6/19 at 1020am. Med rec and consent done

## 2022-11-05 NOTE — Telephone Encounter (Signed)
Pt contacted and tele scheduled 6/19 at 1020am. Med rec and consent done    Patient Consent for Virtual Visit        Bailey Greene has provided verbal consent on 11/05/2022 for a virtual visit (video or telephone).   CONSENT FOR VIRTUAL VISIT FOR:  Bailey Greene  By participating in this virtual visit I agree to the following:  I hereby voluntarily request, consent and authorize [facility] and its employed or contracted physicians, physician assistants, nurse practitioners or other licensed health care professionals (the Practitioner), to provide me with telemedicine health care services (the "Services") as deemed necessary by the treating Practitioner. I acknowledge and consent to receive the Services by the Practitioner via telemedicine. I understand that the telemedicine visit will involve communicating with the Practitioner through live audiovisual communication technology and the disclosure of certain medical information by electronic transmission. I acknowledge that I have been given the opportunity to request an in-person assessment or other available alternative prior to the telemedicine visit and am voluntarily participating in the telemedicine visit.  I understand that I have the right to withhold or withdraw my consent to the use of telemedicine in the course of my care at any time, without affecting my right to future care or treatment, and that the Practitioner or I may terminate the telemedicine visit at any time. I understand that I have the right to inspect all information obtained and/or recorded in the course of the telemedicine visit and may receive copies of available information for a reasonable fee.  I understand that some of the potential risks of receiving the Services via telemedicine include:  Delay or interruption in medical evaluation due to technological equipment failure or disruption; Information transmitted may not be sufficient (e.g. poor resolution  of images) to allow for appropriate medical decision making by the Practitioner; and/or  In rare instances, security protocols could fail, causing a breach of personal health information.  Furthermore, I acknowledge that it is my responsibility to provide information about my medical history, conditions and care that is complete and accurate to the best of my ability. I acknowledge that Practitioner's advice, recommendations, and/or decision may be based on factors not within their control, such as incomplete or inaccurate data provided by me or distortions of diagnostic images or specimens that may result from electronic transmissions. I understand that the practice of medicine is not an exact science and that Practitioner makes no warranties or guarantees regarding treatment outcomes. I acknowledge that a copy of this consent can be made available to me via my patient portal [facility] MyChart), or I can request a printed copy by calling the office of [facility].    I understand that my insurance will be billed for this visit.   I have read or had this consent read to me. I understand the contents of this consent, which adequately explains the benefits and risks of the Services being provided via telemedicine.  I have been provided ample opportunity to ask questions regarding this consent and the Services and have had my questions answered to my satisfaction. I give my informed consent for the services to be provided through the use of telemedicine in my medical care

## 2022-11-05 NOTE — Progress Notes (Signed)
[facility] Counselor/Therapist Progress Note  Patient ID: Bailey Greene, MRN: [id number],    Date: 11/05/2022  Time Spent: 55 min Caregility video; Pt is home in private & Provider working remotely from [employer]   Treatment Type: Individual Therapy  Reported Symptoms: Elevated anx/dep & stress due to FMLA & current health status changes  Mental Status Exam: Appearance:  Casual     Behavior: Appropriate and Sharing  Motor: Normal  Speech/Language:  Clear and Coherent  Affect: Appropriate  Mood: normal  Thought process: normal  Thought content:   WNL  Sensory/Perceptual disturbances:   WNL  Orientation: oriented to person, place, and time/date  Attention: Good  Concentration: Good  Memory: WNL  Fund of knowledge:  Good  Insight:   Good  Judgment:  Good  Impulse Control: Good   Risk Assessment: Danger to Self:  No Self-injurious Behavior: No Danger to Others: No Duty to Warn:no Physical Aggression / Violence:No  Access to Firearms a concern: No  Gang Involvement:No   Subjective: Pt is exp'g episodes of high anxiety in which she sweats, feels light-headedness, SOB, & weakness. Husb has improved his support by assisting Pt w/chores & shopping. She has been on ST Disability since June 3rd. She is written out until July 9th. She is feeling overwhelmed @ work.    Interventions: Solution-Oriented/Positive Psychology  Diagnosis:Moderate mixed bipolar I disorder (HCC)  Panic disorder with agoraphobia  Plan: Annice Pih will explore options @ [employer] that may improve her attitude & performance.   Target Date: 11/28/2022  Progress: 4  Frequency: Once every 3-4 wks  Modality: [place] will keep her health as a priority & take the time she needs to care for herself. She will take the time off she needs to care for her health.  Target Date: 11/28/2022  Progress: 5  Frequency: Once every 3-4 wks  Modality: Claretta Fraise, LMFT

## 2022-11-09 ENCOUNTER — Ambulatory Visit: Payer: 59 | Admitting: Vascular Surgery

## 2022-11-09 ENCOUNTER — Encounter (HOSPITAL_COMMUNITY): Payer: 59

## 2022-11-12 DIAGNOSIS — Z0289 Encounter for other administrative examinations: Secondary | ICD-10-CM

## 2022-11-12 NOTE — Telephone Encounter (Signed)
Paper work completed and will have Dr. Jennelle Human sign and get faxed with last office note

## 2022-11-15 NOTE — Telephone Encounter (Signed)
Faxed last week, 11/12/2022

## 2022-11-16 ENCOUNTER — Other Ambulatory Visit: Payer: Self-pay | Admitting: Psychiatry

## 2022-11-16 DIAGNOSIS — F4001 Agoraphobia with panic disorder: Secondary | ICD-10-CM

## 2022-11-17 ENCOUNTER — Ambulatory Visit: Payer: 59

## 2022-11-17 NOTE — Telephone Encounter (Signed)
Estellar called and LM yesterday at 4:45 reporting that Bailey Greene had called her and said the paperwork was incomplete.  She is wanting to know what was sent.  I have not called her back.  Do know what may be incomplete?  I'll be happy to call her back if you give me an update.

## 2022-11-18 NOTE — Telephone Encounter (Signed)
She was contacted and reports everything has been received

## 2022-11-19 NOTE — Progress Notes (Unsigned)
Cardiology Clinic Note   Patient Name: Bailey Greene Date of Encounter: 11/22/2022  Primary Care Provider:  Sandford Craze, NP Primary Cardiologist:  Rollene Rotunda, MD  Patient Profile    62 year old female with history of oronary artery disease status post CABG x5 in 2006 followup cath in 2007 showed patent grafts and improved LV function EF 65%. She was in the hospital 05/05/2021 with NSTEMI with troponin peaking at 3359.  Cardiac catheterization showed severe multivessel native CAD, 3 out of 5 grafts patent (LIMA-LAD, free RIMA-diagonal, left radial-RPDA) culprit lesion felt to be 100% occluded SVG-OM-OM2 with patent OM1-OM2 limb.    Past Medical History    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   Qualifier: Diagnosis of  By: Cheri Guppy     Diabetes mellitus type 2 in nonobese (HCC) 07/2016   Dyslipidemia    Headache(784.0)    History of pulmonary embolism 07/08/2020   HTN (hypertension)    Migraine    NSTEMI (non-ST elevated myocardial infarction) (HCC)    PAD (peripheral artery disease) (HCC)    Pulmonary embolus (HCC)    unprovoked   Past Surgical History:  Procedure Laterality Date   BUNIONECTOMY  08/2011   right foot   CARDIAC CATHETERIZATION  2007   severe native 3 v dz, all grafts patent (LIMA-LAD, RIMA-Diag, SVG-OM1-OM2, R radial-PDA)   CESAREAN SECTION  1984   CORONARY ARTERY BYPASS GRAFT  2006    Coronary artery bypass grafting x5 with a left  internal  mammary to the left anterior descending coronary artery.  Free right  internal mammary to the diagonal coronary artery, sequential reverse  saphenous vein graft to the first and second obtuse marginal, right  artery bypass to the posterior descending coronary artery with endo-vein harvesting.   LEFT HEART CATH AND CORS/GRAFTS  ANGIOGRAPHY N/A 05/05/2021   Procedure: LEFT HEART CATH AND CORS/GRAFTS ANGIOGRAPHY;  Surgeon: Marykay Lex, MD;  Location: [facility] INVASIVE CV LAB;  Service: Cardiovascular;  Laterality: N/A;   UMBILICAL HERNIA REPAIR N/A 01/16/2021   Procedure: UMBILICAL  HERNIA REPAIR;  Surgeon: Fritzi Mandes, MD;  Location: [facility] OR;  Service: General;  Laterality: N/A;    Allergies  Allergies  Allergen Reactions   Metformin And Related Nausea And Vomiting    History of Present Illness    Bailey Greene presents today for pre-operative cardiac clearance for RIGHT COMMON FEMORAL ENDARTERECTOMY  WITH B/L COMMON ILIAC ARTERY STENTING by Dr. Heath Lark on date to be determined. Will need recommendations on Plavix cessation.    She comes today without complaints of chest pain, her main complaint is bilateral leg pain especially on the left severely decreasing her ability to walk for any length of time (less than 10 minutes without having to stop), she denies any dizziness, but does have some generalized fatigue.  She stopped smoking 2 months ago.  She has been medically compliant.  She states she is very anxious about the upcoming procedure and is ready for this to be taken care of so she can go back to walking as she has enjoyed in the past.  Home Medications    Current Outpatient Medications  Medication Sig Dispense Refill   ACCU-CHEK GUIDE test strip USE UP TO FOUR TIMES DAILY AS DIRECTED 400 strip 6   Accu-Chek Softclix  Lancets lancets USE UP TO FOUR TIMES DAILY AS DIRECTED 100 each 12   ALPRAZolam (XANAX) 0.5 MG tablet TAKE 1 TABLET (0.5 MG TOTAL) BY MOUTH 3 (THREE) TIMES DAILY AS NEEDED FOR ANXIETY. 90 tablet 0   aspirin 81 MG EC tablet Take 1 tablet (81 mg total) by mouth daily. Swallow whole. 30 tablet 12   atorvastatin (LIPITOR) 80 MG tablet TAKE 1 TABLET BY MOUTH DAILY 90 tablet 3   BD PEN NEEDLE NANO U/F 32G X 4 MM MISC USE AS DIRECTED 300 each 1   blood glucose meter kit and supplies KIT Dispense  based on patient and insurance preference. Use up to four times daily as directed. (FOR ICD-9 250.00, 250.01). 1 each 0   busPIRone (BUSPAR) 30 MG tablet Take 30 mg by mouth.     Cholecalciferol (VITAMIN D3) 75 MCG (3000 UT) TABS Take 1 tablet by mouth daily at 6 (six) AM. 30 tablet    cilostazol (PLETAL) 100 MG tablet Take 1 tablet (100 mg total) by mouth 2 (two) times daily before a meal. 60 tablet 11   cloNIDine (CATAPRES) 0.1 MG tablet 1 in the AM and 3 PM and 1 extra daily as needed for anxiety 270 tablet 0   clopidogrel (PLAVIX) 75 MG tablet TAKE 1 TABLET BY MOUTH EVERY DAY WITH BREAKFAST 90 tablet 3   Continuous Glucose Receiver (DEXCOM G6 RECEIVER) DEVI Use as directed 1 each 0   Continuous Glucose Sensor (DEXCOM G6 SENSOR) MISC USE AS DIRECTED 3 each 1   Continuous Glucose Transmitter (DEXCOM G6 TRANSMITTER) MISC USE AS DIRECTED 1 each 1   Ergocalciferol (VITAMIN D2 PO) TAKE 1 CAPSULE BY MOUTH ONE TIME PER WEEK     FARXIGA 10 MG TABS tablet TAKE 1 TABLET BY MOUTH DAILY BEFORE BREAKFAST 30 tablet 11   Glycerin-Hypromellose-PEG 400 (VISINE DRY EYE OP) Place 1 drop into both eyes 3 (three) times daily as needed (dry eyes).     insulin degludec (TRESIBA FLEXTOUCH) 100 UNIT/ML FlexTouch Pen Inject 12 Units into the skin daily. 3 mL 5   isosorbide mononitrate (IMDUR) 120 MG 24 hr tablet TAKE 1 TABLET BY MOUTH EVERY DAY 90 tablet 3   JANUVIA 50 MG tablet TAKE 1 TABLET BY MOUTH EVERY DAY 90 tablet 1   lisinopril (ZESTRIL) 5 MG tablet Take 1 tablet (5 mg total) by mouth daily. 90 tablet 1   metoprolol succinate (TOPROL-XL) 25 MG 24 hr tablet TAKE 1 TABLET BY MOUTH EVERY DAY *KEEP APPOINTMENT FOR REFILLS* 90 tablet 3   mupirocin ointment (BACTROBAN) 2 % Apply 1 application. topically 2 (two) times daily. 30 g 2   nitroGLYCERIN (NITROSTAT) 0.4 MG SL tablet PLACE 1 TABLET UNDER THE TONGUE EVERY 5 MINUTES AS NEEDED FOR CHEST PAIN. MAX 3. CALL 911 25 tablet 3   OLANZapine (ZYPREXA) 20 MG tablet Take 1  tablet (20 mg total) by mouth at bedtime. 90 tablet 0   PARoxetine (PAXIL) 40 MG tablet Take 1 tablet (40 mg total) by mouth 2 (two) times daily. As of 04/08/22 180 tablet 0   ranolazine (RANEXA) 500 MG 12 hr tablet TAKE 1 TABLET BY MOUTH TWICE A DAY 180 tablet 3   XIIDRA 5 % SOLN Place 1 drop into both eyes in the morning and at bedtime.     No current facility-administered medications for this visit.     Family History    Family History  Problem Relation Age of Onset   Lupus Mother  Heart attack Father    Diabetes Father    Hypertension Father    Diabetes Paternal Grandmother    Hypertension Paternal Grandmother    Stroke Neg Hx    Kidney disease Neg Hx    Hyperlipidemia Neg Hx    Sudden death Neg Hx    She indicated that her mother is deceased. She indicated that her father is deceased. She indicated that the status of her paternal grandmother is unknown. She indicated that the status of her neg hx is unknown.  Social History    Social History   Socioeconomic History   Marital status: Legally Separated    Spouse name: Not on file   Number of children: Not on file   Years of education: Not on file   Highest education level: Some college, no degree  Occupational History   Occupation: Armed forces training and education officer  Tobacco Use   Smoking status: Former    Years: 25    Types: Cigarettes    Quit date: 09/12/2021    Years since quitting: 1.1   Smokeless tobacco: Never   Tobacco comments:    0.5 pack cigarettes  per month per patient   Vaping Use   Vaping Use: Never used  Substance and Sexual Activity   Alcohol use: No    Alcohol/week: 0.0 standard drinks of alcohol   Drug use: Never   Sexual activity: Yes    Partners: Male    Comment: married  Other Topics Concern   Not on file  Social History Narrative   Regular exercise:  3 days weekly   Caffeine Use:  1 soda daily   One child biological daughter born in 47 and an adopted niece.   [employer]- Environmental education officer   Married- may be divorcing.          Social Determinants of Health   Financial Resource Strain: Medium Risk (10/05/2022)   Overall Financial Resource Strain (CARDIA)    Difficulty of Paying Living Expenses: Somewhat hard  Food Insecurity: Food Insecurity Present (10/05/2022)   Hunger Vital Sign    Worried About Running Out of Food in the Last Year: Sometimes true    Ran Out of Food in the Last Year: Sometimes true  Transportation Needs: No Transportation Needs (10/05/2022)   PRAPARE - Administrator, Civil Service (Medical): No    Lack of Transportation (Non-Medical): No  Physical Activity: Insufficiently Active (10/05/2022)   Exercise Vital Sign    Days of Exercise per Week: 3 days    Minutes of Exercise per Session: 20 min  Stress: Stress Concern Present (10/05/2022)   [employer] of Occupational Health - Occupational Stress Questionnaire    Feeling of Stress : Rather much  Social Connections: Unknown (10/05/2022)   Social Connection and Isolation Panel [NHANES]    Frequency of Communication with Friends and Family: Twice a week    Frequency of Social Gatherings with Friends and Family: Once a week    Attends Religious Services: Patient declined    Database administrator or Organizations: No    Attends Engineer, structural: Not on file    Marital Status: Married  Catering manager Violence: Not on file     Review of Systems    General:  No chills, fever, night sweats or weight changes.  Cardiovascular:  No chest pain, dyspnea on exertion, edema, orthopnea, palpitations, paroxysmal nocturnal dyspnea. Dermatological: No rash, lesions/masses Respiratory: No cough, dyspnea Urologic: No hematuria, dysuria Abdominal:  No nausea, vomiting, diarrhea, bright red blood per rectum, melena, or hematemesis Neurologic:  No visual changes, wkns, changes in mental status. All other systems reviewed and are otherwise negative except as noted above.      Physical Exam    VS:  BP 122/68 (BP Location: Left Arm, Patient Position: Sitting, Cuff Size: Normal)   Pulse 67   Ht 5\' 5"  (1.651 m)   Wt 124 lb 12.8 oz (56.6 kg)   LMP 11/28/2009   BMI 20.77 kg/m  , BMI Body mass index is 20.77 kg/m.     GEN: Well nourished, well developed, in no acute distress. HEENT: normal. Neck: Supple, no JVD, carotid bruits, or masses. Cardiac: RRR, no murmurs, rubs, or gallops. No clubbing, cyanosis, edema.  Radials/DP/PT diminished bilaterally +1 on the right . Respiratory:  Respirations regular and unlabored, clear to auscultation bilaterally. GI: Soft, nontender, nondistended, BS + x 4. MS: no deformity or atrophy. Skin: warm and dry, no rash. Neuro:  Strength and sensation are intact. Psych: Normal affect.  Accessory Clinical Findings     EKG: (Personally reviewed) normal sinus rhythm, heart rate 67 bpm with left atrial enlargement, evidence of septal infarct noted initially on December 2022, with no acute changes. Lab Results  Component Value Date   WBC 6.1 02/25/2022   HGB 12.3 02/25/2022   HCT 36.5 02/25/2022   MCV 91.4 02/25/2022   PLT 187.0 02/25/2022   Lab Results  Component Value Date   CREATININE 1.24 (H) 10/05/2022   BUN 19 10/05/2022   NA 139 10/05/2022   K 4.4 10/05/2022   CL 105 10/05/2022   CO2 28 10/05/2022   Lab Results  Component Value Date   ALT 63 (H) 10/05/2022   AST 48 (H) 10/05/2022   ALKPHOS 137 (H) 10/05/2022   BILITOT 0.6 10/05/2022   Lab Results  Component Value Date   CHOL 130 10/05/2022   HDL 47.70 10/05/2022   LDLCALC 70 10/05/2022   LDLDIRECT 145.2 07/18/2008   TRIG 64.0 10/05/2022   CHOLHDL 3 10/05/2022    Lab Results  Component Value Date   HGBA1C 6.5 10/05/2022    Review of Prior Studies    Cath 04/2021   Diagnostic Dominance: Co-domiant   Assessment & Plan   1.  Preoperative cardiac evaluation:    Chart reviewed as part of pre-operative protocol coverage. Given past medical  history and time since last visit, based on ACC/AHA guidelines, Jesseka Drinkard would be at acceptable risk for the planned procedure without further cardiovascular testing.   Please hold Plavix 5 days prior to procedure and begin again as soon as hemodynamically stable at the discretion of the surgeon.  Hold aspirin 72 hours prior to procedure and begin again at the discretion of the surgeon.  Hold Farxiga 24 hours prior to procedure and begin again at the discretion of the surgeon.  2.  Coronary artery disease: History of coronary artery bypass grafting x 5 in 2006.  Most recent cardiac catheterization 2017, revealed 3 out of 5 grafts patent, with occluded SVG to OM 2 and patent OM1 to OM 2 limb.  She is completely asymptomatic other than the fact that she is having complaints of leg pain with walking.  No chest pain, or shortness of breath associated with any activity.  Continue aggressive secondary management.  3.  Hypertension: Excellent control of blood pressure today despite her complaints of feeling anxious.  No changes in her regimen continue metoprolol, lisinopril, clonidine.  4.  Hypercholesterolemia: Goal of LDL less than 70.  Remains on atorvastatin 80 mg daily.  Most recent lipid profile on 10/05/2022 total cholesterol 130, 70, HDL 47.  5.  PAD: Iliac artery disease per CT angiogram followed by Dr. [name].  Planned right femoral endarterectomy at date to be determined       Signed, Bettey Mare. Liborio Nixon, ANP, AACC   11/22/2022 10:02 AM      Office [phone] Fax [phone]  Notice: This dictation was prepared with Dragon dictation along with smaller phrase technology. Any transcriptional errors that result from this process are unintentional and may not be corrected upon review.

## 2022-11-22 ENCOUNTER — Ambulatory Visit: Payer: 59 | Attending: Adult Health | Admitting: Adult Health

## 2022-11-22 ENCOUNTER — Encounter: Payer: Self-pay | Admitting: Adult Health

## 2022-11-22 VITALS — BP 122/68 | HR 67 | Ht 65.0 in | Wt 124.8 lb

## 2022-11-22 DIAGNOSIS — I739 Peripheral vascular disease, unspecified: Secondary | ICD-10-CM

## 2022-11-22 DIAGNOSIS — Z7984 Long term (current) use of oral hypoglycemic drugs: Secondary | ICD-10-CM

## 2022-11-22 DIAGNOSIS — E119 Type 2 diabetes mellitus without complications: Secondary | ICD-10-CM

## 2022-11-22 DIAGNOSIS — E7800 Pure hypercholesterolemia, unspecified: Secondary | ICD-10-CM

## 2022-11-22 DIAGNOSIS — I10 Essential (primary) hypertension: Secondary | ICD-10-CM

## 2022-11-22 DIAGNOSIS — I2510 Atherosclerotic heart disease of native coronary artery without angina pectoris: Secondary | ICD-10-CM

## 2022-11-22 DIAGNOSIS — Z01810 Encounter for preprocedural cardiovascular examination: Secondary | ICD-10-CM

## 2022-11-22 NOTE — Patient Instructions (Signed)
Medication Instructions:  No Changes *If you need a refill on your cardiac medications before your next appointment, please call your pharmacy*   Lab Work: No Labs If you have labs (blood work) drawn today and your tests are completely normal, you will receive your results only by: MyChart Message (if you have MyChart) OR A paper copy in the mail If you have any lab test that is abnormal or we need to change your treatment, we will call you to review the results.   Testing/Procedures: No Testing   Follow-Up: At [facility], you and your health needs are our priority.  As part of our continuing mission to provide you with exceptional heart care, we have created designated Provider Care Teams.  These Care Teams include your primary Cardiologist (physician) and Advanced Practice Providers (APPs -  Physician Assistants and Nurse Practitioners) who all work together to provide you with the care you need, when you need it.  We recommend signing up for the patient portal called "MyChart".  Sign up information is provided on this After Visit Summary.  MyChart is used to connect with patients for Virtual Visits (Telemedicine).  Patients are able to view lab/test results, encounter notes, upcoming appointments, etc.  Non-urgent messages can be sent to your provider as well.   To learn more about what you can do with MyChart, go to [email or web address].    Your next appointment:   1 month(s)  Provider:   Joni Reining, DNP, ANP  or, Rollene Rotunda, MD   Other Instructions:  Hold the following Medications Prior to Procedure;  Plavix 5 days prior . Farxiga 24 hours prior. Aspirin 72 hours prior.

## 2022-11-23 ENCOUNTER — Other Ambulatory Visit: Payer: Self-pay | Admitting: Cardiovascular Disease

## 2022-11-23 ENCOUNTER — Telehealth: Payer: Self-pay | Admitting: Cardiology

## 2022-11-23 ENCOUNTER — Other Ambulatory Visit: Payer: Self-pay | Admitting: Cardiology

## 2022-11-23 ENCOUNTER — Other Ambulatory Visit: Payer: Self-pay | Admitting: Family

## 2022-11-23 NOTE — Telephone Encounter (Signed)
Returned patient call who wanted to know when her procedure would be scheduled with [facility]. I informed her that she would need to call their office to get the information, Patient verbalized understanding and thanked me for the call.

## 2022-11-23 NOTE — Telephone Encounter (Signed)
See previous encounter.  Patient would like to  know if there are any specific instructions she needs to follow for her upcoming procedure. Please advise.

## 2022-11-24 ENCOUNTER — Other Ambulatory Visit: Payer: Self-pay

## 2022-11-24 DIAGNOSIS — I70213 Atherosclerosis of native arteries of extremities with intermittent claudication, bilateral legs: Secondary | ICD-10-CM

## 2022-11-30 ENCOUNTER — Encounter (HOSPITAL_COMMUNITY): Payer: Self-pay

## 2022-11-30 ENCOUNTER — Other Ambulatory Visit: Payer: Self-pay

## 2022-11-30 ENCOUNTER — Encounter (HOSPITAL_COMMUNITY)
Admission: RE | Admit: 2022-11-30 | Discharge: 2022-11-30 | Disposition: A | Discharge status: Home / Self Care | Payer: 59 | Source: Ambulatory Visit | Attending: Vascular Surgery | Admitting: Vascular Surgery

## 2022-11-30 VITALS — BP 105/60 | HR 78 | Temp 98.0°F | Resp 16 | Ht 65.0 in | Wt 130.0 lb

## 2022-11-30 DIAGNOSIS — I2510 Atherosclerotic heart disease of native coronary artery without angina pectoris: Secondary | ICD-10-CM | POA: Diagnosis present

## 2022-11-30 DIAGNOSIS — Z01818 Encounter for other preprocedural examination: Secondary | ICD-10-CM | POA: Diagnosis not present

## 2022-11-30 DIAGNOSIS — I70213 Atherosclerosis of native arteries of extremities with intermittent claudication, bilateral legs: Secondary | ICD-10-CM

## 2022-11-30 LAB — TYPE AND SCREEN
ABO/RH(D): O POS
Antibody Screen: NEGATIVE

## 2022-11-30 LAB — URINALYSIS, ROUTINE W REFLEX MICROSCOPIC
Bacteria, UA: NONE SEEN
Bilirubin Urine: NEGATIVE
Glucose, UA: >=500 mg/dL — AB
Hgb urine dipstick: NEGATIVE
Ketones, ur: NEGATIVE mg/dL
Leukocytes,Ua: NEGATIVE
Nitrite: NEGATIVE
Protein, ur: NEGATIVE mg/dL
Specific Gravity, Urine: 1.01 (ref 1.005–1.030)
pH: 5 (ref 5.0–8.0)

## 2022-11-30 LAB — COMPREHENSIVE METABOLIC PANEL
ALT: 18 U/L (ref 0–44)
AST: 17 U/L (ref 15–41)
Albumin: 3.9 g/dL (ref 3.5–5.0)
Alkaline Phosphatase: 95 U/L (ref 38–126)
Anion gap: 6 (ref 5–15)
BUN: 24 mg/dL — ABNORMAL HIGH (ref 6–20)
CO2: 25 mmol/L (ref 22–32)
Calcium: 9.4 mg/dL (ref 8.9–10.3)
Chloride: 106 mmol/L (ref 98–111)
Creatinine, Ser: 1.36 mg/dL — ABNORMAL HIGH (ref 0.44–1.00)
GFR, Estimated: 45 mL/min — ABNORMAL LOW (ref >60)
Glucose, Bld: 99 mg/dL (ref 70–99)
Potassium: 4.3 mmol/L (ref 3.5–5.1)
Sodium: 137 mmol/L (ref 135–145)
Total Bilirubin: 0.7 mg/dL (ref 0.3–1.2)
Total Protein: 6.9 g/dL (ref 6.5–8.1)

## 2022-11-30 LAB — SURGICAL PCR SCREEN
MRSA, PCR: NEGATIVE
Staphylococcus aureus: POSITIVE — AB

## 2022-11-30 LAB — CBC
HCT: 34.9 % — ABNORMAL LOW (ref 36.0–46.0)
Hemoglobin: 11.5 g/dL — ABNORMAL LOW (ref 12.0–15.0)
MCH: 29.7 pg (ref 26.0–34.0)
MCHC: 33 g/dL (ref 30.0–36.0)
MCV: 90.2 fL (ref 80.0–100.0)
Platelets: 164 10*3/uL (ref 150–400)
RBC: 3.87 MIL/uL (ref 3.87–5.11)
RDW: 13.9 % (ref 11.5–15.5)
WBC: 7.8 10*3/uL (ref 4.0–10.5)
nRBC: 0 % (ref 0.0–0.2)

## 2022-11-30 LAB — PROTIME-INR
INR: 1 (ref 0.8–1.2)
Prothrombin Time: 13.4 seconds (ref 11.4–15.2)

## 2022-11-30 LAB — GLUCOSE, CAPILLARY: Glucose-Capillary: 104 mg/dL — ABNORMAL HIGH (ref 70–99)

## 2022-11-30 LAB — APTT: aPTT: 31 seconds (ref 24–36)

## 2022-11-30 NOTE — Progress Notes (Deleted)
Surgical Instructions    Your procedure is scheduled on Wednesday July 10.  Report to [facility] Main Entrance "A" at 9:10 A.M., then check in with the Admitting office.  Call this number if you have problems the morning of surgery:  [phone]   If you have any questions prior to your surgery date call [phone]: Open Monday-Friday 8am-4pm If you experience any cold or flu symptoms such as cough, fever, chills, shortness of breath, etc. between now and your scheduled surgery, please notify us at the above number     Remember:  Do not eat or drink anything after midnight the night before your surgery    Take these medicines the morning of surgery with A SIP OF WATER:  aspirin 81 MG EC  atorvastatin (LIPITOR)  busPIRone (BUSPAR) cilostazol (PLETAL)  isosorbide mononitrate (IMDUR) metoprolol succinate (TOPROL-XL)  PARoxetine (PAXIL)  ranolazine (RANEXA)  XIIDRA 5 % SOLN EYE DROPS  If needed take: ALPRAZolam Prudy Feeler)  cloNIDine (CATAPRES)  nitroGLYCERIN (NITROSTAT)   As of today, STOP taking any  Aleve, Naproxen, Ibuprofen, Motrin, Advil, Goody's, BC's, all herbal medications, fish oil, and all vitamins.  HOLD PLAVIX 5 DAYS PRIOR TO SURGERY.  LAST DOSE WILL BE December 02, 2022.   WHAT DO I DO ABOUT MY DIABETES MEDICATION?  Do not take oral diabetes medicines (pills) the morning of surgery.  DO NOT take JANUVIA the morning of surgery.   HOLD FARXIGA    hours before surgery.  Last dose will be   THE NIGHT BEFORE SURGERY, take 6 units of insulin degludec (TRESIBA FLEXTOUCH) insulin.       THE MORNING OF SURGERY, take 6 units of insulin degludec (TRESIBA FLEXTOUCH) insulin.   HOW TO MANAGE YOUR DIABETES BEFORE AND AFTER SURGERY  Why is it important to control my blood sugar before and after surgery? Improving blood sugar levels before and after surgery helps healing and can limit problems. A way of improving blood sugar control is eating a healthy diet by:  Eating less  sugar and carbohydrates  Increasing activity/exercise  Talking with your doctor about reaching your blood sugar goals High blood sugars (greater than 180 mg/dL) can raise your risk of infections and slow your recovery, so you will need to focus on controlling your diabetes during the weeks before surgery. Make sure that the doctor who takes care of your diabetes knows about your planned surgery including the date and location.  How do I manage my blood sugar before surgery? Check your blood sugar at least 4 times a day, starting 2 days before surgery, to make sure that the level is not too high or low.  Check your blood sugar the morning of your surgery when you wake up and every 2 hours until you get to the Short Stay unit.  If your blood sugar is less than 70 mg/dL, you will need to treat for low blood sugar: Do not take insulin. Treat a low blood sugar (less than 70 mg/dL) with  cup of clear juice (cranberry or apple), 4 glucose tablets, OR glucose gel. Recheck blood sugar in 15 minutes after treatment (to make sure it is greater than 70 mg/dL). If your blood sugar is not greater than 70 mg/dL on recheck, call [phone] for further instructions. Report your blood sugar to the short stay nurse when you get to Short Stay.  If you are admitted to the hospital after surgery: Your blood sugar will be checked by the staff and you will probably be  given insulin after surgery (instead of oral diabetes medicines) to make sure you have good blood sugar levels. The goal for blood sugar control after surgery is 80-180 mg/dL.          Do not wear jewelry or makeup. Do not wear lotions, powders, perfumes/cologne or deodorant. Do not shave 48 hours prior to surgery.  Men may shave face and neck. Do not bring valuables to the hospital. Do not wear nail polish, gel polish, artificial nails, or any other type of covering on natural nails (fingers and toes) If you have artificial nails or gel coating  that need to be removed by a nail salon, please have this removed prior to surgery. Artificial nails or gel coating may interfere with anesthesia's ability to adequately monitor your vital signs.  [facility] is not responsible for any belongings or valuables.    Do NOT Smoke (Tobacco/Vaping)  24 hours prior to your procedure  If you use a CPAP at night, you may bring your mask for your overnight stay.   Contacts, glasses, hearing aids, dentures or partials may not be worn into surgery, please bring cases for these belongings   For patients admitted to the hospital, discharge time will be determined by your treatment team.   Patients discharged the day of surgery will not be allowed to drive home, and someone needs to stay with them for 24 hours.   SURGICAL WAITING ROOM VISITATION Patients having surgery or a procedure may have no more than 2 support people in the waiting area - these visitors may rotate.   Children under the age of 23 must have an adult with them who is not the patient. If the patient needs to stay at the hospital during part of their recovery, the visitor guidelines for inpatient rooms apply. Pre-op nurse will coordinate an appropriate time for 1 support person to accompany patient in pre-op.  This support person may not rotate.   Please refer to [email or web address] for the visitor guidelines for Inpatients (after your surgery is over and you are in a regular room).    Special instructions:    Oral Hygiene is also important to reduce your risk of infection.  Remember - BRUSH YOUR TEETH THE MORNING OF SURGERY WITH YOUR REGULAR TOOTHPASTE   [place]- Preparing For Surgery  Before surgery, you can play an important role. Because skin is not sterile, your skin needs to be as free of germs as possible. You can reduce the number of germs on your skin by washing with CHG (chlorahexidine gluconate) Soap before surgery.   CHG is an antiseptic cleaner which kills germs and bonds with the skin to continue killing germs even after washing.     Please do not use if you have an allergy to CHG or antibacterial soaps. If your skin becomes reddened/irritated stop using the CHG.  Do not shave (including legs and underarms) for at least 48 hours prior to first CHG shower. It is OK to shave your face.  Please follow these instructions carefully.     Shower the NIGHT BEFORE SURGERY and the MORNING OF SURGERY with CHG Soap.   If you chose to wash your hair, wash your hair first as usual with your normal shampoo. After you shampoo, rinse your hair and body thoroughly to remove the shampoo.  Then [employer] and genitals (private parts) with your normal soap and rinse thoroughly to remove soap.  After that Use CHG Soap as you would any other liquid  soap. You can apply CHG directly to the skin and wash gently with a scrungie or a clean washcloth.   Apply the CHG Soap to your body ONLY FROM THE NECK DOWN.  Do not use on open wounds or open sores. Avoid contact with your eyes, ears, mouth and genitals (private parts). Wash Face and genitals (private parts)  with your normal soap.   Wash thoroughly, paying special attention to the area where your surgery will be performed.  Thoroughly rinse your body with warm water from the neck down.  DO NOT shower/wash with your normal soap after using and rinsing off the CHG Soap.  Pat yourself dry with a CLEAN TOWEL.  Wear CLEAN PAJAMAS to bed the night before surgery  Place CLEAN SHEETS on your bed the night before your surgery  DO NOT SLEEP WITH PETS.   Day of Surgery:  Take a shower with CHG soap. Wear Clean/Comfortable clothing the morning of surgery Do not apply any deodorants/lotions.   Remember to brush your teeth WITH YOUR REGULAR TOOTHPASTE.    If you received a COVID test during your pre-op visit, it is requested that you wear a mask when out in public, stay away  from anyone that may not be feeling well, and notify your surgeon if you develop symptoms. If you have been in contact with anyone that has tested positive in the last 10 days, please notify your surgeon.    Please read over the following fact sheets that you were given.

## 2022-11-30 NOTE — Progress Notes (Signed)
Surgical Instructions    Your procedure is scheduled on Wednesday July 10.  Report to [facility] Main Entrance "A" at 9:10 A.M., then check in with the Admitting office.  Call this number if you have problems the morning of surgery:  [phone]   If you have any questions prior to your surgery date call [phone]: Open Monday-Friday 8am-4pm If you experience any cold or flu symptoms such as cough, fever, chills, shortness of breath, etc. between now and your scheduled surgery, please notify us at the above number     Remember:  Do not eat or drink anything after midnight the night before your surgery    Take these medicines the morning of surgery with A SIP OF WATER:  aspirin 81 MG EC  atorvastatin (LIPITOR)  busPIRone (BUSPAR) cilostazol (PLETAL)  isosorbide mononitrate (IMDUR) metoprolol succinate (TOPROL-XL)  PARoxetine (PAXIL)  ranolazine (RANEXA)  XIIDRA 5 % SOLN EYE DROPS  If needed take: ALPRAZolam (XANAX)  cloNIDine (CATAPRES)  nitroGLYCERIN (NITROSTAT)   As of today, STOP taking any  Aleve, Naproxen, Ibuprofen, Motrin, Advil, Goody's, BC's, all herbal medications, fish oil, and all vitamins.  HOLD PLAVIX 5 DAYS PRIOR TO SURGERY.  LAST DOSE WILL BE December 02, 2022.   WHAT DO I DO ABOUT MY DIABETES MEDICATION?  Do not take oral diabetes medicines (pills) the morning of surgery.  DO NOT take JANUVIA the morning of surgery.   HOLD FARXIGA  72 hours before surgery.  Last dose will be 7/6.      THE MORNING OF SURGERY, take 6 units of insulin degludec (TRESIBA FLEXTOUCH) insulin.   HOW TO MANAGE YOUR DIABETES BEFORE AND AFTER SURGERY  Why is it important to control my blood sugar before and after surgery? Improving blood sugar levels before and after surgery helps healing and can limit problems. A way of improving blood sugar control is eating a healthy diet by:  Eating less sugar and carbohydrates  Increasing activity/exercise  Talking with your doctor about  reaching your blood sugar goals High blood sugars (greater than 180 mg/dL) can raise your risk of infections and slow your recovery, so you will need to focus on controlling your diabetes during the weeks before surgery. Make sure that the doctor who takes care of your diabetes knows about your planned surgery including the date and location.  How do I manage my blood sugar before surgery? Check your blood sugar at least 4 times a day, starting 2 days before surgery, to make sure that the level is not too high or low.  Check your blood sugar the morning of your surgery when you wake up and every 2 hours until you get to the Short Stay unit.  If your blood sugar is less than 70 mg/dL, you will need to treat for low blood sugar: Do not take insulin. Treat a low blood sugar (less than 70 mg/dL) with  cup of clear juice (cranberry or apple), 4 glucose tablets, OR glucose gel. Recheck blood sugar in 15 minutes after treatment (to make sure it is greater than 70 mg/dL). If your blood sugar is not greater than 70 mg/dL on recheck, call [phone] for further instructions. Report your blood sugar to the short stay nurse when you get to Short Stay.  If you are admitted to the hospital after surgery: Your blood sugar will be checked by the staff and you will probably be given insulin after surgery (instead of oral diabetes medicines) to make sure you have good blood   sugar levels. The goal for blood sugar control after surgery is 80-180 mg/dL.          Do not wear jewelry or makeup. Do not wear lotions, powders, perfumes/cologne or deodorant. Do not shave 48 hours prior to surgery.  Men may shave face and neck. Do not bring valuables to the hospital. Do not wear nail polish, gel polish, artificial nails, or any other type of covering on natural nails (fingers and toes) If you have artificial nails or gel coating that need to be removed by a nail salon, please have this removed prior to surgery.  Artificial nails or gel coating may interfere with anesthesia's ability to adequately monitor your vital signs.  [facility] is not responsible for any belongings or valuables.    Do NOT Smoke (Tobacco/Vaping)  24 hours prior to your procedure  If you use a CPAP at night, you may bring your mask for your overnight stay.   Contacts, glasses, hearing aids, dentures or partials may not be worn into surgery, please bring cases for these belongings   For patients admitted to the hospital, discharge time will be determined by your treatment team.   Patients discharged the day of surgery will not be allowed to drive home, and someone needs to stay with them for 24 hours.   SURGICAL WAITING ROOM VISITATION Patients having surgery or a procedure may have no more than 2 support people in the waiting area - these visitors may rotate.   Children under the age of 16 must have an adult with them who is not the patient. If the patient needs to stay at the hospital during part of their recovery, the visitor guidelines for inpatient rooms apply. Pre-op nurse will coordinate an appropriate time for 1 support person to accompany patient in pre-op.  This support person may not rotate.   Please refer to [email or web address] for the visitor guidelines for Inpatients (after your surgery is over and you are in a regular room).    Special instructions:    Oral Hygiene is also important to reduce your risk of infection.  Remember - BRUSH YOUR TEETH THE MORNING OF SURGERY WITH YOUR REGULAR TOOTHPASTE   [place]- Preparing For Surgery  Before surgery, you can play an important role. Because skin is not sterile, your skin needs to be as free of germs as possible. You can reduce the number of germs on your skin by washing with CHG (chlorahexidine gluconate) Soap before surgery.  CHG is an antiseptic cleaner which kills germs and bonds with the skin to continue  killing germs even after washing.     Please do not use if you have an allergy to CHG or antibacterial soaps. If your skin becomes reddened/irritated stop using the CHG.  Do not shave (including legs and underarms) for at least 48 hours prior to first CHG shower. It is OK to shave your face.  Please follow these instructions carefully.     Shower the NIGHT BEFORE SURGERY and the MORNING OF SURGERY with CHG Soap.   If you chose to wash your hair, wash your hair first as usual with your normal shampoo. After you shampoo, rinse your hair and body thoroughly to remove the shampoo.  Then Wash Face and genitals (private parts) with your normal soap and rinse thoroughly to remove soap.  After that Use CHG Soap as you would any other liquid soap. You can apply CHG directly to the skin and wash gently with a scrungie or   a clean washcloth.   Apply the CHG Soap to your body ONLY FROM THE NECK DOWN.  Do not use on open wounds or open sores. Avoid contact with your eyes, ears, mouth and genitals (private parts). Wash Face and genitals (private parts)  with your normal soap.   Wash thoroughly, paying special attention to the area where your surgery will be performed.  Thoroughly rinse your body with warm water from the neck down.  DO NOT shower/wash with your normal soap after using and rinsing off the CHG Soap.  Pat yourself dry with a CLEAN TOWEL.  Wear CLEAN PAJAMAS to bed the night before surgery  Place CLEAN SHEETS on your bed the night before your surgery  DO NOT SLEEP WITH PETS.   Day of Surgery:  Take a shower with CHG soap. Wear Clean/Comfortable clothing the morning of surgery Do not apply any deodorants/lotions.   Remember to brush your teeth WITH YOUR REGULAR TOOTHPASTE.    If you received a COVID test during your pre-op visit, it is requested that you wear a mask when out in public, stay away from anyone that may not be feeling well, and notify your surgeon if you develop  symptoms. If you have been in contact with anyone that has tested positive in the last 10 days, please notify your surgeon.    Please read over the following fact sheets that you were given.   

## 2022-11-30 NOTE — Progress Notes (Addendum)
PCP - Sandford Craze NP Cardiologist - Melany Guernsey  PPM/ICD - Denies Device Orders -  Rep Notified -   Chest x-ray - 05/05/21 EKG - 11/22/22 Stress Test - 07/27/17 ECHO - 08/14/20 Cardiac Cath - 05/05/21  Sleep Study - denies CPAP -no   Fasting Blood Sugar - 80 Checks Blood Sugar four times a day  Last dose of GLP1 agonist-  na GLP1 instructions: na  Blood Thinner Instructions: hold Plavix five days prior to surgery. Aspirin Instructions:continue Aspirin and Pletal - see special needs instructions in OR posting.   ERAS Protcol -no PRE-SURGERY Ensure or G2-   COVID TEST- na   Anesthesia review: yes  Patient denies shortness of breath, fever, cough and chest pain at PAT appointment   All instructions explained to the patient, with a verbal understanding of the material. Patient agrees to go over the instructions while at home for a better understanding. Patient also instructed to wear a mask when out in public prior to surgery. The opportunity to ask questions was provided.

## 2022-11-30 NOTE — Addendum Note (Signed)
Addended by: Leonie Douglas on: 11/30/2022 09:30 AM   Modules accepted: Level of Service

## 2022-11-30 NOTE — Progress Notes (Signed)
Surgical Instructions    Your procedure is scheduled on Wednesday July 10.  Report to [facility] Main Entrance "A" at 9:10 A.M., then check in with the Admitting office.  Call this number if you have problems the morning of surgery:  [phone]   If you have any questions prior to your surgery date call [phone]: Open Monday-Friday 8am-4pm If you experience any cold or flu symptoms such as cough, fever, chills, shortness of breath, etc. between now and your scheduled surgery, please notify us at the above number     Remember:  Do not eat or drink anything after midnight the night before your surgery    Take these medicines the morning of surgery with A SIP OF WATER:  aspirin 81 MG EC  atorvastatin (LIPITOR)  busPIRone (BUSPAR) cilostazol (PLETAL)  isosorbide mononitrate (IMDUR) metoprolol succinate (TOPROL-XL)  PARoxetine (PAXIL)  ranolazine (RANEXA)  XIIDRA 5 % SOLN EYE DROPS  If needed take: ALPRAZolam Prudy Feeler)  cloNIDine (CATAPRES)  nitroGLYCERIN (NITROSTAT)   As of today, STOP taking any  Aleve, Naproxen, Ibuprofen, Motrin, Advil, Goody's, BC's, all herbal medications, fish oil, and all vitamins.  HOLD PLAVIX 5 DAYS PRIOR TO SURGERY.  LAST DOSE WILL BE December 02, 2022.   WHAT DO I DO ABOUT MY DIABETES MEDICATION?  Do not take oral diabetes medicines (pills) the morning of surgery.  DO NOT take JANUVIA the morning of surgery.   HOLD FARXIGA  72 hours before surgery.  Last dose will be 7/6.      THE MORNING OF SURGERY, take 6 units of insulin degludec (TRESIBA FLEXTOUCH) insulin.   HOW TO MANAGE YOUR DIABETES BEFORE AND AFTER SURGERY  Why is it important to control my blood sugar before and after surgery? Improving blood sugar levels before and after surgery helps healing and can limit problems. A way of improving blood sugar control is eating a healthy diet by:  Eating less sugar and carbohydrates  Increasing activity/exercise  Talking with your doctor about  reaching your blood sugar goals High blood sugars (greater than 180 mg/dL) can raise your risk of infections and slow your recovery, so you will need to focus on controlling your diabetes during the weeks before surgery. Make sure that the doctor who takes care of your diabetes knows about your planned surgery including the date and location.  How do I manage my blood sugar before surgery? Check your blood sugar at least 4 times a day, starting 2 days before surgery, to make sure that the level is not too high or low.  Check your blood sugar the morning of your surgery when you wake up and every 2 hours until you get to the Short Stay unit.  If your blood sugar is less than 70 mg/dL, you will need to treat for low blood sugar: Do not take insulin. Treat a low blood sugar (less than 70 mg/dL) with  cup of clear juice (cranberry or apple), 4 glucose tablets, OR glucose gel. Recheck blood sugar in 15 minutes after treatment (to make sure it is greater than 70 mg/dL). If your blood sugar is not greater than 70 mg/dL on recheck, call [phone] for further instructions. Report your blood sugar to the short stay nurse when you get to Short Stay.  If you are admitted to the hospital after surgery: Your blood sugar will be checked by the staff and you will probably be given insulin after surgery (instead of oral diabetes medicines) to make sure you have good blood  sugar levels. The goal for blood sugar control after surgery is 80-180 mg/dL.          Do not wear jewelry or makeup. Do not wear lotions, powders, perfumes/cologne or deodorant. Do not shave 48 hours prior to surgery.  Men may shave face and neck. Do not bring valuables to the hospital. Do not wear nail polish, gel polish, artificial nails, or any other type of covering on natural nails (fingers and toes) If you have artificial nails or gel coating that need to be removed by a nail salon, please have this removed prior to surgery.  Artificial nails or gel coating may interfere with anesthesia's ability to adequately monitor your vital signs.  [facility] is not responsible for any belongings or valuables.    Do NOT Smoke (Tobacco/Vaping)  24 hours prior to your procedure  If you use a CPAP at night, you may bring your mask for your overnight stay.   Contacts, glasses, hearing aids, dentures or partials may not be worn into surgery, please bring cases for these belongings   For patients admitted to the hospital, discharge time will be determined by your treatment team.   Patients discharged the day of surgery will not be allowed to drive home, and someone needs to stay with them for 24 hours.   SURGICAL WAITING ROOM VISITATION Patients having surgery or a procedure may have no more than 2 support people in the waiting area - these visitors may rotate.   Children under the age of 52 must have an adult with them who is not the patient. If the patient needs to stay at the hospital during part of their recovery, the visitor guidelines for inpatient rooms apply. Pre-op nurse will coordinate an appropriate time for 1 support person to accompany patient in pre-op.  This support person may not rotate.   Please refer to [email or web address] for the visitor guidelines for Inpatients (after your surgery is over and you are in a regular room).    Special instructions:    Oral Hygiene is also important to reduce your risk of infection.  Remember - BRUSH YOUR TEETH THE MORNING OF SURGERY WITH YOUR REGULAR TOOTHPASTE   [facility]- Preparing For Surgery  Before surgery, you can play an important role. Because skin is not sterile, your skin needs to be as free of germs as possible. You can reduce the number of germs on your skin by washing with CHG (chlorahexidine gluconate) Soap before surgery.  CHG is an antiseptic cleaner which kills germs and bonds with the skin to continue  killing germs even after washing.     Please do not use if you have an allergy to CHG or antibacterial soaps. If your skin becomes reddened/irritated stop using the CHG.  Do not shave (including legs and underarms) for at least 48 hours prior to first CHG shower. It is OK to shave your face.  Please follow these instructions carefully.     Shower the NIGHT BEFORE SURGERY and the MORNING OF SURGERY with CHG Soap.   If you chose to wash your hair, wash your hair first as usual with your normal shampoo. After you shampoo, rinse your hair and body thoroughly to remove the shampoo.  Then [employer] and genitals (private parts) with your normal soap and rinse thoroughly to remove soap.  After that Use CHG Soap as you would any other liquid soap. You can apply CHG directly to the skin and wash gently with a scrungie or  a clean washcloth.   Apply the CHG Soap to your body ONLY FROM THE NECK DOWN.  Do not use on open wounds or open sores. Avoid contact with your eyes, ears, mouth and genitals (private parts). Wash Face and genitals (private parts)  with your normal soap.   Wash thoroughly, paying special attention to the area where your surgery will be performed.  Thoroughly rinse your body with warm water from the neck down.  DO NOT shower/wash with your normal soap after using and rinsing off the CHG Soap.  Pat yourself dry with a CLEAN TOWEL.  Wear CLEAN PAJAMAS to bed the night before surgery  Place CLEAN SHEETS on your bed the night before your surgery  DO NOT SLEEP WITH PETS.   Day of Surgery:  Take a shower with CHG soap. Wear Clean/Comfortable clothing the morning of surgery Do not apply any deodorants/lotions.   Remember to brush your teeth WITH YOUR REGULAR TOOTHPASTE.    If you received a COVID test during your pre-op visit, it is requested that you wear a mask when out in public, stay away from anyone that may not be feeling well, and notify your surgeon if you develop  symptoms. If you have been in contact with anyone that has tested positive in the last 10 days, please notify your surgeon.    Please read over the following fact sheets that you were given.

## 2022-11-30 NOTE — Progress Notes (Deleted)
Surgical Instructions    Your procedure is scheduled on Wednesday July 10.  Report to [facility] Main Entrance "A" at 9:10 A.M., then check in with the Admitting office.  Call this number if you have problems the morning of surgery:  [phone]   If you have any questions prior to your surgery date call [phone]: Open Monday-Friday 8am-4pm If you experience any cold or flu symptoms such as cough, fever, chills, shortness of breath, etc. between now and your scheduled surgery, please notify us at the above number     Remember:  Do not eat or drink anything after midnight the night before your surgery    Take these medicines the morning of surgery with A SIP OF WATER:  aspirin 81 MG EC  atorvastatin (LIPITOR)  busPIRone (BUSPAR) cilostazol (PLETAL)  isosorbide mononitrate (IMDUR) metoprolol succinate (TOPROL-XL)  PARoxetine (PAXIL)  ranolazine (RANEXA)  XIIDRA 5 % SOLN EYE DROPS  If needed take: ALPRAZolam (XANAX)  cloNIDine (CATAPRES)  nitroGLYCERIN (NITROSTAT)   As of today, STOP taking any  Aleve, Naproxen, Ibuprofen, Motrin, Advil, Goody's, BC's, all herbal medications, fish oil, and all vitamins.  HOLD PLAVIX 5 DAYS PRIOR TO SURGERY.  LAST DOSE WILL BE December 02, 2022.   WHAT DO I DO ABOUT MY DIABETES MEDICATION?  Do not take oral diabetes medicines (pills) the morning of surgery.  DO NOT take JANUVIA the morning of surgery.   HOLD FARXIGA  72 hours before surgery.  Last dose will be 7/6.      THE MORNING OF SURGERY, take 6 units of insulin degludec (TRESIBA FLEXTOUCH) insulin.   HOW TO MANAGE YOUR DIABETES BEFORE AND AFTER SURGERY  Why is it important to control my blood sugar before and after surgery? Improving blood sugar levels before and after surgery helps healing and can limit problems. A way of improving blood sugar control is eating a healthy diet by:  Eating less sugar and carbohydrates  Increasing activity/exercise  Talking with your doctor about  reaching your blood sugar goals High blood sugars (greater than 180 mg/dL) can raise your risk of infections and slow your recovery, so you will need to focus on controlling your diabetes during the weeks before surgery. Make sure that the doctor who takes care of your diabetes knows about your planned surgery including the date and location.  How do I manage my blood sugar before surgery? Check your blood sugar at least 4 times a day, starting 2 days before surgery, to make sure that the level is not too high or low.  Check your blood sugar the morning of your surgery when you wake up and every 2 hours until you get to the Short Stay unit.  If your blood sugar is less than 70 mg/dL, you will need to treat for low blood sugar: Do not take insulin. Treat a low blood sugar (less than 70 mg/dL) with  cup of clear juice (cranberry or apple), 4 glucose tablets, OR glucose gel. Recheck blood sugar in 15 minutes after treatment (to make sure it is greater than 70 mg/dL). If your blood sugar is not greater than 70 mg/dL on recheck, call [phone] for further instructions. Report your blood sugar to the short stay nurse when you get to Short Stay.  If you are admitted to the hospital after surgery: Your blood sugar will be checked by the staff and you will probably be given insulin after surgery (instead of oral diabetes medicines) to make sure you have good blood   sugar levels. The goal for blood sugar control after surgery is 80-180 mg/dL.          Do not wear jewelry or makeup. Do not wear lotions, powders, perfumes/cologne or deodorant. Do not shave 48 hours prior to surgery.  Men may shave face and neck. Do not bring valuables to the hospital. Do not wear nail polish, gel polish, artificial nails, or any other type of covering on natural nails (fingers and toes) If you have artificial nails or gel coating that need to be removed by a nail salon, please have this removed prior to surgery.  Artificial nails or gel coating may interfere with anesthesia's ability to adequately monitor your vital signs.  [facility] is not responsible for any belongings or valuables.    Do NOT Smoke (Tobacco/Vaping)  24 hours prior to your procedure  If you use a CPAP at night, you may bring your mask for your overnight stay.   Contacts, glasses, hearing aids, dentures or partials may not be worn into surgery, please bring cases for these belongings   For patients admitted to the hospital, discharge time will be determined by your treatment team.   Patients discharged the day of surgery will not be allowed to drive home, and someone needs to stay with them for 24 hours.   SURGICAL WAITING ROOM VISITATION Patients having surgery or a procedure may have no more than 2 support people in the waiting area - these visitors may rotate.   Children under the age of 16 must have an adult with them who is not the patient. If the patient needs to stay at the hospital during part of their recovery, the visitor guidelines for inpatient rooms apply. Pre-op nurse will coordinate an appropriate time for 1 support person to accompany patient in pre-op.  This support person may not rotate.   Please refer to [email or web address] for the visitor guidelines for Inpatients (after your surgery is over and you are in a regular room).    Special instructions:    Oral Hygiene is also important to reduce your risk of infection.  Remember - BRUSH YOUR TEETH THE MORNING OF SURGERY WITH YOUR REGULAR TOOTHPASTE   [facility]- Preparing For Surgery  Before surgery, you can play an important role. Because skin is not sterile, your skin needs to be as free of germs as possible. You can reduce the number of germs on your skin by washing with CHG (chlorahexidine gluconate) Soap before surgery.  CHG is an antiseptic cleaner which kills germs and bonds with the skin to continue  killing germs even after washing.     Please do not use if you have an allergy to CHG or antibacterial soaps. If your skin becomes reddened/irritated stop using the CHG.  Do not shave (including legs and underarms) for at least 48 hours prior to first CHG shower. It is OK to shave your face.  Please follow these instructions carefully.     Shower the NIGHT BEFORE SURGERY and the MORNING OF SURGERY with CHG Soap.   If you chose to wash your hair, wash your hair first as usual with your normal shampoo. After you shampoo, rinse your hair and body thoroughly to remove the shampoo.  Then Wash Face and genitals (private parts) with your normal soap and rinse thoroughly to remove soap.  After that Use CHG Soap as you would any other liquid soap. You can apply CHG directly to the skin and wash gently with a scrungie or   a clean washcloth.   Apply the CHG Soap to your body ONLY FROM THE NECK DOWN.  Do not use on open wounds or open sores. Avoid contact with your eyes, ears, mouth and genitals (private parts). Wash Face and genitals (private parts)  with your normal soap.   Wash thoroughly, paying special attention to the area where your surgery will be performed.  Thoroughly rinse your body with warm water from the neck down.  DO NOT shower/wash with your normal soap after using and rinsing off the CHG Soap.  Pat yourself dry with a CLEAN TOWEL.  Wear CLEAN PAJAMAS to bed the night before surgery  Place CLEAN SHEETS on your bed the night before your surgery  DO NOT SLEEP WITH PETS.   Day of Surgery:  Take a shower with CHG soap. Wear Clean/Comfortable clothing the morning of surgery Do not apply any deodorants/lotions.   Remember to brush your teeth WITH YOUR REGULAR TOOTHPASTE.    If you received a COVID test during your pre-op visit, it is requested that you wear a mask when out in public, stay away from anyone that may not be feeling well, and notify your surgeon if you develop  symptoms. If you have been in contact with anyone that has tested positive in the last 10 days, please notify your surgeon.    Please read over the following fact sheets that you were given.   

## 2022-12-01 NOTE — Progress Notes (Signed)
Anesthesia Chart Review:  Follows with cardiology for history of HTN, HLD, PAD, PE, CAD s/p CABG x5 in 2006 followup cath in 2007 showed patent grafts and improved LV function EF 65%. She was in the hospital 05/05/2021 with NSTEMI with troponin peaking at 3359.  Cardiac catheterization showed severe multivessel native CAD, 3 out of 5 grafts patent (LIMA-LAD, free RIMA-diagonal, left radial-RPDA) culprit lesion felt to be 100% occluded SVG-OM-OM2 with patent OM1-OM2 limb.  Seen by Bailey Reining, NP on 11/22/2022 for preop evaluation.  Per note, "Chart reviewed as part of pre-operative protocol coverage. Given past medical history and time since last visit, based on ACC/AHA guidelines, Bailey Greene would be at acceptable risk for the planned procedure without further cardiovascular testing. Please hold Plavix 5 days prior to procedure and begin again as soon as hemodynamically stable at the discretion of the surgeon.  Hold aspirin 72 hours prior to procedure and begin again at the discretion of the surgeon.  Hold Farxiga 24 hours prior to procedure and begin again at the discretion of the surgeon."  Patient reports she was instructed to hold Plavix 5 days prior to surgery.  Former smoker, quit April 2023.  IDDM 2, A1c 6.5 on 10/05/2022.  Preop labs reviewed, creatinine mildly elevated 1.36, mild anemia with hemoglobin 11.5, otherwise unremarkable.  EKG 11/22/2022: NSR.  Rate 67.  Possible LAE.  Septal infarct.  Cath 05/05/2021: POST-OPERATIVE DIAGNOSIS:   Severe native CAD: Subtotal CTO of mid RCA 90% ostial LAD; the LAD actually does course all the way to the apex with the main branch not grafted being a large septal perforator branch. Codominant LCx with bifurcating OM1 (recorded is OM1 OM 2) that are both small in caliber with 90% OM1 and 60% OM2 stenoses.  Retrograde filling of distal OM1 via the remaining OM1-OM2 vein graft is still present.  The remainder the LCx runs into the AV groove  giving rise to 2 small caliber posterolateral branches with mild diffuse disease. 3.5 of 5 grafts patent Culprit lesion: 100% flush occlusion of SeqSVG-OM1-OM2 with patent OM1-OM2 limb Widely patent LIMA-distal LAD with 40% anastomotic lesion Widely patent free RIMA-Diag with 50% anastomotic lesion Widely patent Left Radial-RPDA with 60% mid-distal PDA disease.  Retrograde filling to small caliber posterolateral system and occluded distal RCA Normal LVEDP Heavily calcified right common femoral artery just proximal to bifurcation.  Estimate potential 50% stenosis.  The right SFA is 100% occluded just after the bifurcation with patent profunda artery that has collateralization. => Recommend PV consult     PLAN OF CARE:   Return to nursing unit for ongoing care.  Does not necessarily require further IV heparin.  Medical management with guideline directed medical therapy. With ACS presentation, I did load with Plavix 300 mg and will start 75 mg daily Plavix (okay to interrupt) for 1 year.   Images reviewed with Dr. Tresa Endo.  TTE 08/14/2020:  1. Left ventricular ejection fraction, by estimation, is 60 to 65%. The  left ventricle has normal function. The left ventricle has no regional  wall motion abnormalities. Left ventricular diastolic parameters were  normal. The average left ventricular  global longitudinal strain is -19.5 %.   2. Right ventricular systolic function is normal. The right ventricular  size is normal.   3. The mitral valve is normal in structure. No evidence of mitral valve  regurgitation.   4. The aortic valve is normal in structure. Aortic valve regurgitation is  not visualized. No aortic stenosis is present.  Comparison(s): 07/19/17 EF 55-60%.     Bailey Greene [facility] Short Stay Center/Anesthesiology Phone [phone] 12/01/2022 12:56 PM

## 2022-12-01 NOTE — Anesthesia Preprocedure Evaluation (Addendum)
Anesthesia Evaluation  Patient identified by MRN, date of birth, ID band Patient awake    Reviewed: Allergy & Precautions, NPO status , Patient's Chart, lab work & pertinent test results, reviewed documented beta blocker date and time   History of Anesthesia Complications Negative for: history of anesthetic complications  Airway Mallampati: I  TM Distance: >3 FB Neck ROM: Full    Dental  (+) Edentulous Upper, Edentulous Lower   Pulmonary Patient abstained from smoking., former smoker   breath sounds clear to auscultation       Cardiovascular hypertension, Pt. on medications and Pt. on home beta blockers + angina with exertion + CAD, + CABG and + Peripheral Vascular Disease   Rhythm:Regular Rate:Normal  '22 ECHO:EF 60-65%, normal LVF, normal RVF, no significant valvular abnormalities   Neuro/Psych  Headaches  Anxiety  Bipolar Disorder      GI/Hepatic negative GI ROS, Neg liver ROS,,,  Endo/Other  diabetes (glu 81), Insulin Dependent, Oral Hypoglycemic Agents    Renal/GU Renal InsufficiencyRenal disease     Musculoskeletal   Abdominal   Peds  Hematology plavix   Anesthesia Other Findings   Reproductive/Obstetrics                             Anesthesia Physical Anesthesia Plan  ASA: 4  Anesthesia Plan: General   Post-op Pain Management: Tylenol PO (pre-op)*   Induction: Intravenous  PONV Risk Score and Plan: 3 and Ondansetron, Dexamethasone and Treatment may vary due to age or medical condition  Airway Management Planned: Oral ETT  Additional Equipment: Arterial line  Intra-op Plan:   Post-operative Plan: Extubation in OR  Informed Consent: I have reviewed the patients History and Physical, chart, labs and discussed the procedure including the risks, benefits and alternatives for the proposed anesthesia with the patient or authorized representative who has indicated his/her  understanding and acceptance.     Dental advisory given  Plan Discussed with: CRNA and Surgeon  Anesthesia Plan Comments: (PAT note by Antionette Poles, PA-C:  Follows with cardiology for history of HTN, HLD, PAD, PE, CAD s/p CABG x5 in 2006 followup cath in 2007 showed patent grafts and improved LV function EF 65%. She was in the hospital 05/05/2021 with NSTEMI with troponin peaking at 3359. Cardiac catheterization showed severe multivessel native CAD, 3 out of 5 grafts patent (LIMA-LAD, free RIMA-diagonal, left radial-RPDA) culprit lesion felt to be 100% occluded SVG-OM-OM2 with patent OM1-OM2 limb.  Seen by Joni Reining, NP on 11/22/2022 for preop evaluation.  Per note, "Chart reviewed as part of pre-operative protocol coverage. Given past medical history and time since last visit, based on ACC/AHA guidelines, Bailey Greene would be at acceptable risk for the planned procedure without further cardiovascular testing. Please hold Plavix 5 days prior to procedure and begin again as soon as hemodynamically stable at the discretion of the surgeon.  Hold aspirin 72 hours prior to procedure and begin again at the discretion of the surgeon.  Hold Farxiga 24 hours prior to procedure and begin again at the discretion of the surgeon."  Patient reports she was instructed to hold Plavix 5 days prior to surgery.  Former smoker, quit April 2023.  IDDM 2, A1c 6.5 on 10/05/2022.  Preop labs reviewed, creatinine mildly elevated 1.36, mild anemia with hemoglobin 11.5, otherwise unremarkable.  EKG 11/22/2022: NSR.  Rate 67.  Possible LAE.  Septal infarct.  Cath 05/05/2021: POST-OPERATIVE DIAGNOSIS:   ? Severe native CAD:  o Subtotal CTO of mid RCA  o 90% ostial LAD; the LAD actually does course all the way to the apex with the main branch not grafted being a large septal perforator branch.  o Codominant LCx with bifurcating OM1 (recorded is OM1 OM 2) that are both small in caliber with 90% OM1 and 60% OM2  stenoses.  Retrograde filling of distal OM1 via the remaining OM1-OM2 vein graft is still present.  The remainder the LCx runs into the AV groove giving rise to 2 small caliber posterolateral branches with mild diffuse disease. ? 3.5 of 5 grafts patent  o Culprit lesion: 100% flush occlusion of SeqSVG-OM1-OM2 with patent OM1-OM2 limb  o Widely patent LIMA-distal LAD with 40% anastomotic lesion  o Widely patent free RIMA-Diag with 50% anastomotic lesion  o Widely patent Left Radial-RPDA with 60% mid-distal PDA disease.  Retrograde filling to small caliber posterolateral system and occluded distal RCA ? Normal LVEDP ? Heavily calcified right common femoral artery just proximal to bifurcation.  Estimate potential 50% stenosis.  The right SFA is 100% occluded just after the bifurcation with patent profunda artery that has collateralization. => Recommend PV consult   PLAN OF CARE:   ? Return to nursing unit for ongoing care.  Does not necessarily require further IV heparin.  Medical management with guideline directed medical therapy. ? With ACS presentation, I did load with Plavix 300 mg and will start 75 mg daily Plavix (okay to interrupt) for 1 year.  ? Images reviewed with Dr. Tresa Endo.  TTE 08/14/2020: 1. Left ventricular ejection fraction, by estimation, is 60 to 65%. The  left ventricle has normal function. The left ventricle has no regional  wall motion abnormalities. Left ventricular diastolic parameters were  normal. The average left ventricular  global longitudinal strain is -19.5 %.  2. Right ventricular systolic function is normal. The right ventricular  size is normal.  3. The mitral valve is normal in structure. No evidence of mitral valve  regurgitation.  4. The aortic valve is normal in structure. Aortic valve regurgitation is  not visualized. No aortic stenosis is present.   Comparison(s): 07/19/17 EF 55-60%.     )        Anesthesia Quick Evaluation

## 2022-12-03 ENCOUNTER — Other Ambulatory Visit: Payer: Self-pay | Admitting: Psychiatry

## 2022-12-03 DIAGNOSIS — F3162 Bipolar disorder, current episode mixed, moderate: Secondary | ICD-10-CM

## 2022-12-06 ENCOUNTER — Telehealth: Payer: Self-pay

## 2022-12-06 ENCOUNTER — Encounter: Payer: Self-pay | Admitting: Psychiatry

## 2022-12-06 ENCOUNTER — Ambulatory Visit (INDEPENDENT_AMBULATORY_CARE_PROVIDER_SITE_OTHER): Payer: 59 | Admitting: Psychiatry

## 2022-12-06 DIAGNOSIS — F4001 Agoraphobia with panic disorder: Secondary | ICD-10-CM | POA: Diagnosis not present

## 2022-12-06 DIAGNOSIS — F411 Generalized anxiety disorder: Secondary | ICD-10-CM | POA: Diagnosis not present

## 2022-12-06 DIAGNOSIS — F5105 Insomnia due to other mental disorder: Secondary | ICD-10-CM | POA: Diagnosis not present

## 2022-12-06 DIAGNOSIS — F3162 Bipolar disorder, current episode mixed, moderate: Secondary | ICD-10-CM | POA: Diagnosis not present

## 2022-12-06 NOTE — Progress Notes (Signed)
Bailey Greene [id number] 08-23-1960 62 y.o.   Subjective:   Patient ID:  Bailey Greene is a 62 y.o. (DOB [birth year]) female.  Chief Complaint:  Chief Complaint  Patient presents with   Follow-up   Depression   Anxiety    Anxiety Symptoms include nervous/anxious behavior. Patient reports no decreased concentration, dizziness, nausea or palpitations.     Bailey Greene presents for follow-up of bipolar disorder and panic attacks and general anxiety.  visit 5/22,2020.  Increased Depakote then to 1500 mg daily.  Boss rec LOA for 4 weeks.  Going through separation is really tough.  Panic attacks at work interfering.  Had this job 10 years.  Likes the job but hard to concentrate and stay focused.  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  Panic incr pulse and SOB, fear, sweating and shakey.  Panic lasts 20 mins and increased frequency.  Several in a day.  Going on for 2 mos but getting worse.  Boss can tell from listening to her calls and drop in production.  At follow up visit November 22, 2018.  Mood swings are better.  Trouble staying asleep.  Caffeine 1 coffee and 1 soda daily.She was still having severe anxiety plus panic.  We discussed the risk of SSRIs trickling triggering mood swings but the severity of the anxiety was such that we decided to initiate fluoxetine 10 mg daily to increase to 20 mg daily.  We also were using low-dose mirtazapine to help with sleep.  seen January 18, 2019.  She was granted medical leave for panic symptoms.  She was switched to paroxetine for panic from fluoxetine.  Since she was switched from mirtazapine to quetiapine 25 to 50 mg nightly for insomnia.   November 2020 visit with the following noted: It is helping some with anxiety but a lot of stress.  No unusual mood swings without a change.   She's satisfied with the 20 mg paroxetine. Sleep is much better with quetiapine and xanax at night.  5 hour and sometimes better depending on work schedule.    No meds were changed.    10/23/2019 visit with the following noted: Anxiety, dep, panic attacks all worse lately for 2 mos.  Anger also worse mostly just at home bc manages it at work.  Several triggers.  Sister passed March 26 after illness.  Work and daughter are stressful.  Overwhelming. Not as good staying asleep.  Random panic.  Feels SOB.  Wanting to isolate. Spontaneous crying spell.s Poor concentration affecting work.    Plan: Increase paroxetine to 1-1/2 of the 20 mg tablets daily For sleep increase quetiapine to 2 the 25 mg tablets nightly  03/05/20 appt with following noted: Less anxious and sleeping is better.  Panic can be triggered at work with SOB and heart racing.  Happens about 2 times weekly. Main stress is work is overwhelming.  Talk with customers all day.   No clear mania.   Still some depression too. No SE Plan: no med changes except increase paroxetine to 40 for anxiety  09/02/2020 appt noted: Dx DM since here. Anxiety and panic worse since January.  Consistent with meds.  Stress dx DM.  Sister passed away.  Panic with SOB and occ sweats.  Panic daily. Usually before noon usually with trigger. Xanax makes her relax. 1 cup coffee AM.   Sleep 4-5 hours with quetiapine 25 mg.   And pretty consistent. Mood feels labile.  About to lose it including irritable and angry.  Plan: Increase Quetiapine 25 mg 2 mg HS.   12/10/20 appt noted: Dizziniess for awhile after paroxetine resolved. Still having anxiety and panic but not daily.   Average 7/10 anxiety usually triggered with work as primary stress.  She and H still dealing with problems.  Panic worse either in the AM or evening. Sleep is better 4-5 hours nightly. Taking quetiapine 50 mg with Xanax at night. Still on Depakote ER 1500, busipirone 30 BID , paroxetine 40 mg daily No anger problems at work.  Appetite is better.  Regaining some weight. Evening walks helps mental health. Plan: Start olanzapine 5 mg daily and if  that is not sufficient within a week increase to 10 mg nightly.  We can go higher if needed and call if necessary. BC failure of alternatives, alprazolam 0.5 mg AM before work.   02/18/2021 appointment with the following noted: Anx down to 4-5/10.  Notices calmer with Xanax and throughout the day.Less anxiety and panic at work but getting better. Depression about the same 6/10.  Tries to distract herself from problems at home.. Sleep 6 hours now with olanzapine.   No SE Plan: increase olanzapine 15 mg HS. BC failure of alternatives, alprazolam 0.5 mg AM before work.   paroxetine to 40 mg at night.  04/28/2021 appointment with the following noted: Increase olanzapine 15 mg HS helped awhile with initial dizziness resolved.  Not drowsy. Still better than it was but holidays are hard.  Better than in a long time overall.  H saw a difference also.   Sleep 5-6 hours.  Never been a 7 hour sleeper.   Eating is normal now. Depression still there but better also 3/10.  Can enjoy some things. Better interest. Most stressful thing about work is the work load and talking with customers who  are difficult.  Had this job 13 years. Plan: Increase olanzapine 20 mg 3 hours before HS.  06/10/2021 phone call complaining of persistent insomnia.  She was allowed to increase alprazolam to 0.5 mg every morning and 1 mg nightly  06/30/2021 appointment with the following noted: Taking alprazolam 0.5 mg 2 daily usually. Increased olanzapine to 20 mg daily. CABG 2006.  Valve problem with CP and hospitalized.  Change in med. Going better now. No problems with meds.   About 6 hour sleep and in bed earlier.  Depression is OK overall but residual anxiety and crying.  Anxiety around health now too and work. Panic is not gone but better on Paroxetine 20 Sometimes brief irritability situationally. Plan: No med changes.  Continue olanzapine 20 mg, paroxetine 40 mg, alprazolam 0.5 mg twice daily as needed  11/18/2021  appointment with the following noted: At one point since here anxiety got a lot worse but getting better now.  Some panic attacks including Saturday.   Compliant.  More work stress trying to get caught up.   Only taking alprazolam 0.5 mg HS and not in daytime.  Not sleepy with it daytime. No SE No depression or anxiety.   Plan: no med changes  04/08/22 TC:  increased anxiety and panic wanting med changes, so increase paroxetine to 60 mg daily  04/14/22 appt noted: Current meds alprazolam 0.5 mg 3 times daily as needed anxiety, buspirone 30 mg twice daily, Depakote ER 1500 mg nightly,, Olanzapine 20 mg nightly, quetiapine 25 mg nightly for sleep, paroxetine 60 mg as of 04/08/22 Anxiety and panic worse for a month and missed some work and then taken out of work by PCP  03/30/22. Sx including panic with SOB. At least 2-3 times per day.  Worrying about everything she has to do including doctor's appts needs eye surgery.  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 Having to talk with customers all day triggers panic and can't talk during panic attacks. Even outside of work trouble getting things done Dt low energy and anxiety.  Everything closing in at one tgime and trouble breathing.  Feel overwhelmed all the time.   Also depression and has to push herself to go out of the house and low energy. Sleep is about 5-6 hours per night.   Wants to stay out of work until 12/22 to give time for doctor appts and get herself together. Current job 13 years. No med changes  05/17/22 appt noted: Anxiety is awful right now.  Hard to breath.  Heart races at times with panic.  Mind races.   2-3 panic attacks daily. SE maybe some sleepiness. Sleep about 4-5 hours.  Can't calm her mind down.   No recent counselors but has appt on 12/27 pending. Still out of work.  [place] asking about the TX plan  Plan: DC buspirone Off label clonidine and increase to 0.1 mg BID for severe anxiety and panic   06/29/2022  appointment noted: Current psychiatric medication clonidine 0.2 mg daily, Depakote ER 1500 nightly, alprazolam 0.5 mg twice daily as needed anxiety, olanzapine 20 mg nightly, paroxetine 60 mg daily, quetiapine 25 mg nightly Clonidine is the first time that seen a change with anxiety.  It immediately helps.  Checking BP and all systolic pressures above 100. Better response with BID dosing.  Less SOB.  Less irritable.   SE dry mouth, no others. Sleep is better  but still only 4-5 hours.   Depression is gradually getting better but not there yet. Back at work and clonidine helps her deal with the pressure better there.  Handling it better.  Work function is pretty good with constant volume.  Before was SOB bc couldn't relax to breathe. 2 therapy appts. Plan: Consider further increase olanzapine if necessary Continue olanzapine 20 mg 3 hours before HS. Continue Depakote ER 1500 mg HS Off label clonidine 0.1 mg BID for severe anxiety and panic has helped reduce anxiety by over 50% She didn't each or drink much today and BP borderline wihtout  SX.  Push fluids. Increased paroxetine to 60 mg daily as of 04/08/22 for worsening panic. No better so far with increased dose. BC failure of alternatives, alprazolam 0.5 mg AM before work twice daily.   08/31/22 appt noted: Stopped Depakote last vist and only alprazolam 0.5 mg HS now. MedS: In a much better place .  Still anxious but not like it used to be.  Couldn't control it before.  Less SOB. Still some panic but not as easy.  Still get triggered at work or home that realizes she should not get as upset over.  Easily overwhelmed with high demands. Sleep is better about 5-6 hours. Was having NM but less now.  Was waking H with them but not now. No SE noted.   BP usally above 100/70.  Not lightheaded or dizzy with standing. Plan: no med changes  11/01/22 apt noted: Psych meds: Alprazolam 0.5 mg 3 times daily as needed, clonidine 0.1 mg twice daily and 1  extra as needed anxiety, olanzapine 20 mg nightly, paroxetine 60 mg daily. I feel like I'm having a breakdown with anxiety.  Overwhelmed with everything with multiple stressors. Worse about  2 weeks.  Additional stressors at work.  More mistakes at work.  Overwhelmed.  Some criticism from boss who notices the decline in function. Uncontrollable crying spells.  Tired.  More depressed and anxious.   ? Blood clot in leg.   Feeling more angry and poor conc with pressure at work.  Usually able to function at work but right now not good.  Feels like she will breakdown and lose it.  No SI but dep a lot.  Don't feel like doing as much as usual.  Feels SOB and having panic at work.   Not sleeping as much as she should but not insomnia. No SE.   Plan: OK FMLA .  Out of work until July 9 when has FU appt. Increase paroxetine to high dose 80 for TR anxiety and depression.    12/06/22 appt noted: Psych meds: as above.  Paroxetine 80 mg daily. Buspirone 30 mg daily. Alprazolam 0.5 mg 3 times daily as needed, clonidine 0.1 mg twice daily and 1 extra as needed anxiety, olanzapine 20 mg nightly Not much change in dep or anxiety.  Will have stent placed in artery in R leg on 7/10 for claudication.  Creating stress and anxiety from that also. Anxiety worse than dep.  Having panic attacks with SOB daily.  Created by worry over health and job and life ongoing.  Even though tries to relax anxiety takes over.    Sleep is not as good 4-5 hours with worry.   Consistent with meds and no SE  PDMP only shows Xanax.  She denies abusing substances  Past Psychiatric Medication Trials: Hydroxyzine, mirtazapine poor response for sleep,   Depakote 1500,  quetiapine low-dose for sleep Olanzapine 20  duloxetine, citalopram, Wellbutrin, fluoxetine, paroxetine 80 Clonidine 0.1 BID Xanax,  Buspirone NR  Notes and chart were reviewed with the patient regarding prior history and symptoms.  Review of Systems:  Review of  Systems  Constitutional:  Positive for fatigue.  Cardiovascular:  Positive for leg swelling. Negative for palpitations.  Gastrointestinal:  Negative for nausea.  Musculoskeletal:  Positive for myalgias.  Neurological:  Negative for dizziness, tremors and light-headedness.  Psychiatric/Behavioral:  Positive for dysphoric mood. Negative for decreased concentration and sleep disturbance. The patient is nervous/anxious.     Medications: I have reviewed the patient's current medications.  Current Outpatient Medications  Medication Sig Dispense Refill   ACCU-CHEK GUIDE test strip USE UP TO FOUR TIMES DAILY AS DIRECTED 400 strip 6   Accu-Chek Softclix Lancets lancets USE UP TO FOUR TIMES DAILY AS DIRECTED 100 each 12   ALPRAZolam (XANAX) 0.5 MG tablet TAKE 1 TABLET (0.5 MG TOTAL) BY MOUTH 3 (THREE) TIMES DAILY AS NEEDED FOR ANXIETY. 90 tablet 0   aspirin 81 MG EC tablet Take 1 tablet (81 mg total) by mouth daily. Swallow whole. 30 tablet 12   atorvastatin (LIPITOR) 80 MG tablet TAKE 1 TABLET BY MOUTH DAILY 90 tablet 3   BD PEN NEEDLE NANO U/F 32G X 4 MM MISC USE AS DIRECTED 300 each 1   blood glucose meter kit and supplies KIT Dispense based on patient and insurance preference. Use up to four times daily as directed. (FOR ICD-9 250.00, 250.01). 1 each 0   busPIRone (BUSPAR) 30 MG tablet Take 30 mg by mouth daily.     Cholecalciferol (VITAMIN D3) 75 MCG (3000 UT) TABS Take 1 tablet by mouth daily at 6 (six) AM. 30 tablet    cilostazol (PLETAL) 100 MG tablet Take  1 tablet (100 mg total) by mouth 2 (two) times daily before a meal. 60 tablet 11   cloNIDine (CATAPRES) 0.1 MG tablet 1 in the AM and 3 PM and 1 extra daily as needed for anxiety 270 tablet 0   clopidogrel (PLAVIX) 75 MG tablet TAKE 1 TABLET BY MOUTH EVERY DAY WITH BREAKFAST 90 tablet 3   Continuous Glucose Receiver (DEXCOM G6 RECEIVER) DEVI Use as directed 1 each 0   Continuous Glucose Sensor (DEXCOM G6 SENSOR) MISC USE AS DIRECTED 3 each  1   Continuous Glucose Transmitter (DEXCOM G6 TRANSMITTER) MISC USE AS DIRECTED 1 each 1   FARXIGA 10 MG TABS tablet TAKE 1 TABLET BY MOUTH DAILY BEFORE BREAKFAST 30 tablet 11   insulin degludec (TRESIBA FLEXTOUCH) 100 UNIT/ML FlexTouch Pen Inject 12 Units into the skin daily. 3 mL 5   isosorbide mononitrate (IMDUR) 120 MG 24 hr tablet TAKE 1 TABLET BY MOUTH EVERY DAY 90 tablet 3   JANUVIA 50 MG tablet TAKE 1 TABLET BY MOUTH EVERY DAY 90 tablet 1   lisinopril (ZESTRIL) 5 MG tablet TAKE 1 TABLET (5 MG TOTAL) BY MOUTH DAILY. 90 tablet 1   metoprolol succinate (TOPROL-XL) 25 MG 24 hr tablet TAKE 1 TABLET BY MOUTH EVERY DAY *KEEP APPOINTMENT FOR REFILLS* 90 tablet 3   mupirocin ointment (BACTROBAN) 2 % Apply 1 application. topically 2 (two) times daily. 30 g 2   nitroGLYCERIN (NITROSTAT) 0.4 MG SL tablet PLACE 1 TABLET UNDER THE TONGUE EVERY 5 MINUTES AS NEEDED FOR CHEST PAIN. MAX 3. CALL 911 25 tablet 3   OLANZapine (ZYPREXA) 20 MG tablet Take 1 tablet (20 mg total) by mouth at bedtime. 90 tablet 0   PARoxetine (PAXIL) 40 MG tablet Take 1 tablet (40 mg total) by mouth 2 (two) times daily. As of 04/08/22 180 tablet 0   ranolazine (RANEXA) 500 MG 12 hr tablet TAKE 1 TABLET BY MOUTH TWICE A DAY 180 tablet 3   XIIDRA 5 % SOLN Place 1 drop into both eyes in the morning and at bedtime.     No current facility-administered medications for this visit.    Medication Side Effects: ? 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   Qualifier: Diagnosis of  By: Cheri Guppy     Diabetes mellitus type 2 in nonobese (HCC) 07/2016   Dyslipidemia    Headache(784.0)    History of pulmonary embolism 07/08/2020   HTN  (hypertension)    Migraine    NSTEMI (non-ST elevated myocardial infarction) (HCC)    PAD (peripheral artery disease) (HCC)    Pulmonary embolus (HCC)    unprovoked    Family History  Problem Relation Age of Onset   Lupus Mother    Heart attack Father    Diabetes Father    Hypertension Father    Diabetes Paternal Grandmother    Hypertension Paternal Grandmother    Stroke Neg Hx    Kidney disease Neg Hx    Hyperlipidemia Neg Hx    Sudden death Neg Hx     Social History   Socioeconomic History   Marital status: Legally Separated    Spouse name: Not on file  Number of children: Not on file   Years of education: Not on file   Highest education level: Some college, no degree  Occupational History   Occupation: Armed forces training and education officer  Tobacco Use   Smoking status: Former    Years: 25    Types: Cigarettes    Quit date: 09/12/2021    Years since quitting: 1.2   Smokeless tobacco: Never   Tobacco comments:    0.5 pack cigarettes  per month per patient   Vaping Use   Vaping Use: Never used  Substance and Sexual Activity   Alcohol use: No    Alcohol/week: 0.0 standard drinks of alcohol   Drug use: Never   Sexual activity: Yes    Partners: Male    Comment: married  Other Topics Concern   Not on file  Social History Narrative   Regular exercise:  3 days weekly   Caffeine Use:  1 soda daily   One child biological daughter born in 23 and an adopted niece.   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          Social Determinants of Health   Financial Resource Strain: Medium Risk (10/05/2022)   Overall Financial Resource Strain (CARDIA)    Difficulty of Paying Living Expenses: Somewhat hard  Food Insecurity: Food Insecurity Present (10/05/2022)   Hunger Vital Sign    Worried About Running Out of Food in the Last Year: Sometimes true    Ran Out of Food in the Last Year: Sometimes true  Transportation Needs: No Transportation Needs (10/05/2022)    PRAPARE - Administrator, Civil Service (Medical): No    Lack of Transportation (Non-Medical): No  Physical Activity: Insufficiently Active (10/05/2022)   Exercise Vital Sign    Days of Exercise per Week: 3 days    Minutes of Exercise per Session: 20 min  Stress: Stress Concern Present (10/05/2022)   [employer] of Occupational Health - Occupational Stress Questionnaire    Feeling of Stress : Rather much  Social Connections: Unknown (10/05/2022)   Social Connection and Isolation Panel [NHANES]    Frequency of Communication with Friends and Family: Twice a week    Frequency of Social Gatherings with Friends and Family: Once a week    Attends Religious Services: Patient declined    Database administrator or Organizations: No    Attends Engineer, structural: Not on file    Marital Status: Married  Catering manager Violence: Not on file    Past Medical History, Surgical history, Social history, and Family history were reviewed and updated as appropriate.   Please see review of systems for further details on the patient's review from today.   Objective:   Physical Exam:  LMP 11/28/2009   Physical Exam Constitutional:      General: She is not in acute distress.    Appearance: She is well-developed.  Musculoskeletal:        General: No deformity.  Neurological:     Mental Status: She is alert and oriented to person, place, and time.     Coordination: Coordination normal.  Psychiatric:        Attention and Perception: She is attentive. She does not perceive auditory hallucinations.        Mood and Affect: Mood is anxious and depressed. Affect is not labile, blunt, angry or tearful.        Speech: Speech normal. Speech is not rapid and pressured.  Behavior: Behavior normal. Behavior is not agitated.        Thought Content: Thought content normal. Thought content is not paranoid or delusional. Thought content does not include homicidal or suicidal  ideation.        Cognition and Memory: Cognition normal.        Judgment: Judgment normal.     Comments: Insight intact. No auditory or visual hallucinations. No delusions.  She is worse again with panic over anxiety     Lab Review:     Component Value Date/Time   NA 137 11/30/2022 1351   NA 141 07/23/2020 1300   K 4.3 11/30/2022 1351   CL 106 11/30/2022 1351   CO2 25 11/30/2022 1351   GLUCOSE 99 11/30/2022 1351   BUN 24 (H) 11/30/2022 1351   BUN 7 07/23/2020 1300   CREATININE 1.36 (H) 11/30/2022 1351   CREATININE 0.72 06/22/2012 0840   CALCIUM 9.4 11/30/2022 1351   PROT 6.9 11/30/2022 1351   PROT 6.6 03/29/2022 1504   ALBUMIN 3.9 11/30/2022 1351   ALBUMIN 4.6 03/29/2022 1504   AST 17 11/30/2022 1351   ALT 18 11/30/2022 1351   ALKPHOS 95 11/30/2022 1351   BILITOT 0.7 11/30/2022 1351   BILITOT 0.4 03/29/2022 1504   GFRNONAA 45 (L) 11/30/2022 1351   GFRNONAA >89 06/22/2012 0840   GFRAA 102 07/23/2020 1300   GFRAA >89 06/22/2012 0840       Component Value Date/Time   WBC 7.8 11/30/2022 1351   RBC 3.87 11/30/2022 1351   HGB 11.5 (L) 11/30/2022 1351   HCT 34.9 (L) 11/30/2022 1351   PLT 164 11/30/2022 1351   MCV 90.2 11/30/2022 1351   MCH 29.7 11/30/2022 1351   MCHC 33.0 11/30/2022 1351   RDW 13.9 11/30/2022 1351   LYMPHSABS 2.2 02/25/2022 0950   MONOABS 0.4 02/25/2022 0950   EOSABS 0.3 02/25/2022 0950   BASOSABS 0.0 02/25/2022 0950    No results found for: "POCLITH", "LITHIUM"   No results found for: "PHENYTOIN", "PHENOBARB", "VALPROATE", "CBMZ"   .res Assessment: Plan:    Bailey Greene was seen today for follow-up, depression and anxiety.  Diagnoses and all orders for this visit:  Moderate mixed bipolar I disorder (HCC)  Panic disorder with agoraphobia  Generalized anxiety disorder  Insomnia due to mental condition  Chronic work stress  30 min face to face time with patient was spent on counseling and coordination of care. We discussed the  following: Having too much panic to work.  Ongoing.     Repeated bouts of STD DT anxiety and depression. However she is more anxious and depressed with panic lately affecting work.    Continue olanzapine 20 mg 3 hours before HS. Stoppped Depakote ER without difficulty.  Consider option of Abilify aug but might not sleep if olanazaine stopped.  Off label clonidine 0.1 mg BID for severe anxiety and panic has helped reduce anxiety by over 50% Can't increase clonidine bc BP borderline wihtout  SX.  Push fluids.  paroxetine to 60 mg daily as of 04/08/22 for worsening panic helped until lately.  Disc risk high dose Continue high dose for another month or so bc might need more time to get max bnefit.  paroxetine off label DT TRD and TR anxiety to 80 mg daily.  BC failure of alternatives, alprazolam 0.5 mg prn.  Alternatives clonazepam, Ativan  Consider switch to sertraline.  Discussed potential metabolic side effects associated with atypical antipsychotics, as well as potential risk for movement side  effects. Advised pt to contact office if movement side effects occur.  Metabolic side effects are unlikely at this low dose of quetiapine and almost no risk of EPS at this low-dose. DM managed  We discussed the short-term risks associated with benzodiazepines including sedation and increased fall risk among others.  Discussed long-term side effect risk including dependence, potential withdrawal symptoms, and the potential eventual dose-related risk of dementia.  But recent studies from 2020 dispute this association between benzodiazepines and dementia risk. Newer studies in 2020 do not support an association with dementia. If too sleepy with it call and will call if needed to switch to lorazepam.  continue counseling refer to [facility] 16 min: on RTW fears. Anxiety and managing panic and dpression in the work place.  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   Disc relaxation, social and other behavioral strategies to manage.  Also now facing leg surgery for claudication.  Continue FMLA for 8 weeks more  Follow-up 1 mos  Meredith Staggers MD, DFAPA  Please see After Visit Summary for patient specific instructions.   Future Appointments  Date Time Provider Department Center  12/10/2022 12:00 PM Deneise Lever, LMFT [facility] None  12/14/2022  2:20 PM Jodelle Gross, NP [facility] None  01/04/2023  1:00 PM Sandford Craze, NP [facility]    No orders of the defined types were placed in this encounter.      -------------------------------

## 2022-12-06 NOTE — Telephone Encounter (Signed)
Spoke with patient regarding arrival time change to 0630 AM for surgery on 7/10 at [facility]. Patient voiced understanding.

## 2022-12-07 NOTE — Progress Notes (Signed)
Patient was called to inform that the surgery time for tomorrow was changed to 08:30 o'clock. Patient was instructed to be at the hospital at 06:30 o'clock. Patient verbalized understanding.

## 2022-12-08 ENCOUNTER — Inpatient Hospital Stay (HOSPITAL_COMMUNITY): Payer: 59 | Admitting: Physician Assistant

## 2022-12-08 ENCOUNTER — Encounter (HOSPITAL_COMMUNITY): Payer: Self-pay | Admitting: Vascular Surgery

## 2022-12-08 ENCOUNTER — Observation Stay (HOSPITAL_COMMUNITY)
Admission: RE | Admit: 2022-12-08 | Discharge: 2022-12-09 | Disposition: A | Discharge status: Home Health | Payer: 59 | Attending: Vascular Surgery | Admitting: Vascular Surgery

## 2022-12-08 ENCOUNTER — Encounter (HOSPITAL_COMMUNITY)
Admission: RE | Disposition: A | Discharge status: Home Health | Payer: Self-pay | Source: Home / Self Care | Attending: Vascular Surgery

## 2022-12-08 ENCOUNTER — Other Ambulatory Visit: Payer: Self-pay

## 2022-12-08 ENCOUNTER — Inpatient Hospital Stay (HOSPITAL_COMMUNITY): Payer: 59

## 2022-12-08 ENCOUNTER — Inpatient Hospital Stay (HOSPITAL_COMMUNITY): Payer: 59 | Admitting: Vascular Surgery

## 2022-12-08 DIAGNOSIS — Z951 Presence of aortocoronary bypass graft: Secondary | ICD-10-CM | POA: Insufficient documentation

## 2022-12-08 DIAGNOSIS — I70293 Other atherosclerosis of native arteries of extremities, bilateral legs: Secondary | ICD-10-CM | POA: Diagnosis not present

## 2022-12-08 DIAGNOSIS — I350 Nonrheumatic aortic (valve) stenosis: Secondary | ICD-10-CM | POA: Insufficient documentation

## 2022-12-08 DIAGNOSIS — I70213 Atherosclerosis of native arteries of extremities with intermittent claudication, bilateral legs: Secondary | ICD-10-CM

## 2022-12-08 DIAGNOSIS — E119 Type 2 diabetes mellitus without complications: Secondary | ICD-10-CM | POA: Insufficient documentation

## 2022-12-08 DIAGNOSIS — Z87891 Personal history of nicotine dependence: Secondary | ICD-10-CM | POA: Diagnosis not present

## 2022-12-08 DIAGNOSIS — I25119 Atherosclerotic heart disease of native coronary artery with unspecified angina pectoris: Secondary | ICD-10-CM | POA: Diagnosis not present

## 2022-12-08 DIAGNOSIS — I10 Essential (primary) hypertension: Secondary | ICD-10-CM | POA: Insufficient documentation

## 2022-12-08 DIAGNOSIS — I7409 Other arterial embolism and thrombosis of abdominal aorta: Secondary | ICD-10-CM | POA: Diagnosis not present

## 2022-12-08 DIAGNOSIS — I708 Atherosclerosis of other arteries: Secondary | ICD-10-CM

## 2022-12-08 DIAGNOSIS — I700 Atherosclerosis of aorta: Secondary | ICD-10-CM | POA: Diagnosis not present

## 2022-12-08 DIAGNOSIS — Z86711 Personal history of pulmonary embolism: Secondary | ICD-10-CM | POA: Insufficient documentation

## 2022-12-08 DIAGNOSIS — I771 Stricture of artery: Secondary | ICD-10-CM | POA: Diagnosis not present

## 2022-12-08 DIAGNOSIS — I2510 Atherosclerotic heart disease of native coronary artery without angina pectoris: Secondary | ICD-10-CM | POA: Insufficient documentation

## 2022-12-08 HISTORY — PX: INSERTION OF ILIAC STENT: SHX6256

## 2022-12-08 HISTORY — PX: ENDARTERECTOMY FEMORAL: SHX5804

## 2022-12-08 HISTORY — PX: AORTOGRAM: SHX6300

## 2022-12-08 HISTORY — PX: ULTRASOUND GUIDANCE FOR VASCULAR ACCESS: SHX6516

## 2022-12-08 LAB — CBC
HCT: 32.2 % — ABNORMAL LOW (ref 36.0–46.0)
Hemoglobin: 10.9 g/dL — ABNORMAL LOW (ref 12.0–15.0)
MCH: 30.4 pg (ref 26.0–34.0)
MCHC: 33.9 g/dL (ref 30.0–36.0)
MCV: 89.7 fL (ref 80.0–100.0)
Platelets: 162 10*3/uL (ref 150–400)
RBC: 3.59 MIL/uL — ABNORMAL LOW (ref 3.87–5.11)
RDW: 13.6 % (ref 11.5–15.5)
WBC: 7.7 10*3/uL (ref 4.0–10.5)
nRBC: 0 % (ref 0.0–0.2)

## 2022-12-08 LAB — HEMOGLOBIN A1C
Hgb A1c MFr Bld: 6.3 % — ABNORMAL HIGH (ref 4.8–5.6)
Mean Plasma Glucose: 134.11 mg/dL

## 2022-12-08 LAB — CREATININE, SERUM
Creatinine, Ser: 0.98 mg/dL (ref 0.44–1.00)
GFR, Estimated: >60 mL/min (ref >60)

## 2022-12-08 LAB — GLUCOSE, CAPILLARY
Glucose-Capillary: 81 mg/dL (ref 70–99)
Glucose-Capillary: 146 mg/dL — ABNORMAL HIGH (ref 70–99)
Glucose-Capillary: 138 mg/dL — ABNORMAL HIGH (ref 70–99)
Glucose-Capillary: 208 mg/dL — ABNORMAL HIGH (ref 70–99)
Glucose-Capillary: 169 mg/dL — ABNORMAL HIGH (ref 70–99)

## 2022-12-08 LAB — ABO/RH: ABO/RH(D): O POS

## 2022-12-08 SURGERY — ENDARTERECTOMY, FEMORAL
Anesthesia: General | Site: Groin | Laterality: Right

## 2022-12-08 MED ORDER — ONDANSETRON HCL 4 MG/2ML IJ SOLN: 4.0000 mg | Freq: Four times a day (QID) | INTRAMUSCULAR | Status: DC | PRN

## 2022-12-08 MED ORDER — CHLORHEXIDINE GLUCONATE 0.12 % MT SOLN
OROMUCOSAL | Status: AC
  Administered 2022-12-08: 15 mL
  Filled 2022-12-08: qty 15

## 2022-12-08 MED ORDER — CLOPIDOGREL BISULFATE 75 MG PO TABS
75.0000 mg | ORAL_TABLET | Freq: Every day | ORAL | Status: DC
  Administered 2022-12-08 – 2022-12-09 (×2): 75 mg via ORAL
  Filled 2022-12-08 (×2): qty 1

## 2022-12-08 MED ORDER — OXYCODONE-ACETAMINOPHEN 5-325 MG PO TABS
1.0000 | ORAL_TABLET | ORAL | Status: DC | PRN
  Administered 2022-12-08 (×2): 2 via ORAL
  Filled 2022-12-08 (×2): qty 2

## 2022-12-08 MED ORDER — HEPARIN 6000 UNIT IRRIGATION SOLUTION
Status: AC
  Filled 2022-12-08: qty 500

## 2022-12-08 MED ORDER — ASPIRIN 81 MG PO TBEC
81.0000 mg | DELAYED_RELEASE_TABLET | Freq: Every day | ORAL | Status: DC
  Administered 2022-12-09: 81 mg via ORAL
  Filled 2022-12-08: qty 1

## 2022-12-08 MED ORDER — LINAGLIPTIN 5 MG PO TABS
5.0000 mg | ORAL_TABLET | Freq: Every day | ORAL | Status: DC
  Administered 2022-12-08 – 2022-12-09 (×2): 5 mg via ORAL
  Filled 2022-12-08 (×2): qty 1

## 2022-12-08 MED ORDER — DEXAMETHASONE SODIUM PHOSPHATE 10 MG/ML IJ SOLN
INTRAMUSCULAR | Status: DC | PRN
  Administered 2022-12-08: 5 mg via INTRAVENOUS

## 2022-12-08 MED ORDER — MIDAZOLAM HCL 2 MG/2ML IJ SOLN
INTRAMUSCULAR | Status: AC
  Filled 2022-12-08: qty 2

## 2022-12-08 MED ORDER — LIFITEGRAST 5 % OP SOLN: 1.0000 [drp] | Freq: Two times a day (BID) | OPHTHALMIC | Status: DC

## 2022-12-08 MED ORDER — NITROGLYCERIN 0.4 MG SL SUBL: 0.4000 mg | SUBLINGUAL_TABLET | SUBLINGUAL | Status: DC | PRN

## 2022-12-08 MED ORDER — GUAIFENESIN-DM 100-10 MG/5ML PO SYRP: 15.0000 mL | ORAL_SOLUTION | ORAL | Status: DC | PRN

## 2022-12-08 MED ORDER — HYDROMORPHONE HCL 1 MG/ML IJ SOLN
INTRAMUSCULAR | Status: AC
  Filled 2022-12-08: qty 1

## 2022-12-08 MED ORDER — LISINOPRIL 5 MG PO TABS
5.0000 mg | ORAL_TABLET | Freq: Every day | ORAL | Status: DC
  Administered 2022-12-08: 5 mg via ORAL
  Filled 2022-12-08 (×2): qty 1

## 2022-12-08 MED ORDER — LIDOCAINE 2% (20 MG/ML) 5 ML SYRINGE
INTRAMUSCULAR | Status: DC | PRN
  Administered 2022-12-08: 20 mg via INTRAVENOUS

## 2022-12-08 MED ORDER — PROTAMINE SULFATE 10 MG/ML IV SOLN
INTRAVENOUS | Status: DC | PRN
  Administered 2022-12-08: 10 mg via INTRAVENOUS

## 2022-12-08 MED ORDER — LACTATED RINGERS IV SOLN: INTRAVENOUS | Status: DC | PRN

## 2022-12-08 MED ORDER — HEPARIN SODIUM (PORCINE) 1000 UNIT/ML IJ SOLN
INTRAMUSCULAR | Status: DC | PRN
  Administered 2022-12-08: 6000 [IU] via INTRAVENOUS

## 2022-12-08 MED ORDER — IODIXANOL 320 MG/ML IV SOLN
INTRAVENOUS | Status: DC | PRN
  Administered 2022-12-08: 40 mL via INTRA_ARTERIAL

## 2022-12-08 MED ORDER — LABETALOL HCL 5 MG/ML IV SOLN
INTRAVENOUS | Status: DC | PRN
  Administered 2022-12-08: 5 mg via INTRAVENOUS
  Administered 2022-12-08: 2.5 mg via INTRAVENOUS

## 2022-12-08 MED ORDER — OXYCODONE HCL 5 MG PO TABS: 5.0000 mg | ORAL_TABLET | Freq: Once | ORAL | Status: DC | PRN

## 2022-12-08 MED ORDER — HYDRALAZINE HCL 20 MG/ML IJ SOLN: 5.0000 mg | INTRAMUSCULAR | Status: DC | PRN

## 2022-12-08 MED ORDER — HEPARIN 6000 UNIT IRRIGATION SOLUTION
Status: DC | PRN
  Administered 2022-12-08: 1

## 2022-12-08 MED ORDER — HYDROMORPHONE HCL 1 MG/ML IJ SOLN: 0.5000 mg | INTRAMUSCULAR | Status: DC | PRN

## 2022-12-08 MED ORDER — PHENYLEPHRINE HCL-NACL 20-0.9 MG/250ML-% IV SOLN
INTRAVENOUS | Status: DC | PRN
  Administered 2022-12-08: 40 ug/min via INTRAVENOUS

## 2022-12-08 MED ORDER — SUGAMMADEX SODIUM 200 MG/2ML IV SOLN
INTRAVENOUS | Status: DC | PRN
  Administered 2022-12-08: 116.2 mg via INTRAVENOUS

## 2022-12-08 MED ORDER — ALPRAZOLAM 0.5 MG PO TABS: 0.5000 mg | ORAL_TABLET | Freq: Three times a day (TID) | ORAL | Status: DC | PRN

## 2022-12-08 MED ORDER — ISOSORBIDE MONONITRATE ER 60 MG PO TB24
120.0000 mg | ORAL_TABLET | Freq: Every day | ORAL | Status: DC
  Administered 2022-12-08 – 2022-12-09 (×2): 120 mg via ORAL
  Filled 2022-12-08 (×2): qty 2

## 2022-12-08 MED ORDER — POTASSIUM CHLORIDE CRYS ER 20 MEQ PO TBCR: 20.0000 meq | EXTENDED_RELEASE_TABLET | Freq: Every day | ORAL | Status: DC | PRN

## 2022-12-08 MED ORDER — PANTOPRAZOLE SODIUM 40 MG PO TBEC
40.0000 mg | DELAYED_RELEASE_TABLET | Freq: Every day | ORAL | Status: DC
  Administered 2022-12-09: 40 mg via ORAL
  Filled 2022-12-08: qty 1

## 2022-12-08 MED ORDER — SODIUM CHLORIDE 0.9 % IV SOLN: INTRAVENOUS | Status: DC

## 2022-12-08 MED ORDER — PROPOFOL 10 MG/ML IV BOLUS
INTRAVENOUS | Status: DC | PRN
  Administered 2022-12-08: 150 mg via INTRAVENOUS

## 2022-12-08 MED ORDER — INSULIN GLARGINE-YFGN 100 UNIT/ML ~~LOC~~ SOLN
12.0000 [IU] | Freq: Every day | SUBCUTANEOUS | Status: DC
  Administered 2022-12-08 – 2022-12-09 (×2): 12 [IU] via SUBCUTANEOUS
  Filled 2022-12-08 (×2): qty 0.12

## 2022-12-08 MED ORDER — ONDANSETRON HCL 4 MG/2ML IJ SOLN
INTRAMUSCULAR | Status: DC | PRN
  Administered 2022-12-08: 4 mg via INTRAVENOUS

## 2022-12-08 MED ORDER — HEPARIN SODIUM (PORCINE) 5000 UNIT/ML IJ SOLN
5000.0000 [IU] | Freq: Three times a day (TID) | INTRAMUSCULAR | Status: DC
  Administered 2022-12-08 – 2022-12-09 (×2): 5000 [IU] via SUBCUTANEOUS
  Filled 2022-12-08 (×2): qty 1

## 2022-12-08 MED ORDER — PAROXETINE HCL 20 MG PO TABS
40.0000 mg | ORAL_TABLET | Freq: Two times a day (BID) | ORAL | Status: DC
  Administered 2022-12-08: 40 mg via ORAL
  Filled 2022-12-08 (×2): qty 2

## 2022-12-08 MED ORDER — CHLORHEXIDINE GLUCONATE CLOTH 2 % EX PADS: 6.0000 | MEDICATED_PAD | Freq: Once | CUTANEOUS | Status: DC

## 2022-12-08 MED ORDER — LABETALOL HCL 5 MG/ML IV SOLN: 10.0000 mg | INTRAVENOUS | Status: DC | PRN

## 2022-12-08 MED ORDER — ACETAMINOPHEN 325 MG RE SUPP: 325.0000 mg | RECTAL | Status: DC | PRN

## 2022-12-08 MED ORDER — ROCURONIUM BROMIDE 10 MG/ML (PF) SYRINGE
PREFILLED_SYRINGE | INTRAVENOUS | Status: DC | PRN
  Administered 2022-12-08: 60 mg via INTRAVENOUS

## 2022-12-08 MED ORDER — BUSPIRONE HCL 15 MG PO TABS
30.0000 mg | ORAL_TABLET | Freq: Every day | ORAL | Status: DC
  Administered 2022-12-08: 30 mg via ORAL
  Filled 2022-12-08: qty 2
  Filled 2022-12-08: qty 6
  Filled 2022-12-08: qty 2
  Filled 2022-12-08: qty 6

## 2022-12-08 MED ORDER — MEPERIDINE HCL 25 MG/ML IJ SOLN: 6.2500 mg | INTRAMUSCULAR | Status: DC | PRN

## 2022-12-08 MED ORDER — CLONIDINE HCL 0.1 MG PO TABS: 0.1000 mg | ORAL_TABLET | Freq: Two times a day (BID) | ORAL | Status: DC | PRN

## 2022-12-08 MED ORDER — CEFAZOLIN SODIUM-DEXTROSE 2-4 GM/100ML-% IV SOLN
2.0000 g | Freq: Three times a day (TID) | INTRAVENOUS | Status: AC
  Administered 2022-12-08 – 2022-12-09 (×2): 2 g via INTRAVENOUS
  Filled 2022-12-08 (×2): qty 100

## 2022-12-08 MED ORDER — ATORVASTATIN CALCIUM 80 MG PO TABS
80.0000 mg | ORAL_TABLET | Freq: Every day | ORAL | Status: DC
  Administered 2022-12-08 – 2022-12-09 (×2): 80 mg via ORAL
  Filled 2022-12-08 (×2): qty 1

## 2022-12-08 MED ORDER — DAPAGLIFLOZIN PROPANEDIOL 10 MG PO TABS
10.0000 mg | ORAL_TABLET | Freq: Every day | ORAL | Status: DC
  Administered 2022-12-09: 10 mg via ORAL
  Filled 2022-12-08: qty 1

## 2022-12-08 MED ORDER — CEFAZOLIN SODIUM-DEXTROSE 2-4 GM/100ML-% IV SOLN
2.0000 g | INTRAVENOUS | Status: AC
  Administered 2022-12-08: 2 g via INTRAVENOUS
  Filled 2022-12-08: qty 100

## 2022-12-08 MED ORDER — INSULIN ASPART 100 UNIT/ML IJ SOLN
0.0000 [IU] | Freq: Three times a day (TID) | INTRAMUSCULAR | Status: DC
  Administered 2022-12-08: 3 [IU] via SUBCUTANEOUS
  Administered 2022-12-09: 1 [IU] via SUBCUTANEOUS

## 2022-12-08 MED ORDER — MIDAZOLAM HCL 2 MG/2ML IJ SOLN: 0.5000 mg | Freq: Once | INTRAMUSCULAR | Status: DC | PRN

## 2022-12-08 MED ORDER — METOPROLOL SUCCINATE ER 25 MG PO TB24
25.0000 mg | ORAL_TABLET | Freq: Every day | ORAL | Status: DC
  Filled 2022-12-08: qty 1

## 2022-12-08 MED ORDER — OXYCODONE HCL 5 MG/5ML PO SOLN: 5.0000 mg | Freq: Once | ORAL | Status: DC | PRN

## 2022-12-08 MED ORDER — ACETAMINOPHEN 325 MG PO TABS: 325.0000 mg | ORAL_TABLET | ORAL | Status: DC | PRN

## 2022-12-08 MED ORDER — ALUM & MAG HYDROXIDE-SIMETH 200-200-20 MG/5ML PO SUSP: 15.0000 mL | ORAL | Status: DC | PRN

## 2022-12-08 MED ORDER — DOCUSATE SODIUM 100 MG PO CAPS
100.0000 mg | ORAL_CAPSULE | Freq: Every day | ORAL | Status: DC
  Filled 2022-12-08: qty 1

## 2022-12-08 MED ORDER — SODIUM CHLORIDE 0.9 % IV SOLN: 500.0000 mL | Freq: Once | INTRAVENOUS | Status: DC | PRN

## 2022-12-08 MED ORDER — RANOLAZINE ER 500 MG PO TB12
500.0000 mg | ORAL_TABLET | Freq: Two times a day (BID) | ORAL | Status: DC
  Administered 2022-12-08: 500 mg via ORAL
  Filled 2022-12-08 (×2): qty 1

## 2022-12-08 MED ORDER — METOPROLOL TARTRATE 5 MG/5ML IV SOLN: 2.0000 mg | INTRAVENOUS | Status: DC | PRN

## 2022-12-08 MED ORDER — PHENOL 1.4 % MT LIQD: 1.0000 | OROMUCOSAL | Status: DC | PRN

## 2022-12-08 MED ORDER — 0.9 % SODIUM CHLORIDE (POUR BTL) OPTIME
TOPICAL | Status: DC | PRN
  Administered 2022-12-08: 2000 mL

## 2022-12-08 MED ORDER — ACETAMINOPHEN 500 MG PO TABS
1000.0000 mg | ORAL_TABLET | Freq: Once | ORAL | Status: AC
  Administered 2022-12-08: 1000 mg via ORAL
  Filled 2022-12-08: qty 2

## 2022-12-08 MED ORDER — OLANZAPINE 10 MG PO TABS
20.0000 mg | ORAL_TABLET | Freq: Every day | ORAL | Status: DC
  Administered 2022-12-08: 20 mg via ORAL
  Filled 2022-12-08: qty 2

## 2022-12-08 MED ORDER — BISACODYL 5 MG PO TBEC: 5.0000 mg | DELAYED_RELEASE_TABLET | Freq: Every day | ORAL | Status: DC | PRN

## 2022-12-08 MED ORDER — CILOSTAZOL 100 MG PO TABS
100.0000 mg | ORAL_TABLET | Freq: Two times a day (BID) | ORAL | Status: DC
  Administered 2022-12-08 – 2022-12-09 (×2): 100 mg via ORAL
  Filled 2022-12-08 (×2): qty 1

## 2022-12-08 MED ORDER — MAGNESIUM SULFATE 2 GM/50ML IV SOLN: 2.0000 g | Freq: Every day | INTRAVENOUS | Status: DC | PRN

## 2022-12-08 MED ORDER — MUPIROCIN 2 % EX OINT
1.0000 | TOPICAL_OINTMENT | Freq: Two times a day (BID) | CUTANEOUS | Status: DC
  Administered 2022-12-08 – 2022-12-09 (×2): 1 via TOPICAL
  Filled 2022-12-08: qty 22

## 2022-12-08 MED ORDER — OXYCODONE-ACETAMINOPHEN 5-325 MG PO TABS
ORAL_TABLET | ORAL | Status: AC
  Administered 2022-12-08: 1
  Filled 2022-12-08: qty 2

## 2022-12-08 MED ORDER — SENNOSIDES-DOCUSATE SODIUM 8.6-50 MG PO TABS: 1.0000 | ORAL_TABLET | Freq: Every evening | ORAL | Status: DC | PRN

## 2022-12-08 MED ORDER — FENTANYL CITRATE (PF) 250 MCG/5ML IJ SOLN
INTRAMUSCULAR | Status: AC
  Filled 2022-12-08: qty 5

## 2022-12-08 MED ORDER — FENTANYL CITRATE (PF) 250 MCG/5ML IJ SOLN
INTRAMUSCULAR | Status: DC | PRN
  Administered 2022-12-08: 150 ug via INTRAVENOUS
  Administered 2022-12-08 (×2): 50 ug via INTRAVENOUS

## 2022-12-08 MED ORDER — HYDROMORPHONE HCL 1 MG/ML IJ SOLN
0.2500 mg | INTRAMUSCULAR | Status: DC | PRN
  Administered 2022-12-08 (×3): 0.5 mg via INTRAVENOUS

## 2022-12-08 MED ORDER — MIDAZOLAM HCL 2 MG/2ML IJ SOLN
INTRAMUSCULAR | Status: DC | PRN
  Administered 2022-12-08: 2 mg via INTRAVENOUS

## 2022-12-08 SURGICAL SUPPLY — 79 items
APL PRP STRL LF DISP 70% ISPRP (MISCELLANEOUS) ×4
APL SKNCLS STERI-STRIP NONHPOA (GAUZE/BANDAGES/DRESSINGS) ×8
BAG COUNTER SPONGE SURGICOUNT (BAG) ×5 IMPLANT
BAG SNAP BAND KOVER 36X36 (MISCELLANEOUS) IMPLANT
BAG SPNG CNTER NS LX DISP (BAG) ×4
BENZOIN TINCTURE PRP APPL 2/3 (GAUZE/BANDAGES/DRESSINGS) ×5 IMPLANT
BOOT SUTURE VASCULAR YLW (MISCELLANEOUS) ×4
CANISTER SUCT 3000ML PPV (MISCELLANEOUS) ×5 IMPLANT
CANNULA VESSEL 3MM 2 BLNT TIP (CANNULA) ×10 IMPLANT
CATH ACCU-VU SIZ PIG 5F 70CM (CATHETERS) IMPLANT
CATH BEACON 5 .035 65 KMP TIP (CATHETERS) IMPLANT
CATH OMNI FLUSH 5F 65CM (CATHETERS) ×5 IMPLANT
CHLORAPREP W/TINT 26 (MISCELLANEOUS) ×5 IMPLANT
CLAMP SUTURE YELLOW 5 PAIRS (MISCELLANEOUS) IMPLANT
CLIP LIGATING EXTRA MED SLVR (CLIP) IMPLANT
CLIP LIGATING EXTRA SM BLUE (MISCELLANEOUS) IMPLANT
CLOSURE PERCLOSE PROSTYLE (VASCULAR PRODUCTS) IMPLANT
DRAIN CHANNEL 15F RND FF W/TCR (WOUND CARE) IMPLANT
DRAPE C-ARM 42X72 X-RAY (DRAPES) IMPLANT
DRAPE TABLE BACK 80X90 (DRAPES) IMPLANT
DRSG IV TEGADERM 3.5X4.5 STRL (GAUZE/BANDAGES/DRESSINGS) IMPLANT
DRSG TEGADERM 4X10 (GAUZE/BANDAGES/DRESSINGS) IMPLANT
ELECT REM PT RETURN 9FT ADLT (ELECTROSURGICAL) ×4
ELECTRODE REM PT RTRN 9FT ADLT (ELECTROSURGICAL) ×5 IMPLANT
EVACUATOR SILICONE 100CC (DRAIN) IMPLANT
GAUZE SPONGE 4X4 12PLY STRL (GAUZE/BANDAGES/DRESSINGS) ×5 IMPLANT
GLIDEWIRE ADV .035X260CM (WIRE) IMPLANT
GLOVE BIO SURGEON STRL SZ8 (GLOVE) ×5 IMPLANT
GOWN STRL REUS W/ TWL LRG LVL3 (GOWN DISPOSABLE) ×10 IMPLANT
GOWN STRL REUS W/ TWL XL LVL3 (GOWN DISPOSABLE) ×5 IMPLANT
GOWN STRL REUS W/TWL LRG LVL3 (GOWN DISPOSABLE) ×8
GOWN STRL REUS W/TWL XL LVL3 (GOWN DISPOSABLE) ×4
KIT BASIN OR (CUSTOM PROCEDURE TRAY) ×5 IMPLANT
KIT ENCORE 26 ADVANTAGE (KITS) IMPLANT
KIT TURNOVER KIT B (KITS) ×5 IMPLANT
NS IRRIG 1000ML POUR BTL (IV SOLUTION) ×10 IMPLANT
PACK ENDO MINOR (CUSTOM PROCEDURE TRAY) ×5 IMPLANT
PACK PERIPHERAL VASCULAR (CUSTOM PROCEDURE TRAY) ×5 IMPLANT
PAD ARMBOARD 7.5X6 YLW CONV (MISCELLANEOUS) ×10 IMPLANT
PATCH VASC XENOSURE 1CMX6CM (Vascular Products) ×4 IMPLANT
PATCH VASC XENOSURE 1X6 (Vascular Products) IMPLANT
PENCIL SMOKE EVACUATOR (MISCELLANEOUS) IMPLANT
PROTECTION STATION PRESSURIZED (MISCELLANEOUS) ×4
SET MICROPUNCTURE 5F STIFF (MISCELLANEOUS) ×5 IMPLANT
SET WALTER ACTIVATION W/DRAPE (SET/KITS/TRAYS/PACK) ×5 IMPLANT
SHEATH BRITE TIP 7FR 35CM (SHEATH) IMPLANT
SHEATH BRITE TIP 8FR 23CM (SHEATH) IMPLANT
SHEATH PINNACLE 5F 10CM (SHEATH) ×5 IMPLANT
SHEATH PINNACLE 8F 10CM (SHEATH) ×5 IMPLANT
SHEATH PROBE COVER 6X72 (BAG) IMPLANT
STATION PROTECTION PRESSURIZED (MISCELLANEOUS) IMPLANT
STENT VIABAHN 39XCATH 135 (Permanent Stent) IMPLANT
STENT VIABAHN 8X39 7FR 135 (Permanent Stent) ×4 IMPLANT
STENT VIABAHN 9X29X80 VBX (Permanent Stent) IMPLANT
STOPCOCK MORSE 400PSI 3WAY (MISCELLANEOUS) ×5 IMPLANT
STRIP CLOSURE SKIN 1/2X4 (GAUZE/BANDAGES/DRESSINGS) ×10 IMPLANT
SUT ETHILON 3 0 PS 1 (SUTURE) IMPLANT
SUT MNCRL AB 4-0 PS2 18 (SUTURE) ×5 IMPLANT
SUT PROLENE 5 0 C 1 24 (SUTURE) ×5 IMPLANT
SUT PROLENE 6 0 BV (SUTURE) ×5 IMPLANT
SUT VIC AB 2-0 CT1 27 (SUTURE) ×4
SUT VIC AB 2-0 CT1 TAPERPNT 27 (SUTURE) ×5 IMPLANT
SUT VIC AB 3-0 SH 27 (SUTURE) ×4
SUT VIC AB 3-0 SH 27X BRD (SUTURE) ×5 IMPLANT
SYR 10ML LL (SYRINGE) IMPLANT
SYR 20ML LL LF (SYRINGE) IMPLANT
SYR 30ML LL (SYRINGE) IMPLANT
SYR MEDRAD MARK V 150ML (SYRINGE) ×5 IMPLANT
TAG SUTURE CLAMP YLW 5PR (MISCELLANEOUS) ×4
TOWEL GREEN STERILE (TOWEL DISPOSABLE) ×10 IMPLANT
TOWEL GREEN STERILE FF (TOWEL DISPOSABLE) ×5 IMPLANT
TUBING HIGH PRESSURE 120CM (CONNECTOR) ×5 IMPLANT
TUBING INJECTOR 48 (MISCELLANEOUS) IMPLANT
UNDERPAD 30X36 HEAVY ABSORB (UNDERPADS AND DIAPERS) ×5 IMPLANT
WATER STERILE IRR 1000ML POUR (IV SOLUTION) ×5 IMPLANT
WIRE AMPLATZ SS-J .035X180CM (WIRE) IMPLANT
WIRE AMPLATZ SS-J .035X260CM (WIRE) IMPLANT
WIRE BENTSON .035X145CM (WIRE) ×5 IMPLANT
WIRE TORQFLEX AUST .018X40CM (WIRE) IMPLANT

## 2022-12-08 NOTE — Op Note (Signed)
DATE OF SERVICE: 12/08/2022  PATIENT:  Bailey Greene  62 y.o. 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 My assistant helped with exposure through counter tension, suctioning, ligation and retraction to better visualize the surgical field.  My assistant expedited sewing during the case by following my sutures. Wherever I use the term "we" in the report, my assistant actively helped me with that portion of the procedure.  ANESTHESIA:   general  EBL:  BLOOD ADMINISTERED:none  DRAINS: none   LOCAL MEDICATIONS USED:  NONE  SPECIMEN:  none  COUNTS: confirmed correct.  TOURNIQUET:  none  PATIENT DISPOSITION:  PACU - hemodynamically stable.   Delay start of Pharmacological VTE agent (>24hrs) due to surgical blood loss or risk of bleeding: no  INDICATION FOR PROCEDURE: Bailey Greene is a 62 y.o. female with bilateral lower extremity claudication.  Preoperative CT angiogram showed bilateral common iliac artery stenosis and right common femoral artery stenosis. After careful discussion of risks, benefits, and alternatives the patient was offered bilateral common iliac artery stenting and right femoral endarterectomy. The patient understood and wished to proceed.  OPERATIVE FINDINGS:  Successful bilateral common iliac artery stenting. Successful  femoral endarterectomy Brisk Doppler flow distal to the repair at completion of case.  DESCRIPTION OF PROCEDURE: After identification of the patient in the pre-operative holding area, the patient was transferred to the operating room. The patient was positioned supine on the operating room table. Anesthesia was induced. The abdomen, groins, and thighs were prepped and draped in standard fashion. A surgical pause was performed confirming correct patient, procedure, and operative location.  Intraoperative ultrasound was used to map the right common femoral artery and its bifurcation.  A an oblique incision was planned over the right groin to allow for femoral endarterectomy.  This was made with a 10 blade.  The incision was carried down through the subcutaneous tissue with Bovie electrocautery until the femoral sheath was encountered.  The femoral sheath was entered carefully.  The common femoral artery and its branches were carefully skeletonized.  Silastic Vesseloops were used to encircle the external iliac artery, circumflex iliac artery, profunda femoris artery, and superficial femoral artery on the right.  Ultrasound guidance was used to obtain micropuncture access in the left common femoral artery.  An 018 wire was navigated into the common iliac artery without difficulty.  A micro sheath was introduced into the common femoral artery.  An 27 Glidewire was navigated into the aorta without difficulty.  Access was upsized to a 7 [place] sheath.  Micropuncture access was obtained in the right common femoral artery via direct puncture.  The 088 wire was easily navigated to the common iliac artery.  The micro sheath was introduced into the common femoral artery.  An 4 Glidewire was navigated into the aorta without difficulty.  Access was upsized to an 8 [place] sheath.  The patient was systemically heparinized.  Activated clotting time measurements were  used throughout the case to confirm adequate  anticoagulation.  Over the right sided Glidewire and Omni Flush catheter was advanced into the infrarenal aorta.  An aortogram was performed.  The aortic bifurcation anatomy was marked.  Sheaths were driven across the proximal common iliac artery to protect the planned stents.  On the left an 8 x 39 mm VBX stent was positioned across the orifice of the left common iliac artery.  On the right an 8 x 29 mm VBX stent was positioned across the orifice of the right common iliac artery.  The sheaths were withdrawn.  Both stents were deployed simultaneously to nominal pressure.  Both stent systems were then removed.  Over the wire the Omni Flush catheter was again advanced to the infrarenal aorta.  Completion angiogram was performed.  Good technical result was obtained from stenting with resolution of bilateral common iliac artery stenosis.  On the left all endovascular equipment was removed.  A Perclose device was used to close the arteriotomy.  Good hemostasis was achieved.  On a vascular equipment was removed from the right.  Clamps were applied to the right external iliac artery, profunda femoris artery, superficial femoral artery.  The existing arteriotomy on the right common femoral artery was extended with Potts scissors proximally and distally.  Severe exophytic plaque was demonstrated in the lumen of the artery.  A Freer dissector was used to perform femoral endarterectomy.  Good technical result was achieved.  Brisk inflow was achieved.  Brisk backbleeding was achieved from the profunda femoris artery.  The superficial femoral artery was chronically occluded.  I did debulk as much of the proximal stenosis to allow endovascular intervention in the future.  A bovine pericardial patch was prepared per manufacturer's instructions and brought in the field for use as a patch angioplasty.  The patch was sewn to the common femoral arteriotomy using continuous running suture of 5-0 Prolene.  Immediately prior to  completion the patch was flushed and de-aired.  Clamps were released on the outflow in the patch pressurized.  Clamps were released on the inflow.  Hemostasis was confirmed in the patch.  A Doppler machine was used to evaluate the repair.  Postoperative flow was heard in the profunda femoris artery and in the common femoral artery.  Satisfied we ended the case here.  Heparin was reversed with protamine.  The wound in the right groin was closed in layers using 2-0 Vicryl, 3-0 Vicryl, 4 Monocryl.  Steri-Strips were applied.  A clean bandage was applied.  Upon completion of the case instrument and sharps counts were confirmed correct. The patient was transferred to the  PACU in good condition. I was present for all portions of the procedure.  FOLLOW UP PLAN: Assuming a normal postoperative course, I will see the patient in 4 weeks with an ABI.   [name]. [name], MD [facility] Phone Number: [phone] 12/08/2022 10:39 AM

## 2022-12-08 NOTE — Anesthesia Procedure Notes (Signed)
Procedure Name: Intubation Date/Time: 12/08/2022 8:45 AM  Performed by: Loleta Tyron Manetta, CRNAPre-anesthesia Checklist: Patient identified, Patient being monitored, Timeout performed, Emergency Drugs available and Suction available Patient Re-evaluated:Patient Re-evaluated prior to induction Oxygen Delivery Method: Circle system utilized Preoxygenation: Pre-oxygenation with 100% oxygen Induction Type: IV induction Ventilation: Mask ventilation without difficulty Laryngoscope Size: Mac and 3 Grade View: Grade II Tube type: Oral Tube size: 7.0 mm Number of attempts: 1 Airway Equipment and Method: Stylet Placement Confirmation: ETT inserted through vocal cords under direct vision, positive ETCO2 and breath sounds checked- equal and bilateral Secured at: 22 cm Tube secured with: Tape Dental Injury: Teeth and Oropharynx as per pre-operative assessment

## 2022-12-08 NOTE — Anesthesia Postprocedure Evaluation (Signed)
Anesthesia Post Note  Patient: Bailey Greene  Procedure(s) Performed: RIGHT COMMON FEMORAL ENDARTERECTOMY (Right) INSERTION OF BILATERAL ILIAC KISSING STENTS USING VIABAHN VBX STENTS (Bilateral: Groin) ULTRASOUND GUIDANCE FOR VASCULAR ACCESS OF LEFT FEMORAL ARTERY (Left: Groin) AORTOGRAM (Groin)     Patient location during evaluation: PACU Anesthesia Type: General Level of consciousness: awake and alert, patient cooperative and oriented Pain management: pain level controlled Vital Signs Assessment: post-procedure vital signs reviewed and stable Respiratory status: spontaneous breathing, nonlabored ventilation and respiratory function stable Cardiovascular status: blood pressure returned to baseline and stable Postop Assessment: no apparent nausea or vomiting and adequate PO intake Anesthetic complications: no   No notable events documented.  Last Vitals:  Vitals:   12/08/22 1130 12/08/22 1145  BP: 98/71 113/60  Pulse: 79 65  Resp: 12 12  Temp:    SpO2: 100% 99%    Last Pain:  Vitals:   12/08/22 1145  TempSrc:   PainSc: 4     LLE Motor Response: Purposeful movement (12/08/22 1145) LLE Sensation: Full sensation (12/08/22 1145) RLE Motor Response: Purposeful movement (12/08/22 1145) RLE Sensation: Full sensation (12/08/22 1145)      Kohlton Gilpatrick,E. Shamela Haydon

## 2022-12-08 NOTE — Anesthesia Procedure Notes (Signed)
Arterial Line Insertion Start/End7/03/2023 7:10 AM, 12/08/2022 7:30 AM Performed by: Darryl Nestle, CRNA, CRNA  Patient location: Pre-op. Preanesthetic checklist: patient identified, IV checked, site marked, risks and benefits discussed, surgical consent, monitors and equipment checked, pre-op evaluation, timeout performed and anesthesia consent Lidocaine 1% used for infiltration radial was placed Catheter size: 20 G Hand hygiene performed  and maximum sterile barriers used   Attempts: 2 Procedure performed using ultrasound guided technique. Following insertion, dressing applied. Post procedure assessment: normal and unchanged

## 2022-12-08 NOTE — Progress Notes (Signed)
  Progress Note    12/08/2022 4:54 PM Day of Surgery  Subjective:  feeling a little sore in the right groin, otherwise no issues    Vitals:   12/08/22 1400 12/08/22 1434  BP: 103/64 99/64  Pulse: 68 71  Resp: 10   Temp: 97.6 F (36.4 C) 98.4 F (36.9 C)  SpO2: 98% 99%    Physical Exam: General:  resting comfortably Cardiac:  regular Lungs:  nonlabored Incisions:  right groin incision dressed and dry. Groin is soft without hematoma Extremities:  brisk DP/PT doppler signals bilaterally  CBC    Component Value Date/Time   WBC 7.7 12/08/2022 1105   RBC 3.59 (L) 12/08/2022 1105   HGB 10.9 (L) 12/08/2022 1105   HCT 32.2 (L) 12/08/2022 1105   PLT 162 12/08/2022 1105   MCV 89.7 12/08/2022 1105   MCH 30.4 12/08/2022 1105   MCHC 33.9 12/08/2022 1105   RDW 13.6 12/08/2022 1105   LYMPHSABS 2.2 02/25/2022 0950   MONOABS 0.4 02/25/2022 0950   EOSABS 0.3 02/25/2022 0950   BASOSABS 0.0 02/25/2022 0950    BMET    Component Value Date/Time   NA 137 11/30/2022 1351   NA 141 07/23/2020 1300   K 4.3 11/30/2022 1351   CL 106 11/30/2022 1351   CO2 25 11/30/2022 1351   GLUCOSE 99 11/30/2022 1351   BUN 24 (H) 11/30/2022 1351   BUN 7 07/23/2020 1300   CREATININE 0.98 12/08/2022 1105   CREATININE 0.72 06/22/2012 0840   CALCIUM 9.4 11/30/2022 1351   GFRNONAA >60 12/08/2022 1105   GFRNONAA >89 06/22/2012 0840   GFRAA 102 07/23/2020 1300   GFRAA >89 06/22/2012 0840    INR    Component Value Date/Time   INR 1.0 11/30/2022 1351     Intake/Output Summary (Last 24 hours) at 12/08/2022 1654 Last data filed at 12/08/2022 1404 Gross per 24 hour  Intake 1240 ml  Output 800 ml  Net 440 ml      Assessment/Plan:  62 y.o. female is s/p: right common femoral endarterectomy with bilateral common iliac stents  Day of Surgery   -Feeling good this afternoon. No pain in her feet or legs. Only mild soreness at the incision site -Right groin incision site intact and dry. This  area is soft without hematoma -BLE well perfused with brisk DP/PT doppler signals -Potentially home in the next few days as long as she is mobilizing well and tolerating a normal diet -Continue ASA, plavix, statin   Loel Dubonnet, PA-C [facility] [phone] 12/08/2022 4:54 PM

## 2022-12-08 NOTE — Transfer of Care (Signed)
Immediate Anesthesia Transfer of Care Note  Patient: Bailey Greene  Procedure(s) Performed: RIGHT COMMON FEMORAL ENDARTERECTOMY (Right) INSERTION OF BILATERAL ILIAC KISSING STENTS USING VIABAHN VBX STENTS (Bilateral: Groin) ULTRASOUND GUIDANCE FOR VASCULAR ACCESS OF LEFT FEMORAL ARTERY (Left: Groin) AORTOGRAM (Groin)  Patient Location: PACU  Anesthesia Type:General  Level of Consciousness: awake  Airway & Oxygen Therapy: Patient Spontanous Breathing  Post-op Assessment: Report given to RN and Post -op Vital signs reviewed and stable  Post vital signs: Reviewed and stable  Last Vitals:  Vitals Value Taken Time  BP 120/64 12/08/22 1052  Temp    Pulse 67 12/08/22 1057  Resp 10 12/08/22 1057  SpO2 100 % 12/08/22 1057  Vitals shown include unvalidated device data.  Last Pain:  Vitals:   12/08/22 0751  TempSrc:   PainSc: 0-No pain      Patients Stated Pain Goal: 0 (12/08/22 0751)  Complications: No notable events documented.

## 2022-12-08 NOTE — H&P (Signed)
See below for H&P details. Plan RCFA endarterectomy and bilateral common iliac artery stenting in OR today. Risks / benefits / alternatives reviewed. Patient is amenable to proceed.  Bailey Greene. [name], MD FACS [facility] Phone Number: [phone] 12/08/2022 8:10 AM    [facility] [place]  ASSESSMENT / PLAN: Bailey Greene is a 62 y.o. 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 Blood glucose control with goal A1c < 7%. Blood pressure control with goal blood pressure < 140/90 mmHg. Lipid reduction therapy with goal LDL-C <100 mg/dL (<69 if symptomatic from PAD).  Aspirin 81mg  PO QD.  Atorvastatin 40-80mg  PO QD (or other "high intensity" statin therapy). Daily walking to and past the point of discomfort. Patient counseled to keep a log of exercise distance.  I am suspicious she has spinal stenosis and claudication type symptoms. She has no palpable femoral pulses. Will check a CT angiogram of abdomen and pelvis with runoff. I will call her with the results.  CHIEF COMPLAINT: Peripheral arterial disease demonstrated on recent angiogram.  HISTORY OF PRESENT ILLNESS: Bailey Greene is a 62 y.o. female for to clinic after recent cardiac catheterization demonstrated calcified common femoral artery and occluded right superficial femoral artery.  The patient is relatively asymptomatic from a peripheral arterial disease standpoint.  She reports some discomfort and a sensation of "heaviness" in her calves after walking for more than 30 minutes.  She is able to do all of her independent activities of daily living without difficulty.  She can shop through a grocery store without discomfort in her  calves.  She does not report symptoms of rest pain.  She does not have any ischemic ulceration about her feet.  04/06/22: She returns for surveillance.  She thinks her symptoms are getting a bit worse.  She continues to smoke sporadically.  She has no ischemic rest pain symptoms.  She has no ulcers about her feet.  08/10/22: returns to clinic. Symptoms are about the same. Reduced cigarette use - <1cig/day. No rest pain. No ulcers.  09/28/22: patient returns to clinic. She has stopped smoking. She feels she is deteriorating. She reports both cramping discomfort in the calves with walking. She also describes radiating discomfort in her legs from sitting from a long period of time. She denies back pain. She feels the radiating pain is more bothersome to her now.   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   Qualifier: Diagnosis of  By: Cheri Guppy     Diabetes mellitus type 2 in nonobese (HCC) 07/2016   Dyslipidemia    Headache(784.0)    History of pulmonary embolism 07/08/2020   HTN (hypertension)    Migraine    NSTEMI (non-ST elevated myocardial infarction) [facility])    PAD (peripheral artery disease) (HCC)    Pulmonary embolus (HCC)    unprovoked    Past Surgical History:  Procedure Laterality Date   BUNIONECTOMY  08/2011   right foot   CARDIAC CATHETERIZATION  2007   severe native 3 v dz, all grafts patent (LIMA-LAD, RIMA-Diag, SVG-OM1-OM2, R radial-PDA)   CESAREAN  SECTION  1984   CORONARY ARTERY BYPASS GRAFT  2006    Coronary artery bypass grafting x5 with a left  internal  mammary to the left anterior descending coronary artery.  Free right  internal mammary to the diagonal coronary artery, sequential reverse  saphenous vein graft to the first and second obtuse marginal, right  artery bypass to the posterior  descending coronary artery with endo-vein harvesting.   LEFT HEART CATH AND CORS/GRAFTS ANGIOGRAPHY N/A 05/05/2021   Procedure: LEFT HEART CATH AND CORS/GRAFTS ANGIOGRAPHY;  Surgeon: Marykay Lex, MD;  Location: [facility] INVASIVE CV LAB;  Service: Cardiovascular;  Laterality: N/A;   UMBILICAL HERNIA REPAIR N/A 01/16/2021   Procedure: UMBILICAL  HERNIA REPAIR;  Surgeon: Fritzi Mandes, MD;  Location: [facility] OR;  Service: General;  Laterality: N/A;    Family History  Problem Relation Age of Onset   Lupus Mother    Heart attack Father    Diabetes Father    Hypertension Father    Diabetes Paternal Grandmother    Hypertension Paternal Grandmother    Stroke Neg Hx    Kidney disease Neg Hx    Hyperlipidemia Neg Hx    Sudden death Neg Hx     Social History   Socioeconomic History   Marital status: Legally Separated    Spouse name: Not on file   Number of children: Not on file   Years of education: Not on file   Highest education level: Some college, no degree  Occupational History   Occupation: Armed forces training and education officer  Tobacco Use   Smoking status: Former    Years: 25    Types: Cigarettes    Quit date: 09/12/2021    Years since quitting: 1.2   Smokeless tobacco: Never   Tobacco comments:    0.5 pack cigarettes  per month per patient   Vaping Use   Vaping Use: Never used  Substance and Sexual Activity   Alcohol use: No    Alcohol/week: 0.0 standard drinks of alcohol   Drug use: Never   Sexual activity: Yes    Partners: Male    Comment: married  Other Topics Concern   Not on file  Social History Narrative   Regular exercise:  3 days weekly   Caffeine Use:  1 soda daily   One child biological daughter born in 72 and an adopted niece.   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          Social Determinants of Health   Financial Resource Strain: Medium Risk (10/05/2022)   Overall Financial Resource Strain (CARDIA)    Difficulty of Paying Living  Expenses: Somewhat hard  Food Insecurity: Food Insecurity Present (10/05/2022)   Hunger Vital Sign    Worried About Running Out of Food in the Last Year: Sometimes true    Ran Out of Food in the Last Year: Sometimes true  Transportation Needs: No Transportation Needs (10/05/2022)   PRAPARE - Administrator, Civil Service (Medical): No    Lack of Transportation (Non-Medical): No  Physical Activity: Insufficiently Active (10/05/2022)   Exercise Vital Sign    Days of Exercise per Week: 3 days    Minutes of Exercise per Session: 20 min  Stress: Stress Concern Present (10/05/2022)   [employer] of Occupational Health - Occupational Stress Questionnaire    Feeling of Stress : Rather much  Social Connections: Unknown (10/05/2022)   Social Connection and Isolation Panel [NHANES]  Frequency of Communication with Friends and Family: Twice a week    Frequency of Social Gatherings with Friends and Family: Once a week    Attends Religious Services: Patient declined    Database administrator or Organizations: No    Attends Engineer, structural: Not on file    Marital Status: Married  Catering manager Violence: Not on file    Allergies  Allergen Reactions   Metformin And Related Nausea And Vomiting    Current Facility-Administered Medications  Medication Dose Route Frequency Provider Last Rate Last Admin   0.9 %  sodium chloride infusion   Intravenous Continuous Leonie Douglas, MD       ceFAZolin (ANCEF) IVPB 2g/100 mL premix  2 g Intravenous 30 min Pre-Op Leonie Douglas, MD       Chlorhexidine Gluconate Cloth 2 % PADS 6 each  6 each Topical Once Leonie Douglas, MD       And   Chlorhexidine Gluconate Cloth 2 % PADS 6 each  6 each Topical Once Leonie Douglas, MD          PHYSICAL EXAM Vitals:   12/08/22 0717  BP: 129/79  Pulse: 64  Resp: 18  Temp: 98.1 F (36.7 C)  TempSrc: Oral  SpO2: 100%  Weight: 58.1 kg  Height: 5\' 5"  (1.651 m)     Constitutional:  well appearing. no distress. Appears well nourished.  Neurologic: CN intact. no focal findings. no sensory loss. Psychiatric:  Mood and affect symmetric and appropriate. Eyes:  No icterus. No conjunctival pallor. Ears, nose, throat:  mucous membranes moist. Midline trachea.  Cardiac: regular rate and rhythm.  Respiratory:  unlabored. Abdominal:  soft, non-tender, non-distended.  Peripheral vascular: no palpable femoral pulses. no palpable pedal pulses. Feet are warm. Extremity: no edema. no cyanosis. no pallor.  Skin: no gangrene. no ulceration.  Lymphatic: no Stemmer's sign. no palpable lymphadenopathy.  PERTINENT LABORATORY AND RADIOLOGIC DATA  Most recent CBC    Latest Ref Rng & Units 11/30/2022    1:51 PM 02/25/2022    9:50 AM 05/07/2021    1:49 AM  CBC  WBC 4.0 - 10.5 K/uL 7.8  6.1  9.7   Hemoglobin 12.0 - 15.0 g/dL 82.9  56.2  13.0   Hematocrit 36.0 - 46.0 % 34.9  36.5  37.3   Platelets 150 - 400 K/uL 164  187.0  199      Most recent CMP    Latest Ref Rng & Units 11/30/2022    1:51 PM 10/05/2022    2:44 PM 07/06/2022    1:28 PM  CMP  Glucose 70 - 99 mg/dL 99  865  51   BUN 6 - 20 mg/dL 24  19  20    Creatinine 0.44 - 1.00 mg/dL 7.84  6.96  2.95   Sodium 135 - 145 mmol/L 137  139  138   Potassium 3.5 - 5.1 mmol/L 4.3  4.4  4.5   Chloride 98 - 111 mmol/L 106  105  103   CO2 22 - 32 mmol/L 25  28  24    Calcium 8.9 - 10.3 mg/dL 9.4  9.3  9.6   Total Protein 6.5 - 8.1 g/dL 6.9  6.3  7.0   Total Bilirubin 0.3 - 1.2 mg/dL 0.7  0.6  0.6   Alkaline Phos 38 - 126 U/L 95  137  123   AST 15 - 41 U/L 17  48  18   ALT 0 -  44 U/L 18  63  15     Renal function Estimated Creatinine Clearance: 39.6 mL/min (A) (by C-G formula based on SCr of 1.36 mg/dL (H)).  Hgb A1c MFr Bld (%)  Date Value  10/05/2022 6.5    LDL Chol Calc (NIH)  Date Value Ref Range Status  07/17/2020 65 0 - 99 mg/dL Final   LDL Cholesterol  Date Value Ref Range Status  10/05/2022 70 0 - 99 mg/dL Final    Direct LDL  Date Value Ref Range Status  07/18/2008 145.2 mg/dL Final    Comment:    See lab report for associated comment(s)         +-------+-----------+-----------+------------+------------+  ABI/TBIToday's ABIToday's TBIPrevious ABIPrevious TBI  +-------+-----------+-----------+------------+------------+  Right 0.46       0.10       0.42        0             +-------+-----------+-----------+------------+------------+  Left  0.58       0          0.48        0.22          +-------+-----------+-----------+------------+------------+        Bailey Greene. [name], MD [facility] Phone Number: [phone] 12/08/2022 8:09 AM  Total time spent on preparing this encounter including chart review, data review, collecting history, examining the patient, coordinating care for this established patient, 30 minutes  Portions of this report may have been transcribed using voice recognition software.  Every effort has been made to ensure accuracy; however, inadvertent computerized transcription errors may still be present.

## 2022-12-09 DIAGNOSIS — I70293 Other atherosclerosis of native arteries of extremities, bilateral legs: Secondary | ICD-10-CM | POA: Diagnosis not present

## 2022-12-09 LAB — POCT ACTIVATED CLOTTING TIME
Activated Clotting Time: 244 seconds
Activated Clotting Time: 269 seconds

## 2022-12-09 LAB — BASIC METABOLIC PANEL
Anion gap: 6 (ref 5–15)
BUN: 16 mg/dL (ref 6–20)
CO2: 22 mmol/L (ref 22–32)
Calcium: 9 mg/dL (ref 8.9–10.3)
Chloride: 110 mmol/L (ref 98–111)
Creatinine, Ser: 1.15 mg/dL — ABNORMAL HIGH (ref 0.44–1.00)
GFR, Estimated: 55 mL/min — ABNORMAL LOW (ref >60)
Glucose, Bld: 129 mg/dL — ABNORMAL HIGH (ref 70–99)
Potassium: 4 mmol/L (ref 3.5–5.1)
Sodium: 138 mmol/L (ref 135–145)

## 2022-12-09 LAB — POCT I-STAT 7, (LYTES, BLD GAS, ICA,H+H)
Acid-base deficit: 2 mmol/L (ref 0.0–2.0)
Bicarbonate: 21.8 mmol/L (ref 20.0–28.0)
Calcium, Ion: 1.21 mmol/L (ref 1.15–1.40)
HCT: 30 % — ABNORMAL LOW (ref 36.0–46.0)
Hemoglobin: 10.2 g/dL — ABNORMAL LOW (ref 12.0–15.0)
O2 Saturation: 100 %
Potassium: 3.9 mmol/L (ref 3.5–5.1)
Sodium: 139 mmol/L (ref 135–145)
TCO2: 23 mmol/L (ref 22–32)
pCO2 arterial: 32.9 mmHg (ref 32–48)
pH, Arterial: 7.43 (ref 7.35–7.45)
pO2, Arterial: 247 mmHg — ABNORMAL HIGH (ref 83–108)

## 2022-12-09 LAB — CBC
HCT: 25.9 % — ABNORMAL LOW (ref 36.0–46.0)
Hemoglobin: 8.9 g/dL — ABNORMAL LOW (ref 12.0–15.0)
MCH: 30 pg (ref 26.0–34.0)
MCHC: 34.4 g/dL (ref 30.0–36.0)
MCV: 87.2 fL (ref 80.0–100.0)
Platelets: 148 10*3/uL — ABNORMAL LOW (ref 150–400)
RBC: 2.97 MIL/uL — ABNORMAL LOW (ref 3.87–5.11)
RDW: 13.8 % (ref 11.5–15.5)
WBC: 10.7 10*3/uL — ABNORMAL HIGH (ref 4.0–10.5)
nRBC: 0 % (ref 0.0–0.2)

## 2022-12-09 LAB — GLUCOSE, CAPILLARY: Glucose-Capillary: 123 mg/dL — ABNORMAL HIGH (ref 70–99)

## 2022-12-09 MED ORDER — OXYCODONE-ACETAMINOPHEN 5-325 MG PO TABS: 1.0000 | ORAL_TABLET | ORAL | 0 refills | Status: DC | PRN

## 2022-12-09 NOTE — Progress Notes (Addendum)
[facility] [place]  Subjective  - feels good   Objective 104/63 69 98 F (36.7 C) (Oral) 12 100%  Intake/Output Summary (Last 24 hours) at 12/09/2022 0730 Last data filed at 12/09/2022 0230 Gross per 24 hour  Intake 2098.19 ml  Output 800 ml  Net 1298.19 ml    Palpable DP pulse left LE, brisk doppler DP/PT on the right Right groin soft and healing well Lungs non labored breathing  Assessment/Planning: POD # 1  62 y.o. female is s/p: right common femoral endarterectomy with bilateral common iliac stents   Groin healing well and she has brisk doppler signals Plan for discharge home today Continue ASA, plavix, statin   Bailey Greene 12/09/2022 7:30 AM --  Laboratory Lab Results: Recent Labs    12/08/22 1105 12/09/22 0330  WBC 7.7 10.7*  HGB 10.9* 8.9*  HCT 32.2* 25.9*  PLT 162 148*   BMET Recent Labs    12/08/22 1105 12/09/22 0330  NA  --  138  K  --  4.0  CL  --  110  CO2  --  22  GLUCOSE  --  129*  BUN  --  16  CREATININE 0.98 1.15*  CALCIUM  --  9.0    COAG Lab Results  Component Value Date   INR 1.0 11/30/2022   No results found for: "PTT"  VASCULAR STAFF ADDENDUM: I have independently interviewed and examined the patient. I agree with the above.  Looks great POD#1 RCEA endarterectomy and BCIA stenting for claudication. Has walked and noted resolution of claudication symptoms.  ASA / Plavix / Statin Follow up with me in 4 weeks with ABI  [name]. [name], MD [facility] Phone Number: [phone] 12/09/2022 7:58 AM

## 2022-12-09 NOTE — Evaluation (Signed)
Physical Therapy Evaluation Patient Details Name: Bailey Greene MRN: [id number] DOB: 05/23/1961 Today's Date: 12/09/2022  History of Present Illness  Pt is a 62 y/o F admitted on 12/08/22 for scheduled R common femoral endarterectomy with B common iliac stents. PMH: anxiety, bipolar, CAD, DM2, dyslipidemia, HTN, NSTEMI, PAD, PE  Clinical Impression  Pt seen for PT evaluation with pt agreeable to tx. Pt notes prior to admission she was independent without AD, working, driving & living with her husband. On this date, pt is able to complete bed mobility & transfers with mod I, ambulate into bathroom & hallway without AD with supervision. Recommend acute PT services to address endurance, strengthening, & balance training.       Assistance Recommended at Discharge PRN  If plan is discharge home, recommend the following:  Can travel by private vehicle  Assistance with cooking/housework;Assist for transportation;Help with stairs or ramp for entrance        Equipment Recommendations None recommended by PT  Recommendations for Other Services       Functional Status Assessment Patient has had a recent decline in their functional status and demonstrates the ability to make significant improvements in function in a reasonable and predictable amount of time.     Precautions / Restrictions Precautions Precautions: Fall Restrictions Weight Bearing Restrictions: No      Mobility  Bed Mobility Overal bed mobility: Modified Independent             General bed mobility comments: supine>sit with HOB elevated    Transfers Overall transfer level: Modified independent Equipment used: None               General transfer comment: STS from EOB, toilet, recliner. Able to transfer STS from recliner without BUE support with mod I.    Ambulation/Gait Ambulation/Gait assistance: Supervision Gait Distance (Feet): 125 Feet Assistive device: None   Gait velocity: decreased      General Gait Details: 1 instance of scissoring gait but pt able to self correct  Stairs            Wheelchair Mobility     Tilt Bed    Modified Rankin (Stroke Patients Only)       Balance Overall balance assessment: Needs assistance Sitting-balance support: No upper extremity supported, Feet supported Sitting balance-Leahy Scale: Normal Sitting balance - Comments: toileted without assistance   Standing balance support: No upper extremity supported, During functional activity Standing balance-Leahy Scale: Good Standing balance comment: Pt with 1 LOB when standing & donning shoes (but stepping backwards & crossing BLE when attempting to don shoes, educated pt on safety with this task at home)                             Pertinent Vitals/Pain Pain Assessment Pain Assessment: No/denies pain    Home Living Family/patient expects to be discharged to:: Private residence Living Arrangements: Spouse/significant other Available Help at Discharge: Family Type of Home: House Home Access: Level entry       Home Layout: One level Home Equipment: None      Prior Function Prior Level of Function : Independent/Modified Independent;Working/employed;Driving                     Hand Dominance        Extremity/Trunk Assessment   Upper Extremity Assessment Upper Extremity Assessment: Overall WFL for tasks assessed    Lower Extremity Assessment Lower Extremity Assessment: Generalized weakness;Overall  WFL for tasks assessed    Cervical / Trunk Assessment Cervical / Trunk Assessment: Normal  Communication   Communication: No difficulties  Cognition Arousal/Alertness: Awake/alert Behavior During Therapy: WFL for tasks assessed/performed Overall Cognitive Status: Within Functional Limits for tasks assessed                                          General Comments General comments (skin integrity, edema, etc.): Pt toileted without  assistance, denied dizziness/lightheadedness during session.    Exercises Other Exercises Other Exercises: 5x STS from recliner without BUE support with mod I with educational cuing for normal vs narrow BOS, activity focused on BLE strengthening & endurance training.   Assessment/Plan    PT Assessment Patient needs continued PT services  PT Problem List Decreased strength;Decreased balance;Decreased activity tolerance       PT Treatment Interventions Therapeutic exercise;DME instruction;Gait training;Balance training;Stair training;Neuromuscular re-education;Functional mobility training;Therapeutic activities;Patient/family education    PT Goals (Current goals can be found in the Care Plan section)  Acute Rehab PT Goals Patient Stated Goal: get better, go home PT Goal Formulation: With patient Time For Goal Achievement: 12/23/22 Potential to Achieve Goals: Good    Frequency Min 1X/week     Co-evaluation               AM-PAC PT "6 Clicks" Mobility  Outcome Measure Help needed turning from your back to your side while in a flat bed without using bedrails?: None Help needed moving from lying on your back to sitting on the side of a flat bed without using bedrails?: None Help needed moving to and from a bed to a chair (including a wheelchair)?: None Help needed standing up from a chair using your arms (e.g., wheelchair or bedside chair)?: None Help needed to walk in hospital room?: A Little Help needed climbing 3-5 steps with a railing? : A Little 6 Click Score: 22    End of Session   Activity Tolerance: Patient tolerated treatment well Patient left: in chair;with nursing/sitter in room;with call bell/phone within reach Nurse Communication: Mobility status PT Visit Diagnosis: Unsteadiness on feet (R26.81);Muscle weakness (generalized) (M62.81)    Time: 1610-9604 PT Time Calculation (min) (ACUTE ONLY): 13 min   Charges:   PT Evaluation $PT Eval Low Complexity: 1  Low   PT General Charges $$ ACUTE PT VISIT: 1 Visit         Aleda Grana, PT, DPT 12/09/22, 9:07 AM   Sandi Mariscal 12/09/2022, 9:00 AM

## 2022-12-09 NOTE — TOC Transition Note (Signed)
Transition of Care (TOC) - CM/SW Discharge Note Donn Pierini RN, BSN Transitions of Care Unit 4E- RN Case Manager See Treatment Team for direct phone #   Patient Details  Name: Lona Six MRN: [id number] Date of Birth: 12-08-1960  Transition of Care [facility]) CM/SW Contact:  Darrold Span, RN Phone Number: 12/09/2022, 9:43 AM   Clinical Narrative:    Pt stable for transition home today, CM was notified by [facility] liaison that they have [facility] office referral for [facility] needs- they will follow up with pt post discharge.   No further TOC needs noted. Pt has transportation home this am   Final next level of care: Home w Home Health Services Barriers to Discharge: No Barriers Identified   Patient Goals and CMS Choice    [facility] office referral for [facility]  Discharge Placement                 Home w/ [facility]        Discharge Plan and Services Additional resources added to the After Visit Summary for                              [facility] Agency: [facility]     Representative spoke with at [facility] Agency: Iantha Fallen (VVS office protocol)  Social Determinants of Health (SDOH) Interventions SDOH Screenings   Food Insecurity: Food Insecurity Present (10/05/2022)  Housing: Low Risk  (10/05/2022)  Transportation Needs: No Transportation Needs (10/05/2022)  Depression (PHQ2-9): Medium Risk (07/06/2022)  Financial Resource Strain: Medium Risk (10/05/2022)  Physical Activity: Insufficiently Active (10/05/2022)  Social Connections: Unknown (10/05/2022)  Stress: Stress Concern Present (10/05/2022)  Tobacco Use: Medium Risk (12/08/2022)     Readmission Risk Interventions     No data to display

## 2022-12-09 NOTE — Discharge Instructions (Signed)
 [facility]   Discharge instructions  Lower Extremity Bypass Surgery  Please refer to the following instruction for your post-procedure care. Your surgeon or physician assistant will discuss any changes with you.  Activity  You are encouraged to walk as much as you can. You can slowly return to normal activities during the month after your surgery. Avoid strenuous activity and heavy lifting until your doctor tells you it's OK. Avoid activities such as vacuuming or swinging a golf club. Do not drive until your doctor give the OK and you are no longer taking prescription pain medications. It is also normal to have difficulty with sleep habits, eating and bowel movement after surgery. These will go away with time.  Bathing/Showering  You may shower after you go home. Do not soak in a bathtub, hot tub, or swim until the incision heals completely.  Incision Care  Clean your incision with mild soap and water. Shower every day. Pat the area dry with a clean towel. You do not need a bandage unless otherwise instructed. Do not apply any ointments or creams to your incision. If you have open wounds you will be instructed how to care for them or a visiting nurse may be arranged for you. If you have staples or sutures along your incision they will be removed at your post-op appointment. You may have skin glue on your incision. Do not peel it off. It will come off on its own in about one week. If you have a great deal of moisture in your groin, use a gauze help keep this area dry.  Diet  Resume your normal diet. There are no special food restrictions following this procedure. A low fat/ low cholesterol diet is recommended for all patients with vascular disease. In order to heal from your surgery, it is CRITICAL to get adequate nutrition. Your body requires vitamins, minerals, and protein. Vegetables are the best source of vitamins and minerals. Vegetables also provide the  perfect balance of protein. Processed food has little nutritional value, so try to avoid this.  Medications  Resume taking all your medications unless your doctor or nurse practitioner tells you not to. If your incision is causing pain, you may take over-the-counter pain relievers such as acetaminophen (Tylenol). If you were prescribed a stronger pain medication, please aware these medication can cause nausea and constipation. Prevent nausea by taking the medication with a snack or meal. Avoid constipation by drinking plenty of fluids and eating foods with high amount of fiber, such as fruits, vegetables, and grains. Take Colase 100 mg (an over-the-counter stool softener) twice a day as needed for constipation. Do not take Tylenol if you are taking prescription pain medications.  Follow Up  Our office will schedule a follow up appointment 2-3 weeks following discharge.  Please call us immediately for any of the following conditions  Severe or worsening pain in your legs or feet while at rest or while walking Increase pain, redness, warmth, or drainage (pus) from your incision site(s) Fever of 101 degree or higher The swelling in your leg with the bypass suddenly worsens and becomes more painful than when you were in the hospital If you have been instructed to feel your graft pulse then you should do so every day. If you can no longer feel this pulse, call the office immediately. Not all patients are given this instruction.  Leg swelling is common after leg bypass surgery.  The swelling should improve over a few months   following surgery. To improve the swelling, you may elevate your legs above the level of your heart while you are sitting or resting. Your surgeon or physician assistant may ask you to apply an ACE wrap or wear compression (TED) stockings to help to reduce swelling.  Reduce your risk of vascular disease  Stop smoking. If you would like help call [facility] at 1-800-QUIT-NOW  (1-800-784-8669) or [place] at 336-586-4000.  Manage your cholesterol Maintain a desired weight Control your diabetes weight Control your diabetes Keep your blood pressure down  If you have any questions, please call the office at [phone]   

## 2022-12-09 NOTE — Progress Notes (Signed)
OT Cancellation Note  Patient Details Name: Bailey Greene MRN: [id number] DOB: [birth year]   Cancelled Treatment:    Reason Eval/Treat Not Completed: OT screened, no needs identified, will sign off  Limmie Patricia, OTR/L,CBIS  Supplemental OT - [facility] and [facility] Secure Chat Preferred   12/09/2022, 9:00 AM

## 2022-12-09 NOTE — Progress Notes (Signed)
Discharge orders received.  Education regarding medications, follow up appts and post op care reviewed. Pt expresses understanding.  IVs and telemetry removed.  CCMD notified.  Pt husband notified and en route to pick up.

## 2022-12-10 ENCOUNTER — Ambulatory Visit: Payer: 59 | Admitting: Behavioral Health

## 2022-12-10 ENCOUNTER — Telehealth: Payer: Self-pay

## 2022-12-10 ENCOUNTER — Encounter (HOSPITAL_COMMUNITY): Payer: Self-pay | Admitting: Vascular Surgery

## 2022-12-10 NOTE — Transitions of Care (Post Inpatient/ED Visit) (Signed)
12/10/2022  Name: Bailey Greene MRN: [id number] DOB: 1960-09-07  Today's TOC FU Call Status: Today's TOC FU Call Status:: Successful TOC FU Call Competed TOC FU Call Complete Date: 12/10/22  Transition Care Management Follow-up Telephone Call Date of Discharge: 12/09/22 Discharge Facility: [facility]) Type of Discharge: Inpatient Admission Primary Inpatient Discharge Diagnosis:: stricture of artery How have you been since you were released from the hospital?: Better Any questions or concerns?: No  Items Reviewed: Did you receive and understand the discharge instructions provided?: Yes Medications obtained,verified, and reconciled?: Yes (Medications Reviewed) Any new allergies since your discharge?: No Dietary orders reviewed?: Yes Do you have support at home?: Yes People in Home: spouse  Medications Reviewed Today: Medications Reviewed Today     Reviewed by Karena Addison, LPN (Licensed Practical Nurse) on 12/10/22 at [phone]  Med List Status: <None>   Medication Order Taking? Sig Documenting Provider Last Dose Status Informant  ACCU-CHEK GUIDE test strip [id number] No USE UP TO FOUR TIMES DAILY AS DIRECTED Sandford Craze, NP Taking Active Self  Accu-Chek Softclix Lancets lancets [id number] No USE UP TO FOUR TIMES DAILY AS DIRECTED Sandford Craze, NP Taking Active Self  ALPRAZolam (XANAX) 0.5 MG tablet [id number] No TAKE 1 TABLET (0.5 MG TOTAL) BY MOUTH 3 (THREE) TIMES DAILY AS NEEDED FOR ANXIETY. Lauraine Rinne., MD 12/07/2022 Active Self  aspirin 81 MG EC tablet [id number] No Take 1 tablet (81 mg total) by mouth daily. Swallow whole. Sandford Craze, NP 12/08/2022 Active Self  atorvastatin (LIPITOR) 80 MG tablet [id number] No TAKE 1 TABLET BY MOUTH DAILY Lennette Bihari, MD 12/07/2022 Active Self  BD PEN NEEDLE NANO U/F 32G X 4 MM MISC [id number] No USE AS DIRECTED Sandford Craze, NP Taking Active Self  blood glucose meter kit and supplies KIT [id number] No  Dispense based on patient and insurance preference. Use up to four times daily as directed. (FOR ICD-9 250.00, 250.01). Joseph Art, DO Taking Active Self  busPIRone (BUSPAR) 30 MG tablet [id number] No Take 30 mg by mouth daily. [provider] 12/07/2022 Active Self  Cholecalciferol (VITAMIN D3) 75 MCG (3000 UT) TABS [id number] No Take 1 tablet by mouth daily at 6 (six) AM. Sandford Craze, NP Past Week Active Self  cilostazol (PLETAL) 100 MG tablet [id number] No Take 1 tablet (100 mg total) by mouth 2 (two) times daily before a meal. Leonie Douglas, MD 12/02/2022 Active Self  cloNIDine (CATAPRES) 0.1 MG tablet [id number] No 1 in the AM and 3 PM and 1 extra daily as needed for anxiety Cottle, Steva Ready., MD 12/07/2022 Active Self  clopidogrel (PLAVIX) 75 MG tablet [id number] No TAKE 1 TABLET BY MOUTH EVERY DAY WITH Davy Pique, MD 12/02/2022 Active Self  Continuous Glucose Receiver (DEXCOM G6 RECEIVER) DEVI [id number] No Use as directed Sandford Craze, NP Taking Active Self  Continuous Glucose Sensor (DEXCOM G6 SENSOR) MISC [id number] No USE AS DIRECTED Sandford Craze, NP Taking Active Self  Continuous Glucose Transmitter (DEXCOM G6 TRANSMITTER) MISC [id number] No USE AS DIRECTED Sandford Craze, NP Taking Active Self  FARXIGA 10 MG TABS tablet [id number] No TAKE 1 TABLET BY MOUTH DAILY BEFORE BREAKFAST Lennette Bihari, MD 12/07/2022 Active Self  insulin degludec (TRESIBA FLEXTOUCH) 100 UNIT/ML FlexTouch Pen [id number] No Inject 12 Units into the skin daily. Sandford Craze, NP 12/07/2022 Active Self  isosorbide mononitrate (IMDUR) 120 MG 24 hr tablet [id number] No TAKE 1 TABLET BY MOUTH EVERY DAY Rollene Rotunda, MD 12/07/2022 Active Self  JANUVIA  50 MG tablet [id number] No TAKE 1 TABLET BY MOUTH EVERY DAY Sandford Craze, NP 12/07/2022 Active Self  lisinopril (ZESTRIL) 5 MG tablet [id number] No TAKE 1 TABLET (5 MG TOTAL) BY MOUTH DAILY. Sandford Craze, NP 12/07/2022  Active Self  metoprolol succinate (TOPROL-XL) 25 MG 24 hr tablet [id number] No TAKE 1 TABLET BY MOUTH EVERY DAY *KEEP APPOINTMENT FOR REFILLSRollene Rotunda, MD 12/08/2022 Active Self  mupirocin ointment (BACTROBAN) 2 % [id number] No Apply 1 application. topically 2 (two) times daily. Delories Heinz, DPM 12/07/2022 Active Self  nitroGLYCERIN (NITROSTAT) 0.4 MG SL tablet [id number] No PLACE 1 TABLET UNDER THE TONGUE EVERY 5 MINUTES AS NEEDED FOR CHEST PAIN. MAX 3. CALL 911 Hochrein, Fayrene Fearing, MD Taking Active Self  OLANZapine (ZYPREXA) 20 MG tablet [id number] No Take 1 tablet (20 mg total) by mouth at bedtime. Lauraine Rinne., MD 12/07/2022 Active Self  oxyCODONE-acetaminophen (PERCOCET/ROXICET) 5-325 MG tablet [id number]  Take 1-2 tablets by mouth every 4 (four) hours as needed for moderate pain. Clinton Gallant M, PA-C  Active   PARoxetine (PAXIL) 40 MG tablet [id number] No Take 1 tablet (40 mg total) by mouth 2 (two) times daily. As of 04/08/22 Cottle, Steva Ready., MD 12/07/2022 Active Self  ranolazine (RANEXA) 500 MG 12 hr tablet [id number] No TAKE 1 TABLET BY MOUTH TWICE A Kenn File, MD 12/07/2022 Active Self  XIIDRA 5 % SOLN [id number] No Place 1 drop into both eyes in the morning and at bedtime. [provider] 12/07/2022 Active Self            Home Care and Equipment/Supplies: Were Home Health Services Ordered?: Yes Name of Home Health Agency:: [facility] Has Agency set up a time to come to your home?: No Any new equipment or medical supplies ordered?: NA  Functional Questionnaire: Do you need assistance with bathing/showering or dressing?: No Do you need assistance with meal preparation?: No Do you need assistance with eating?: No Do you have difficulty maintaining continence: No Do you need assistance with getting out of bed/getting out of a chair/moving?: No Do you have difficulty managing or taking your medications?: No  Follow up appointments reviewed: PCP Follow-up  appointment confirmed?: Yes Date of PCP follow-up appointment?: 12/15/22 Follow-up Provider: [facility] Follow-up appointment confirmed?: Yes Date of Specialist follow-up appointment?: 12/23/22 Follow-Up Specialty Provider:: vascular Do you need transportation to your follow-up appointment?: No Do you understand care options if your condition(s) worsen?: Yes-patient verbalized understanding    SIGNATURE Karena Addison, LPN [facility] Nurse Health Advisor Direct Dial [phone]

## 2022-12-12 NOTE — Discharge Summary (Signed)
[facility] Discharge Summary   Patient ID:  Bailey Greene MRN: [id number] DOB/AGE: [birth year] 62 y.o.  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   Qualifier: Diagnosis of  By: Cheri Guppy     Diabetes mellitus type 2 in nonobese (HCC) 07/2016   Dyslipidemia    Headache(784.0)    History of pulmonary embolism 07/08/2020   HTN (hypertension)    Migraine    NSTEMI (non-ST elevated myocardial infarction) (HCC)    PAD (peripheral artery disease) (HCC)    Pulmonary embolus (HCC)    unprovoked    Procedure(s): RIGHT COMMON FEMORAL ENDARTERECTOMY INSERTION OF BILATERAL ILIAC KISSING STENTS USING VIABAHN VBX STENTS ULTRASOUND GUIDANCE FOR VASCULAR ACCESS OF LEFT FEMORAL ARTERY AORTOGRAM  Discharged Condition: good  HPI: Bailey Greene is a 62 y.o. female with bilateral lower extremity claudication.  Preoperative CT angiogram showed bilateral common iliac artery stenosis and right common femoral artery stenosis.     Hospital Course:  Bailey Greene is a 62 y.o. female is S/P  Procedure(s): RIGHT COMMON FEMORAL ENDARTERECTOMY INSERTION OF BILATERAL ILIAC KISSING STENTS USING VIABAHN VBX STENTS ULTRASOUND GUIDANCE FOR VASCULAR ACCESS OF LEFT FEMORAL ARTERY AORTOGRAM Post op Incisions:  right groin incision dressed and dry. Groin is soft without hematoma Extremities:  brisk DP/PT doppler signals bilaterally No deficits Plan for discharge home today Continue ASA, plavix, statin    Significant Diagnostic  Studies: CBC Lab Results  Component Value Date   WBC 10.7 (H) 12/09/2022   HGB 8.9 (L) 12/09/2022   HCT 25.9 (L) 12/09/2022   MCV 87.2 12/09/2022   PLT 148 (L) 12/09/2022    BMET    Component Value Date/Time   NA 138 12/09/2022 0330   NA 141 07/23/2020 1300   K 4.0 12/09/2022 0330   CL 110 12/09/2022 0330   CO2 22 12/09/2022 0330   GLUCOSE 129 (H) 12/09/2022 0330   BUN 16 12/09/2022 0330   BUN 7 07/23/2020 1300   CREATININE 1.15 (H) 12/09/2022 0330   CREATININE 0.72 06/22/2012 0840   CALCIUM 9.0 12/09/2022 0330   GFRNONAA 55 (L) 12/09/2022 0330   GFRNONAA >89 06/22/2012 0840   GFRAA 102 07/23/2020 1300   GFRAA >89 06/22/2012 0840   COAG Lab Results  Component Value Date   INR 1.0 11/30/2022     Disposition:  Discharge to :Home Discharge Instructions     Call MD for:  redness, tenderness, or signs of infection (pain, swelling, bleeding, redness, odor or green/yellow discharge around incision site)   Complete by: As directed    Call MD for:  severe or increased pain, loss or decreased feeling  in affected limb(s)   Complete by: As directed    Call MD for:  temperature >100.5   Complete by: As directed    Resume previous diet   Complete by: As directed       Allergies as of 12/09/2022       Reactions   Metformin And Related Nausea And Vomiting        Medication List     TAKE these medications  Accu-Chek Guide test strip Generic drug: glucose blood USE UP TO FOUR TIMES DAILY AS DIRECTED   Accu-Chek Softclix Lancets lancets USE UP TO FOUR TIMES DAILY AS DIRECTED   ALPRAZolam 0.5 MG tablet Commonly known as: XANAX TAKE 1 TABLET (0.5 MG TOTAL) BY MOUTH 3 (THREE) TIMES DAILY AS NEEDED FOR ANXIETY.   aspirin EC 81 MG tablet Take 1 tablet (81 mg total) by mouth daily. Swallow whole.   atorvastatin 80 MG tablet Commonly known as: LIPITOR TAKE 1 TABLET BY MOUTH DAILY   BD Pen Needle Nano U/F 32G X 4 MM Misc Generic drug: Insulin Pen Needle USE  AS DIRECTED   blood glucose meter kit and supplies Kit Dispense based on patient and insurance preference. Use up to four times daily as directed. (FOR ICD-9 250.00, 250.01).   busPIRone 30 MG tablet Commonly known as: BUSPAR Take 30 mg by mouth daily.   cilostazol 100 MG tablet Commonly known as: PLETAL Take 1 tablet (100 mg total) by mouth 2 (two) times daily before a meal.   cloNIDine 0.1 MG tablet Commonly known as: CATAPRES 1 in the AM and 3 PM and 1 extra daily as needed for anxiety   clopidogrel 75 MG tablet Commonly known as: PLAVIX TAKE 1 TABLET BY MOUTH EVERY DAY WITH BREAKFAST   Dexcom G6 Receiver Devi Use as directed   Dexcom G6 Sensor Misc USE AS DIRECTED   Dexcom G6 Transmitter Misc USE AS DIRECTED   Farxiga 10 MG Tabs tablet Generic drug: dapagliflozin propanediol TAKE 1 TABLET BY MOUTH DAILY BEFORE BREAKFAST   isosorbide mononitrate 120 MG 24 hr tablet Commonly known as: IMDUR TAKE 1 TABLET BY MOUTH EVERY DAY   Januvia 50 MG tablet Generic drug: sitaGLIPtin TAKE 1 TABLET BY MOUTH EVERY DAY   lisinopril 5 MG tablet Commonly known as: ZESTRIL TAKE 1 TABLET (5 MG TOTAL) BY MOUTH DAILY.   metoprolol succinate 25 MG 24 hr tablet Commonly known as: TOPROL-XL TAKE 1 TABLET BY MOUTH EVERY DAY *KEEP APPOINTMENT FOR REFILLS*   mupirocin ointment 2 % Commonly known as: BACTROBAN Apply 1 application. topically 2 (two) times daily.   nitroGLYCERIN 0.4 MG SL tablet Commonly known as: NITROSTAT PLACE 1 TABLET UNDER THE TONGUE EVERY 5 MINUTES AS NEEDED FOR CHEST PAIN. MAX 3. CALL 911   OLANZapine 20 MG tablet Commonly known as: ZYPREXA Take 1 tablet (20 mg total) by mouth at bedtime.   oxyCODONE-acetaminophen 5-325 MG tablet Commonly known as: PERCOCET/ROXICET Take 1-2 tablets by mouth every 4 (four) hours as needed for moderate pain.   PARoxetine 40 MG tablet Commonly known as: PAXIL Take 1 tablet (40 mg total) by mouth 2 (two) times daily. As of  04/08/22   ranolazine 500 MG 12 hr tablet Commonly known as: RANEXA TAKE 1 TABLET BY MOUTH TWICE A DAY   Tresiba FlexTouch 100 UNIT/ML FlexTouch Pen Generic drug: insulin degludec Inject 12 Units into the skin daily.   Vitamin D3 75 MCG (3000 UT) Tabs Take 1 tablet by mouth daily at 6 (six) AM.   Xiidra 5 % Soln Generic drug: Lifitegrast Place 1 drop into both eyes in the morning and at bedtime.       Verbal and written Discharge instructions given to the patient. Wound care per Discharge AVS  Follow-up Information     Leonie Douglas, MD Follow up in 2 week(s).   Specialties: Vascular Surgery, Interventional Cardiology Why: Office will call you to arrange your appt (sent) [facility] sent  f/u Contact information: [address] [place] [address] [phone]         [facility] Follow up.   Why: Iantha Fallen) Vascular office [facility] referral- they will contact you post discharge to follow up Contact information: [address] [place] [address] [phone]                 Signed: Mosetta Pigeon 12/12/2022, 8:12 AM

## 2022-12-13 NOTE — Progress Notes (Unsigned)
Cardiology Clinic Note   Patient Name: Bailey Greene Date of Encounter: 12/14/2022  Primary Care Provider:  Sandford Craze, NP Primary Cardiologist:  Bailey Rotunda, MD  Patient Profile    62 year old female with history of bilateral lower extremity claudication with bilateral common iliac artery stenosis and right common femoral artery stenosis who is status post hospitalization for right common femoral endarterectomy and insertion of bilateral iliac kissing stents by Bailey Greene on 12/08/2022.    She was to continue aspirin Plavix and statin post discharge other history includes coronary artery disease status post CABG x 5 in 2006, severe multivessel native CAD out of 5 grafts patent (LIMA-LAD, free RIMA-diagonal, left radial-RPDA precardiac catheterization 2022, hyperlipidemia, hypertension, type 2 diabetes.  Past Medical History    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   Qualifier: Diagnosis of  By: Bailey Greene     Diabetes mellitus type 2 in nonobese (HCC) 07/2016   Dyslipidemia    Headache(784.0)    History of pulmonary embolism 07/08/2020   HTN (hypertension)    Migraine    NSTEMI (non-ST elevated myocardial infarction) (HCC)    PAD (peripheral artery disease) (HCC)    Pulmonary embolus (HCC)    unprovoked   Past Surgical History:  Procedure Laterality Date   AORTOGRAM N/A 12/08/2022   Procedure: AORTOGRAM;  Surgeon: Bailey Douglas, MD;  Location: [facility] OR;  Service: Vascular;  Laterality: N/A;   BUNIONECTOMY  08/2011   right foot   CARDIAC CATHETERIZATION  2007   severe native 3 v dz, all grafts patent (LIMA-LAD, RIMA-Diag, SVG-OM1-OM2, R radial-PDA)   CESAREAN SECTION  1984   CORONARY ARTERY BYPASS GRAFT  2006    Coronary artery bypass grafting x5 with a left  internal  mammary  to the left anterior descending coronary artery.  Free right  internal mammary to the diagonal coronary artery, sequential reverse  saphenous vein graft to the first and second obtuse marginal, right  artery bypass to the posterior descending coronary artery with endo-vein harvesting.   ENDARTERECTOMY FEMORAL Right 12/08/2022   Procedure: RIGHT COMMON FEMORAL ENDARTERECTOMY;  Surgeon: Bailey Douglas, MD;  Location: [facility] OR;  Service: Vascular;  Laterality: Right;   INSERTION OF ILIAC STENT Bilateral 12/08/2022   Procedure: INSERTION OF BILATERAL ILIAC KISSING STENTS USING VIABAHN VBX STENTS;  Surgeon: Bailey Douglas, MD;  Location: [facility] OR;  Service: Vascular;  Laterality: Bilateral;   LEFT HEART CATH AND CORS/GRAFTS ANGIOGRAPHY N/A 05/05/2021   Procedure: LEFT HEART CATH AND CORS/GRAFTS ANGIOGRAPHY;  Surgeon: Bailey Lex, MD;  Location: [facility] INVASIVE CV LAB;  Service: Cardiovascular;  Laterality: N/A;   ULTRASOUND GUIDANCE FOR VASCULAR ACCESS Left 12/08/2022   Procedure: ULTRASOUND GUIDANCE FOR VASCULAR ACCESS OF LEFT FEMORAL ARTERY;  Surgeon: Bailey Douglas, MD;  Location: [facility] OR;  Service: Vascular;  Laterality: Left;   UMBILICAL HERNIA REPAIR N/A 01/16/2021   Procedure: UMBILICAL  HERNIA REPAIR;  Surgeon: Bailey Mandes, MD;  Location: [facility] OR;  Service: General;  Laterality: N/A;    Allergies  Allergies  Allergen Reactions   Metformin And Related Nausea And Vomiting    History of Present Illness    Bailey Greene is a very pleasant 61 year old female patient who comes today status post vascular surgery by Bailey Greene, on 12/08/2022 with right common  femoral endarterectomy and insertion of bilateral iliac kissing stents.  The patient is without complaint of chest pain or dyspnea on exertion.  Her energy level still remains low and she is slowly increasing her activity and walking as directed.  She denies significant leg pain or intermittent claudication symptoms but she does have some groin pain  at insertion sites.  She denies any bleeding on clopidogrel and aspirin.  She is medically compliant.  Home Medications    Current Outpatient Medications  Medication Sig Dispense Refill   ACCU-CHEK GUIDE test strip USE UP TO FOUR TIMES DAILY AS DIRECTED 400 strip 6   Accu-Chek Softclix Lancets lancets USE UP TO FOUR TIMES DAILY AS DIRECTED 100 each 12   ALPRAZolam (XANAX) 0.5 MG tablet TAKE 1 TABLET (0.5 MG TOTAL) BY MOUTH 3 (THREE) TIMES DAILY AS NEEDED FOR ANXIETY. 90 tablet 0   aspirin 81 MG EC tablet Take 1 tablet (81 mg total) by mouth daily. Swallow whole. 30 tablet 12   atorvastatin (LIPITOR) 80 MG tablet TAKE 1 TABLET BY MOUTH DAILY 90 tablet 3   BD PEN NEEDLE NANO U/F 32G X 4 MM MISC USE AS DIRECTED 300 each 1   blood glucose meter kit and supplies KIT Dispense based on patient and insurance preference. Use up to four times daily as directed. (FOR ICD-9 250.00, 250.01). 1 each 0   busPIRone (BUSPAR) 30 MG tablet Take 30 mg by mouth daily.     Cholecalciferol (VITAMIN D3) 75 MCG (3000 UT) TABS Take 1 tablet by mouth daily at 6 (six) AM. 30 tablet    cilostazol (PLETAL) 100 MG tablet Take 1 tablet (100 mg total) by mouth 2 (two) times daily before a meal. 60 tablet 11   cloNIDine (CATAPRES) 0.1 MG tablet 1 in the AM and 3 PM and 1 extra daily as needed for anxiety 270 tablet 0   clopidogrel (PLAVIX) 75 MG tablet TAKE 1 TABLET BY MOUTH EVERY DAY WITH BREAKFAST 90 tablet 3   Continuous Glucose Receiver (DEXCOM G6 RECEIVER) DEVI Use as directed 1 each 0   Continuous Glucose Sensor (DEXCOM G6 SENSOR) MISC USE AS DIRECTED 3 each 1   Continuous Glucose Transmitter (DEXCOM G6 TRANSMITTER) MISC USE AS DIRECTED 1 each 1   FARXIGA 10 MG TABS tablet TAKE 1 TABLET BY MOUTH DAILY BEFORE BREAKFAST 30 tablet 11   insulin degludec (TRESIBA FLEXTOUCH) 100 UNIT/ML FlexTouch Pen Inject 12 Units into the skin daily. 3 mL 5   isosorbide mononitrate (IMDUR) 120 MG 24 hr tablet TAKE 1 TABLET BY MOUTH EVERY  DAY 90 tablet 3   JANUVIA 50 MG tablet TAKE 1 TABLET BY MOUTH EVERY DAY 90 tablet 1   lisinopril (ZESTRIL) 5 MG tablet TAKE 1 TABLET (5 MG TOTAL) BY MOUTH DAILY. 90 tablet 1   metoprolol succinate (TOPROL-XL) 25 MG 24 hr tablet TAKE 1 TABLET BY MOUTH EVERY DAY *KEEP APPOINTMENT FOR REFILLS* 90 tablet 3   mupirocin ointment (BACTROBAN) 2 % Apply 1 application. topically 2 (two) times daily. 30 g 2   OLANZapine (ZYPREXA) 20 MG tablet Take 1 tablet (20 mg total) by mouth at bedtime. 90 tablet 0   oxyCODONE-acetaminophen (PERCOCET/ROXICET) 5-325 MG tablet Take 1-2 tablets by mouth every 4 (four) hours as needed for moderate pain. 30 tablet 0   PARoxetine (PAXIL) 40 MG tablet Take 1 tablet (40 mg total) by mouth 2 (two) times daily. As of 04/08/22 180 tablet 0   ranolazine (RANEXA) 500 MG 12 hr  tablet TAKE 1 TABLET BY MOUTH TWICE A DAY 180 tablet 3   XIIDRA 5 % SOLN Place 1 drop into both eyes in the morning and at bedtime.     nitroGLYCERIN (NITROSTAT) 0.4 MG SL tablet PLACE 1 TABLET UNDER THE TONGUE EVERY 5 MINUTES AS NEEDED FOR CHEST PAIN. MAX 3. CALL 911 (Patient not taking: Reported on 12/14/2022) 25 tablet 3   No current facility-administered medications for this visit.     Family History    Family History  Problem Relation Age of Onset   Lupus Mother    Heart attack Father    Diabetes Father    Hypertension Father    Diabetes Paternal Grandmother    Hypertension Paternal Grandmother    Stroke Neg Hx    Kidney disease Neg Hx    Hyperlipidemia Neg Hx    Sudden death Neg Hx    She indicated that her mother is deceased. She indicated that her father is deceased. She indicated that the status of her paternal grandmother is unknown. She indicated that the status of her neg hx is unknown.  Social History    Social History   Socioeconomic History   Marital status: Legally Separated    Spouse name: Not on file   Number of children: Not on file   Years of education: Not on file    Highest education level: Some college, no degree  Occupational History   Occupation: Armed forces training and education officer  Tobacco Use   Smoking status: Former    Current packs/day: 0.00    Types: Cigarettes    Start date: 09/12/1996    Quit date: 09/12/2021    Years since quitting: 1.2   Smokeless tobacco: Never   Tobacco comments:    0.5 pack cigarettes  per month per patient   Vaping Use   Vaping status: Never Used  Substance and Sexual Activity   Alcohol use: No    Alcohol/week: 0.0 standard drinks of alcohol   Drug use: Never   Sexual activity: Yes    Partners: Male    Comment: married  Other Topics Concern   Not on file  Social History Narrative   Regular exercise:  3 days weekly   Caffeine Use:  1 soda daily   One child biological daughter born in 26 and an adopted niece.   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          Social Determinants of Health   Financial Resource Strain: Medium Risk (10/05/2022)   Overall Financial Resource Strain (CARDIA)    Difficulty of Paying Living Expenses: Somewhat hard  Food Insecurity: Food Insecurity Present (10/05/2022)   Hunger Vital Sign    Worried About Running Out of Food in the Last Year: Sometimes true    Ran Out of Food in the Last Year: Sometimes true  Transportation Needs: No Transportation Needs (10/05/2022)   PRAPARE - Administrator, Civil Service (Medical): No    Lack of Transportation (Non-Medical): No  Physical Activity: Insufficiently Active (10/05/2022)   Exercise Vital Sign    Days of Exercise per Week: 3 days    Minutes of Exercise per Session: 20 min  Stress: Stress Concern Present (10/05/2022)   [employer] of Occupational Health - Occupational Stress Questionnaire    Feeling of Stress : Rather much  Social Connections: Unknown (10/05/2022)   Social Connection and Isolation Panel [NHANES]    Frequency of Communication with Friends and Family: Twice  a week    Frequency of Social  Gatherings with Friends and Family: Once a week    Attends Religious Services: Patient declined    Database administrator or Organizations: No    Attends Engineer, structural: Not on file    Marital Status: Married  Catering manager Violence: Not on file     Review of Systems    General:  No chills, fever, night sweats or weight changes.  Continues with mild fatigue Cardiovascular:  No chest pain, dyspnea on exertion, edema, orthopnea, palpitations, paroxysmal nocturnal dyspnea. Dermatological: No rash, lesions/masses Respiratory: No cough, dyspnea Urologic: No hematuria, dysuria Abdominal:   No nausea, vomiting, diarrhea, bright red blood per rectum, melena, or hematemesis Neurologic:  No visual changes, wkns, changes in mental status. All other systems reviewed and are otherwise negative except as noted above.       Physical Exam    VS:  BP 118/74   Pulse 90   Ht 5\' 5"  (1.651 m)   Wt 129 lb (58.5 kg)   LMP 11/28/2009   SpO2 100%   BMI 21.47 kg/m  , BMI Body mass index is 21.47 kg/m.     GEN: Well nourished, well developed, in no acute distress. HEENT: normal. Neck: Supple, no JVD, carotid bruits, or masses. Cardiac: RRR, no murmurs, rubs, or gallops. No clubbing, cyanosis, edema.  Radials/DP/PT 2+ and equal bilaterally.  Respiratory:  Respirations regular and unlabored, clear to auscultation bilaterally. GI: Soft, nontender, nondistended, BS + x 4. MS: no deformity or atrophy.  Left catheter insertion site with some ecchymosis noted without bleeding or hematoma palpable pulse. Skin: warm and dry, no rash. Neuro:  Strength and sensation are intact. Psych: Normal affect.      Lab Results  Component Value Date   WBC 10.7 (H) 12/09/2022   HGB 8.9 (L) 12/09/2022   HCT 25.9 (L) 12/09/2022   MCV 87.2 12/09/2022   PLT 148 (L) 12/09/2022   Lab Results  Component Value Date   CREATININE 1.15 (H) 12/09/2022   BUN 16 12/09/2022   NA 138 12/09/2022   K 4.0  12/09/2022   CL 110 12/09/2022   CO2 22 12/09/2022   Lab Results  Component Value Date   ALT 18 11/30/2022   AST 17 11/30/2022   ALKPHOS 95 11/30/2022   BILITOT 0.7 11/30/2022   Lab Results  Component Value Date   CHOL 130 10/05/2022   HDL 47.70 10/05/2022   LDLCALC 70 10/05/2022   LDLDIRECT 145.2 07/18/2008   TRIG 64.0 10/05/2022   CHOLHDL 3 10/05/2022    Lab Results  Component Value Date   HGBA1C 6.3 (H) 12/08/2022     Review of Prior Studies    Cath 04/2021   Diagnostic Dominance: Co-domiant   Assessment & Plan   1.  Coronary artery disease: History of coronary artery bypass grafting in 2006.  Follow-up cardiac catheterization in 2022 revealed 5 grafts were patent.  She continues secondary prevention with lipid management, blood pressure control, purposeful exercise, and low-cholesterol diet.  2.  Peripheral arterial disease: Status post intervention to right common femoral artery, with femoral endarterectomy and insertion of bilateral iliac kissing stents by Bailey Greene on 12/08/2022.  She continues to have soreness in her groins bilaterally, denies pain in her legs and is beginning to walk more and increase her stamina doing so.  She continues on dual antiplatelet therapy with aspirin and Plavix without any issues of bleeding.  To follow-up with Dr.  Hawken postoperatively for any further recommendations.  She is due to have ankle-brachial indices on January 11, 2023.  3.  Type 2 diabetes: Followed by primary care.  4.  Hypercholesterolemia: Remains on high intensity statin with atorvastatin 80 mg daily.  Labs are followed by primary care.  Goal of LDL less than 50 with history of CABG, PAD, and diabetes.  5.  Hypertension: Excellent control of blood pressure today.  No changes in her current medication regimen.         Signed, Bettey Mare. Liborio Nixon, ANP, AACC   12/14/2022 4:21 PM      Office [phone] Fax [phone]  Notice: This dictation was  prepared with Dragon dictation along with smaller phrase technology. Any transcriptional errors that result from this process are unintentional and may not be corrected upon review.

## 2022-12-14 ENCOUNTER — Telehealth: Payer: Self-pay | Admitting: *Deleted

## 2022-12-14 ENCOUNTER — Encounter: Payer: Self-pay | Admitting: Adult Health

## 2022-12-14 ENCOUNTER — Ambulatory Visit: Payer: 59 | Attending: Adult Health | Admitting: Adult Health

## 2022-12-14 VITALS — BP 118/74 | HR 90 | Ht 65.0 in | Wt 129.0 lb

## 2022-12-14 DIAGNOSIS — E7800 Pure hypercholesterolemia, unspecified: Secondary | ICD-10-CM

## 2022-12-14 DIAGNOSIS — I739 Peripheral vascular disease, unspecified: Secondary | ICD-10-CM | POA: Diagnosis not present

## 2022-12-14 DIAGNOSIS — Z951 Presence of aortocoronary bypass graft: Secondary | ICD-10-CM

## 2022-12-14 DIAGNOSIS — I10 Essential (primary) hypertension: Secondary | ICD-10-CM | POA: Diagnosis not present

## 2022-12-14 DIAGNOSIS — I771 Stricture of artery: Secondary | ICD-10-CM | POA: Diagnosis not present

## 2022-12-14 NOTE — Patient Instructions (Signed)
Medication Instructions:  No changes to medication *If you need a refill on your cardiac medications before your next appointment, please call your pharmacy*   Lab Work: none If you have labs (blood work) drawn today and your tests are completely normal, you will receive your results only by: MyChart Message (if you have MyChart) OR A paper copy in the mail If you have any lab test that is abnormal or we need to change your treatment, we will call you to review the results.   Testing/Procedures: none   Follow-Up: At [facility], you and your health needs are our priority.  As part of our continuing mission to provide you with exceptional heart care, we have created designated Provider Care Teams.  These Care Teams include your primary Cardiologist (physician) and Advanced Practice Providers (APPs -  Physician Assistants and Nurse Practitioners) who all work together to provide you with the care you need, when you need it.  We recommend signing up for the patient portal called "MyChart".  Sign up information is provided on this After Visit Summary.  MyChart is used to connect with patients for Virtual Visits (Telemedicine).  Patients are able to view lab/test results, encounter notes, upcoming appointments, etc.  Non-urgent messages can be sent to your provider as well.   To learn more about what you can do with MyChart, go to [email or web address].    Your next appointment:   6 month(s)  Provider:   Rollene Rotunda, MD

## 2022-12-14 NOTE — Telephone Encounter (Signed)
Contacted Bailey Greene (area manager of [facility]) regarding telephone request from [facility] employee after office hours yesterday afternoon 12-13-2022.  Per telephone request Bailey Greene requested to start home health care on January 01, 2023 due to family death.  Delay OK to start home health care on 2023-01-01 given to [facility] via telephone.

## 2022-12-15 ENCOUNTER — Inpatient Hospital Stay: Payer: 59 | Admitting: Family

## 2022-12-20 ENCOUNTER — Other Ambulatory Visit: Payer: Self-pay | Admitting: Family

## 2022-12-20 ENCOUNTER — Telehealth: Payer: Self-pay

## 2022-12-20 ENCOUNTER — Ambulatory Visit: Payer: 59 | Admitting: Family

## 2022-12-20 ENCOUNTER — Other Ambulatory Visit: Payer: Self-pay | Admitting: Psychiatry

## 2022-12-20 ENCOUNTER — Other Ambulatory Visit: Payer: Self-pay | Admitting: Cardiovascular Disease

## 2022-12-20 ENCOUNTER — Encounter: Payer: Self-pay | Admitting: Family

## 2022-12-20 ENCOUNTER — Other Ambulatory Visit: Payer: Self-pay | Admitting: *Deleted

## 2022-12-20 VITALS — BP 106/66 | HR 80 | Temp 98.3°F | Resp 16 | Wt 126.0 lb

## 2022-12-20 DIAGNOSIS — Z87891 Personal history of nicotine dependence: Secondary | ICD-10-CM

## 2022-12-20 DIAGNOSIS — F3162 Bipolar disorder, current episode mixed, moderate: Secondary | ICD-10-CM

## 2022-12-20 DIAGNOSIS — I70213 Atherosclerosis of native arteries of extremities with intermittent claudication, bilateral legs: Secondary | ICD-10-CM

## 2022-12-20 DIAGNOSIS — I739 Peripheral vascular disease, unspecified: Secondary | ICD-10-CM | POA: Diagnosis not present

## 2022-12-20 DIAGNOSIS — F4001 Agoraphobia with panic disorder: Secondary | ICD-10-CM

## 2022-12-20 DIAGNOSIS — E785 Hyperlipidemia, unspecified: Secondary | ICD-10-CM | POA: Diagnosis not present

## 2022-12-20 DIAGNOSIS — F411 Generalized anxiety disorder: Secondary | ICD-10-CM

## 2022-12-20 DIAGNOSIS — E119 Type 2 diabetes mellitus without complications: Secondary | ICD-10-CM | POA: Diagnosis not present

## 2022-12-20 NOTE — Progress Notes (Signed)
Subjective:     Patient ID: Bailey Greene, female    DOB: 01-Nov-1960, 62 y.o.   MRN: [id number]  Chief Complaint  Patient presents with   Hospitalization Follow-up    Here for surgery follow up/ hospital follow up    HPI  Discussed the use of AI scribe software for clinical note transcription with the patient, who gave verbal consent to proceed.  History of Present Illness   The patient, with a history of peripheral vascular disease and diabetes, presents for a post-operative follow-up after a right common femoral artery endarterectomy performed by Dr. [name] on the 10th of the month. She reports a challenging recovery but notes gradual improvement. She denies any new symptoms such as leg cramping or cold feet, indicating an improvement in their pre-operative symptoms. She has a scheduled follow-up with the surgeon.  The patient's diabetes control appears to be good, with a recent HbA1c of 6.5 in May. She also report a significant reduction in smoking, now only smoking approximately once a month.  The patient's mood has been fluctuating due to recent personal stressors, including the passing of their father-in-law shortly after her surgery. This event left her alone at home during the initial recovery period, which she found challenging.  The patient also reports a desire to improve their overall health, including  and increasing their physical activity to at least twenty minutes a day. She express a commitment to continue abstaining from smoking, acknowledging the vascular side effects of the habit.       Health Maintenance Due  Topic Date Due   MAMMOGRAM  03/11/2022    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   Qualifier: Diagnosis of  By: Cheri Guppy     Diabetes mellitus type 2 in nonobese (HCC)  07/2016   Dyslipidemia    Headache(784.0)    History of pulmonary embolism 07/08/2020   HTN (hypertension)    Migraine    NSTEMI (non-ST elevated myocardial infarction) (HCC)    PAD (peripheral artery disease) (HCC)    Pulmonary embolus (HCC)    unprovoked    Past Surgical History:  Procedure Laterality Date   AORTOGRAM N/A 12/08/2022   Procedure: AORTOGRAM;  Surgeon: Leonie Douglas, MD;  Location: [facility] OR;  Service: Vascular;  Laterality: N/A;   BUNIONECTOMY  08/2011   right foot   CARDIAC CATHETERIZATION  2007   severe native 3 v dz, all grafts patent (LIMA-LAD, RIMA-Diag, SVG-OM1-OM2, R radial-PDA)   CESAREAN SECTION  1984   CORONARY ARTERY BYPASS GRAFT  2006    Coronary artery bypass grafting x5 with a left  internal  mammary to the left anterior descending coronary artery.  Free right  internal mammary to the diagonal coronary artery, sequential reverse  saphenous vein graft to the first and second obtuse marginal, right  artery bypass to the posterior descending coronary artery with endo-vein harvesting.   ENDARTERECTOMY FEMORAL Right 12/08/2022   Procedure: RIGHT COMMON FEMORAL ENDARTERECTOMY;  Surgeon: Leonie Douglas, MD;  Location: [facility] OR;  Service: Vascular;  Laterality: Right;   INSERTION OF ILIAC STENT Bilateral 12/08/2022   Procedure: INSERTION OF BILATERAL ILIAC KISSING STENTS USING VIABAHN VBX STENTS;  Surgeon: Leonie Douglas, MD;  Location: [facility] OR;  Service: Vascular;  Laterality: Bilateral;   LEFT HEART CATH AND CORS/GRAFTS  ANGIOGRAPHY N/A 05/05/2021   Procedure: LEFT HEART CATH AND CORS/GRAFTS ANGIOGRAPHY;  Surgeon: Marykay Lex, MD;  Location: [facility] INVASIVE CV LAB;  Service: Cardiovascular;  Laterality: N/A;   ULTRASOUND GUIDANCE FOR VASCULAR ACCESS Left 12/08/2022   Procedure: ULTRASOUND GUIDANCE FOR VASCULAR ACCESS OF LEFT FEMORAL ARTERY;  Surgeon: Leonie Douglas, MD;  Location: [facility] OR;  Service: Vascular;  Laterality: Left;   UMBILICAL HERNIA REPAIR N/A 01/16/2021    Procedure: UMBILICAL  HERNIA REPAIR;  Surgeon: Fritzi Mandes, MD;  Location: [facility] OR;  Service: General;  Laterality: N/A;    Family History  Problem Relation Age of Onset   Lupus Mother    Heart attack Father    Diabetes Father    Hypertension Father    Diabetes Paternal Grandmother    Hypertension Paternal Grandmother    Stroke Neg Hx    Kidney disease Neg Hx    Hyperlipidemia Neg Hx    Sudden death Neg Hx     Social History   Socioeconomic History   Marital status: Legally Separated    Spouse name: Not on file   Number of children: Not on file   Years of education: Not on file   Highest education level: Some college, no degree  Occupational History   Occupation: Armed forces training and education officer  Tobacco Use   Smoking status: Former    Current packs/day: 0.00    Types: Cigarettes    Start date: 09/12/1996    Quit date: 09/12/2021    Years since quitting: 1.2   Smokeless tobacco: Never   Tobacco comments:    0.5 pack cigarettes  per month per patient   Vaping Use   Vaping status: Never Used  Substance and Sexual Activity   Alcohol use: No    Alcohol/week: 0.0 standard drinks of alcohol   Drug use: Never   Sexual activity: Yes    Partners: Male    Comment: married  Other Topics Concern   Not on file  Social History Narrative   Regular exercise:  3 days weeklyCaffeine    Use:  1 soda daily   One child biological daughter born in 19 and an adopted niece.[employer]- Engineer, technical sales   Married- may be divorcing.    Social Determinants of Health   Financial Resource Strain: Medium Risk (10/05/2022)   Overall Financial Resource Strain (CARDIA)    Difficulty of Paying Living Expenses: Somewhat hard  Food Insecurity: Food Insecurity Present (10/05/2022)   Hunger Vital Sign    Worried About Running Out of Food in the Last Year: Sometimes true    Ran Out of Food in the Last Year: Sometimes true  Transportation Needs: No Transportation Needs (10/05/2022)   PRAPARE -  Administrator, Civil Service (Medical): No    Lack of Transportation (Non-Medical): No  Physical Activity: Insufficiently Active (10/05/2022)   Exercise Vital Sign    Days of Exercise per Week: 3 days    Minutes of Exercise per Session: 20 min  Stress: Stress Concern Present (10/05/2022)   [employer] of Occupational Health - Occupational Stress Questionnaire    Feeling of Stress : Rather much  Social Connections: Unknown (10/05/2022)   Social Connection and Isolation Panel [NHANES]    Frequency of Communication with Friends and Family: Twice a week    Frequency of Social Gatherings with Friends and Family: Once a week    Attends Religious Services: Patient declined    Database administrator or Organizations: No  Attends Banker Meetings: Not on file    Marital Status: Married  Catering manager Violence: Not on file    Outpatient Medications Prior to Visit  Medication Sig Dispense Refill   ACCU-CHEK GUIDE test strip USE UP TO FOUR TIMES DAILY AS DIRECTED 400 strip 6   Accu-Chek Softclix Lancets lancets USE UP TO FOUR TIMES DAILY AS DIRECTED 100 each 12   ALPRAZolam (XANAX) 0.5 MG tablet TAKE 1 TABLET (0.5 MG TOTAL) BY MOUTH 3 (THREE) TIMES DAILY AS NEEDED FOR ANXIETY. 90 tablet 0   aspirin 81 MG EC tablet Take 1 tablet (81 mg total) by mouth daily. Swallow whole. 30 tablet 12   atorvastatin (LIPITOR) 80 MG tablet TAKE 1 TABLET BY MOUTH DAILY 90 tablet 3   BD PEN NEEDLE NANO U/F 32G X 4 MM MISC USE AS DIRECTED 300 each 1   blood glucose meter kit and supplies KIT Dispense based on patient and insurance preference. Use up to four times daily as directed. (FOR ICD-9 250.00, 250.01). 1 each 0   busPIRone (BUSPAR) 30 MG tablet Take 30 mg by mouth daily.     Cholecalciferol (VITAMIN D3) 75 MCG (3000 UT) TABS Take 1 tablet by mouth daily at 6 (six) AM. 30 tablet    cilostazol (PLETAL) 100 MG tablet Take 1 tablet (100 mg total) by mouth 2 (two) times daily before a  meal. 60 tablet 11   cloNIDine (CATAPRES) 0.1 MG tablet 1 in the AM and 3 PM and 1 extra daily as needed for anxiety 270 tablet 0   clopidogrel (PLAVIX) 75 MG tablet TAKE 1 TABLET BY MOUTH EVERY DAY WITH BREAKFAST 90 tablet 3   Continuous Glucose Receiver (DEXCOM G6 RECEIVER) DEVI Use as directed 1 each 0   Continuous Glucose Sensor (DEXCOM G6 SENSOR) MISC USE AS DIRECTED 3 each 1   Continuous Glucose Transmitter (DEXCOM G6 TRANSMITTER) MISC USE AS DIRECTED 1 each 1   FARXIGA 10 MG TABS tablet TAKE 1 TABLET BY MOUTH DAILY BEFORE BREAKFAST 30 tablet 11   insulin degludec (TRESIBA FLEXTOUCH) 100 UNIT/ML FlexTouch Pen Inject 12 Units into the skin daily. 3 mL 5   isosorbide mononitrate (IMDUR) 120 MG 24 hr tablet TAKE 1 TABLET BY MOUTH EVERY DAY 90 tablet 3   JANUVIA 50 MG tablet TAKE 1 TABLET BY MOUTH EVERY DAY 90 tablet 1   lisinopril (ZESTRIL) 5 MG tablet TAKE 1 TABLET (5 MG TOTAL) BY MOUTH DAILY. 90 tablet 1   metoprolol succinate (TOPROL-XL) 25 MG 24 hr tablet TAKE 1 TABLET BY MOUTH EVERY DAY *KEEP APPOINTMENT FOR REFILLS* 90 tablet 3   mupirocin ointment (BACTROBAN) 2 % Apply 1 application. topically 2 (two) times daily. 30 g 2   nitroGLYCERIN (NITROSTAT) 0.4 MG SL tablet PLACE 1 TABLET UNDER THE TONGUE EVERY 5 MINUTES AS NEEDED FOR CHEST PAIN. MAX 3. CALL 911 25 tablet 3   OLANZapine (ZYPREXA) 20 MG tablet Take 1 tablet (20 mg total) by mouth at bedtime. 90 tablet 0   oxyCODONE-acetaminophen (PERCOCET/ROXICET) 5-325 MG tablet Take 1-2 tablets by mouth every 4 (four) hours as needed for moderate pain. 30 tablet 0   PARoxetine (PAXIL) 40 MG tablet Take 1 tablet (40 mg total) by mouth 2 (two) times daily. As of 04/08/22 180 tablet 0   ranolazine (RANEXA) 500 MG 12 hr tablet TAKE 1 TABLET BY MOUTH TWICE A DAY 180 tablet 3   XIIDRA 5 % SOLN Place 1 drop into both eyes in the  morning and at bedtime.     No facility-administered medications prior to visit.    Allergies  Allergen Reactions    Metformin And Related Nausea And Vomiting    ROS    See HPI  Objective:    Physical Exam Constitutional:      General: She is not in acute distress.    Appearance: Normal appearance. She is well-developed.  HENT:     Head: Normocephalic and atraumatic.     Right Ear: External ear normal.     Left Ear: External ear normal.  Eyes:     General: No scleral icterus. Neck:     Thyroid: No thyromegaly.  Cardiovascular:     Rate and Rhythm: Normal rate and regular rhythm.     Pulses:          Femoral pulses are 2+ on the right side and 2+ on the left side.    Heart sounds: Normal heart sounds. No murmur heard. Pulmonary:     Effort: Pulmonary effort is normal. No respiratory distress.     Breath sounds: Normal breath sounds. No wheezing.  Musculoskeletal:        General: No swelling.     Cervical back: Neck supple.  Skin:    General: Skin is warm and dry.     Comments: Right groin incision with steri strips- clean dry and intact, no erythema  Neurological:     Mental Status: She is alert and oriented to person, place, and time.  Psychiatric:        Mood and Affect: Mood normal.        Behavior: Behavior normal.        Thought Content: Thought content normal.        Judgment: Judgment normal.      BP 106/66 (BP Location: Right Arm, Patient Position: Sitting, Cuff Size: Small)   Pulse 80   Temp 98.3 F (36.8 C) (Oral)   Resp 16   Wt 126 lb (57.2 kg)   LMP 11/28/2009   SpO2 100%   BMI 20.97 kg/m  Wt Readings from Last 3 Encounters:  12/20/22 126 lb (57.2 kg)  12/14/22 129 lb (58.5 kg)  12/08/22 128 lb (58.1 kg)       Assessment & Plan:   Problem List Items Addressed This Visit       Unprioritized   PAD (peripheral artery disease) (HCC) - Primary    Clinically stable post operatively. No signs of infection or complications. No symptoms of cramping or cold feet. Follow-up with surgeon scheduled for 01/11/2023. -Continue current post-operative care. -Keep  follow-up appointment with surgeon. -continue plavix and Pletal       Hyperlipidemia with target LDL less than 70    Lab Results  Component Value Date   CHOL 130 10/05/2022   HDL 47.70 10/05/2022   LDLCALC 70 10/05/2022   LDLDIRECT 145.2 07/18/2008   TRIG 64.0 10/05/2022   CHOLHDL 3 10/05/2022   Lipids stable on lipitor 80mg .       History of tobacco abuse    Reduced to approximately one cigarette per month. -Continue efforts to quit smoking completely.       Diabetes mellitus type 2 in nonobese [facility])    Lab Results  Component Value Date   HGBA1C 6.3 (H) 12/08/2022   HGBA1C 6.5 10/05/2022   HGBA1C 6.7 (H) 07/06/2022   Lab Results  Component Value Date   MICROALBUR <0.7 02/25/2022   LDLCALC 70 10/05/2022   CREATININE 1.15 (  H) 12/09/2022   Last A1C at goal. No changes to current regimen.        I am having Bailey Greene maintain her Benay Spice, blood glucose meter kit and supplies, aspirin EC, mupirocin ointment, Accu-Chek Softclix Lancets, metoprolol succinate, cilostazol, atorvastatin, BD Pen Needle Nano U/F, Farxiga, Januvia, Tresiba FlexTouch, Vitamin D3, isosorbide mononitrate, Accu-Chek Guide, cloNIDine, OLANZapine, Dexcom G6 Transmitter, Dexcom G6 Sensor, Dexcom G6 Receiver, ranolazine, PARoxetine, ALPRAZolam, busPIRone, clopidogrel, lisinopril, nitroGLYCERIN, and oxyCODONE-acetaminophen.  No orders of the defined types were placed in this encounter.

## 2022-12-20 NOTE — Assessment & Plan Note (Signed)
Lab Results  Component Value Date   HGBA1C 6.3 (H) 12/08/2022   HGBA1C 6.5 10/05/2022   HGBA1C 6.7 (H) 07/06/2022   Lab Results  Component Value Date   MICROALBUR <0.7 02/25/2022   LDLCALC 70 10/05/2022   CREATININE 1.15 (H) 12/09/2022   Last A1C at goal. No changes to current regimen.

## 2022-12-20 NOTE — Assessment & Plan Note (Signed)
Reduced to approximately one cigarette per month. -Continue efforts to quit smoking completely.

## 2022-12-20 NOTE — Assessment & Plan Note (Signed)
Lab Results  Component Value Date   CHOL 130 10/05/2022   HDL 47.70 10/05/2022   LDLCALC 70 10/05/2022   LDLDIRECT 145.2 07/18/2008   TRIG 64.0 10/05/2022   CHOLHDL 3 10/05/2022   Lipids stable on lipitor 80mg .

## 2022-12-20 NOTE — Assessment & Plan Note (Addendum)
Clinically stable post operatively. No signs of infection or complications. No symptoms of cramping or cold feet. Follow-up with surgeon scheduled for 01/11/2023. -Continue current post-operative care. -Keep follow-up appointment with surgeon. -continue plavix and Pletal

## 2022-12-20 NOTE — Telephone Encounter (Signed)
Pt contacted nurse to report her last office note from 12/06/2022 needs to be faxed to [facility] [phone].  Note faxed per request, Dr. Jennelle Human extended FMLA for an additional 8 weeks.

## 2022-12-20 NOTE — Patient Instructions (Signed)
VISIT SUMMARY:  During your visit, we discussed your recovery from femoral artery surgery, your diabetes management, your reduced smoking, your mood fluctuations, and your desire to improve your overall health. You are showing good progress in your recovery and your diabetes is well-controlled. You have also made significant strides in reducing your smoking habit. We also discussed your recent mood fluctuations due to personal stressors and your commitment to improving your overall health.  YOUR PLAN:  -POST-OPERATIVE STATUS: You are recovering well from your femoral artery surgery with no signs of infection or complications. It's important to continue your current post-operative care and keep your follow-up appointment with your surgeon.  -TOBACCO USE: You have reduced your smoking to approximately one cigarette per month. It's great that you're making progress, but it's important to continue your efforts to quit smoking completely.  -TYPE 2 DIABETES MELLITUS: Your diabetes is well-controlled, as shown by your last A1c in May being 6.5. Continue taking your Evaristo Bury medication and we will follow-up in one month for diabetes management.  -ANEMIA: You have some signs of anemia, which is likely due to your recent surgery. To help prevent this, start taking a multivitamin with minerals once daily and eat a well-balanced diet. We will check your labs at your next visit.  -MOOD: You have been experiencing mood fluctuations due to recent personal stressors. We will monitor this and provide support as needed.  -EXERCISE: You are aiming to exercise for at least 20 minutes per day. This is a great goal and we encourage you to continue with this regimen.  INSTRUCTIONS:  Please continue with your current post-operative care and keep your follow-up appointment with your surgeon. Continue your efforts to quit smoking completely. Keep taking your Evaristo Bury medication for your diabetes and we will follow-up in one  month. Start taking a multivitamin with minerals once daily and eat a well-balanced diet to help prevent anemia. We will check your labs at your next visit. If your mood fluctuations continue or worsen, please let us know so we can provide support. Continue with your exercise regimen of at least 20 minutes per day.

## 2022-12-21 ENCOUNTER — Other Ambulatory Visit: Payer: Self-pay | Admitting: Psychiatry

## 2022-12-21 DIAGNOSIS — F3162 Bipolar disorder, current episode mixed, moderate: Secondary | ICD-10-CM

## 2022-12-21 DIAGNOSIS — F411 Generalized anxiety disorder: Secondary | ICD-10-CM

## 2022-12-21 DIAGNOSIS — F4001 Agoraphobia with panic disorder: Secondary | ICD-10-CM

## 2022-12-31 ENCOUNTER — Telehealth: Payer: Self-pay

## 2022-12-31 ENCOUNTER — Telehealth: Payer: Self-pay | Admitting: Cardiology

## 2022-12-31 ENCOUNTER — Ambulatory Visit (INDEPENDENT_AMBULATORY_CARE_PROVIDER_SITE_OTHER): Payer: 59 | Admitting: Psychiatry

## 2022-12-31 ENCOUNTER — Encounter: Payer: Self-pay | Admitting: Psychiatry

## 2022-12-31 DIAGNOSIS — F4001 Agoraphobia with panic disorder: Secondary | ICD-10-CM | POA: Diagnosis not present

## 2022-12-31 DIAGNOSIS — F411 Generalized anxiety disorder: Secondary | ICD-10-CM | POA: Diagnosis not present

## 2022-12-31 DIAGNOSIS — F3162 Bipolar disorder, current episode mixed, moderate: Secondary | ICD-10-CM

## 2022-12-31 DIAGNOSIS — F5105 Insomnia due to other mental disorder: Secondary | ICD-10-CM | POA: Diagnosis not present

## 2022-12-31 MED ORDER — ALPRAZOLAM 0.5 MG PO TABS
0.5000 mg | ORAL_TABLET | Freq: Three times a day (TID) | ORAL | 0 refills | Status: DC | PRN
Start: 2022-12-31 — End: 2023-02-11

## 2022-12-31 NOTE — Telephone Encounter (Signed)
Noted  

## 2022-12-31 NOTE — Telephone Encounter (Signed)
Caller wanted to introduce herself as patient's Horticulturist, commercial.

## 2022-12-31 NOTE — Progress Notes (Signed)
Bailey Greene [id number] [birth year] 62 y.o.   Subjective:   Patient ID:  Bailey Greene is a 62 y.o. (DOB 1960/11/10) female.  Chief Complaint:  Chief Complaint  Patient presents with   Follow-up   Depression   Anxiety    Anxiety Symptoms include nervous/anxious behavior. Patient reports no decreased concentration, dizziness, nausea or palpitations.     Bailey Greene presents for follow-up of bipolar disorder and panic attacks and general anxiety.  visit 5/22,2020.  Increased Depakote then to 1500 mg daily.  Boss rec LOA for 4 weeks.  Going through separation is really tough.  Panic attacks at work interfering.  Had this job 10 years.  Likes the job but hard to concentrate and stay focused.  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  Panic incr pulse and SOB, fear, sweating and shakey.  Panic lasts 20 mins and increased frequency.  Several in a day.  Going on for 2 mos but getting worse.  Boss can tell from listening to her calls and drop in production.  At follow up visit November 22, 2018.  Mood swings are better.  Trouble staying asleep.  Caffeine 1 coffee and 1 soda daily.She was still having severe anxiety plus panic.  We discussed the risk of SSRIs trickling triggering mood swings but the severity of the anxiety was such that we decided to initiate fluoxetine 10 mg daily to increase to 20 mg daily.  We also were using low-dose mirtazapine to help with sleep.  seen January 18, 2019.  She was granted medical leave for panic symptoms.  She was switched to paroxetine for panic from fluoxetine.  Since she was switched from mirtazapine to quetiapine 25 to 50 mg nightly for insomnia.   November 2020 visit with the following noted: It is helping some with anxiety but a lot of stress.  No unusual mood swings without a change.   She's satisfied with the 20 mg paroxetine. Sleep is much better with quetiapine and xanax at night.  5 hour and sometimes better depending on work schedule.    No meds were changed.    10/23/2019 visit with the following noted: Anxiety, dep, panic attacks all worse lately for 2 mos.  Anger also worse mostly just at home bc manages it at work.  Several triggers.  Sister passed March 26 after illness.  Work and daughter are stressful.  Overwhelming. Not as good staying asleep.  Random panic.  Feels SOB.  Wanting to isolate. Spontaneous crying spell.s Poor concentration affecting work.    Plan: Increase paroxetine to 1-1/2 of the 20 mg tablets daily For sleep increase quetiapine to 2 the 25 mg tablets nightly  03/05/20 appt with following noted: Less anxious and sleeping is better.  Panic can be triggered at work with SOB and heart racing.  Happens about 2 times weekly. Main stress is work is overwhelming.  Talk with customers all day.   No clear mania.   Still some depression too. No SE Plan: no med changes except increase paroxetine to 40 for anxiety  09/02/2020 appt noted: Dx DM since here. Anxiety and panic worse since January.  Consistent with meds.  Stress dx DM.  Sister passed away.  Panic with SOB and occ sweats.  Panic daily. Usually before noon usually with trigger. Xanax makes her relax. 1 cup coffee AM.   Sleep 4-5 hours with quetiapine 25 mg.   And pretty consistent. Mood feels labile.  About to lose it including irritable and angry.  Plan: Increase Quetiapine 25 mg 2 mg HS.   12/10/20 appt noted: Dizziniess for awhile after paroxetine resolved. Still having anxiety and panic but not daily.   Average 7/10 anxiety usually triggered with work as primary stress.  She and H still dealing with problems.  Panic worse either in the AM or evening. Sleep is better 4-5 hours nightly. Taking quetiapine 50 mg with Xanax at night. Still on Depakote ER 1500, busipirone 30 BID , paroxetine 40 mg daily No anger problems at work.  Appetite is better.  Regaining some weight. Evening walks helps mental health. Plan: Start olanzapine 5 mg daily and if  that is not sufficient within a week increase to 10 mg nightly.  We can go higher if needed and call if necessary. BC failure of alternatives, alprazolam 0.5 mg AM before work.   02/18/2021 appointment with the following noted: Anx down to 4-5/10.  Notices calmer with Xanax and throughout the day.Less anxiety and panic at work but getting better. Depression about the same 6/10.  Tries to distract herself from problems at home.. Sleep 6 hours now with olanzapine.   No SE Plan: increase olanzapine 15 mg HS. BC failure of alternatives, alprazolam 0.5 mg AM before work.   paroxetine to 40 mg at night.  04/28/2021 appointment with the following noted: Increase olanzapine 15 mg HS helped awhile with initial dizziness resolved.  Not drowsy. Still better than it was but holidays are hard.  Better than in a long time overall.  H saw a difference also.   Sleep 5-6 hours.  Never been a 7 hour sleeper.   Eating is normal now. Depression still there but better also 3/10.  Can enjoy some things. Better interest. Most stressful thing about work is the work load and talking with customers who  are difficult.  Had this job 13 years. Plan: Increase olanzapine 20 mg 3 hours before HS.  06/10/2021 phone call complaining of persistent insomnia.  She was allowed to increase alprazolam to 0.5 mg every morning and 1 mg nightly  06/30/2021 appointment with the following noted: Taking alprazolam 0.5 mg 2 daily usually. Increased olanzapine to 20 mg daily. CABG 2006.  Valve problem with CP and hospitalized.  Change in med. Going better now. No problems with meds.   About 6 hour sleep and in bed earlier.  Depression is OK overall but residual anxiety and crying.  Anxiety around health now too and work. Panic is not gone but better on Paroxetine 20 Sometimes brief irritability situationally. Plan: No med changes.  Continue olanzapine 20 mg, paroxetine 40 mg, alprazolam 0.5 mg twice daily as needed  11/18/2021  appointment with the following noted: At one point since here anxiety got a lot worse but getting better now.  Some panic attacks including Saturday.   Compliant.  More work stress trying to get caught up.   Only taking alprazolam 0.5 mg HS and not in daytime.  Not sleepy with it daytime. No SE No depression or anxiety.   Plan: no med changes  04/08/22 TC:  increased anxiety and panic wanting med changes, so increase paroxetine to 60 mg daily  04/14/22 appt noted: Current meds alprazolam 0.5 mg 3 times daily as needed anxiety, buspirone 30 mg twice daily, Depakote ER 1500 mg nightly,, Olanzapine 20 mg nightly, quetiapine 25 mg nightly for sleep, paroxetine 60 mg as of 04/08/22 Anxiety and panic worse for a month and missed some work and then taken out of work by PCP  03/30/22. Sx including panic with SOB. At least 2-3 times per day.  Worrying about everything she has to do including doctor's appts needs eye surgery.  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 Having to talk with customers all day triggers panic and can't talk during panic attacks. Even outside of work trouble getting things done Dt low energy and anxiety.  Everything closing in at one tgime and trouble breathing.  Feel overwhelmed all the time.   Also depression and has to push herself to go out of the house and low energy. Sleep is about 5-6 hours per night.   Wants to stay out of work until 12/22 to give time for doctor appts and get herself together. Current job 13 years. No med changes  05/17/22 appt noted: Anxiety is awful right now.  Hard to breath.  Heart races at times with panic.  Mind races.   2-3 panic attacks daily. SE maybe some sleepiness. Sleep about 4-5 hours.  Can't calm her mind down.   No recent counselors but has appt on 12/27 pending. Still out of work.  [place] asking about the TX plan  Plan: DC buspirone Off label clonidine and increase to 0.1 mg BID for severe anxiety and panic   06/29/2022  appointment noted: Current psychiatric medication clonidine 0.2 mg daily, Depakote ER 1500 nightly, alprazolam 0.5 mg twice daily as needed anxiety, olanzapine 20 mg nightly, paroxetine 60 mg daily, quetiapine 25 mg nightly Clonidine is the first time that seen a change with anxiety.  It immediately helps.  Checking BP and all systolic pressures above 100. Better response with BID dosing.  Less SOB.  Less irritable.   SE dry mouth, no others. Sleep is better  but still only 4-5 hours.   Depression is gradually getting better but not there yet. Back at work and clonidine helps her deal with the pressure better there.  Handling it better.  Work function is pretty good with constant volume.  Before was SOB bc couldn't relax to breathe. 2 therapy appts. Plan: Consider further increase olanzapine if necessary Continue olanzapine 20 mg 3 hours before HS. Continue Depakote ER 1500 mg HS Off label clonidine 0.1 mg BID for severe anxiety and panic has helped reduce anxiety by over 50% She didn't each or drink much today and BP borderline wihtout  SX.  Push fluids. Increased paroxetine to 60 mg daily as of 04/08/22 for worsening panic. No better so far with increased dose. BC failure of alternatives, alprazolam 0.5 mg AM before work twice daily.   08/31/22 appt noted: Stopped Depakote last vist and only alprazolam 0.5 mg HS now. MedS: In a much better place .  Still anxious but not like it used to be.  Couldn't control it before.  Less SOB. Still some panic but not as easy.  Still get triggered at work or home that realizes she should not get as upset over.  Easily overwhelmed with high demands. Sleep is better about 5-6 hours. Was having NM but less now.  Was waking H with them but not now. No SE noted.   BP usally above 100/70.  Not lightheaded or dizzy with standing. Plan: no med changes  11/01/22 apt noted: Psych meds: Alprazolam 0.5 mg 3 times daily as needed, clonidine 0.1 mg twice daily and 1  extra as needed anxiety, olanzapine 20 mg nightly, paroxetine 60 mg daily. I feel like I'm having a breakdown with anxiety.  Overwhelmed with everything with multiple stressors. Worse about  2 weeks.  Additional stressors at work.  More mistakes at work.  Overwhelmed.  Some criticism from boss who notices the decline in function. Uncontrollable crying spells.  Tired.  More depressed and anxious.   ? Blood clot in leg.   Feeling more angry and poor conc with pressure at work.  Usually able to function at work but right now not good.  Feels like she will breakdown and lose it.  No SI but dep a lot.  Don't feel like doing as much as usual.  Feels SOB and having panic at work.   Not sleeping as much as she should but not insomnia. No SE.   Plan: OK FMLA .  Out of work until July 9 when has FU appt. Increase paroxetine to high dose 80 for TR anxiety and depression.    12/06/22 appt noted: Psych meds: as above.  Paroxetine 80 mg daily. Buspirone 30 mg daily. Alprazolam 0.5 mg 3 times daily as needed, clonidine 0.1 mg twice daily and 1 extra as needed anxiety, olanzapine 20 mg nightly Not much change in dep or anxiety.  Will have stent placed in artery in R leg on 7/10 for claudication.  Creating stress and anxiety from that also. Anxiety worse than dep.  Having panic attacks with SOB daily.  Created by worry over health and job and life ongoing.  Even though tries to relax anxiety takes over.    Sleep is not as good 4-5 hours with worry.   Consistent with meds and no SE  12/31/22 appt noted: Psych meds as above.  Has been on paroxetine 80 mg a day for 8 weeks.  Continues buspirone 30 mg daily, alprazolam 0.5 mg 3 times daily as needed, clonidine 0.1 mg twice daily, olanzapine 20 mg nightly. Consistent and no SE. It is getting better.  Less panic and anxiety.  Still some but not as severe. Panic is under control.  Anxiety is still an issue.  It is improving but fears relapse and not confident. Sleep a  little better 5-6 hours.   Stent placement in leg R for vascular px successful. Plans to RTW 01/19/23.   She's satisfied with meds.  PDMP only shows Xanax.  She denies abusing substances  Past Psychiatric Medication Trials: Hydroxyzine, mirtazapine poor response for sleep,   Depakote 1500,  quetiapine low-dose for sleep Olanzapine 20  duloxetine, citalopram, Wellbutrin, fluoxetine, paroxetine 80 Clonidine 0.1 BID Xanax,  Buspirone NR  Notes and chart were reviewed with the patient regarding prior history and symptoms.  Review of Systems:  Review of Systems  Constitutional:  Positive for fatigue.  Cardiovascular:  Positive for leg swelling. Negative for palpitations.  Gastrointestinal:  Negative for nausea.  Musculoskeletal:  Positive for myalgias.  Neurological:  Negative for dizziness, tremors and light-headedness.  Psychiatric/Behavioral:  Positive for dysphoric mood. Negative for decreased concentration and sleep disturbance. The patient is nervous/anxious.     Medications: I have reviewed the patient's current medications.  Current Outpatient Medications  Medication Sig Dispense Refill   ACCU-CHEK GUIDE test strip USE UP TO FOUR TIMES DAILY AS DIRECTED 400 strip 6   Accu-Chek Softclix Lancets lancets USE UP TO FOUR TIMES DAILY AS DIRECTED 100 each 12   aspirin 81 MG EC tablet Take 1 tablet (81 mg total) by mouth daily. Swallow whole. 30 tablet 12   atorvastatin (LIPITOR) 80 MG tablet TAKE 1 TABLET BY MOUTH DAILY 90 tablet 3   BD PEN NEEDLE NANO U/F 32G X 4 MM  MISC USE AS DIRECTED 300 each 1   blood glucose meter kit and supplies KIT Dispense based on patient and insurance preference. Use up to four times daily as directed. (FOR ICD-9 250.00, 250.01). 1 each 0   busPIRone (BUSPAR) 30 MG tablet Take 30 mg by mouth daily.     Cholecalciferol (VITAMIN D3) 75 MCG (3000 UT) TABS Take 1 tablet by mouth daily at 6 (six) AM. 30 tablet    cilostazol (PLETAL) 100 MG tablet Take 1  tablet (100 mg total) by mouth 2 (two) times daily before a meal. 60 tablet 11   cloNIDine (CATAPRES) 0.1 MG tablet 1 in the AM and 3 PM and 1 extra daily as needed for anxiety 270 tablet 0   clopidogrel (PLAVIX) 75 MG tablet TAKE 1 TABLET BY MOUTH EVERY DAY WITH BREAKFAST 90 tablet 3   Continuous Glucose Receiver (DEXCOM G6 RECEIVER) DEVI Use as directed 1 each 0   Continuous Glucose Sensor (DEXCOM G6 SENSOR) MISC USE AS DIRECTED 3 each 1   Continuous Glucose Transmitter (DEXCOM G6 TRANSMITTER) MISC USE AS DIRECTED 1 each 1   FARXIGA 10 MG TABS tablet TAKE 1 TABLET BY MOUTH DAILY BEFORE BREAKFAST 30 tablet 11   insulin degludec (TRESIBA FLEXTOUCH) 100 UNIT/ML FlexTouch Pen Inject 12 Units into the skin daily. 3 mL 5   isosorbide mononitrate (IMDUR) 120 MG 24 hr tablet TAKE 1 TABLET BY MOUTH EVERY DAY 90 tablet 3   JANUVIA 50 MG tablet TAKE 1 TABLET BY MOUTH EVERY DAY 90 tablet 1   lisinopril (ZESTRIL) 5 MG tablet TAKE 1 TABLET (5 MG TOTAL) BY MOUTH DAILY. 90 tablet 1   metoprolol succinate (TOPROL-XL) 25 MG 24 hr tablet TAKE 1 TABLET BY MOUTH EVERY DAY *KEEP APPOINTMENT FOR REFILLS* 90 tablet 3   mupirocin ointment (BACTROBAN) 2 % Apply 1 application. topically 2 (two) times daily. 30 g 2   nitroGLYCERIN (NITROSTAT) 0.4 MG SL tablet PLACE 1 TABLET UNDER THE TONGUE EVERY 5 MINUTES AS NEEDED FOR CHEST PAIN. MAX 3. CALL 911 25 tablet 3   OLANZapine (ZYPREXA) 20 MG tablet TAKE 1 TABLET BY MOUTH EVERYDAY AT BEDTIME 30 tablet 0   oxyCODONE-acetaminophen (PERCOCET/ROXICET) 5-325 MG tablet Take 1-2 tablets by mouth every 4 (four) hours as needed for moderate pain. 30 tablet 0   PARoxetine (PAXIL) 40 MG tablet TAKE 1 TABLET BY MOUTH TWICE A DAY 60 tablet 0   ranolazine (RANEXA) 500 MG 12 hr tablet TAKE 1 TABLET BY MOUTH TWICE A DAY 180 tablet 3   XIIDRA 5 % SOLN Place 1 drop into both eyes in the morning and at bedtime.     ALPRAZolam (XANAX) 0.5 MG tablet Take 1 tablet (0.5 mg total) by mouth 3 (three)  times daily as needed for anxiety. 90 tablet 0   No current facility-administered medications for this visit.    Medication Side Effects: ? 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   Qualifier: Diagnosis of  By: Cheri Guppy     Diabetes mellitus type 2 in nonobese (HCC) 07/2016   Dyslipidemia    Headache(784.0)    History of pulmonary embolism 07/08/2020   HTN (hypertension)  Migraine    NSTEMI (non-ST elevated myocardial infarction) (HCC)    PAD (peripheral artery disease) (HCC)    Pulmonary embolus (HCC)    unprovoked    Family History  Problem Relation Age of Onset   Lupus Mother    Heart attack Father    Diabetes Father    Hypertension Father    Diabetes Paternal Grandmother    Hypertension Paternal Grandmother    Stroke Neg Hx    Kidney disease Neg Hx    Hyperlipidemia Neg Hx    Sudden death Neg Hx     Social History   Socioeconomic History   Marital status: Legally Separated    Spouse name: Not on file   Number of children: Not on file   Years of education: Not on file   Highest education level: Some college, no degree  Occupational History   Occupation: Armed forces training and education officer  Tobacco Use   Smoking status: Former    Current packs/day: 0.00    Types: Cigarettes    Start date: 09/12/1996    Quit date: 09/12/2021    Years since quitting: 1.3   Smokeless tobacco: Never   Tobacco comments:    0.5 pack cigarettes  per month per patient   Vaping Use   Vaping status: Never Used  Substance and Sexual Activity   Alcohol use: No    Alcohol/week: 0.0 standard drinks of alcohol   Drug use: Never   Sexual activity: Yes    Partners: Male    Comment: married  Other  Topics Concern   Not on file  Social History Narrative   Regular exercise:  3 days weeklyCaffeine    Use:  1 soda daily   One child biological daughter born in 82 and an adopted niece.[employer]- Engineer, technical sales   Married- may be divorcing.    Social Determinants of Health   Financial Resource Strain: Medium Risk (10/05/2022)   Overall Financial Resource Strain (CARDIA)    Difficulty of Paying Living Expenses: Somewhat hard  Food Insecurity: Food Insecurity Present (10/05/2022)   Hunger Vital Sign    Worried About Running Out of Food in the Last Year: Sometimes true    Ran Out of Food in the Last Year: Sometimes true  Transportation Needs: No Transportation Needs (10/05/2022)   PRAPARE - Administrator, Civil Service (Medical): No    Lack of Transportation (Non-Medical): No  Physical Activity: Insufficiently Active (10/05/2022)   Exercise Vital Sign    Days of Exercise per Week: 3 days    Minutes of Exercise per Session: 20 min  Stress: Stress Concern Present (10/05/2022)   [employer] of Occupational Health - Occupational Stress Questionnaire    Feeling of Stress : Rather much  Social Connections: Unknown (10/05/2022)   Social Connection and Isolation Panel [NHANES]    Frequency of Communication with Friends and Family: Twice a week    Frequency of Social Gatherings with Friends and Family: Once a week    Attends Religious Services: Patient declined    Database administrator or Organizations: No    Attends Engineer, structural: Not on file    Marital Status: Married  Catering manager Violence: Not on file    Past Medical History, Surgical history, Social history, and Family history were reviewed and updated as appropriate.   Please see review of systems for further details on the patient's review from today.   Objective:   Physical Exam:  LMP 11/28/2009  Physical Exam Constitutional:      General: She is not in acute distress.     Appearance: She is well-developed.  Musculoskeletal:        General: No deformity.  Neurological:     Mental Status: She is alert and oriented to person, place, and time.     Coordination: Coordination normal.  Psychiatric:        Attention and Perception: She is attentive. She does not perceive auditory hallucinations.        Mood and Affect: Mood is anxious and depressed. Affect is not labile, blunt, angry or tearful.        Speech: Speech normal. Speech is not rapid and pressured.        Behavior: Behavior normal. Behavior is not agitated.        Thought Content: Thought content normal. Thought content is not paranoid or delusional. Thought content does not include homicidal or suicidal ideation.        Cognition and Memory: Cognition normal.        Judgment: Judgment normal.     Comments: Insight intact. No auditory or visual hallucinations. No delusions.  Improving anxiety , panic, depression. Not resolved.     Lab Review:     Component Value Date/Time   NA 138 12/09/2022 0330   NA 141 07/23/2020 1300   K 4.0 12/09/2022 0330   CL 110 12/09/2022 0330   CO2 22 12/09/2022 0330   GLUCOSE 129 (H) 12/09/2022 0330   BUN 16 12/09/2022 0330   BUN 7 07/23/2020 1300   CREATININE 1.15 (H) 12/09/2022 0330   CREATININE 0.72 06/22/2012 0840   CALCIUM 9.0 12/09/2022 0330   PROT 6.9 11/30/2022 1351   PROT 6.6 03/29/2022 1504   ALBUMIN 3.9 11/30/2022 1351   ALBUMIN 4.6 03/29/2022 1504   AST 17 11/30/2022 1351   ALT 18 11/30/2022 1351   ALKPHOS 95 11/30/2022 1351   BILITOT 0.7 11/30/2022 1351   BILITOT 0.4 03/29/2022 1504   GFRNONAA 55 (L) 12/09/2022 0330   GFRNONAA >89 06/22/2012 0840   GFRAA 102 07/23/2020 1300   GFRAA >89 06/22/2012 0840       Component Value Date/Time   WBC 10.7 (H) 12/09/2022 0330   RBC 2.97 (L) 12/09/2022 0330   HGB 8.9 (L) 12/09/2022 0330   HCT 25.9 (L) 12/09/2022 0330   PLT 148 (L) 12/09/2022 0330   MCV 87.2 12/09/2022 0330   MCH 30.0 12/09/2022  0330   MCHC 34.4 12/09/2022 0330   RDW 13.8 12/09/2022 0330   LYMPHSABS 2.2 02/25/2022 0950   MONOABS 0.4 02/25/2022 0950   EOSABS 0.3 02/25/2022 0950   BASOSABS 0.0 02/25/2022 0950    No results found for: "POCLITH", "LITHIUM"   No results found for: "PHENYTOIN", "PHENOBARB", "VALPROATE", "CBMZ"   .res Assessment: Plan:    Bailey Greene was seen today for follow-up, depression and anxiety.  Diagnoses and all orders for this visit:  Moderate mixed bipolar I disorder (HCC)  Panic disorder with agoraphobia -     ALPRAZolam (XANAX) 0.5 MG tablet; Take 1 tablet (0.5 mg total) by mouth 3 (three) times daily as needed for anxiety.  Generalized anxiety disorder  Insomnia due to mental condition   Chronic work stress  30 min face to face time with patient was spent on counseling and coordination of care. We discussed the following:    Repeated bouts of STD DT anxiety and depression. However she is more anxious and depressed with panic lately affecting work.  She is getting better and will return to work in about 3 weeks.  Paroxetine gradually helping. Also recent surgery  Continue olanzapine 20 mg 3 hours before HS. Stoppped Depakote ER without difficulty.  Consider option of Abilify aug but might not sleep if olanazaine stopped.  Off label clonidine 0.1 mg BID for severe anxiety and panic has helped reduce anxiety by over 50% Can't increase clonidine bc BP borderline wihtout  SX.  Push fluids.  Continue high dose for another month or so bc might need more time to get max bnefit.  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   Disc SE in detail and SSRI withdrawal sx.  BC failure of alternatives, alprazolam 0.5 mg prn.  Alternatives clonazepam, Ativan  Consider switch to sertraline.  Discussed potential metabolic side effects associated with atypical antipsychotics, as well as potential risk for movement side effects.  Advised pt to contact office if movement side effects occur.  Metabolic side effects are unlikely at this low dose of quetiapine and almost no risk of EPS at this low-dose. DM managed  We discussed the short-term risks associated with benzodiazepines including sedation and increased fall risk among others.  Discussed long-term side effect risk including dependence, potential withdrawal symptoms, and the potential eventual dose-related risk of dementia.  But recent studies from 2020 dispute this association between benzodiazepines and dementia risk. Newer studies in 2020 do not support an association with dementia.  Continue FMLA until 8.21.24  Follow-up 1 mos  Meredith Staggers MD, DFAPA  Please see After Visit Summary for patient specific instructions.   Future Appointments  Date Time Provider Department Center  01/04/2023  1:00 PM Sandford Craze, NP [facility]  01/11/2023  8:00 AM [facility] 6 [facility]  01/11/2023  9:00 AM [facility]  01/28/2023  3:30 PM Cottle, Steva Ready., MD [facility] None  02/25/2023  3:30 PM Cottle, Steva Ready., MD CP-CP None    No orders of the defined types were placed in this encounter.      -------------------------------

## 2022-12-31 NOTE — Telephone Encounter (Signed)
Bailey Greene, PT with [facility] called stating that she had been trying to reach the pt all week with no success. She requested a non-therapy d/c visit next week.  Reviewed pt's chart, returned call for clarification, two identifiers used. She stated that she hadn't seen the pt last week d/t a doctor's appt. She stated that she'd tried to call pt all week to schedule, but was unsuccessful.   When asked how many visits the pt had, she stated that she'd only seen her once on 7/19. There have been no other visits for PT and RN.

## 2023-01-04 ENCOUNTER — Ambulatory Visit: Payer: 59 | Admitting: Family

## 2023-01-11 ENCOUNTER — Ambulatory Visit (INDEPENDENT_AMBULATORY_CARE_PROVIDER_SITE_OTHER): Payer: 59 | Admitting: Physician Assistant

## 2023-01-11 ENCOUNTER — Ambulatory Visit (HOSPITAL_COMMUNITY)
Admission: RE | Admit: 2023-01-11 | Discharge: 2023-01-11 | Disposition: A | Discharge status: Home / Self Care | Payer: 59 | Source: Ambulatory Visit | Attending: Vascular Surgery | Admitting: Vascular Surgery

## 2023-01-11 VITALS — BP 109/73 | HR 73 | Temp 97.9°F | Resp 18 | Ht 64.0 in | Wt 126.3 lb

## 2023-01-11 DIAGNOSIS — I70213 Atherosclerosis of native arteries of extremities with intermittent claudication, bilateral legs: Secondary | ICD-10-CM | POA: Diagnosis present

## 2023-01-11 DIAGNOSIS — I739 Peripheral vascular disease, unspecified: Secondary | ICD-10-CM | POA: Insufficient documentation

## 2023-01-11 LAB — VAS US ABI WITH/WO TBI
Left ABI: 0.95
Right ABI: 0.65

## 2023-01-11 NOTE — Progress Notes (Signed)
POST OPERATIVE OFFICE NOTE    CC:  F/u for surgery  HPI:  This is a 62 y.o. 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  This was performed by Dr. [name] on 12/08/2022.  Patient states claudication symptoms slowly resolved.  She believes her right groin incision is healed.  She is taking her aspirin, Plavix, statin daily.  She denies tobacco use.  Allergies  Allergen Reactions   Metformin And Related Nausea And Vomiting    Current Outpatient Medications  Medication Sig Dispense Refill   ACCU-CHEK GUIDE test strip USE UP TO FOUR TIMES DAILY AS DIRECTED 400 strip 6   Accu-Chek Softclix Lancets lancets USE UP TO FOUR TIMES DAILY AS DIRECTED 100 each 12   ALPRAZolam (XANAX) 0.5 MG tablet Take 1 tablet (0.5 mg total) by mouth 3 (three) times daily as needed for anxiety. 90 tablet 0   aspirin 81 MG EC tablet Take 1 tablet (81 mg total) by mouth daily. Swallow whole. 30 tablet 12   atorvastatin (LIPITOR) 80 MG tablet TAKE 1 TABLET BY MOUTH DAILY 90 tablet 3   BD PEN NEEDLE NANO U/F 32G X 4 MM MISC USE AS DIRECTED 300 each 1   blood glucose meter kit and supplies KIT Dispense based on patient and insurance preference. Use up to four times daily as directed. (FOR ICD-9 250.00, 250.01). 1 each 0   busPIRone (BUSPAR) 30 MG tablet Take 30 mg by mouth daily.     Cholecalciferol (VITAMIN D3) 75 MCG (3000 UT) TABS Take 1 tablet by mouth daily at 6 (six) AM. 30 tablet    cilostazol (PLETAL) 100 MG tablet Take 1 tablet (100 mg total) by mouth 2 (two) times daily before a meal. 60 tablet 11   cloNIDine (CATAPRES) 0.1 MG tablet 1 in the AM and 3 PM and 1 extra daily as needed for anxiety 270 tablet 0   clopidogrel (PLAVIX) 75 MG tablet TAKE 1 TABLET BY MOUTH EVERY DAY WITH BREAKFAST 90 tablet 3   Continuous Glucose Receiver  (DEXCOM G6 RECEIVER) DEVI Use as directed 1 each 0   Continuous Glucose Sensor (DEXCOM G6 SENSOR) MISC USE AS DIRECTED 3 each 1   Continuous Glucose Transmitter (DEXCOM G6 TRANSMITTER) MISC USE AS DIRECTED 1 each 1   FARXIGA 10 MG TABS tablet TAKE 1 TABLET BY MOUTH DAILY BEFORE BREAKFAST 30 tablet 11   insulin degludec (TRESIBA FLEXTOUCH) 100 UNIT/ML FlexTouch Pen Inject 12 Units into the skin daily. 3 mL 5   isosorbide mononitrate (IMDUR) 120 MG 24 hr tablet TAKE 1 TABLET BY MOUTH EVERY DAY 90 tablet 3   JANUVIA 50 MG tablet TAKE 1 TABLET BY MOUTH EVERY DAY 90 tablet 1   lisinopril (ZESTRIL) 5 MG tablet TAKE 1 TABLET (5 MG TOTAL) BY MOUTH DAILY. 90 tablet 1   metoprolol succinate (TOPROL-XL) 25 MG 24 hr tablet TAKE 1 TABLET BY MOUTH EVERY DAY *KEEP APPOINTMENT FOR REFILLS* 90 tablet 3   mupirocin ointment (BACTROBAN) 2 % Apply 1 application. topically 2 (two) times daily. 30 g 2   nitroGLYCERIN (NITROSTAT) 0.4 MG SL tablet PLACE 1 TABLET UNDER THE TONGUE EVERY 5 MINUTES AS NEEDED FOR CHEST PAIN. MAX 3. CALL 911 25 tablet 3   OLANZapine (ZYPREXA) 20 MG tablet TAKE 1 TABLET BY MOUTH EVERYDAY AT BEDTIME 30 tablet 0   oxyCODONE-acetaminophen (PERCOCET/ROXICET)  5-325 MG tablet Take 1-2 tablets by mouth every 4 (four) hours as needed for moderate pain. 30 tablet 0   PARoxetine (PAXIL) 40 MG tablet TAKE 1 TABLET BY MOUTH TWICE A DAY 60 tablet 0   ranolazine (RANEXA) 500 MG 12 hr tablet TAKE 1 TABLET BY MOUTH TWICE A DAY 180 tablet 3   XIIDRA 5 % SOLN Place 1 drop into both eyes in the morning and at bedtime.     No current facility-administered medications for this visit.     ROS:  See HPI  Physical Exam:  Vitals:   01/11/23 0839  BP: 109/73  Pulse: 73  Resp: 18  Temp: 97.9 F (36.6 C)  TempSrc: Temporal  SpO2: 95%  Weight: 126 lb 4.8 oz (57.3 kg)  Height: 5\' 4"  (1.626 m)    Incision: Right groin incision well-healed Extremities: Absent pedal pulses right foot; palpable left  DP Neuro: A&O  ABI/TBIToday's ABIToday's TBIPrevious ABIPrevious TBI  +-------+-----------+-----------+------------+------------+  Right 0.65       0.33       0.46        0.10          +-------+-----------+-----------+------------+------------+  Left  0.95       0.55       0.58        0              Assessment/Plan:  This is a 62 y.o. female who is s/p: Bilateral common iliac artery stenting as well as right common femoral endarterectomy and bovine patch angioplasty  -Claudication symptoms have completely resolved since surgery.  She has a palpable left DP pulse.  She has a known right SFA occlusion based on CTA from May of this year.  She however has a brisk DP and PT Doppler signal.  Right groin incision has completely healed.  She will continue her aspirin, Plavix, statin daily.  Encouraged smoking cessation.  We will check an aortoiliac duplex and ABIs in 3 months.   Emilie Rutter, PA-C [facility] [phone]  Clinic MD:  [facility]

## 2023-01-13 ENCOUNTER — Telehealth: Payer: Self-pay | Admitting: Cardiology

## 2023-01-13 NOTE — Telephone Encounter (Signed)
Left voicemail to return call to office.

## 2023-01-13 NOTE — Telephone Encounter (Signed)
Tomi Spencer a nurse with [facility] is calling because she has been trying to get in contact with the patient. Tomi requested we give the patient her contact information when she comes to her next appointment. I did inform Tomi the first appointment that we have scheduled is at the end of November and it will be for vascular testing not a consult. Tomi stated that was okay and still wanted [place] to give the patient her contact information.

## 2023-01-14 ENCOUNTER — Other Ambulatory Visit: Payer: Self-pay

## 2023-01-14 DIAGNOSIS — I739 Peripheral vascular disease, unspecified: Secondary | ICD-10-CM

## 2023-01-14 DIAGNOSIS — I70213 Atherosclerosis of native arteries of extremities with intermittent claudication, bilateral legs: Secondary | ICD-10-CM

## 2023-01-14 NOTE — Telephone Encounter (Signed)
LM to contact her [employer] contact Earley Favor and to call their number.

## 2023-01-18 ENCOUNTER — Ambulatory Visit: Payer: 59 | Admitting: Family

## 2023-01-28 ENCOUNTER — Encounter: Payer: Self-pay | Admitting: Psychiatry

## 2023-01-28 ENCOUNTER — Ambulatory Visit (INDEPENDENT_AMBULATORY_CARE_PROVIDER_SITE_OTHER): Payer: 59 | Admitting: Psychiatry

## 2023-01-28 DIAGNOSIS — F5105 Insomnia due to other mental disorder: Secondary | ICD-10-CM | POA: Diagnosis not present

## 2023-01-28 DIAGNOSIS — F3162 Bipolar disorder, current episode mixed, moderate: Secondary | ICD-10-CM | POA: Diagnosis not present

## 2023-01-28 DIAGNOSIS — F4001 Agoraphobia with panic disorder: Secondary | ICD-10-CM

## 2023-01-28 DIAGNOSIS — F411 Generalized anxiety disorder: Secondary | ICD-10-CM | POA: Diagnosis not present

## 2023-01-28 MED ORDER — CLONIDINE HCL 0.1 MG PO TABS
0.1000 mg | ORAL_TABLET | Freq: Three times a day (TID) | ORAL | 0 refills | Status: DC
Start: 2023-01-28 — End: 2023-05-21

## 2023-01-28 NOTE — Progress Notes (Signed)
Bailey Greene [id number] December 19, 1960 62 y.o.   Subjective:   Patient ID:  Bailey Greene is a 62 y.o. (DOB 1961-05-11) female.  Chief Complaint:  Chief Complaint  Patient presents with   Follow-up   Anxiety   Depression   Sleeping Problem    Anxiety Symptoms include nervous/anxious behavior. Patient reports no decreased concentration, dizziness, nausea or palpitations.     Bailey Greene presents for follow-up of bipolar disorder and panic attacks and general anxiety.  visit 5/22,2020.  Increased Depakote then to 1500 mg daily.  Boss rec LOA for 4 weeks.  Going through separation is really tough.  Panic attacks at work interfering.  Had this job 10 years.  Likes the job but hard to concentrate and stay focused.  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  Panic incr pulse and SOB, fear, sweating and shakey.  Panic lasts 20 mins and increased frequency.  Several in a day.  Going on for 2 mos but getting worse.  Boss can tell from listening to her calls and drop in production.  At follow up visit November 22, 2018.  Mood swings are better.  Trouble staying asleep.  Caffeine 1 coffee and 1 soda daily.She was still having severe anxiety plus panic.  We discussed the risk of SSRIs trickling triggering mood swings but the severity of the anxiety was such that we decided to initiate fluoxetine 10 mg daily to increase to 20 mg daily.  We also were using low-dose mirtazapine to help with sleep.  seen January 18, 2019.  She was granted medical leave for panic symptoms.  She was switched to paroxetine for panic from fluoxetine.  Since she was switched from mirtazapine to quetiapine 25 to 50 mg nightly for insomnia.   November 2020 visit with the following noted: It is helping some with anxiety but a lot of stress.  No unusual mood swings without a change.   She's satisfied with the 20 mg paroxetine. Sleep is much better with quetiapine and xanax at night.  5 hour and sometimes better  depending on work schedule.   No meds were changed.    10/23/2019 visit with the following noted: Anxiety, dep, panic attacks all worse lately for 2 mos.  Anger also worse mostly just at home bc manages it at work.  Several triggers.  Sister passed March 26 after illness.  Work and daughter are stressful.  Overwhelming. Not as good staying asleep.  Random panic.  Feels SOB.  Wanting to isolate. Spontaneous crying spell.s Poor concentration affecting work.    Plan: Increase paroxetine to 1-1/2 of the 20 mg tablets daily For sleep increase quetiapine to 2 the 25 mg tablets nightly  03/05/20 appt with following noted: Less anxious and sleeping is better.  Panic can be triggered at work with SOB and heart racing.  Happens about 2 times weekly. Main stress is work is overwhelming.  Talk with customers all day.   No clear mania.   Still some depression too. No SE Plan: no med changes except increase paroxetine to 40 for anxiety  09/02/2020 appt noted: Dx DM since here. Anxiety and panic worse since January.  Consistent with meds.  Stress dx DM.  Sister passed away.  Panic with SOB and occ sweats.  Panic daily. Usually before noon usually with trigger. Xanax makes her relax. 1 cup coffee AM.   Sleep 4-5 hours with quetiapine 25 mg.   And pretty consistent. Mood feels labile.  About to lose it including irritable  and angry.   Plan: Increase Quetiapine 25 mg 2 mg HS.   12/10/20 appt noted: Dizziniess for awhile after paroxetine resolved. Still having anxiety and panic but not daily.   Average 7/10 anxiety usually triggered with work as primary stress.  She and H still dealing with problems.  Panic worse either in the AM or evening. Sleep is better 4-5 hours nightly. Taking quetiapine 50 mg with Xanax at night. Still on Depakote ER 1500, busipirone 30 BID , paroxetine 40 mg daily No anger problems at work.  Appetite is better.  Regaining some weight. Evening walks helps mental health. Plan: Start  olanzapine 5 mg daily and if that is not sufficient within a week increase to 10 mg nightly.  We can go higher if needed and call if necessary. BC failure of alternatives, alprazolam 0.5 mg AM before work.   02/18/2021 appointment with the following noted: Anx down to 4-5/10.  Notices calmer with Xanax and throughout the day.Less anxiety and panic at work but getting better. Depression about the same 6/10.  Tries to distract herself from problems at home.. Sleep 6 hours now with olanzapine.   No SE Plan: increase olanzapine 15 mg HS. BC failure of alternatives, alprazolam 0.5 mg AM before work.   paroxetine to 40 mg at night.  04/28/2021 appointment with the following noted: Increase olanzapine 15 mg HS helped awhile with initial dizziness resolved.  Not drowsy. Still better than it was but holidays are hard.  Better than in a long time overall.  H saw a difference also.   Sleep 5-6 hours.  Never been a 7 hour sleeper.   Eating is normal now. Depression still there but better also 3/10.  Can enjoy some things. Better interest. Most stressful thing about work is the work load and talking with customers who  are difficult.  Had this job 13 years. Plan: Increase olanzapine 20 mg 3 hours before HS.  06/10/2021 phone call complaining of persistent insomnia.  She was allowed to increase alprazolam to 0.5 mg every morning and 1 mg nightly  06/30/2021 appointment with the following noted: Taking alprazolam 0.5 mg 2 daily usually. Increased olanzapine to 20 mg daily. CABG 2006.  Valve problem with CP and hospitalized.  Change in med. Going better now. No problems with meds.   About 6 hour sleep and in bed earlier.  Depression is OK overall but residual anxiety and crying.  Anxiety around health now too and work. Panic is not gone but better on Paroxetine 20 Sometimes brief irritability situationally. Plan: No med changes.  Continue olanzapine 20 mg, paroxetine 40 mg, alprazolam 0.5 mg twice  daily as needed  11/18/2021 appointment with the following noted: At one point since here anxiety got a lot worse but getting better now.  Some panic attacks including Saturday.   Compliant.  More work stress trying to get caught up.   Only taking alprazolam 0.5 mg HS and not in daytime.  Not sleepy with it daytime. No SE No depression or anxiety.   Plan: no med changes  04/08/22 TC:  increased anxiety and panic wanting med changes, so increase paroxetine to 60 mg daily  04/14/22 appt noted: Current meds alprazolam 0.5 mg 3 times daily as needed anxiety, buspirone 30 mg twice daily, Depakote ER 1500 mg nightly,, Olanzapine 20 mg nightly, quetiapine 25 mg nightly for sleep, paroxetine 60 mg as of 04/08/22 Anxiety and panic worse for a month and missed some work and then taken out  of work by PCP 03/30/22. Sx including panic with SOB. At least 2-3 times per day.  Worrying about everything she has to do including doctor's appts needs eye surgery.  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 Having to talk with customers all day triggers panic and can't talk during panic attacks. Even outside of work trouble getting things done Dt low energy and anxiety.  Everything closing in at one tgime and trouble breathing.  Feel overwhelmed all the time.   Also depression and has to push herself to go out of the house and low energy. Sleep is about 5-6 hours per night.   Wants to stay out of work until 12/22 to give time for doctor appts and get herself together. Current job 13 years. No med changes  05/17/22 appt noted: Anxiety is awful right now.  Hard to breath.  Heart races at times with panic.  Mind races.   2-3 panic attacks daily. SE maybe some sleepiness. Sleep about 4-5 hours.  Can't calm her mind down.   No recent counselors but has appt on 12/27 pending. Still out of work.  [place] asking about the TX plan  Plan: DC buspirone Off label clonidine and increase to 0.1 mg BID for severe  anxiety and panic   06/29/2022 appointment noted: Current psychiatric medication clonidine 0.2 mg daily, Depakote ER 1500 nightly, alprazolam 0.5 mg twice daily as needed anxiety, olanzapine 20 mg nightly, paroxetine 60 mg daily, quetiapine 25 mg nightly Clonidine is the first time that seen a change with anxiety.  It immediately helps.  Checking BP and all systolic pressures above 100. Better response with BID dosing.  Less SOB.  Less irritable.   SE dry mouth, no others. Sleep is better  but still only 4-5 hours.   Depression is gradually getting better but not there yet. Back at work and clonidine helps her deal with the pressure better there.  Handling it better.  Work function is pretty good with constant volume.  Before was SOB bc couldn't relax to breathe. 2 therapy appts. Plan: Consider further increase olanzapine if necessary Continue olanzapine 20 mg 3 hours before HS. Continue Depakote ER 1500 mg HS Off label clonidine 0.1 mg BID for severe anxiety and panic has helped reduce anxiety by over 50% She didn't each or drink much today and BP borderline wihtout  SX.  Push fluids. Increased paroxetine to 60 mg daily as of 04/08/22 for worsening panic. No better so far with increased dose. BC failure of alternatives, alprazolam 0.5 mg AM before work twice daily.   08/31/22 appt noted: Stopped Depakote last vist and only alprazolam 0.5 mg HS now. MedS: In a much better place .  Still anxious but not like it used to be.  Couldn't control it before.  Less SOB. Still some panic but not as easy.  Still get triggered at work or home that realizes she should not get as upset over.  Easily overwhelmed with high demands. Sleep is better about 5-6 hours. Was having NM but less now.  Was waking H with them but not now. No SE noted.   BP usally above 100/70.  Not lightheaded or dizzy with standing. Plan: no med changes  11/01/22 apt noted: Psych meds: Alprazolam 0.5 mg 3 times daily as needed,  clonidine 0.1 mg twice daily and 1 extra as needed anxiety, olanzapine 20 mg nightly, paroxetine 60 mg daily. I feel like I'm having a breakdown with anxiety.  Overwhelmed with everything with  multiple stressors. Worse about 2 weeks.  Additional stressors at work.  More mistakes at work.  Overwhelmed.  Some criticism from boss who notices the decline in function. Uncontrollable crying spells.  Tired.  More depressed and anxious.   ? Blood clot in leg.   Feeling more angry and poor conc with pressure at work.  Usually able to function at work but right now not good.  Feels like she will breakdown and lose it.  No SI but dep a lot.  Don't feel like doing as much as usual.  Feels SOB and having panic at work.   Not sleeping as much as she should but not insomnia. No SE.   Plan: OK FMLA .  Out of work until July 9 when has FU appt. Increase paroxetine to high dose 80 for TR anxiety and depression.    12/06/22 appt noted: Psych meds: as above.  Paroxetine 80 mg daily. Buspirone 30 mg daily. Alprazolam 0.5 mg 3 times daily as needed, clonidine 0.1 mg twice daily and 1 extra as needed anxiety, olanzapine 20 mg nightly Not much change in dep or anxiety.  Will have stent placed in artery in R leg on 7/10 for claudication.  Creating stress and anxiety from that also. Anxiety worse than dep.  Having panic attacks with SOB daily.  Created by worry over health and job and life ongoing.  Even though tries to relax anxiety takes over.    Sleep is not as good 4-5 hours with worry.   Consistent with meds and no SE  12/31/22 appt noted: Psych meds as above.  Has been on paroxetine 80 mg a day for 8 weeks.  Continues buspirone 30 mg daily, alprazolam 0.5 mg 3 times daily as needed, clonidine 0.1 mg twice daily, olanzapine 20 mg nightly. Consistent and no SE. It is getting better.  Less panic and anxiety.  Still some but not as severe. Panic is under control.  Anxiety is still an issue.  It is improving but fears  relapse and not confident. Sleep a little better 5-6 hours.   Stent placement in leg R for vascular px successful. Plans to RTW 01/19/23.   She's satisfied with meds.  01/28/23 appt noted: Psych meds as above.  Has been on paroxetine 80 mg a day since 11/01/22. Continues buspirone 30 mg daily, alprazolam 0.5 mg 3 times daily as needed, clonidine 0.1 mg twice daily, olanzapine 20 mg nightly. Consistent and no SE. 2 kids: 39, 30 daughters. I think it is getting better.  Working to stay calm at work.  Most stressful thing is volume of work.  Been there 13 years.   Has looked some for a different less stressful job.  Most anxiety is at work.   A little panicky ouside of work.  Gets overwhelmed with tasks outside of work too. Sleep unchanged 5-6 hours.  Occ NM but doesn't remember.  Will wake her from sleep.   Notices benefit of clonidine  PDMP only shows Xanax.  She denies abusing substances  Past Psychiatric Medication Trials: Hydroxyzine, mirtazapine poor response for sleep,   Depakote 1500,  quetiapine low-dose for sleep Olanzapine 20  duloxetine, citalopram, Wellbutrin, fluoxetine, paroxetine 80 Clonidine 0.1 BID Xanax,  Buspirone NR  Notes and chart were reviewed with the patient regarding prior history and symptoms.  Review of Systems:  Review of Systems  Constitutional:  Positive for fatigue.  Cardiovascular:  Positive for leg swelling. Negative for palpitations.  Gastrointestinal:  Negative for nausea.  Musculoskeletal:  Positive for myalgias.  Neurological:  Negative for dizziness, tremors and light-headedness.  Psychiatric/Behavioral:  Positive for dysphoric mood. Negative for decreased concentration and sleep disturbance. The patient is nervous/anxious.     Medications: I have reviewed the patient's current medications.  Current Outpatient Medications  Medication Sig Dispense Refill   ACCU-CHEK GUIDE test strip USE UP TO FOUR TIMES DAILY AS DIRECTED 400 strip 6    Accu-Chek Softclix Lancets lancets USE UP TO FOUR TIMES DAILY AS DIRECTED 100 each 12   ALPRAZolam (XANAX) 0.5 MG tablet Take 1 tablet (0.5 mg total) by mouth 3 (three) times daily as needed for anxiety. 90 tablet 0   aspirin 81 MG EC tablet Take 1 tablet (81 mg total) by mouth daily. Swallow whole. 30 tablet 12   atorvastatin (LIPITOR) 80 MG tablet TAKE 1 TABLET BY MOUTH DAILY 90 tablet 3   BD PEN NEEDLE NANO U/F 32G X 4 MM MISC USE AS DIRECTED 300 each 1   blood glucose meter kit and supplies KIT Dispense based on patient and insurance preference. Use up to four times daily as directed. (FOR ICD-9 250.00, 250.01). 1 each 0   busPIRone (BUSPAR) 30 MG tablet Take 30 mg by mouth daily.     Cholecalciferol (VITAMIN D3) 75 MCG (3000 UT) TABS Take 1 tablet by mouth daily at 6 (six) AM. 30 tablet    cilostazol (PLETAL) 100 MG tablet Take 1 tablet (100 mg total) by mouth 2 (two) times daily before a meal. 60 tablet 11   clopidogrel (PLAVIX) 75 MG tablet TAKE 1 TABLET BY MOUTH EVERY DAY WITH BREAKFAST 90 tablet 3   Continuous Glucose Receiver (DEXCOM G6 RECEIVER) DEVI Use as directed 1 each 0   Continuous Glucose Sensor (DEXCOM G6 SENSOR) MISC USE AS DIRECTED 3 each 1   Continuous Glucose Transmitter (DEXCOM G6 TRANSMITTER) MISC USE AS DIRECTED 1 each 1   FARXIGA 10 MG TABS tablet TAKE 1 TABLET BY MOUTH DAILY BEFORE BREAKFAST 30 tablet 11   insulin degludec (TRESIBA FLEXTOUCH) 100 UNIT/ML FlexTouch Pen Inject 12 Units into the skin daily. 3 mL 5   isosorbide mononitrate (IMDUR) 120 MG 24 hr tablet TAKE 1 TABLET BY MOUTH EVERY DAY 90 tablet 3   JANUVIA 50 MG tablet TAKE 1 TABLET BY MOUTH EVERY DAY 90 tablet 1   lisinopril (ZESTRIL) 5 MG tablet TAKE 1 TABLET (5 MG TOTAL) BY MOUTH DAILY. 90 tablet 1   metoprolol succinate (TOPROL-XL) 25 MG 24 hr tablet TAKE 1 TABLET BY MOUTH EVERY DAY *KEEP APPOINTMENT FOR REFILLS* 90 tablet 3   mupirocin ointment (BACTROBAN) 2 % Apply 1 application. topically 2 (two) times  daily. 30 g 2   nitroGLYCERIN (NITROSTAT) 0.4 MG SL tablet PLACE 1 TABLET UNDER THE TONGUE EVERY 5 MINUTES AS NEEDED FOR CHEST PAIN. MAX 3. CALL 911 25 tablet 3   OLANZapine (ZYPREXA) 20 MG tablet TAKE 1 TABLET BY MOUTH EVERYDAY AT BEDTIME 30 tablet 0   oxyCODONE-acetaminophen (PERCOCET/ROXICET) 5-325 MG tablet Take 1-2 tablets by mouth every 4 (four) hours as needed for moderate pain. 30 tablet 0   PARoxetine (PAXIL) 40 MG tablet TAKE 1 TABLET BY MOUTH TWICE A DAY 60 tablet 0   ranolazine (RANEXA) 500 MG 12 hr tablet TAKE 1 TABLET BY MOUTH TWICE A DAY 180 tablet 3   XIIDRA 5 % SOLN Place 1 drop into both eyes in the morning and at bedtime.     cloNIDine (CATAPRES) 0.1 MG tablet Take  1 tablet (0.1 mg total) by mouth 3 (three) times daily. 270 tablet 0   No current facility-administered medications for this visit.    Medication Side Effects: ? 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   Qualifier: Diagnosis of  By: Cheri Guppy     Diabetes mellitus type 2 in nonobese (HCC) 07/2016   Dyslipidemia    Headache(784.0)    History of pulmonary embolism 07/08/2020   HTN (hypertension)    Migraine    NSTEMI (non-ST elevated myocardial infarction) (HCC)    PAD (peripheral artery disease) (HCC)    Pulmonary embolus (HCC)    unprovoked    Family History  Problem Relation Age of Onset   Lupus Mother    Heart attack Father    Diabetes Father    Hypertension Father    Diabetes Paternal Grandmother    Hypertension Paternal Grandmother    Stroke Neg Hx    Kidney disease Neg Hx    Hyperlipidemia Neg Hx    Sudden death Neg Hx     Social History   Socioeconomic History   Marital status: Legally  Separated    Spouse name: Not on file   Number of children: Not on file   Years of education: Not on file   Highest education level: Some college, no degree  Occupational History   Occupation: Armed forces training and education officer  Tobacco Use   Smoking status: Former    Current packs/day: 0.00    Types: Cigarettes    Start date: 09/12/1996    Quit date: 09/12/2021    Years since quitting: 1.3   Smokeless tobacco: Never   Tobacco comments:    0.5 pack cigarettes  per month per patient   Vaping Use   Vaping status: Never Used  Substance and Sexual Activity   Alcohol use: No    Alcohol/week: 0.0 standard drinks of alcohol   Drug use: Never   Sexual activity: Yes    Partners: Male    Comment: married  Other Topics Concern   Not on file  Social History Narrative   Regular exercise:  3 days weeklyCaffeine    Use:  1 soda daily   One child biological daughter born in 73 and an adopted niece.[employer]- Engineer, technical sales   Married- may be divorcing.    Social Determinants of Health   Financial Resource Strain: Medium Risk (10/05/2022)   Overall Financial Resource Strain (CARDIA)    Difficulty of Paying Living Expenses: Somewhat hard  Food Insecurity: Food Insecurity Present (10/05/2022)   Hunger Vital Sign    Worried About Running Out of Food in the Last Year: Sometimes true    Ran Out of Food in the Last Year: Sometimes true  Transportation Needs: No Transportation Needs (10/05/2022)   PRAPARE - Administrator, Civil Service (Medical): No    Lack of Transportation (Non-Medical): No  Physical Activity: Insufficiently Active (10/05/2022)   Exercise Vital Sign    Days of Exercise per Week: 3 days    Minutes of Exercise per Session: 20 min  Stress: Stress Concern Present (10/05/2022)   [employer] of Occupational Health - Occupational Stress Questionnaire  Feeling of Stress : Rather much  Social Connections: Unknown (10/05/2022)   Social Connection and Isolation Panel  [NHANES]    Frequency of Communication with Friends and Family: Twice a week    Frequency of Social Gatherings with Friends and Family: Once a week    Attends Religious Services: Patient declined    Database administrator or Organizations: No    Attends Engineer, structural: Not on file    Marital Status: Married  Catering manager Violence: Not on file    Past Medical History, Surgical history, Social history, and Family history were reviewed and updated as appropriate.   Please see review of systems for further details on the patient's review from today.   Objective:   Physical Exam:  LMP 11/28/2009   Physical Exam Constitutional:      General: She is not in acute distress.    Appearance: She is well-developed.  Musculoskeletal:        General: No deformity.  Neurological:     Mental Status: She is alert and oriented to person, place, and time.     Coordination: Coordination normal.  Psychiatric:        Attention and Perception: She is attentive. She does not perceive auditory hallucinations.        Mood and Affect: Mood is anxious and depressed. Affect is not labile, blunt, angry or tearful.        Speech: Speech normal. Speech is not rapid and pressured.        Behavior: Behavior normal. Behavior is not agitated.        Thought Content: Thought content normal. Thought content is not paranoid or delusional. Thought content does not include homicidal or suicidal ideation.        Cognition and Memory: Cognition normal.        Judgment: Judgment normal.     Comments: Insight intact. No auditory or visual hallucinations. No delusions.  Improving anxiety , panic, depression. Not resolved.     Lab Review:     Component Value Date/Time   NA 138 12/09/2022 0330   NA 141 07/23/2020 1300   K 4.0 12/09/2022 0330   CL 110 12/09/2022 0330   CO2 22 12/09/2022 0330   GLUCOSE 129 (H) 12/09/2022 0330   BUN 16 12/09/2022 0330   BUN 7 07/23/2020 1300   CREATININE 1.15 (H)  12/09/2022 0330   CREATININE 0.72 06/22/2012 0840   CALCIUM 9.0 12/09/2022 0330   PROT 6.9 11/30/2022 1351   PROT 6.6 03/29/2022 1504   ALBUMIN 3.9 11/30/2022 1351   ALBUMIN 4.6 03/29/2022 1504   AST 17 11/30/2022 1351   ALT 18 11/30/2022 1351   ALKPHOS 95 11/30/2022 1351   BILITOT 0.7 11/30/2022 1351   BILITOT 0.4 03/29/2022 1504   GFRNONAA 55 (L) 12/09/2022 0330   GFRNONAA >89 06/22/2012 0840   GFRAA 102 07/23/2020 1300   GFRAA >89 06/22/2012 0840       Component Value Date/Time   WBC 10.7 (H) 12/09/2022 0330   RBC 2.97 (L) 12/09/2022 0330   HGB 8.9 (L) 12/09/2022 0330   HCT 25.9 (L) 12/09/2022 0330   PLT 148 (L) 12/09/2022 0330   MCV 87.2 12/09/2022 0330   MCH 30.0 12/09/2022 0330   MCHC 34.4 12/09/2022 0330   RDW 13.8 12/09/2022 0330   LYMPHSABS 2.2 02/25/2022 0950   MONOABS 0.4 02/25/2022 0950   EOSABS 0.3 02/25/2022 0950   BASOSABS 0.0 02/25/2022 0950    No results found  for: "POCLITH", "LITHIUM"   No results found for: "PHENYTOIN", "PHENOBARB", "VALPROATE", "CBMZ"   .res Assessment: Plan:    Bailey Greene was seen today for follow-up, anxiety, depression and sleeping problem.  Diagnoses and all orders for this visit:  Moderate mixed bipolar I disorder (HCC)  Panic disorder with agoraphobia -     cloNIDine (CATAPRES) 0.1 MG tablet; Take 1 tablet (0.1 mg total) by mouth 3 (three) times daily.  Generalized anxiety disorder -     cloNIDine (CATAPRES) 0.1 MG tablet; Take 1 tablet (0.1 mg total) by mouth 3 (three) times daily.  Insomnia due to mental condition    Chronic work stress  30 min face to face time with patient was spent on counseling and coordination of care. We discussed the following:  Repeated bouts of STD DT anxiety and depression. However she is more anxious and depressed with panic lately affecting work.  She is getting better and will return to work in about 3 weeks.  Paroxetine gradually helping. Also recent surgery  Continue  olanzapine 20 mg 3 hours before HS. Stoppped Depakote ER without difficulty.  Consider option of Abilify aug but might not sleep if olanazaine stopped.  Increase Off label clonidine 0.1 mg TID for severe anxiety and panic .  It has helped reduce anxiety by 50%  Push fluids.  Continue high dose for another month or so bc might need more time to get max bnefit.  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   Disc SE in detail and SSRI withdrawal sx.  BC failure of alternatives, alprazolam 0.5 mg prn.  Alternatives clonazepam, Ativan  Consider switch to sertraline.  Discussed potential metabolic side effects associated with atypical antipsychotics, as well as potential risk for movement side effects. Advised pt to contact office if movement side effects occur.  Metabolic side effects are unlikely at this low dose of quetiapine and almost no risk of EPS at this low-dose. DM managed  We discussed the short-term risks associated with benzodiazepines including sedation and increased fall risk among others.  Discussed long-term side effect risk including dependence, potential withdrawal symptoms, and the potential eventual dose-related risk of dementia.  But recent studies from 2020 dispute this association between benzodiazepines and dementia risk. Newer studies in 2020 do not support an association with dementia.  Supportive therapy around handling job stress which is major trigger  Has RTW  Follow-up 2 mos  Meredith Staggers MD, DFAPA  Please see After Visit Summary for patient specific instructions.   Future Appointments  Date Time Provider Department Center  02/01/2023  5:40 PM Sandford Craze, NP [facility]  02/25/2023  3:30 PM Cottle, Steva Ready., MD [facility] None  04/26/2023  8:30 AM [facility] 4 [facility]  04/26/2023  9:30 AM [facility]  04/26/2023  9:45 AM [facility]    No orders of the  defined types were placed in this encounter.      -------------------------------

## 2023-02-01 ENCOUNTER — Telehealth: Payer: Self-pay | Admitting: Family

## 2023-02-01 ENCOUNTER — Ambulatory Visit (INDEPENDENT_AMBULATORY_CARE_PROVIDER_SITE_OTHER): Payer: 59 | Admitting: Family

## 2023-02-01 VITALS — BP 92/62 | HR 73 | Temp 98.3°F | Resp 16 | Wt 130.0 lb

## 2023-02-01 DIAGNOSIS — E785 Hyperlipidemia, unspecified: Secondary | ICD-10-CM

## 2023-02-01 DIAGNOSIS — Z794 Long term (current) use of insulin: Secondary | ICD-10-CM

## 2023-02-01 DIAGNOSIS — E119 Type 2 diabetes mellitus without complications: Secondary | ICD-10-CM

## 2023-02-01 DIAGNOSIS — I739 Peripheral vascular disease, unspecified: Secondary | ICD-10-CM | POA: Diagnosis not present

## 2023-02-01 DIAGNOSIS — I10 Essential (primary) hypertension: Secondary | ICD-10-CM

## 2023-02-01 DIAGNOSIS — E118 Type 2 diabetes mellitus with unspecified complications: Secondary | ICD-10-CM | POA: Diagnosis not present

## 2023-02-01 DIAGNOSIS — I2510 Atherosclerotic heart disease of native coronary artery without angina pectoris: Secondary | ICD-10-CM

## 2023-02-01 MED ORDER — TRESIBA FLEXTOUCH 100 UNIT/ML ~~LOC~~ SOPN: 10.0000 [IU] | PEN_INJECTOR | Freq: Every day | SUBCUTANEOUS | Status: DC

## 2023-02-01 NOTE — Telephone Encounter (Signed)
Please call [facility] and request copy of Mammo and pap.

## 2023-02-01 NOTE — Progress Notes (Signed)
Subjective:     Patient ID: Bailey Greene, female    DOB: Jun 09, 1960, 62 y.o.   MRN: [id number]  Chief Complaint  Patient presents with   Diabetes    Here for follow up    HPI  Discussed the use of AI scribe software for clinical note transcription with the patient, who gave verbal consent to proceed.  History of Present Illness   The patient, with a history of diabetes, hypertension, hyperlipidemia, and anxiety, presents for a three month follow up after surgery. She reports frequent low blood glucose readings on her Dexcom meter, often in the 60s and 70s, particularly in the morning and late evening. She denies symptoms of hypoglycemia such as lightheadedness. She also notes occasional readings in the 120s postprandially.  The patient's mood is improving after a period of stress related to her recent surgery. She is working from home, which she finds beneficial for her anxiety. She has noticed a recent weight gain of four pounds, which she is pleased about as she was concerned about excessive weight loss.  The patient also reports taking clonidine twice daily for anxiety, prescribed by Dr. Jennelle Human. She has not experienced any dizziness, despite her blood pressure being on the low side. She is due to see Dr. Jennelle Human again at the end of September.       Lab Results  Component Value Date   HGBA1C 6.3 (H) 12/08/2022   HGBA1C 6.5 10/05/2022   HGBA1C 6.7 (H) 07/06/2022   Lab Results  Component Value Date   MICROALBUR <0.7 02/25/2022   LDLCALC 70 10/05/2022   CREATININE 1.15 (H) 12/09/2022   Wt Readings from Last 3 Encounters:  02/01/23 130 lb (59 kg)  01/11/23 126 lb 4.8 oz (57.3 kg)  12/20/22 126 lb (57.2 kg)         Health Maintenance Due  Topic Date Due   MAMMOGRAM  03/11/2022   INFLUENZA VACCINE  12/30/2022   Diabetic kidney evaluation - Urine ACR  02/26/2023   PAP SMEAR-Modifier  03/12/2023    Past Medical History:  Diagnosis Date   Anxiety    Bipolar  affective disorder (HCC)    CAD (coronary artery disease) 2006   CABG w/ LIMA-LAD, RIMA-Diag, SVG-OM1-OM2, R radial-PDA   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   Qualifier: Diagnosis of  By: Cheri Guppy     Diabetes mellitus type 2 in nonobese (HCC) 07/2016   Dyslipidemia    Headache(784.0)    History of pulmonary embolism 07/08/2020   HTN (hypertension)    Migraine    NSTEMI (non-ST elevated myocardial infarction) (HCC)    PAD (peripheral artery disease) (HCC)    Pulmonary embolus (HCC)    unprovoked    Past Surgical History:  Procedure Laterality Date   AORTOGRAM N/A 12/08/2022   Procedure: AORTOGRAM;  Surgeon: Leonie Douglas, MD;  Location: [facility] OR;  Service: Vascular;  Laterality: N/A;   BUNIONECTOMY  08/2011   right foot   CARDIAC CATHETERIZATION  2007   severe native 3 v dz, all grafts patent (LIMA-LAD, RIMA-Diag, SVG-OM1-OM2, R radial-PDA)   CESAREAN SECTION  1984   CORONARY ARTERY BYPASS GRAFT  2006    Coronary artery bypass grafting x5 with a left  internal  mammary to the left anterior descending coronary artery.  Free right  internal mammary to the diagonal coronary artery, sequential reverse  saphenous vein graft to the first and second obtuse marginal, right  artery bypass to the posterior descending coronary artery with endo-vein harvesting.  ENDARTERECTOMY FEMORAL Right 12/08/2022   Procedure: RIGHT COMMON FEMORAL ENDARTERECTOMY;  Surgeon: Leonie Douglas, MD;  Location: [facility] OR;  Service: Vascular;  Laterality: Right;   INSERTION OF ILIAC STENT Bilateral 12/08/2022   Procedure: INSERTION OF BILATERAL ILIAC KISSING STENTS USING VIABAHN VBX STENTS;  Surgeon: Leonie Douglas, MD;  Location: [facility] OR;  Service: Vascular;  Laterality: Bilateral;   LEFT HEART CATH AND CORS/GRAFTS ANGIOGRAPHY N/A 05/05/2021   Procedure: LEFT HEART CATH AND CORS/GRAFTS ANGIOGRAPHY;  Surgeon: Marykay Lex, MD;  Location: [facility] INVASIVE CV LAB;  Service: Cardiovascular;  Laterality: N/A;   ULTRASOUND  GUIDANCE FOR VASCULAR ACCESS Left 12/08/2022   Procedure: ULTRASOUND GUIDANCE FOR VASCULAR ACCESS OF LEFT FEMORAL ARTERY;  Surgeon: Leonie Douglas, MD;  Location: [facility] OR;  Service: Vascular;  Laterality: Left;   UMBILICAL HERNIA REPAIR N/A 01/16/2021   Procedure: UMBILICAL  HERNIA REPAIR;  Surgeon: Fritzi Mandes, MD;  Location: [facility] OR;  Service: General;  Laterality: N/A;    Family History  Problem Relation Age of Onset   Lupus Mother    Heart attack Father    Diabetes Father    Hypertension Father    Diabetes Paternal Grandmother    Hypertension Paternal Grandmother    Stroke Neg Hx    Kidney disease Neg Hx    Hyperlipidemia Neg Hx    Sudden death Neg Hx     Social History   Socioeconomic History   Marital status: Legally Separated    Spouse name: Not on file   Number of children: Not on file   Years of education: Not on file   Highest education level: Some college, no degree  Occupational History   Occupation: Armed forces training and education officer  Tobacco Use   Smoking status: Former    Current packs/day: 0.00    Types: Cigarettes    Start date: 09/12/1996    Quit date: 09/12/2021    Years since quitting: 1.3   Smokeless tobacco: Never   Tobacco comments:    0.5 pack cigarettes  per month per patient   Vaping Use   Vaping status: Never Used  Substance and Sexual Activity   Alcohol use: No    Alcohol/week: 0.0 standard drinks of alcohol   Drug use: Never   Sexual activity: Yes    Partners: Male    Comment: married  Other Topics Concern   Not on file  Social History Narrative   Regular exercise:  3 days weeklyCaffeine    Use:  1 soda daily   One child biological daughter born in 38 and an adopted niece.[employer]- Engineer, technical sales   Married- may be divorcing.    Social Determinants of Health   Financial Resource Strain: Medium Risk (10/05/2022)   Overall Financial Resource Strain (CARDIA)    Difficulty of Paying Living Expenses: Somewhat hard  Food Insecurity:  Food Insecurity Present (10/05/2022)   Hunger Vital Sign    Worried About Running Out of Food in the Last Year: Sometimes true    Ran Out of Food in the Last Year: Sometimes true  Transportation Needs: No Transportation Needs (10/05/2022)   PRAPARE - Administrator, Civil Service (Medical): No    Lack of Transportation (Non-Medical): No  Physical Activity: Insufficiently Active (10/05/2022)   Exercise Vital Sign    Days of Exercise per Week: 3 days    Minutes of Exercise per Session: 20 min  Stress: Stress Concern Present (10/05/2022)   [employer] of Occupational Health -  Occupational Stress Questionnaire    Feeling of Stress : Rather much  Social Connections: Unknown (10/05/2022)   Social Connection and Isolation Panel [NHANES]    Frequency of Communication with Friends and Family: Twice a week    Frequency of Social Gatherings with Friends and Family: Once a week    Attends Religious Services: Patient declined    Database administrator or Organizations: No    Attends Engineer, structural: Not on file    Marital Status: Married  Catering manager Violence: Not on file    Outpatient Medications Prior to Visit  Medication Sig Dispense Refill   ACCU-CHEK GUIDE test strip USE UP TO FOUR TIMES DAILY AS DIRECTED 400 strip 6   Accu-Chek Softclix Lancets lancets USE UP TO FOUR TIMES DAILY AS DIRECTED 100 each 12   ALPRAZolam (XANAX) 0.5 MG tablet Take 1 tablet (0.5 mg total) by mouth 3 (three) times daily as needed for anxiety. 90 tablet 0   aspirin 81 MG EC tablet Take 1 tablet (81 mg total) by mouth daily. Swallow whole. 30 tablet 12   atorvastatin (LIPITOR) 80 MG tablet TAKE 1 TABLET BY MOUTH DAILY 90 tablet 3   BD PEN NEEDLE NANO U/F 32G X 4 MM MISC USE AS DIRECTED 300 each 1   blood glucose meter kit and supplies KIT Dispense based on patient and insurance preference. Use up to four times daily as directed. (FOR ICD-9 250.00, 250.01). 1 each 0   busPIRone (BUSPAR) 30  MG tablet Take 30 mg by mouth daily.     Cholecalciferol (VITAMIN D3) 75 MCG (3000 UT) TABS Take 1 tablet by mouth daily at 6 (six) AM. 30 tablet    cilostazol (PLETAL) 100 MG tablet Take 1 tablet (100 mg total) by mouth 2 (two) times daily before a meal. 60 tablet 11   cloNIDine (CATAPRES) 0.1 MG tablet Take 1 tablet (0.1 mg total) by mouth 3 (three) times daily. 270 tablet 0   clopidogrel (PLAVIX) 75 MG tablet TAKE 1 TABLET BY MOUTH EVERY DAY WITH BREAKFAST 90 tablet 3   Continuous Glucose Receiver (DEXCOM G6 RECEIVER) DEVI Use as directed 1 each 0   Continuous Glucose Sensor (DEXCOM G6 SENSOR) MISC USE AS DIRECTED 3 each 1   Continuous Glucose Transmitter (DEXCOM G6 TRANSMITTER) MISC USE AS DIRECTED 1 each 1   FARXIGA 10 MG TABS tablet TAKE 1 TABLET BY MOUTH DAILY BEFORE BREAKFAST 30 tablet 11   isosorbide mononitrate (IMDUR) 120 MG 24 hr tablet TAKE 1 TABLET BY MOUTH EVERY DAY 90 tablet 3   JANUVIA 50 MG tablet TAKE 1 TABLET BY MOUTH EVERY DAY 90 tablet 1   lisinopril (ZESTRIL) 5 MG tablet TAKE 1 TABLET (5 MG TOTAL) BY MOUTH DAILY. 90 tablet 1   metoprolol succinate (TOPROL-XL) 25 MG 24 hr tablet TAKE 1 TABLET BY MOUTH EVERY DAY *KEEP APPOINTMENT FOR REFILLS* 90 tablet 3   mupirocin ointment (BACTROBAN) 2 % Apply 1 application. topically 2 (two) times daily. 30 g 2   nitroGLYCERIN (NITROSTAT) 0.4 MG SL tablet PLACE 1 TABLET UNDER THE TONGUE EVERY 5 MINUTES AS NEEDED FOR CHEST PAIN. MAX 3. CALL 911 25 tablet 3   OLANZapine (ZYPREXA) 20 MG tablet TAKE 1 TABLET BY MOUTH EVERYDAY AT BEDTIME 30 tablet 0   oxyCODONE-acetaminophen (PERCOCET/ROXICET) 5-325 MG tablet Take 1-2 tablets by mouth every 4 (four) hours as needed for moderate pain. 30 tablet 0   PARoxetine (PAXIL) 40 MG tablet TAKE 1 TABLET BY  MOUTH TWICE A DAY 60 tablet 0   ranolazine (RANEXA) 500 MG 12 hr tablet TAKE 1 TABLET BY MOUTH TWICE A DAY 180 tablet 3   XIIDRA 5 % SOLN Place 1 drop into both eyes in the morning and at bedtime.      insulin degludec (TRESIBA FLEXTOUCH) 100 UNIT/ML FlexTouch Pen Inject 12 Units into the skin daily. 3 mL 5   No facility-administered medications prior to visit.    Allergies  Allergen Reactions   Metformin And Related Nausea And Vomiting    ROS See HPI    Objective:    Physical Exam Constitutional:      General: She is not in acute distress.    Appearance: Normal appearance. She is well-developed.  HENT:     Head: Normocephalic and atraumatic.     Right Ear: External ear normal.     Left Ear: External ear normal.  Eyes:     General: No scleral icterus. Neck:     Thyroid: No thyromegaly.  Cardiovascular:     Rate and Rhythm: Normal rate and regular rhythm.     Heart sounds: Normal heart sounds. No murmur heard. Pulmonary:     Effort: Pulmonary effort is normal. No respiratory distress.     Breath sounds: Normal breath sounds. No wheezing.  Musculoskeletal:     Cervical back: Neck supple.  Skin:    General: Skin is warm and dry.  Neurological:     Mental Status: She is alert and oriented to person, place, and time.  Psychiatric:        Mood and Affect: Mood normal.        Behavior: Behavior normal.        Thought Content: Thought content normal.        Judgment: Judgment normal.      BP 92/62 (BP Location: Right Arm, Patient Position: Sitting, Cuff Size: Small)   Pulse 73   Temp 98.3 F (36.8 C) (Oral)   Resp 16   Wt 130 lb (59 kg)   LMP 11/28/2009   SpO2 100%   BMI 22.31 kg/m  Wt Readings from Last 3 Encounters:  02/01/23 130 lb (59 kg)  01/11/23 126 lb 4.8 oz (57.3 kg)  12/20/22 126 lb (57.2 kg)       Assessment & Plan:   Problem List Items Addressed This Visit       Unprioritized   PAD (peripheral artery disease) (HCC)     -Claudication symptoms have completely resolved since surgery.  Had recent follow up with vascular.       Hyperlipidemia with target LDL less than 70    Lab Results  Component Value Date   CHOL 130 10/05/2022   HDL  47.70 10/05/2022   LDLCALC 70 10/05/2022   LDLDIRECT 145.2 07/18/2008   TRIG 64.0 10/05/2022   CHOLHDL 3 10/05/2022  LDL at goal, Continue lipitor 80mg .       Essential hypertension     Blood pressure on the lower side, possibly due to multiple antihypertensive medications (Lisinopril, Metoprolol, Clonidine). -Communicate with Dr. Jennelle Human regarding the possibility of adjusting Clonidine dose due to low blood pressure. - she is on a beta blocker per cardiology due to CAD -  BP Readings from Last 3 Encounters:  02/01/23 92/62  01/11/23 109/73  12/20/22 106/66         Controlled diabetes mellitus type 2 with complications (HCC) - Primary     Improved glycemic control with A1c of 6.3, down  from 6.5 and 6.7. However, frequent hypoglycemic episodes with blood glucose levels dropping to 60-80. -Reduce Tresiba insulin from 12 units to 10 units daily. -Continue farxiga. -Check blood glucose levels regularly and report if levels continue to drop below 80.      Relevant Medications   insulin degludec (TRESIBA FLEXTOUCH) 100 UNIT/ML FlexTouch Pen   CAD (coronary artery disease)    Clinically stable, continues to follow with cardiology.      General Health Maintenance -Check kidney function and urinalysis today. -Request copies of recent mammogram and Pap smear from [facility]. -Plan to recheck A1c in three months. -Declined flu shot.  I have changed Marissa Calamity Evaristo Bury FlexTouch. I am also having her maintain her Xiidra, blood glucose meter kit and supplies, aspirin EC, mupirocin ointment, Accu-Chek Softclix Lancets, metoprolol succinate, cilostazol, atorvastatin, BD Pen Needle Nano U/F, Farxiga, Vitamin D3, isosorbide mononitrate, Accu-Chek Guide, Dexcom G6 Transmitter, Dexcom G6 Sensor, Dexcom G6 Receiver, ranolazine, busPIRone, clopidogrel, lisinopril, nitroGLYCERIN, oxyCODONE-acetaminophen, PARoxetine, Januvia, OLANZapine, ALPRAZolam, and cloNIDine.  Meds ordered this  encounter  Medications   insulin degludec (TRESIBA FLEXTOUCH) 100 UNIT/ML FlexTouch Pen    Sig: Inject 10 Units into the skin daily.    Order Specific Question:   Supervising Provider    Answer:   Danise Edge A [[id number]]

## 2023-02-02 LAB — BASIC METABOLIC PANEL
BUN: 29 mg/dL — ABNORMAL HIGH (ref 6–23)
CO2: 26 meq/L (ref 19–32)
Calcium: 9.4 mg/dL (ref 8.4–10.5)
Chloride: 105 meq/L (ref 96–112)
Creatinine, Ser: 1.28 mg/dL — ABNORMAL HIGH (ref 0.40–1.20)
GFR: 45.64 mL/min — ABNORMAL LOW (ref >60.00)
Glucose, Bld: 99 mg/dL (ref 70–99)
Potassium: 4.5 meq/L (ref 3.5–5.1)
Sodium: 137 meq/L (ref 135–145)

## 2023-02-02 LAB — MICROALBUMIN / CREATININE URINE RATIO
Creatinine,U: 30.3 mg/dL
Microalb Creat Ratio: 2.3 mg/g (ref 0.0–30.0)
Microalb, Ur: <0.7 mg/dL (ref 0.0–1.9)

## 2023-02-02 NOTE — Assessment & Plan Note (Signed)
>>  ASSESSMENT AND PLAN FOR ESSENTIAL HYPERTENSION WRITTEN ON 02/02/2023  8:51 AM BY O'SULLIVAN, Nollie Terlizzi, NP   Blood pressure on the lower side, possibly due to multiple antihypertensive medications (Lisinopril , Metoprolol , Clonidine ). -Communicate with Dr. Geoffry regarding the possibility of adjusting Clonidine  dose due to low blood pressure. - she is on a beta blocker per cardiology due to CAD -  BP Readings from Last 3 Encounters:  02/01/23 92/62  01/11/23 109/73  12/20/22 106/66

## 2023-02-02 NOTE — Assessment & Plan Note (Signed)
>>  ASSESSMENT AND PLAN FOR CAD (CORONARY ARTERY DISEASE) WRITTEN ON 02/02/2023  8:55 AM BY O'SULLIVAN, Lizvette Lightsey, NP  Clinically stable, continues to follow with cardiology.

## 2023-02-02 NOTE — Telephone Encounter (Signed)
Records requested electronically 

## 2023-02-02 NOTE — Assessment & Plan Note (Signed)
Lab Results  Component Value Date   CHOL 130 10/05/2022   HDL 47.70 10/05/2022   LDLCALC 70 10/05/2022   LDLDIRECT 145.2 07/18/2008   TRIG 64.0 10/05/2022   CHOLHDL 3 10/05/2022  LDL at goal, Continue lipitor 80mg .

## 2023-02-02 NOTE — Assessment & Plan Note (Signed)
-  Claudication symptoms have completely resolved since surgery.  Had recent follow up with vascular.

## 2023-02-02 NOTE — Patient Instructions (Signed)
VISIT SUMMARY:  During your recent visit, we discussed your diabetes, hypertension, anxiety, and general health. You reported frequent low blood glucose readings, particularly in the morning and late evening. Your mood is improving and you are working from home, which is beneficial for your anxiety. You have gained some weight, which is a positive development. You are taking clonidine for anxiety and have not experienced any dizziness, despite your blood pressure being on the low side.  YOUR PLAN:  -TYPE 2 DIABETES MELLITUS: Your blood sugar control has improved, but you are experiencing frequent low blood sugar episodes. We will reduce your Tresiba insulin from 12 units to 10 units daily. Please continue to check your blood sugar levels regularly and let us know if they continue to drop below 80. Diabetes Mellitus is a condition where your body does not use insulin properly, leading to high blood sugar levels.  -HYPERTENSION: Your blood pressure is on the lower side, possibly due to the multiple blood pressure medications you are taking. We will communicate with Dr. Jennelle Human about possibly adjusting your Clonidine dose. Hypertension is a condition where your blood pressure is consistently too high, which can lead to other health problems.  -GENERAL HEALTH MAINTENANCE: We will check your kidney function and urinalysis today. We will also request copies of your recent mammogram and Pap smear from [facility]. We plan to recheck your A1c in three months. You declined the flu shot.  INSTRUCTIONS:  Please continue to monitor your blood sugar levels and blood pressure at home. If you notice any significant changes or if you experience any symptoms such as dizziness or lightheadedness, please contact us immediately. We will be in touch with Dr. Jennelle Human regarding your Clonidine dose. Please return in three months for a follow-up visit and to recheck your A1c.

## 2023-02-02 NOTE — Assessment & Plan Note (Signed)
Clinically stable, continues to follow with cardiology.

## 2023-02-02 NOTE — Assessment & Plan Note (Addendum)
  Blood pressure on the lower side, possibly due to multiple antihypertensive medications (Lisinopril, Metoprolol, Clonidine). -Communicate with Dr. Jennelle Human regarding the possibility of adjusting Clonidine dose due to low blood pressure. - she is on a beta blocker per cardiology due to CAD -  BP Readings from Last 3 Encounters:  02/01/23 92/62  01/11/23 109/73  12/20/22 106/66

## 2023-02-02 NOTE — Assessment & Plan Note (Addendum)
  Improved glycemic control with A1c of 6.3, down from 6.5 and 6.7. However, frequent hypoglycemic episodes with blood glucose levels dropping to 60-80. -Reduce Tresiba insulin from 12 units to 10 units daily. -Continue farxiga. -Check blood glucose levels regularly and report if levels continue to drop below 80.

## 2023-02-05 ENCOUNTER — Other Ambulatory Visit: Payer: Self-pay | Admitting: Cardiovascular Disease

## 2023-02-11 ENCOUNTER — Other Ambulatory Visit: Payer: Self-pay | Admitting: Psychiatry

## 2023-02-11 DIAGNOSIS — F4001 Agoraphobia with panic disorder: Secondary | ICD-10-CM

## 2023-02-15 ENCOUNTER — Other Ambulatory Visit: Payer: Self-pay

## 2023-02-15 ENCOUNTER — Telehealth: Payer: Self-pay | Admitting: Psychiatry

## 2023-02-15 DIAGNOSIS — F4001 Agoraphobia with panic disorder: Secondary | ICD-10-CM

## 2023-02-15 MED ORDER — ALPRAZOLAM 0.5 MG PO TABS
0.5000 mg | ORAL_TABLET | Freq: Three times a day (TID) | ORAL | 0 refills | Status: DC | PRN
Start: 2023-02-15 — End: 2023-03-28

## 2023-02-15 NOTE — Telephone Encounter (Signed)
Pended.

## 2023-02-15 NOTE — Telephone Encounter (Signed)
Pt called and said that the pharmacy doesn't have xanax script. It looks like it went to print instead of going electronic

## 2023-02-19 ENCOUNTER — Other Ambulatory Visit: Payer: Self-pay | Admitting: Psychiatry

## 2023-02-19 DIAGNOSIS — F4001 Agoraphobia with panic disorder: Secondary | ICD-10-CM

## 2023-02-19 DIAGNOSIS — F411 Generalized anxiety disorder: Secondary | ICD-10-CM

## 2023-02-25 ENCOUNTER — Ambulatory Visit: Payer: 59 | Admitting: Psychiatry

## 2023-03-05 ENCOUNTER — Other Ambulatory Visit: Payer: Self-pay | Admitting: Vascular Surgery

## 2023-03-08 ENCOUNTER — Ambulatory Visit: Payer: 59 | Admitting: Psychiatry

## 2023-03-15 ENCOUNTER — Encounter: Payer: Self-pay | Admitting: Psychiatry

## 2023-03-15 ENCOUNTER — Ambulatory Visit (INDEPENDENT_AMBULATORY_CARE_PROVIDER_SITE_OTHER): Payer: 59 | Admitting: Psychiatry

## 2023-03-15 DIAGNOSIS — F5105 Insomnia due to other mental disorder: Secondary | ICD-10-CM

## 2023-03-15 DIAGNOSIS — F3162 Bipolar disorder, current episode mixed, moderate: Secondary | ICD-10-CM | POA: Diagnosis not present

## 2023-03-15 DIAGNOSIS — F411 Generalized anxiety disorder: Secondary | ICD-10-CM | POA: Diagnosis not present

## 2023-03-15 DIAGNOSIS — F4001 Agoraphobia with panic disorder: Secondary | ICD-10-CM | POA: Diagnosis not present

## 2023-03-15 NOTE — Progress Notes (Signed)
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 associated with atypical antipsychotics, as well as potential risk for movement side effects. Advised pt to contact office if movement side effects occur.  Metabolic side effects are unlikely at this low dose of quetiapine and almost no risk of EPS at this low-dose. DM managed  We discussed the short-term risks associated with benzodiazepines including sedation and increased fall risk among others.  Discussed long-term side effect risk including dependence, potential withdrawal symptoms, and the potential eventual dose-related risk of dementia.  But recent studies from 2020 dispute this association between benzodiazepines and dementia risk. Newer studies in 2020 do not support an association with dementia.  Supportive therapy around handling job stress which is major trigger.  Also around pressured move from house to apt this month.   Has RTW and doing  ok.  Follow-up 2 mos  Meredith Staggers MD, DFAPA  Please see After Visit Summary for patient specific instructions.   Future Appointments  Date Time Provider Department Center  05/03/2023  3:00 PM Sandford Craze, NP [facility]  06/07/2023  8:00 AM [facility] 6 [facility]  06/07/2023  9:00 AM [facility]  06/07/2023  9:45 AM [facility]    No orders of the defined types were placed in this encounter.      -------------------------------

## 2023-03-18 ENCOUNTER — Other Ambulatory Visit: Payer: Self-pay | Admitting: Psychiatry

## 2023-03-18 DIAGNOSIS — F4001 Agoraphobia with panic disorder: Secondary | ICD-10-CM

## 2023-03-18 DIAGNOSIS — F411 Generalized anxiety disorder: Secondary | ICD-10-CM

## 2023-03-18 NOTE — Telephone Encounter (Signed)
LF 09/22; LV 10/15; NV 12/18

## 2023-03-18 NOTE — Telephone Encounter (Signed)
ADDED DX F41.1

## 2023-03-20 ENCOUNTER — Other Ambulatory Visit: Payer: Self-pay | Admitting: Cardiology

## 2023-03-25 ENCOUNTER — Other Ambulatory Visit: Payer: Self-pay | Admitting: Psychiatry

## 2023-03-25 DIAGNOSIS — F4001 Agoraphobia with panic disorder: Secondary | ICD-10-CM

## 2023-03-31 ENCOUNTER — Ambulatory Visit: Payer: 59 | Admitting: Psychiatry

## 2023-04-26 ENCOUNTER — Encounter (HOSPITAL_COMMUNITY): Payer: 59

## 2023-04-26 ENCOUNTER — Ambulatory Visit: Payer: 59

## 2023-05-03 ENCOUNTER — Ambulatory Visit (INDEPENDENT_AMBULATORY_CARE_PROVIDER_SITE_OTHER): Payer: 59 | Admitting: Family

## 2023-05-03 ENCOUNTER — Telehealth: Payer: Self-pay | Admitting: Family

## 2023-05-03 VITALS — BP 93/57 | HR 79 | Temp 98.5°F | Resp 16 | Ht 64.0 in | Wt 131.0 lb

## 2023-05-03 DIAGNOSIS — F411 Generalized anxiety disorder: Secondary | ICD-10-CM | POA: Diagnosis not present

## 2023-05-03 DIAGNOSIS — E118 Type 2 diabetes mellitus with unspecified complications: Secondary | ICD-10-CM

## 2023-05-03 DIAGNOSIS — I10 Essential (primary) hypertension: Secondary | ICD-10-CM

## 2023-05-03 DIAGNOSIS — Z794 Long term (current) use of insulin: Secondary | ICD-10-CM

## 2023-05-03 MED ORDER — TRESIBA FLEXTOUCH 100 UNIT/ML ~~LOC~~ SOPN: 7.0000 [IU] | PEN_INJECTOR | Freq: Every day | SUBCUTANEOUS | Status: DC

## 2023-05-03 MED ORDER — DAPAGLIFLOZIN PROPANEDIOL 10 MG PO TABS: 10.0000 mg | ORAL_TABLET | Freq: Every day | ORAL | 1 refills | Status: DC

## 2023-05-03 NOTE — Telephone Encounter (Signed)
Electronic request sent for both

## 2023-05-03 NOTE — Patient Instructions (Signed)
VISIT SUMMARY:  During today's visit, we discussed your ongoing issues with low blood sugar, anxiety, and general health maintenance. We reviewed your current medications and made some adjustments to help manage your conditions more effectively.  YOUR PLAN:  -HYPERTENSION: Hypertension, or high blood pressure, is being managed with your current medications. Although your blood pressure readings are low, you are not experiencing any symptoms. Continue taking Clonidine, Lisinopril, and Metoprolol as prescribed. Discuss the effectiveness of Clonidine with your psychiatrist at your upcoming appointment.  -DIABETES MELLITUS: Diabetes Mellitus is a condition where your blood sugar levels are too high. You have been experiencing low blood sugar readings. We have reduced your Tresiba dose to 7 units daily. Please check your blood glucose regularly and let us know if your readings continue to be under 80.  -ANXIETY: Anxiety is a condition that causes feelings of worry and stress. You mentioned that your anxiety is not well controlled with Clonidine. Please discuss this with your psychiatrist at your upcoming appointment on 05/15/2023 or 05/18/2023.  -PREVENTIVE HEALTH: Preventive health measures are important for early detection and management of potential health issues. Please obtain your recent mammogram and Pap smear results from [facility], and schedule an eye exam with your new provider. Continue with your current immunizations, as your Flu and COVID vaccines are up to date.  -FOOT EXAM: During your foot exam, we noted decreased sensation in your left foot compared to your right. There were no ulcers or calluses. Continue with regular foot exams to monitor any changes.  -MEDICATION REFILL: We have sent a 90-day refill for Farxiga to [facility].  INSTRUCTIONS:  Please check your kidney function and A1c levels today. Follow up in 3 months or sooner if your blood sugar readings continue  to be low.

## 2023-05-03 NOTE — Assessment & Plan Note (Signed)
  Patient reports increased anxiety, currently on Clonidine. -Discuss with psychiatrist at upcoming appointment

## 2023-05-03 NOTE — Progress Notes (Signed)
Subjective:     Patient ID: Bailey Greene, female    DOB: Nov 04, 1960, 62 y.o.   MRN: [id number]  Chief Complaint  Patient presents with   Diabetes    Here for follow up   Anxiety    Here for follow up    HPI  Discussed the use of AI scribe software for clinical note transcription with the patient, who gave verbal consent to proceed.  History of Present Illness   The patient, with a history of diabetes and anxiety, presents for a routine follow-up. She reports persistent low blood sugar readings, particularly in the morning and sometimes in the evening. The lowest readings are in the 60s, and the highest is in the 80s. These low readings are associated with feelings of jitteriness and shakiness. The patient is currently on Tresiba insulin, which was recently reduced from twelve units to ten units, and [place].  In addition to the low blood sugar, the patient also reports that her anxiety is not well controlled. She is currently on clonidine, which was prescribed by her psychiatrist, Dr. Alcide Clever. The patient reports that she does not feel the clonidine is working as well as it used to, and she plans to discuss this with Dr. Alcide Clever at her upcoming appointment. Despite the low blood pressure readings, the patient denies any symptoms of dizziness.  The patient also mentions that she recently moved, which has been a source of stress. She is not happy with her new living situation, but she does not plan to move again in the near future.       Lab Results  Component Value Date   HGBA1C 6.3 (H) 12/08/2022   HGBA1C 6.5 10/05/2022   HGBA1C 6.7 (H) 07/06/2022   Lab Results  Component Value Date   MICROALBUR <0.7 02/01/2023   LDLCALC 70 10/05/2022   CREATININE 1.28 (H) 02/01/2023      Health Maintenance Due  Topic Date Due   MAMMOGRAM  03/11/2022   OPHTHALMOLOGY EXAM  02/26/2023   Cervical Cancer Screening (HPV/Pap Cotest)  03/12/2023    Past Medical History:  Diagnosis Date    Anxiety    Bipolar affective disorder (HCC)    CAD (coronary artery disease) 2006   CABG w/ LIMA-LAD, RIMA-Diag, SVG-OM1-OM2, R radial-PDA   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   Qualifier: Diagnosis of  By: Cheri Guppy     Diabetes mellitus type 2 in nonobese (HCC) 07/2016   Dyslipidemia    Headache(784.0)    History of pulmonary embolism 07/08/2020   HTN (hypertension)    Migraine    NSTEMI (non-ST elevated myocardial infarction) (HCC)    PAD (peripheral artery disease) (HCC)    Pulmonary embolus (HCC)    unprovoked    Past Surgical History:  Procedure Laterality Date   AORTOGRAM N/A 12/08/2022   Procedure: AORTOGRAM;  Surgeon: Leonie Douglas, MD;  Location: [facility];  Service: Vascular;  Laterality: N/A;   BUNIONECTOMY  08/2011   right foot   CARDIAC CATHETERIZATION  2007   severe native 3 v dz, all grafts patent (LIMA-LAD, RIMA-Diag, SVG-OM1-OM2, R radial-PDA)   CESAREAN SECTION  1984   CORONARY ARTERY BYPASS GRAFT  2006    Coronary artery bypass grafting x5 with a left  internal  mammary to the left anterior descending coronary artery.  Free right  internal mammary to the diagonal coronary artery, sequential reverse  saphenous vein graft to the first and second obtuse marginal, right  artery bypass to the posterior descending coronary  artery with endo-vein harvesting.   ENDARTERECTOMY FEMORAL Right 12/08/2022   Procedure: RIGHT COMMON FEMORAL ENDARTERECTOMY;  Surgeon: Leonie Douglas, MD;  Location[facility] OR;  Service: Vascular;  Laterality: Right;   INSERTION OF ILIAC STENT Bilateral 12/08/2022   Procedure: INSERTION OF BILATERAL ILIAC KISSING STENTS USING VIABAHN VBX STENTS;  Surgeon: Leonie Douglas, MD;  Location: [facility];  Service: Vascular;  Laterality: Bilateral;   LEFT HEART CATH AND CORS/GRAFTS ANGIOGRAPHY N/A 05/05/2021   Procedure: LEFT HEART CATH AND CORS/GRAFTS ANGIOGRAPHY;  Surgeon: Marykay Lex, MD;  Location: [facility] INVASIVE CV LAB;  Service: Cardiovascular;  Laterality:  N/A;   ULTRASOUND GUIDANCE FOR VASCULAR ACCESS Left 12/08/2022   Procedure: ULTRASOUND GUIDANCE FOR VASCULAR ACCESS OF LEFT FEMORAL ARTERY;  Surgeon: Leonie Douglas, MD;  Location: [facility];  Service: Vascular;  Laterality: Left;   UMBILICAL HERNIA REPAIR N/A 01/16/2021   Procedure: UMBILICAL  HERNIA REPAIR;  Surgeon: Fritzi Mandes, MD;  Location: [facility] OR;  Service: General;  Laterality: N/A;    Family History  Problem Relation Age of Onset   Lupus Mother    Heart attack Father    Diabetes Father    Hypertension Father    Diabetes Paternal Grandmother    Hypertension Paternal Grandmother    Stroke Neg Hx    Kidney disease Neg Hx    Hyperlipidemia Neg Hx    Sudden death Neg Hx     Social History   Socioeconomic History   Marital status: Legally Separated    Spouse name: Not on file   Number of children: Not on file   Years of education: Not on file   Highest education level: Some college, no degree  Occupational History   Occupation: Armed forces training and education officer  Tobacco Use   Smoking status: Former    Current packs/day: 0.00    Types: Cigarettes    Start date: 09/12/1996    Quit date: 09/12/2021    Years since quitting: 1.6   Smokeless tobacco: Never   Tobacco comments:    0.5 pack cigarettes  per month per patient   Vaping Use   Vaping status: Never Used  Substance and Sexual Activity   Alcohol use: No    Alcohol/week: 0.0 standard drinks of alcohol   Drug use: Never   Sexual activity: Yes    Partners: Male    Comment: married  Other Topics Concern   Not on file  Social History Narrative   Regular exercise:  3 days weeklyCaffeine    Use:  1 soda daily   One child biological daughter born in 74 and an adopted niece.[employer]- Engineer, technical sales   Married- may be divorcing.    Social Determinants of Health   Financial Resource Strain: Medium Risk (10/05/2022)   Overall Financial Resource Strain (CARDIA)    Difficulty of Paying Living Expenses: Somewhat hard   Food Insecurity: Food Insecurity Present (10/05/2022)   Hunger Vital Sign    Worried About Running Out of Food in the Last Year: Sometimes true    Ran Out of Food in the Last Year: Sometimes true  Transportation Needs: No Transportation Needs (05/03/2023)   PRAPARE - Administrator, Civil Service (Medical): No    Lack of Transportation (Non-Medical): No  Physical Activity: Insufficiently Active (10/05/2022)   Exercise Vital Sign    Days of Exercise per Week: 3 days    Minutes of Exercise per Session: 20 min  Stress: Stress Concern Present (10/05/2022)  [employer] of Occupational Health - Occupational Stress Questionnaire    Feeling of Stress : Rather much  Social Connections: Unknown (10/05/2022)   Social Connection and Isolation Panel [NHANES]    Frequency of Communication with Friends and Family: Twice a week    Frequency of Social Gatherings with Friends and Family: Once a week    Attends Religious Services: Patient declined    Database administrator or Organizations: No    Attends Engineer, structural: Not on file    Marital Status: Married  Catering manager Violence: Not on file    Outpatient Medications Prior to Visit  Medication Sig Dispense Refill   ACCU-CHEK GUIDE test strip USE UP TO FOUR TIMES DAILY AS DIRECTED 400 strip 6   Accu-Chek Softclix Lancets lancets USE UP TO FOUR TIMES DAILY AS DIRECTED 100 each 12   ALPRAZolam (XANAX) 0.5 MG tablet TAKE 1 TABLET (0.5 MG TOTAL) BY MOUTH 3 (THREE) TIMES DAILY AS NEEDED FOR ANXIETY. 90 tablet 1   aspirin 81 MG EC tablet Take 1 tablet (81 mg total) by mouth daily. Swallow whole. 30 tablet 12   atorvastatin (LIPITOR) 80 MG tablet TAKE 1 TABLET BY MOUTH EVERY DAY 90 tablet 3   BD PEN NEEDLE NANO U/F 32G X 4 MM MISC USE AS DIRECTED 300 each 1   blood glucose meter kit and supplies KIT Dispense based on patient and insurance preference. Use up to four times daily as directed. (FOR ICD-9 250.00, 250.01). 1 each 0    busPIRone (BUSPAR) 30 MG tablet Take 30 mg by mouth daily.     Cholecalciferol (VITAMIN D3) 75 MCG (3000 UT) TABS Take 1 tablet by mouth daily at 6 (six) AM. 30 tablet    cilostazol (PLETAL) 100 MG tablet TAKE 1 TABLET BY MOUTH TWICE A DAY BEFORE MEALS 180 tablet 3   cloNIDine (CATAPRES) 0.1 MG tablet Take 1 tablet (0.1 mg total) by mouth 3 (three) times daily. 270 tablet 0   clopidogrel (PLAVIX) 75 MG tablet TAKE 1 TABLET BY MOUTH EVERY DAY WITH BREAKFAST 90 tablet 3   Continuous Glucose Receiver (DEXCOM G6 RECEIVER) DEVI Use as directed 1 each 0   Continuous Glucose Sensor (DEXCOM G6 SENSOR) MISC USE AS DIRECTED 3 each 1   Continuous Glucose Transmitter (DEXCOM G6 TRANSMITTER) MISC USE AS DIRECTED 1 each 1   isosorbide mononitrate (IMDUR) 120 MG 24 hr tablet TAKE 1 TABLET BY MOUTH EVERY DAY 90 tablet 3   JANUVIA 50 MG tablet TAKE 1 TABLET BY MOUTH EVERY DAY 90 tablet 1   lisinopril (ZESTRIL) 5 MG tablet TAKE 1 TABLET (5 MG TOTAL) BY MOUTH DAILY. 90 tablet 1   metoprolol succinate (TOPROL-XL) 25 MG 24 hr tablet Take 1 tablet (25 mg total) by mouth daily. 90 tablet 2   mupirocin ointment (BACTROBAN) 2 % Apply 1 application. topically 2 (two) times daily. 30 g 2   nitroGLYCERIN (NITROSTAT) 0.4 MG SL tablet PLACE 1 TABLET UNDER THE TONGUE EVERY 5 MINUTES AS NEEDED FOR CHEST PAIN. MAX 3. CALL 911 25 tablet 3   OLANZapine (ZYPREXA) 20 MG tablet TAKE 1 TABLET BY MOUTH EVERYDAY AT BEDTIME 30 tablet 0   oxyCODONE-acetaminophen (PERCOCET/ROXICET) 5-325 MG tablet Take 1-2 tablets by mouth every 4 (four) hours as needed for moderate pain. 30 tablet 0   PARoxetine (PAXIL) 40 MG tablet TAKE 1 TABLET BY MOUTH TWICE A DAY 180 tablet 0   ranolazine (RANEXA) 500 MG 12 hr tablet TAKE 1  TABLET BY MOUTH TWICE A DAY 180 tablet 3   XIIDRA 5 % SOLN Place 1 drop into both eyes in the morning and at bedtime.     FARXIGA 10 MG TABS tablet TAKE 1 TABLET BY MOUTH DAILY BEFORE BREAKFAST 30 tablet 11   insulin degludec  (TRESIBA FLEXTOUCH) 100 UNIT/ML FlexTouch Pen Inject 10 Units into the skin daily.     No facility-administered medications prior to visit.    Allergies  Allergen Reactions   Metformin And Related Nausea And Vomiting    ROS    See HPI Objective:    Physical Exam Constitutional:      General: She is not in acute distress.    Appearance: Normal appearance. She is well-developed.  HENT:     Head: Normocephalic and atraumatic.     Right Ear: External ear normal.     Left Ear: External ear normal.  Eyes:     General: No scleral icterus. Neck:     Thyroid: No thyromegaly.  Cardiovascular:     Rate and Rhythm: Normal rate and regular rhythm.     Heart sounds: Normal heart sounds. No murmur heard. Pulmonary:     Effort: Pulmonary effort is normal. No respiratory distress.     Breath sounds: Normal breath sounds. No wheezing.  Musculoskeletal:     Cervical back: Neck supple.  Skin:    General: Skin is warm and dry.  Neurological:     Mental Status: She is alert and oriented to person, place, and time.  Psychiatric:        Mood and Affect: Mood normal.        Behavior: Behavior normal.        Thought Content: Thought content normal.        Judgment: Judgment normal.      BP (!) 93/57 (BP Location: Right Arm, Patient Position: Sitting, Cuff Size: Normal)   Pulse 79   Temp 98.5 F (36.9 C) (Oral)   Resp 16   Ht 5\' 4"  (1.626 m)   Wt 131 lb (59.4 kg)   LMP 11/28/2009   SpO2 100%   BMI 22.49 kg/m  Wt Readings from Last 3 Encounters:  05/03/23 131 lb (59.4 kg)  02/01/23 130 lb (59 kg)  01/11/23 126 lb 4.8 oz (57.3 kg)       Assessment & Plan:   Problem List Items Addressed This Visit       Unprioritized   HTN (hypertension)     Blood pressure is low but patient is asymptomatic. Currently on Clonidine for anxiety, Lisinopril for kidney protection, and Metoprolol for heart health.  -Continue current medications.  -Discuss Clonidine with psychiatrist at  upcoming appointment.       Controlled diabetes mellitus type 2 with complications (HCC) - Primary     Frequent hypoglycemic episodes in the morning and sometimes in the evening. Currently on Tresiba 10 units and Farxiga.  -Reduce Evaristo Bury to 7 units daily. Continue farxiga -Check blood glucose regularly and report if sugars continue to be under 80.       Relevant Medications   insulin degludec (TRESIBA FLEXTOUCH) 100 UNIT/ML FlexTouch Pen   dapagliflozin propanediol (FARXIGA) 10 MG TABS tablet   Other Relevant Orders   HgB A1c   Basic Metabolic Panel (BMET)   Anxiety state     Patient reports increased anxiety, currently on Clonidine. -Discuss with psychiatrist at upcoming appointment        Preventive Health  -Obtain recent mammogram and  Pap smear results from [facility].  -Schedule eye exam with new provider.  -Continue with current immunizations (Flu and COVID vaccines up to date).  I have changed Marissa Calamity Farxiga to dapagliflozin propanediol. I have also changed her [employer]. I am also having her maintain her Xiidra, blood glucose meter kit and supplies, aspirin EC, mupirocin ointment, Accu-Chek Softclix Lancets, BD Pen Needle Nano U/F, Vitamin D3, isosorbide mononitrate, Accu-Chek Guide, Dexcom G6 Transmitter, Dexcom G6 Sensor, Dexcom G6 Receiver, ranolazine, busPIRone, clopidogrel, lisinopril, nitroGLYCERIN, oxyCODONE-acetaminophen, Januvia, OLANZapine, cloNIDine, atorvastatin, cilostazol, PARoxetine, metoprolol succinate, and ALPRAZolam.  Meds ordered this encounter  Medications   insulin degludec (TRESIBA FLEXTOUCH) 100 UNIT/ML FlexTouch Pen    Sig: Inject 7 Units into the skin daily.    Order Specific Question:   Supervising Provider    Answer:   Danise Edge A [[id number]]   dapagliflozin propanediol (FARXIGA) 10 MG TABS tablet    Sig: Take 1 tablet (10 mg total) by mouth daily before breakfast.    Dispense:  90 tablet    Refill:  1    Order  Specific Question:   Supervising Provider    Answer:   Danise Edge A [[id number]]

## 2023-05-03 NOTE — Assessment & Plan Note (Signed)
  Frequent hypoglycemic episodes in the morning and sometimes in the evening. Currently on Bailey Greene 10 units and Farxiga.  -Reduce Bailey Greene to 7 units daily. Continue farxiga -Check blood glucose regularly and report if sugars continue to be under 80.

## 2023-05-03 NOTE — Telephone Encounter (Signed)
Please call [facility] to request copy of pap and mammo results.

## 2023-05-03 NOTE — Assessment & Plan Note (Signed)
  Blood pressure is low but patient is asymptomatic. Currently on Clonidine for anxiety, Lisinopril for kidney protection, and Metoprolol for heart health.  -Continue current medications.  -Discuss Clonidine with psychiatrist at upcoming appointment.

## 2023-05-04 LAB — BASIC METABOLIC PANEL
BUN: 21 mg/dL (ref 6–23)
CO2: 29 meq/L (ref 19–32)
Calcium: 9.4 mg/dL (ref 8.4–10.5)
Chloride: 103 meq/L (ref 96–112)
Creatinine, Ser: 1.23 mg/dL — ABNORMAL HIGH (ref 0.40–1.20)
GFR: 47.79 mL/min — ABNORMAL LOW (ref >60.00)
Glucose, Bld: 111 mg/dL — ABNORMAL HIGH (ref 70–99)
Potassium: 4.1 meq/L (ref 3.5–5.1)
Sodium: 138 meq/L (ref 135–145)

## 2023-05-04 LAB — HEMOGLOBIN A1C: Hgb A1c MFr Bld: 6.7 % — ABNORMAL HIGH (ref 4.6–6.5)

## 2023-05-12 ENCOUNTER — Encounter: Payer: Self-pay | Admitting: Family

## 2023-05-18 ENCOUNTER — Encounter: Payer: Self-pay | Admitting: Psychiatry

## 2023-05-18 ENCOUNTER — Ambulatory Visit: Payer: 59 | Admitting: Psychiatry

## 2023-05-18 DIAGNOSIS — F5105 Insomnia due to other mental disorder: Secondary | ICD-10-CM | POA: Diagnosis not present

## 2023-05-18 DIAGNOSIS — F3162 Bipolar disorder, current episode mixed, moderate: Secondary | ICD-10-CM | POA: Diagnosis not present

## 2023-05-18 DIAGNOSIS — F4001 Agoraphobia with panic disorder: Secondary | ICD-10-CM | POA: Diagnosis not present

## 2023-05-18 DIAGNOSIS — F411 Generalized anxiety disorder: Secondary | ICD-10-CM | POA: Diagnosis not present

## 2023-05-18 NOTE — Progress Notes (Signed)
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 olanazaine stopped.  Rec Increase Off label clonidine 0.1 mg TID for severe anxiety and panic .  It has helped reduce anxiety by 50% but varies with stress.  She agrees.  Push fluids. She monitors BP  Continue high dose for another month or so bc might need more time to get max bnefit.  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   Disc SE in detail and SSRI withdrawal sx.  BC failure of alternatives, alprazolam 0.5 mg prn.  Alternatives clonazepam, Ativan  Consider switch to sertraline.  Discussed potential metabolic side effects associated with atypical antipsychotics, as well as potential risk for movement side effects. Advised pt to contact office if movement side effects occur.  Metabolic side effects are unlikely at this low dose of quetiapine and almost no risk of EPS at this low-dose. DM managed  We discussed the short-term  risks associated with benzodiazepines including sedation and increased fall risk among others.  Discussed long-term side effect risk including dependence, potential withdrawal symptoms, and the potential eventual dose-related risk of dementia.  But recent studies from 2020 dispute this association between benzodiazepines and dementia risk. Newer studies  in 2020 do not support an association with dementia. May have to pursue a change in BZ if clonidine doesn't help.  Supportive therapy around handling job stress which is major trigger.  Also around pressured move from house to apt this month.   Has RTW and doing ok.  Follow-up 2 mos  Meredith Staggers MD, DFAPA  Please see After Visit Summary for patient specific instructions.   Future Appointments  Date Time Provider Department Center  06/07/2023  8:00 AM [facility]  06/07/2023  9:00 AM [facility]  06/07/2023  9:45 AM [facility]    No orders of the defined types were placed in this encounter.      -------------------------------

## 2023-05-21 ENCOUNTER — Other Ambulatory Visit: Payer: Self-pay | Admitting: Psychiatry

## 2023-05-21 DIAGNOSIS — F411 Generalized anxiety disorder: Secondary | ICD-10-CM

## 2023-05-21 DIAGNOSIS — F4001 Agoraphobia with panic disorder: Secondary | ICD-10-CM

## 2023-05-23 DIAGNOSIS — Z0279 Encounter for issue of other medical certificate: Secondary | ICD-10-CM

## 2023-06-01 ENCOUNTER — Other Ambulatory Visit: Payer: Self-pay | Admitting: Cardiology

## 2023-06-01 ENCOUNTER — Other Ambulatory Visit: Payer: Self-pay | Admitting: Family

## 2023-06-01 ENCOUNTER — Other Ambulatory Visit: Payer: Self-pay | Admitting: Psychiatry

## 2023-06-01 ENCOUNTER — Other Ambulatory Visit: Payer: Self-pay | Admitting: Cardiovascular Disease

## 2023-06-01 DIAGNOSIS — F411 Generalized anxiety disorder: Secondary | ICD-10-CM

## 2023-06-01 DIAGNOSIS — F3162 Bipolar disorder, current episode mixed, moderate: Secondary | ICD-10-CM

## 2023-06-01 DIAGNOSIS — F4001 Agoraphobia with panic disorder: Secondary | ICD-10-CM

## 2023-06-02 ENCOUNTER — Other Ambulatory Visit: Payer: Self-pay | Admitting: Psychiatry

## 2023-06-02 DIAGNOSIS — F4001 Agoraphobia with panic disorder: Secondary | ICD-10-CM

## 2023-06-02 DIAGNOSIS — F411 Generalized anxiety disorder: Secondary | ICD-10-CM

## 2023-06-02 NOTE — Telephone Encounter (Signed)
 Disp Refills Start End   lisinopril  (ZESTRIL ) 5 MG tablet 90 tablet 1 06/01/2023 --   Sig - Route: TAKE 1 TABLET (5 MG TOTAL) BY MOUTH DAILY. - Oral   Sent to pharmacy as: lisinopril  (ZESTRIL ) 5 MG tablet   E-Prescribing Status: Receipt confirmed by pharmacy (06/01/2023  1:47 PM EST)

## 2023-06-03 ENCOUNTER — Encounter: Payer: Self-pay | Admitting: Family

## 2023-06-05 MED ORDER — SAXAGLIPTIN HCL 2.5 MG PO TABS: 2.5000 mg | ORAL_TABLET | Freq: Every day | ORAL | 1 refills | Status: DC

## 2023-06-06 ENCOUNTER — Encounter: Payer: Self-pay | Admitting: Family

## 2023-06-07 ENCOUNTER — Ambulatory Visit (INDEPENDENT_AMBULATORY_CARE_PROVIDER_SITE_OTHER)
Admission: RE | Admit: 2023-06-07 | Discharge: 2023-06-07 | Disposition: A | Discharge status: Home / Self Care | Payer: 59 | Source: Ambulatory Visit | Attending: Vascular Surgery | Admitting: Vascular Surgery

## 2023-06-07 ENCOUNTER — Ambulatory Visit (HOSPITAL_COMMUNITY)
Admission: RE | Admit: 2023-06-07 | Discharge: 2023-06-07 | Disposition: A | Discharge status: Home / Self Care | Payer: 59 | Source: Ambulatory Visit | Attending: Vascular Surgery | Admitting: Vascular Surgery

## 2023-06-07 ENCOUNTER — Ambulatory Visit: Payer: 59 | Admitting: Physician Assistant

## 2023-06-07 VITALS — BP 121/75 | HR 86 | Temp 98.4°F | Resp 18 | Ht 64.0 in | Wt 128.5 lb

## 2023-06-07 DIAGNOSIS — I739 Peripheral vascular disease, unspecified: Secondary | ICD-10-CM | POA: Insufficient documentation

## 2023-06-07 DIAGNOSIS — I70213 Atherosclerosis of native arteries of extremities with intermittent claudication, bilateral legs: Secondary | ICD-10-CM | POA: Diagnosis not present

## 2023-06-07 LAB — VAS US ABI WITH/WO TBI
Left ABI: 1.05
Right ABI: 0.75

## 2023-06-07 NOTE — Progress Notes (Signed)
 Office Note   History of Present Illness   Bailey Greene is a 63 y.o. (1960/06/21) female who presents for surveillance of PAD. She has a history of right common femoral endarterectomy and bilateral common iliac artery stenting on 12/08/2022 by Dr. Magda.  This was done for lifestyle limiting claudication of bilateral lower extremities.  She returns today for follow-up.  She states she is continue to do well since surgery.  Her claudication has continued to improve with time.  She walks about 3 to 4 days a week for at least 20 minutes without issue.  She denies any rest pain or tissue loss.  Current Outpatient Medications  Medication Sig Dispense Refill   ACCU-CHEK GUIDE test strip USE UP TO FOUR TIMES DAILY AS DIRECTED 400 strip 6   Accu-Chek Softclix Lancets lancets USE UP TO FOUR TIMES DAILY AS DIRECTED 100 each 12   ALPRAZolam  (XANAX ) 0.5 MG tablet TAKE 1 TABLET (0.5 MG TOTAL) BY MOUTH 3 (THREE) TIMES DAILY AS NEEDED FOR ANXIETY. 90 tablet 1   aspirin  81 MG EC tablet Take 1 tablet (81 mg total) by mouth daily. Swallow whole. 30 tablet 12   atorvastatin  (LIPITOR ) 80 MG tablet TAKE 1 TABLET BY MOUTH EVERY DAY 90 tablet 3   BD PEN NEEDLE NANO U/F 32G X 4 MM MISC USE AS DIRECTED 300 each 1   blood glucose meter kit and supplies KIT Dispense based on patient and insurance preference. Use up to four times daily as directed. (FOR ICD-9 250.00, 250.01). 1 each 0   busPIRone  (BUSPAR ) 30 MG tablet Take 30 mg by mouth daily.     Cholecalciferol (VITAMIN D3) 75 MCG (3000 UT) TABS Take 1 tablet by mouth daily at 6 (six) AM. 30 tablet    cilostazol  (PLETAL ) 100 MG tablet TAKE 1 TABLET BY MOUTH TWICE A DAY BEFORE MEALS 180 tablet 3   cloNIDine  (CATAPRES ) 0.1 MG tablet TAKE 1 TABLET BY MOUTH 3 TIMES DAILY. 270 tablet 0   clopidogrel  (PLAVIX ) 75 MG tablet TAKE 1 TABLET BY MOUTH EVERY DAY WITH BREAKFAST 90 tablet 3   Continuous Glucose Receiver (DEXCOM G6 RECEIVER) DEVI Use as directed 1 each 0    Continuous Glucose Sensor (DEXCOM G6 SENSOR) MISC USE AS DIRECTED 3 each 1   Continuous Glucose Transmitter (DEXCOM G6 TRANSMITTER) MISC USE AS DIRECTED 1 each 1   dapagliflozin  propanediol (FARXIGA ) 10 MG TABS tablet Take 1 tablet (10 mg total) by mouth daily before breakfast. 90 tablet 1   insulin  degludec (TRESIBA  FLEXTOUCH) 100 UNIT/ML FlexTouch Pen Inject 7 Units into the skin daily.     isosorbide  mononitrate (IMDUR ) 120 MG 24 hr tablet TAKE 1 TABLET BY MOUTH EVERY DAY 90 tablet 0   lisinopril  (ZESTRIL ) 5 MG tablet TAKE 1 TABLET (5 MG TOTAL) BY MOUTH DAILY. 90 tablet 1   metoprolol  succinate (TOPROL -XL) 25 MG 24 hr tablet Take 1 tablet (25 mg total) by mouth daily. 90 tablet 2   mupirocin  ointment (BACTROBAN ) 2 % Apply 1 application. topically 2 (two) times daily. 30 g 2   nitroGLYCERIN  (NITROSTAT ) 0.4 MG SL tablet PLACE 1 TABLET UNDER THE TONGUE EVERY 5 MINUTES AS NEEDED FOR CHEST PAIN. MAX 3. CALL 911 25 tablet 3   OLANZapine  (ZYPREXA ) 20 MG tablet TAKE 1 TABLET BY MOUTH EVERYDAY AT BEDTIME 30 tablet 1   oxyCODONE -acetaminophen  (PERCOCET/ROXICET) 5-325 MG tablet Take 1-2 tablets by mouth every 4 (four) hours as needed for moderate pain. 30 tablet 0  PARoxetine  (PAXIL ) 20 MG tablet TAKE 3 TABLETS (60 MG TOTAL) BY MOUTH DAILY. AS OF 04/08/22 90 tablet 1   PARoxetine  (PAXIL ) 40 MG tablet TAKE 1 TABLET BY MOUTH TWICE A DAY 180 tablet 0   ranolazine  (RANEXA ) 500 MG 12 hr tablet TAKE 1 TABLET BY MOUTH TWICE A DAY 180 tablet 3   saxagliptin  HCl (ONGLYZA) 2.5 MG TABS tablet Take 1 tablet (2.5 mg total) by mouth daily. 90 tablet 1   XIIDRA  5 % SOLN Place 1 drop into both eyes in the morning and at bedtime.     No current facility-administered medications for this visit.    REVIEW OF SYSTEMS (negative unless checked):   Cardiac:  []  Chest pain or chest pressure? []  Shortness of breath upon activity? []  Shortness of breath when lying flat? []  Irregular heart rhythm?  Vascular:  []  Pain in  calf, thigh, or hip brought on by walking? []  Pain in feet at night that wakes you up from your sleep? []  Blood clot in your veins? []  Leg swelling?  Pulmonary:  []  Oxygen at home? []  Productive cough? []  Wheezing?  Neurologic:  []  Sudden weakness in arms or legs? []  Sudden numbness in arms or legs? []  Sudden onset of difficult speaking or slurred speech? []  Temporary loss of vision in one eye? []  Problems with dizziness?  Gastrointestinal:  []  Blood in stool? []  Vomited blood?  Genitourinary:  []  Burning when urinating? []  Blood in urine?  Psychiatric:  []  Major depression  Hematologic:  []  Bleeding problems? []  Problems with blood clotting?  Dermatologic:  []  Rashes or ulcers?  Constitutional:  []  Fever or chills?  Ear/Nose/Throat:  []  Change in hearing? []  Nose bleeds? []  Sore throat?  Musculoskeletal:  []  Back pain? []  Joint pain? []  Muscle pain?   Physical Examination   Vitals:   06/07/23 0835  BP: 121/75  Pulse: 86  Resp: 18  Temp: 98.4 F (36.9 C)  TempSrc: Temporal  SpO2: 95%  Weight: 128 lb 8 oz (58.3 kg)  Height: 5' 4 (1.626 m)   Body mass index is 22.06 kg/m.  General:  WDWN in NAD; vital signs documented above Gait: Not observed HENT: WNL, normocephalic Pulmonary: normal non-labored breathing , without rales, rhonchi,  wheezing Cardiac: regular Abdomen: soft, NT, no masses Skin: without rashes Vascular Exam/Pulses: Right DP/PT Doppler signals, palpable left DP Extremities: without ischemic changes, without gangrene , without cellulitis; without open wounds;  Musculoskeletal: no muscle wasting or atrophy  Neurologic: A&O X 3;  No focal weakness or paresthesias are detected Psychiatric:  The pt has Normal affect.  Non-Invasive Vascular imaging   ABI (06/07/2023) R:  ABI: 0.75 (0.65),  PT: mono DP: mono TBI:  0.48 L:  ABI: 1.05 (0.95),  PT: tri DP: bi TBI: 0.7  Aortoiliac Duplex (06/07/2023) Patent bilateral common  iliac artery stents without stenosis   Medical Decision Making   Bailey Greene is a 63 y.o. female who presents for surveillance of PAD  Based on the patient's vascular studies, her ABIs are improved bilaterally.  Her right ABI 0.75 and left ABI is 1.05 Aortoiliac duplex demonstrates patent bilateral common iliac artery stents without stenosis She states her claudication continues to improve.  She denies any rest pain or tissue loss.  She has a palpable left DP pulse.  She has DP/PT Doppler signals on the right She can follow-up with our office in 6 months with repeat aortoiliac duplex and ABIs   Bailey Portell PA-C [facility] Office: [phone]  Clinic MD: [facility]

## 2023-06-14 ENCOUNTER — Other Ambulatory Visit: Payer: Self-pay | Admitting: Psychiatry

## 2023-06-14 DIAGNOSIS — F4001 Agoraphobia with panic disorder: Secondary | ICD-10-CM

## 2023-06-15 NOTE — Telephone Encounter (Signed)
 LF 11/30; LV 12/18

## 2023-06-21 ENCOUNTER — Other Ambulatory Visit: Payer: Self-pay

## 2023-06-21 DIAGNOSIS — I70213 Atherosclerosis of native arteries of extremities with intermittent claudication, bilateral legs: Secondary | ICD-10-CM

## 2023-06-21 DIAGNOSIS — I739 Peripheral vascular disease, unspecified: Secondary | ICD-10-CM

## 2023-06-22 ENCOUNTER — Other Ambulatory Visit: Payer: Self-pay | Admitting: Psychiatry

## 2023-06-22 DIAGNOSIS — F411 Generalized anxiety disorder: Secondary | ICD-10-CM

## 2023-06-22 DIAGNOSIS — F4001 Agoraphobia with panic disorder: Secondary | ICD-10-CM

## 2023-06-27 ENCOUNTER — Other Ambulatory Visit: Payer: Self-pay | Admitting: Family

## 2023-06-27 DIAGNOSIS — E119 Type 2 diabetes mellitus without complications: Secondary | ICD-10-CM

## 2023-07-01 NOTE — Telephone Encounter (Signed)
Noted working on a PA for quantity

## 2023-07-04 NOTE — Telephone Encounter (Signed)
Tried to submit a prior authorization for Paroxetine 40 mg #60 for 30 day, response was member to call back of card.   Will contact [employer] to clarify.

## 2023-07-10 NOTE — Telephone Encounter (Signed)
 Will contact [employer] if pt unable to fill. PA submitted on 02/03 and response was to have pt contact back of card

## 2023-07-20 ENCOUNTER — Telehealth (INDEPENDENT_AMBULATORY_CARE_PROVIDER_SITE_OTHER): Payer: 59 | Admitting: Psychiatry

## 2023-07-20 ENCOUNTER — Encounter: Payer: Self-pay | Admitting: Psychiatry

## 2023-07-20 DIAGNOSIS — F4001 Agoraphobia with panic disorder: Secondary | ICD-10-CM

## 2023-07-20 DIAGNOSIS — F3162 Bipolar disorder, current episode mixed, moderate: Secondary | ICD-10-CM

## 2023-07-20 DIAGNOSIS — F411 Generalized anxiety disorder: Secondary | ICD-10-CM

## 2023-07-20 DIAGNOSIS — F5105 Insomnia due to other mental disorder: Secondary | ICD-10-CM

## 2023-07-20 DIAGNOSIS — F99 Mental disorder, not otherwise specified: Secondary | ICD-10-CM

## 2023-07-20 MED ORDER — CLONIDINE ER 0.17 MG PO TB24: 1.0000 | ORAL_TABLET | ORAL | 0 refills | Status: DC

## 2023-07-20 MED ORDER — CLONAZEPAM 1 MG PO TABS
ORAL_TABLET | ORAL | 0 refills | Status: DC
Start: 2023-07-20 — End: 2023-08-03

## 2023-07-20 NOTE — Progress Notes (Signed)
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   LYMPHSABS 2.2 02/25/2022 0950   MONOABS 0.4 02/25/2022 0950   EOSABS 0.3 02/25/2022 0950   BASOSABS 0.0 02/25/2022 0950    No results found for: "POCLITH", "LITHIUM"   No results found for: "PHENYTOIN", "PHENOBARB", "VALPROATE", "CBMZ"   .res Assessment: Plan:    Jaslen was seen today for follow-up, anxiety and depression.  Diagnoses and all orders for this visit:  Moderate mixed bipolar I disorder (HCC)  Panic disorder with agoraphobia -     cloNIDine ER 0.17 MG TB24; Take 1 tablet by mouth every 30 (thirty) days. -     clonazePAM (KLONOPIN) 1 MG tablet; 1/2 tablet in the morning and 1 tablet at nightg  Generalized anxiety disorder -     cloNIDine ER 0.17 MG TB24; Take 1 tablet by mouth every 30 (thirty) days. -     clonazePAM (KLONOPIN) 1 MG tablet; 1/2 tablet in the morning and 1 tablet at nightg  Insomnia due to mental condition -     clonazePAM (KLONOPIN)  1 MG tablet; 1/2 tablet in the morning and 1  tablet at nightg    Chronic work stress  30 min face to face time with patient was spent on counseling and coordination of care. We discussed the following:  Repeated bouts of STD DT anxiety and depression. Usually associated lately with increased work volume demands .  she is more anxious and depressed with panic lately affecting work.  A forced move is contributing to the stress.   Also recent surgery  Continue olanzapine 20 mg 3 hours before HS. Stoppped Depakote ER without difficulty.  Consider option of Abilify aug but might not sleep if olanazaine stopped.  Good benefit clonidine bugt Duration too short with clonidine.  Switch to clonidine ER 0.17 mg AM  Push fluids. She monitors BP  Continue high dose for another month or so bc might need more time to get max bnefit.  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   Disc SE in detail and SSRI withdrawal sx.  Short duration of alprazolam switch to clonazepam 1 mg HS, 0.5 mg AM  Consider switch to sertraline.  Discussed potential metabolic side effects associated with atypical antipsychotics, as well as potential risk for movement side effects. Advised pt to contact office if movement side effects occur.  Metabolic side effects are unlikely at this low dose of quetiapine and almost no risk of EPS at this low-dose. DM managed  We discussed the short-term risks associated with benzodiazepines including sedation and increased fall risk among others.  Discussed long-term side effect risk including dependence, potential withdrawal symptoms, and the potential eventual dose-related risk of dementia.  But recent studies from 2020 dispute this association between benzodiazepines and dementia risk. Newer studies in 2020 do not support an association with dementia. May have to pursue a change in BZ if clonidine doesn't help.  Supportive  therapy around handling job stress which is major trigger.  Also around pressured move from house to apt this month.   Has RTW and doing ok.  Follow-up 2 mos  Meredith Staggers MD, DFAPA  Please see After Visit Summary for patient specific instructions.   Future Appointments  Date Time Provider Department Center  12/20/2023  8:00 AM [facility]  12/20/2023  9:00 AM [facility]  12/20/2023  9:30 AM [facility]    No orders of the defined types were placed in this encounter.      -------------------------------

## 2023-08-02 ENCOUNTER — Telehealth: Payer: Self-pay

## 2023-08-02 ENCOUNTER — Other Ambulatory Visit: Payer: Self-pay | Admitting: Psychiatry

## 2023-08-02 DIAGNOSIS — F4001 Agoraphobia with panic disorder: Secondary | ICD-10-CM

## 2023-08-02 DIAGNOSIS — F411 Generalized anxiety disorder: Secondary | ICD-10-CM

## 2023-08-02 MED ORDER — PAROXETINE HCL 40 MG PO TABS: 40.0000 mg | ORAL_TABLET | Freq: Two times a day (BID) | ORAL | 0 refills | Status: DC

## 2023-08-02 NOTE — Telephone Encounter (Signed)
 FYI Pt has to fill a 90 day supply of Paroxetine, should work at [employer] or [employer], no PA needed.

## 2023-08-03 ENCOUNTER — Encounter: Payer: Self-pay | Admitting: Psychiatry

## 2023-08-03 ENCOUNTER — Ambulatory Visit: Payer: 59 | Admitting: Psychiatry

## 2023-08-03 DIAGNOSIS — F4001 Agoraphobia with panic disorder: Secondary | ICD-10-CM | POA: Diagnosis not present

## 2023-08-03 DIAGNOSIS — F411 Generalized anxiety disorder: Secondary | ICD-10-CM | POA: Diagnosis not present

## 2023-08-03 DIAGNOSIS — F5105 Insomnia due to other mental disorder: Secondary | ICD-10-CM | POA: Diagnosis not present

## 2023-08-03 DIAGNOSIS — F3162 Bipolar disorder, current episode mixed, moderate: Secondary | ICD-10-CM

## 2023-08-03 MED ORDER — CLONAZEPAM 0.5 MG PO TABS: ORAL_TABLET | ORAL | 1 refills | Status: DC

## 2023-08-03 MED ORDER — CLONIDINE ER 0.17 MG PO TB24: 1.0000 | ORAL_TABLET | Freq: Every morning | ORAL | 1 refills | Status: DC

## 2023-08-03 NOTE — Progress Notes (Signed)
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       Component Value Date/Time   WBC 10.7 (H) 12/09/2022 0330   RBC 2.97 (L) 12/09/2022 0330   HGB 8.9 (L) 12/09/2022 0330   HCT 25.9 (L) 12/09/2022 0330   PLT 148 (L) 12/09/2022 0330   MCV 87.2 12/09/2022 0330   MCH 30.0 12/09/2022 0330   MCHC 34.4 12/09/2022 0330   RDW 13.8 12/09/2022 0330   LYMPHSABS 2.2 02/25/2022 0950   MONOABS 0.4 02/25/2022 0950   EOSABS 0.3 02/25/2022 0950   BASOSABS 0.0 02/25/2022 0950    No results found for: "POCLITH", "LITHIUM"   No results found for: "PHENYTOIN", "PHENOBARB", "VALPROATE", "CBMZ"   .res Assessment: Plan:    Bailey Greene was seen today for follow-up, anxiety, depression and stress.  Diagnoses and all orders for this visit:  Panic disorder with agoraphobia -     clonazePAM (KLONOPIN) 0.5 MG tablet; 1 tablet in the morning and 1 or 1 and 1/2 tablets in the evening -     cloNIDine ER 0.17 MG TB24; Take 1 tablet by mouth every morning.  Generalized anxiety  disorder -  clonazePAM (KLONOPIN) 0.5 MG tablet; 1 tablet in the morning and 1 or 1 and 1/2 tablets in the evening -     cloNIDine ER 0.17 MG TB24; Take 1 tablet by mouth every morning.  Moderate mixed bipolar I disorder (HCC) -     cloNIDine ER 0.17 MG TB24; Take 1 tablet by mouth every morning.   Chronic work stress  30 min face to face time with patient was spent on counseling and coordination of care. We discussed the following:  Repeated bouts of STD DT anxiety and depression. Usually associated lately with increased work volume demands .  she is more anxious and depressed with panic lately affecting work.    Continue olanzapine 20 mg 3 hours before HS.  Good benefit clonidine for anxiety and irritability off label.  but Duration too short with IR clonidine.   Switched to clonidine ER 0.17 mg AM.  Which is better.  More calm with H .  Dont' get as irritated.    Push fluids. She monitors BP  Continue high dose paroxetine off label DT TRD and TR anxiety to 80 mg daily since early June 2024  Disc risk high dose.   Disc SE in detail and SSRI withdrawal sx.  Better  duration after switch to clonazepam 1 mg HS, 0.5 mg AM.  But hangover. So reduce clonazepam 0.5 mg AM and 0.5-0.75 mg HS  Consider switch to sertraline.  Discussed potential metabolic side effects associated with atypical antipsychotics, as well as potential risk for movement side effects. Advised pt to contact office if movement side effects occur.  DM managed  We discussed the short-term risks associated with benzodiazepines including sedation and increased fall risk among others.  Discussed long-term side effect risk including dependence, potential withdrawal symptoms, and the potential eventual dose-related risk of dementia.  But recent studies from 2020 dispute this association between benzodiazepines and dementia risk. Newer studies in 2020 do not support an association with dementia. May have to pursue a change  in BZ if clonidine doesn't help.  Counseling 20 min: solutions focused therapy around handling job stress which is major trigger.  Also around pressured from H in the hospital.  CBT.   Could potentially benefit more with counseling but time limitations  Has RTW but very stressed with work volume and pressure.  Follow-up 2 mos  Meredith Staggers MD, DFAPA  Please see After Visit Summary for patient specific instructions.   Future Appointments  Date Time Provider Department Center  12/20/2023  8:00 AM [facility]  12/20/2023  9:00 AM [facility]  12/20/2023  9:30 AM [facility]    No orders of the defined types were placed in this encounter.      -------------------------------

## 2023-08-15 ENCOUNTER — Telehealth: Payer: Self-pay | Admitting: Family

## 2023-08-15 ENCOUNTER — Encounter: Payer: Self-pay | Admitting: Family

## 2023-08-15 ENCOUNTER — Ambulatory Visit: Admitting: Family

## 2023-08-15 VITALS — BP 116/75 | HR 85 | Temp 97.9°F | Resp 16 | Ht 64.0 in | Wt 125.0 lb

## 2023-08-15 DIAGNOSIS — R634 Abnormal weight loss: Secondary | ICD-10-CM | POA: Diagnosis not present

## 2023-08-15 DIAGNOSIS — N1831 Chronic kidney disease, stage 3a: Secondary | ICD-10-CM

## 2023-08-15 DIAGNOSIS — Z794 Long term (current) use of insulin: Secondary | ICD-10-CM | POA: Diagnosis not present

## 2023-08-15 DIAGNOSIS — I10 Essential (primary) hypertension: Secondary | ICD-10-CM | POA: Diagnosis not present

## 2023-08-15 DIAGNOSIS — E118 Type 2 diabetes mellitus with unspecified complications: Secondary | ICD-10-CM | POA: Diagnosis not present

## 2023-08-15 DIAGNOSIS — Z1211 Encounter for screening for malignant neoplasm of colon: Secondary | ICD-10-CM

## 2023-08-15 LAB — BASIC METABOLIC PANEL
BUN: 18 mg/dL (ref 6–23)
CO2: 26 meq/L (ref 19–32)
Calcium: 9.8 mg/dL (ref 8.4–10.5)
Chloride: 108 meq/L (ref 96–112)
Creatinine, Ser: 1.22 mg/dL — ABNORMAL HIGH (ref 0.40–1.20)
GFR: 48.17 mL/min — ABNORMAL LOW (ref >60.00)
Glucose, Bld: 97 mg/dL (ref 70–99)
Potassium: 4.2 meq/L (ref 3.5–5.1)
Sodium: 143 meq/L (ref 135–145)

## 2023-08-15 LAB — HEMOGLOBIN A1C: Hgb A1c MFr Bld: 6.8 % — ABNORMAL HIGH (ref 4.6–6.5)

## 2023-08-15 LAB — T4, FREE: Free T4: 0.77 ng/dL (ref 0.60–1.60)

## 2023-08-15 LAB — MICROALBUMIN / CREATININE URINE RATIO
Creatinine,U: 37.5 mg/dL
Microalb Creat Ratio: UNDETERMINED mg/g (ref 0.0–30.0)
Microalb, Ur: <0.7 mg/dL

## 2023-08-15 LAB — TSH: TSH: 0.68 u[IU]/mL (ref 0.35–5.50)

## 2023-08-15 MED ORDER — LISINOPRIL 2.5 MG PO TABS: 2.5000 mg | ORAL_TABLET | Freq: Every day | ORAL | 1 refills | Status: DC

## 2023-08-15 MED ORDER — METOPROLOL SUCCINATE ER 25 MG PO TB24: 12.5000 mg | ORAL_TABLET | Freq: Every day | ORAL | Status: DC

## 2023-08-15 NOTE — Telephone Encounter (Signed)
-----   Message from Rollene Rotunda sent at 08/15/2023  1:30 PM EDT ----- OK ----- Message ----- From: Sandford Craze, NP Sent: 08/15/2023  10:16 AM EDT To: Rollene Rotunda, MD  Hi Dr. Antoine Poche,  She has been having issues with hypotension. Her pschy is giving her clonidine for anxiety and does not wish to stop the clonidine.  She is on toprol xl 25mg .  Would you be ok with me cutting that dose in half?   Thanks,  [employer]

## 2023-08-15 NOTE — Assessment & Plan Note (Signed)
>>  ASSESSMENT AND PLAN FOR ESSENTIAL HYPERTENSION WRITTEN ON 08/15/2023 10:19 AM BY O'SULLIVAN, Raphael Fitzpatrick, NP  BP low.   Low blood pressure likely due to antihypertensive medications. Clonidine  for anxiety and lisinopril  for kidney protection complicate management. - Reduce lisinopril  dose to 2.5 mg daily. - Consult cardiologist regarding possible metoprolol  adjustment. - Schedule follow-up in two weeks with blood pressure log and cuff.

## 2023-08-15 NOTE — Assessment & Plan Note (Signed)
 Lab Results  Component Value Date   HGBA1C 6.7 (H) 05/03/2023   HGBA1C 6.3 (H) 12/08/2022   HGBA1C 6.5 10/05/2022   Lab Results  Component Value Date   MICROALBUR <0.7 02/01/2023   LDLCALC 70 10/05/2022   CREATININE 1.23 (H) 05/03/2023   A1C was stable in December. Update today, continue onglyza and tresiba.

## 2023-08-15 NOTE — Progress Notes (Signed)
 Subjective:     Patient ID: Bailey Greene, female    DOB: [birth year], 63 y.o.   MRN: [id number]  Chief Complaint  Patient presents with   Hypertension    Patient complains of low blood pressure readings at home for the past 2 weeks.     Hypertension    Discussed the use of AI scribe software for clinical note transcription with the patient, who gave verbal consent to proceed.  History of Present Illness  Bailey Greene is a 63 year old female with hypertension who presents with low blood pressure readings and associated symptoms. She was referred by Dr. Jennelle Human for management of anxiety and low blood pressure.  Over the past two weeks, she has experienced low blood pressure readings, with systolic values dropping to as low as 75 mmHg. Her typical blood pressure at home used to be around 118 mmHg, but it has recently been consistently under 80 or 90 mmHg. She experiences mild dizziness and fatigue associated with these low blood pressure readings.  Her current medications include extended-release clonidine, lisinopril 5 mg, and metoprolol XL 25 mg. Clonidine was prescribed to help manage her anxiety, which she describes as being 'all over the place.'  She reports a weight loss of six pounds since December, attributing it to stress and feeling overwhelmed. She is concerned about her thyroid as a potential contributor to her symptoms, as she has been experiencing neck problems.  She is due for follow-up on her A1c and kidney function tests. She has not yet scheduled her mammogram and Pap smear, which were due around March 6. She also plans to schedule an eye exam, as her previous eye doctor has retired. She has not had a colonoscopy before but completed a Cologuard test in March 2022.     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   Qualifier: Diagnosis of  By: Cheri Guppy     Diabetes mellitus type 2 in nonobese (HCC) 07/2016   Dyslipidemia    Headache(784.0)    History of pulmonary embolism 07/08/2020   HTN (hypertension)    Migraine    NSTEMI (non-ST elevated myocardial infarction) (HCC)    PAD (peripheral artery disease) (HCC)    Peripheral arterial disease (HCC)    Pulmonary embolus (HCC)    unprovoked    Past Surgical History:  Procedure Laterality Date   AORTOGRAM N/A 12/08/2022   Procedure: AORTOGRAM;  Surgeon: Leonie Douglas, MD;  [facility];  Service: Vascular;  Laterality: N/A;   BUNIONECTOMY  08/2011   right foot   CARDIAC CATHETERIZATION  2007   severe native 3 v dz, all grafts patent (LIMA-LAD, RIMA-Diag, SVG-OM1-OM2, R radial-PDA)   CESAREAN SECTION  1984   CORONARY ARTERY BYPASS GRAFT  2006    Coronary artery bypass grafting x5 with a left  internal  mammary to the left anterior descending coronary artery.  Free right  internal mammary to the diagonal coronary artery, sequential reverse  saphenous vein graft to the first and second obtuse marginal, right  artery bypass  to the posterior descending coronary artery with endo-vein harvesting.   ENDARTERECTOMY FEMORAL Right 12/08/2022   Procedure: RIGHT COMMON FEMORAL ENDARTERECTOMY;  Surgeon: Leonie Douglas, MD;  [facility];  Service: Vascular;  Laterality: Right;   INSERTION OF ILIAC STENT Bilateral 12/08/2022   Procedure: INSERTION OF BILATERAL ILIAC KISSING STENTS USING VIABAHN VBX STENTS;  Surgeon: Leonie Douglas, MD;  Location: [facility];  Service: Vascular;  Laterality: Bilateral;   LEFT HEART CATH AND CORS/GRAFTS ANGIOGRAPHY N/A 05/05/2021   Procedure: LEFT HEART CATH  AND CORS/GRAFTS ANGIOGRAPHY;  Surgeon: Marykay Lex, MD;  Location[facility] INVASIVE CV LAB;  Service: Cardiovascular;  Laterality: N/A;   ULTRASOUND GUIDANCE FOR VASCULAR ACCESS Left 12/08/2022   Procedure: ULTRASOUND GUIDANCE FOR VASCULAR ACCESS OF LEFT FEMORAL ARTERY;  Surgeon: Leonie Douglas, MD;  Location: [facility];  Service: Vascular;  Laterality: Left;   UMBILICAL HERNIA REPAIR N/A 01/16/2021   Procedure: UMBILICAL  HERNIA REPAIR;  Surgeon: Fritzi Mandes, MD;  Location: [facility] OR;  Service: General;  Laterality: N/A;    Family History  Problem Relation Age of Onset   Lupus Mother    Heart attack Father    Diabetes Father    Hypertension Father    Diabetes Paternal Grandmother    Hypertension Paternal Grandmother    Stroke Neg Hx    Kidney disease Neg Hx    Hyperlipidemia Neg Hx    Sudden death Neg Hx     Social History   Socioeconomic History   Marital status: Legally Separated    Spouse name: Not on file   Number of children: Not on file   Years of education: Not on file   Highest education level: Some college, no degree  Occupational History   Occupation: Armed forces training and education officer  Tobacco Use   Smoking status: Former    Current packs/day: 0.00    Types: Cigarettes    Start date: 09/12/1996    Quit date: 09/12/2021    Years since quitting: 1.9   Smokeless tobacco: Never   Tobacco comments:    0.5 pack cigarettes  per month per patient   Vaping Use   Vaping status: Never Used  Substance and Sexual Activity   Alcohol use: No    Alcohol/week: 0.0 standard drinks of alcohol   Drug use: Never   Sexual activity: Yes    Partners: Male    Comment: married  Other Topics Concern   Not on file  Social History Narrative   Regular exercise:  3 days weeklyCaffeine    Use:  1 soda daily   One child biological daughter born in 53 and an adopted niece.[employer]- Engineer, technical sales   Married- may be divorcing.    Social Drivers of Health   Financial Resource Strain:  Medium Risk (10/05/2022)   Overall Financial Resource Strain (CARDIA)    Difficulty of Paying Living Expenses: Somewhat hard  Food Insecurity: Food Insecurity Present (10/05/2022)   Hunger Vital Sign    Worried About Running Out of Food in the Last Year: Sometimes true    Ran Out of Food in the Last Year: Sometimes true  Transportation Needs: No Transportation Needs (05/03/2023)   PRAPARE - Administrator, Civil Service (Medical): No    Lack of Transportation (Non-Medical): No  Physical Activity: Insufficiently Active (10/05/2022)   Exercise Vital Sign    Days of Exercise per Week: 3 days    Minutes of Exercise per Session: 20 min  Stress:  Stress Concern Present (10/05/2022)   [employer] of Occupational Health - Occupational Stress Questionnaire    Feeling of Stress : Rather much  Social Connections: Unknown (10/05/2022)   Social Connection and Isolation Panel [NHANES]    Frequency of Communication with Friends and Family: Twice a week    Frequency of Social Gatherings with Friends and Family: Once a week    Attends Religious Services: Patient declined    Database administrator or Organizations: No    Attends Engineer, structural: Not on file    Marital Status: Married  Catering manager Violence: Not on file    Outpatient Medications Prior to Visit  Medication Sig Dispense Refill   ACCU-CHEK GUIDE test strip USE UP TO FOUR TIMES DAILY AS DIRECTED 400 strip 6   Accu-Chek Softclix Lancets lancets USE UP TO FOUR TIMES DAILY AS DIRECTED 100 each 12   aspirin 81 MG EC tablet Take 1 tablet (81 mg total) by mouth daily. Swallow whole. 30 tablet 12   atorvastatin (LIPITOR) 80 MG tablet TAKE 1 TABLET BY MOUTH EVERY DAY 90 tablet 3   BD PEN NEEDLE NANO U/F 32G X 4 MM MISC USE AS DIRECTED 300 each 1   blood glucose meter kit and supplies KIT Dispense based on patient and insurance preference. Use up to four times daily as directed. (FOR ICD-9 250.00, 250.01). 1 each 0    busPIRone (BUSPAR) 30 MG tablet Take 30 mg by mouth daily.     Cholecalciferol (VITAMIN D3) 75 MCG (3000 UT) TABS Take 1 tablet by mouth daily at 6 (six) AM. 30 tablet    cilostazol (PLETAL) 100 MG tablet TAKE 1 TABLET BY MOUTH TWICE A DAY BEFORE MEALS 180 tablet 3   clonazePAM (KLONOPIN) 0.5 MG tablet 1 tablet in the morning and 1 or 1 and 1/2 tablets in the evening 75 tablet 1   cloNIDine ER 0.17 MG TB24 Take 1 tablet by mouth every morning. 30 tablet 1   clopidogrel (PLAVIX) 75 MG tablet TAKE 1 TABLET BY MOUTH EVERY DAY WITH BREAKFAST 90 tablet 3   Continuous Glucose Receiver (DEXCOM G6 RECEIVER) DEVI Use as directed 1 each 0   Continuous Glucose Sensor (DEXCOM G6 SENSOR) MISC USE AS DIRECTED 3 each 1   Continuous Glucose Transmitter (DEXCOM G6 TRANSMITTER) MISC USE AS DIRECTED 1 each 1   dapagliflozin propanediol (FARXIGA) 10 MG TABS tablet Take 1 tablet (10 mg total) by mouth daily before breakfast. 90 tablet 1   insulin degludec (TRESIBA FLEXTOUCH) 100 UNIT/ML FlexTouch Pen Inject 7 Units into the skin daily.     isosorbide mononitrate (IMDUR) 120 MG 24 hr tablet TAKE 1 TABLET BY MOUTH EVERY DAY 90 tablet 0   metoprolol succinate (TOPROL-XL) 25 MG 24 hr tablet Take 1 tablet (25 mg total) by mouth daily. 90 tablet 2   mupirocin ointment (BACTROBAN) 2 % Apply 1 application. topically 2 (two) times daily. 30 g 2   nitroGLYCERIN (NITROSTAT) 0.4 MG SL tablet PLACE 1 TABLET UNDER THE TONGUE EVERY 5 MINUTES AS NEEDED FOR CHEST PAIN. MAX 3. CALL 911 25 tablet 3   OLANZapine (ZYPREXA) 20 MG tablet TAKE 1 TABLET BY MOUTH EVERYDAY AT BEDTIME 30 tablet 1   oxyCODONE-acetaminophen (PERCOCET/ROXICET) 5-325 MG tablet Take 1-2 tablets by mouth every 4 (four) hours as needed for moderate pain. 30 tablet 0   PARoxetine (PAXIL) 40 MG tablet Take 1 tablet (40 mg total) by mouth 2 (two) times daily. 180 tablet 0  ranolazine (RANEXA) 500 MG 12 hr tablet TAKE 1 TABLET BY MOUTH TWICE A DAY 180 tablet 3    saxagliptin HCl (ONGLYZA) 2.5 MG TABS tablet Take 1 tablet (2.5 mg total) by mouth daily. 90 tablet 1   XIIDRA 5 % SOLN Place 1 drop into both eyes in the morning and at bedtime.     lisinopril (ZESTRIL) 5 MG tablet TAKE 1 TABLET (5 MG TOTAL) BY MOUTH DAILY. 90 tablet 1   No facility-administered medications prior to visit.    Allergies  Allergen Reactions   Metformin And Related Nausea And Vomiting    ROS    See HPI Objective:    Physical Exam Constitutional:      General: She is not in acute distress.    Appearance: Normal appearance. She is well-developed.  HENT:     Head: Normocephalic and atraumatic.     Right Ear: External ear normal.     Left Ear: External ear normal.  Eyes:     General: No scleral icterus. Neck:     Thyroid: Thyromegaly present.     Comments: Goiter noted Cardiovascular:     Rate and Rhythm: Normal rate and regular rhythm.     Heart sounds: Normal heart sounds. No murmur heard. Pulmonary:     Effort: Pulmonary effort is normal. No respiratory distress.     Breath sounds: Normal breath sounds. No wheezing.  Skin:    General: Skin is warm and dry.  Neurological:     Mental Status: She is alert and oriented to person, place, and time.  Psychiatric:        Mood and Affect: Mood normal.        Behavior: Behavior normal.        Thought Content: Thought content normal.        Judgment: Judgment normal.      BP 116/75 (BP Location: Right Arm, Patient Position: Sitting, Cuff Size: Small)   Pulse 85   Temp 97.9 F (36.6 C) (Oral)   Resp 16   Ht 5\' 4"  (1.626 m)   Wt 125 lb (56.7 kg)   LMP 11/28/2009   SpO2 100%   BMI 21.46 kg/m  Wt Readings from Last 3 Encounters:  08/15/23 125 lb (56.7 kg)  06/07/23 128 lb 8 oz (58.3 kg)  05/03/23 131 lb (59.4 kg)       Assessment & Plan:   Problem List Items Addressed This Visit       Unprioritized   Weight loss - Primary    Weight loss possibly stress-related. Thyroid function needs  evaluation. - Check thyroid function.      Relevant Orders   TSH   T4, free   Essential hypertension   BP low.   Low blood pressure likely due to antihypertensive medications. Clonidine for anxiety and lisinopril for kidney protection complicate management. - Reduce lisinopril dose to 2.5 mg daily. - Consult cardiologist regarding possible metoprolol adjustment. - Schedule follow-up in two weeks with blood pressure log and cuff.      Relevant Medications   lisinopril (ZESTRIL) 2.5 MG tablet   Controlled diabetes mellitus type 2 with complications [facility])   Lab Results  Component Value Date   HGBA1C 6.7 (H) 05/03/2023   HGBA1C 6.3 (H) 12/08/2022   HGBA1C 6.5 10/05/2022   Lab Results  Component Value Date   MICROALBUR <0.7 02/01/2023   LDLCALC 70 10/05/2022   CREATININE 1.23 (H) 05/03/2023   A1C was stable in December. Update today,  continue onglyza and tresiba.       Relevant Medications   lisinopril (ZESTRIL) 2.5 MG tablet   Other Relevant Orders   HgB A1c   Basic Metabolic Panel (BMET)   Ambulatory referral to Ophthalmology   Urine Microalbumin w/creat. ratio   Other Visit Diagnoses       Screening for colon cancer       Relevant Orders   Ambulatory referral to Gastroenterology       General Health Maintenance Due for routine screenings and eye exam referral needed. - Request mammogram and Pap smear reports from [facility]. - Refer to an eye doctor in [place]. - Order colonoscopy.  I have discontinued Arlenne Acocella's lisinopril. I am also having her start on lisinopril. Additionally, I am having her maintain her Xiidra, blood glucose meter kit and supplies, aspirin EC, mupirocin ointment, Accu-Chek Softclix Lancets, BD Pen Needle Nano U/F, Vitamin D3, Accu-Chek Guide, Dexcom G6 Sensor, Dexcom G6 Receiver, ranolazine, busPIRone, clopidogrel, nitroGLYCERIN, oxyCODONE-acetaminophen, atorvastatin, cilostazol, metoprolol succinate, Evaristo Bury FlexTouch,  dapagliflozin propanediol, isosorbide mononitrate, OLANZapine, saxagliptin HCl, Dexcom G6 Transmitter, PARoxetine, clonazePAM, and cloNIDine ER.  Meds ordered this encounter  Medications   lisinopril (ZESTRIL) 2.5 MG tablet    Sig: Take 1 tablet (2.5 mg total) by mouth daily.    Dispense:  90 tablet    Refill:  1    Supervising Provider:   Danise Edge A [[id number]]

## 2023-08-15 NOTE — Assessment & Plan Note (Signed)
  Weight loss possibly stress-related. Thyroid function needs evaluation. - Check thyroid function.

## 2023-08-15 NOTE — Assessment & Plan Note (Addendum)
 BP low.   Low blood pressure likely due to antihypertensive medications. Clonidine for anxiety and lisinopril for kidney protection complicate management. - Reduce lisinopril dose to 2.5 mg daily. - Consult cardiologist regarding possible metoprolol adjustment. - Schedule follow-up in two weeks with blood pressure log and cuff.

## 2023-08-15 NOTE — Patient Instructions (Signed)
 VISIT SUMMARY:  Bailey Greene, a 63 year old female with hypertension, visited today due to low blood pressure readings and associated symptoms such as dizziness and fatigue. She has also experienced weight loss and is concerned about her thyroid. Additionally, she is due for several routine health screenings.  YOUR PLAN:  -HYPOTENSION: Hypotension means low blood pressure, which can cause dizziness and fatigue. Your low blood pressure is likely due to your current medications. We will reduce your lisinopril dose to 2.5 mg daily and consult a cardiologist about adjusting your metoprolol. Please keep a blood pressure log and bring it to your follow-up appointment in two weeks.  -WEIGHT LOSS: Your recent weight loss may be related to stress. We will check your thyroid function to rule out any thyroid issues.  -GENERAL HEALTH MAINTENANCE: You are due for several routine health screenings. We will request your mammogram and Pap smear reports from [facility], refer you to an eye doctor in [place], and order a colonoscopy.  INSTRUCTIONS:  Please schedule a follow-up appointment in two weeks and bring your blood pressure log and cuff. Additionally, schedule your mammogram, Pap smear, and eye exam as soon as possible. We will also check your thyroid function during your next visit.

## 2023-08-18 DIAGNOSIS — N1831 Chronic kidney disease, stage 3a: Secondary | ICD-10-CM | POA: Insufficient documentation

## 2023-08-20 ENCOUNTER — Other Ambulatory Visit: Payer: Self-pay | Admitting: Psychiatry

## 2023-08-20 DIAGNOSIS — F4001 Agoraphobia with panic disorder: Secondary | ICD-10-CM

## 2023-08-20 DIAGNOSIS — F411 Generalized anxiety disorder: Secondary | ICD-10-CM

## 2023-08-20 DIAGNOSIS — F5105 Insomnia due to other mental disorder: Secondary | ICD-10-CM

## 2023-08-28 ENCOUNTER — Other Ambulatory Visit: Payer: Self-pay | Admitting: Cardiology

## 2023-08-30 ENCOUNTER — Ambulatory Visit: Admitting: Family

## 2023-08-30 ENCOUNTER — Other Ambulatory Visit: Payer: Self-pay | Admitting: Psychiatry

## 2023-08-30 VITALS — BP 81/50 | HR 83 | Temp 97.7°F | Resp 16 | Ht 64.0 in | Wt 133.0 lb

## 2023-08-30 DIAGNOSIS — F411 Generalized anxiety disorder: Secondary | ICD-10-CM

## 2023-08-30 DIAGNOSIS — F4001 Agoraphobia with panic disorder: Secondary | ICD-10-CM

## 2023-08-30 DIAGNOSIS — F319 Bipolar disorder, unspecified: Secondary | ICD-10-CM | POA: Diagnosis not present

## 2023-08-30 DIAGNOSIS — Z794 Long term (current) use of insulin: Secondary | ICD-10-CM

## 2023-08-30 DIAGNOSIS — E118 Type 2 diabetes mellitus with unspecified complications: Secondary | ICD-10-CM | POA: Diagnosis not present

## 2023-08-30 DIAGNOSIS — I2510 Atherosclerotic heart disease of native coronary artery without angina pectoris: Secondary | ICD-10-CM | POA: Diagnosis not present

## 2023-08-30 NOTE — Assessment & Plan Note (Signed)
 Lab Results  Component Value Date   HGBA1C 6.8 (H) 08/15/2023   HGBA1C 6.7 (H) 05/03/2023   HGBA1C 6.3 (H) 12/08/2022   Lab Results  Component Value Date   MICROALBUR <0.7 08/15/2023   LDLCALC 70 10/05/2022   CREATININE 1.22 (H) 08/15/2023   A1C at goal, on tresiba, onglyza, farxiga.

## 2023-08-30 NOTE — Assessment & Plan Note (Addendum)
 On imdur, pletal, plavix, beta blocker. Advised pt to schedule routine follow up with cardiology.

## 2023-08-30 NOTE — Progress Notes (Signed)
 Subjective:     Patient ID: Bailey Greene, female    DOB: 1961/03/13, 63 y.o.   MRN: [id number]  Chief Complaint  Patient presents with   Weight Loss    Here for follow up   Hypertension    Patient has been getting low readings at home 75/ 50 on average.    Hypertension    Discussed the use of AI scribe software for clinical note transcription with the patient, who gave verbal consent to proceed.  History of Present Illness  Bailey Greene is a 63 year old female who presents with dizziness and fatigue.  She experiences persistent hypotension with blood pressure readings as low as 72/48 mmHg, and on one occasion, as low as 64 mmHg. Symptoms include dizziness and lightheadedness, particularly in the mornings, affecting her balance and ability to function. She feels 'dizzy and tired' and notes that her blood pressure has been consistently low, causing her to feel like she might pass out.  Her current medication regimen includes clonidine, recently changed to an extended-release formulation, at the lowest dose of 0.17 mg. She also takes metoprolol, the smallest dose possible, primarily for heart reasons, and lisinopril 2.5 mg for kidney protection. Her diabetes is managed with [place], with a dosage of 7 units of insulin. Her recent A1c was 6.8%, indicating good control of her blood sugar levels. There is no indication of any recent changes to her diabetes management plan.  She reports a slight improvement in her appetite, noting that she had been losing weight rapidly but has recently gained a few pounds. She mentions a change in one of her medications but cannot recall the name.  No recent changes in her medication regimen aside from the clonidine adjustment.  BP Readings from Last 3 Encounters:  08/30/23 (!) 81/50  08/15/23 116/75  06/07/23 121/75   Wt Readings from Last 3 Encounters:  08/30/23 133 lb (60.3 kg)  08/15/23 125 lb (56.7 kg)  06/07/23 128 lb 8  oz (58.3 kg)       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   Qualifier: Diagnosis of  By: Cheri Guppy     Diabetes mellitus type 2 in nonobese (HCC) 07/2016   Dyslipidemia    Headache(784.0)    History of pulmonary embolism 07/08/2020   HTN (hypertension)    Migraine    NSTEMI (non-ST elevated myocardial infarction) (HCC)    PAD (peripheral artery disease) (HCC)    Peripheral arterial disease (HCC)    Pulmonary embolus (HCC)    unprovoked    Past Surgical History:  Procedure Laterality Date   AORTOGRAM N/A 12/08/2022   Procedure: AORTOGRAM;  Surgeon: Leonie Douglas, MD;  Location: [facility] OR;  Service: Vascular;  Laterality: N/A;   BUNIONECTOMY  08/2011   right foot   CARDIAC CATHETERIZATION  2007   severe native 3 v dz, all grafts patent (LIMA-LAD, RIMA-Diag, SVG-OM1-OM2, R radial-PDA)   CESAREAN SECTION  1984   CORONARY ARTERY BYPASS GRAFT  2006    Coronary artery bypass grafting x5  with a left  internal  mammary to the left anterior descending coronary artery.  Free right  internal mammary to the diagonal coronary artery, sequential reverse  saphenous vein graft to the first and second obtuse marginal, right  artery bypass to the posterior descending coronary artery with endo-vein harvesting.   ENDARTERECTOMY FEMORAL Right 12/08/2022   Procedure: RIGHT COMMON FEMORAL ENDARTERECTOMY;  Surgeon: Leonie Douglas, MD;  Location: [facility] OR;  Service: Vascular;  Laterality: Right;   INSERTION OF ILIAC STENT Bilateral 12/08/2022   Procedure: INSERTION OF BILATERAL ILIAC KISSING  STENTS USING VIABAHN VBX STENTS;  Surgeon: Leonie Douglas, MD;  Location: [facility] OR;  Service: Vascular;  Laterality: Bilateral;   LEFT HEART CATH AND CORS/GRAFTS ANGIOGRAPHY N/A 05/05/2021   Procedure: LEFT HEART CATH AND CORS/GRAFTS ANGIOGRAPHY;  Surgeon: Marykay Lex, MD;  Location: [facility] INVASIVE CV LAB;  Service: Cardiovascular;  Laterality: N/A;   ULTRASOUND GUIDANCE FOR VASCULAR ACCESS Left 12/08/2022   Procedure: ULTRASOUND GUIDANCE FOR VASCULAR ACCESS OF LEFT FEMORAL ARTERY;  Surgeon: Leonie Douglas, MD;  Location: [facility] OR;  Service: Vascular;  Laterality: Left;   UMBILICAL HERNIA REPAIR N/A 01/16/2021   Procedure: UMBILICAL  HERNIA REPAIR;  Surgeon: Fritzi Mandes, MD;  Location: [facility] OR;  Service: General;  Laterality: N/A;    Family History  Problem Relation Age of Onset   Lupus Mother    Heart attack Father    Diabetes Father    Hypertension Father    Diabetes Paternal Grandmother    Hypertension Paternal Grandmother    Stroke Neg Hx    Kidney disease Neg Hx    Hyperlipidemia Neg Hx    Sudden death Neg Hx     Social History   Socioeconomic History   Marital status: Legally Separated    Spouse name: Not on file   Number of children: Not on file   Years of education: Not on file   Highest education level: Some college, no degree  Occupational History   Occupation: Armed forces training and education officer  Tobacco Use   Smoking status: Former    Current packs/day: 0.00    Types: Cigarettes    Start date: 09/12/1996    Quit date: 09/12/2021    Years since quitting: 1.9   Smokeless tobacco: Never   Tobacco comments:    0.5 pack cigarettes  per month per patient   Vaping Use   Vaping status: Never Used  Substance and Sexual Activity   Alcohol use: No    Alcohol/week: 0.0 standard drinks of alcohol   Drug use: Never   Sexual activity: Yes    Partners: Male    Comment: married  Other Topics Concern   Not on file  Social History Narrative   Regular exercise:  3 days weeklyCaffeine     Use:  1 soda daily   One child biological daughter born in 63 and an adopted niece.[employer]- Engineer, technical sales   Married- may be divorcing.    Social Drivers of Health   Financial Resource Strain: Medium Risk (10/05/2022)   Overall Financial Resource Strain (CARDIA)    Difficulty of Paying Living Expenses: Somewhat hard  Food Insecurity: Food Insecurity Present (10/05/2022)   Hunger Vital Sign    Worried About Running Out of Food in the Last Year: Sometimes true    Ran Out of Food in the Last Year: Sometimes true  Transportation Needs: No Transportation Needs (05/03/2023)   PRAPARE - Administrator, Civil Service (Medical):  No    Lack of Transportation (Non-Medical): No  Physical Activity: Insufficiently Active (10/05/2022)   Exercise Vital Sign    Days of Exercise per Week: 3 days    Minutes of Exercise per Session: 20 min  Stress: Stress Concern Present (10/05/2022)   [employer] of Occupational Health - Occupational Stress Questionnaire    Feeling of Stress : Rather much  Social Connections: Unknown (10/05/2022)   Social Connection and Isolation Panel [NHANES]    Frequency of Communication with Friends and Family: Twice a week    Frequency of Social Gatherings with Friends and Family: Once a week    Attends Religious Services: Patient declined    Database administrator or Organizations: No    Attends Engineer, structural: Not on file    Marital Status: Married  Catering manager Violence: Not on file    Outpatient Medications Prior to Visit  Medication Sig Dispense Refill   ACCU-CHEK GUIDE test strip USE UP TO FOUR TIMES DAILY AS DIRECTED 400 strip 6   Accu-Chek Softclix Lancets lancets USE UP TO FOUR TIMES DAILY AS DIRECTED 100 each 12   aspirin 81 MG EC tablet Take 1 tablet (81 mg total) by mouth daily. Swallow whole. 30 tablet 12   atorvastatin (LIPITOR) 80 MG tablet TAKE 1 TABLET BY MOUTH EVERY DAY 90 tablet 3   BD PEN NEEDLE NANO U/F 32G X  4 MM MISC USE AS DIRECTED 300 each 1   blood glucose meter kit and supplies KIT Dispense based on patient and insurance preference. Use up to four times daily as directed. (FOR ICD-9 250.00, 250.01). 1 each 0   busPIRone (BUSPAR) 30 MG tablet Take 30 mg by mouth daily.     Cholecalciferol (VITAMIN D3) 75 MCG (3000 UT) TABS Take 1 tablet by mouth daily at 6 (six) AM. 30 tablet    cilostazol (PLETAL) 100 MG tablet TAKE 1 TABLET BY MOUTH TWICE A DAY BEFORE MEALS 180 tablet 3   clonazePAM (KLONOPIN) 0.5 MG tablet 1 tablet in the morning and 1 or 1 and 1/2 tablets in the evening 75 tablet 1   cloNIDine ER 0.17 MG TB24 Take 1 tablet by mouth every morning. 30 tablet 1   clopidogrel (PLAVIX) 75 MG tablet TAKE 1 TABLET BY MOUTH EVERY DAY WITH BREAKFAST 90 tablet 3   Continuous Glucose Receiver (DEXCOM G6 RECEIVER) DEVI Use as directed 1 each 0   Continuous Glucose Sensor (DEXCOM G6 SENSOR) MISC USE AS DIRECTED 3 each 1   Continuous Glucose Transmitter (DEXCOM G6 TRANSMITTER) MISC USE AS DIRECTED 1 each 1   dapagliflozin propanediol (FARXIGA) 10 MG TABS tablet Take 1 tablet (10 mg total) by mouth daily before breakfast. 90 tablet 1   insulin degludec (TRESIBA FLEXTOUCH) 100 UNIT/ML FlexTouch Pen Inject 7 Units into the skin daily.     isosorbide mononitrate (IMDUR) 120 MG 24 hr tablet TAKE 1 TABLET BY MOUTH EVERY DAY 90 tablet 1   lisinopril (ZESTRIL) 2.5 MG tablet Take 1 tablet (2.5 mg total) by mouth daily. 90 tablet 1   metoprolol succinate (TOPROL-XL) 25 MG 24 hr tablet Take 0.5 tablets (12.5 mg total) by mouth daily.     mupirocin ointment (BACTROBAN) 2 % Apply 1 application. topically 2 (two) times daily. 30 g 2   nitroGLYCERIN (NITROSTAT) 0.4 MG SL tablet PLACE 1 TABLET UNDER THE TONGUE EVERY 5 MINUTES AS NEEDED FOR CHEST PAIN. MAX 3. CALL 911 25 tablet 3   OLANZapine (ZYPREXA)  20 MG tablet TAKE 1 TABLET BY MOUTH EVERYDAY AT BEDTIME 30 tablet 1   oxyCODONE-acetaminophen (PERCOCET/ROXICET) 5-325 MG  tablet Take 1-2 tablets by mouth every 4 (four) hours as needed for moderate pain. 30 tablet 0   PARoxetine (PAXIL) 40 MG tablet Take 1 tablet (40 mg total) by mouth 2 (two) times daily. 180 tablet 0   ranolazine (RANEXA) 500 MG 12 hr tablet TAKE 1 TABLET BY MOUTH TWICE A DAY 180 tablet 3   saxagliptin HCl (ONGLYZA) 2.5 MG TABS tablet Take 1 tablet (2.5 mg total) by mouth daily. 90 tablet 1   XIIDRA 5 % SOLN Place 1 drop into both eyes in the morning and at bedtime.     No facility-administered medications prior to visit.    Allergies  Allergen Reactions   Metformin And Related Nausea And Vomiting    ROS See HPI    Objective:    Physical Exam Constitutional:      General: She is not in acute distress.    Appearance: Normal appearance. She is well-developed.  HENT:     Head: Normocephalic and atraumatic.     Right Ear: External ear normal.     Left Ear: External ear normal.  Eyes:     General: No scleral icterus. Neck:     Thyroid: No thyromegaly.  Cardiovascular:     Rate and Rhythm: Normal rate and regular rhythm.     Heart sounds: Normal heart sounds. No murmur heard. Pulmonary:     Effort: Pulmonary effort is normal. No respiratory distress.     Breath sounds: Normal breath sounds. No wheezing.  Musculoskeletal:     Cervical back: Neck supple.  Skin:    General: Skin is warm and dry.  Neurological:     Mental Status: She is alert and oriented to person, place, and time.  Psychiatric:        Mood and Affect: Mood normal.        Behavior: Behavior normal.        Thought Content: Thought content normal.        Judgment: Judgment normal.      BP (!) 81/50 (BP Location: Left Arm, Patient Position: Sitting, Cuff Size: Small)   Pulse 83   Temp 97.7 F (36.5 C) (Oral)   Resp 16   Ht 5\' 4"  (1.626 m)   Wt 133 lb (60.3 kg)   LMP 11/28/2009   SpO2 99%   BMI 22.83 kg/m  Wt Readings from Last 3 Encounters:  08/30/23 133 lb (60.3 kg)  08/15/23 125 lb (56.7 kg)   06/07/23 128 lb 8 oz (58.3 kg)       Assessment & Plan:   Problem List Items Addressed This Visit       Unprioritized   Controlled diabetes mellitus type 2 with complications (HCC) - Primary   Lab Results  Component Value Date   HGBA1C 6.8 (H) 08/15/2023   HGBA1C 6.7 (H) 05/03/2023   HGBA1C 6.3 (H) 12/08/2022   Lab Results  Component Value Date   MICROALBUR <0.7 08/15/2023   LDLCALC 70 10/05/2022   CREATININE 1.22 (H) 08/15/2023   A1C at goal, on tresiba, onglyza, farxiga.       CAD (coronary artery disease)   On imdur, pletal, plavix, beta blocker. Advised pt to schedule routine follow up with cardiology.       Bipolar affective disorder (HCC)   Bipolar appears well controlled. Follows with psychiatry.      Anxiety state  Chronic hypotension with symptomatic episodes of dizziness and fatigue. Blood pressure readings as low as 72/48 mmHg, with a dangerously low reading of 64 mmHg reported. Symptoms include dizziness, lightheadedness, and loss of balance, particularly in the morning. The hypotension is likely exacerbated by clonidine, which is prescribed for anxiety management. Clonidine provides significant relief for anxiety but poses a risk of dangerously low blood pressure, increasing the risk of falls and injury. Other medications, including metoprolol and lisinopril, are at minimal doses and are necessary for cardiovascular and renal protection, respectively. Discontinuation of clonidine is advised to prevent further hypotensive episodes. - Discuss clonidine discontinuation or alternative anxiety management with psychiatrist - Sent note to psychiatrist expressing concerns about hypotension and clonidine use - Monitor blood pressure regularly       I am having Genessis Flanary maintain her Benay Spice, blood glucose meter kit and supplies, aspirin EC, mupirocin ointment, Accu-Chek Softclix Lancets, BD Pen Needle Nano U/F, Vitamin D3, Accu-Chek Guide, Dexcom G6 Sensor,  Dexcom G6 Receiver, ranolazine, busPIRone, clopidogrel, nitroGLYCERIN, oxyCODONE-acetaminophen, atorvastatin, cilostazol, Evaristo Bury FlexTouch, dapagliflozin propanediol, OLANZapine, saxagliptin HCl, Dexcom G6 Transmitter, PARoxetine, clonazePAM, cloNIDine ER, lisinopril, metoprolol succinate, and isosorbide mononitrate.  No orders of the defined types were placed in this encounter.

## 2023-08-30 NOTE — Progress Notes (Cosign Needed Addendum)
   Established Patient Office Visit  Subjective   Patient ID: Bailey Greene, female    DOB: 01/07/1961  Age: 63 y.o. MRN: [id number]  Chief Complaint  Patient presents with   Weight Loss    Here for follow up    Pleasant 63 yo patient returned to office for recheck of hypotension and weight loss. Patient continues to experience hypotension with fatigue and dizziness. Denies syncope or diaphoresis. Continued dizziness and fatigue. States she is eating and drinking well.   Hypertension   ROS See HPI   Objective:     BP (!) 81/50 (BP Location: Left Arm, Patient Position: Sitting, Cuff Size: Small)   Pulse 83   Temp 97.7 F (36.5 C) (Oral)   Resp 16   Ht 5\' 4"  (1.626 m)   Wt 133 lb (60.3 kg)   LMP 11/28/2009   SpO2 99%   BMI 22.83 kg/m    Physical Exam Vitals reviewed.  Constitutional:      Appearance: Normal appearance.  HENT:     Head: Normocephalic and atraumatic.     Right Ear: External ear normal.     Left Ear: External ear normal.  Cardiovascular:     Rate and Rhythm: Normal rate and regular rhythm.     Pulses: Normal pulses.     Heart sounds: Normal heart sounds.  Pulmonary:     Effort: Pulmonary effort is normal.     Breath sounds: Normal breath sounds.  Skin:    General: Skin is warm and dry.     Capillary Refill: Capillary refill takes less than 2 seconds.  Neurological:     Mental Status: She is alert and oriented to person, place, and time.  Psychiatric:        Mood and Affect: Mood normal.        Behavior: Behavior normal.      Assessment & Plan:   -Hypertension - continued symptomatic HYPOtension possibly related to recent changes in clonidine. Due to patient's complicated medical history including HTN, CAD, PAD, DM, and anxiety, she requires careful medication management. Consulted her psychiatrist to determine if alternative options are available to treat patient's anxiety. She will return to the office for BP follow-up in 2 weeks.    -Weight loss - weight up today. Reports eating and drinking well.   -Coronary artery disease - patient to follow up with cardiology. Currently taking Pletal, Plavix, Imdur, and Toprol-XL  Cristopher Peru, RN

## 2023-08-30 NOTE — Assessment & Plan Note (Signed)
>>  ASSESSMENT AND PLAN FOR CAD (CORONARY ARTERY DISEASE) WRITTEN ON 08/30/2023  3:58 PM BY O'SULLIVAN, Loghan Subia, NP  On imdur , pletal , plavix , beta blocker. Advised pt to schedule routine follow up with cardiology.

## 2023-08-30 NOTE — Assessment & Plan Note (Signed)
 Bipolar appears well controlled. Follows with psychiatry.

## 2023-08-30 NOTE — Assessment & Plan Note (Signed)
 Chronic hypotension with symptomatic episodes of dizziness and fatigue. Blood pressure readings as low as 72/48 mmHg, with a dangerously low reading of 64 mmHg reported. Symptoms include dizziness, lightheadedness, and loss of balance, particularly in the morning. The hypotension is likely exacerbated by clonidine, which is prescribed for anxiety management. Clonidine provides significant relief for anxiety but poses a risk of dangerously low blood pressure, increasing the risk of falls and injury. Other medications, including metoprolol and lisinopril, are at minimal doses and are necessary for cardiovascular and renal protection, respectively. Discontinuation of clonidine is advised to prevent further hypotensive episodes. - Discuss clonidine discontinuation or alternative anxiety management with psychiatrist - Sent note to psychiatrist expressing concerns about hypotension and clonidine use - Monitor blood pressure regularly

## 2023-08-30 NOTE — Patient Instructions (Signed)
 VISIT SUMMARY:  Today, we discussed your symptoms of dizziness and fatigue, which are related to your low blood pressure. We also reviewed your diabetes management and noted that your blood sugar levels are well controlled.  YOUR PLAN:  -HYPOTENSION: Hypotension means low blood pressure, which can cause dizziness, lightheadedness, and fatigue. Your blood pressure has been very low, which is likely made worse by your medication clonidine. We recommend stopping clonidine and will discuss alternative anxiety treatments with your psychiatrist. Please monitor your blood pressure regularly.  -TYPE 2 DIABETES MELLITUS: Type 2 diabetes is a condition where your body does not use insulin properly, leading to high blood sugar levels. Your recent A1c of 6.8% shows good control of your blood sugar. Continue taking your current diabetes medications, Farxiga and Tresiba, and keep monitoring your blood glucose levels regularly.  INSTRUCTIONS:  Please follow up with your psychiatrist to discuss discontinuing clonidine and finding alternative treatments for anxiety. Continue to monitor your blood pressure and blood glucose levels regularly.

## 2023-08-31 ENCOUNTER — Telehealth: Payer: Self-pay | Admitting: Family

## 2023-08-31 NOTE — Telephone Encounter (Signed)
 Please contact pt and let her know that I spoke with Dr. Jennelle Human.  I would like her to try stopping the lisinopril to see if this helps at all with her blood pressure.

## 2023-08-31 NOTE — Telephone Encounter (Signed)
 Called patient but no answer, left voice mail for patient to call back.

## 2023-08-31 NOTE — Telephone Encounter (Signed)
Changed to ER

## 2023-08-31 NOTE — Telephone Encounter (Signed)
-----   Message from Lauraine Rinne sent at 08/30/2023  5:46 PM EDT ----- Regarding: low BP Thank you. This is unfortunate. I'm sure you are right that clonidine is the culprit in combo with the other antihypertensives.  She has taken multiple SSRIs, and fairly high dose BZ and even antipsychotics like olanzapine for panic.  At this point she's been on 11 different meds.  Anxiety  has remained a problem to the point that she has repeated bouts of disability that cause her to miss work for weeks at a time.  She reports the most effective med for her anxiety and panic has been clonidine.  If she stops it she is likely to have to go on long-term disability.   She is wanting to work another year or so to maximize her retirement.  My only other option is an MAOI like Parnate and they are clearly more dangerous meds.   Is it possible for her to stop one of the antihypertensives in the hopes she can stay on clonidine? If not I can try reducing the clonidine and if she ends up on disability, we did the best we could do.  I'm also open to a second psychiatric opinion, of course, but I don't know of anything else that is likely to work as well. Thank you again,  Meredith Staggers ----- Message ----- From: Sandford Craze, NP Sent: 08/30/2023   3:47 PM EDT To: Lauraine Rinne., MD  Hi Dr. Jennelle Human,  I wanted to reach back out to you about Ms. Peter's blood pressure. She is complaining of dizziness, light headedness and fatigue. She is hypotensive with sbp's as low as 72.  She is on the smallest dose of metoprolol for cardiovascular reasons and smallest dose of the lisinopril for renal protection due to proteinuria.  I really think that the clonidine is the culprit and I am afraid that she may fall.  She is open to alternatives.  Is there something else that can be tried please?  Thank you,  Efraim Kaufmann

## 2023-09-01 ENCOUNTER — Telehealth: Payer: Self-pay | Admitting: Family

## 2023-09-01 NOTE — Telephone Encounter (Signed)
 Lets bring her in for follow up visit with me in 2 weeks for blood pressure please.

## 2023-09-01 NOTE — Telephone Encounter (Signed)
-----   Message from Lauraine Rinne sent at 08/31/2023  4:05 PM EDT ----- Regarding: RE: low BP Thank you!  If she is still having symptomatic hypotension then we can try reducing the clonidine (as opposed to stopping it) and do the best we can to keep her on some of it.  If she contacts you about it again feel free to do so. Iona Hansen ----- Message ----- From: Sandford Craze, NP Sent: 08/31/2023   8:06 AM EDT To: Lauraine Rinne., MD Subject: RE: low BP                                     Hello,  Thank you for your thoughtful response.  I will try stopping her lisinopril which she is on for renal protection as she is also on farxiga which offers some renal protection.  However, she is only on 2.5mg  so I am not sure how much this is going to help but worth a try.   Thanks,  Vaunda Gutterman ----- Message ----- From: Lauraine Rinne., MD Sent: 08/30/2023   6:00 PM EDT To: Sandford Craze, NP Subject: low BP                                         Thank you. This is unfortunate. I'm sure you are right that clonidine is the culprit in combo with the other antihypertensives.  She has taken multiple SSRIs, and fairly high dose BZ and even antipsychotics like olanzapine for panic.  At this point she's been on 11 different meds.  Anxiety  has remained a problem to the point that she has repeated bouts of disability that cause her to miss work for weeks at a time.  She reports the most effective med for her anxiety and panic has been clonidine.  If she stops it she is likely to have to go on long-term disability.   She is wanting to work another year or so to maximize her retirement.  My only other option is an MAOI like Parnate and they are clearly more dangerous meds.   Is it possible for her to stop one of the antihypertensives in the hopes she can stay on clonidine? If not I can try reducing the clonidine and if she ends up on disability, we did the best we could do.  I'm also open to a second  psychiatric opinion, of course, but I don't know of anything else that is likely to work as well. Thank you again,  Meredith Staggers ----- Message ----- From: Sandford Craze, NP Sent: 08/30/2023   3:47 PM EDT To: Lauraine Rinne., MD  Hi Dr. Jennelle Human,  I wanted to reach back out to you about Ms. Peter's blood pressure. She is complaining of dizziness, light headedness and fatigue. She is hypotensive with sbp's as low as 72.  She is on the smallest dose of metoprolol for cardiovascular reasons and smallest dose of the lisinopril for renal protection due to proteinuria.  I really think that the clonidine is the culprit and I am afraid that she may fall.  She is open to alternatives.  Is there something else that can be tried please?  Thank you,  Efraim Kaufmann

## 2023-09-01 NOTE — Telephone Encounter (Signed)
 Patient advised of this information and instructions.  She will send a report with her readings next week.

## 2023-09-01 NOTE — Telephone Encounter (Signed)
 Patient scheduled to return 09/14/23

## 2023-09-12 ENCOUNTER — Other Ambulatory Visit: Payer: Self-pay | Admitting: Psychiatry

## 2023-09-12 DIAGNOSIS — F411 Generalized anxiety disorder: Secondary | ICD-10-CM

## 2023-09-12 DIAGNOSIS — F3162 Bipolar disorder, current episode mixed, moderate: Secondary | ICD-10-CM

## 2023-09-12 DIAGNOSIS — F4001 Agoraphobia with panic disorder: Secondary | ICD-10-CM

## 2023-09-14 ENCOUNTER — Ambulatory Visit: Admitting: Family

## 2023-09-14 VITALS — BP 116/74 | HR 85 | Temp 98.5°F | Resp 16 | Ht 64.0 in | Wt 126.0 lb

## 2023-09-14 DIAGNOSIS — F411 Generalized anxiety disorder: Secondary | ICD-10-CM

## 2023-09-14 DIAGNOSIS — I952 Hypotension due to drugs: Secondary | ICD-10-CM | POA: Diagnosis not present

## 2023-09-14 NOTE — Assessment & Plan Note (Signed)
 Improved with discontinuation of lisinopril.  We did perform orthostatic BP checks and she did not exhibit any significant orthostasis. Monitor.

## 2023-09-14 NOTE — Progress Notes (Signed)
 Subjective:     Patient ID: Bailey Greene, female    DOB: Aug 06, 1960, 63 y.o.   MRN: [id number]  Chief Complaint  Patient presents with   Hypotension    Here for follow up, discontinued lisinopril    HPI  Discussed the use of AI scribe software for clinical note transcription with the patient, who gave verbal consent to proceed.  History of Present Illness  Patient is a 63 yr old female who presents today for follow up. Last visit she presented with symptomatic hypotension in the setting of multiple antihypertensives including clonidine which is being used for her anxiety.  She was on a small dose lisinopril for renal protection which was discontinued. Since that time, she has seen improvement in her home blood pressure readings with most SBP's in the low 100's.     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   Qualifier: Diagnosis of  By: Cheri Guppy     Diabetes mellitus type 2 in nonobese (HCC) 07/2016   Dyslipidemia    Headache(784.0)    History of pulmonary embolism 07/08/2020   HTN (hypertension)    Migraine    NSTEMI (non-ST elevated myocardial infarction) (HCC)    PAD (peripheral artery disease) (HCC)    Peripheral arterial disease (HCC)    Pulmonary embolus (HCC)    unprovoked    Past Surgical History:  Procedure Laterality Date   AORTOGRAM N/A 12/08/2022   Procedure: AORTOGRAM;  Surgeon: Leonie Douglas, MD;  Location: [facility] OR;  Service: Vascular;  Laterality: N/A;   BUNIONECTOMY  08/2011   right foot   CARDIAC CATHETERIZATION  2007   severe native 3 v  dz, all grafts patent (LIMA-LAD, RIMA-Diag, SVG-OM1-OM2, R radial-PDA)   CESAREAN SECTION  1984   CORONARY ARTERY BYPASS GRAFT  2006    Coronary artery bypass grafting x5 with a left  internal  mammary to the left anterior descending coronary artery.  Free right  internal mammary to the diagonal coronary artery, sequential reverse  saphenous vein graft to the first and second obtuse marginal, right  artery bypass to the posterior descending coronary artery with endo-vein harvesting.   ENDARTERECTOMY FEMORAL Right 12/08/2022   Procedure: RIGHT COMMON FEMORAL ENDARTERECTOMY;  Surgeon: Leonie Douglas, MD;  Location: [facility] OR;  Service: Vascular;  Laterality: Right;   INSERTION OF ILIAC STENT Bilateral 12/08/2022   Procedure: INSERTION OF BILATERAL ILIAC KISSING STENTS USING VIABAHN VBX STENTS;  Surgeon: Leonie Douglas, MD;  Location: [facility] OR;  Service: Vascular;  Laterality: Bilateral;   LEFT HEART CATH AND CORS/GRAFTS ANGIOGRAPHY N/A 05/05/2021   Procedure: LEFT HEART CATH AND CORS/GRAFTS ANGIOGRAPHY;  Surgeon: Marykay Lex, MD;  Location: [facility] INVASIVE CV LAB;  Service: Cardiovascular;  Laterality: N/A;   ULTRASOUND GUIDANCE FOR VASCULAR ACCESS Left 12/08/2022   Procedure: ULTRASOUND GUIDANCE FOR VASCULAR ACCESS OF LEFT FEMORAL ARTERY;  Surgeon: Leonie Douglas, MD;  Location: [facility] OR;  Service: Vascular;  Laterality: Left;   UMBILICAL HERNIA REPAIR N/A 01/16/2021   Procedure: UMBILICAL  HERNIA REPAIR;  Surgeon: Lujean Sake, MD;  Location: [facility] OR;  Service: General;  Laterality: N/A;    Family History  Problem Relation Age of Onset   Lupus Mother    Heart attack Father    Diabetes Father    Hypertension Father    Diabetes Paternal Grandmother    Hypertension Paternal Grandmother    Stroke Neg Hx    Kidney disease Neg Hx    Hyperlipidemia Neg Hx    Sudden death Neg Hx     Social History   Socioeconomic History   Marital status: Legally Separated    Spouse name: Not on file   Number of  children: Not on file   Years of education: Not on file   Highest education level: Some college, no degree  Occupational History   Occupation: Armed forces training and education officer  Tobacco Use   Smoking status: Former    Current packs/day: 0.00    Types: Cigarettes    Start date: 09/12/1996    Quit date: 09/12/2021    Years since quitting: 2.0   Smokeless tobacco: Never   Tobacco comments:    0.5 pack cigarettes  per month per patient   Vaping Use   Vaping status: Never Used  Substance and Sexual Activity   Alcohol use: No    Alcohol/week: 0.0 standard drinks of alcohol   Drug use: Never   Sexual activity: Yes    Partners: Male    Comment: married  Other Topics Concern   Not on file  Social History Narrative   Regular exercise:  3 days weeklyCaffeine    Use:  1 soda daily   One child biological daughter born in 48 and an adopted niece.[employer]- Engineer, technical sales   Married- may be divorcing.    Social Drivers of Health   Financial Resource Strain: Medium Risk (10/05/2022)   Overall Financial Resource Strain (CARDIA)    Difficulty of Paying Living Expenses: Somewhat hard  Food Insecurity: Food Insecurity Present (10/05/2022)   Hunger Vital Sign    Worried About Running Out of Food in the Last Year: Sometimes true    Ran Out of Food in the Last Year: Sometimes true  Transportation Needs: No Transportation Needs (05/03/2023)   PRAPARE - Administrator, Civil Service (Medical): No    Lack of Transportation (Non-Medical): No  Physical Activity: Insufficiently Active (10/05/2022)   Exercise Vital Sign    Days of Exercise per Week: 3 days    Minutes of Exercise per Session: 20 min  Stress: Stress Concern Present (10/05/2022)   [employer] of Occupational Health - Occupational Stress Questionnaire    Feeling of Stress : Rather much  Social Connections: Unknown (10/05/2022)   Social Connection and Isolation Panel [NHANES]    Frequency of Communication with Friends and  Family: Twice a week    Frequency of Social Gatherings with Friends and Family: Once a week    Attends Religious Services: Patient declined    Database administrator or Organizations: No    Attends Engineer, structural: Not on file    Marital Status: Married  Catering manager Violence: Not on file    Outpatient Medications Prior to Visit  Medication Sig Dispense Refill   ACCU-CHEK GUIDE test strip USE UP TO FOUR TIMES DAILY AS DIRECTED 400 strip 6   Accu-Chek Softclix Lancets lancets USE UP TO FOUR TIMES DAILY AS DIRECTED 100 each 12   aspirin 81 MG EC tablet Take 1 tablet (81  mg total) by mouth daily. Swallow whole. 30 tablet 12   atorvastatin (LIPITOR) 80 MG tablet TAKE 1 TABLET BY MOUTH EVERY DAY 90 tablet 3   BD PEN NEEDLE NANO U/F 32G X 4 MM MISC USE AS DIRECTED 300 each 1   blood glucose meter kit and supplies KIT Dispense based on patient and insurance preference. Use up to four times daily as directed. (FOR ICD-9 250.00, 250.01). 1 each 0   busPIRone (BUSPAR) 30 MG tablet Take 30 mg by mouth daily.     Cholecalciferol (VITAMIN D3) 75 MCG (3000 UT) TABS Take 1 tablet by mouth daily at 6 (six) AM. 30 tablet    cilostazol (PLETAL) 100 MG tablet TAKE 1 TABLET BY MOUTH TWICE A DAY BEFORE MEALS 180 tablet 3   clonazePAM (KLONOPIN) 0.5 MG tablet 1 tablet in the morning and 1 or 1 and 1/2 tablets in the evening 75 tablet 1   cloNIDine ER 0.17 MG TB24 TAKE 1 TABLET BY MOUTH EVERY DAY IN THE MORNING 90 tablet 0   clopidogrel (PLAVIX) 75 MG tablet TAKE 1 TABLET BY MOUTH EVERY DAY WITH BREAKFAST 90 tablet 3   Continuous Glucose Receiver (DEXCOM G6 RECEIVER) DEVI Use as directed 1 each 0   Continuous Glucose Sensor (DEXCOM G6 SENSOR) MISC USE AS DIRECTED 3 each 1   Continuous Glucose Transmitter (DEXCOM G6 TRANSMITTER) MISC USE AS DIRECTED 1 each 1   dapagliflozin propanediol (FARXIGA) 10 MG TABS tablet Take 1 tablet (10 mg total) by mouth daily before breakfast. 90 tablet 1   insulin  degludec (TRESIBA FLEXTOUCH) 100 UNIT/ML FlexTouch Pen Inject 7 Units into the skin daily.     isosorbide mononitrate (IMDUR) 120 MG 24 hr tablet TAKE 1 TABLET BY MOUTH EVERY DAY 90 tablet 1   metoprolol succinate (TOPROL-XL) 25 MG 24 hr tablet Take 0.5 tablets (12.5 mg total) by mouth daily.     mupirocin ointment (BACTROBAN) 2 % Apply 1 application. topically 2 (two) times daily. 30 g 2   nitroGLYCERIN (NITROSTAT) 0.4 MG SL tablet PLACE 1 TABLET UNDER THE TONGUE EVERY 5 MINUTES AS NEEDED FOR CHEST PAIN. MAX 3. CALL 911 25 tablet 3   OLANZapine (ZYPREXA) 20 MG tablet TAKE 1 TABLET BY MOUTH EVERYDAY AT BEDTIME 30 tablet 1   oxyCODONE-acetaminophen (PERCOCET/ROXICET) 5-325 MG tablet Take 1-2 tablets by mouth every 4 (four) hours as needed for moderate pain. 30 tablet 0   PARoxetine (PAXIL) 40 MG tablet Take 1 tablet (40 mg total) by mouth 2 (two) times daily. 180 tablet 0   ranolazine (RANEXA) 500 MG 12 hr tablet TAKE 1 TABLET BY MOUTH TWICE A DAY 180 tablet 3   saxagliptin HCl (ONGLYZA) 2.5 MG TABS tablet Take 1 tablet (2.5 mg total) by mouth daily. 90 tablet 1   XIIDRA 5 % SOLN Place 1 drop into both eyes in the morning and at bedtime.     No facility-administered medications prior to visit.    Allergies  Allergen Reactions   Metformin And Related Nausea And Vomiting    ROS See HPI    Objective:    Physical Exam Constitutional:      General: She is not in acute distress.    Appearance: Normal appearance. She is well-developed.  HENT:     Head: Normocephalic and atraumatic.     Right Ear: External ear normal.     Left Ear: External ear normal.  Eyes:     General: No scleral icterus. Neck:  Thyroid: No thyromegaly.  Cardiovascular:     Rate and Rhythm: Normal rate and regular rhythm.     Heart sounds: Normal heart sounds. No murmur heard. Pulmonary:     Effort: Pulmonary effort is normal. No respiratory distress.     Breath sounds: Normal breath sounds. No wheezing.   Musculoskeletal:     Cervical back: Neck supple.  Skin:    General: Skin is warm and dry.  Neurological:     Mental Status: She is alert and oriented to person, place, and time.  Psychiatric:        Mood and Affect: Mood normal.        Behavior: Behavior normal.        Thought Content: Thought content normal.        Judgment: Judgment normal.      BP 116/74 (BP Location: Left Arm, Patient Position: Standing, Cuff Size: Small)   Pulse 85   Temp 98.5 F (36.9 C) (Oral)   Resp 16   Ht 5\' 4"  (1.626 m)   Wt 126 lb (57.2 kg)   LMP 11/28/2009   SpO2 99%   BMI 21.63 kg/m  Wt Readings from Last 3 Encounters:  09/14/23 126 lb (57.2 kg)  08/30/23 133 lb (60.3 kg)  08/15/23 125 lb (56.7 kg)       Assessment & Plan:   Problem List Items Addressed This Visit       Unprioritized   Hypotension due to drugs - Primary   Improved with discontinuation of lisinopril.  We did perform orthostatic BP checks and she did not exhibit any significant orthostasis. Monitor.       Anxiety state   Maintained on clonazepam and clonidine ER- unfortunately her clonidine cannot be easily discontinued due to the help it is providing for anxiety- management to psych.        I am having Charlet Harr maintain her Clarke Crouch, blood glucose meter kit and supplies, aspirin EC, mupirocin ointment, Accu-Chek Softclix Lancets, BD Pen Needle Nano U/F, Vitamin D3, Accu-Chek Guide, Dexcom G6 Sensor, Dexcom G6 Receiver, ranolazine, busPIRone, clopidogrel, nitroGLYCERIN, oxyCODONE-acetaminophen, atorvastatin, cilostazol, Tresiba FlexTouch, dapagliflozin propanediol, OLANZapine, saxagliptin HCl, Dexcom G6 Transmitter, PARoxetine, clonazePAM, metoprolol succinate, isosorbide mononitrate, and cloNIDine ER.  No orders of the defined types were placed in this encounter.

## 2023-09-14 NOTE — Assessment & Plan Note (Signed)
 Maintained on clonazepam and clonidine ER- unfortunately her clonidine cannot be easily discontinued due to the help it is providing for anxiety- management to psych.

## 2023-09-14 NOTE — Progress Notes (Addendum)
   Established Patient Office Visit  Subjective   Patient ID: Bailey Greene, female    DOB: 08-Feb-1961  Age: 63 y.o. MRN: [id number]  Chief Complaint  Patient presents with   Hypotension    Here for follow up, discontinued lisinopril    Patient returns for blood pressure recheck. Patient was found to be hypotensive at last visit and lisinopril was discontinued. BP improved today, but patient continues to report dizziness, lightheadedness and fatigue. She reports these symptoms worsen when transitioning from sitting to standing position. She denies any syncopal or near syncopal episodes. She states her SBP readings at home have been in the 100s.  The patient also verbalizes a desire to stop taking clonazepam and to use lorazepam only. She will follow up with her psychiatrist to discuss.    Review of Systems  Eyes:  Positive for blurred vision.  Cardiovascular:  Positive for leg swelling. Negative for chest pain and palpitations.       Reports 2 day history of bilateral foot/ankle swelling after stopping lisinopril. Now resolved.   Neurological:  Positive for dizziness. Negative for headaches.       Dizziness at rest but worsens when going from sitting to standing position.       Objective:     BP 110/68 (BP Location: Right Arm, Patient Position: Sitting, Cuff Size: Small)   Pulse 81   Temp 98.5 F (36.9 C) (Oral)   Resp 16   Ht 5\' 4"  (1.626 m)   Wt 126 lb (57.2 kg)   LMP 11/28/2009   SpO2 99%   BMI 21.63 kg/m    Physical Exam Vitals reviewed.  Constitutional:      Appearance: Normal appearance.  HENT:     Head: Normocephalic and atraumatic.     Right Ear: External ear normal.     Left Ear: External ear normal.     Mouth/Throat:     Mouth: Mucous membranes are moist.     Pharynx: Oropharynx is clear.  Cardiovascular:     Rate and Rhythm: Normal rate and regular rhythm.     Pulses: Normal pulses.     Heart sounds: Normal heart sounds.  Pulmonary:     Effort:  Pulmonary effort is normal.     Breath sounds: Normal breath sounds.  Skin:    General: Skin is warm and dry.     Capillary Refill: Capillary refill takes less than 2 seconds.  Neurological:     Mental Status: She is alert and oriented to person, place, and time.  Psychiatric:        Mood and Affect: Mood normal.        Behavior: Behavior normal.        Thought Content: Thought content normal.      Assessment & Plan:   Hypotension - existing problem. Improved in office today. No orthostatic vital sign changes. Will continue to monitor patient.  Anxiety - existing problem. Continues on clonazepam and clonidine ER. Psychiatrist states clonidine has been most effective in managing patient's anxiety and does not recommend discontinuation. Psych will continue to follow patient.    Wilford Hanks, RN

## 2023-09-15 ENCOUNTER — Telehealth: Payer: Self-pay | Admitting: Psychiatry

## 2023-09-15 NOTE — Telephone Encounter (Signed)
 Addendum to attached message. And wonders if she can take it again. Phone number is [phone].

## 2023-09-15 NOTE — Telephone Encounter (Signed)
 Next visit is 10/18/23. Bailey Greene was on Clonazepam but she said it caused high blood pressure. She has been on Alprazolam in the past and wonders if

## 2023-09-19 NOTE — Telephone Encounter (Signed)
 Pt seen 3/5:  Better  duration after switch to clonazepam  1 mg HS, 0.5 mg AM.  But hangover. So reduce clonazepam  0.5 mg AM and 0.5-0.75 mg HS  Was previously on alprazolam  0.5 TID. She would like to switch back from clonazepam . Reports clonazepam  makes her too sleepy during the day.   [facility] [address]

## 2023-09-20 NOTE — Telephone Encounter (Signed)
 Before doing that ask her to reduce clonazepam  again to 1/2 of 0.5 mg at night and 0.5-0.75 mg HS

## 2023-09-21 NOTE — Telephone Encounter (Signed)
 Pt returned call and reviewed with her the recommendation for clonazepam  dosing. She is reporting unable to find the ER clonidine  in stock. Told her I would check with [facility] and if not available would ask Dr. Toi Foster to see if he could prescribe something else.

## 2023-09-21 NOTE — Telephone Encounter (Signed)
 LVM to [facility]

## 2023-09-22 ENCOUNTER — Telehealth: Payer: Self-pay

## 2023-09-22 ENCOUNTER — Other Ambulatory Visit: Payer: Self-pay | Admitting: Psychiatry

## 2023-09-22 DIAGNOSIS — F411 Generalized anxiety disorder: Secondary | ICD-10-CM

## 2023-09-22 DIAGNOSIS — F3162 Bipolar disorder, current episode mixed, moderate: Secondary | ICD-10-CM

## 2023-09-22 DIAGNOSIS — F4001 Agoraphobia with panic disorder: Secondary | ICD-10-CM

## 2023-09-22 MED ORDER — CLONIDINE HCL ER 0.1 MG PO TB12: 0.1000 mg | ORAL_TABLET | Freq: Two times a day (BID) | ORAL | 1 refills | Status: DC

## 2023-09-22 NOTE — Telephone Encounter (Signed)
 Note sent to Dr. Toi Foster about unavailability of Clonidine  ER, awaiting response.

## 2023-09-22 NOTE — Telephone Encounter (Signed)
 Patient prescribed Switched to clonidine  ER 0.17 mg AM   It is on backorder until mid May per [facility]. [facility]'s [facility] tried locating medication and is being told the same as above.   [facility] is [facility] [address]

## 2023-09-22 NOTE — Progress Notes (Signed)
 Change from clonidine  er 0.17 mg daily (unavailable) to 0.1 mg BID.  sent

## 2023-09-23 NOTE — Telephone Encounter (Signed)
 Patient notified of the new Rx for clonidine .

## 2023-09-24 ENCOUNTER — Other Ambulatory Visit: Payer: Self-pay | Admitting: Cardiology

## 2023-09-28 NOTE — Telephone Encounter (Signed)
 No further review

## 2023-10-07 LAB — HM MAMMOGRAPHY

## 2023-10-08 ENCOUNTER — Other Ambulatory Visit: Payer: Self-pay | Admitting: Family

## 2023-10-11 ENCOUNTER — Telehealth: Payer: Self-pay | Admitting: Psychiatry

## 2023-10-11 NOTE — Telephone Encounter (Signed)
 Next visit is 10/18/23. Dillan called stating that the Alprazolam  was increased and it is not helping and making her sleepy. Please call her at [phone].

## 2023-10-12 NOTE — Telephone Encounter (Addendum)
 Pt is not taking alprazolam , is taking clonazepam  and clonidine .  Unable to understand what dose of clonidine  she is taking. Called the pharmacy to see what was last filled and 0.1 mg ER was last filled. Pt reports taking 3 a day, but Rx is for 2 a day.

## 2023-10-12 NOTE — Telephone Encounter (Signed)
 Pt last seen in March.  So reduce clonazepam  0.5 mg AM and 0.5-0.75 mg HS  She was also prescribed clonidine  0.17 ER but it was on backorder and Rx for 0.1 mg ER BID was sent.  In April she was asking to switch back to alprazolam . Your response was:  Before doing that ask her to reduce clonazepam  again to 1/2 of 0.5 mg at night and 0.5-0.75 mg HS   Pt is asking again to switch back to alprazolam . Reports clonazepam  is not helping, taking 0.5 BID.   I asked what she was taking for clonidine  and she reported taking TID, but Rx is for BID. She said she is sleepy, saying it is the clonazepam . ? If it is taking more clonidine .   No new stressors, but continued work stress.

## 2023-10-13 NOTE — Telephone Encounter (Signed)
Recommendations reviewed with patient.

## 2023-10-13 NOTE — Telephone Encounter (Signed)
 I see her in 5 days.  Things are confusing with changes being made between appts.  Tell her to adjust clonazepam  as needed and tolerated until appt and we'll sort it out then

## 2023-10-14 ENCOUNTER — Other Ambulatory Visit: Payer: Self-pay | Admitting: Family

## 2023-10-17 ENCOUNTER — Other Ambulatory Visit: Payer: Self-pay | Admitting: Psychiatry

## 2023-10-18 ENCOUNTER — Ambulatory Visit (INDEPENDENT_AMBULATORY_CARE_PROVIDER_SITE_OTHER): Admitting: Psychiatry

## 2023-10-18 ENCOUNTER — Encounter: Payer: Self-pay | Admitting: Psychiatry

## 2023-10-18 VITALS — BP 103/65 | HR 80

## 2023-10-18 DIAGNOSIS — F3162 Bipolar disorder, current episode mixed, moderate: Secondary | ICD-10-CM | POA: Diagnosis not present

## 2023-10-18 DIAGNOSIS — F5105 Insomnia due to other mental disorder: Secondary | ICD-10-CM | POA: Diagnosis not present

## 2023-10-18 DIAGNOSIS — F411 Generalized anxiety disorder: Secondary | ICD-10-CM | POA: Diagnosis not present

## 2023-10-18 DIAGNOSIS — F4001 Agoraphobia with panic disorder: Secondary | ICD-10-CM | POA: Diagnosis not present

## 2023-10-18 MED ORDER — CLONIDINE HCL ER 0.1 MG PO TB12: 0.1000 mg | ORAL_TABLET | Freq: Two times a day (BID) | ORAL | 0 refills | Status: DC

## 2023-10-18 MED ORDER — CLONAZEPAM 1 MG PO TABS: 1.0000 mg | ORAL_TABLET | Freq: Two times a day (BID) | ORAL | 1 refills | Status: DC

## 2023-10-18 MED ORDER — OLANZAPINE 15 MG PO TABS: 15.0000 mg | ORAL_TABLET | Freq: Every day | ORAL | 0 refills | Status: DC

## 2023-10-18 MED ORDER — PAROXETINE HCL 40 MG PO TABS: 40.0000 mg | ORAL_TABLET | Freq: Two times a day (BID) | ORAL | 0 refills | Status: DC

## 2023-10-18 NOTE — Progress Notes (Signed)
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     Component Value Date/Time   NA 143 08/15/2023 1017   NA 141 07/23/2020 1300   K 4.2 08/15/2023 1017   CL 108 08/15/2023 1017   CO2 26 08/15/2023 1017   GLUCOSE 97 08/15/2023 1017   BUN 18 08/15/2023 1017   BUN 7 07/23/2020 1300   CREATININE 1.22 (H) 08/15/2023 1017   CREATININE 0.72 06/22/2012 0840   CALCIUM  9.8 08/15/2023 1017   PROT 6.9 11/30/2022 1351   PROT 6.6 03/29/2022 1504   ALBUMIN 3.9 11/30/2022 1351   ALBUMIN 4.6 03/29/2022 1504   AST 17 11/30/2022 1351   ALT 18 11/30/2022 1351   ALKPHOS 95 11/30/2022 1351   BILITOT 0.7 11/30/2022 1351   BILITOT 0.4 03/29/2022 1504   GFRNONAA 55 (L) 12/09/2022 0330   GFRNONAA >89 06/22/2012 0840   GFRAA 102 07/23/2020 1300   GFRAA >89 06/22/2012 0840       Component Value Date/Time   WBC 10.7 (H) 12/09/2022 0330   RBC 2.97 (L) 12/09/2022 0330   HGB 8.9 (L) 12/09/2022 0330   HCT 25.9 (L) 12/09/2022 0330   PLT 148 (L) 12/09/2022 0330   MCV 87.2 12/09/2022 0330   MCH 30.0 12/09/2022 0330   MCHC 34.4 12/09/2022 0330   RDW 13.8 12/09/2022 0330   LYMPHSABS 2.2 02/25/2022 0950   MONOABS 0.4 02/25/2022 0950   EOSABS 0.3  02/25/2022  0950   BASOSABS 0.0 02/25/2022 0950    No results found for: "POCLITH", "LITHIUM"   No results found for: "PHENYTOIN", "PHENOBARB", "VALPROATE", "CBMZ"   .res Assessment: Plan:    Aayra was seen today for follow-up, anxiety, depression, stress and medication reaction.  Diagnoses and all orders for this visit:  Panic disorder with agoraphobia -     PARoxetine  (PAXIL ) 40 MG tablet; Take 1 tablet (40 mg total) by mouth 2 (two) times daily. -     OLANZapine  (ZYPREXA ) 15 MG tablet; Take 1 tablet (15 mg total) by mouth at bedtime. -     cloNIDine  HCl (KAPVAY ) 0.1 MG TB12 ER tablet; Take 1 tablet (0.1 mg total) by mouth 2 (two) times daily. -     clonazePAM  (KLONOPIN ) 1 MG tablet; Take 1 tablet (1 mg total) by mouth 2 (two) times daily.  Generalized anxiety disorder -     PARoxetine  (PAXIL ) 40 MG tablet; Take 1 tablet (40 mg total) by mouth 2 (two) times daily. -     OLANZapine  (ZYPREXA ) 15 MG tablet; Take 1 tablet (15 mg total) by mouth at bedtime. -     cloNIDine  HCl (KAPVAY ) 0.1 MG TB12 ER tablet; Take 1 tablet (0.1 mg total) by mouth 2 (two) times daily. -     clonazePAM  (KLONOPIN ) 1 MG tablet; Take 1 tablet (1 mg total) by mouth 2 (two) times daily.  Moderate mixed bipolar I disorder (HCC) -     OLANZapine  (ZYPREXA ) 15 MG tablet; Take 1 tablet (15 mg total) by mouth at bedtime.  Insomnia due to mental condition -     OLANZapine  (ZYPREXA ) 15 MG tablet; Take 1 tablet (15 mg total) by mouth at bedtime.    Chronic work stress  30 min face to face time with patient was spent on counseling and coordination of care. We discussed the following:  Repeated bouts of STD DT anxiety and depression. Usually associated lately with increased work volume demands .  she is more anxious and depressed with panic lately affecting work.    DT hangover reduce olanzapine   mg 3 hours before HS.  Good benefit clonidine  for anxiety and irritability off label.  but Duration too short  with IR clonidine .   Switched to clonidine  ER 0.1 mg AM and PM..  Which is better.  More calm with H .  Dont' get as irritated.  But would like higher but can't today DT BP 103/68  Push fluids. She monitors BP  Continue high dose paroxetine  off label DT TRD and TR anxiety to 80 mg daily since early June 2024  Disc risk high dose.   Disc SE in detail and SSRI withdrawal sx.  Better  duration after switch to clonazepam  1 mg HS, 0.5 mg AM.  But hangover not better with reduction clonazepam  0.5 mg AM and 0.5-0.75 mg HS and anxiety not managed so increase to 1 mg BID.  Consider switch to sertraline .  Discussed potential metabolic side effects associated with atypical antipsychotics, as well as potential risk for movement side effects. Advised pt to contact office if movement side effects occur.  DM managed  We discussed the short-term risks associated with benzodiazepines including sedation and increased fall risk among others.  Discussed long-term side effect risk including dependence, potential withdrawal symptoms, and the potential eventual dose-related risk of dementia.  But recent studies from 2020 dispute this association between benzodiazepines and dementia risk. Newer studies in 2020 do not support an association with dementia. May have  to pursue a change in BZ if clonidine  doesn't help.  H much better out of hospital.  Has RTW but very stressed with work volume and pressure.  Follow-up 2 mos  Nori Beat MD, DFAPA  Please see After Visit Summary for patient specific instructions. Due To hangover reduce olanzapine  15 mg 3 hours before HS. For anxiety, increase clonazepam  to 1 mg twice daily Nori Beat, MD, DFAPA    Future Appointments  Date Time Provider Department Center  12/14/2023  9:40 AM Dorrene Gaucher, NP [facility]  12/20/2023  8:00 AM [facility]  12/20/2023  9:00 AM [facility]  12/20/2023  9:30 AM [facility]    No  orders of the defined types were placed in this encounter.      -------------------------------

## 2023-10-18 NOTE — Patient Instructions (Signed)
 Due To hangover reduce olanzapine  15 mg 3 hours before HS. For anxiety, increase clonazepam  to 1 mg twice daily

## 2023-10-24 ENCOUNTER — Telehealth: Payer: Self-pay | Admitting: Family

## 2023-10-24 NOTE — Telephone Encounter (Signed)
 Please request pap and mammo from [facility].

## 2023-10-25 NOTE — Telephone Encounter (Signed)
 Electronic request made for this

## 2023-10-31 ENCOUNTER — Encounter: Payer: Self-pay | Admitting: Family

## 2023-11-14 ENCOUNTER — Telehealth: Payer: Self-pay

## 2023-11-14 NOTE — Telephone Encounter (Signed)
 Initial Comment Caller states she is calling to reschedule an appt. Pt is having low energy, shortness of breath, and leg pain. Translation No Nurse Assessment Nurse: Delayne Feather, RN, Jenette Mitchell Date/Time (Eastern Time): 11/14/2023 8:37:32 AM Confirm and document reason for call. If symptomatic, describe symptoms. ---Caller states she is having low energy, shortness of breath with exertion, and leg pain. Sxs have been happening for 2 weeks. Pt states she had some swelling around her ankles. Does the patient have any new or worsening symptoms? ---Yes Will a triage be completed? ---Yes Related visit to physician within the last 2 weeks? ---No Does the PT have any chronic conditions? (i.e. diabetes, asthma, this includes High risk factors for pregnancy, etc.) ---Yes List chronic conditions. ---diabetes, CVD, stent in the leg right Is this a behavioral health or substance abuse call? ---No Guidelines Guideline Title Affirmed Question Affirmed Notes Nurse Date/Time (Eastern Time) Breathing Difficulty [1] MODERATE difficulty breathing (e.g., speaks in phrases, SOB even at rest, pulse 100-120) AND [2] NEW-onset or WORSE than normal Nicastro, RN, Jenette Mitchell 11/14/2023 8:40:11 AM PLEASE NOTE: All timestamps contained within this report are represented as [place] Standard Time. CONFIDENTIALTY NOTICE: This fax transmission is intended only for the addressee. It contains information that is legally privileged, confidential or otherwise protected from use or disclosure. If you are not the intended recipient, you are strictly prohibited from reviewing, disclosing, copying using or disseminating any of this information or taking any action in reliance on or regarding this information. If you have received this fax in error, please notify us  immediately by telephone so that we can arrange for its return to us . Phone: [phone], Toll-Free: [phone], Fax:  [phone] [name]_PETERS Jun 20, 1960 Page: 2 of 2 CallId: [id number] Disp. Time [facility] Time) Disposition Final User 11/14/2023 8:36:32 AM Send to Urgent Queue Sedrick Dada 11/14/2023 8:43:20 AM Go to ED Now Yes Nicastro, RN, Jenette Mitchell Final Disposition 11/14/2023 8:43:20 AM Go to ED Now Yes Nicastro, RN, Arty Later Disagree/Comply Comply Caller Understands Yes PreDisposition InappropriateToAsk Care Advice Given Per Guideline GO TO ED NOW: * You need to be seen in the Emergency Department. * Go to the ED at ___________ Hospital. * Leave now. Drive carefully. NOTE TO TRIAGER - DRIVING: * Another adult should drive. * Patient should not delay going to the emergency department. * If immediate transportation is not available via car, rideshare (e.g., Lyft, Uber), or taxi, then the patient should be instructed to call [id number]. BRING MEDICINES: * Bring a list of your current medicines when you go to the Emergency Department (ER). * Bring the pill bottles too. This will help the doctor (or NP/PA) to make certain you are taking the right medicines and the right dose. CALL 911 IF: * You become worse CARE ADVICE given per Breathing Difficulty (Adult) guideline. Comments User: Wendy, Nicastro, RN Date/Time Redgie Cancer Time): 11/14/2023 8:44:14 AM Pt stated she is calling her husband to take her to the ER Referrals [facility] - ED

## 2023-11-16 ENCOUNTER — Emergency Department (HOSPITAL_COMMUNITY)
Admission: EM | Admit: 2023-11-16 | Discharge: 2023-11-17 | Disposition: A | Discharge status: Home / Self Care | Attending: Emergency Medicine | Admitting: Emergency Medicine

## 2023-11-16 ENCOUNTER — Other Ambulatory Visit: Payer: Self-pay

## 2023-11-16 DIAGNOSIS — R600 Localized edema: Secondary | ICD-10-CM | POA: Diagnosis not present

## 2023-11-16 DIAGNOSIS — R0789 Other chest pain: Secondary | ICD-10-CM | POA: Diagnosis not present

## 2023-11-16 DIAGNOSIS — Z951 Presence of aortocoronary bypass graft: Secondary | ICD-10-CM | POA: Diagnosis not present

## 2023-11-16 DIAGNOSIS — Z7902 Long term (current) use of antithrombotics/antiplatelets: Secondary | ICD-10-CM | POA: Diagnosis not present

## 2023-11-16 DIAGNOSIS — Z7982 Long term (current) use of aspirin: Secondary | ICD-10-CM | POA: Diagnosis not present

## 2023-11-16 DIAGNOSIS — R002 Palpitations: Secondary | ICD-10-CM | POA: Diagnosis present

## 2023-11-16 DIAGNOSIS — R0602 Shortness of breath: Secondary | ICD-10-CM | POA: Diagnosis not present

## 2023-11-16 DIAGNOSIS — M7989 Other specified soft tissue disorders: Secondary | ICD-10-CM | POA: Diagnosis not present

## 2023-11-16 NOTE — ED Triage Notes (Signed)
 Pt reports feeling heart palpitations, dizziness and bilateral leg pain, onset Sunday. Denies chest pain or shortness of breath. Hx of PE, NSTEMI, CABG

## 2023-11-17 ENCOUNTER — Emergency Department (HOSPITAL_COMMUNITY)

## 2023-11-17 DIAGNOSIS — M7989 Other specified soft tissue disorders: Secondary | ICD-10-CM | POA: Diagnosis not present

## 2023-11-17 LAB — CBC
HCT: 38.1 % (ref 36.0–46.0)
Hemoglobin: 12.6 g/dL (ref 12.0–15.0)
MCH: 30.5 pg (ref 26.0–34.0)
MCHC: 33.1 g/dL (ref 30.0–36.0)
MCV: 92.3 fL (ref 80.0–100.0)
Platelets: 172 10*3/uL (ref 150–400)
RBC: 4.13 MIL/uL (ref 3.87–5.11)
RDW: 14.2 % (ref 11.5–15.5)
WBC: 7.2 10*3/uL (ref 4.0–10.5)
nRBC: 0 % (ref 0.0–0.2)

## 2023-11-17 LAB — BASIC METABOLIC PANEL WITH GFR
Anion gap: 8 (ref 5–15)
BUN: 17 mg/dL (ref 6–20)
CO2: 24 mmol/L (ref 22–32)
Calcium: 9.5 mg/dL (ref 8.9–10.3)
Chloride: 106 mmol/L (ref 98–111)
Creatinine, Ser: 1.18 mg/dL — ABNORMAL HIGH (ref 0.44–1.00)
GFR, Estimated: 53 mL/min — ABNORMAL LOW (ref >60)
Glucose, Bld: 71 mg/dL (ref 70–99)
Potassium: 3.5 mmol/L (ref 3.5–5.1)
Sodium: 138 mmol/L (ref 135–145)

## 2023-11-17 LAB — TROPONIN I (HIGH SENSITIVITY)
Troponin I (High Sensitivity): 5 ng/L (ref <18)
Troponin I (High Sensitivity): 6 ng/L (ref <18)

## 2023-11-17 LAB — D-DIMER, QUANTITATIVE: D-Dimer, Quant: 0.32 ug{FEU}/mL (ref 0.00–0.50)

## 2023-11-17 MED ORDER — IOHEXOL 350 MG/ML SOLN
75.0000 mL | Freq: Once | INTRAVENOUS | Status: AC | PRN
  Administered 2023-11-17: 75 mL via INTRAVENOUS

## 2023-11-17 NOTE — Discharge Instructions (Signed)
 Return for any problem.  ?

## 2023-11-17 NOTE — ED Provider Notes (Signed)
 [place] EMERGENCY DEPARTMENT AT [facility] Provider Note   CSN: [id number] Arrival date & time: 11/16/23  2339     Patient presents with: Palpitations   Bailey Greene is a 63 y.o. female.   63 year old female with prior medical history as detailed below presents for evaluation.  Patient with multiple complaints including chest discomfort, shortness of breath, palpitations, leg swelling.  Patient with history of prior PE, NSTEMI, CABG.  She reports that symptoms began Sunday.  She feels much better at the time of my evaluation.  She has been here for quite some time waiting for evaluation.  The history is provided by the patient and medical records.       Prior to Admission medications   Medication Sig Start Date End Date Taking? Authorizing Provider  ACCU-CHEK GUIDE TEST test strip USE UP TO FOUR TIMES DAILY AS DIRECTED 10/16/23   Dorrene Gaucher, NP  Accu-Chek Softclix Lancets lancets USE UP TO FOUR TIMES DAILY AS DIRECTED 12/15/21   Dorrene Gaucher, NP  aspirin  81 MG EC tablet Take 1 tablet (81 mg total) by mouth daily. Swallow whole. 02/10/21   O'Sullivan, Melissa, NP  atorvastatin  (LIPITOR ) 80 MG tablet TAKE 1 TABLET BY MOUTH EVERY DAY 02/07/23   Millicent Ally, MD  BD PEN NEEDLE NANO U/F 32G X 4 MM MISC USE AS DIRECTED 04/25/22   Dorrene Gaucher, NP  blood glucose meter kit and supplies KIT Dispense based on patient and insurance preference. Use up to four times daily as directed. (FOR ICD-9 250.00, 250.01). 07/09/20   Vann, Jessica U, DO  busPIRone  (BUSPAR ) 30 MG tablet Take 30 mg by mouth daily. 03/12/20   [provider]  Cholecalciferol (VITAMIN D3) 75 MCG (3000 UT) TABS Take 1 tablet by mouth daily at 6 (six) AM. 07/07/22   Dorrene Gaucher, NP  cilostazol  (PLETAL ) 100 MG tablet TAKE 1 TABLET BY MOUTH TWICE A DAY BEFORE MEALS 03/07/23   Carlene Che, MD  clonazePAM  (KLONOPIN ) 1 MG tablet Take 1 tablet (1 mg total) by mouth 2 (two)  times daily. 10/18/23   Cottle, Kennedy Peabody., MD  cloNIDine  HCl (KAPVAY ) 0.1 MG TB12 ER tablet Take 1 tablet (0.1 mg total) by mouth 2 (two) times daily. 10/18/23   Cottle, Kennedy Peabody., MD  clopidogrel  (PLAVIX ) 75 MG tablet TAKE 1 TABLET BY MOUTH EVERY DAY WITH BREAKFAST 11/24/22   Eilleen Grates, MD  Continuous Glucose Receiver (DEXCOM G6 RECEIVER) DEVI Use as directed 09/20/22   Dorrene Gaucher, NP  Continuous Glucose Sensor (DEXCOM G6 SENSOR) MISC USE AS DIRECTED 09/20/22   Dorrene Gaucher, NP  Continuous Glucose Transmitter (DEXCOM G6 TRANSMITTER) MISC USE AS DIRECTED 06/28/23   Dorrene Gaucher, NP  dapagliflozin  propanediol (FARXIGA ) 10 MG TABS tablet Take 1 tablet (10 mg total) by mouth daily before breakfast. 05/03/23   Dorrene Gaucher, NP  insulin  degludec (TRESIBA  FLEXTOUCH) 100 UNIT/ML FlexTouch Pen INJECT 12 UNITS INTO THE SKIN DAILY. 10/10/23   O'Sullivan, Melissa, NP  isosorbide  mononitrate (IMDUR ) 120 MG 24 hr tablet TAKE 1 TABLET BY MOUTH EVERY DAY 08/29/23   Eilleen Grates, MD  metoprolol  succinate (TOPROL -XL) 25 MG 24 hr tablet Take 0.5 tablets (12.5 mg total) by mouth daily. 08/15/23   O'Sullivan, Melissa, NP  mupirocin  ointment (BACTROBAN ) 2 % Apply 1 application. topically 2 (two) times daily. 08/21/21   Orval Blanc, DPM  nitroGLYCERIN  (NITROSTAT ) 0.4 MG SL tablet PLACE 1 TABLET UNDER THE TONGUE EVERY 5 MINUTES AS NEEDED FOR  CHEST PAIN. MAX 3. CALL 911 11/24/22   Eilleen Grates, MD  OLANZapine  (ZYPREXA ) 15 MG tablet Take 1 tablet (15 mg total) by mouth at bedtime. 10/18/23   Cottle, Kennedy Peabody., MD  oxyCODONE -acetaminophen  (PERCOCET/ROXICET) 5-325 MG tablet Take 1-2 tablets by mouth every 4 (four) hours as needed for moderate pain. 12/09/22   Butch Cashing, PA-C  PARoxetine  (PAXIL ) 40 MG tablet Take 1 tablet (40 mg total) by mouth 2 (two) times daily. 10/18/23   Cottle, Kennedy Peabody., MD  ranolazine  (RANEXA ) 500 MG 12 hr tablet TAKE 1 TABLET BY MOUTH TWICE A DAY 09/27/23    Eilleen Grates, MD  saxagliptin  HCl (ONGLYZA) 2.5 MG TABS tablet Take 1 tablet (2.5 mg total) by mouth daily. 06/05/23   O'Sullivan, Melissa, NP  XIIDRA  5 % SOLN Place 1 drop into both eyes in the morning and at bedtime. 05/29/20   [provider]    Allergies: Metformin  and related    Review of Systems  All other systems reviewed and are negative.   Updated Vital Signs BP 129/82 (BP Location: Left Wrist)   Pulse 65   Temp (!) 97.5 F (36.4 C) (Oral)   Resp 17   Ht 5' 4 (1.626 m)   Wt 57.2 kg   LMP 11/28/2009   SpO2 100%   BMI 21.63 kg/m   Physical Exam Vitals and nursing note reviewed.  Constitutional:      General: She is not in acute distress.    Appearance: Normal appearance. She is well-developed.  HENT:     Head: Normocephalic and atraumatic.   Eyes:     Conjunctiva/sclera: Conjunctivae normal.     Pupils: Pupils are equal, round, and reactive to light.    Cardiovascular:     Rate and Rhythm: Normal rate and regular rhythm.     Heart sounds: Normal heart sounds.  Pulmonary:     Effort: Pulmonary effort is normal. No respiratory distress.     Breath sounds: Normal breath sounds.  Abdominal:     General: There is no distension.     Palpations: Abdomen is soft.     Tenderness: There is no abdominal tenderness.   Musculoskeletal:        General: No deformity. Normal range of motion.     Cervical back: Normal range of motion and neck supple.   Skin:    General: Skin is warm and dry.   Neurological:     General: No focal deficit present.     Mental Status: She is alert and oriented to person, place, and time.     (all labs ordered are listed, but only abnormal results are displayed) Labs Reviewed  BASIC METABOLIC PANEL WITH GFR - Abnormal; Notable for the following components:      Result Value   Creatinine, Ser 1.18 (*)    GFR, Estimated 53 (*)    All other components within normal limits  CBC  D-DIMER, QUANTITATIVE  TROPONIN I (HIGH  SENSITIVITY)  TROPONIN I (HIGH SENSITIVITY)    EKG: EKG Interpretation Date/Time:  Wednesday November 16 2023 23:51:05 EDT Ventricular Rate:  80 PR Interval:  154 QRS Duration:  74 QT Interval:  364 QTC Calculation: 419 R Axis:   48  Text Interpretation: Normal sinus rhythm with sinus arrhythmia Normal ECG When compared with ECG of 22-Nov-2022 08:49, PREVIOUS ECG IS PRESENT Confirmed by Angela Kell [phone]) on 11/17/2023 8:12:01 AM  Radiology: CT Angio Chest PE W and/or Wo Contrast Result Date:  11/17/2023 CLINICAL DATA:  Palpitations with dizziness and bilateral leg pain. EXAM: CT ANGIOGRAPHY CHEST WITH CONTRAST TECHNIQUE: Multidetector CT imaging of the chest was performed using the standard protocol during bolus administration of intravenous contrast. Multiplanar CT image reconstructions and MIPs were obtained to evaluate the vascular anatomy. RADIATION DOSE REDUCTION: This exam was performed according to the departmental dose-optimization program which includes automated exposure control, adjustment of the mA and/or kV according to patient size and/or use of iterative reconstruction technique. CONTRAST:  75mL OMNIPAQUE  IOHEXOL  350 MG/ML SOLN COMPARISON:  May 05, 2021 FINDINGS: Cardiovascular: There is marked severity calcification of the aortic arch. Satisfactory opacification of the pulmonary arteries to the segmental level. No evidence of pulmonary embolism. Normal heart size with marked severity coronary artery calcification. No pericardial effusion. Mediastinum/Nodes: No enlarged mediastinal, hilar, or axillary lymph nodes. Thyroid  gland, trachea, and esophagus demonstrate no significant findings. Lungs/Pleura: There is no evidence of acute infiltrate, pleural effusion or pneumothorax. Upper Abdomen: There is stable, diffuse bilateral adrenal gland enlargement, left greater than right. Musculoskeletal: Multiple sternal wires are present. Multilevel degenerative changes seen throughout the  thoracic spine. Review of the MIP images confirms the above findings. IMPRESSION: 1. No evidence of pulmonary embolism or other acute intrathoracic process. 2. Marked severity coronary artery calcification. 3. Stable, diffuse bilateral adrenal gland enlargement, left greater than right. 4. Aortic atherosclerosis. Electronically Signed   By: Virgle Grime M.D.   On: 11/17/2023 01:40   DG Chest 2 View Result Date: 11/17/2023 CLINICAL DATA:  Palpitations. EXAM: CHEST - 2 VIEW COMPARISON:  May 05, 2021 FINDINGS: Multiple sternal wires and vascular clips are noted. The heart size and mediastinal contours are within normal limits. Marked severity calcification of the aortic arch is seen. There is no evidence of acute infiltrate, pleural effusion or pneumothorax. The visualized skeletal structures are unremarkable. IMPRESSION: 1. Evidence of prior median sternotomy/CABG. 2. No acute cardiopulmonary disease. Electronically Signed   By: Virgle Grime M.D.   On: 11/17/2023 00:27     Procedures   Medications Ordered in the ED  iohexol  (OMNIPAQUE ) 350 MG/ML injection 75 mL (75 mLs Intravenous Contrast Given 11/17/23 0132)                                     Medical Decision Making Amount and/or Complexity of Data Reviewed Labs: ordered. Radiology: ordered.    Medical Screen Complete  This patient presented to the ED with complaint of palpitations, chest pain, shortness of breath, leg swelling.  This complaint involves an extensive number of treatment options. The initial differential diagnosis includes, but is not limited to, ACS, PE, metabolic abnormality, thrombosis, etc.  This presentation is: Acute, Chronic, Self-Limited, Previously Undiagnosed, Uncertain Prognosis, Complicated, Systemic Symptoms, and Threat to Life/Bodily Function  Patient presents with multiple nonspecific complaints including palpitations, chest pain, shortness of breath, leg swelling.  Symptoms appear to be  ongoing for the last week.  Patient with lengthy wait time.  At the time of my evaluation she reports that she feels much improved.  She denies active chest pain or current shortness of breath.  Obtained workup is on the whole without significant acute abnormality.  Patient feels reassured and improved after evaluation.  Importance of close follow-up stressed.  Strict return precautions given and understood. is comfortable with plan for discharge home.    Additional history obtained: External records from outside sources obtained and reviewed including prior  ED visits and prior Inpatient records.    Problem List / ED Course:  Palpitations, atypical chest pain, edema   Reevaluation:  After the interventions noted above, I reevaluated the patient and found that they have: resolved Disposition:  After consideration of the diagnostic results and the patients response to treatment, I feel that the patent would benefit from close outpatient follow-up.       Final diagnoses:  Palpitations    ED Discharge Orders     None          Burnette Carte, MD 11/17/23 1138

## 2023-11-17 NOTE — ED Provider Triage Note (Signed)
 Emergency Medicine Provider Triage Evaluation Note  Bailey Greene , a 63 y.o. female  was evaluated in triage.  Pt complains of CP, shortness of breath, and left leg swelling. Hx of PE.   Review of Systems  Positive:  Negative:   Physical Exam  BP 109/78 (BP Location: Right Arm)   Pulse 80   Temp 98.6 F (37 C)   Resp 20   Ht 5' 4 (1.626 m)   Wt 57.2 kg   LMP 11/28/2009   SpO2 100%   BMI 21.63 kg/m  Gen:   Awake, no distress   Resp:  Normal effort  MSK:   Moves extremities without difficulty, left leg swelling Other:     Medical Decision Making  Medically screening exam initiated at 12:30 AM.  Appropriate orders placed.  Bailey Greene was informed that the remainder of the evaluation will be completed by another provider, this initial triage assessment does not replace that evaluation, and the importance of remaining in the ED until their evaluation is complete.     Felicie Horning, PA-C 11/17/23 [phone]

## 2023-11-18 ENCOUNTER — Telehealth: Payer: Self-pay | Admitting: Family

## 2023-11-18 ENCOUNTER — Other Ambulatory Visit: Payer: Self-pay | Admitting: Psychiatry

## 2023-11-18 DIAGNOSIS — F4001 Agoraphobia with panic disorder: Secondary | ICD-10-CM

## 2023-11-18 DIAGNOSIS — F411 Generalized anxiety disorder: Secondary | ICD-10-CM

## 2023-11-18 NOTE — Telephone Encounter (Signed)
 Copied from CRM [id number]. Topic: Medical Record Request - Other >> Nov 18, 2023  2:55 PM Alyse July wrote: Reason for CRM: Patient would like a call back pertaining to FMLA forms that were faxed on Monday 11/14/23. Please contact patient once forms are completed.  CB#[phone]

## 2023-11-23 ENCOUNTER — Telehealth: Payer: Self-pay | Admitting: Family

## 2023-11-23 ENCOUNTER — Ambulatory Visit: Admitting: Family

## 2023-11-23 VITALS — BP 93/62 | HR 87 | Temp 98.8°F | Resp 16 | Ht 64.0 in | Wt 130.0 lb

## 2023-11-23 DIAGNOSIS — E118 Type 2 diabetes mellitus with unspecified complications: Secondary | ICD-10-CM | POA: Diagnosis not present

## 2023-11-23 DIAGNOSIS — H8113 Benign paroxysmal vertigo, bilateral: Secondary | ICD-10-CM | POA: Diagnosis not present

## 2023-11-23 DIAGNOSIS — Z794 Long term (current) use of insulin: Secondary | ICD-10-CM | POA: Diagnosis not present

## 2023-11-23 MED ORDER — MECLIZINE HCL 25 MG PO TABS
25.0000 mg | ORAL_TABLET | Freq: Three times a day (TID) | ORAL | 0 refills | Status: AC | PRN
Start: 2023-11-23 — End: ?

## 2023-11-23 NOTE — Telephone Encounter (Signed)
 Please request pap and last office note from [facility].

## 2023-11-23 NOTE — Progress Notes (Unsigned)
 Subjective:     Patient ID: Bailey Greene, female    DOB: 1960/11/20, 63 y.o.   MRN: [id number]  Chief Complaint  Patient presents with   Dizziness    Patient complains of dizziness, worse in the past 2 weeks   Fatigue    HPI  Discussed the use of AI scribe software for clinical note transcription with the patient, who gave verbal consent to proceed.  History of Present Illness   Bailey Greene is a 63 year old female with anxiety and coronary artery disease who presents with palpitations and dizziness.  Palpitations occur with episodes of anxiety and panic attacks, often triggered by overexertion. These episodes are accompanied by dizziness, weakness, and occasional loss of balance, with a recent fall. Dizziness worsens with quick head movements or positional changes and is more pronounced when lying flat or upon getting up from bed. Blood pressure is low, recorded at 93/62.  She notes recent swelling in her feet. An emergency room visit on June 18th for chest discomfort, shortness of breath, palpitations, and leg swelling included a CT scan negative for pulmonary embolism but showed coronary artery calcification. A chest x-ray was normal.  She takes Paxil  and Zyprexa  as prescribed. She experiences hot flashes and was prescribed a medication starting with 'V', though she is unsure of the name.    Health Maintenance Due  Topic Date Due   Colonoscopy  Never done   COVID-19 Vaccine (7 - 2024-25 season) 01/30/2023   OPHTHALMOLOGY EXAM  02/26/2023   Cervical Cancer Screening (HPV/Pap Cotest)  03/12/2023    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   Qualifier: Diagnosis of  By: Chioma Riggs     Diabetes mellitus type 2 in nonobese (HCC) 07/2016   Dyslipidemia    Headache(784.0)    History of  pulmonary embolism 07/08/2020   HTN (hypertension)    Migraine    NSTEMI (non-ST elevated myocardial infarction) (HCC)    PAD (peripheral artery disease) (HCC)    Peripheral arterial disease (HCC)    Pulmonary embolus (HCC)    unprovoked    Past Surgical History:  Procedure Laterality Date   AORTOGRAM N/A 12/08/2022   Procedure: AORTOGRAM;  Surgeon: Magda Debby SAILOR, MD;  Location: [facility] OR;  Service: Vascular;  Laterality: N/A;   BUNIONECTOMY  08/2011   right foot   CARDIAC CATHETERIZATION  2007   severe native 3 v dz, all grafts patent (LIMA-LAD, RIMA-Diag, SVG-OM1-OM2, R radial-PDA)   CESAREAN SECTION  1984   CORONARY ARTERY BYPASS GRAFT  2006    Coronary artery bypass grafting x5 with a left  internal  mammary to the left anterior descending coronary artery.  Free right  internal mammary to the diagonal coronary artery, sequential reverse  saphenous vein graft to the first and second obtuse marginal, right  artery bypass to the posterior descending coronary artery with endo-vein harvesting.   ENDARTERECTOMY FEMORAL Right 12/08/2022   Procedure: RIGHT COMMON FEMORAL ENDARTERECTOMY;  Surgeon: Magda Debby SAILOR, MD;  Location: [facility] OR;  Service: Vascular;  Laterality: Right;   INSERTION OF ILIAC STENT Bilateral 12/08/2022   Procedure: INSERTION OF BILATERAL ILIAC KISSING STENTS USING VIABAHN VBX STENTS;  Surgeon: Magda Debby SAILOR, MD;  Location: [facility] OR;  Service: Vascular;  Laterality: Bilateral;   LEFT HEART CATH AND CORS/GRAFTS  ANGIOGRAPHY N/A 05/05/2021   Procedure: LEFT HEART CATH AND CORS/GRAFTS ANGIOGRAPHY;  Surgeon: Anner Alm ORN, MD;  Location: [facility] INVASIVE CV LAB;  Service: Cardiovascular;  Laterality: N/A;   ULTRASOUND GUIDANCE FOR VASCULAR ACCESS Left 12/08/2022   Procedure: ULTRASOUND GUIDANCE FOR VASCULAR ACCESS OF LEFT FEMORAL ARTERY;  Surgeon: Magda Debby SAILOR, MD;  Location: [facility] OR;  Service: Vascular;  Laterality: Left;   UMBILICAL HERNIA REPAIR N/A 01/16/2021   Procedure:  UMBILICAL  HERNIA REPAIR;  Surgeon: Dasie Leonor CROME, MD;  Location: [facility] OR;  Service: General;  Laterality: N/A;    Family History  Problem Relation Age of Onset   Lupus Mother    Heart attack Father    Diabetes Father    Hypertension Father    Diabetes Paternal Grandmother    Hypertension Paternal Grandmother    Stroke Neg Hx    Kidney disease Neg Hx    Hyperlipidemia Neg Hx    Sudden death Neg Hx     Social History   Socioeconomic History   Marital status: Legally Separated    Spouse name: Not on file   Number of children: Not on file   Years of education: Not on file   Highest education level: Some college, no degree  Occupational History   Occupation: Armed forces training and education officer  Tobacco Use   Smoking status: Former    Current packs/day: 0.00    Types: Cigarettes    Start date: 09/12/1996    Quit date: 09/12/2021    Years since quitting: 2.2   Smokeless tobacco: Never   Tobacco comments:    0.5 pack cigarettes  per month per patient   Vaping Use   Vaping status: Never Used  Substance and Sexual Activity   Alcohol use: No    Alcohol/week: 0.0 standard drinks of alcohol   Drug use: Never   Sexual activity: Yes    Partners: Male    Comment: married  Other Topics Concern   Not on file  Social History Narrative   Regular exercise:  3 days weeklyCaffeine    Use:  1 soda daily   One child biological daughter born in 75 and an adopted niece.[employer]- Engineer, technical sales   Married- may be divorcing.    Social Drivers of Health   Financial Resource Strain: Medium Risk (10/05/2022)   Overall Financial Resource Strain (CARDIA)    Difficulty of Paying Living Expenses: Somewhat hard  Food Insecurity: Food Insecurity Present (10/05/2022)   Hunger Vital Sign    Worried About Running Out of Food in the Last Year: Sometimes true    Ran Out of Food in the Last Year: Sometimes true  Transportation Needs: Unknown (05/03/2023)   PRAPARE - Scientist, research (physical sciences) (Medical): No    Lack of Transportation (Non-Medical): Not on file  Physical Activity: Insufficiently Active (10/05/2022)   Exercise Vital Sign    Days of Exercise per Week: 3 days    Minutes of Exercise per Session: 20 min  Stress: Stress Concern Present (10/05/2022)   [employer] of Occupational Health - Occupational Stress Questionnaire    Feeling of Stress : Rather much  Social Connections: Unknown (10/05/2022)   Social Connection and Isolation Panel    Frequency of Communication with Friends and Family: Twice a week    Frequency of Social Gatherings with Friends and Family: Once a week    Attends Religious Services: Patient declined    Database administrator or Organizations: No  Attends Banker Meetings: Not on file    Marital Status: Married  Catering manager Violence: Not on file    Outpatient Medications Prior to Visit  Medication Sig Dispense Refill   ACCU-CHEK GUIDE TEST test strip USE UP TO FOUR TIMES DAILY AS DIRECTED 400 strip 6   Accu-Chek Softclix Lancets lancets USE UP TO FOUR TIMES DAILY AS DIRECTED 100 each 12   aspirin  81 MG EC tablet Take 1 tablet (81 mg total) by mouth daily. Swallow whole. 30 tablet 12   atorvastatin  (LIPITOR ) 80 MG tablet TAKE 1 TABLET BY MOUTH EVERY DAY 90 tablet 3   BD PEN NEEDLE NANO U/F 32G X 4 MM MISC USE AS DIRECTED 300 each 1   blood glucose meter kit and supplies KIT Dispense based on patient and insurance preference. Use up to four times daily as directed. (FOR ICD-9 250.00, 250.01). 1 each 0   busPIRone  (BUSPAR ) 30 MG tablet Take 30 mg by mouth daily.     Cholecalciferol (VITAMIN D3) 75 MCG (3000 UT) TABS Take 1 tablet by mouth daily at 6 (six) AM. 30 tablet    cilostazol  (PLETAL ) 100 MG tablet TAKE 1 TABLET BY MOUTH TWICE A DAY BEFORE MEALS 180 tablet 3   clonazePAM  (KLONOPIN ) 1 MG tablet Take 1 tablet (1 mg total) by mouth 2 (two) times daily. 60 tablet 1   cloNIDine  HCl (KAPVAY ) 0.1 MG TB12 ER tablet  Take 1 tablet (0.1 mg total) by mouth 2 (two) times daily. 180 tablet 0   clopidogrel  (PLAVIX ) 75 MG tablet TAKE 1 TABLET BY MOUTH EVERY DAY WITH BREAKFAST 90 tablet 3   Continuous Glucose Receiver (DEXCOM G6 RECEIVER) DEVI Use as directed 1 each 0   Continuous Glucose Sensor (DEXCOM G6 SENSOR) MISC USE AS DIRECTED 3 each 1   Continuous Glucose Transmitter (DEXCOM G6 TRANSMITTER) MISC USE AS DIRECTED 1 each 1   dapagliflozin  propanediol (FARXIGA ) 10 MG TABS tablet Take 1 tablet (10 mg total) by mouth daily before breakfast. 90 tablet 1   insulin  degludec (TRESIBA  FLEXTOUCH) 100 UNIT/ML FlexTouch Pen INJECT 12 UNITS INTO THE SKIN DAILY. 3 mL 5   isosorbide  mononitrate (IMDUR ) 120 MG 24 hr tablet TAKE 1 TABLET BY MOUTH EVERY DAY 90 tablet 1   metoprolol  succinate (TOPROL -XL) 25 MG 24 hr tablet Take 0.5 tablets (12.5 mg total) by mouth daily.     mupirocin  ointment (BACTROBAN ) 2 % Apply 1 application. topically 2 (two) times daily. 30 g 2   nitroGLYCERIN  (NITROSTAT ) 0.4 MG SL tablet PLACE 1 TABLET UNDER THE TONGUE EVERY 5 MINUTES AS NEEDED FOR CHEST PAIN. MAX 3. CALL 911 25 tablet 3   OLANZapine  (ZYPREXA ) 15 MG tablet Take 1 tablet (15 mg total) by mouth at bedtime. 90 tablet 0   oxyCODONE -acetaminophen  (PERCOCET/ROXICET) 5-325 MG tablet Take 1-2 tablets by mouth every 4 (four) hours as needed for moderate pain. 30 tablet 0   PARoxetine  (PAXIL ) 40 MG tablet Take 1 tablet (40 mg total) by mouth 2 (two) times daily. 180 tablet 0   ranolazine  (RANEXA ) 500 MG 12 hr tablet TAKE 1 TABLET BY MOUTH TWICE A DAY 180 tablet 0   saxagliptin  HCl (ONGLYZA) 2.5 MG TABS tablet Take 1 tablet (2.5 mg total) by mouth daily. 90 tablet 1   XIIDRA  5 % SOLN Place 1 drop into both eyes in the morning and at bedtime.     No facility-administered medications prior to visit.    Allergies  Allergen Reactions  Metformin  And Related Nausea And Vomiting    ROS See HPI    Objective:    Physical Exam Constitutional:       General: She is not in acute distress.    Appearance: Normal appearance. She is well-developed.  HENT:     Head: Normocephalic and atraumatic.     Right Ear: External ear normal.     Left Ear: External ear normal.   Eyes:     General: No scleral icterus.  Neck:     Thyroid : No thyromegaly.   Cardiovascular:     Rate and Rhythm: Normal rate and regular rhythm.     Heart sounds: Normal heart sounds. No murmur heard. Pulmonary:     Effort: Pulmonary effort is normal. No respiratory distress.     Breath sounds: Normal breath sounds. No wheezing.   Musculoskeletal:     Cervical back: Neck supple.   Skin:    General: Skin is warm and dry.   Neurological:     Mental Status: She is alert and oriented to person, place, and time.     Comments: + Trenda Shona Grebe maneuver bilaterally  Psychiatric:        Mood and Affect: Mood is anxious.        Behavior: Behavior normal.        Thought Content: Thought content normal.        Judgment: Judgment normal.      BP 93/62 (BP Location: Right Arm, Patient Position: Sitting, Cuff Size: Small)   Pulse 87   Temp 98.8 F (37.1 C) (Oral)   Resp 16   Ht 5' 4 (1.626 m)   Wt 130 lb (59 kg)   LMP 11/28/2009   SpO2 98%   BMI 22.31 kg/m  Wt Readings from Last 3 Encounters:  11/23/23 130 lb (59 kg)  11/16/23 126 lb (57.2 kg)  09/14/23 126 lb (57.2 kg)       Assessment & Plan:   Problem List Items Addressed This Visit       Unprioritized   Controlled diabetes mellitus type 2 with complications (HCC)   Continue farxiga  and onglyza.      Relevant Orders   HgB A1c   Benign paroxysmal positional vertigo due to bilateral vestibular disorder - Primary    Symptoms and ED work upsuggest anxiety-related palpitations.  Positional dizziness due to displaced otoliths.  - Prescribed meclizine for dizziness, advised on drowsiness and evening use. - Consider referral for physical therapy if dizziness persists or worsens. - Encouraged  smoking cessation and management of hypertension and hyperlipidemia.       Relevant Medications   meclizine (ANTIVERT) 25 MG tablet    I am having Breanah Saye start on meclizine and Veozah. I am also having her maintain her Xiidra , blood glucose meter kit and supplies, aspirin  EC, mupirocin  ointment, Accu-Chek Softclix Lancets, BD Pen Needle Nano U/F, Vitamin D3, Dexcom G6 Sensor, Dexcom G6 Receiver, busPIRone , clopidogrel , nitroGLYCERIN , oxyCODONE -acetaminophen , atorvastatin , cilostazol , dapagliflozin  propanediol, saxagliptin  HCl, Dexcom G6 Transmitter, metoprolol  succinate, isosorbide  mononitrate, ranolazine , Tresiba  FlexTouch, Accu-Chek Guide Test, PARoxetine , OLANZapine , cloNIDine  HCl, and clonazePAM .  Meds ordered this encounter  Medications   meclizine (ANTIVERT) 25 MG tablet    Sig: Take 1 tablet (25 mg total) by mouth 3 (three) times daily as needed for dizziness.    Dispense:  30 tablet    Refill:  0    Supervising Provider:   BLYTH, STACEY A [[id number]]   Fezolinetant (VEOZAH) 45 MG TABS  Sig: Take 1 tablet (45 mg total) by mouth daily at 6 (six) AM.    Supervising Provider:   DOMENICA BLACKBIRD A [phone]

## 2023-11-24 ENCOUNTER — Encounter: Payer: Self-pay | Admitting: Family

## 2023-11-24 DIAGNOSIS — H8113 Benign paroxysmal vertigo, bilateral: Secondary | ICD-10-CM | POA: Insufficient documentation

## 2023-11-24 HISTORY — DX: Benign paroxysmal vertigo, bilateral: H81.13

## 2023-11-24 MED ORDER — VEOZAH 45 MG PO TABS: 1.0000 | ORAL_TABLET | Freq: Every day | ORAL | Status: DC

## 2023-11-24 NOTE — Assessment & Plan Note (Signed)
  Symptoms and ED work upsuggest anxiety-related palpitations.  Positional dizziness due to displaced otoliths.  - Prescribed meclizine for dizziness, advised on drowsiness and evening use. - Consider referral for physical therapy if dizziness persists or worsens. - Encouraged smoking cessation and management of hypertension and hyperlipidemia.

## 2023-11-24 NOTE — Assessment & Plan Note (Signed)
 Continue farxiga  and onglyza.

## 2023-11-24 NOTE — Patient Instructions (Signed)
 VISIT SUMMARY:  During your visit, we discussed your palpitations, dizziness, and recent swelling in your feet. We also reviewed your recent emergency room visit and your current medications.  YOUR PLAN:  PALPITATIONS AND DIZZINESS: Your palpitations are likely related to anxiety, and your dizziness may be due to positional changes. -I have prescribed meclizine for your dizziness. Please take it in the evening as it may cause drowsiness. -If your dizziness does not improve or gets worse, we may consider referring you to physical therapy. -It is important to stop smoking and manage your blood pressure and cholesterol levels to reduce your risk of heart issues.  PERIPHERAL EDEMA: The swelling in your feet is likely due to heat exposure. -No medication changes are needed at this time.  UTERINE PROLAPSE: You have swelling in your lower abdomen due to uterine prolapse. -We are waiting for the results of your ultrasound and have contacted your gynecologist for further evaluation.  GENERAL HEALTH MAINTENANCE: You are due for some routine health checks. -I have ordered an A1c test and scheduled a lab appointment for you. -Please schedule an eye exam with your ophthalmologist.  FOLLOW-UP: We need to monitor your symptoms and overall health. -Please schedule a follow-up appointment in three months unless your symptoms worsen, in which case, contact us  sooner.

## 2023-11-24 NOTE — Telephone Encounter (Signed)
 Forms received yesterday evening via e-mail, will be completed within 5 business days

## 2023-11-24 NOTE — Telephone Encounter (Signed)
 Forms received and printed. They should be completed in the next 5 business days

## 2023-11-28 MED ORDER — SAXAGLIPTIN HCL 2.5 MG PO TABS: 2.5000 mg | ORAL_TABLET | Freq: Every day | ORAL | 0 refills | Status: DC

## 2023-12-01 NOTE — Addendum Note (Signed)
 Addended by: WELLS LEVORN HERO on: 12/01/2023 04:43 PM   Modules accepted: Orders

## 2023-12-02 ENCOUNTER — Other Ambulatory Visit: Payer: Self-pay | Admitting: Family

## 2023-12-04 ENCOUNTER — Encounter: Payer: Self-pay | Admitting: Family

## 2023-12-05 ENCOUNTER — Ambulatory Visit: Payer: Self-pay | Admitting: Family

## 2023-12-05 ENCOUNTER — Other Ambulatory Visit (INDEPENDENT_AMBULATORY_CARE_PROVIDER_SITE_OTHER)

## 2023-12-05 ENCOUNTER — Other Ambulatory Visit

## 2023-12-05 DIAGNOSIS — Z794 Long term (current) use of insulin: Secondary | ICD-10-CM

## 2023-12-05 DIAGNOSIS — E118 Type 2 diabetes mellitus with unspecified complications: Secondary | ICD-10-CM

## 2023-12-05 LAB — HEMOGLOBIN A1C: Hgb A1c MFr Bld: 5.9 % (ref 4.6–6.5)

## 2023-12-06 ENCOUNTER — Ambulatory Visit: Admitting: Family

## 2023-12-06 ENCOUNTER — Encounter: Payer: Self-pay | Admitting: Family

## 2023-12-06 ENCOUNTER — Other Ambulatory Visit (HOSPITAL_BASED_OUTPATIENT_CLINIC_OR_DEPARTMENT_OTHER): Payer: Self-pay

## 2023-12-06 VITALS — BP 110/70 | HR 83 | Temp 98.3°F | Wt 125.8 lb

## 2023-12-06 DIAGNOSIS — G43911 Migraine, unspecified, intractable, with status migrainosus: Secondary | ICD-10-CM

## 2023-12-06 DIAGNOSIS — Z794 Long term (current) use of insulin: Secondary | ICD-10-CM

## 2023-12-06 DIAGNOSIS — E010 Iodine-deficiency related diffuse (endemic) goiter: Secondary | ICD-10-CM | POA: Diagnosis not present

## 2023-12-06 DIAGNOSIS — Z7984 Long term (current) use of oral hypoglycemic drugs: Secondary | ICD-10-CM

## 2023-12-06 DIAGNOSIS — E118 Type 2 diabetes mellitus with unspecified complications: Secondary | ICD-10-CM | POA: Diagnosis not present

## 2023-12-06 MED ORDER — SUMATRIPTAN SUCCINATE 50 MG PO TABS
50.0000 mg | ORAL_TABLET | ORAL | 5 refills | Status: DC | PRN
  Filled 2023-12-06: qty 10, 30d supply, fill #0

## 2023-12-06 MED ORDER — KETOROLAC TROMETHAMINE 30 MG/ML IJ SOLN
30.0000 mg | Freq: Once | INTRAMUSCULAR | Status: AC
  Administered 2023-12-06: 30 mg via INTRAMUSCULAR

## 2023-12-06 MED ORDER — DAPAGLIFLOZIN PROPANEDIOL 10 MG PO TABS
10.0000 mg | ORAL_TABLET | Freq: Every day | ORAL | 1 refills | Status: AC
  Filled 2023-12-06 (×2): qty 30, 30d supply, fill #0
  Filled 2023-12-31: qty 30, 30d supply, fill #1
  Filled 2024-01-30: qty 30, 30d supply, fill #2
  Filled 2024-02-29 – 2024-07-05 (×3): qty 30, 30d supply, fill #3

## 2023-12-06 NOTE — Assessment & Plan Note (Signed)
 2018 thyroid  US  noted the following:  1. Enlarged and heterogeneous thyroid  gland containing multiple bilateral thyroid  nodules which are either benign in appearance, or of a size that does not meet criteria for either biopsy or dedicated imaging follow-up.  Thyroid  appears larger than I remember.  Will update US .

## 2023-12-06 NOTE — Assessment & Plan Note (Addendum)
  Severe  persistent migraine with right-sided facial and neck pain, nausea, photophobia, and phonophobia. Pain rated 10/10. Previous migraines occurred 4-5 years ago. OTC medications ineffective. - Administer Toradol  30mg  (renal dose) injection in clinic now. - Prescribe Imitrex  for PRN use/persistent headache.

## 2023-12-06 NOTE — Progress Notes (Signed)
 Subjective:     Patient ID: Bailey Greene, female    DOB: July 21, 1960, 63 y.o.   MRN: [id number]  Chief Complaint  Patient presents with   Headache    A few days, R side/neck. noticed more at night. Causes sleep disturbance. Pt has not taken anything to relieve sx.     HPI  Discussed the use of AI scribe software for clinical note transcription with the patient, who gave verbal consent to proceed.  History of Present Illness  Bailey Greene is a 63 year old female who presents with a severe headache.  She has experienced a severe headache since July 4th, lasting four days, described as a migraine with pressure on the right side of her face extending to her neck. The pain is rated as ten out of ten. She has a history of migraines but has not had one in four to five years. Advil and sinus medication have been ineffective, and she lacks rescue medication for migraines.  Symptoms include photophobia, requiring rest in a dark room, and she uses tissue to block air from entering her nose as it worsens the pain. She experiences changes in vision, needing to close her eyes until the headache subsides, along with nausea and decreased appetite.  She has been off Farxiga  for four days due to a supply issue, which she is attempting to resolve. No recent changes in vision aside from those associated with the headache. She confirms experiencing nausea and has not been eating well.     Health Maintenance Due  Topic Date Due   OPHTHALMOLOGY EXAM  02/26/2023   Cervical Cancer Screening (HPV/Pap Cotest)  03/12/2023   Fecal DNA (Cologuard)  08/15/2023    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   Qualifier: Diagnosis of  By: Korine Riggs     Diabetes mellitus type 2 in nonobese (HCC) 07/2016   Dyslipidemia     Headache(784.0)    History of pulmonary embolism 07/08/2020   HTN (hypertension)    Migraine    NSTEMI (non-ST elevated myocardial infarction) (HCC)    PAD (peripheral artery disease) (HCC)    Peripheral arterial disease (HCC)    Pulmonary embolus (HCC)    unprovoked    Past Surgical History:  Procedure Laterality Date   AORTOGRAM N/A 12/08/2022   Procedure: AORTOGRAM;  Surgeon: Magda Debby SAILOR, MD;  Location: [facility] OR;  Service: Vascular;  Laterality: N/A;   BUNIONECTOMY  08/2011   right foot   CARDIAC CATHETERIZATION  2007   severe native 3 v dz, all grafts patent (LIMA-LAD, RIMA-Diag, SVG-OM1-OM2, R radial-PDA)   CESAREAN SECTION  1984   CORONARY ARTERY BYPASS GRAFT  2006    Coronary artery bypass grafting x5 with a left  internal  mammary to the left anterior descending coronary artery.  Free right  internal mammary to the diagonal coronary artery, sequential reverse  saphenous vein graft to the first and second obtuse marginal, right  artery bypass to the posterior descending coronary artery with endo-vein harvesting.   ENDARTERECTOMY FEMORAL Right 12/08/2022   Procedure: RIGHT COMMON FEMORAL ENDARTERECTOMY;  Surgeon: Magda Debby SAILOR, MD;  Location: [facility] OR;  Service: Vascular;  Laterality: Right;   INSERTION OF ILIAC STENT Bilateral 12/08/2022   Procedure: INSERTION OF BILATERAL ILIAC KISSING STENTS USING VIABAHN VBX STENTS;  Surgeon: Magda Debby SAILOR, MD;  Location: [facility] OR;  Service: Vascular;  Laterality: Bilateral;   LEFT HEART CATH AND CORS/GRAFTS ANGIOGRAPHY N/A 05/05/2021   Procedure: LEFT HEART CATH AND CORS/GRAFTS ANGIOGRAPHY;  Surgeon: Anner Alm ORN, MD;  Location: [facility] INVASIVE CV LAB;  Service: Cardiovascular;  Laterality: N/A;   ULTRASOUND GUIDANCE FOR VASCULAR ACCESS Left 12/08/2022   Procedure: ULTRASOUND GUIDANCE FOR VASCULAR ACCESS OF LEFT FEMORAL ARTERY;  Surgeon: Magda Debby SAILOR, MD;  Location: [facility] OR;  Service: Vascular;  Laterality: Left;   UMBILICAL HERNIA REPAIR N/A  01/16/2021   Procedure: UMBILICAL  HERNIA REPAIR;  Surgeon: Dasie Leonor CROME, MD;  Location: [facility] OR;  Service: General;  Laterality: N/A;    Family History  Problem Relation Age of Onset   Lupus Mother    Heart attack Father    Diabetes Father    Hypertension Father    Diabetes Paternal Grandmother    Hypertension Paternal Grandmother    Stroke Neg Hx    Kidney disease Neg Hx    Hyperlipidemia Neg Hx    Sudden death Neg Hx     Social History   Socioeconomic History   Marital status: Legally Separated    Spouse name: Not on file   Number of children: Not on file   Years of education: Not on file   Highest education level: Some college, no degree  Occupational History   Occupation: Armed forces training and education officer  Tobacco Use   Smoking status: Former    Current packs/day: 0.00    Types: Cigarettes    Start date: 09/12/1996    Quit date: 09/12/2021    Years since quitting: 2.2   Smokeless tobacco: Never   Tobacco comments:    0.5 pack cigarettes  per month per patient   Vaping Use   Vaping status: Never Used  Substance and Sexual Activity   Alcohol use: No    Alcohol/week: 0.0 standard drinks of alcohol   Drug use: Never   Sexual activity: Yes    Partners: Male    Comment: married  Other Topics Concern   Not on file  Social History Narrative   Regular exercise:  3 days weeklyCaffeine    Use:  1 soda daily   One child biological daughter born in 67 and an adopted niece.[employer]- Engineer, technical sales   Married- may be divorcing.    Social Drivers of Health   Financial Resource Strain: Medium Risk (10/05/2022)   Overall Financial Resource Strain (CARDIA)    Difficulty of Paying Living Expenses: Somewhat hard  Food Insecurity: Food Insecurity Present (10/05/2022)   Hunger Vital Sign    Worried About Running Out of Food in the Last Year: Sometimes true    Ran Out of Food in the Last Year: Sometimes true  Transportation Needs: Unknown (05/03/2023)   PRAPARE -  Administrator, Civil Service (Medical): No    Lack of Transportation (Non-Medical): Not on file  Physical Activity: Insufficiently Active (10/05/2022)   Exercise Vital Sign    Days of Exercise per Week: 3 days    Minutes of Exercise per Session: 20 min  Stress: Stress Concern Present (10/05/2022)   [employer] of Occupational Health - Occupational Stress Questionnaire    Feeling of Stress : Rather much  Social Connections: Unknown (10/05/2022)   Social Connection and Isolation Panel    Frequency of Communication with Friends and Family: Twice a week    Frequency of Social Gatherings with Friends and Family:  Once a week    Attends Religious Services: Patient declined    Active Member of Clubs or Organizations: No    Attends Engineer, structural: Not on file    Marital Status: Married  Catering manager Violence: Not on file    Outpatient Medications Prior to Visit  Medication Sig Dispense Refill   ACCU-CHEK GUIDE TEST test strip USE UP TO FOUR TIMES DAILY AS DIRECTED 400 strip 6   Accu-Chek Softclix Lancets lancets USE UP TO FOUR TIMES DAILY AS DIRECTED 100 each 12   aspirin  81 MG EC tablet Take 1 tablet (81 mg total) by mouth daily. Swallow whole. 30 tablet 12   atorvastatin  (LIPITOR ) 80 MG tablet TAKE 1 TABLET BY MOUTH EVERY DAY 90 tablet 3   BD PEN NEEDLE NANO U/F 32G X 4 MM MISC USE AS DIRECTED 300 each 1   blood glucose meter kit and supplies KIT Dispense based on patient and insurance preference. Use up to four times daily as directed. (FOR ICD-9 250.00, 250.01). 1 each 0   busPIRone  (BUSPAR ) 30 MG tablet Take 30 mg by mouth daily.     Cholecalciferol (VITAMIN D3) 75 MCG (3000 UT) TABS Take 1 tablet by mouth daily at 6 (six) AM. 30 tablet    cilostazol  (PLETAL ) 100 MG tablet TAKE 1 TABLET BY MOUTH TWICE A DAY BEFORE MEALS 180 tablet 3   clonazePAM  (KLONOPIN ) 1 MG tablet Take 1 tablet (1 mg total) by mouth 2 (two) times daily. 60 tablet 1   cloNIDine  HCl  (KAPVAY ) 0.1 MG TB12 ER tablet Take 1 tablet (0.1 mg total) by mouth 2 (two) times daily. 180 tablet 0   clopidogrel  (PLAVIX ) 75 MG tablet TAKE 1 TABLET BY MOUTH EVERY DAY WITH BREAKFAST 90 tablet 3   Continuous Glucose Receiver (DEXCOM G6 RECEIVER) DEVI Use as directed 1 each 0   Continuous Glucose Sensor (DEXCOM G6 SENSOR) MISC USE AS DIRECTED 3 each 1   Continuous Glucose Transmitter (DEXCOM G6 TRANSMITTER) MISC USE AS DIRECTED 1 each 1   Fezolinetant  (VEOZAH ) 45 MG TABS Take 1 tablet (45 mg total) by mouth daily at 6 (six) AM.     insulin  degludec (TRESIBA  FLEXTOUCH) 100 UNIT/ML FlexTouch Pen INJECT 12 UNITS INTO THE SKIN DAILY. 3 mL 5   isosorbide  mononitrate (IMDUR ) 120 MG 24 hr tablet TAKE 1 TABLET BY MOUTH EVERY DAY 90 tablet 1   meclizine  (ANTIVERT ) 25 MG tablet Take 1 tablet (25 mg total) by mouth 3 (three) times daily as needed for dizziness. 30 tablet 0   metoprolol  succinate (TOPROL -XL) 25 MG 24 hr tablet Take 0.5 tablets (12.5 mg total) by mouth daily.     mupirocin  ointment (BACTROBAN ) 2 % Apply 1 application. topically 2 (two) times daily. 30 g 2   nitroGLYCERIN  (NITROSTAT ) 0.4 MG SL tablet PLACE 1 TABLET UNDER THE TONGUE EVERY 5 MINUTES AS NEEDED FOR CHEST PAIN. MAX 3. CALL 911 25 tablet 3   OLANZapine  (ZYPREXA ) 15 MG tablet Take 1 tablet (15 mg total) by mouth at bedtime. 90 tablet 0   oxyCODONE -acetaminophen  (PERCOCET/ROXICET) 5-325 MG tablet Take 1-2 tablets by mouth every 4 (four) hours as needed for moderate pain. 30 tablet 0   PARoxetine  (PAXIL ) 40 MG tablet Take 1 tablet (40 mg total) by mouth 2 (two) times daily. 180 tablet 0   ranolazine  (RANEXA ) 500 MG 12 hr tablet TAKE 1 TABLET BY MOUTH TWICE A DAY 180 tablet 0   saxagliptin  HCl (ONGLYZA) 2.5 MG TABS  tablet Take 1 tablet (2.5 mg total) by mouth daily. 90 tablet 0   XIIDRA  5 % SOLN Place 1 drop into both eyes in the morning and at bedtime.     FARXIGA  10 MG TABS tablet TAKE 1 TABLET BY MOUTH DAILY BEFORE BREAKFAST. 90  tablet 1   No facility-administered medications prior to visit.    Allergies  Allergen Reactions   Metformin  And Related Nausea And Vomiting    ROS See HPI    Objective:    Physical Exam Constitutional:      General: She is not in acute distress.    Appearance: Normal appearance. She is well-developed.  HENT:     Head: Normocephalic and atraumatic.     Right Ear: External ear normal.     Left Ear: External ear normal.  Eyes:     General: No scleral icterus. Neck:     Thyroid : Thyromegaly (bilateral R>L) present.  Cardiovascular:     Rate and Rhythm: Normal rate and regular rhythm.     Heart sounds: Normal heart sounds. No murmur heard. Pulmonary:     Effort: Pulmonary effort is normal. No respiratory distress.     Breath sounds: Normal breath sounds. No wheezing.  Musculoskeletal:     Cervical back: Neck supple.  Skin:    General: Skin is warm and dry.  Neurological:     Mental Status: She is alert and oriented to person, place, and time.     Cranial Nerves: No cranial nerve deficit or facial asymmetry.     Motor: Motor function is intact. No weakness.  Psychiatric:        Mood and Affect: Mood normal.        Behavior: Behavior normal.        Thought Content: Thought content normal.        Judgment: Judgment normal.      BP 110/70   Pulse 83   Temp 98.3 F (36.8 C)   Wt 125 lb 12.8 oz (57.1 kg)   LMP 11/28/2009   SpO2 99%   BMI 21.59 kg/m  Wt Readings from Last 3 Encounters:  12/06/23 125 lb 12.8 oz (57.1 kg)  11/23/23 130 lb (59 kg)  11/16/23 126 lb (57.2 kg)       Assessment & Plan:   Problem List Items Addressed This Visit       Unprioritized   Thyromegaly   2018 thyroid  US  noted the following:  1. Enlarged and heterogeneous thyroid  gland containing multiple bilateral thyroid  nodules which are either benign in appearance, or of a size that does not meet criteria for either biopsy or dedicated imaging follow-up.  Thyroid  appears larger  than I remember.  Will update US .      Relevant Orders   US  THYROID    Intractable migraine with status migrainosus    Severe  persistent migraine with right-sided facial and neck pain, nausea, photophobia, and phonophobia. Pain rated 10/10. Previous migraines occurred 4-5 years ago. OTC medications ineffective. - Administer Toradol  30mg  (renal dose) injection in clinic now. - Prescribe Imitrex  for PRN use/persistent headache.      Relevant Medications   SUMAtriptan  (IMITREX ) 50 MG tablet   Controlled diabetes mellitus type 2 with complications (HCC) - Primary   Having trouble filling farxiga  at her pharmacy. Will send to our pharmacy downstairs instead. Follow up A1C looks great.  Lab Results  Component Value Date   HGBA1C 5.9 12/05/2023   HGBA1C 6.8 (H) 08/15/2023   HGBA1C 6.7 (  H) 05/03/2023   Lab Results  Component Value Date   MICROALBUR <0.7 08/15/2023   LDLCALC 70 10/05/2022   CREATININE 1.18 (H) 11/17/2023         Relevant Medications   dapagliflozin  propanediol (FARXIGA ) 10 MG TABS tablet    I have changed Bailey Greene's Farxiga  to dapagliflozin  propanediol. I am also having her start on SUMAtriptan . Additionally, I am having her maintain her Xiidra , blood glucose meter kit and supplies, aspirin  EC, mupirocin  ointment, Accu-Chek Softclix Lancets, BD Pen Needle Nano U/F, Vitamin D3, Dexcom G6 Sensor, Dexcom G6 Receiver, busPIRone , clopidogrel , nitroGLYCERIN , oxyCODONE -acetaminophen , atorvastatin , cilostazol , Dexcom G6 Transmitter, metoprolol  succinate, isosorbide  mononitrate, ranolazine , Tresiba  FlexTouch, Accu-Chek Guide Test, PARoxetine , OLANZapine , cloNIDine  HCl, clonazePAM , meclizine , Veozah , and saxagliptin  HCl. We administered ketorolac .  Meds ordered this encounter  Medications   SUMAtriptan  (IMITREX ) 50 MG tablet    Sig: Take 1 tablet (50 mg total) by mouth every 2 (two) hours as needed. May repeat in 2 hours if headache persists or recurs.    Dispense:   10 tablet    Refill:  5    Supervising Provider:   DOMENICA BLACKBIRD A [[id number]]   dapagliflozin  propanediol (FARXIGA ) 10 MG TABS tablet    Sig: Take 1 tablet (10 mg total) by mouth daily before breakfast.    Dispense:  90 tablet    Refill:  1    Supervising Provider:   DOMENICA BLACKBIRD A [[id number]]   ketorolac  (TORADOL ) 30 MG/ML injection 30 mg

## 2023-12-06 NOTE — Assessment & Plan Note (Addendum)
 Having trouble filling farxiga  at her pharmacy. Will send to our pharmacy downstairs instead. Follow up A1C looks great.  Lab Results  Component Value Date   HGBA1C 5.9 12/05/2023   HGBA1C 6.8 (H) 08/15/2023   HGBA1C 6.7 (H) 05/03/2023   Lab Results  Component Value Date   MICROALBUR <0.7 08/15/2023   LDLCALC 70 10/05/2022   CREATININE 1.18 (H) 11/17/2023

## 2023-12-06 NOTE — Patient Instructions (Addendum)
 VISIT SUMMARY:  Today, you were seen for a severe migraine that has lasted for four days. We also addressed your type 2 diabetes management and noted an enlargement in your thyroid .  YOUR PLAN:  MIGRAINE: You have a severe migraine with right-sided facial and neck pain, nausea, sensitivity to light and sound, and vision changes. -You received a Toradol  injection today to help with the pain. -I have prescribed Imitrex  for you to take if the headache persists.  TYPE 2 DIABETES MELLITUS: You have been off your diabetes medication, Farxiga , for four days due to a supply issue. -I have sent a prescription for Farxiga  to the hospital-based pharmacy downstairs to ensure you can resume your medication.  THYROID  ENLARGEMENT: We noted an enlargement in your thyroid , which was last checked in 2018. -I have ordered a thyroid  ultrasound to assess for any nodules or cysts.

## 2023-12-10 ENCOUNTER — Ambulatory Visit (HOSPITAL_BASED_OUTPATIENT_CLINIC_OR_DEPARTMENT_OTHER)
Admission: RE | Admit: 2023-12-10 | Discharge: 2023-12-10 | Disposition: A | Discharge status: Home / Self Care | Source: Ambulatory Visit | Attending: Family | Admitting: Family

## 2023-12-10 DIAGNOSIS — E010 Iodine-deficiency related diffuse (endemic) goiter: Secondary | ICD-10-CM | POA: Diagnosis present

## 2023-12-12 ENCOUNTER — Ambulatory Visit: Payer: Self-pay | Admitting: Family

## 2023-12-12 ENCOUNTER — Encounter: Payer: Self-pay | Admitting: Family

## 2023-12-12 DIAGNOSIS — G43909 Migraine, unspecified, not intractable, without status migrainosus: Secondary | ICD-10-CM

## 2023-12-12 MED ORDER — NURTEC 75 MG PO TBDP
1.0000 | ORAL_TABLET | ORAL | 2 refills | Status: AC
Start: 2023-12-12 — End: ?

## 2023-12-13 ENCOUNTER — Encounter: Payer: Self-pay | Admitting: Neurology

## 2023-12-14 ENCOUNTER — Ambulatory Visit: Admitting: Family

## 2023-12-15 ENCOUNTER — Encounter: Payer: Self-pay | Admitting: Family

## 2023-12-15 ENCOUNTER — Telehealth: Payer: Self-pay | Admitting: Family

## 2023-12-15 DIAGNOSIS — Z0279 Encounter for issue of other medical certificate: Secondary | ICD-10-CM

## 2023-12-15 NOTE — Telephone Encounter (Signed)
 Patient notified FMLA has been received

## 2023-12-15 NOTE — Telephone Encounter (Signed)
 Copied from CRM [id number]. Topic: General - Other >> Dec 15, 2023  8:53 AM Bailey Greene wrote: Reason for CRM: Patient called in wanting to know if FMLA paperwork was received, would like for someone to give her a callback or send her message if received

## 2023-12-19 ENCOUNTER — Ambulatory Visit (HOSPITAL_BASED_OUTPATIENT_CLINIC_OR_DEPARTMENT_OTHER)
Admission: RE | Admit: 2023-12-19 | Discharge: 2023-12-19 | Disposition: A | Discharge status: Home / Self Care | Source: Ambulatory Visit | Attending: Family | Admitting: Family

## 2023-12-19 DIAGNOSIS — G43909 Migraine, unspecified, not intractable, without status migrainosus: Secondary | ICD-10-CM | POA: Insufficient documentation

## 2023-12-20 ENCOUNTER — Ambulatory Visit (HOSPITAL_COMMUNITY)
Admission: RE | Admit: 2023-12-20 | Discharge: 2023-12-20 | Disposition: A | Discharge status: Home / Self Care | Payer: 59 | Source: Ambulatory Visit | Attending: Vascular Surgery | Admitting: Vascular Surgery

## 2023-12-20 ENCOUNTER — Ambulatory Visit (HOSPITAL_BASED_OUTPATIENT_CLINIC_OR_DEPARTMENT_OTHER)
Admission: RE | Admit: 2023-12-20 | Discharge: 2023-12-20 | Disposition: A | Discharge status: Home / Self Care | Payer: 59 | Source: Ambulatory Visit | Attending: Vascular Surgery | Admitting: Vascular Surgery

## 2023-12-20 ENCOUNTER — Ambulatory Visit: Payer: 59 | Attending: Vascular Surgery | Admitting: Physician Assistant

## 2023-12-20 VITALS — BP 101/65 | HR 79 | Temp 98.0°F | Ht 64.0 in | Wt 125.0 lb

## 2023-12-20 DIAGNOSIS — I70213 Atherosclerosis of native arteries of extremities with intermittent claudication, bilateral legs: Secondary | ICD-10-CM | POA: Diagnosis present

## 2023-12-20 DIAGNOSIS — I739 Peripheral vascular disease, unspecified: Secondary | ICD-10-CM

## 2023-12-20 LAB — VAS US ABI WITH/WO TBI
Left ABI: 0.89
Right ABI: 0.6

## 2023-12-20 NOTE — Progress Notes (Signed)
 Office Note     CC:  follow up Requesting Provider:  Daryl Setter, NP  HPI: Bailey Greene is a 63 y.o. (Aug 12, 1960) female who presents for routine follow up of PAD. She is s/p right common femoral endarterectomy and bilateral common iliac artery stenting on 12/08/2022 by Dr. Magda.  This was done for lifestyle limiting claudication of bilateral lower extremities. At her last visit in January she still had some mild claudication symptoms but overall was doing well.   Today she reports overall doing well. She does report decrease in her activity level over past several weeks due to Migraines. She had been walking prior to that without issues. She says occasionally her legs get sore at the end of the day but she denies any pain on walking or at rest. No tissue loss.   The pt is a statin for cholesterol management.    The pt is an aspirin .    Other AC:  Pletal , Plavix  The pt is on BB for hypertension.  The pt does have diabetes. Tobacco hx:  Former, quit 2023  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   Qualifier: Diagnosis of  By: Gwenneth Riggs     Diabetes mellitus type 2 in nonobese (HCC) 07/2016   Dyslipidemia    Headache(784.0)    History of pulmonary embolism 07/08/2020   HTN (hypertension)    Migraine    NSTEMI (non-ST elevated myocardial infarction) (HCC)    PAD (peripheral artery disease) (HCC)    Peripheral arterial disease (HCC)    Pulmonary embolus (HCC)    unprovoked    Past Surgical History:  Procedure Laterality Date   AORTOGRAM N/A 12/08/2022   Procedure: AORTOGRAM;  Surgeon: Magda Debby SAILOR, MD;  Location: [facility] OR;  Service: Vascular;  Laterality: N/A;   BUNIONECTOMY  08/2011   right foot   CARDIAC CATHETERIZATION  2007   severe native 3 v dz, all grafts patent (LIMA-LAD, RIMA-Diag,  SVG-OM1-OM2, R radial-PDA)   CESAREAN SECTION  1984   CORONARY ARTERY BYPASS GRAFT  2006    Coronary artery bypass grafting x5 with a left  internal  mammary to the left anterior descending coronary artery.  Free right  internal mammary to the diagonal coronary artery, sequential reverse  saphenous vein graft to the first and second obtuse marginal, right  artery bypass to the posterior descending coronary artery with endo-vein harvesting.   ENDARTERECTOMY FEMORAL Right 12/08/2022   Procedure: RIGHT COMMON FEMORAL ENDARTERECTOMY;  Surgeon: Magda Debby SAILOR, MD;  Location: [facility] OR;  Service: Vascular;  Laterality: Right;   INSERTION OF ILIAC STENT Bilateral 12/08/2022   Procedure: INSERTION OF BILATERAL ILIAC KISSING STENTS USING VIABAHN VBX STENTS;  Surgeon: Magda Debby SAILOR, MD;  Location: [facility] OR;  Service: Vascular;  Laterality: Bilateral;   LEFT HEART CATH AND CORS/GRAFTS ANGIOGRAPHY N/A 05/05/2021   Procedure: LEFT HEART CATH AND CORS/GRAFTS ANGIOGRAPHY;  Surgeon: Anner Alm ORN, MD;  Location: [facility] INVASIVE CV LAB;  Service: Cardiovascular;  Laterality: N/A;   ULTRASOUND GUIDANCE FOR VASCULAR ACCESS Left 12/08/2022   Procedure: ULTRASOUND GUIDANCE FOR VASCULAR ACCESS OF LEFT FEMORAL ARTERY;  Surgeon: Magda Debby SAILOR, MD;  Location: [facility] OR;  Service: Vascular;  Laterality: Left;   UMBILICAL HERNIA REPAIR N/A 01/16/2021   Procedure: UMBILICAL  HERNIA REPAIR;  Surgeon: Dasie Leonor CROME,  MD;  Location: [facility] OR;  Service: General;  Laterality: N/A;    Social History   Socioeconomic History   Marital status: Legally Separated    Spouse name: Not on file   Number of children: Not on file   Years of education: Not on file   Highest education level: Some college, no degree  Occupational History   Occupation: Armed forces training and education officer  Tobacco Use   Smoking status: Former    Current packs/day: 0.00    Types: Cigarettes    Start date: 09/12/1996    Quit date: 09/12/2021    Years since quitting: 2.2    Smokeless tobacco: Never   Tobacco comments:    0.5 pack cigarettes  per month per patient   Vaping Use   Vaping status: Never Used  Substance and Sexual Activity   Alcohol use: No    Alcohol/week: 0.0 standard drinks of alcohol   Drug use: Never   Sexual activity: Yes    Partners: Male    Comment: married  Other Topics Concern   Not on file  Social History Narrative   Regular exercise:  3 days weeklyCaffeine    Use:  1 soda daily   One child biological daughter born in 49 and an adopted niece.[employer]- Engineer, technical sales   Married- may be divorcing.    Social Drivers of Health   Financial Resource Strain: Medium Risk (10/05/2022)   Overall Financial Resource Strain (CARDIA)    Difficulty of Paying Living Expenses: Somewhat hard  Food Insecurity: Food Insecurity Present (10/05/2022)   Hunger Vital Sign    Worried About Running Out of Food in the Last Year: Sometimes true    Ran Out of Food in the Last Year: Sometimes true  Transportation Needs: Unknown (05/03/2023)   PRAPARE - Administrator, Civil Service (Medical): No    Lack of Transportation (Non-Medical): Not on file  Physical Activity: Insufficiently Active (10/05/2022)   Exercise Vital Sign    Days of Exercise per Week: 3 days    Minutes of Exercise per Session: 20 min  Stress: Stress Concern Present (10/05/2022)   [employer] of Occupational Health - Occupational Stress Questionnaire    Feeling of Stress : Rather much  Social Connections: Unknown (10/05/2022)   Social Connection and Isolation Panel    Frequency of Communication with Friends and Family: Twice a week    Frequency of Social Gatherings with Friends and Family: Once a week    Attends Religious Services: Patient declined    Database administrator or Organizations: No    Attends Engineer, structural: Not on file    Marital Status: Married  Catering manager Violence: Not on file    Family History  Problem Relation Age of  Onset   Lupus Mother    Heart attack Father    Diabetes Father    Hypertension Father    Diabetes Paternal Grandmother    Hypertension Paternal Grandmother    Stroke Neg Hx    Kidney disease Neg Hx    Hyperlipidemia Neg Hx    Sudden death Neg Hx     Current Outpatient Medications  Medication Sig Dispense Refill   ACCU-CHEK GUIDE TEST test strip USE UP TO FOUR TIMES DAILY AS DIRECTED 400 strip 6   Accu-Chek Softclix Lancets lancets USE UP TO FOUR TIMES DAILY AS DIRECTED 100 each 12   aspirin  81 MG EC tablet Take 1 tablet (81 mg total) by mouth daily. Swallow  whole. 30 tablet 12   atorvastatin  (LIPITOR ) 80 MG tablet TAKE 1 TABLET BY MOUTH EVERY DAY 90 tablet 3   BD PEN NEEDLE NANO U/F 32G X 4 MM MISC USE AS DIRECTED 300 each 1   blood glucose meter kit and supplies KIT Dispense based on patient and insurance preference. Use up to four times daily as directed. (FOR ICD-9 250.00, 250.01). 1 each 0   busPIRone  (BUSPAR ) 30 MG tablet Take 30 mg by mouth daily.     Cholecalciferol (VITAMIN D3) 75 MCG (3000 UT) TABS Take 1 tablet by mouth daily at 6 (six) AM. 30 tablet    cilostazol  (PLETAL ) 100 MG tablet TAKE 1 TABLET BY MOUTH TWICE A DAY BEFORE MEALS 180 tablet 3   clonazePAM  (KLONOPIN ) 1 MG tablet Take 1 tablet (1 mg total) by mouth 2 (two) times daily. 60 tablet 1   cloNIDine  HCl (KAPVAY ) 0.1 MG TB12 ER tablet Take 1 tablet (0.1 mg total) by mouth 2 (two) times daily. 180 tablet 0   clopidogrel  (PLAVIX ) 75 MG tablet TAKE 1 TABLET BY MOUTH EVERY DAY WITH BREAKFAST 90 tablet 3   Continuous Glucose Receiver (DEXCOM G6 RECEIVER) DEVI Use as directed 1 each 0   Continuous Glucose Sensor (DEXCOM G6 SENSOR) MISC USE AS DIRECTED 3 each 1   Continuous Glucose Transmitter (DEXCOM G6 TRANSMITTER) MISC USE AS DIRECTED 1 each 1   dapagliflozin  propanediol (FARXIGA ) 10 MG TABS tablet Take 1 tablet (10 mg total) by mouth daily before breakfast. 90 tablet 1   Fezolinetant  (VEOZAH ) 45 MG TABS Take 1 tablet  (45 mg total) by mouth daily at 6 (six) AM.     insulin  degludec (TRESIBA  FLEXTOUCH) 100 UNIT/ML FlexTouch Pen INJECT 12 UNITS INTO THE SKIN DAILY. 3 mL 5   isosorbide  mononitrate (IMDUR ) 120 MG 24 hr tablet TAKE 1 TABLET BY MOUTH EVERY DAY 90 tablet 1   meclizine  (ANTIVERT ) 25 MG tablet Take 1 tablet (25 mg total) by mouth 3 (three) times daily as needed for dizziness. 30 tablet 0   metoprolol  succinate (TOPROL -XL) 25 MG 24 hr tablet Take 0.5 tablets (12.5 mg total) by mouth daily.     mupirocin  ointment (BACTROBAN ) 2 % Apply 1 application. topically 2 (two) times daily. 30 g 2   nitroGLYCERIN  (NITROSTAT ) 0.4 MG SL tablet PLACE 1 TABLET UNDER THE TONGUE EVERY 5 MINUTES AS NEEDED FOR CHEST PAIN. MAX 3. CALL 911 25 tablet 3   OLANZapine  (ZYPREXA ) 15 MG tablet Take 1 tablet (15 mg total) by mouth at bedtime. 90 tablet 0   oxyCODONE -acetaminophen  (PERCOCET/ROXICET) 5-325 MG tablet Take 1-2 tablets by mouth every 4 (four) hours as needed for moderate pain. 30 tablet 0   PARoxetine  (PAXIL ) 40 MG tablet Take 1 tablet (40 mg total) by mouth 2 (two) times daily. 180 tablet 0   ranolazine  (RANEXA ) 500 MG 12 hr tablet TAKE 1 TABLET BY MOUTH TWICE A DAY 180 tablet 0   Rimegepant Sulfate (NURTEC) 75 MG TBDP Take 1 tablet (75 mg total) by mouth every other day. 30 tablet 2   saxagliptin  HCl (ONGLYZA) 2.5 MG TABS tablet Take 1 tablet (2.5 mg total) by mouth daily. 90 tablet 0   SUMAtriptan  (IMITREX ) 50 MG tablet Take 1 tablet (50 mg total) by mouth every 2 (two) hours as needed. May repeat in 2 hours if headache persists or recurs. 10 tablet 5   XIIDRA  5 % SOLN Place 1 drop into both eyes in the morning and at bedtime.  No current facility-administered medications for this visit.    Allergies  Allergen Reactions   Metformin  And Related Nausea And Vomiting     REVIEW OF SYSTEMS:  [X]  denotes positive finding, [ ]  denotes negative finding Cardiac  Comments:  Chest pain or chest pressure:    Shortness  of breath upon exertion:    Short of breath when lying flat:    Irregular heart rhythm:        Vascular    Pain in calf, thigh, or hip brought on by ambulation:    Pain in feet at night that wakes you up from your sleep:     Blood clot in your veins:    Leg swelling:         Pulmonary    Oxygen at home:    Productive cough:     Wheezing:         Neurologic    Sudden weakness in arms or legs:     Sudden numbness in arms or legs:     Sudden onset of difficulty speaking or slurred speech:    Temporary loss of vision in one eye:     Problems with dizziness:         Gastrointestinal    Blood in stool:     Vomited blood:         Genitourinary    Burning when urinating:     Blood in urine:        Psychiatric    Major depression:         Hematologic    Bleeding problems:    Problems with blood clotting too easily:        Skin    Rashes or ulcers:        Constitutional    Fever or chills:      PHYSICAL EXAMINATION:  Vitals:   12/20/23 0854  BP: 101/65  Pulse: 79  Temp: 98 F (36.7 C)  SpO2: 98%  Weight: 125 lb (56.7 kg)  Height: 5' 4 (1.626 m)    General:  WDWN in NAD; vital signs documented above Gait: Normal HENT: WNL, normocephalic Pulmonary: normal non-labored breathing Cardiac: regular HR Abdomen: soft Vascular Exam/Pulses: 2+ femoral, 2+ left DP, no palpable pulses on right. Feet warm and well perfused Extremities: without ischemic changes, without Gangrene , without cellulitis; without open wounds;  Musculoskeletal: no muscle wasting or atrophy  Neurologic: A&O X 3 Psychiatric:  The pt has normal affect.   Non-Invasive Vascular Imaging:   +-------+-----------+-----------+------------+------------+  ABI/TBIToday's ABIToday's TBIPrevious ABIPrevious TBI  +-------+-----------+-----------+------------+------------+  Right 0.60       0.45       0.75        0.48          +-------+-----------+-----------+------------+------------+  Left   0.89       0.63       1.05        0.70          +-------+-----------+-----------+------------+------------+  Bilateral ABIs appear decreased compared to prior study on 06/07/23.   VAS US  Aorta/ IVC/Iliacs: Summary:  Stenosis:  Patent bilateral CIA stent without evidence of stenosis.   ASSESSMENT/PLAN:: 63 y.o. female here for follow up for PAD. She is s/p right common femoral endarterectomy and bilateral common iliac artery stenting on 12/08/2022 by Dr. Magda.  Overall doing well at this time. No claudication, rest pain or tissue loss.  - ABI's today decreased slightly from her last visit - Duplex shows patent  bilateral CIA stents - Continue Aspirin , Statin, Plavix  and Pletal  - Encourage walking regimen once she is able to get some improvement from her migraines - She will follow up again in 6 months with ABI and Aorto/ IVC/ Iliac duplex   Teretha Damme, PA-C [facility] [phone]  Clinic MD:   Tomie Gaskins

## 2023-12-21 ENCOUNTER — Encounter: Payer: Self-pay | Admitting: Psychiatry

## 2023-12-21 ENCOUNTER — Telehealth: Admitting: Psychiatry

## 2023-12-21 ENCOUNTER — Other Ambulatory Visit: Payer: Self-pay | Admitting: *Deleted

## 2023-12-21 DIAGNOSIS — F411 Generalized anxiety disorder: Secondary | ICD-10-CM | POA: Diagnosis not present

## 2023-12-21 DIAGNOSIS — I739 Peripheral vascular disease, unspecified: Secondary | ICD-10-CM

## 2023-12-21 DIAGNOSIS — I70213 Atherosclerosis of native arteries of extremities with intermittent claudication, bilateral legs: Secondary | ICD-10-CM

## 2023-12-21 DIAGNOSIS — F4001 Agoraphobia with panic disorder: Secondary | ICD-10-CM | POA: Diagnosis not present

## 2023-12-21 DIAGNOSIS — F5105 Insomnia due to other mental disorder: Secondary | ICD-10-CM | POA: Diagnosis not present

## 2023-12-21 DIAGNOSIS — G43011 Migraine without aura, intractable, with status migrainosus: Secondary | ICD-10-CM

## 2023-12-21 DIAGNOSIS — F3162 Bipolar disorder, current episode mixed, moderate: Secondary | ICD-10-CM

## 2023-12-21 MED ORDER — CLONAZEPAM 1 MG PO TABS: 1.0000 mg | ORAL_TABLET | Freq: Two times a day (BID) | ORAL | 1 refills | Status: DC

## 2023-12-21 MED ORDER — AMITRIPTYLINE HCL 25 MG PO TABS
25.0000 mg | ORAL_TABLET | Freq: Every day | ORAL | 1 refills | Status: DC
Start: 2023-12-21 — End: 2024-02-16

## 2023-12-21 NOTE — Progress Notes (Signed)
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 Financial Resource Strain (CARDIA)    Difficulty of Paying Living Expenses: Somewhat hard  Food Insecurity: Food Insecurity Present (10/05/2022)   Hunger Vital Sign    Worried About Running Out of Food in the Last Year: Sometimes true    Ran Out of Food in the Last Year: Sometimes true  Transportation Needs: Unknown (05/03/2023)   PRAPARE - Administrator, Civil Service (Medical): No    Lack of Transportation (Non-Medical): Not on file  Physical Activity: Insufficiently Active (10/05/2022)   Exercise Vital Sign    Days of Exercise per Week: 3 days    Minutes of Exercise per Session: 20 min  Stress: Stress Concern Present (10/05/2022)   [employer] of Occupational Health - Occupational Stress Questionnaire    Feeling of Stress : Rather much  Social Connections: Unknown (10/05/2022)   Social Connection and Isolation Panel    Frequency of Communication with Friends and Family: Twice a week    Frequency of Social Gatherings with Friends and Family: Once a week    Attends Religious Services: Patient declined    Database administrator or Organizations: No    Attends Engineer, structural: Not on file    Marital Status: Married  Careers information officer Violence: Not on file    Past Medical History, Surgical history, Social history, and Family history were reviewed and updated as appropriate.   Please see review of systems for further details on the patient's review from today.   Objective:  Physical Exam:  LMP 11/28/2009   Physical Exam Constitutional:      General: She is not in acute distress.    Appearance: She is well-developed.  Musculoskeletal:        General: No deformity.  Neurological:     Mental Status: She is alert and oriented to person, place, and time.     Coordination: Coordination normal.  Psychiatric:        Attention and Perception: She is attentive. She does not perceive auditory hallucinations.        Mood and Affect: Mood is anxious and depressed. Affect is blunt. Affect is not labile, angry or tearful.        Speech: Speech normal. Speech is not rapid and pressured or slurred.        Behavior: Behavior normal. Behavior is not agitated or slowed.        Thought Content: Thought content normal. Thought content is not paranoid or delusional. Thought content does not include homicidal or suicidal ideation.        Cognition and Memory: Cognition normal.        Judgment: Judgment normal.     Comments: Insight intact. No auditory or visual hallucinations. No delusions.  Improved anxiety , panic, depression. Not resolved. Hard to evaluate bc migraine flare for 3 weeks.       Lab Review:     Component Value Date/Time   NA 138 11/17/2023 0000   NA 141 07/23/2020 1300   K 3.5 11/17/2023 0000   CL 106 11/17/2023 0000   CO2 24 11/17/2023 0000   GLUCOSE 71 11/17/2023 0000   BUN 17 11/17/2023 0000   BUN 7 07/23/2020 1300   CREATININE 1.18 (H) 11/17/2023 0000   CREATININE 0.72 06/22/2012 0840   CALCIUM  9.5 11/17/2023 0000   PROT 6.9 11/30/2022 1351   PROT 6.6 03/29/2022 1504   ALBUMIN 3.9 11/30/2022 1351   ALBUMIN 4.6 03/29/2022 1504   AST 17 11/30/2022 1351   ALT 18 11/30/2022 1351   ALKPHOS  95 11/30/2022 1351   BILITOT 0.7 11/30/2022 1351   BILITOT 0.4 03/29/2022 1504   GFRNONAA 53 (L) 11/17/2023 0000   GFRNONAA >89 06/22/2012 0840   GFRAA 102 07/23/2020 1300   GFRAA >89 06/22/2012 0840       Component Value Date/Time   WBC 7.2 11/17/2023 0000   RBC 4.13 11/17/2023 0000   HGB 12.6 11/17/2023 0000   HCT 38.1 11/17/2023 0000   PLT 172 11/17/2023 0000   MCV 92.3 11/17/2023 0000   MCH 30.5 11/17/2023 0000   MCHC 33.1 11/17/2023 0000   RDW 14.2 11/17/2023 0000   LYMPHSABS 2.2 02/25/2022 0950   MONOABS 0.4 02/25/2022 0950   EOSABS 0.3 02/25/2022 0950   BASOSABS 0.0 02/25/2022 0950    No results found for: POCLITH, LITHIUM   No results found for: PHENYTOIN, PHENOBARB, VALPROATE, CBMZ   .res Assessment: Plan:    Bailey Greene was seen today for follow-up, anxiety and depression.  Diagnoses and all orders for this visit:  Panic disorder with agoraphobia -     clonazePAM  (KLONOPIN ) 1 MG tablet; Take 1 tablet (1 mg total) by mouth 2 (two) times daily.  Generalized anxiety disorder -     clonazePAM  (KLONOPIN ) 1 MG tablet; Take 1 tablet (1 mg total) by mouth 2 (two) times daily.  Moderate mixed bipolar I disorder (HCC)  Insomnia due to mental condition -     amitriptyline  (ELAVIL ) 25 MG tablet; Take 1 tablet (25  mg total) by mouth at bedtime.  Intractable migraine without aura and with status migrainosus -     amitriptyline  (ELAVIL ) 25 MG tablet; Take 1 tablet (25 mg total) by mouth at bedtime.     Chronic work stress  30 min VIDEO face to face time with patient was spent on counseling and coordination of care. We discussed the following:  Repeated bouts of STD DT anxiety and depression. Usually associated lately with increased work volume demands .  she is more anxious and depressed with panic lately affecting work.    DT hangover reduced olanzapine   15 mg 3 hours before HS.  Good benefit clonidine  for anxiety and irritability off label.  but  Duration too short with IR clonidine .   Switched to clonidine  ER 0.1 mg AM and PM..  Which is better.  More calm with H .  Dont' get as irritated.  But would like higher but can't today DT BP 103/68  Push fluids. She monitors BP  Continue high dose paroxetine  off label DT TRD and TR anxiety to 80 mg daily since early June 2024  Disc risk high dose.   Disc SE in detail and SSRI withdrawal sx.  Better  duration after switch to clonazepam  1 mg HS, 0.5 mg AM.  But hangover not better with reduction clonazepam  0.5 mg AM and 0.5-0.75 mg HS and anxiety not managed so increaseD to 1 mg BID.  Consider switch to sertraline .  Discussed potential metabolic side effects associated with atypical antipsychotics, as well as potential risk for movement side effects. Advised pt to contact office if movement side effects occur.  DM managed  We discussed the short-term risks associated with benzodiazepines including sedation and increased fall risk among others.  Discussed long-term side effect risk including dependence, potential withdrawal symptoms, and the potential eventual dose-related risk of dementia.  But recent studies from 2020 dispute this association between benzodiazepines and dementia risk. Newer studies in 2020 do not support an association with dementia. May have to pursue a change in BZ if clonidine  doesn't help.  H much better out of hospital.  Had RTW but very stressed with work volume and pressure.  Out now for migraine.  Trial amitriptyline  25 mg HS for both sleep and migraine.  Follow-up 2 mos  Lorene Macintosh MD, DFAPA  Please see After Visit Summary for patient specific instructions.  Lorene Macintosh, MD, DFAPA    Future Appointments  Date Time Provider Department Center  12/22/2023  3:40 PM Daryl Setter, NP [facility]  02/02/2024  9:10 AM Skeet Juliene SAUNDERS, DO [facility] None  02/15/2024  4:00 PM Daryl Setter, NP [facility]  02/23/2024 10:00 AM Zuleta, Kaitlin G, NP [facility]  [facility]  06/26/2024  9:00 AM [facility]  06/26/2024 10:00 AM [facility]  06/26/2024 10:30 AM [facility]    No orders of the defined types were placed in this encounter.      -------------------------------

## 2023-12-22 ENCOUNTER — Ambulatory Visit: Admitting: Family

## 2023-12-22 ENCOUNTER — Other Ambulatory Visit: Payer: Self-pay | Admitting: Family

## 2023-12-22 ENCOUNTER — Other Ambulatory Visit: Payer: Self-pay | Admitting: Cardiology

## 2023-12-22 VITALS — BP 104/65 | HR 85 | Temp 99.0°F | Resp 16 | Ht 64.0 in | Wt 125.0 lb

## 2023-12-22 DIAGNOSIS — G43009 Migraine without aura, not intractable, without status migrainosus: Secondary | ICD-10-CM | POA: Diagnosis not present

## 2023-12-22 DIAGNOSIS — E010 Iodine-deficiency related diffuse (endemic) goiter: Secondary | ICD-10-CM

## 2023-12-22 DIAGNOSIS — E119 Type 2 diabetes mellitus without complications: Secondary | ICD-10-CM

## 2023-12-22 NOTE — Assessment & Plan Note (Signed)
  Severe nocturnal headaches with pain level 9/10. Nurtec more effective than Imitrex . No obvious sleep apnea observed by family members. HA's are now intermittent. No headache at present.  - Discontinue Imitrex - especially given hx of CAD.  - Continue Nurtec every other day. - Maintain appointment with Dr. Skeet in Neurology on September 4th. -OK to return to work on 01/16/24.

## 2023-12-22 NOTE — Patient Instructions (Signed)
 VISIT SUMMARY:  During your visit, we discussed your chronic headaches, anxiety, thyroid  nodule, and hypertension. We made some adjustments to your medications and provided guidance on monitoring your conditions.  YOUR PLAN:  CHRONIC HEADACHE: You have severe headaches that typically occur at night and wake you up. Your pain level has been very high, but you are currently headache-free. -Stop using Imitrex . -Continue taking Nurtec every other day. -Keep your appointment with Dr. Skeet in Neurology on September 4th.  ANXIETY: You are experiencing increased anxiety and mood fluctuations, which may be related to your headaches and other life stressors. -We will provide follow-up letters for your work leave related to your headaches.  THYROID  NODULE: You have a 2.4 cm nodule on the right side of your thyroid . It is not currently worrisome. -Monitor the thyroid  nodule for any changes in size. -If the nodule appears to be enlarging, we will reimage it.  HYPERTENSION: Your blood pressure is well-controlled, and your medications are effectively managed. -Continue with your current medications and management plan.

## 2023-12-22 NOTE — Assessment & Plan Note (Signed)
 Right sided thyroid  nodule- does not meet criteria for biopsy/dedicated follow up on recent US .

## 2023-12-22 NOTE — Progress Notes (Signed)
 Subjective:     Patient ID: Bailey Greene, female    DOB: 03/27/1961, 63 y.o.   MRN: [id number]  Chief Complaint  Patient presents with   Migraine    Here for follow up    Migraine     Discussed the use of AI scribe software for clinical note transcription with the patient, who gave verbal consent to proceed.  History of Present Illness Bailey Greene is a 63 year old female with chronic headaches who presents with worsening headache symptoms.  Her headaches typically begin after she falls asleep, waking her up after about an hour. The pain is severe, previously reaching ten out of ten, with a recent episode at nine out of ten. Currently, she is headache-free.  Her husband has observed snoring during these episodes, but no apnea has been noted. She denies any apnea during sleep.  She uses Nurtec every other day, finding it more effective than Imitrex . A previous Toradol  injection provided relief.  She is on leave from work due to her headaches and is awaiting follow-up letters for her employer.  She has a thyroid  nodule on the right side, measuring 2.4 cm, which seems to enlarge during headache episodes.  She experiences increased anxiety and mood fluctuations, attributing it to having 'so much going on right now.'      Health Maintenance Due  Topic Date Due   COVID-19 Vaccine (7 - 2024-25 season) 01/30/2023   OPHTHALMOLOGY EXAM  02/26/2023   Fecal DNA (Cologuard)  08/15/2023    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   Qualifier: Diagnosis of  By: Brithany Riggs     Diabetes mellitus type 2 in nonobese (HCC) 07/2016   Dyslipidemia    Headache(784.0)    History of pulmonary embolism 07/08/2020   HTN (hypertension)    Migraine    NSTEMI (non-ST elevated myocardial infarction) (HCC)     PAD (peripheral artery disease) (HCC)    Peripheral arterial disease (HCC)    Pulmonary embolus (HCC)    unprovoked    Past Surgical History:  Procedure Laterality Date   AORTOGRAM N/A 12/08/2022   Procedure: AORTOGRAM;  Surgeon: Magda Debby SAILOR, MD;  Location: [facility] OR;  Service: Vascular;  Laterality: N/A;   BUNIONECTOMY  08/2011   right foot   CARDIAC CATHETERIZATION  2007   severe native 3 v dz, all grafts patent (LIMA-LAD, RIMA-Diag, SVG-OM1-OM2, R radial-PDA)   CESAREAN SECTION  1984   CORONARY ARTERY BYPASS GRAFT  2006    Coronary artery bypass grafting x5 with a left  internal  mammary to the left anterior descending coronary artery.  Free right  internal mammary to the diagonal coronary artery, sequential reverse  saphenous vein graft to the first and second obtuse marginal, right  artery bypass to the posterior descending coronary artery with endo-vein harvesting.   ENDARTERECTOMY FEMORAL Right 12/08/2022   Procedure: RIGHT COMMON FEMORAL ENDARTERECTOMY;  Surgeon: Magda Debby SAILOR, MD;  Location: [facility] OR;  Service: Vascular;  Laterality: Right;   INSERTION OF ILIAC STENT Bilateral 12/08/2022   Procedure: INSERTION OF BILATERAL ILIAC KISSING STENTS USING VIABAHN VBX STENTS;  Surgeon: Magda Debby SAILOR, MD;  Location: [facility] OR;  Service: Vascular;  Laterality: Bilateral;   LEFT HEART CATH AND CORS/GRAFTS ANGIOGRAPHY N/A 05/05/2021   Procedure: LEFT HEART CATH AND  CORS/GRAFTS ANGIOGRAPHY;  Surgeon: Anner Alm ORN, MD;  Location: [facility] INVASIVE CV LAB;  Service: Cardiovascular;  Laterality: N/A;   ULTRASOUND GUIDANCE FOR VASCULAR ACCESS Left 12/08/2022   Procedure: ULTRASOUND GUIDANCE FOR VASCULAR ACCESS OF LEFT FEMORAL ARTERY;  Surgeon: Magda Debby SAILOR, MD;  Location: [facility] OR;  Service: Vascular;  Laterality: Left;   UMBILICAL HERNIA REPAIR N/A 01/16/2021   Procedure: UMBILICAL  HERNIA REPAIR;  Surgeon: Dasie Leonor CROME, MD;  Location: [facility] OR;  Service: General;  Laterality: N/A;    Family History   Problem Relation Age of Onset   Lupus Mother    Heart attack Father    Diabetes Father    Hypertension Father    Diabetes Paternal Grandmother    Hypertension Paternal Grandmother    Stroke Neg Hx    Kidney disease Neg Hx    Hyperlipidemia Neg Hx    Sudden death Neg Hx     Social History   Socioeconomic History   Marital status: Legally Separated    Spouse name: Not on file   Number of children: Not on file   Years of education: Not on file   Highest education level: Some college, no degree  Occupational History   Occupation: Armed forces training and education officer  Tobacco Use   Smoking status: Former    Current packs/day: 0.00    Types: Cigarettes    Start date: 09/12/1996    Quit date: 09/12/2021    Years since quitting: 2.2   Smokeless tobacco: Never   Tobacco comments:    0.5 pack cigarettes  per month per patient   Vaping Use   Vaping status: Never Used  Substance and Sexual Activity   Alcohol use: No    Alcohol/week: 0.0 standard drinks of alcohol   Drug use: Never   Sexual activity: Yes    Partners: Male    Comment: married  Other Topics Concern   Not on file  Social History Narrative   Regular exercise:  3 days weeklyCaffeine    Use:  1 soda daily   One child biological daughter born in 69 and an adopted niece.[employer]- Engineer, technical sales   Married- may be divorcing.    Social Drivers of Health   Financial Resource Strain: Medium Risk (10/05/2022)   Overall Financial Resource Strain (CARDIA)    Difficulty of Paying Living Expenses: Somewhat hard  Food Insecurity: Food Insecurity Present (10/05/2022)   Hunger Vital Sign    Worried About Running Out of Food in the Last Year: Sometimes true    Ran Out of Food in the Last Year: Sometimes true  Transportation Needs: Unknown (05/03/2023)   PRAPARE - Administrator, Civil Service (Medical): No    Lack of Transportation (Non-Medical): Not on file  Physical Activity: Insufficiently Active (10/05/2022)    Exercise Vital Sign    Days of Exercise per Week: 3 days    Minutes of Exercise per Session: 20 min  Stress: Stress Concern Present (10/05/2022)   [employer] of Occupational Health - Occupational Stress Questionnaire    Feeling of Stress : Rather much  Social Connections: Unknown (10/05/2022)   Social Connection and Isolation Panel    Frequency of Communication with Friends and Family: Twice a week    Frequency of Social Gatherings with Friends and Family: Once a week    Attends Religious Services: Patient declined    Database administrator or Organizations: No    Attends Banker Meetings: Not on file  Marital Status: Married  Catering manager Violence: Not on file    Outpatient Medications Prior to Visit  Medication Sig Dispense Refill   ACCU-CHEK GUIDE TEST test strip USE UP TO FOUR TIMES DAILY AS DIRECTED 400 strip 6   Accu-Chek Softclix Lancets lancets USE UP TO FOUR TIMES DAILY AS DIRECTED 100 each 12   amitriptyline  (ELAVIL ) 25 MG tablet Take 1 tablet (25 mg total) by mouth at bedtime. 30 tablet 1   aspirin  81 MG EC tablet Take 1 tablet (81 mg total) by mouth daily. Swallow whole. 30 tablet 12   atorvastatin  (LIPITOR ) 80 MG tablet TAKE 1 TABLET BY MOUTH EVERY DAY 90 tablet 3   BD PEN NEEDLE NANO U/F 32G X 4 MM MISC USE AS DIRECTED 300 each 1   blood glucose meter kit and supplies KIT Dispense based on patient and insurance preference. Use up to four times daily as directed. (FOR ICD-9 250.00, 250.01). 1 each 0   busPIRone  (BUSPAR ) 30 MG tablet Take 30 mg by mouth daily.     Cholecalciferol (VITAMIN D3) 75 MCG (3000 UT) TABS Take 1 tablet by mouth daily at 6 (six) AM. 30 tablet    cilostazol  (PLETAL ) 100 MG tablet TAKE 1 TABLET BY MOUTH TWICE A DAY BEFORE MEALS 180 tablet 3   clonazePAM  (KLONOPIN ) 1 MG tablet Take 1 tablet (1 mg total) by mouth 2 (two) times daily. 60 tablet 1   cloNIDine  HCl (KAPVAY ) 0.1 MG TB12 ER tablet Take 1 tablet (0.1 mg total) by mouth 2  (two) times daily. 180 tablet 0   clopidogrel  (PLAVIX ) 75 MG tablet TAKE 1 TABLET BY MOUTH EVERY DAY WITH BREAKFAST 90 tablet 3   Continuous Glucose Receiver (DEXCOM G6 RECEIVER) DEVI Use as directed 1 each 0   Continuous Glucose Sensor (DEXCOM G6 SENSOR) MISC USE AS DIRECTED 3 each 1   Continuous Glucose Transmitter (DEXCOM G6 TRANSMITTER) MISC Check blood sugars as directed 1 each 1   dapagliflozin  propanediol (FARXIGA ) 10 MG TABS tablet Take 1 tablet (10 mg total) by mouth daily before breakfast. 90 tablet 1   Fezolinetant  (VEOZAH ) 45 MG TABS Take 1 tablet (45 mg total) by mouth daily at 6 (six) AM.     insulin  degludec (TRESIBA  FLEXTOUCH) 100 UNIT/ML FlexTouch Pen INJECT 12 UNITS INTO THE SKIN DAILY. 3 mL 5   isosorbide  mononitrate (IMDUR ) 120 MG 24 hr tablet TAKE 1 TABLET BY MOUTH EVERY DAY 90 tablet 1   meclizine  (ANTIVERT ) 25 MG tablet Take 1 tablet (25 mg total) by mouth 3 (three) times daily as needed for dizziness. 30 tablet 0   metoprolol  succinate (TOPROL -XL) 25 MG 24 hr tablet Take 0.5 tablets (12.5 mg total) by mouth daily.     mupirocin  ointment (BACTROBAN ) 2 % Apply 1 application. topically 2 (two) times daily. 30 g 2   nitroGLYCERIN  (NITROSTAT ) 0.4 MG SL tablet PLACE 1 TABLET UNDER THE TONGUE EVERY 5 MINUTES AS NEEDED FOR CHEST PAIN. MAX 3. CALL 911 25 tablet 3   OLANZapine  (ZYPREXA ) 15 MG tablet Take 1 tablet (15 mg total) by mouth at bedtime. 90 tablet 0   oxyCODONE -acetaminophen  (PERCOCET/ROXICET) 5-325 MG tablet Take 1-2 tablets by mouth every 4 (four) hours as needed for moderate pain. 30 tablet 0   PARoxetine  (PAXIL ) 40 MG tablet Take 1 tablet (40 mg total) by mouth 2 (two) times daily. 180 tablet 0   ranolazine  (RANEXA ) 500 MG 12 hr tablet TAKE 1 TABLET BY MOUTH TWICE A DAY 180  tablet 0   Rimegepant Sulfate (NURTEC) 75 MG TBDP Take 1 tablet (75 mg total) by mouth every other day. 30 tablet 2   saxagliptin  HCl (ONGLYZA) 2.5 MG TABS tablet Take 1 tablet (2.5 mg total) by mouth  daily. 90 tablet 0   XIIDRA  5 % SOLN Place 1 drop into both eyes in the morning and at bedtime.     SUMAtriptan  (IMITREX ) 50 MG tablet Take 1 tablet (50 mg total) by mouth every 2 (two) hours as needed. May repeat in 2 hours if headache persists or recurs. 10 tablet 5   No facility-administered medications prior to visit.    Allergies  Allergen Reactions   Metformin  And Related Nausea And Vomiting    ROS See HPI    Objective:    Physical Exam Constitutional:      General: She is not in acute distress.    Appearance: Normal appearance. She is well-developed.  HENT:     Head: Normocephalic and atraumatic.     Right Ear: External ear normal.     Left Ear: External ear normal.  Eyes:     General: No scleral icterus. Neck:     Thyroid : No thyromegaly.  Cardiovascular:     Rate and Rhythm: Normal rate and regular rhythm.     Heart sounds: Normal heart sounds. No murmur heard. Pulmonary:     Effort: Pulmonary effort is normal. No respiratory distress.     Breath sounds: Normal breath sounds. No wheezing.  Musculoskeletal:     Cervical back: Neck supple.  Skin:    General: Skin is warm and dry.  Neurological:     Mental Status: She is alert and oriented to person, place, and time.  Psychiatric:        Mood and Affect: Mood normal.        Behavior: Behavior normal.        Thought Content: Thought content normal.        Judgment: Judgment normal.      BP 104/65 (BP Location: Right Arm, Patient Position: Sitting, Cuff Size: Small)   Pulse 85   Temp 99 F (37.2 C) (Oral)   Resp 16   Ht 5' 4 (1.626 m)   Wt 125 lb (56.7 kg)   LMP 11/28/2009   SpO2 100%   BMI 21.46 kg/m  Wt Readings from Last 3 Encounters:  12/22/23 125 lb (56.7 kg)  12/20/23 125 lb (56.7 kg)  12/06/23 125 lb 12.8 oz (57.1 kg)       Assessment & Plan:   Problem List Items Addressed This Visit       Unprioritized   Migraine without aura - Primary    Severe nocturnal headaches with pain  level 9/10. Nurtec more effective than Imitrex . No obvious sleep apnea observed by family members. HA's are now intermittent. No headache at present.  - Discontinue Imitrex - especially given hx of CAD.  - Continue Nurtec every other day. - Maintain appointment with Dr. Skeet in Neurology on September 4th. -OK to return to work on 01/16/24.        I have discontinued Theda Benedicto's SUMAtriptan . I am also having her maintain her Xiidra , blood glucose meter kit and supplies, aspirin  EC, mupirocin  ointment, Accu-Chek Softclix Lancets, BD Pen Needle Nano U/F, Vitamin D3, Dexcom G6 Sensor, Dexcom G6 Receiver, busPIRone , clopidogrel , nitroGLYCERIN , oxyCODONE -acetaminophen , atorvastatin , cilostazol , metoprolol  succinate, isosorbide  mononitrate, ranolazine , Tresiba  FlexTouch, Accu-Chek Guide Test, PARoxetine , OLANZapine , cloNIDine  HCl, meclizine , Veozah , saxagliptin  HCl, dapagliflozin  propanediol, Nurtec, amitriptyline ,  clonazePAM , and Dexcom G6 Transmitter.  No orders of the defined types were placed in this encounter.

## 2023-12-23 ENCOUNTER — Ambulatory Visit: Payer: Self-pay | Admitting: Family

## 2023-12-27 ENCOUNTER — Ambulatory Visit: Admitting: Family

## 2024-01-02 ENCOUNTER — Other Ambulatory Visit (HOSPITAL_BASED_OUTPATIENT_CLINIC_OR_DEPARTMENT_OTHER): Payer: Self-pay

## 2024-01-02 ENCOUNTER — Other Ambulatory Visit: Payer: Self-pay | Admitting: Family

## 2024-01-03 ENCOUNTER — Other Ambulatory Visit: Payer: Self-pay | Admitting: Internal Medicine

## 2024-01-03 DIAGNOSIS — N1831 Chronic kidney disease, stage 3a: Secondary | ICD-10-CM

## 2024-01-11 ENCOUNTER — Encounter: Payer: Self-pay | Admitting: Family

## 2024-01-11 DIAGNOSIS — N1831 Chronic kidney disease, stage 3a: Secondary | ICD-10-CM

## 2024-01-13 ENCOUNTER — Ambulatory Visit
Admission: RE | Admit: 2024-01-13 | Discharge: 2024-01-13 | Disposition: A | Discharge status: Home / Self Care | Source: Ambulatory Visit | Attending: Internal Medicine | Admitting: Internal Medicine

## 2024-01-13 DIAGNOSIS — N1831 Chronic kidney disease, stage 3a: Secondary | ICD-10-CM

## 2024-01-14 ENCOUNTER — Other Ambulatory Visit: Payer: Self-pay | Admitting: Psychiatry

## 2024-01-14 DIAGNOSIS — G43011 Migraine without aura, intractable, with status migrainosus: Secondary | ICD-10-CM

## 2024-01-14 DIAGNOSIS — F5105 Insomnia due to other mental disorder: Secondary | ICD-10-CM

## 2024-01-15 ENCOUNTER — Other Ambulatory Visit: Payer: Self-pay | Admitting: Cardiology

## 2024-01-17 ENCOUNTER — Other Ambulatory Visit: Payer: Self-pay | Admitting: Psychiatry

## 2024-01-17 ENCOUNTER — Telehealth: Payer: Self-pay | Admitting: Neurology

## 2024-01-17 DIAGNOSIS — F4001 Agoraphobia with panic disorder: Secondary | ICD-10-CM

## 2024-01-17 DIAGNOSIS — F3162 Bipolar disorder, current episode mixed, moderate: Secondary | ICD-10-CM

## 2024-01-17 DIAGNOSIS — F5105 Insomnia due to other mental disorder: Secondary | ICD-10-CM

## 2024-01-17 DIAGNOSIS — F411 Generalized anxiety disorder: Secondary | ICD-10-CM

## 2024-01-17 NOTE — Telephone Encounter (Signed)
 Copied from CRM [id number]. Topic: Clinical - Lab/Test Results >> Jan 17, 2024  1:12 PM Dedra B wrote: Reason for CRM: Pt calling regarding US  results. Pls call pt.

## 2024-01-17 NOTE — Telephone Encounter (Signed)
 Patient reports she was contacted by pcp about this already

## 2024-01-17 NOTE — Telephone Encounter (Signed)
 Per patient she spoke to provider earlier today

## 2024-01-26 ENCOUNTER — Other Ambulatory Visit: Payer: Self-pay | Admitting: Psychiatry

## 2024-01-26 DIAGNOSIS — F4001 Agoraphobia with panic disorder: Secondary | ICD-10-CM

## 2024-01-26 DIAGNOSIS — F411 Generalized anxiety disorder: Secondary | ICD-10-CM

## 2024-02-01 NOTE — Progress Notes (Deleted)
 NEUROLOGY CONSULTATION NOTE  Bailey Greene MRN: [id number] DOB: Nov 30, 1960  Referring provider: Eleanor Ponto, NP Primary care provider: Eleanor Ponto, NP  Reason for consult:  migraines  Assessment/Plan:   ***   Subjective:  Bailey Greene is a 63 year old ***-handed female with CAD s/p NSTEMI and CABG, peripheral arterial disease, DM 2, dyslipidemia, HTN, Bipolar affective disorder, anxiety and history of PE who presents for migraines.  History supplemented by referring provider's note.  CT and MRI head personally reviewed.  Onset:  *** Location:  *** Quality:  *** Intensity:  ***.  Aura:  *** Prodrome:  *** Postdrome:  *** Associated symptoms:  ***.  *** denies associated unilateral numbness or weakness. Duration:  *** Frequency:  *** Frequency of abortive medication: *** Triggers:  *** Relieving factors:  *** Activity:  ***  12/19/2023 CT HEAD WO:  1. No acute intracranial abnormality. 2. Moderately advanced periventricular and scattered subcortical white matter hypoattenuation for age, similar to prior exam [10/17/2020]. 01/13/2006 MRI BRAIN W WO:  No acute intracranial abnormality.  No mass or hydrocephalus.  Multiple small white matter lesions involving the cerebral white matter and slight signal alteration of the pons as described above.   Past NSAIDS/analgesics:  tramadol , naproxen, meloxicam  Past abortive triptans:  sumatriptan  tab Past abortive ergotamine:  *** Past muscle relaxants:  Flexeril  Past anti-emetic:  Zofran  Past antihypertensive medications:  verapamil, lisinopril  Past antidepressant medications:  sertraline , duloxetine , mirtazapine  Past anticonvulsant medications:  topiramate , divalproex  Past anti-CGRP:  *** Past vitamins/Herbal/Supplements:  *** Past antihistamines/decongestants:  *** Other past therapies:  ***  Current NSAIDS/analgesics:  ASA 81mg  daily Current triptans:  none Current ergotamine:  none Current  anti-emetic:  none Current muscle relaxants:  none Current Antihypertensive medications:  metoprolol  succinate XL 25mg  daily, clonidine , Imdur  Current Antidepressant medications:  amitriptyline  25mg  at bedtime, paroxetine  40mg  BID Current Anticonvulsant medications:  none Current anti-CGRP:  Nurtec 75mg  every other day Current Vitamins/Herbal/Supplements:  none Current Antihistamines/Decongestants:  meclizine  Other therapy:  none Other medications:  Zyprexa , busipirone, clonazepam  1mg  BID, Plavix , Pletal , NTG   Caffeine:  *** Alcohol:  *** Smoker:  *** Diet:  *** Exercise:  *** Depression:  ***; Anxiety:  *** Other pain:  *** Sleep hygiene:  ***  History of TBI/concussion:  *** Family history of headache:  *** Family history of cerebral aneurysm:  ***      PAST MEDICAL HISTORY: 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   Qualifier: Diagnosis of  By: Lataisha Riggs     Diabetes mellitus type 2 in nonobese (HCC) 07/2016   Dyslipidemia    Headache(784.0)    History of pulmonary embolism 07/08/2020   HTN (hypertension)    Migraine    NSTEMI (non-ST elevated myocardial infarction) (HCC)    PAD (peripheral artery disease) (HCC)    Peripheral arterial disease (HCC)    Pulmonary embolus (HCC)    unprovoked    PAST SURGICAL HISTORY: Past Surgical History:  Procedure Laterality Date   AORTOGRAM N/A 12/08/2022   Procedure: AORTOGRAM;  Surgeon: Magda Debby SAILOR, MD;  Location: [facility] OR;  Service: Vascular;  Laterality: N/A;   BUNIONECTOMY  08/2011   right foot   CARDIAC CATHETERIZATION  2007   severe native 3 v dz, all grafts patent (LIMA-LAD, RIMA-Diag, SVG-OM1-OM2, R radial-PDA)   CESAREAN SECTION  1984   CORONARY ARTERY BYPASS GRAFT  2006    Coronary artery bypass grafting x5 with a left  internal   mammary to the left anterior descending coronary artery.  Free right  internal mammary to the diagonal coronary artery, sequential reverse  saphenous vein graft to the first and second obtuse marginal, right  artery bypass to the posterior descending coronary artery with endo-vein harvesting.   ENDARTERECTOMY FEMORAL Right 12/08/2022   Procedure: RIGHT COMMON FEMORAL ENDARTERECTOMY;  Surgeon: Magda Debby SAILOR, MD;  Location: [facility] OR;  Service: Vascular;  Laterality: Right;   INSERTION OF ILIAC STENT Bilateral 12/08/2022   Procedure: INSERTION OF BILATERAL ILIAC KISSING STENTS USING VIABAHN VBX STENTS;  Surgeon: Magda Debby SAILOR, MD;  Location: [facility] OR;  Service: Vascular;  Laterality: Bilateral;   LEFT HEART CATH AND CORS/GRAFTS ANGIOGRAPHY N/A 05/05/2021   Procedure: LEFT HEART CATH AND CORS/GRAFTS ANGIOGRAPHY;  Surgeon: Anner Alm ORN, MD;  [facility] INVASIVE CV LAB;  Service: Cardiovascular;  Laterality: N/A;   ULTRASOUND GUIDANCE FOR VASCULAR ACCESS Left 12/08/2022   Procedure: ULTRASOUND GUIDANCE FOR VASCULAR ACCESS OF LEFT FEMORAL ARTERY;  Surgeon: Magda Debby SAILOR, MD;  Location: [facility] OR;  Service: Vascular;  Laterality: Left;   UMBILICAL HERNIA REPAIR N/A 01/16/2021   Procedure: UMBILICAL  HERNIA REPAIR;  Surgeon: Dasie Leonor CROME, MD;  Location: [facility] OR;  Service: General;  Laterality: N/A;    MEDICATIONS: Current Outpatient Medications on File Prior to Visit  Medication Sig Dispense Refill   ACCU-CHEK GUIDE TEST test strip USE UP TO FOUR TIMES DAILY AS DIRECTED 400 strip 6   Accu-Chek Softclix Lancets lancets USE UP TO FOUR TIMES DAILY AS DIRECTED 100 each 12   amitriptyline  (ELAVIL ) 25 MG tablet Take 1 tablet (25 mg total) by mouth at bedtime. 30 tablet 1   aspirin  81 MG EC tablet Take 1 tablet (81 mg total) by mouth daily. Swallow whole. 30 tablet 12   atorvastatin  (LIPITOR ) 80 MG tablet TAKE 1 TABLET BY MOUTH EVERY DAY 90 tablet 3   BD PEN NEEDLE NANO 2ND GEN 32G X 4 MM MISC USE AS DIRECTED  300 each 1   blood glucose meter kit and supplies KIT Dispense based on patient and insurance preference. Use up to four times daily as directed. (FOR ICD-9 250.00, 250.01). 1 each 0   busPIRone  (BUSPAR ) 30 MG tablet Take 30 mg by mouth daily.     Cholecalciferol (VITAMIN D3) 75 MCG (3000 UT) TABS Take 1 tablet by mouth daily at 6 (six) AM. 30 tablet    cilostazol  (PLETAL ) 100 MG tablet TAKE 1 TABLET BY MOUTH TWICE A DAY BEFORE MEALS 180 tablet 3   clonazePAM  (KLONOPIN ) 1 MG tablet Take 1 tablet (1 mg total) by mouth 2 (two) times daily. 60 tablet 1   cloNIDine  HCl (KAPVAY ) 0.1 MG TB12 ER tablet TAKE 1 TABLET BY MOUTH 2 TIMES DAILY. 180 tablet 0   clopidogrel  (PLAVIX ) 75 MG tablet TAKE 1 TABLET BY MOUTH EVERY DAY WITH BREAKFAST 90 tablet 3   Continuous Glucose Receiver (DEXCOM G6 RECEIVER) DEVI Use as directed 1 each 0   Continuous Glucose Sensor (DEXCOM G6 SENSOR) MISC USE AS DIRECTED 3 each 1   Continuous Glucose Transmitter (DEXCOM G6 TRANSMITTER) MISC Check blood sugars as directed 1 each 1   dapagliflozin  propanediol (FARXIGA ) 10 MG TABS tablet Take 1 tablet (10 mg total) by mouth daily before breakfast. 90 tablet 1   Fezolinetant  (VEOZAH ) 45 MG TABS Take 1 tablet (  45 mg total) by mouth daily at 6 (six) AM.     insulin  degludec (TRESIBA  FLEXTOUCH) 100 UNIT/ML FlexTouch Pen INJECT 12 UNITS INTO THE SKIN DAILY. 3 mL 5   isosorbide  mononitrate (IMDUR ) 120 MG 24 hr tablet TAKE 1 TABLET BY MOUTH EVERY DAY 90 tablet 1   meclizine  (ANTIVERT ) 25 MG tablet Take 1 tablet (25 mg total) by mouth 3 (three) times daily as needed for dizziness. 30 tablet 0   metoprolol  succinate (TOPROL -XL) 25 MG 24 hr tablet TAKE 1 TABLET (25 MG TOTAL) BY MOUTH DAILY. 90 tablet 1   mupirocin  ointment (BACTROBAN ) 2 % Apply 1 application. topically 2 (two) times daily. 30 g 2   nitroGLYCERIN  (NITROSTAT ) 0.4 MG SL tablet PLACE 1 TABLET UNDER THE TONGUE EVERY 5 MINUTES AS NEEDED FOR CHEST PAIN. MAX 3. CALL 911 75 tablet 1    OLANZapine  (ZYPREXA ) 15 MG tablet TAKE 1 TABLET BY MOUTH EVERYDAY AT BEDTIME 60 tablet 0   oxyCODONE -acetaminophen  (PERCOCET/ROXICET) 5-325 MG tablet Take 1-2 tablets by mouth every 4 (four) hours as needed for moderate pain. 30 tablet 0   PARoxetine  (PAXIL ) 40 MG tablet Take 1 tablet (40 mg total) by mouth 2 (two) times daily. 180 tablet 0   ranolazine  (RANEXA ) 500 MG 12 hr tablet TAKE 1 TABLET BY MOUTH TWICE A DAY 180 tablet 1   Rimegepant Sulfate (NURTEC) 75 MG TBDP Take 1 tablet (75 mg total) by mouth every other day. 30 tablet 2   saxagliptin  HCl (ONGLYZA) 2.5 MG TABS tablet Take 1 tablet (2.5 mg total) by mouth daily. 90 tablet 0   XIIDRA  5 % SOLN Place 1 drop into both eyes in the morning and at bedtime.     No current facility-administered medications on file prior to visit.    ALLERGIES: Allergies  Allergen Reactions   Metformin  And Related Nausea And Vomiting    FAMILY HISTORY: Family History  Problem Relation Age of Onset   Lupus Mother    Heart attack Father    Diabetes Father    Hypertension Father    Diabetes Paternal Grandmother    Hypertension Paternal Grandmother    Stroke Neg Hx    Kidney disease Neg Hx    Hyperlipidemia Neg Hx    Sudden death Neg Hx     Objective:  *** General: No acute distress.  Patient appears well-groomed.   Head:  Normocephalic/atraumatic Eyes:  fundi examined but not visualized Neck: supple, no paraspinal tenderness, full range of motion Heart: regular rate and rhythm Neurological Exam: Mental status: alert and oriented to person, place, and time, speech fluent and not dysarthric, language intact. Cranial nerves: CN I: not tested CN II: pupils equal, round and reactive to light, visual fields intact CN III, IV, VI:  full range of motion, no nystagmus, no ptosis CN V: facial sensation intact. CN VII: upper and lower face symmetric CN VIII: hearing intact CN IX, X: gag intact, uvula midline CN XI: sternocleidomastoid and  trapezius muscles intact CN XII: tongue midline Bulk & Tone: normal, no fasciculations. Motor:  muscle strength 5/5 throughout Sensation:  Pinprick and vibratory sensation intact. Deep Tendon Reflexes:  2+ throughout,  toes downgoing.   Finger to nose testing:  Without dysmetria.   Gait:  Normal station and stride.  Romberg negative.    Thank you for allowing me to take part in the care of this patient.  Juliene Dunnings, DO  CC: ***

## 2024-02-02 ENCOUNTER — Ambulatory Visit: Admitting: Neurology

## 2024-02-03 ENCOUNTER — Other Ambulatory Visit: Payer: Self-pay | Admitting: Family

## 2024-02-03 NOTE — Telephone Encounter (Signed)
 Rx for lisinopril  2.5mg  denied. No longer on med list.

## 2024-02-09 ENCOUNTER — Other Ambulatory Visit: Payer: Self-pay | Admitting: Psychiatry

## 2024-02-09 DIAGNOSIS — F4001 Agoraphobia with panic disorder: Secondary | ICD-10-CM

## 2024-02-09 DIAGNOSIS — F3162 Bipolar disorder, current episode mixed, moderate: Secondary | ICD-10-CM

## 2024-02-09 DIAGNOSIS — F411 Generalized anxiety disorder: Secondary | ICD-10-CM

## 2024-02-09 DIAGNOSIS — F5105 Insomnia due to other mental disorder: Secondary | ICD-10-CM

## 2024-02-15 ENCOUNTER — Other Ambulatory Visit (HOSPITAL_BASED_OUTPATIENT_CLINIC_OR_DEPARTMENT_OTHER): Payer: Self-pay

## 2024-02-15 ENCOUNTER — Ambulatory Visit (INDEPENDENT_AMBULATORY_CARE_PROVIDER_SITE_OTHER): Admitting: Family

## 2024-02-15 VITALS — BP 83/57 | HR 90 | Temp 98.6°F | Resp 16 | Ht 64.0 in | Wt 125.0 lb

## 2024-02-15 DIAGNOSIS — I10 Essential (primary) hypertension: Secondary | ICD-10-CM

## 2024-02-15 DIAGNOSIS — E118 Type 2 diabetes mellitus with unspecified complications: Secondary | ICD-10-CM

## 2024-02-15 DIAGNOSIS — N1831 Chronic kidney disease, stage 3a: Secondary | ICD-10-CM

## 2024-02-15 DIAGNOSIS — I25119 Atherosclerotic heart disease of native coronary artery with unspecified angina pectoris: Secondary | ICD-10-CM | POA: Diagnosis not present

## 2024-02-15 DIAGNOSIS — E785 Hyperlipidemia, unspecified: Secondary | ICD-10-CM | POA: Diagnosis not present

## 2024-02-15 DIAGNOSIS — E010 Iodine-deficiency related diffuse (endemic) goiter: Secondary | ICD-10-CM

## 2024-02-15 DIAGNOSIS — Z794 Long term (current) use of insulin: Secondary | ICD-10-CM

## 2024-02-15 MED ORDER — DEXCOM G7 SENSOR MISC
5 refills | Status: AC
  Filled 2024-02-15: qty 3, 30d supply, fill #0
  Filled 2024-04-07: qty 3, 30d supply, fill #1
  Filled 2024-05-05: qty 3, 30d supply, fill #2
  Filled 2024-05-31: qty 3, 30d supply, fill #3
  Filled 2024-07-05: qty 3, 30d supply, fill #4

## 2024-02-15 MED ORDER — METOPROLOL SUCCINATE ER 25 MG PO TB24
12.5000 mg | ORAL_TABLET | Freq: Every day | ORAL | Status: DC
Start: 2024-02-15 — End: 2024-04-12

## 2024-02-15 NOTE — Assessment & Plan Note (Signed)
 On farxiga  update renal function. Will check that she was able to get established with

## 2024-02-15 NOTE — Assessment & Plan Note (Signed)
 Right sided thyroid  nodule- does not meet criteria for biopsy/dedicated follow up on recent US .

## 2024-02-15 NOTE — Assessment & Plan Note (Signed)
 Lab Results  Component Value Date   HGBA1C 5.9 12/05/2023   HGBA1C 6.8 (H) 08/15/2023   HGBA1C 6.7 (H) 05/03/2023   Lab Results  Component Value Date   MICROALBUR <0.7 08/15/2023   LDLCALC 70 10/05/2022   CREATININE 1.18 (H) 11/17/2023   Overtreated, decrease tresiba  from 12 units to 8 units.  She will watch closely and report back any further hypoglycemia.

## 2024-02-15 NOTE — Progress Notes (Unsigned)
 Subjective:     Patient ID: Bailey Greene, female    DOB: December 04, 1960, 63 y.o.   MRN: [id number]  Chief Complaint  Patient presents with   Hypertension    Here for follow up   Diabetes    Here for follow up   Migraine    Patient reports getting migraines almost daily    Hypertension  Diabetes  Migraine  Her past medical history is significant for hypertension.    Discussed the use of AI scribe software for clinical note transcription with the patient, who gave verbal consent to proceed.  History of Present Illness  Bailey Greene is a 63 year old female who presents for medication follow-up.  Headaches have decreased in frequency to one to two times per day. She is awaiting a neurology appointment for further evaluation. Current medication provides relief when taken as directed.  She experiences episodes of low blood pressure and palpitations, particularly after taking clonidine  for anxiety. She recently took clonidine  at 2:30 PM after lunch. She is also on metoprolol  25 mg.  She uses Tresiba  at 12 units and experiences low blood sugar episodes, especially around dinner and upon waking, with morning readings as low as 60 mg/dL. She uses a Dexcom G6 continuous glucose monitor but cannot sync her Dexcom G7 meter. Her A1c is 5.9, and she is also on Farxiga  for kidney health.  She takes Lipitor  80 mg for cholesterol management and is due for a cholesterol check. She has received her flu and COVID vaccines recently.  She works from home and is considering retirement next year to reduce stress, as she noticed a significant difference in stress levels when not working due to her headaches. She enjoys walking and acknowledges the need to take better care of herself.     Health Maintenance Due  Topic Date Due   OPHTHALMOLOGY EXAM  02/26/2023   Fecal DNA (Cologuard)  08/15/2023    Past Medical History:  Diagnosis Date   Anxiety    Bipolar affective disorder (HCC)     CAD (coronary artery disease) 2006   CABG w/ LIMA-LAD, RIMA-Diag, SVG-OM1-OM2, R radial-PDA   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   Qualifier: Diagnosis of  By: Raini Riggs     Diabetes mellitus type 2 in nonobese (HCC) 07/2016   Dyslipidemia    Headache(784.0)    History of pulmonary embolism 07/08/2020   HTN (hypertension)    Migraine    NSTEMI (non-ST elevated myocardial infarction) (HCC)    PAD (peripheral artery disease) (HCC)    Peripheral arterial disease (HCC)    Pulmonary embolus (HCC)    unprovoked    Past Surgical History:  Procedure Laterality Date   AORTOGRAM N/A 12/08/2022   Procedure: AORTOGRAM;  Surgeon: Magda Debby SAILOR, MD;  Location: [facility] OR;  Service: Vascular;  Laterality: N/A;   BUNIONECTOMY  08/2011   right foot   CARDIAC CATHETERIZATION  2007   severe native 3 v dz, all grafts patent (LIMA-LAD, RIMA-Diag, SVG-OM1-OM2, R radial-PDA)   CESAREAN SECTION  1984   CORONARY ARTERY BYPASS GRAFT  2006    Coronary artery bypass grafting x5 with a left  internal  mammary to the left anterior descending coronary artery.  Free right  internal mammary to the diagonal coronary artery, sequential reverse  saphenous vein graft to the first and second obtuse marginal, right  artery bypass to the posterior descending coronary artery with endo-vein harvesting.   ENDARTERECTOMY FEMORAL Right 12/08/2022   Procedure: RIGHT COMMON FEMORAL ENDARTERECTOMY;  Surgeon: Magda Debby SAILOR, MD;  Location: [facility] OR;  Service: Vascular;  Laterality: Right;   INSERTION OF ILIAC STENT Bilateral 12/08/2022   Procedure: INSERTION OF BILATERAL ILIAC KISSING STENTS USING VIABAHN VBX STENTS;  Surgeon: Magda Debby SAILOR, MD;  Location: [facility] OR;  Service: Vascular;  Laterality: Bilateral;   LEFT HEART CATH AND CORS/GRAFTS ANGIOGRAPHY N/A 05/05/2021   Procedure: LEFT HEART CATH AND CORS/GRAFTS ANGIOGRAPHY;  Surgeon: Anner Alm ORN, MD;  Location: [facility] INVASIVE CV LAB;  Service: Cardiovascular;  Laterality: N/A;    ULTRASOUND GUIDANCE FOR VASCULAR ACCESS Left 12/08/2022   Procedure: ULTRASOUND GUIDANCE FOR VASCULAR ACCESS OF LEFT FEMORAL ARTERY;  Surgeon: Magda Debby SAILOR, MD;  Location: [facility] OR;  Service: Vascular;  Laterality: Left;   UMBILICAL HERNIA REPAIR N/A 01/16/2021   Procedure: UMBILICAL  HERNIA REPAIR;  Surgeon: Dasie Leonor CROME, MD;  Location: [facility] OR;  Service: General;  Laterality: N/A;    Family History  Problem Relation Age of Onset   Lupus Mother    Heart attack Father    Diabetes Father    Hypertension Father    Diabetes Paternal Grandmother    Hypertension Paternal Grandmother    Stroke Neg Hx    Kidney disease Neg Hx    Hyperlipidemia Neg Hx    Sudden death Neg Hx     Social History   Socioeconomic History   Marital status: Legally Separated    Spouse name: Not on file   Number of children: Not on file   Years of education: Not on file   Highest education level: Some college, no degree  Occupational History   Occupation: Armed forces training and education officer  Tobacco Use   Smoking status: Former    Current packs/day: 0.00    Types: Cigarettes    Start date: 09/12/1996    Quit date: 09/12/2021    Years since quitting: 2.4   Smokeless tobacco: Never   Tobacco comments:    0.5 pack cigarettes  per month per patient   Vaping Use   Vaping status: Never Used  Substance and Sexual Activity   Alcohol use: No    Alcohol/week: 0.0 standard drinks of alcohol   Drug use: Never   Sexual activity: Yes    Partners: Male    Comment: married  Other Topics Concern   Not on file  Social History Narrative   Regular exercise:  3 days weeklyCaffeine    Use:  1 soda daily   One child biological daughter born in 58 and an adopted niece.[employer]- Engineer, technical sales   Married- may be divorcing.    Social Drivers of Health   Financial Resource Strain: Medium Risk (02/15/2024)   Overall Financial Resource Strain (CARDIA)    Difficulty of Paying Living Expenses: Somewhat hard  Food  Insecurity: Food Insecurity Present (02/15/2024)   Hunger Vital Sign    Worried About Running Out of Food in the Last Year: Sometimes true    Ran Out of Food in the Last Year: Sometimes true  Transportation Needs: No Transportation Needs (02/15/2024)   PRAPARE - Administrator, Civil Service (Medical): No    Lack of Transportation (Non-Medical): No  Physical Activity: Insufficiently Active (02/15/2024)   Exercise Vital Sign    Days of Exercise per Week: 4 days    Minutes of Exercise per Session: 20 min  Stress: Stress Concern Present (02/15/2024)   [employer] of Occupational Health - Occupational Stress Questionnaire    Feeling of Stress: Rather much  Social Connections: Socially Integrated (02/15/2024)   Social Connection and Isolation Panel    Frequency of Communication with Friends and Family: More than three times a week    Frequency of Social Gatherings with Friends and Family: Not on file    Attends Religious Services: More than 4 times per year    Active Member of [employer] or Organizations: Yes    Attends Engineer, structural: More than 4 times per year    Marital Status: Married  Catering manager Violence: Not on file    Outpatient Medications Prior to Visit  Medication Sig Dispense Refill   ACCU-CHEK GUIDE TEST test strip USE UP TO FOUR TIMES DAILY AS DIRECTED 400 strip 6   Accu-Chek Softclix Lancets lancets USE UP TO FOUR TIMES DAILY AS DIRECTED 100 each 12   amitriptyline  (ELAVIL ) 25 MG tablet Take 1 tablet (25 mg total) by mouth at bedtime. 30 tablet 1   aspirin  81 MG EC tablet Take 1 tablet (81 mg total) by mouth daily. Swallow whole. 30 tablet 12   atorvastatin  (LIPITOR ) 80 MG tablet TAKE 1 TABLET BY MOUTH EVERY DAY 90 tablet 3   BD PEN NEEDLE NANO 2ND GEN 32G X 4 MM MISC USE AS DIRECTED 300 each 1   blood glucose meter kit and supplies KIT Dispense based on patient and insurance preference. Use up to four times daily as directed. (FOR ICD-9 250.00,  250.01). 1 each 0   busPIRone  (BUSPAR ) 30 MG tablet Take 30 mg by mouth daily.     Cholecalciferol (VITAMIN D3) 75 MCG (3000 UT) TABS Take 1 tablet by mouth daily at 6 (six) AM. 30 tablet    cilostazol  (PLETAL ) 100 MG tablet TAKE 1 TABLET BY MOUTH TWICE A DAY BEFORE MEALS 180 tablet 3   clonazePAM  (KLONOPIN ) 1 MG tablet Take 1 tablet (1 mg total) by mouth 2 (two) times daily. 60 tablet 1   cloNIDine  HCl (KAPVAY ) 0.1 MG TB12 ER tablet TAKE 1 TABLET BY MOUTH 2 TIMES DAILY. 180 tablet 0   clopidogrel  (PLAVIX ) 75 MG tablet TAKE 1 TABLET BY MOUTH EVERY DAY WITH BREAKFAST 90 tablet 3   Continuous Glucose Receiver (DEXCOM G6 RECEIVER) DEVI Use as directed 1 each 0   Continuous Glucose Transmitter (DEXCOM G6 TRANSMITTER) MISC Check blood sugars as directed 1 each 1   dapagliflozin  propanediol (FARXIGA ) 10 MG TABS tablet Take 1 tablet (10 mg total) by mouth daily before breakfast. 90 tablet 1   Fezolinetant  (VEOZAH ) 45 MG TABS Take 1 tablet (45 mg total) by mouth daily at 6 (six) AM.     insulin  degludec (TRESIBA  FLEXTOUCH) 100 UNIT/ML FlexTouch Pen INJECT 12 UNITS INTO THE SKIN DAILY. 3 mL 5   isosorbide  mononitrate (IMDUR ) 120 MG 24 hr tablet TAKE 1 TABLET BY MOUTH EVERY DAY 90 tablet 1   meclizine  (ANTIVERT ) 25 MG tablet Take 1 tablet (25 mg total) by mouth 3 (three) times daily as needed for dizziness. 30 tablet 0   mupirocin  ointment (BACTROBAN ) 2 % Apply 1 application. topically 2 (two) times daily. 30 g 2   nitroGLYCERIN  (NITROSTAT ) 0.4 MG SL tablet PLACE 1 TABLET UNDER THE TONGUE EVERY 5 MINUTES AS NEEDED FOR CHEST PAIN. MAX 3. CALL 911 75 tablet 1   OLANZapine  (ZYPREXA ) 15 MG tablet TAKE 1 TABLET BY MOUTH EVERYDAY AT BEDTIME 60 tablet 0   PARoxetine  (PAXIL ) 40 MG tablet Take 1 tablet (40 mg total) by mouth 2 (two) times daily. 180 tablet 0  ranolazine  (RANEXA ) 500 MG 12 hr tablet TAKE 1 TABLET BY MOUTH TWICE A DAY 180 tablet 1   Rimegepant Sulfate (NURTEC) 75 MG TBDP Take 1 tablet (75 mg total)  by mouth every other day. 30 tablet 2   saxagliptin  HCl (ONGLYZA) 2.5 MG TABS tablet Take 1 tablet (2.5 mg total) by mouth daily. 90 tablet 0   XIIDRA  5 % SOLN Place 1 drop into both eyes in the morning and at bedtime.     Continuous Glucose Sensor (DEXCOM G6 SENSOR) MISC USE AS DIRECTED 3 each 1   metoprolol  succinate (TOPROL -XL) 25 MG 24 hr tablet TAKE 1 TABLET (25 MG TOTAL) BY MOUTH DAILY. 90 tablet 1   oxyCODONE -acetaminophen  (PERCOCET/ROXICET) 5-325 MG tablet Take 1-2 tablets by mouth every 4 (four) hours as needed for moderate pain. 30 tablet 0   No facility-administered medications prior to visit.    Allergies  Allergen Reactions   Metformin  And Related Nausea And Vomiting    ROS    See HPI Objective:    Physical Exam Constitutional:      General: She is not in acute distress.    Appearance: Normal appearance. She is well-developed.  HENT:     Head: Normocephalic and atraumatic.     Right Ear: External ear normal.     Left Ear: External ear normal.  Eyes:     General: No scleral icterus. Neck:     Thyroid : Thyromegaly (right) present.  Cardiovascular:     Rate and Rhythm: Normal rate and regular rhythm.     Heart sounds: Normal heart sounds. No murmur heard. Pulmonary:     Effort: Pulmonary effort is normal. No respiratory distress.     Breath sounds: Normal breath sounds. No wheezing.  Musculoskeletal:     Cervical back: Neck supple.  Skin:    General: Skin is warm and dry.  Neurological:     Mental Status: She is alert and oriented to person, place, and time.  Psychiatric:        Mood and Affect: Mood normal.        Behavior: Behavior normal.        Thought Content: Thought content normal.        Judgment: Judgment normal.      BP (!) 83/57 (BP Location: Right Arm, Patient Position: Sitting, Cuff Size: Small)   Pulse 90   Temp 98.6 F (37 C) (Oral)   Resp 16   Ht 5' 4 (1.626 m)   Wt 125 lb (56.7 kg)   LMP 11/28/2009   SpO2 99%   BMI 21.46 kg/m   Wt Readings from Last 3 Encounters:  02/15/24 125 lb (56.7 kg)  12/22/23 125 lb (56.7 kg)  12/20/23 125 lb (56.7 kg)       Assessment & Plan:   Problem List Items Addressed This Visit       Unprioritized   Hyperlipidemia with target LDL less than 70   Lab Results  Component Value Date   CHOL 130 10/05/2022   HDL 47.70 10/05/2022   LDLCALC 70 10/05/2022   LDLDIRECT 145.2 07/18/2008   TRIG 64.0 10/05/2022   CHOLHDL 3 10/05/2022   Maintained on lipitor  80 mg.  Update lipid panel.        Relevant Medications   metoprolol  succinate (TOPROL -XL) 25 MG 24 hr tablet   Other Relevant Orders   Lipid panel   HTN (hypertension) - Primary   BP Readings from Last 3 Encounters:  02/15/24 (!) 83/57  12/22/23 104/65  12/20/23 101/65         Relevant Medications   metoprolol  succinate (TOPROL -XL) 25 MG 24 hr tablet   Other Relevant Orders   Basic Metabolic Panel (BMET)   Coronary artery disease involving native coronary artery of native heart with angina pectoris (HCC)   Relevant Medications   metoprolol  succinate (TOPROL -XL) 25 MG 24 hr tablet   Controlled diabetes mellitus type 2 with complications (HCC)   Lab Results  Component Value Date   HGBA1C 5.9 12/05/2023   HGBA1C 6.8 (H) 08/15/2023   HGBA1C 6.7 (H) 05/03/2023   Lab Results  Component Value Date   MICROALBUR <0.7 08/15/2023   LDLCALC 70 10/05/2022   CREATININE 1.18 (H) 11/17/2023   Overtreated, decrease tresiba  from 12 units to 8 units.  She will watch closely and report back any further hypoglycemia.        Relevant Medications   Continuous Glucose Sensor (DEXCOM G7 SENSOR) MISC    I have discontinued Bailey Greene's Dexcom G6 Sensor and oxyCODONE -acetaminophen . I have also changed her metoprolol  succinate. Additionally, I am having her start on Dexcom G7 Sensor. Lastly, I am having her maintain her Xiidra , blood glucose meter kit and supplies, aspirin  EC, mupirocin  ointment, Accu-Chek Softclix  Lancets, Vitamin D3, Dexcom G6 Receiver, busPIRone , clopidogrel , atorvastatin , cilostazol , isosorbide  mononitrate, Tresiba  FlexTouch, Accu-Chek Guide Test, PARoxetine , meclizine , Veozah , saxagliptin  HCl, dapagliflozin  propanediol, Nurtec, amitriptyline , clonazePAM , ranolazine , Dexcom G6 Transmitter, BD Pen Needle Nano 2nd Gen, nitroGLYCERIN , OLANZapine , and cloNIDine  HCl.  Meds ordered this encounter  Medications   metoprolol  succinate (TOPROL -XL) 25 MG 24 hr tablet    Sig: Take 0.5 tablets (12.5 mg total) by mouth daily.    Supervising Provider:   DOMENICA BLACKBIRD A [[id number]]   Continuous Glucose Sensor (DEXCOM G7 SENSOR) MISC    Sig: Use one sensor every 2 weeks    Dispense:  6 each    Refill:  5    Supervising Provider:   DOMENICA BLACKBIRD A [[id number]]

## 2024-02-15 NOTE — Assessment & Plan Note (Signed)
 BP Readings from Last 3 Encounters:  02/15/24 (!) 83/57  12/22/23 104/65  12/20/23 101/65   She is hypotensive due to clonidine  which is being used off label for anxiety by psychiatry and they have been reluctant to decrease since she is responding well from an anxiety standpoint.  Will decrease toprol  xl from 25mg  to 12.5 mg.

## 2024-02-15 NOTE — Assessment & Plan Note (Signed)
 Lab Results  Component Value Date   CHOL 130 10/05/2022   HDL 47.70 10/05/2022   LDLCALC 70 10/05/2022   LDLDIRECT 145.2 07/18/2008   TRIG 64.0 10/05/2022   CHOLHDL 3 10/05/2022   Maintained on lipitor  80 mg.  Update lipid panel.

## 2024-02-16 ENCOUNTER — Other Ambulatory Visit: Payer: Self-pay | Admitting: Psychiatry

## 2024-02-16 ENCOUNTER — Ambulatory Visit: Payer: Self-pay | Admitting: Family

## 2024-02-16 ENCOUNTER — Encounter: Payer: Self-pay | Admitting: Family

## 2024-02-16 DIAGNOSIS — F5105 Insomnia due to other mental disorder: Secondary | ICD-10-CM

## 2024-02-16 DIAGNOSIS — G43011 Migraine without aura, intractable, with status migrainosus: Secondary | ICD-10-CM

## 2024-02-16 LAB — BASIC METABOLIC PANEL WITH GFR
BUN: 20 mg/dL (ref 6–23)
CO2: 28 meq/L (ref 19–32)
Calcium: 9.9 mg/dL (ref 8.4–10.5)
Chloride: 102 meq/L (ref 96–112)
Creatinine, Ser: 1.2 mg/dL (ref 0.40–1.20)
GFR: 48.96 mL/min — ABNORMAL LOW (ref >60.00)
Glucose, Bld: 90 mg/dL (ref 70–99)
Potassium: 3.8 meq/L (ref 3.5–5.1)
Sodium: 138 meq/L (ref 135–145)

## 2024-02-16 LAB — LIPID PANEL
Cholesterol: 144 mg/dL (ref 0–200)
HDL: 57.2 mg/dL (ref >39.00)
LDL Cholesterol: 68 mg/dL (ref 0–99)
NonHDL: 86.87
Total CHOL/HDL Ratio: 3
Triglycerides: 93 mg/dL (ref 0.0–149.0)
VLDL: 18.6 mg/dL (ref 0.0–40.0)

## 2024-02-16 NOTE — Patient Instructions (Signed)
 VISIT SUMMARY:  Today, we reviewed your medications and made some adjustments to help manage your diabetes, blood pressure, and overall health. We also discussed your upcoming neurology appointment and your mental health.  YOUR PLAN:  TYPE 2 DIABETES MELLITUS WITH HYPOGLYCEMIA: Your diabetes is well-controlled, but you are experiencing low blood sugar episodes. -Reduce Tresiba  to 8 units daily. -Monitor your blood glucose and keep it above 80 mg/dL. -We will send you a Dexcom G7 for continuous glucose monitoring. -Contact us  if your glucose remains below 80 mg/dL on 8 units.  HYPERTENSION: You are experiencing low blood pressure.  -Reduce your metoprolol  by halving the 25 mg tablet.  CHRONIC KIDNEY DISEASE: Your current medication, Farxiga , is beneficial for your kidney health. -Continue taking Farxiga . -We will order a kidney function test.  HYPERLIPIDEMIA: You are due for a cholesterol check. -We will order a cholesterol test.  HEADACHE: Your headaches have improved in frequency. -Attend your upcoming neurology appointment.  DEPRESSION: You have reported not feeling well mentally. -We will continue to monitor your mental health and discuss further steps as needed.

## 2024-02-16 NOTE — Telephone Encounter (Signed)
 Kidney function remains reduced but stable. I referred her to the [facility] back in March- did she see them?  If not, please place new referral to [facility] for Chronic Kidney disease.

## 2024-02-16 NOTE — Assessment & Plan Note (Signed)
Clinically stable, management per cardiology.  

## 2024-02-21 NOTE — Progress Notes (Unsigned)
 NEUROLOGY CONSULTATION NOTE  Bailey Greene MRN: [id number] DOB: 02/07/1961  Referring provider: Eleanor Ponto, NP Primary care provider: Eleanor Ponto, NP  Reason for consult:  migraines  Assessment/Plan:   Episodic cluster headache  Headache prevention:  Would like to start verapamil 80mg  three times daily.  EKG from 11/16/2023 revealed NSR with PR interval of 154 and QTc of 419.  I will reach out to patient's cardiologist to make sure he does not have an objection.  Otherwise, we can try Emgality 300mg .  However, she may be on the tail end of her cluster and hopefully will resolve soon anyway. Headache rescue:  Lidocaine  nasal spray.  Consider 100%O2. Follow up 3 months.   Subjective:  Bailey Greene is a 63 year old right-handed female with CAD s/p NSTEMI and CABG, peripheral arterial disease, DM 2, dyslipidemia, HTN, Bipolar affective disorder, anxiety and history of PE who presents for migraines.  History supplemented by referring provider's note.  CT and MRI head personally reviewed.  Onset:  in her 30s.  Occur intermittently every 3 to 5 years Location:  right sided of head/face/neck Quality:  throbbing Intensity:  10/10.  Aura:  absent Prodrome:  absent Postdrome:  absent Associated symptoms:  Nausea, photophobia, phonophobia, blurred vision in right eye, right ptosis, right eye lacrimation, right sided rhinorrhea, right eye conjunctival injection.  She denies associated vomiting, unilateral numbness or weakness. Duration:  2 hours; 1 hour with Nurtec   Frequency:  Occurs every 2 to 5 years for 3 months.  During that period, they occur 3 to 4 times a day, especially at night.  Current episodes started on 7/3.  Triggers/aggravating factors:  unknown Relieving factors:  nothing Activity:  usually has to be still and rest.  Cannot function  12/19/2023 CT HEAD WO:  1. No acute intracranial abnormality. 2. Moderately advanced periventricular and scattered  subcortical white matter hypoattenuation for age, similar to prior exam [10/17/2020]. 01/13/2006 MRI BRAIN W WO:  No acute intracranial abnormality.  No mass or hydrocephalus.  Multiple small white matter lesions involving the cerebral white matter and slight signal alteration of the pons as described above.   Past NSAIDS/analgesics:  tramadol , naproxen, meloxicam  Past abortive triptans:  sumatriptan  tab Past abortive ergotamine:  none Past muscle relaxants:  Flexeril  Past anti-emetic:  Zofran  Past antihypertensive medications:  verapamil, lisinopril  Past antidepressant medications:  sertraline , duloxetine , mirtazapine  Past anticonvulsant medications:  topiramate  (weight loss), divalproex  Past anti-CGRP:  none Other past therapies:  oxygen (years ago, doesn't remember if it helped)  Current NSAIDS/analgesics:  ASA 81mg  daily Current triptans:  none.  Contraindicated (coronary artery disease/history of NSTEMI) Current ergotamine:  none Current anti-emetic:  none Current muscle relaxants:  none Current Antihypertensive medications:  metoprolol  succinate XL 25mg  daily, clonidine , Imdur  Current Antidepressant medications:  amitriptyline  25mg  at bedtime, paroxetine  40mg  BID Current Anticonvulsant medications:  none Current anti-CGRP:  Nurtec 75mg  every other day Current Vitamins/Herbal/Supplements:  none Current Antihistamines/Decongestants:  meclizine  Other therapy:  none Other medications:  Zyprexa , busipirone, clonazepam  1mg  BID, Plavix , Pletal , NTG   Caffeine:  less than 1 cup of coffee daily.  No soda Alcohol:  no Smoker:  former Diet:  drinks five 16 oz bottles of water daily.  Salad, grilled/baked chicken, fruit, carrots, broccoli  Exercise:  walks Depression:  yes; Anxiety:  yes Sleep hygiene:  sleeps 4 to 5 hours a night on a good night (may need to get up and use the restroom twice)  History of TBI/concussion:  no Family history  of headache:  no Family history of cerebral  aneurysm:  no      PAST MEDICAL HISTORY: Past Medical History:  Diagnosis Date   Anxiety    Benign paroxysmal positional vertigo due to bilateral vestibular disorder 11/24/2023   Bipolar affective disorder (HCC)    CAD (coronary artery disease) 2006   CABG w/ LIMA-LAD, RIMA-Diag, SVG-OM1-OM2, R radial-PDA   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   Qualifier: Diagnosis of  By: Maleiyah Riggs     Diabetes mellitus type 2 in nonobese (HCC) 07/2016   Dyslipidemia    Headache(784.0)    History of pulmonary embolism 07/08/2020   HTN (hypertension)    Migraine    NSTEMI (non-ST elevated myocardial infarction) (HCC)    PAD (peripheral artery disease)    Peripheral arterial disease    Pulmonary embolus (HCC)    unprovoked    PAST SURGICAL HISTORY: Past Surgical History:  Procedure Laterality Date   AORTOGRAM N/A 12/08/2022   Procedure: AORTOGRAM;  Surgeon: Magda Debby SAILOR, MD;  Location: [facility] OR;  Service: Vascular;  Laterality: N/A;   BUNIONECTOMY  08/2011   right foot   CARDIAC CATHETERIZATION  2007   severe native 3 v dz, all grafts patent (LIMA-LAD, RIMA-Diag, SVG-OM1-OM2, R radial-PDA)   CESAREAN SECTION  1984   CORONARY ARTERY BYPASS GRAFT  2006    Coronary artery bypass grafting x5 with a left  internal  mammary to the left anterior descending coronary artery.  Free right  internal mammary to the diagonal coronary artery, sequential reverse  saphenous vein graft to the first and second obtuse marginal, right  artery bypass to the posterior descending coronary artery with endo-vein harvesting.   ENDARTERECTOMY FEMORAL Right 12/08/2022   Procedure: RIGHT COMMON FEMORAL ENDARTERECTOMY;  Surgeon: Magda Debby SAILOR, MD;  Location: [facility] OR;  Service: Vascular;  Laterality: Right;   INSERTION OF ILIAC STENT Bilateral 12/08/2022   Procedure: INSERTION OF BILATERAL ILIAC KISSING STENTS USING VIABAHN VBX STENTS;  Surgeon: Magda Debby SAILOR, MD;  Location: [facility] OR;  Service: Vascular;  Laterality:  Bilateral;   LEFT HEART CATH AND CORS/GRAFTS ANGIOGRAPHY N/A 05/05/2021   Procedure: LEFT HEART CATH AND CORS/GRAFTS ANGIOGRAPHY;  Surgeon: Anner Alm ORN, MD;  Location: [facility] INVASIVE CV LAB;  Service: Cardiovascular;  Laterality: N/A;   ULTRASOUND GUIDANCE FOR VASCULAR ACCESS Left 12/08/2022   Procedure: ULTRASOUND GUIDANCE FOR VASCULAR ACCESS OF LEFT FEMORAL ARTERY;  Surgeon: Magda Debby SAILOR, MD;  Location: [facility] OR;  Service: Vascular;  Laterality: Left;   UMBILICAL HERNIA REPAIR N/A 01/16/2021   Procedure: UMBILICAL  HERNIA REPAIR;  Surgeon: Dasie Leonor CROME, MD;  Location: [facility] OR;  Service: General;  Laterality: N/A;    MEDICATIONS: Current Outpatient Medications on File Prior to Visit  Medication Sig Dispense Refill   ACCU-CHEK GUIDE TEST test strip USE UP TO FOUR TIMES DAILY AS DIRECTED 400 strip 6   Accu-Chek Softclix Lancets lancets USE UP TO FOUR TIMES DAILY AS DIRECTED 100 each 12   amitriptyline  (ELAVIL ) 25 MG tablet TAKE 1 TABLET BY MOUTH EVERYDAY AT BEDTIME 30 tablet 1   aspirin  81 MG EC tablet Take 1 tablet (81 mg total) by mouth daily. Swallow whole. 30 tablet 12   atorvastatin  (LIPITOR ) 80 MG tablet TAKE 1 TABLET BY MOUTH EVERY DAY 90 tablet 3   BD PEN NEEDLE NANO 2ND GEN 32G X 4 MM MISC USE AS DIRECTED 300 each 1   blood glucose meter kit and supplies KIT Dispense based on patient and insurance  preference. Use up to four times daily as directed. (FOR ICD-9 250.00, 250.01). 1 each 0   busPIRone  (BUSPAR ) 30 MG tablet Take 30 mg by mouth daily.     Cholecalciferol (VITAMIN D3) 75 MCG (3000 UT) TABS Take 1 tablet by mouth daily at 6 (six) AM. 30 tablet    cilostazol  (PLETAL ) 100 MG tablet TAKE 1 TABLET BY MOUTH TWICE A DAY BEFORE MEALS 180 tablet 3   clonazePAM  (KLONOPIN ) 1 MG tablet Take 1 tablet (1 mg total) by mouth 2 (two) times daily. 60 tablet 1   cloNIDine  HCl (KAPVAY ) 0.1 MG TB12 ER tablet TAKE 1 TABLET BY MOUTH 2 TIMES DAILY. 180 tablet 0   clopidogrel  (PLAVIX ) 75 MG tablet  TAKE 1 TABLET BY MOUTH EVERY DAY WITH BREAKFAST 90 tablet 3   Continuous Glucose Receiver (DEXCOM G6 RECEIVER) DEVI Use as directed 1 each 0   Continuous Glucose Sensor (DEXCOM G7 SENSOR) MISC Use one sensor every 10 days 6 each 5   Continuous Glucose Transmitter (DEXCOM G6 TRANSMITTER) MISC Check blood sugars as directed 1 each 1   dapagliflozin  propanediol (FARXIGA ) 10 MG TABS tablet Take 1 tablet (10 mg total) by mouth daily before breakfast. 90 tablet 1   Fezolinetant  (VEOZAH ) 45 MG TABS Take 1 tablet (45 mg total) by mouth daily at 6 (six) AM.     insulin  degludec (TRESIBA  FLEXTOUCH) 100 UNIT/ML FlexTouch Pen INJECT 12 UNITS INTO THE SKIN DAILY. 3 mL 5   isosorbide  mononitrate (IMDUR ) 120 MG 24 hr tablet TAKE 1 TABLET BY MOUTH EVERY DAY 90 tablet 1   meclizine  (ANTIVERT ) 25 MG tablet Take 1 tablet (25 mg total) by mouth 3 (three) times daily as needed for dizziness. 30 tablet 0   metoprolol  succinate (TOPROL -XL) 25 MG 24 hr tablet Take 0.5 tablets (12.5 mg total) by mouth daily.     mupirocin  ointment (BACTROBAN ) 2 % Apply 1 application. topically 2 (two) times daily. 30 g 2   nitroGLYCERIN  (NITROSTAT ) 0.4 MG SL tablet PLACE 1 TABLET UNDER THE TONGUE EVERY 5 MINUTES AS NEEDED FOR CHEST PAIN. MAX 3. CALL 911 75 tablet 1   OLANZapine  (ZYPREXA ) 15 MG tablet TAKE 1 TABLET BY MOUTH EVERYDAY AT BEDTIME 60 tablet 0   PARoxetine  (PAXIL ) 40 MG tablet Take 1 tablet (40 mg total) by mouth 2 (two) times daily. 180 tablet 0   ranolazine  (RANEXA ) 500 MG 12 hr tablet TAKE 1 TABLET BY MOUTH TWICE A DAY 180 tablet 1   Rimegepant Sulfate (NURTEC) 75 MG TBDP Take 1 tablet (75 mg total) by mouth every other day. 30 tablet 2   saxagliptin  HCl (ONGLYZA) 2.5 MG TABS tablet Take 1 tablet (2.5 mg total) by mouth daily. 90 tablet 0   XIIDRA  5 % SOLN Place 1 drop into both eyes in the morning and at bedtime.     No current facility-administered medications on file prior to visit.    ALLERGIES: Allergies  Allergen  Reactions   Metformin  And Related Nausea And Vomiting    FAMILY HISTORY: Family History  Problem Relation Age of Onset   Lupus Mother    Heart attack Father    Diabetes Father    Hypertension Father    Diabetes Paternal Grandmother    Hypertension Paternal Grandmother    Stroke Neg Hx    Kidney disease Neg Hx    Hyperlipidemia Neg Hx    Sudden death Neg Hx     Objective:  Blood pressure 107/68, pulse 82, height  5' 4 (1.626 m), weight 126 lb 8 oz (57.4 kg), last menstrual period 11/28/2009, SpO2 97%. General: No acute distress.  Patient appears well-groomed.   Head:  Normocephalic/atraumatic Eyes:  fundi examined but not visualized Neck: supple, no paraspinal tenderness, full range of motion Heart: regular rate and rhythm Neurological Exam: Mental status: alert and oriented to person, place, and time, speech fluent and not dysarthric, language intact. Cranial nerves: CN I: not tested CN II: pupils equal, round and reactive to light, visual fields intact CN III, IV, VI:  full range of motion, no nystagmus, no ptosis CN V: facial sensation intact. CN VII: upper and lower face symmetric CN VIII: hearing intact CN IX, X: gag intact, uvula midline CN XI: sternocleidomastoid and trapezius muscles intact CN XII: tongue midline Bulk & Tone: normal, no fasciculations. Motor:  muscle strength 5/5 throughout Sensation:  Pinprick and vibratory sensation intact. Deep Tendon Reflexes:  2+ throughout,  toes downgoing.   Finger to nose testing:  Without dysmetria.   Gait:  Normal station and stride.  Romberg negative.    Thank you for allowing me to take part in the care of this patient.  Juliene Dunnings, DO  CC: Bailey Ponto, NP

## 2024-02-22 ENCOUNTER — Encounter: Payer: Self-pay | Admitting: Neurology

## 2024-02-22 ENCOUNTER — Ambulatory Visit: Admitting: Neurology

## 2024-02-22 VITALS — BP 107/68 | HR 82 | Ht 64.0 in | Wt 126.5 lb

## 2024-02-22 DIAGNOSIS — G44019 Episodic cluster headache, not intractable: Secondary | ICD-10-CM | POA: Diagnosis not present

## 2024-02-22 NOTE — Patient Instructions (Signed)
 Will find out if we can start verapamil to help prevent the headaches.  Will let you know At earliest onset of headache, I want you to take a lidocaine  nasal spray.  Will contact you when ordered and where to pick it up.   Follow up 3 months.

## 2024-02-23 ENCOUNTER — Ambulatory Visit: Admitting: Obstetrics and Gynecology

## 2024-02-24 ENCOUNTER — Other Ambulatory Visit: Payer: Self-pay | Admitting: Cardiology

## 2024-02-24 MED ORDER — ATORVASTATIN CALCIUM 80 MG PO TABS: 80.0000 mg | ORAL_TABLET | Freq: Every day | ORAL | 0 refills | Status: DC

## 2024-02-29 ENCOUNTER — Other Ambulatory Visit (HOSPITAL_BASED_OUTPATIENT_CLINIC_OR_DEPARTMENT_OTHER): Payer: Self-pay

## 2024-03-02 ENCOUNTER — Telehealth: Payer: Self-pay | Admitting: Cardiology

## 2024-03-02 MED ORDER — ATORVASTATIN CALCIUM 80 MG PO TABS: 80.0000 mg | ORAL_TABLET | Freq: Every day | ORAL | 0 refills | Status: DC

## 2024-03-02 NOTE — Telephone Encounter (Signed)
*  STAT* If patient is at the pharmacy, call can be transferred to refill team.   1. Which medications need to be refilled? (please list name of each medication and dose if known)   atorvastatin  (LIPITOR ) 80 MG tablet    2. Which pharmacy/location (including street and city if local pharmacy) is medication to be sent to?  [id number] - [place], [place] - [address]      3. Do they need a 30 day or 90 day supply? 90 day    Pt is out of medication and a 30 day supply was sent in when it was supposed to be 90 DAY.

## 2024-03-05 ENCOUNTER — Other Ambulatory Visit (HOSPITAL_BASED_OUTPATIENT_CLINIC_OR_DEPARTMENT_OTHER): Payer: Self-pay

## 2024-03-08 ENCOUNTER — Ambulatory Visit: Admitting: Psychiatry

## 2024-03-08 ENCOUNTER — Other Ambulatory Visit: Payer: Self-pay | Admitting: Cardiology

## 2024-03-08 ENCOUNTER — Encounter: Payer: Self-pay | Admitting: Psychiatry

## 2024-03-08 VITALS — BP 98/65 | HR 76

## 2024-03-08 DIAGNOSIS — F411 Generalized anxiety disorder: Secondary | ICD-10-CM

## 2024-03-08 DIAGNOSIS — F5105 Insomnia due to other mental disorder: Secondary | ICD-10-CM

## 2024-03-08 DIAGNOSIS — F3162 Bipolar disorder, current episode mixed, moderate: Secondary | ICD-10-CM

## 2024-03-08 DIAGNOSIS — F4001 Agoraphobia with panic disorder: Secondary | ICD-10-CM

## 2024-03-08 DIAGNOSIS — G43011 Migraine without aura, intractable, with status migrainosus: Secondary | ICD-10-CM

## 2024-03-08 NOTE — Progress Notes (Signed)
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 the Last Year: Sometimes true  Transportation Needs: No Transportation Needs (02/15/2024)   PRAPARE - Administrator, Civil Service (Medical): No    Lack of Transportation (Non-Medical): No  Physical Activity: Insufficiently Active (02/15/2024)   Exercise Vital Sign    Days of Exercise per Week: 4 days    Minutes of Exercise per Session: 20 min  Stress: Stress Concern Present (02/15/2024)   [employer] of Occupational Health - Occupational Stress Questionnaire    Feeling of Stress: Rather much  Social Connections: Socially Integrated (02/15/2024)   Social Connection and Isolation Panel    Frequency of Communication with Friends and Family: More than three times a week    Frequency of Social Gatherings with Friends and Family: Not on file    Attends Religious Services: More than 4 times per year    Active Member of [employer] or Organizations: Yes    Attends Engineer, structural: More than 4 times per year    Marital Status: Married  Catering manager Violence: Not on file    Past Medical History, Surgical history, Social history, and Family history were reviewed and updated as appropriate.   Please see review of systems for further  details on the patient's review from today.   Objective:   Physical Exam:  BP 98/65   Pulse 76   LMP 11/28/2009   Physical Exam Constitutional:  General: She is not in acute distress.    Appearance: She is well-developed.  Musculoskeletal:        General: No deformity.  Neurological:     Mental Status: She is alert and oriented to person, place, and time.     Coordination: Coordination normal.  Psychiatric:        Attention and Perception: She is attentive. She does not perceive auditory hallucinations.        Mood and Affect: Mood is anxious and depressed. Affect is blunt. Affect is not labile, angry or tearful.        Speech: Speech normal. Speech is not rapid and pressured or slurred.        Behavior: Behavior normal. Behavior is not agitated or slowed.        Thought Content: Thought content normal. Thought content is not paranoid or delusional. Thought content does not include homicidal or suicidal ideation.        Cognition and Memory: Cognition normal.        Judgment: Judgment normal.     Comments: Insight intact. No auditory or visual hallucinations. No delusions.  Improved anxiety , panic, depression. Not resolved.        Lab Review:     Component Value Date/Time   NA 138 02/15/2024 1617   NA 141 07/23/2020 1300   K 3.8 02/15/2024 1617   CL 102 02/15/2024 1617   CO2 28 02/15/2024 1617   GLUCOSE 90 02/15/2024 1617   BUN 20 02/15/2024 1617   BUN 7 07/23/2020 1300   CREATININE 1.20 02/15/2024 1617   CREATININE 0.72 06/22/2012 0840   CALCIUM  9.9 02/15/2024 1617   PROT 6.9 11/30/2022 1351   PROT 6.6 03/29/2022 1504   ALBUMIN 3.9 11/30/2022 1351   ALBUMIN 4.6 03/29/2022 1504   AST 17 11/30/2022 1351   ALT 18 11/30/2022 1351   ALKPHOS 95 11/30/2022 1351   BILITOT 0.7 11/30/2022 1351   BILITOT 0.4 03/29/2022 1504   GFRNONAA 53 (L) 11/17/2023 0000   GFRNONAA >89 06/22/2012 0840   GFRAA 102 07/23/2020 1300   GFRAA >89 06/22/2012 0840       Component  Value Date/Time   WBC 7.2 11/17/2023 0000   RBC 4.13 11/17/2023 0000   HGB 12.6 11/17/2023 0000   HCT 38.1 11/17/2023 0000   PLT 172 11/17/2023 0000   MCV 92.3 11/17/2023 0000   MCH 30.5 11/17/2023 0000   MCHC 33.1 11/17/2023 0000   RDW 14.2 11/17/2023 0000   LYMPHSABS 2.2 02/25/2022 0950   MONOABS 0.4 02/25/2022 0950   EOSABS 0.3 02/25/2022 0950   BASOSABS 0.0 02/25/2022 0950    No results found for: POCLITH, LITHIUM   No results found for: PHENYTOIN, PHENOBARB, VALPROATE, CBMZ   .res Assessment: Plan:    Bailey Greene was seen today for follow-up, anxiety, depression and headache.  Diagnoses and all orders for this visit:  Panic disorder with agoraphobia  Moderate mixed bipolar I disorder (HCC)  Generalized anxiety disorder  Insomnia due to mental condition  Intractable migraine without aura and with status migrainosus      Chronic work stress  30 min VIDEO face to face time with patient was spent on counseling and coordination of care. We discussed the following:  Repeated bouts of STD DT anxiety and depression. Usually associated lately with increased work volume demands .  she is more anxious and depressed with panic lately affecting work.    DT hangover reduced olanzapine   15 mg 3 hours  before HS.  Good benefit clonidine  for anxiety and irritability off label.  but Duration too short with IR clonidine .   Switched to clonidine  ER 0.1 mg AM and PM..  Which is better.  More calm with H .  Dont' get as irritated.  But would like higher but can't today DT BP 98/65  Push fluids. She monitors BP  Continue high dose paroxetine  off label DT TRD and TR anxiety to 80 mg daily since early June 2024  Disc risk high dose.   Disc SE in detail and SSRI withdrawal sx.  clonazepam   prn 1 mg BID.  Consider switch to sertraline .  Discussed potential metabolic side effects associated with atypical antipsychotics, as well as potential risk for movement side  effects. Advised pt to contact office if movement side effects occur.  DM managed  We discussed the short-term risks associated with benzodiazepines including sedation and increased fall risk among others.  Discussed long-term side effect risk including dependence, potential withdrawal symptoms, and the potential eventual dose-related risk of dementia.  But recent studies from 2020 dispute this association between benzodiazepines and dementia risk. Newer studies in 2020 do not support an association with dementia. May have to pursue a change in BZ if clonidine  doesn't help.  Amitriptyline  25 helped sleep and appetite Option increase it for panic and anxiety.  She doesn't want to increase it.  Follow-up 2 mos  Lorene Macintosh MD, DFAPA  Please see After Visit Summary for patient specific instructions.  Lorene Macintosh, MD, DFAPA    Future Appointments  Date Time Provider Department Center  05/10/2024 11:30 AM Skeet Juliene SAUNDERS, DO [facility] None  06/05/2024 10:00 AM Zuleta, Kaitlin G, NP [facility]  06/26/2024  9:00 AM [facility]  06/26/2024 10:00 AM [facility]  06/26/2024 10:30 AM [facility]    No orders of the defined types were placed in this encounter.      -------------------------------

## 2024-03-11 ENCOUNTER — Other Ambulatory Visit: Payer: Self-pay | Admitting: Psychiatry

## 2024-03-11 DIAGNOSIS — G43011 Migraine without aura, intractable, with status migrainosus: Secondary | ICD-10-CM

## 2024-03-11 DIAGNOSIS — F5105 Insomnia due to other mental disorder: Secondary | ICD-10-CM

## 2024-03-12 ENCOUNTER — Telehealth: Payer: Self-pay | Admitting: Psychiatry

## 2024-03-12 DIAGNOSIS — F4001 Agoraphobia with panic disorder: Secondary | ICD-10-CM

## 2024-03-12 DIAGNOSIS — F411 Generalized anxiety disorder: Secondary | ICD-10-CM

## 2024-03-12 DIAGNOSIS — F3162 Bipolar disorder, current episode mixed, moderate: Secondary | ICD-10-CM

## 2024-03-12 DIAGNOSIS — F5105 Insomnia due to other mental disorder: Secondary | ICD-10-CM

## 2024-03-12 NOTE — Telephone Encounter (Signed)
 Patient needs refill on Olanzapine  15mg  Clonazepam  1mg  and Clonidine  0.1mg . Ph: [phone] Appt 12/16 [facility]acy [facility] [address] [place]

## 2024-03-13 ENCOUNTER — Other Ambulatory Visit: Payer: Self-pay

## 2024-03-13 DIAGNOSIS — F411 Generalized anxiety disorder: Secondary | ICD-10-CM

## 2024-03-13 DIAGNOSIS — F4001 Agoraphobia with panic disorder: Secondary | ICD-10-CM

## 2024-03-13 NOTE — Telephone Encounter (Addendum)
 Pt has Rx for clonidine . Sent in RF for olanzapine . Clonazepam  due 10/17.

## 2024-03-15 ENCOUNTER — Telehealth: Payer: Self-pay | Admitting: Family

## 2024-03-15 NOTE — Telephone Encounter (Signed)
 Copied from CRM [id number]. Topic: General - Other >> Mar 15, 2024  5:03 PM Alfonso HERO wrote: Reason for CRM: Call came in from Dr. Jama from [employer]. He is calling for Peer to Peer for patients Disability review. The case closes 03/20/24 @ 5pm. He needs a call back at his direct line of [phone].

## 2024-03-18 ENCOUNTER — Other Ambulatory Visit: Payer: Self-pay | Admitting: Cardiology

## 2024-03-18 ENCOUNTER — Other Ambulatory Visit: Payer: Self-pay | Admitting: Vascular Surgery

## 2024-03-19 ENCOUNTER — Telehealth: Payer: Self-pay

## 2024-03-19 NOTE — Telephone Encounter (Signed)
 Copied from CRM #[id number]. Topic: General - Other >> Mar 19, 2024  9:54 AM Aleatha BROCKS wrote: Reason for CRM:  Call came in from Dr. Jama from [employer]. He is calling for Peer to Peer for patients Disability review. The case closes 03/20/24 @ 5pm. He needs a call back at his direct line of [phone].

## 2024-03-19 NOTE — Telephone Encounter (Signed)
 Spoke with Dr. Jama, he stated that he needed to know if pt has any functional impairments from her renal disease and I told him that she does not.

## 2024-03-25 LAB — COLOGUARD: COLOGUARD: NEGATIVE

## 2024-03-27 NOTE — Progress Notes (Deleted)
  Cardiology Office Note:   Date:  03/27/2024  ID:  Bailey Greene, DOB 27-Oct-1960, MRN [id number] PCP: Daryl Setter, NP  [facility] Providers Cardiologist:  Lynwood Schilling, MD Cardiology APP:  Cindie Delon POUR, PA-C (Inactive) {  History of Present Illness:   Bailey Greene is a 63 y.o. female who presents for followup of a history of coronary artery disease status post CABG x5 in 2006 followup cath in 2007 showed patent grafts and improved LV function EF 65%. She had a normal stress test in May 2012 and again in July 2016 and again in March of 2018.   She had a low risk perfusion study in Feb 2019.   She was in the hospital  05/05/2021 with NSTEMI with troponin peaking at 3359.  Cardiac catheterization showed severe multivessel native CAD, 3 out of 5 grafts patent (LIMA-LAD, free RIMA-diagonal, left radial-RPDA) culprit lesion felt to be 100% occluded SVG-OM-OM2 with patent OM1-OM2 limb.  She was recommended for medical therapy.     Since I last saw her she is doing better on 120 mg of Imdur  which I increased last time.  She has continued to get some chest discomfort with emotional stress at work or if she walks 1/2-hour.  She is not having any resting complaints.  She thinks she has had to take about 2 nitroglycerin  since I last saw her.  She has coronary disease as below and we are managing this medically for small vessel and branch vessel disease.  ROS: ***  Studies Reviewed:    EKG:       ***  Risk Assessment/Calculations:   {Does this patient have ATRIAL FIBRILLATION?:[id number]} No BP recorded.  {Refresh Note OR Click here to enter BP  :1}***        Physical Exam:   VS:  LMP 11/28/2009    Wt Readings from Last 3 Encounters:  02/22/24 126 lb 8 oz (57.4 kg)  02/15/24 125 lb (56.7 kg)  12/22/23 125 lb (56.7 kg)     GEN: Well nourished, well developed in no acute distress NECK: No JVD; No carotid bruits CARDIAC: ***RR, *** murmurs, rubs,  gallops RESPIRATORY:  Clear to auscultation without rales, wheezing or rhonchi  ABDOMEN: Soft, non-tender, non-distended EXTREMITIES:  No edema; No deformity   ASSESSMENT AND PLAN:   CAD:     ***    Today I am going to add Ranexa  500 mg twice daily to the regimen.  I will have her come back in a couple of months.  She will continue the other meds as listed.  She will let me know if she has accelerated pain but again she has diffuse branch vessel abnormal chronically occluded vessels originate equally.  S   DYSLIPIDEMIA:   ***   She is tolerating high-dose statin and her LDL was 65.   HTN:     The BP was ***  Blood pressure was at target.   DM:   A1C was ***  7.0 which is down from a peak of 13.8.  This is being managed by her primary provider. \   TOBACCO:   ***   I have encouraged complete abstinence.  She is smoking 2 cigarettes a day or so.   PVD:   She has PVD ***   and is seen by [facility] Vonzell at [facility]     Follow up ***  Signed, Lynwood Schilling, MD

## 2024-03-28 ENCOUNTER — Ambulatory Visit: Admitting: Cardiology

## 2024-03-28 DIAGNOSIS — E785 Hyperlipidemia, unspecified: Secondary | ICD-10-CM

## 2024-03-28 DIAGNOSIS — Z720 Tobacco use: Secondary | ICD-10-CM

## 2024-03-28 DIAGNOSIS — I2510 Atherosclerotic heart disease of native coronary artery without angina pectoris: Secondary | ICD-10-CM

## 2024-03-28 DIAGNOSIS — I10 Essential (primary) hypertension: Secondary | ICD-10-CM

## 2024-03-28 DIAGNOSIS — I739 Peripheral vascular disease, unspecified: Secondary | ICD-10-CM

## 2024-03-28 DIAGNOSIS — E118 Type 2 diabetes mellitus with unspecified complications: Secondary | ICD-10-CM

## 2024-03-30 ENCOUNTER — Other Ambulatory Visit: Payer: Self-pay | Admitting: Psychiatry

## 2024-03-30 DIAGNOSIS — F411 Generalized anxiety disorder: Secondary | ICD-10-CM

## 2024-03-30 DIAGNOSIS — F4001 Agoraphobia with panic disorder: Secondary | ICD-10-CM

## 2024-04-05 ENCOUNTER — Ambulatory Visit: Admitting: Cardiology

## 2024-04-09 ENCOUNTER — Other Ambulatory Visit: Payer: Self-pay | Admitting: Psychiatry

## 2024-04-09 DIAGNOSIS — F4001 Agoraphobia with panic disorder: Secondary | ICD-10-CM

## 2024-04-09 DIAGNOSIS — F5105 Insomnia due to other mental disorder: Secondary | ICD-10-CM

## 2024-04-09 DIAGNOSIS — F411 Generalized anxiety disorder: Secondary | ICD-10-CM

## 2024-04-09 DIAGNOSIS — F3162 Bipolar disorder, current episode mixed, moderate: Secondary | ICD-10-CM

## 2024-04-11 NOTE — Progress Notes (Signed)
  Cardiology Office Note:   Date:  04/12/2024  ID:  Bailey Greene, DOB [birth year], MRN [id number] PCP: Daryl Setter, NP  [facility] Providers Cardiologist:  Lynwood Schilling, MD Cardiology APP:  Cindie Delon POUR, PA-C (Inactive) {  History of Present Illness:   Bailey Greene is a 63 y.o. female who presents for followup of a history of coronary artery disease status post CABG x5 in 2006 followup cath in 2007 showed patent grafts and improved LV function EF 65%. She had a normal stress test in May 2012 and again in July 2016 and again in March of 2018.   She had a low risk perfusion study in Feb 2019.   She was in the hospital  05/05/2021 with NSTEMI with troponin peaking at 3359.  Cardiac catheterization showed severe multivessel native CAD, 3 out of 5 grafts patent (LIMA-LAD, free RIMA-diagonal, left radial-RPDA) culprit lesion felt to be 100% occluded SVG-OM-OM2 with patent OM1-OM2 limb.  She was recommended for medical therapy.     Since I last saw her she has had no new cardiovascular complaints.  She walks 3-4 times per week and she denies any chest discomfort, neck or arm discomfort.  She does not have to take any sublingual nitroglycerin .  She might have to have an abdominal hernia repair but she is having some soreness with that.    ROS: As stated in the HPI and negative for all other systems.  Studies Reviewed:    EKG:     Normal sinus rhythm, rate 80, axis within normal limits, intervals within normal limits, no acute ST-T wave changes, 11/16/2023   Risk Assessment/Calculations:              Physical Exam:   VS:  BP 110/68   Pulse 90   Ht 5' 4 (1.626 m)   Wt 126 lb (57.2 kg)   LMP 11/28/2009   SpO2 99%   BMI 21.63 kg/m    Wt Readings from Last 3 Encounters:  04/12/24 126 lb (57.2 kg)  02/22/24 126 lb 8 oz (57.4 kg)  02/15/24 125 lb (56.7 kg)     GEN: Well nourished, well developed in no acute distress NECK: No JVD; No carotid bruits CARDIAC:  RRR, no murmurs, rubs, gallops RESPIRATORY:  Clear to auscultation without rales, wheezing or rhonchi  ABDOMEN: Soft, non-tender, non-distended EXTREMITIES:  No edema; No deformity   ASSESSMENT AND PLAN:   CAD:     The patient has no new sypmtoms.  No further cardiovascular testing is indicated.  We will continue with aggressive risk reduction.  I will stop her aspirin  and use Plavix  alone.  DYSLIPIDEMIA:   LDL 68 with an HDL of 57.  No change in therapy.  CKD: She does have some mild renal insufficiency and she was very worried about this but actually her creatinine was 1.2 which is slightly better than previous.   HTN:     The BP was at target.  No change in therapy.  DM:   A1C was down to 5.9 from 7.0.  This is being managed by her primary provider. She says she is only smoking 1 to 2 cigarettes/week and I have  TOBACCO:   Encourage complete abstinence.   PVD:   She has PVD and is followed by Dr. Girard.  Follow up with me in 1 year or sooner if needed.  Signed, Lynwood Schilling, MD

## 2024-04-12 ENCOUNTER — Ambulatory Visit: Attending: Cardiology | Admitting: Cardiology

## 2024-04-12 ENCOUNTER — Encounter: Payer: Self-pay | Admitting: Cardiology

## 2024-04-12 VITALS — BP 110/68 | HR 90 | Ht 64.0 in | Wt 126.0 lb

## 2024-04-12 DIAGNOSIS — I2510 Atherosclerotic heart disease of native coronary artery without angina pectoris: Secondary | ICD-10-CM

## 2024-04-12 DIAGNOSIS — I10 Essential (primary) hypertension: Secondary | ICD-10-CM

## 2024-04-12 DIAGNOSIS — I739 Peripheral vascular disease, unspecified: Secondary | ICD-10-CM

## 2024-04-12 DIAGNOSIS — E118 Type 2 diabetes mellitus with unspecified complications: Secondary | ICD-10-CM | POA: Diagnosis not present

## 2024-04-12 DIAGNOSIS — Z720 Tobacco use: Secondary | ICD-10-CM

## 2024-04-12 DIAGNOSIS — E785 Hyperlipidemia, unspecified: Secondary | ICD-10-CM | POA: Diagnosis not present

## 2024-04-12 DIAGNOSIS — I25119 Atherosclerotic heart disease of native coronary artery with unspecified angina pectoris: Secondary | ICD-10-CM

## 2024-04-12 MED ORDER — METOPROLOL SUCCINATE ER 25 MG PO TB24: 12.5000 mg | ORAL_TABLET | Freq: Every day | ORAL | 3 refills | Status: DC

## 2024-04-12 NOTE — Patient Instructions (Signed)
 Medication Instructions:  Stop Aspirin  *If you need a refill on your cardiac medications before your next appointment, please call your pharmacy*  Lab Work: NONE If you have labs (blood work) drawn today and your tests are completely normal, you will receive your results only by: MyChart Message (if you have MyChart) OR A paper copy in the mail If you have any lab test that is abnormal or we need to change your treatment, we will call you to review the results.  Testing/Procedures: NONE  Follow-Up: At [facility], you and your health needs are our priority.  As part of our continuing mission to provide you with exceptional heart care, our providers are all part of one team.  This team includes your primary Cardiologist (physician) and Advanced Practice Providers or APPs (Physician Assistants and Nurse Practitioners) who all work together to provide you with the care you need, when you need it.  Your next appointment:   1 year(s)  Provider:   Lynwood Schilling, MD    We recommend signing up for the patient portal called MyChart.  Sign up information is provided on this After Visit Summary.  MyChart is used to connect with patients for Virtual Visits (Telemedicine).  Patients are able to view lab/test results, encounter notes, upcoming appointments, etc.  Non-urgent messages can be sent to your provider as well.   To learn more about what you can do with MyChart, go to [email or web address].

## 2024-04-14 ENCOUNTER — Other Ambulatory Visit: Payer: Self-pay | Admitting: Cardiology

## 2024-04-14 ENCOUNTER — Other Ambulatory Visit: Payer: Self-pay | Admitting: Family

## 2024-04-16 ENCOUNTER — Other Ambulatory Visit: Payer: Self-pay | Admitting: Cardiology

## 2024-04-20 ENCOUNTER — Other Ambulatory Visit: Payer: Self-pay | Admitting: Surgery

## 2024-04-20 DIAGNOSIS — R1033 Periumbilical pain: Secondary | ICD-10-CM

## 2024-04-24 ENCOUNTER — Telehealth: Payer: Self-pay | Admitting: Psychiatry

## 2024-04-24 NOTE — Telephone Encounter (Signed)
 Pt said you were aware she took clonidine  TID. She wants a new script to reflect this dosing.  Switched to clonidine  ER 0.1 mg AM and PM..  Which is better.  More calm with H .  Dont' get as irritated.  But would like higher but can't today DT BP 98/65  Push fluids.  I tried calling her to get some readings but was not able to reach her. I reviewed notes from other providers she has seen the past week and show readings of 105/69, 86; 110/68, 90.

## 2024-04-24 NOTE — Telephone Encounter (Signed)
 Pt called at 10:13a.  She said she Dr Geoffry knows she's been taking 3 Clonidine  a day.  She would like the last script re-written to reflect that so she doesn't run out early.   [id number] [address], [place] - [address], [place] [address] Phone: [phone]  Fax: [phone]   Next appt 12/16

## 2024-04-24 NOTE — Telephone Encounter (Signed)
 Tried calling, phone kept ringing, no way to leave a message.

## 2024-04-25 ENCOUNTER — Other Ambulatory Visit: Payer: Self-pay | Admitting: Psychiatry

## 2024-04-25 DIAGNOSIS — F4001 Agoraphobia with panic disorder: Secondary | ICD-10-CM

## 2024-04-25 DIAGNOSIS — F411 Generalized anxiety disorder: Secondary | ICD-10-CM

## 2024-04-25 MED ORDER — CLONIDINE HCL ER 0.1 MG PO TB12: 0.1000 mg | ORAL_TABLET | Freq: Three times a day (TID) | ORAL | 0 refills | Status: DC

## 2024-04-25 NOTE — Telephone Encounter (Signed)
 Noted. Can try to submit a PA for tid but not sure the response back

## 2024-04-25 NOTE — Telephone Encounter (Signed)
 Contacted pharmacy and no PA needed they have already filled it.

## 2024-04-25 NOTE — Telephone Encounter (Signed)
 Yes she is correct taking clonidine  TID.  Will send RX

## 2024-04-25 NOTE — Telephone Encounter (Signed)
 She did say she wanted to increase clonidine  er (Kapvay ) to TID.  She previously took IR clonidine  as TID.  But at last visit she was taking ER at BID.  I can send in RX for TID but insurance may not cover the ER at TID.  I'll try.  We tried the patch but I think it was too expensive.

## 2024-05-07 ENCOUNTER — Encounter: Payer: Self-pay | Admitting: Family

## 2024-05-07 DIAGNOSIS — E118 Type 2 diabetes mellitus with unspecified complications: Secondary | ICD-10-CM

## 2024-05-08 MED ORDER — TRESIBA FLEXTOUCH 100 UNIT/ML ~~LOC~~ SOPN: 8.0000 [IU] | PEN_INJECTOR | Freq: Every day | SUBCUTANEOUS | Status: AC

## 2024-05-09 NOTE — Progress Notes (Deleted)
 NEUROLOGY FOLLOW UP OFFICE NOTE  Bailey Greene [id number]  Assessment/Plan:   Episodic cluster headache  Headache prevention:  Would like to start verapamil 80mg  three times daily.  EKG from 11/16/2023 revealed NSR with PR interval of 154 and QTc of 419.  I will reach out to patient's cardiologist to make sure he does not have an objection.  Otherwise, we can try Emgality 300mg .  However, she may be on the tail end of her cluster and hopefully will resolve soon anyway. Headache rescue:  Lidocaine  nasal spray.  Consider 100%O2. Follow up 3 months.   Subjective:  Bailey Greene is a 63 year old right-handed female with CAD s/p NSTEMI and CABG, peripheral arterial disease, DM 2, dyslipidemia, HTN, Bipolar affective disorder, anxiety and history of PE who follows up for cluster headaches.  UPDATE: Intensity:  *** Duration:  *** Frequency:  *** Frequency of abortive medication: *** Current NSAIDS/analgesics:  ASA 81mg  daily Current triptans:  none.  Contraindicated (coronary artery disease/history of NSTEMI) Current ergotamine:  none Current anti-emetic:  none Current muscle relaxants:  none Current Antihypertensive medications:  metoprolol  succinate XL 25mg  daily, clonidine , Imdur  Current Antidepressant medications:  amitriptyline  25mg  at bedtime, paroxetine  40mg  BID Current Anticonvulsant medications:  none Current anti-CGRP:  Nurtec 75mg  every other day Current Vitamins/Herbal/Supplements:  none Current Antihistamines/Decongestants:  meclizine  Other therapy:  none Other medications:  Zyprexa , busipirone, clonazepam  1mg  BID, Plavix , Pletal , NTG   Caffeine:  less than 1 cup of coffee daily.  No soda Alcohol:  no Smoker:  former Diet:  drinks five 16 oz bottles of water daily.  Salad, grilled/baked chicken, fruit, carrots, broccoli  Exercise:  walks Depression:  yes; Anxiety:  yes Sleep hygiene:  sleeps 4 to 5 hours a night on a good night (may need to get up and use  the restroom twice)  HISTORY: Onset:  in her 30s.  Occur intermittently every 3 to 5 years Location:  right sided of head/face/neck Quality:  throbbing Intensity:  10/10.  Aura:  absent Prodrome:  absent Postdrome:  absent Associated symptoms:  Nausea, photophobia, phonophobia, blurred vision in right eye, right ptosis, right eye lacrimation, right sided rhinorrhea, right eye conjunctival injection.  She denies associated vomiting, unilateral numbness or weakness. Duration:  2 hours; 1 hour with Nurtec   Frequency:  Occurs every 2 to 5 years for 3 months.  During that period, they occur 3 to 4 times a day, especially at night.  Current episodes started on 7/3.  Triggers/aggravating factors:  unknown Relieving factors:  nothing Activity:  usually has to be still and rest.  Cannot function  12/19/2023 CT HEAD WO:  1. No acute intracranial abnormality. 2. Moderately advanced periventricular and scattered subcortical white matter hypoattenuation for age, similar to prior exam [10/17/2020]. 01/13/2006 MRI BRAIN W WO:  No acute intracranial abnormality.  No mass or hydrocephalus.  Multiple small white matter lesions involving the cerebral white matter and slight signal alteration of the pons as described above.   Past NSAIDS/analgesics:  tramadol , naproxen, meloxicam  Past abortive triptans:  sumatriptan  tab Past abortive ergotamine:  none Past muscle relaxants:  Flexeril  Past anti-emetic:  Zofran  Past antihypertensive medications:  verapamil, lisinopril  Past antidepressant medications:  sertraline , duloxetine , mirtazapine  Past anticonvulsant medications:  topiramate  (weight loss), divalproex  Past anti-CGRP:  none Other past therapies:  oxygen (years ago, doesn't remember if it helped)   History of TBI/concussion:  no Family history of headache:  no Family history of cerebral aneurysm:  no  PAST MEDICAL  HISTORY: Past Medical History:  Diagnosis Date   Anxiety    Benign paroxysmal  positional vertigo due to bilateral vestibular disorder 11/24/2023   Bipolar affective disorder (HCC)    CAD (coronary artery disease) 2006   CABG w/ LIMA-LAD, RIMA-Diag, SVG-OM1-OM2, R radial-PDA   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   Qualifier: Diagnosis of  By: Hadeel Riggs     Diabetes mellitus type 2 in nonobese (HCC) 07/2016   Dyslipidemia    Headache(784.0)    History of pulmonary embolism 07/08/2020   HTN (hypertension)    Migraine    NSTEMI (non-ST elevated myocardial infarction) (HCC)    PAD (peripheral artery disease)    Peripheral arterial disease    Pulmonary embolus (HCC)    unprovoked    MEDICATIONS: Current Outpatient Medications on File Prior to Visit  Medication Sig Dispense Refill   ACCU-CHEK GUIDE TEST test strip USE UP TO FOUR TIMES DAILY AS DIRECTED 400 strip 6   Accu-Chek Softclix Lancets lancets USE UP TO FOUR TIMES DAILY AS DIRECTED 100 each 12   amitriptyline  (ELAVIL ) 25 MG tablet TAKE 1 TABLET BY MOUTH EVERYDAY AT BEDTIME 90 tablet 0   atorvastatin  (LIPITOR ) 80 MG tablet Take 1 tablet (80 mg total) by mouth daily. 90 tablet 3   BD PEN NEEDLE NANO 2ND GEN 32G X 4 MM MISC USE AS DIRECTED 300 each 1   blood glucose meter kit and supplies KIT Dispense based on patient and insurance preference. Use up to four times daily as directed. (FOR ICD-9 250.00, 250.01). 1 each 0   busPIRone  (BUSPAR ) 30 MG tablet Take 30 mg by mouth daily.     Cholecalciferol (VITAMIN D3) 75 MCG (3000 UT) TABS Take 1 tablet by mouth daily at 6 (six) AM. 30 tablet    cilostazol  (PLETAL ) 100 MG tablet TAKE 1 TABLET BY MOUTH TWICE A DAY BEFORE MEALS 180 tablet 3   clonazePAM  (KLONOPIN ) 1 MG tablet TAKE 1 TABLET BY MOUTH TWICE A DAY 60 tablet 1   cloNIDine  HCl (KAPVAY ) 0.1 MG TB12 ER tablet Take 1 tablet (0.1 mg total) by mouth 3 (three) times daily. 270 tablet 0   clopidogrel  (PLAVIX ) 75 MG tablet TAKE 1 TABLET BY MOUTH EVERY DAY WITH BREAKFAST 90 tablet 3   Continuous Glucose Receiver (DEXCOM  G6 RECEIVER) DEVI Use as directed 1 each 0   Continuous Glucose Sensor (DEXCOM G7 SENSOR) MISC Use one sensor every 10 days 6 each 5   Continuous Glucose Transmitter (DEXCOM G6 TRANSMITTER) MISC Check blood sugars as directed 1 each 1   dapagliflozin  propanediol (FARXIGA ) 10 MG TABS tablet Take 1 tablet (10 mg total) by mouth daily before breakfast. 90 tablet 1   Fezolinetant  (VEOZAH ) 45 MG TABS Take 1 tablet (45 mg total) by mouth daily at 6 (six) AM. (Patient not taking: Reported on 04/12/2024)     insulin  degludec (TRESIBA  FLEXTOUCH) 100 UNIT/ML FlexTouch Pen Inject 8 Units into the skin daily.     isosorbide  mononitrate (IMDUR ) 120 MG 24 hr tablet TAKE 1 TABLET BY MOUTH EVERY DAY 90 tablet 3   meclizine  (ANTIVERT ) 25 MG tablet Take 1 tablet (25 mg total) by mouth 3 (three) times daily as needed for dizziness. 30 tablet 0   metoprolol  succinate (TOPROL -XL) 25 MG 24 hr tablet Take 0.5 tablets (12.5 mg total) by mouth daily. 45 tablet 3   mupirocin  ointment (BACTROBAN ) 2 % Apply 1 application. topically 2 (two) times daily. 30 g 2   nitroGLYCERIN  (NITROSTAT ) 0.4 MG  SL tablet PLACE 1 TABLET UNDER THE TONGUE EVERY 5 MINUTES AS NEEDED FOR CHEST PAIN. MAX 3. CALL 911 25 tablet 0   OLANZapine  (ZYPREXA ) 15 MG tablet TAKE 1 TABLET BY MOUTH EVERYDAY AT BEDTIME 90 tablet 0   PARoxetine  (PAXIL ) 40 MG tablet Take 1 tablet (40 mg total) by mouth 2 (two) times daily. 180 tablet 0   ranolazine  (RANEXA ) 500 MG 12 hr tablet TAKE 1 TABLET BY MOUTH TWICE A DAY 180 tablet 1   Rimegepant Sulfate (NURTEC) 75 MG TBDP Take 1 tablet (75 mg total) by mouth every other day. 30 tablet 2   saxagliptin  HCl (ONGLYZA) 2.5 MG TABS tablet TAKE 1 TABLET BY MOUTH EVERY DAY 90 tablet 0   No current facility-administered medications on file prior to visit.    ALLERGIES: Allergies  Allergen Reactions   Metformin  And Related Nausea And Vomiting    FAMILY HISTORY: Family History  Problem Relation Age of Onset   Lupus Mother     Heart attack Father    Diabetes Father    Hypertension Father    Diabetes Paternal Grandmother    Hypertension Paternal Grandmother    Stroke Neg Hx    Kidney disease Neg Hx    Hyperlipidemia Neg Hx    Sudden death Neg Hx       Objective:  *** General: No acute distress.  Patient appears ***-groomed.   Head:  Normocephalic/atraumatic Eyes:  Fundi examined but not visualized Neck: supple, no paraspinal tenderness, full range of motion Heart:  Regular rate and rhythm Neurological Exam: alert and oriented.  Speech fluent and not dysarthric, language intact.  CN II-XII intact. Bulk and tone normal, muscle strength 5/5 throughout.  Sensation to light touch intact.  Deep tendon reflexes 2+ throughout, toes downgoing.  Finger to nose testing intact.  Gait normal, Romberg negative.   Juliene Dunnings, DO  CC: ***

## 2024-05-10 ENCOUNTER — Ambulatory Visit: Admitting: Neurology

## 2024-05-10 NOTE — Telephone Encounter (Signed)
 Form printed out and patient will come 05/11/24 am to sign

## 2024-05-11 NOTE — Telephone Encounter (Signed)
 Pt came in and signed papers. Left papers in pcp box

## 2024-05-11 NOTE — Telephone Encounter (Signed)
 Forms taken to the back for pcp to complete her portion

## 2024-05-15 ENCOUNTER — Encounter: Payer: Self-pay | Admitting: Psychiatry

## 2024-05-15 ENCOUNTER — Ambulatory Visit: Admitting: Psychiatry

## 2024-05-15 VITALS — BP 126/77 | HR 81

## 2024-05-15 DIAGNOSIS — F5105 Insomnia due to other mental disorder: Secondary | ICD-10-CM | POA: Diagnosis not present

## 2024-05-15 DIAGNOSIS — G43011 Migraine without aura, intractable, with status migrainosus: Secondary | ICD-10-CM | POA: Diagnosis not present

## 2024-05-15 DIAGNOSIS — F3162 Bipolar disorder, current episode mixed, moderate: Secondary | ICD-10-CM

## 2024-05-15 DIAGNOSIS — F4001 Agoraphobia with panic disorder: Secondary | ICD-10-CM

## 2024-05-15 DIAGNOSIS — F411 Generalized anxiety disorder: Secondary | ICD-10-CM | POA: Diagnosis not present

## 2024-05-15 MED ORDER — PAROXETINE HCL 40 MG PO TABS: 40.0000 mg | ORAL_TABLET | Freq: Two times a day (BID) | ORAL | 0 refills | Status: AC

## 2024-05-15 MED ORDER — CLONIDINE HCL ER 0.1 MG PO TB12: 0.1000 mg | ORAL_TABLET | Freq: Three times a day (TID) | ORAL | 0 refills | Status: AC

## 2024-05-15 MED ORDER — AMITRIPTYLINE HCL 25 MG PO TABS: 25.0000 mg | ORAL_TABLET | Freq: Every day | ORAL | 1 refills | Status: AC

## 2024-05-15 MED ORDER — OLANZAPINE 15 MG PO TABS: 15.0000 mg | ORAL_TABLET | Freq: Every day | ORAL | 0 refills | Status: AC

## 2024-05-15 MED ORDER — CLONAZEPAM 1 MG PO TABS: 1.0000 mg | ORAL_TABLET | Freq: Two times a day (BID) | ORAL | 3 refills | Status: AC

## 2024-05-15 NOTE — Progress Notes (Signed)
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 Engineer, technical sales   Married- may be divorcing.    Left handed   Social Drivers of Health   Tobacco Use: Medium Risk (05/15/2024)   Patient History    Smoking Tobacco Use: Former    Smokeless Tobacco Use: Never    Passive Exposure: Not on file  Financial Resource Strain: Medium Risk (02/15/2024)   Overall Financial Resource Strain (CARDIA)    Difficulty of Paying Living Expenses: Somewhat hard  Food Insecurity: Food Insecurity Present (02/15/2024)   Epic    Worried About Programme Researcher, Broadcasting/film/video in the Last Year: Sometimes true    Ran Out of Food in the Last Year: Sometimes true  Transportation Needs: No Transportation Needs (02/15/2024)   Epic    Lack of Transportation (Medical): No    Lack of Transportation (Non-Medical): No  Physical Activity: Insufficiently Active (02/15/2024)   Exercise Vital Sign    Days of Exercise per Week: 4 days    Minutes of Exercise per Session: 20 min  Stress: Stress Concern Present (02/15/2024)   [employer] of Occupational Health - Occupational Stress Questionnaire    Feeling of Stress: Rather much  Social Connections: Socially Integrated (02/15/2024)   Social Connection and Isolation Panel    Frequency of  Communication with Friends and Family: More than three times a week    Frequency of Social Gatherings with Friends and Family: Not on file  Attends Religious Services: More than 4 times per year    Active Member of Clubs or Organizations: Yes    Attends Banker Meetings: More than 4 times per year    Marital Status: Married  Catering Manager Violence: Not on file  Depression (PHQ2-9): Medium Risk (02/01/2023)   Depression (PHQ2-9)    PHQ-2 Score: 9  Alcohol Screen: Not on file  Housing: Unknown (04/19/2024)   Received from [facility]   Epic    Unable to Pay for Housing in the Last Year: Not on file    Number of Times Moved in the Last Year: Not on file    At any time in the past 12 months, were you homeless or living in a shelter (including now)?: No  Recent Concern: Housing - High Risk (02/15/2024)   Epic    Unable to Pay for Housing in the Last Year: Yes    Number of Times Moved in the Last Year: 1    Homeless in the Last Year: No  Utilities: Not on file  Health Literacy: Not on file    Past Medical History, Surgical history, Social history, and Family history were reviewed and updated as appropriate.   Please see review of systems for further details on the patient's review from today.   Objective:   Physical Exam:  BP 126/77   Pulse 81   LMP 11/28/2009   Physical Exam Constitutional:      General: She is not in acute distress.    Appearance: She is well-developed.  Musculoskeletal:        General: No deformity.  Neurological:     Mental Status: She is alert and oriented to person, place, and time.     Coordination: Coordination normal.  Psychiatric:        Attention and Perception: She is attentive. She does not perceive auditory hallucinations.        Mood and Affect: Mood is anxious. Mood is not depressed. Affect is blunt. Affect is not labile, angry or tearful.        Speech: Speech normal. Speech is not rapid and pressured or  slurred.        Behavior: Behavior normal. Behavior is not agitated or slowed.        Thought Content: Thought content normal. Thought content is not paranoid or delusional. Thought content does not include homicidal or suicidal ideation.        Cognition and Memory: Cognition normal.        Judgment: Judgment normal.     Comments: Insight intact. No auditory or visual hallucinations. No delusions.  Improved anxiety , panic, depression. Not resolved. Less dep and less anxious, but not gone.        Lab Review:     Component Value Date/Time   NA 138 02/15/2024 1617   NA 141 07/23/2020 1300   K 3.8 02/15/2024 1617   CL 102 02/15/2024 1617   CO2 28 02/15/2024 1617   GLUCOSE 90 02/15/2024 1617   BUN 20 02/15/2024 1617   BUN 7 07/23/2020 1300   CREATININE 1.20 02/15/2024 1617   CREATININE 0.72 06/22/2012 0840   CALCIUM  9.9 02/15/2024 1617   PROT 6.9 11/30/2022 1351   PROT 6.6 03/29/2022 1504   ALBUMIN 3.9 11/30/2022 1351   ALBUMIN 4.6 03/29/2022 1504   AST 17 11/30/2022 1351   ALT 18 11/30/2022 1351   ALKPHOS 95 11/30/2022 1351   BILITOT 0.7 11/30/2022 1351   BILITOT  0.4 03/29/2022 1504   GFRNONAA 53 (L) 11/17/2023 0000   GFRNONAA >89 06/22/2012 0840   GFRAA 102 07/23/2020 1300   GFRAA >89 06/22/2012 0840       Component Value Date/Time   WBC 7.2 11/17/2023 0000   RBC 4.13 11/17/2023 0000   HGB 12.6 11/17/2023 0000   HCT 38.1 11/17/2023 0000   PLT 172 11/17/2023 0000   MCV 92.3 11/17/2023 0000   MCH 30.5 11/17/2023 0000   MCHC 33.1 11/17/2023 0000   RDW 14.2 11/17/2023 0000   LYMPHSABS 2.2 02/25/2022 0950   MONOABS 0.4 02/25/2022 0950   EOSABS 0.3 02/25/2022 0950   BASOSABS 0.0 02/25/2022 0950    No results found for: POCLITH, LITHIUM   No results found for: PHENYTOIN, PHENOBARB, VALPROATE, CBMZ   .res Assessment: Plan:    Bailey Greene was seen today for follow-up, anxiety and medication reaction.  Diagnoses and all orders for this  visit:  Panic disorder with agoraphobia -     PARoxetine  (PAXIL ) 40 MG tablet; Take 1 tablet (40 mg total) by mouth 2 (two) times daily. -     OLANZapine  (ZYPREXA ) 15 MG tablet; Take 1 tablet (15 mg total) by mouth at bedtime. -     cloNIDine  HCl (KAPVAY ) 0.1 MG TB12 ER tablet; Take 1 tablet (0.1 mg total) by mouth 3 (three) times daily. -     clonazePAM  (KLONOPIN ) 1 MG tablet; Take 1 tablet (1 mg total) by mouth 2 (two) times daily.  Generalized anxiety disorder -     PARoxetine  (PAXIL ) 40 MG tablet; Take 1 tablet (40 mg total) by mouth 2 (two) times daily. -     OLANZapine  (ZYPREXA ) 15 MG tablet; Take 1 tablet (15 mg total) by mouth at bedtime. -     cloNIDine  HCl (KAPVAY ) 0.1 MG TB12 ER tablet; Take 1 tablet (0.1 mg total) by mouth 3 (three) times daily. -     clonazePAM  (KLONOPIN ) 1 MG tablet; Take 1 tablet (1 mg total) by mouth 2 (two) times daily.  Moderate mixed bipolar I disorder (HCC) -     OLANZapine  (ZYPREXA ) 15 MG tablet; Take 1 tablet (15 mg total) by mouth at bedtime.  Insomnia due to mental condition -     OLANZapine  (ZYPREXA ) 15 MG tablet; Take 1 tablet (15 mg total) by mouth at bedtime. -     amitriptyline  (ELAVIL ) 25 MG tablet; Take 1 tablet (25 mg total) by mouth at bedtime.  Intractable migraine without aura and with status migrainosus -     amitriptyline  (ELAVIL ) 25 MG tablet; Take 1 tablet (25 mg total) by mouth at bedtime.       Chronic work stress  30 min VIDEO face to face time with patient was spent on counseling and coordination of care. We discussed the following:  Repeated bouts of STD DT anxiety and depression. Usually associated lately with increased work volume demands .  she is more anxious and depressed with panic lately affecting work.    DT hangover reduced olanzapine   15 mg 3 hours before HS.  Good benefit clonidine  for anxiety and irritability off label.  but Duration too short with IR clonidine .   Switched to clonidine  ER 0.1 mg AM and PM..   Which is better.  More calm with H .  Dont' get as irritated.  But would like higher but can't today DT BP 98/65  Push fluids. She monitors BP  Continue high dose paroxetine  off label DT TRD and TR anxiety to  80 mg daily since early June 2024  Disc risk high dose.   Disc SE in detail and SSRI withdrawal sx.  clonazepam   prn 1 mg BID.  Consider switch to sertraline .  Discussed potential metabolic side effects associated with atypical antipsychotics, as well as potential risk for movement side effects. Advised pt to contact office if movement side effects occur.  DM managed  We discussed the short-term risks associated with benzodiazepines including sedation and increased fall risk among others.  Discussed long-term side effect risk including dependence, potential withdrawal symptoms, and the potential eventual dose-related risk of dementia.  But recent studies from 2020 dispute this association between benzodiazepines and dementia risk. Newer studies in 2020 do not support an association with dementia. May have to pursue a change in BZ if clonidine  doesn't help.  Amitriptyline  25 helped sleep and appetite Option increase it for panic and anxiety.  She doesn't want to increase it.  Follow-up 3 mos  Lorene Macintosh MD, DFAPA  Please see After Visit Summary for patient specific instructions.  Lorene Macintosh, MD, DFAPA    Future Appointments  Date Time Provider Department Center  06/05/2024 10:00 AM Zuleta, Kaitlin G, NP [facility]  06/06/2024  2:30 PM Skeet Juliene SAUNDERS, DO [facility] None  06/26/2024  9:00 AM [facility]  06/26/2024 10:00 AM [facility]  06/26/2024 10:30 AM [facility]    No orders of the defined types were placed in this encounter.      -------------------------------

## 2024-05-27 ENCOUNTER — Other Ambulatory Visit: Payer: Self-pay | Admitting: Cardiology

## 2024-06-05 ENCOUNTER — Ambulatory Visit: Admitting: Obstetrics and Gynecology

## 2024-06-05 ENCOUNTER — Encounter: Payer: Self-pay | Admitting: Obstetrics and Gynecology

## 2024-06-05 VITALS — BP 121/77 | HR 72

## 2024-06-05 DIAGNOSIS — N813 Complete uterovaginal prolapse: Secondary | ICD-10-CM | POA: Diagnosis not present

## 2024-06-05 DIAGNOSIS — M6289 Other specified disorders of muscle: Secondary | ICD-10-CM

## 2024-06-05 DIAGNOSIS — R350 Frequency of micturition: Secondary | ICD-10-CM | POA: Diagnosis not present

## 2024-06-05 LAB — POCT URINE DIPSTICK
Bilirubin, UA: NEGATIVE
Blood, UA: NEGATIVE
Glucose, UA: 500 mg/dL — AB
Ketones, POC UA: NEGATIVE mg/dL
Leukocytes, UA: NEGATIVE
Nitrite, UA: NEGATIVE
POC PROTEIN,UA: NEGATIVE
Spec Grav, UA: 1.015
Urobilinogen, UA: 0.2 U/dL
pH, UA: 6

## 2024-06-05 MED ORDER — ESTRADIOL 0.01 % VA CREA: 0.5000 g | TOPICAL_CREAM | VAGINAL | 11 refills | Status: AC

## 2024-06-05 MED ORDER — DIAZEPAM 5 MG PO TABS: ORAL_TABLET | ORAL | 0 refills | Status: AC

## 2024-06-05 NOTE — Patient Instructions (Addendum)
 Start estrogen cream nightly for 2 weeks. Use your finger not the applicator. Use a blueberry sized amount into the vagina nightly for 2 weeks and then after two weeks use it twice a week after.   You have stage 4 out of 4 uterine prolapse. We discussed some surgical options today.   Please use the vaginal valium  nightly for the next two weeks.

## 2024-06-05 NOTE — Progress Notes (Signed)
 "  NEUROLOGY FOLLOW UP OFFICE NOTE  Brailey Buescher [id number]  Assessment/Plan:   Episodic cluster headache  Headache prevention:  Will defer for now.  If headaches begin increasing in frequency, will start verapamil 80mg  three times daily.  Headache rescue:  Lidocaine  nasal spray.  Instructed her to go to the compound pharmacy.  Consider 100%O2. Follow up 3 months.   Subjective:  Collins Dimaria is a 64 year old right-handed female with CAD s/p NSTEMI and CABG, peripheral arterial disease, DM 2, dyslipidemia, HTN, Bipolar affective disorder, anxiety and history of PE who follows up for cluster headaches.  UPDATE: She thinks overall it is improving but she has had some exacerbations. In September:  about 2, lasting 1 to 2 days In November:  none In December:  4 days first week, 3-4 days second week.   Lasts several hours with Nurtec but less severe.  Did not pick up the lidocaine  spray.   Current NSAIDS/analgesics:  ASA 81mg  daily Current triptans:  none.  Contraindicated (coronary artery disease/history of NSTEMI) Current ergotamine:  none Current anti-emetic:  none Current muscle relaxants:  none Current Antihypertensive medications:  metoprolol  succinate XL 25mg  daily, clonidine , Imdur  Current Antidepressant medications:  amitriptyline  25mg  at bedtime, paroxetine  40mg  BID Current Anticonvulsant medications:  none Current anti-CGRP:  Nurtec 75mg  every other day Current Vitamins/Herbal/Supplements:  none Current Antihistamines/Decongestants:  meclizine  Other therapy:  none Other medications:  Zyprexa , busipirone, clonazepam  1mg  BID, Plavix , Pletal , NTG   Caffeine:  less than 1 cup of coffee daily.  No soda Alcohol:  no Smoker:  former Diet:  drinks five 16 oz bottles of water daily.  Salad, grilled/baked chicken, fruit, carrots, broccoli  Exercise:  walks Depression:  yes; Anxiety:  yes Sleep hygiene:  sleeps 4 to 5 hours a night on a good night (may need to get up  and use the restroom twice)  HISTORY: Onset:  in her 30s.  Occur intermittently every 3 to 5 years Location:  right sided of head/face/neck Quality:  throbbing Intensity:  10/10.  Aura:  absent Prodrome:  absent Postdrome:  absent Associated symptoms:  Nausea, photophobia, phonophobia, blurred vision in right eye, right ptosis, right eye lacrimation, right sided rhinorrhea, right eye conjunctival injection.  She denies associated vomiting, unilateral numbness or weakness. Duration:  2 hours; 1 hour with Nurtec   Frequency:  Occurs every 2 to 5 years for 3 months.  During that period, they occur 3 to 4 times a day, especially at night.  Current episodes started on 7/3.  Triggers/aggravating factors:  unknown Relieving factors:  nothing Activity:  usually has to be still and rest.  Cannot function  12/19/2023 CT HEAD WO:  1. No acute intracranial abnormality. 2. Moderately advanced periventricular and scattered subcortical white matter hypoattenuation for age, similar to prior exam [10/17/2020]. 01/13/2006 MRI BRAIN W WO:  No acute intracranial abnormality.  No mass or hydrocephalus.  Multiple small white matter lesions involving the cerebral white matter and slight signal alteration of the pons as described above.   Past NSAIDS/analgesics:  tramadol , naproxen, meloxicam  Past abortive triptans:  sumatriptan  tab Past abortive ergotamine:  none Past muscle relaxants:  Flexeril  Past anti-emetic:  Zofran  Past antihypertensive medications:  verapamil, lisinopril  Past antidepressant medications:  sertraline , duloxetine , mirtazapine  Past anticonvulsant medications:  topiramate  (weight loss), divalproex  Past anti-CGRP:  none Other past therapies:  oxygen (years ago, doesn't remember if it helped)   History of TBI/concussion:  no Family history of headache:  no Family history of cerebral  aneurysm:  no  PAST MEDICAL HISTORY: Past Medical History:  Diagnosis Date   Anxiety    Benign  paroxysmal positional vertigo due to bilateral vestibular disorder 11/24/2023   Bipolar affective disorder (HCC)    CAD (coronary artery disease) 2006   CABG w/ LIMA-LAD, RIMA-Diag, SVG-OM1-OM2, R radial-PDA   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   Qualifier: Diagnosis of  By: Cyana Riggs     Diabetes mellitus type 2 in nonobese (HCC) 07/2016   Dyslipidemia    Headache(784.0)    History of pulmonary embolism 07/08/2020   HTN (hypertension)    Migraine    NSTEMI (non-ST elevated myocardial infarction) (HCC)    PAD (peripheral artery disease)    Peripheral arterial disease    Pulmonary embolus (HCC)    unprovoked    MEDICATIONS: Current Outpatient Medications on File Prior to Visit  Medication Sig Dispense Refill   ACCU-CHEK GUIDE TEST test strip USE UP TO FOUR TIMES DAILY AS DIRECTED 400 strip 6   Accu-Chek Softclix Lancets lancets USE UP TO FOUR TIMES DAILY AS DIRECTED 100 each 12   amitriptyline  (ELAVIL ) 25 MG tablet Take 1 tablet (25 mg total) by mouth at bedtime. 90 tablet 1   atorvastatin  (LIPITOR ) 80 MG tablet Take 1 tablet (80 mg total) by mouth daily. 90 tablet 3   BD PEN NEEDLE NANO 2ND GEN 32G X 4 MM MISC USE AS DIRECTED 300 each 1   blood glucose meter kit and supplies KIT Dispense based on patient and insurance preference. Use up to four times daily as directed. (FOR ICD-9 250.00, 250.01). 1 each 0   Cholecalciferol (VITAMIN D3) 75 MCG (3000 UT) TABS Take 1 tablet by mouth daily at 6 (six) AM. 30 tablet    cilostazol  (PLETAL ) 100 MG tablet TAKE 1 TABLET BY MOUTH TWICE A DAY BEFORE MEALS 180 tablet 3   clonazePAM  (KLONOPIN ) 1 MG tablet Take 1 tablet (1 mg total) by mouth 2 (two) times daily. 60 tablet 3   cloNIDine  HCl (KAPVAY ) 0.1 MG TB12 ER tablet Take 1 tablet (0.1 mg total) by mouth 3 (three) times daily. 270 tablet 0   clopidogrel  (PLAVIX ) 75 MG tablet TAKE 1 TABLET BY MOUTH EVERY DAY WITH BREAKFAST 90 tablet 3   Continuous Glucose Receiver (DEXCOM G6 RECEIVER) DEVI Use as  directed 1 each 0   Continuous Glucose Sensor (DEXCOM G7 SENSOR) MISC Use one sensor every 10 days 6 each 5   Continuous Glucose Transmitter (DEXCOM G6 TRANSMITTER) MISC Check blood sugars as directed 1 each 1   dapagliflozin  propanediol (FARXIGA ) 10 MG TABS tablet Take 1 tablet (10 mg total) by mouth daily before breakfast. 90 tablet 1   Fezolinetant  (VEOZAH ) 45 MG TABS Take 1 tablet (45 mg total) by mouth daily at 6 (six) AM. (Patient not taking: Reported on 05/15/2024)     insulin  degludec (TRESIBA  FLEXTOUCH) 100 UNIT/ML FlexTouch Pen Inject 8 Units into the skin daily.     isosorbide  mononitrate (IMDUR ) 120 MG 24 hr tablet TAKE 1 TABLET BY MOUTH EVERY DAY 90 tablet 3   meclizine  (ANTIVERT ) 25 MG tablet Take 1 tablet (25 mg total) by mouth 3 (three) times daily as needed for dizziness. 30 tablet 0   metoprolol  succinate (TOPROL -XL) 25 MG 24 hr tablet Take 0.5 tablets (12.5 mg total) by mouth daily. 45 tablet 3   mupirocin  ointment (BACTROBAN ) 2 % Apply 1 application. topically 2 (two) times daily. 30 g 2   nitroGLYCERIN  (NITROSTAT ) 0.4 MG SL tablet PLACE  1 TABLET UNDER THE TONGUE EVERY 5 MINUTES AS NEEDED FOR CHEST PAIN. MAX 3. CALL 911 25 tablet 3   OLANZapine  (ZYPREXA ) 15 MG tablet Take 1 tablet (15 mg total) by mouth at bedtime. 90 tablet 0   PARoxetine  (PAXIL ) 40 MG tablet Take 1 tablet (40 mg total) by mouth 2 (two) times daily. 180 tablet 0   ranolazine  (RANEXA ) 500 MG 12 hr tablet TAKE 1 TABLET BY MOUTH TWICE A DAY 180 tablet 1   Rimegepant Sulfate (NURTEC) 75 MG TBDP Take 1 tablet (75 mg total) by mouth every other day. 30 tablet 2   saxagliptin  HCl (ONGLYZA) 2.5 MG TABS tablet TAKE 1 TABLET BY MOUTH EVERY DAY 90 tablet 0   No current facility-administered medications on file prior to visit.    ALLERGIES: Allergies  Allergen Reactions   Metformin  And Related Nausea And Vomiting    FAMILY HISTORY: Family History  Problem Relation Age of Onset   Lupus Mother    Heart attack  Father    Diabetes Father    Hypertension Father    Diabetes Paternal Grandmother    Hypertension Paternal Grandmother    Stroke Neg Hx    Kidney disease Neg Hx    Hyperlipidemia Neg Hx    Sudden death Neg Hx       Objective:  Blood pressure 107/70, pulse 84, height 5' 4 (1.626 m), weight 127 lb (57.6 kg), last menstrual period 11/28/2009, SpO2 99%. General: No acute distress.  Patient appears well-groomed.   Head:  Normocephalic/atraumatic Eyes:  Fundi examined but not visualized Neck: supple, no paraspinal tenderness, full range of motion Heart:  Regular rate and rhythm Neurological Exam: alert and oriented.  Speech fluent and not dysarthric, language intact.  CN II-XII intact. Bulk and tone normal, muscle strength 5/5 throughout.  Sensation to light touch intact.  Deep tendon reflexes 2+ throughout, toes downgoing.  Finger to nose testing intact.  Gait normal, Romberg negative.   Juliene Dunnings, DO  CC: Eleanor Ponto, NP       "

## 2024-06-05 NOTE — Progress Notes (Signed)
 [facility] New Patient Evaluation and Consultation  Referring Provider: Diedre Rosaline BRAVO, MD PCP: Daryl Setter, NP Date of Service: 06/05/2024  SUBJECTIVE Chief Complaint: New Patient (Initial Visit) (hx done-Uterovaginal prolapse/Pt stated--frequency urinating, uterus is swollen.)  History of Present Illness: Bailey Greene is a 64 y.o. Black or African-American female seen in consultation at the request of Dr. Diedre for evaluation of pelvic organ prolapse.    Review of records significant for: Per [facility]; Stage 3 POP, cervix coming to introitus.  Desires to be sexually active.   RENAL US  01/13/24 CLINICAL DATA:  CKD stage 3   EXAM: RENAL / URINARY TRACT ULTRASOUND COMPLETE   COMPARISON:  None available   FINDINGS: Right Kidney:   Renal measurements: 11.7 x 4.0 x 3.6 cm = volume: 86 mL. Echogenicity within normal limits. No mass or hydronephrosis visualized.   Left Kidney:   Renal measurements: 11.4 x 6.3 x 4.5 cm = volume: 169 mL. Echogenicity within normal limits. No mass or hydronephrosis visualized. Simple LEFT renal cysts measuring 1.0 cm and 1.4 cm do not require dedicated imaging follow-up. Visualization of the LEFT kidney is limited due to adjacent shadowing ribs.   Bladder:   Appears normal for degree of bladder distention.   Other:   None.   IMPRESSION: No significant sonographic abnormality of the kidneys.     Electronically Signed   By: Aliene Lloyd M.D.   On: 01/13/2024 16:52  Urinary Symptoms: Does not leak urine.   Day time voids 12.  Nocturia: 2 times per night to void. Voiding dysfunction:  does not empty bladder well.  Patient does not use a catheter to empty bladder.  When urinating, patient feels difficulty starting urine stream and the need to urinate multiple times in a row Drinks: 80oz water, 8oz coffee AM, Occasional soda per day  UTIs: 0 UTI's in the last year.   Denies history of  urologic concerns.  No results found for the last 90 days.   Pelvic Organ Prolapse Symptoms:                  Patient Admits to a feeling of a bulge the vaginal area. It has been present for 1 years.  Patient Denies seeing a bulge.  This bulge is bothersome.  Bowel Symptom: Bowel movements: 1 time(s) per day Stool consistency: hard Straining: yes.  Splinting: no.  Incomplete evacuation: no.  Patient Denies accidental bowel leakage / fecal incontinence Bowel regimen: stool softener Last colonoscopy: Date 2021, Results Normal [facility] Colonoscopy   This patient has no relevant Health Maintenance data.     Sexual Function Sexually active: yes.  Sexual orientation: Straight Pain with sex: Yes, at the vaginal opening, deep in the pelvis, has discomfort due to prolapse, has discomfort due to dryness  Pelvic Pain Denies pelvic pain    Past Medical History:  Past Medical History:  Diagnosis Date   Anxiety    Benign paroxysmal positional vertigo due to bilateral vestibular disorder 11/24/2023   Bipolar affective disorder (HCC)    CAD (coronary artery disease) 2006   CABG w/ LIMA-LAD, RIMA-Diag, SVG-OM1-OM2, R radial-PDA   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   Qualifier: Diagnosis of  By: Marjie Riggs     Diabetes mellitus type 2 in nonobese (HCC) 07/2016   Dyslipidemia    Headache(784.0)    History of pulmonary embolism 07/08/2020   HTN (hypertension)    Migraine    NSTEMI (non-ST elevated myocardial infarction) (HCC)  PAD (peripheral artery disease)    Peripheral arterial disease    Pulmonary embolus (HCC)    unprovoked     Past Surgical History:   Past Surgical History:  Procedure Laterality Date   AORTOGRAM N/A 12/08/2022   Procedure: AORTOGRAM;  Surgeon: Magda Debby SAILOR, MD;  Location: [facility] OR;  Service: Vascular;  Laterality: N/A;   BUNIONECTOMY  08/2011   right foot   CARDIAC CATHETERIZATION  2007   severe native 3 v dz, all grafts patent (LIMA-LAD, RIMA-Diag,  SVG-OM1-OM2, R radial-PDA)   CESAREAN SECTION  1984   CORONARY ARTERY BYPASS GRAFT  2006    Coronary artery bypass grafting x5 with a left  internal  mammary to the left anterior descending coronary artery.  Free right  internal mammary to the diagonal coronary artery, sequential reverse  saphenous vein graft to the first and second obtuse marginal, right  artery bypass to the posterior descending coronary artery with endo-vein harvesting.   ENDARTERECTOMY FEMORAL Right 12/08/2022   Procedure: RIGHT COMMON FEMORAL ENDARTERECTOMY;  Surgeon: Magda Debby SAILOR, MD;  Location: [facility] OR;  Service: Vascular;  Laterality: Right;   INSERTION OF ILIAC STENT Bilateral 12/08/2022   Procedure: INSERTION OF BILATERAL ILIAC KISSING STENTS USING VIABAHN VBX STENTS;  Surgeon: Magda Debby SAILOR, MD;  Location: [facility] OR;  Service: Vascular;  Laterality: Bilateral;   LEFT HEART CATH AND CORS/GRAFTS ANGIOGRAPHY N/A 05/05/2021   Procedure: LEFT HEART CATH AND CORS/GRAFTS ANGIOGRAPHY;  Surgeon: Anner Alm ORN, MD;  Location: [facility] INVASIVE [facility];  Service: Cardiovascular;  Laterality: N/A;   ULTRASOUND GUIDANCE FOR VASCULAR ACCESS Left 12/08/2022   Procedure: ULTRASOUND GUIDANCE FOR VASCULAR ACCESS OF LEFT FEMORAL ARTERY;  Surgeon: Magda Debby SAILOR, MD;  Location: [facility] OR;  Service: Vascular;  Laterality: Left;   UMBILICAL HERNIA REPAIR N/A 01/16/2021   Procedure: UMBILICAL  HERNIA REPAIR;  Surgeon: Dasie Leonor CROME, MD;  Location: [facility] OR;  Service: General;  Laterality: N/A;     Past OB/GYN History: G1 P1 G1P1001 Vaginal deliveries: 0,  Forceps/ Vacuum deliveries: 0, Cesarean section: 1 Menopausal: Yes, at age 87 Contraception: None. Last pap smear was May 2025.  Any history of abnormal pap smears: no. [facility] PAP   This patient has no relevant Health Maintenance data.     Medications: Patient has a current medication list which includes the following prescription(s): accu-chek guide test, accu-chek softclix lancets,  amitriptyline , atorvastatin , bd pen needle nano 2nd gen, blood glucose meter kit and supplies, vitamin d3, cilostazol , clonazepam , clonidine  hcl, dexcom g6 receiver, dexcom g7 sensor, dexcom g6 transmitter, dapagliflozin  propanediol, diazepam , [START ON 06/07/2024] estradiol , tresiba  flextouch, isosorbide  mononitrate, metoprolol  succinate, nitroglycerin , olanzapine , paroxetine , ranolazine , saxagliptin  hcl, clopidogrel , meclizine , and nurtec.   Allergies: Patient is allergic to metformin  and related.   Social History: Social History[1]  Relationship status: married Patient lives with husband.   Patient is employed as a armed forces training and education officer. Regular exercise: Yes: Walking History of abuse: No  Family History:   Family History  Problem Relation Age of Onset   Lupus Mother    Heart attack Father    Diabetes Father    Hypertension Father    Diabetes Paternal Grandmother    Hypertension Paternal Grandmother    Stroke Neg Hx    Kidney disease Neg Hx    Hyperlipidemia Neg Hx    Sudden death Neg Hx      Review of Systems: Review of Systems  Constitutional:  Positive for malaise/fatigue and weight loss. Negative for chills  and fever.  Respiratory:  Positive for shortness of breath. Negative for cough.   Cardiovascular:  Positive for chest pain, palpitations and leg swelling.  Gastrointestinal:  Positive for abdominal pain. Negative for blood in stool, constipation and diarrhea.  Skin:  Negative for rash.  Neurological:  Positive for dizziness, weakness and headaches.  Endo/Heme/Allergies:  Bruises/bleeds easily.       +Hot Flashes  Psychiatric/Behavioral:  Positive for depression. Negative for suicidal ideas. The patient is nervous/anxious.      OBJECTIVE Physical Exam: Vitals:   06/05/24 1017  BP: 121/77  Pulse: 72    Physical Exam Vitals reviewed. Exam conducted with a chaperone present.  Constitutional:      Appearance: Normal appearance.  Pulmonary:     Effort:  Pulmonary effort is normal.  Abdominal:     Palpations: Abdomen is soft.  Neurological:     General: No focal deficit present.     Mental Status: She is alert and oriented to person, place, and time.  Psychiatric:        Mood and Affect: Mood normal.        Behavior: Behavior normal. Behavior is cooperative.        Thought Content: Thought content normal.      GU / Detailed Urogynecologic Evaluation:  Pelvic Exam: Normal external female genitalia; Bartholin's and Skene's glands normal in appearance; urethral meatus normal in appearance, no urethral masses or discharge.   CST: negative  Speculum exam reveals normal vaginal mucosa with atrophy. Cervix normal appearance. Uterus normal single, nontender. Adnexa normal adnexa.     With apex supported, anterior compartment defect was reduced  Pelvic floor strength I/V  Pelvic floor musculature: Right levator tender, Right obturator tender, Left levator tender, Left obturator tender  POP-Q:   POP-Q  -1                                            Aa   -1                                           Ba  5                                              C   4                                            Gh  2.5                                            Pb  8                                            tvl   -3  Ap  -3                                            Bp  -3                                              D      Rectal Exam:  Normal external exam  Post-Void Residual (PVR) by Bladder Scan: In order to evaluate bladder emptying, we discussed obtaining a postvoid residual and patient agreed to this procedure.  Procedure: The ultrasound unit was placed on the patient's abdomen in the suprapubic region after the patient had voided.    Post Void Residual - 06/05/24 1022       Post Void Residual   Post Void Residual 33 mL           Laboratory Results: Lab Results   Component Value Date   COLORU yellow 06/05/2024   CLARITYU clear 06/05/2024   GLUCOSEUR =500 (A) 06/05/2024   BILIRUBINUR negative 06/05/2024   SPECGRAV 1.015 06/05/2024   RBCUR negative 06/05/2024   PHUR 6.0 06/05/2024   PROTEINUR NEGATIVE 11/30/2022   UROBILINOGEN 0.2 06/05/2024   LEUKOCYTESUR Negative 06/05/2024    Lab Results  Component Value Date   CREATININE 1.20 02/15/2024   CREATININE 1.18 (H) 11/17/2023   CREATININE 1.22 (H) 08/15/2023    Lab Results  Component Value Date   HGBA1C 5.9 12/05/2023    Lab Results  Component Value Date   HGB 12.6 11/17/2023     ASSESSMENT AND PLAN Bailey Greene is a 64 y.o. with:  1. 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 Pelvic floor dysfunction   3. Urinary frequency    Patient has complete uterine prolapse. We discussed surgical options including vaginal approach and abdominal approach with hysterectomy. She does wish to remain sexually active. We discussed with her pelvic floor dysfunction, anxiety, depression and diabetic hx she would not be the best candidate for a mesh procedure, which she agrees with. She is most interested in a vaginal approach she believes and will plan to follow up to discuss surgical repair with surgeon after undergoing UDS.  Patient has significant pelvic floor muscle pain. She is tender on exam at the opening and obturator muscles. She flinches during manual exam. We discussed that fixing her prolapse does not mean her pelvic floor pain/dysfunction will improve. We discussed pelvic floor PT, which she does not have time for at the moment due to her stressful job, but she is hoping to retire this year. I encouraged her to start vaginal valium  nightly for the next two weeks for some muscle tension relief to see if this is helpful.  Will evaluate bladder more closely with UDS. She has reported OAB, but also reports she drinks 80oz of water daily to help with her A1c management.  Denies SUI, but could have occult incontinence. Will evaluate further with UDS.   Patient to follow up for UDS and then can follow up with surgeon for planning of prolapse repair.    Cyndie Woodbeck G Zhana Jeangilles, NP      [1]  Social History Tobacco Use   Smoking status: Former    Current packs/day: 0.00    Types: Cigarettes  Start date: 09/12/1996    Quit date: 09/12/2021    Years since quitting: 2.7   Smokeless tobacco: Never   Tobacco comments:    0.5 pack cigarettes  per month per patient   Vaping Use   Vaping status: Never Used  Substance Use Topics   Alcohol use: No    Alcohol/week: 0.0 standard drinks of alcohol   Drug use: Never

## 2024-06-06 ENCOUNTER — Ambulatory Visit: Admitting: Neurology

## 2024-06-06 VITALS — BP 107/70 | HR 84 | Ht 64.0 in | Wt 127.0 lb

## 2024-06-06 DIAGNOSIS — G44019 Episodic cluster headache, not intractable: Secondary | ICD-10-CM

## 2024-06-06 MED ORDER — NONFORMULARY OR COMPOUNDED ITEM: Status: AC

## 2024-06-06 NOTE — Patient Instructions (Signed)
 Hold off on starting a daily preventative medication as it seems to be decreasing Go to [facility] on [address] for lidocaine  nasal spray to take as needed for headache attack Follow up in 4 months.

## 2024-06-06 NOTE — Progress Notes (Signed)
 Spoke to Energy East CorporationLibrarian, Academic) Lidocaine  Nasal Spray called in.

## 2024-06-07 ENCOUNTER — Encounter: Payer: Self-pay | Admitting: Neurology

## 2024-06-17 ENCOUNTER — Other Ambulatory Visit: Payer: Self-pay | Admitting: Cardiology

## 2024-06-17 DIAGNOSIS — I10 Essential (primary) hypertension: Secondary | ICD-10-CM

## 2024-06-17 DIAGNOSIS — I25119 Atherosclerotic heart disease of native coronary artery with unspecified angina pectoris: Secondary | ICD-10-CM

## 2024-06-23 ENCOUNTER — Other Ambulatory Visit: Payer: Self-pay | Admitting: Family

## 2024-06-25 ENCOUNTER — Telehealth: Payer: Self-pay | Admitting: Vascular Surgery

## 2024-06-25 NOTE — Progress Notes (Unsigned)
 " HISTORY AND PHYSICAL     CC:  follow up. Requesting Provider:  Daryl Setter, NP  HPI: This is a 64 y.o. female who is here today for follow up for PAD.  Pt has hx of angiogram with bilateral CIA stenting, right femoral endarterectomy and bovine patch angioplasty on 12/08/2022 by Dr. Magda for aortoiliac occlusive disease with BLE claudication.  Pt was last seen 12/20/2023 and at that time, she had been walking well but she was ambulating less due to Migraines.  She was not having any claudication, rest pain or non healing wounds.  She was continued on asa/statin/plavix /pletal .  The pt returns today for follow up.  ***  The pt is on a statin for cholesterol management.    The pt is not on an aspirin .    Other AC:  none The pt is on BB for hypertension.  The pt is  on diabetic medication. Tobacco hx:  former  Pt does *** have family hx of AAA.  Past Medical History:  Diagnosis Date   Anxiety    Benign paroxysmal positional vertigo due to bilateral vestibular disorder 11/24/2023   Bipolar affective disorder (HCC)    CAD (coronary artery disease) 2006   CABG w/ LIMA-LAD, RIMA-Diag, SVG-OM1-OM2, R radial-PDA   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   Qualifier: Diagnosis of  By: Lakysha Riggs     Diabetes mellitus type 2 in nonobese (HCC) 07/2016   Dyslipidemia    Headache(784.0)    History of pulmonary embolism 07/08/2020   HTN (hypertension)    Migraine    NSTEMI (non-ST elevated myocardial infarction) (HCC)    PAD (peripheral artery disease)    Peripheral arterial disease    Pulmonary embolus (HCC)    unprovoked    Past Surgical History:  Procedure Laterality Date   AORTOGRAM N/A 12/08/2022   Procedure: AORTOGRAM;  Surgeon: Magda Debby SAILOR, MD;  Location: [facility] OR;  Service: Vascular;  Laterality: N/A;   BUNIONECTOMY  08/2011   right foot   CARDIAC CATHETERIZATION  2007   severe native 3 v dz, all grafts patent (LIMA-LAD, RIMA-Diag, SVG-OM1-OM2, R radial-PDA)   CESAREAN  SECTION  1984   CORONARY ARTERY BYPASS GRAFT  2006    Coronary artery bypass grafting x5 with a left  internal  mammary to the left anterior descending coronary artery.  Free right  internal mammary to the diagonal coronary artery, sequential reverse  saphenous vein graft to the first and second obtuse marginal, right  artery bypass to the posterior descending coronary artery with endo-vein harvesting.   ENDARTERECTOMY FEMORAL Right 12/08/2022   Procedure: RIGHT COMMON FEMORAL ENDARTERECTOMY;  Surgeon: Magda Debby SAILOR, MD;  Location[facility] OR;  Service: Vascular;  Laterality: Right;   INSERTION OF ILIAC STENT Bilateral 12/08/2022   Procedure: INSERTION OF BILATERAL ILIAC KISSING STENTS USING VIABAHN VBX STENTS;  Surgeon: Magda Debby SAILOR, MD;  Location: [facility] OR;  Service: Vascular;  Laterality: Bilateral;   LEFT HEART CATH AND CORS/GRAFTS ANGIOGRAPHY N/A 05/05/2021   Procedure: LEFT HEART CATH AND CORS/GRAFTS ANGIOGRAPHY;  Surgeon: Anner Alm ORN, MD;  Location: [facility] INVASIVE CV LAB;  Service: Cardiovascular;  Laterality: N/A;   ULTRASOUND GUIDANCE FOR VASCULAR ACCESS Left 12/08/2022   Procedure: ULTRASOUND GUIDANCE FOR VASCULAR ACCESS OF LEFT FEMORAL ARTERY;  Surgeon: Magda Debby SAILOR, MD;  Location: [facility] OR;  Service: Vascular;  Laterality: Left;   UMBILICAL HERNIA REPAIR N/A 01/16/2021   Procedure: UMBILICAL  HERNIA REPAIR;  Surgeon: Dasie Leonor CROME, MD;  Location: [facility]  OR;  Service: General;  Laterality: N/A;    Allergies[1]  Current Outpatient Medications  Medication Sig Dispense Refill   ACCU-CHEK GUIDE TEST test strip USE UP TO FOUR TIMES DAILY AS DIRECTED 400 strip 6   Accu-Chek Softclix Lancets lancets USE UP TO FOUR TIMES DAILY AS DIRECTED 100 each 12   amitriptyline  (ELAVIL ) 25 MG tablet Take 1 tablet (25 mg total) by mouth at bedtime. 90 tablet 1   atorvastatin  (LIPITOR ) 80 MG tablet Take 1 tablet (80 mg total) by mouth daily. 90 tablet 3   BD PEN NEEDLE NANO 2ND GEN 32G X 4 MM MISC USE AS  DIRECTED 300 each 1   blood glucose meter kit and supplies KIT Dispense based on patient and insurance preference. Use up to four times daily as directed. (FOR ICD-9 250.00, 250.01). 1 each 0   Cholecalciferol (VITAMIN D3) 75 MCG (3000 UT) TABS Take 1 tablet by mouth daily at 6 (six) AM. 30 tablet    cilostazol  (PLETAL ) 100 MG tablet TAKE 1 TABLET BY MOUTH TWICE A DAY BEFORE MEALS 180 tablet 3   clonazePAM  (KLONOPIN ) 1 MG tablet Take 1 tablet (1 mg total) by mouth 2 (two) times daily. 60 tablet 3   cloNIDine  HCl (KAPVAY ) 0.1 MG TB12 ER tablet Take 1 tablet (0.1 mg total) by mouth 3 (three) times daily. 270 tablet 0   Continuous Glucose Receiver (DEXCOM G6 RECEIVER) DEVI Use as directed 1 each 0   Continuous Glucose Sensor (DEXCOM G7 SENSOR) MISC Use one sensor every 10 days 6 each 5   Continuous Glucose Transmitter (DEXCOM G6 TRANSMITTER) MISC Check blood sugars as directed 1 each 1   dapagliflozin  propanediol (FARXIGA ) 10 MG TABS tablet Take 1 tablet (10 mg total) by mouth daily before breakfast. 90 tablet 1   diazepam  (VALIUM ) 5 MG tablet Place 1 tablet vaginally nightly as needed for muscle spasm/ pelvic pain. (Patient not taking: Reported on 06/06/2024) 14 tablet 0   estradiol  (ESTRACE ) 0.01 % CREA vaginal cream Place 0.5 g vaginally 2 (two) times a week. Place 0.5g nightly for two weeks then twice a week after 42.5 g 11   insulin  degludec (TRESIBA  FLEXTOUCH) 100 UNIT/ML FlexTouch Pen Inject 8 Units into the skin daily.     isosorbide  mononitrate (IMDUR ) 120 MG 24 hr tablet TAKE 1 TABLET BY MOUTH EVERY DAY 90 tablet 3   meclizine  (ANTIVERT ) 25 MG tablet Take 1 tablet (25 mg total) by mouth 3 (three) times daily as needed for dizziness. 30 tablet 0   metoprolol  succinate (TOPROL -XL) 25 MG 24 hr tablet TAKE 1 TABLET (25 MG TOTAL) BY MOUTH DAILY. 90 tablet 3   nitroGLYCERIN  (NITROSTAT ) 0.4 MG SL tablet PLACE 1 TABLET UNDER THE TONGUE EVERY 5 MINUTES AS NEEDED FOR CHEST PAIN. MAX 3. CALL 911 (Patient  taking differently: Place 0.4 mg under the tongue every 5 (five) minutes as needed.) 25 tablet 3   OLANZapine  (ZYPREXA ) 15 MG tablet Take 1 tablet (15 mg total) by mouth at bedtime. 90 tablet 0   PARoxetine  (PAXIL ) 40 MG tablet Take 1 tablet (40 mg total) by mouth 2 (two) times daily. 180 tablet 0   ranolazine  (RANEXA ) 500 MG 12 hr tablet TAKE 1 TABLET BY MOUTH TWICE A DAY 180 tablet 3   Rimegepant Sulfate (NURTEC) 75 MG TBDP Take 1 tablet (75 mg total) by mouth every other day. 30 tablet 2   saxagliptin  HCl (ONGLYZA) 2.5 MG TABS tablet TAKE 1 TABLET BY MOUTH EVERY DAY  90 tablet 0   Current Facility-Administered Medications  Medication Dose Route Frequency Provider Last Rate Last Admin   NONFORMULARY OR COMPOUNDED ITEM   Nasal UD Skeet Juliene SAUNDERS, DO        Family History  Problem Relation Age of Onset   Lupus Mother    Heart attack Father    Diabetes Father    Hypertension Father    Diabetes Paternal Grandmother    Hypertension Paternal Grandmother    Stroke Neg Hx    Kidney disease Neg Hx    Hyperlipidemia Neg Hx    Sudden death Neg Hx     Social History   Socioeconomic History   Marital status: Legally Separated    Spouse name: Not on file   Number of children: Not on file   Years of education: Not on file   Highest education level: Some college, no degree  Occupational History   Occupation: armed forces training and education officer  Tobacco Use   Smoking status: Former    Current packs/day: 0.00    Types: Cigarettes    Start date: 09/12/1996    Quit date: 09/12/2021    Years since quitting: 2.7   Smokeless tobacco: Never   Tobacco comments:    0.5 pack cigarettes  per month per patient   Vaping Use   Vaping status: Never Used  Substance and Sexual Activity   Alcohol use: No    Alcohol/week: 0.0 standard drinks of alcohol   Drug use: Never   Sexual activity: Yes    Partners: Male    Comment: married  Other Topics Concern   Not on file  Social History Narrative   Regular  exercise:  3 days weeklyCaffeine    Use:  1 soda daily   One child biological daughter born in 65 and an adopted niece.[employer]- Engineer, technical sales   Married- may be divorcing.    Left handed   Social Drivers of Health   Tobacco Use: Medium Risk (06/07/2024)   Patient History    Smoking Tobacco Use: Former    Smokeless Tobacco Use: Never    Passive Exposure: Not on file  Financial Resource Strain: Medium Risk (02/15/2024)   Overall Financial Resource Strain (CARDIA)    Difficulty of Paying Living Expenses: Somewhat hard  Food Insecurity: Food Insecurity Present (02/15/2024)   Epic    Worried About Programme Researcher, Broadcasting/film/video in the Last Year: Sometimes true    Ran Out of Food in the Last Year: Sometimes true  Transportation Needs: No Transportation Needs (02/15/2024)   Epic    Lack of Transportation (Medical): No    Lack of Transportation (Non-Medical): No  Physical Activity: Insufficiently Active (02/15/2024)   Exercise Vital Sign    Days of Exercise per Week: 4 days    Minutes of Exercise per Session: 20 min  Stress: Stress Concern Present (02/15/2024)   [employer] of Occupational Health - Occupational Stress Questionnaire    Feeling of Stress: Rather much  Social Connections: Socially Integrated (02/15/2024)   Social Connection and Isolation Panel    Frequency of Communication with Friends and Family: More than three times a week    Frequency of Social Gatherings with Friends and Family: Not on file    Attends Religious Services: More than 4 times per year    Active Member of [employer] or Organizations: Yes    Attends Engineer, Structural: More than 4 times per year    Marital Status: Married  Catering Manager Violence:  Not on file  Depression (PHQ2-9): Medium Risk (02/01/2023)   Depression (PHQ2-9)    PHQ-2 Score: 9  Alcohol Screen: Not on file  Housing: Unknown (04/19/2024)   Received from [facility]   Epic    Unable to Pay for Housing in the  Last Year: Not on file    Number of Times Moved in the Last Year: Not on file    At any time in the past 12 months, were you homeless or living in a shelter (including now)?: No  Recent Concern: Housing - High Risk (02/15/2024)   Epic    Unable to Pay for Housing in the Last Year: Yes    Number of Times Moved in the Last Year: 1    Homeless in the Last Year: No  Utilities: Not on file  Health Literacy: Not on file     REVIEW OF SYSTEMS:  *** [X]  denotes positive finding, [ ]  denotes negative finding Cardiac  Comments:  Chest pain or chest pressure:    Shortness of breath upon exertion:    Short of breath when lying flat:    Irregular heart rhythm:        Vascular    Pain in calf, thigh, or hip brought on by ambulation:    Pain in feet at night that wakes you up from your sleep:     Blood clot in your veins:    Leg swelling:         Pulmonary    Oxygen at home:    Productive cough:     Wheezing:         Neurologic    Sudden weakness in arms or legs:     Sudden numbness in arms or legs:     Sudden onset of difficulty speaking or slurred speech:    Temporary loss of vision in one eye:     Problems with dizziness:         Gastrointestinal    Blood in stool:     Vomited blood:         Genitourinary    Burning when urinating:     Blood in urine:        Psychiatric    Major depression:         Hematologic    Bleeding problems:    Problems with blood clotting too easily:        Skin    Rashes or ulcers:        Constitutional    Fever or chills:      PHYSICAL EXAMINATION:  ***  General:  WDWN in NAD; vital signs documented above Gait: Not observed HENT: WNL, normocephalic Pulmonary: normal non-labored breathing , without wheezing Cardiac: {Desc; regular/irreg:14544} HR, {With/Without:20273} carotid bruit*** Abdomen: soft, NT; aortic pulse is *** palpable Skin: {With/Without:20273} rashes Vascular Exam/Pulses:  Right Left  Radial {Exam; arterial pulse  strength 0-4:30167} {Exam; arterial pulse strength 0-4:30167}  Femoral {Exam; arterial pulse strength 0-4:30167} {Exam; arterial pulse strength 0-4:30167}  Popliteal {Exam; arterial pulse strength 0-4:30167} {Exam; arterial pulse strength 0-4:30167}  DP {Exam; arterial pulse strength 0-4:30167} {Exam; arterial pulse strength 0-4:30167}  PT {Exam; arterial pulse strength 0-4:30167} {Exam; arterial pulse strength 0-4:30167}  Peroneal *** ***   Extremities: {With/Without:20273} ischemic changes, {With/Without:20273} Gangrene , {With/Without:20273} cellulitis; {With/Without:20273} open wounds Musculoskeletal: no muscle wasting or atrophy  Neurologic: A&O X 3 Psychiatric:  The pt has {Desc; normal/abnormal:11317::Normal} affect.   Non-Invasive Vascular Imaging:   ABI's/TBI's on 06/26/2024:  Right:  *** - Great toe pressure: *** Left:  *** - Great toe pressure: ***  Arterial duplex on 06/26/2024: ***  Previous ABI's/TBI's on 12/20/2023: Right:  0.60/0.45 - Great toe pressure: 50 Left:  0.89/0.63 - Great toe pressure:  71  Previous arterial duplex on 12/20/2023: Patent bilateral CIA stent without evidence of stenosis     ASSESSMENT/PLAN:: 64 y.o. female here for follow up for PAD with hx of angiogram with bilateral CIA stenting, right femoral endarterectomy and bovine patch angioplasty on 12/08/2022 by Dr. Magda for aortoiliac occlusive disease with BLE claudication.   -*** -continue *** -discussed importance of increased walking daily -pt will f/u in *** with ***.   Bailey Greene, [facility] [phone]  Clinic MD:   Gretta    [1]  Allergies Allergen Reactions   Metformin  And Related Nausea And Vomiting   "

## 2024-06-26 ENCOUNTER — Ambulatory Visit

## 2024-06-26 ENCOUNTER — Ambulatory Visit (HOSPITAL_COMMUNITY)

## 2024-06-29 ENCOUNTER — Telehealth: Payer: Self-pay | Admitting: *Deleted

## 2024-06-29 NOTE — Telephone Encounter (Signed)
 Pt called back and is scheduled. KD

## 2024-06-29 NOTE — Telephone Encounter (Signed)
 LM for pt.  Dr Marilynne reviewed pt's chart and stated that she could have a simple CMG instead of having the urodynamics performed. Please schedule with Dr Marilynne when the patient calls back. KD CMA

## 2024-07-04 ENCOUNTER — Encounter: Admitting: Obstetrics and Gynecology

## 2024-07-06 ENCOUNTER — Other Ambulatory Visit (HOSPITAL_BASED_OUTPATIENT_CLINIC_OR_DEPARTMENT_OTHER): Payer: Self-pay

## 2024-07-06 ENCOUNTER — Other Ambulatory Visit: Payer: Self-pay

## 2024-07-12 ENCOUNTER — Ambulatory Visit: Admitting: Obstetrics and Gynecology

## 2024-07-31 ENCOUNTER — Ambulatory Visit: Admitting: Family

## 2024-08-03 ENCOUNTER — Ambulatory Visit (HOSPITAL_COMMUNITY)

## 2024-08-03 ENCOUNTER — Ambulatory Visit

## 2024-08-14 ENCOUNTER — Ambulatory Visit: Admitting: Psychiatry

## 2024-10-16 ENCOUNTER — Ambulatory Visit: Payer: Self-pay | Admitting: Neurology
